# Patient Record
Sex: Male | Born: 1950
Health system: Southern US, Community
[De-identification: ages and names within clinical notes are randomized; demographics above are authoritative.]

## PROBLEM LIST (undated history)

## (undated) DIAGNOSIS — I10 Essential (primary) hypertension: Secondary | ICD-10-CM

## (undated) DIAGNOSIS — F1011 Alcohol abuse, in remission: Secondary | ICD-10-CM

## (undated) DIAGNOSIS — E538 Deficiency of other specified B group vitamins: Secondary | ICD-10-CM

## (undated) DIAGNOSIS — I471 Supraventricular tachycardia, unspecified: Secondary | ICD-10-CM

## (undated) DIAGNOSIS — F1910 Other psychoactive substance abuse, uncomplicated: Secondary | ICD-10-CM

## (undated) DIAGNOSIS — D649 Anemia, unspecified: Secondary | ICD-10-CM

## (undated) DIAGNOSIS — K279 Peptic ulcer, site unspecified, unspecified as acute or chronic, without hemorrhage or perforation: Secondary | ICD-10-CM

## (undated) DIAGNOSIS — R160 Hepatomegaly, not elsewhere classified: Secondary | ICD-10-CM

## (undated) DIAGNOSIS — M25569 Pain in unspecified knee: Secondary | ICD-10-CM

## (undated) DIAGNOSIS — K219 Gastro-esophageal reflux disease without esophagitis: Secondary | ICD-10-CM

## (undated) DIAGNOSIS — D3A092 Benign carcinoid tumor of the stomach: Secondary | ICD-10-CM

## (undated) DIAGNOSIS — Z8739 Personal history of other diseases of the musculoskeletal system and connective tissue: Secondary | ICD-10-CM

## (undated) DIAGNOSIS — E039 Hypothyroidism, unspecified: Secondary | ICD-10-CM

## (undated) DIAGNOSIS — G8929 Other chronic pain: Secondary | ICD-10-CM

## (undated) DIAGNOSIS — K5730 Diverticulosis of large intestine without perforation or abscess without bleeding: Secondary | ICD-10-CM

## (undated) HISTORY — DX: Diverticulosis of large intestine without perforation or abscess without bleeding: K57.30

## (undated) HISTORY — DX: Hepatomegaly, not elsewhere classified: R16.0

## (undated) HISTORY — DX: Deficiency of other specified B group vitamins: E53.8

## (undated) HISTORY — DX: Gastro-esophageal reflux disease without esophagitis: K21.9

## (undated) HISTORY — PX: JOINT REPLACEMENT: SHX530

## (undated) HISTORY — DX: Supraventricular tachycardia, unspecified: I47.10

## (undated) HISTORY — PX: HAND SURGERY: SHX662

## (undated) HISTORY — DX: Other psychoactive substance abuse, uncomplicated: F19.10

## (undated) HISTORY — PX: COLONOSCOPY: SHX174

## (undated) HISTORY — DX: Hypothyroidism, unspecified: E03.9

## (undated) HISTORY — DX: Alcohol abuse, in remission: F10.11

## (undated) HISTORY — PX: CHOLECYSTECTOMY: SHX55

## (undated) HISTORY — DX: Essential (primary) hypertension: I10

## (undated) HISTORY — DX: Anemia, unspecified: D64.9

## (undated) HISTORY — DX: Peptic ulcer, site unspecified, unspecified as acute or chronic, without hemorrhage or perforation: K27.9

## (undated) HISTORY — DX: Personal history of other diseases of the musculoskeletal system and connective tissue: Z87.39

## (undated) HISTORY — DX: Benign carcinoid tumor of the stomach: D3A.092

## (undated) HISTORY — PX: REPLACEMENT TOTAL KNEE BILATERAL: SUR1225

---

## 2001-01-19 ENCOUNTER — Inpatient Hospital Stay (HOSPITAL_COMMUNITY): Admission: EM | Admit: 2001-01-19 | Discharge: 2001-01-21 | Payer: Self-pay | Admitting: *Deleted

## 2001-01-20 ENCOUNTER — Encounter: Payer: Self-pay | Admitting: Family Medicine

## 2001-01-24 ENCOUNTER — Encounter: Payer: Self-pay | Admitting: Family Medicine

## 2001-01-24 ENCOUNTER — Observation Stay (HOSPITAL_COMMUNITY): Admission: AD | Admit: 2001-01-24 | Discharge: 2001-01-25 | Payer: Self-pay | Admitting: Family Medicine

## 2001-04-22 ENCOUNTER — Emergency Department (HOSPITAL_COMMUNITY): Admission: EM | Admit: 2001-04-22 | Discharge: 2001-04-22 | Payer: Self-pay | Admitting: Emergency Medicine

## 2001-05-31 ENCOUNTER — Emergency Department (HOSPITAL_COMMUNITY): Admission: EM | Admit: 2001-05-31 | Discharge: 2001-05-31 | Payer: Self-pay | Admitting: *Deleted

## 2001-05-31 ENCOUNTER — Encounter: Payer: Self-pay | Admitting: *Deleted

## 2001-10-13 ENCOUNTER — Emergency Department (HOSPITAL_COMMUNITY): Admission: EM | Admit: 2001-10-13 | Discharge: 2001-10-13 | Payer: Self-pay | Admitting: Internal Medicine

## 2001-10-13 ENCOUNTER — Encounter: Payer: Self-pay | Admitting: Emergency Medicine

## 2001-11-29 ENCOUNTER — Emergency Department (HOSPITAL_COMMUNITY): Admission: EM | Admit: 2001-11-29 | Discharge: 2001-11-30 | Payer: Self-pay | Admitting: *Deleted

## 2001-12-31 ENCOUNTER — Emergency Department (HOSPITAL_COMMUNITY): Admission: EM | Admit: 2001-12-31 | Discharge: 2001-12-31 | Payer: Self-pay | Admitting: Emergency Medicine

## 2002-01-01 ENCOUNTER — Emergency Department (HOSPITAL_COMMUNITY): Admission: EM | Admit: 2002-01-01 | Discharge: 2002-01-01 | Payer: Self-pay | Admitting: *Deleted

## 2002-01-07 ENCOUNTER — Emergency Department (HOSPITAL_COMMUNITY): Admission: EM | Admit: 2002-01-07 | Discharge: 2002-01-07 | Payer: Self-pay | Admitting: *Deleted

## 2002-01-14 ENCOUNTER — Encounter: Payer: Self-pay | Admitting: *Deleted

## 2002-01-14 ENCOUNTER — Emergency Department (HOSPITAL_COMMUNITY): Admission: EM | Admit: 2002-01-14 | Discharge: 2002-01-14 | Payer: Self-pay | Admitting: *Deleted

## 2002-03-05 ENCOUNTER — Inpatient Hospital Stay (HOSPITAL_COMMUNITY): Admission: EM | Admit: 2002-03-05 | Discharge: 2002-03-08 | Payer: Self-pay | Admitting: Emergency Medicine

## 2002-05-01 ENCOUNTER — Encounter: Payer: Self-pay | Admitting: Emergency Medicine

## 2002-05-01 ENCOUNTER — Emergency Department (HOSPITAL_COMMUNITY): Admission: EM | Admit: 2002-05-01 | Discharge: 2002-05-01 | Payer: Self-pay | Admitting: *Deleted

## 2002-05-18 ENCOUNTER — Encounter: Payer: Self-pay | Admitting: Orthopaedic Surgery

## 2002-05-18 ENCOUNTER — Inpatient Hospital Stay (HOSPITAL_COMMUNITY): Admission: RE | Admit: 2002-05-18 | Discharge: 2002-05-20 | Payer: Self-pay | Admitting: Orthopaedic Surgery

## 2002-06-21 ENCOUNTER — Encounter: Payer: Self-pay | Admitting: Emergency Medicine

## 2002-06-21 ENCOUNTER — Inpatient Hospital Stay (HOSPITAL_COMMUNITY): Admission: EM | Admit: 2002-06-21 | Discharge: 2002-06-23 | Payer: Self-pay | Admitting: Emergency Medicine

## 2002-06-22 ENCOUNTER — Encounter: Payer: Self-pay | Admitting: Family Medicine

## 2002-06-27 ENCOUNTER — Observation Stay (HOSPITAL_COMMUNITY): Admission: RE | Admit: 2002-06-27 | Discharge: 2002-06-28 | Payer: Self-pay | Admitting: General Surgery

## 2002-12-08 ENCOUNTER — Emergency Department (HOSPITAL_COMMUNITY): Admission: EM | Admit: 2002-12-08 | Discharge: 2002-12-08 | Payer: Self-pay | Admitting: Emergency Medicine

## 2003-01-04 ENCOUNTER — Emergency Department (HOSPITAL_COMMUNITY): Admission: EM | Admit: 2003-01-04 | Discharge: 2003-01-04 | Payer: Self-pay | Admitting: Internal Medicine

## 2003-01-04 ENCOUNTER — Encounter: Payer: Self-pay | Admitting: Internal Medicine

## 2003-01-04 ENCOUNTER — Ambulatory Visit (HOSPITAL_COMMUNITY): Admission: RE | Admit: 2003-01-04 | Discharge: 2003-01-04 | Payer: Self-pay | Admitting: Internal Medicine

## 2003-01-07 ENCOUNTER — Emergency Department (HOSPITAL_COMMUNITY): Admission: EM | Admit: 2003-01-07 | Discharge: 2003-01-07 | Payer: Self-pay | Admitting: *Deleted

## 2003-03-10 ENCOUNTER — Encounter: Payer: Self-pay | Admitting: Emergency Medicine

## 2003-03-10 ENCOUNTER — Emergency Department (HOSPITAL_COMMUNITY): Admission: EM | Admit: 2003-03-10 | Discharge: 2003-03-10 | Payer: Self-pay | Admitting: Emergency Medicine

## 2003-03-11 ENCOUNTER — Emergency Department (HOSPITAL_COMMUNITY): Admission: EM | Admit: 2003-03-11 | Discharge: 2003-03-11 | Payer: Self-pay | Admitting: Internal Medicine

## 2003-03-12 ENCOUNTER — Ambulatory Visit (HOSPITAL_COMMUNITY): Admission: RE | Admit: 2003-03-12 | Discharge: 2003-03-12 | Payer: Self-pay | Admitting: Emergency Medicine

## 2003-03-12 ENCOUNTER — Encounter: Payer: Self-pay | Admitting: Emergency Medicine

## 2003-06-09 ENCOUNTER — Emergency Department (HOSPITAL_COMMUNITY): Admission: EM | Admit: 2003-06-09 | Discharge: 2003-06-09 | Payer: Self-pay | Admitting: Emergency Medicine

## 2003-06-22 ENCOUNTER — Emergency Department (HOSPITAL_COMMUNITY): Admission: EM | Admit: 2003-06-22 | Discharge: 2003-06-22 | Payer: Self-pay | Admitting: Emergency Medicine

## 2003-07-05 ENCOUNTER — Emergency Department (HOSPITAL_COMMUNITY): Admission: EM | Admit: 2003-07-05 | Discharge: 2003-07-05 | Payer: Self-pay | Admitting: Emergency Medicine

## 2003-09-02 ENCOUNTER — Emergency Department (HOSPITAL_COMMUNITY): Admission: EM | Admit: 2003-09-02 | Discharge: 2003-09-02 | Payer: Self-pay | Admitting: Emergency Medicine

## 2003-09-23 ENCOUNTER — Emergency Department (HOSPITAL_COMMUNITY): Admission: EM | Admit: 2003-09-23 | Discharge: 2003-09-23 | Payer: Self-pay | Admitting: Emergency Medicine

## 2003-11-04 ENCOUNTER — Emergency Department (HOSPITAL_COMMUNITY): Admission: EM | Admit: 2003-11-04 | Discharge: 2003-11-04 | Payer: Self-pay | Admitting: Emergency Medicine

## 2003-11-11 ENCOUNTER — Inpatient Hospital Stay (HOSPITAL_COMMUNITY): Admission: EM | Admit: 2003-11-11 | Discharge: 2003-11-14 | Payer: Self-pay | Admitting: Emergency Medicine

## 2003-11-19 ENCOUNTER — Ambulatory Visit (HOSPITAL_COMMUNITY): Admission: RE | Admit: 2003-11-19 | Discharge: 2003-11-19 | Payer: Self-pay | Admitting: Orthopaedic Surgery

## 2004-04-16 ENCOUNTER — Emergency Department (HOSPITAL_COMMUNITY): Admission: EM | Admit: 2004-04-16 | Discharge: 2004-04-16 | Payer: Self-pay | Admitting: Emergency Medicine

## 2004-04-17 ENCOUNTER — Emergency Department (HOSPITAL_COMMUNITY): Admission: EM | Admit: 2004-04-17 | Discharge: 2004-04-17 | Payer: Self-pay | Admitting: Emergency Medicine

## 2004-04-21 ENCOUNTER — Ambulatory Visit (HOSPITAL_COMMUNITY): Admission: RE | Admit: 2004-04-21 | Discharge: 2004-04-21 | Payer: Self-pay | Admitting: Family Medicine

## 2004-04-26 ENCOUNTER — Inpatient Hospital Stay (HOSPITAL_COMMUNITY): Admission: EM | Admit: 2004-04-26 | Discharge: 2004-04-28 | Payer: Self-pay | Admitting: *Deleted

## 2004-06-16 ENCOUNTER — Inpatient Hospital Stay (HOSPITAL_COMMUNITY): Admission: EM | Admit: 2004-06-16 | Discharge: 2004-06-24 | Payer: Self-pay | Admitting: Emergency Medicine

## 2004-06-24 ENCOUNTER — Inpatient Hospital Stay (HOSPITAL_COMMUNITY)
Admission: RE | Admit: 2004-06-24 | Discharge: 2004-07-09 | Payer: Self-pay | Admitting: Physical Medicine & Rehabilitation

## 2004-06-24 ENCOUNTER — Ambulatory Visit: Payer: Self-pay | Admitting: Physical Medicine & Rehabilitation

## 2004-07-14 ENCOUNTER — Ambulatory Visit: Payer: Self-pay | Admitting: Orthopedic Surgery

## 2004-08-05 ENCOUNTER — Ambulatory Visit: Payer: Self-pay | Admitting: Orthopedic Surgery

## 2004-09-17 ENCOUNTER — Ambulatory Visit: Payer: Self-pay | Admitting: Orthopedic Surgery

## 2004-10-20 ENCOUNTER — Ambulatory Visit: Payer: Self-pay | Admitting: Orthopedic Surgery

## 2005-01-19 ENCOUNTER — Ambulatory Visit: Payer: Self-pay | Admitting: Orthopedic Surgery

## 2005-03-09 ENCOUNTER — Ambulatory Visit: Payer: Self-pay | Admitting: Orthopedic Surgery

## 2005-06-17 ENCOUNTER — Ambulatory Visit: Payer: Self-pay | Admitting: Orthopedic Surgery

## 2005-06-20 ENCOUNTER — Encounter: Payer: Self-pay | Admitting: Orthopedic Surgery

## 2005-09-26 ENCOUNTER — Emergency Department (HOSPITAL_COMMUNITY): Admission: EM | Admit: 2005-09-26 | Discharge: 2005-09-26 | Payer: Self-pay | Admitting: Emergency Medicine

## 2005-12-13 ENCOUNTER — Emergency Department (HOSPITAL_COMMUNITY): Admission: EM | Admit: 2005-12-13 | Discharge: 2005-12-13 | Payer: Self-pay | Admitting: Emergency Medicine

## 2006-01-13 ENCOUNTER — Ambulatory Visit: Payer: Self-pay | Admitting: Orthopedic Surgery

## 2006-02-10 ENCOUNTER — Ambulatory Visit: Payer: Self-pay | Admitting: Orthopedic Surgery

## 2006-02-16 ENCOUNTER — Ambulatory Visit: Payer: Self-pay | Admitting: Orthopedic Surgery

## 2006-02-16 ENCOUNTER — Ambulatory Visit (HOSPITAL_COMMUNITY): Admission: RE | Admit: 2006-02-16 | Discharge: 2006-02-16 | Payer: Self-pay | Admitting: Orthopedic Surgery

## 2006-02-22 ENCOUNTER — Ambulatory Visit: Payer: Self-pay | Admitting: Orthopedic Surgery

## 2006-03-04 ENCOUNTER — Ambulatory Visit: Payer: Self-pay | Admitting: Orthopedic Surgery

## 2006-03-09 ENCOUNTER — Inpatient Hospital Stay (HOSPITAL_COMMUNITY): Admission: EM | Admit: 2006-03-09 | Discharge: 2006-03-11 | Payer: Self-pay | Admitting: Emergency Medicine

## 2006-03-17 ENCOUNTER — Ambulatory Visit: Payer: Self-pay | Admitting: Family Medicine

## 2006-03-18 ENCOUNTER — Encounter (INDEPENDENT_AMBULATORY_CARE_PROVIDER_SITE_OTHER): Payer: Self-pay | Admitting: Family Medicine

## 2006-03-18 LAB — CONVERTED CEMR LAB
Albumin: 3.6 g/dL
Alkaline Phosphatase: 73 U/L
BUN: 13 mg/dL
Calcium: 8.9 mg/dL
Chloride: 104 meq/L
Cholesterol: 123 mg/dL
Creatinine, Ser: 1.07 mg/dL
HDL: 44 mg/dL
LDL Cholesterol: 62 mg/dL
Potassium: 3.8 meq/L
Sodium: 141 meq/L
TSH: 5.058 u[IU]/mL
Total Bilirubin: 0.6 mg/dL
Total Protein: 7.3 g/dL
Triglycerides: 86 mg/dL

## 2006-04-01 ENCOUNTER — Ambulatory Visit: Payer: Self-pay | Admitting: Family Medicine

## 2006-04-02 ENCOUNTER — Ambulatory Visit (HOSPITAL_COMMUNITY): Admission: RE | Admit: 2006-04-02 | Discharge: 2006-04-02 | Payer: Self-pay | Admitting: Family Medicine

## 2006-04-05 ENCOUNTER — Ambulatory Visit: Payer: Self-pay | Admitting: Orthopedic Surgery

## 2006-04-15 ENCOUNTER — Ambulatory Visit (HOSPITAL_COMMUNITY): Admission: RE | Admit: 2006-04-15 | Discharge: 2006-04-15 | Payer: Self-pay | Admitting: Orthopedic Surgery

## 2006-04-16 ENCOUNTER — Ambulatory Visit: Payer: Self-pay | Admitting: Family Medicine

## 2006-04-19 ENCOUNTER — Ambulatory Visit (HOSPITAL_COMMUNITY): Admission: RE | Admit: 2006-04-19 | Discharge: 2006-04-19 | Payer: Self-pay | Admitting: *Deleted

## 2006-05-11 ENCOUNTER — Ambulatory Visit (HOSPITAL_COMMUNITY): Admission: RE | Admit: 2006-05-11 | Discharge: 2006-05-11 | Payer: Self-pay | Admitting: *Deleted

## 2006-05-28 ENCOUNTER — Ambulatory Visit: Payer: Self-pay | Admitting: Family Medicine

## 2006-06-13 ENCOUNTER — Emergency Department (HOSPITAL_COMMUNITY): Admission: EM | Admit: 2006-06-13 | Discharge: 2006-06-13 | Payer: Self-pay | Admitting: Emergency Medicine

## 2006-06-17 ENCOUNTER — Ambulatory Visit: Payer: Self-pay | Admitting: Family Medicine

## 2006-06-18 ENCOUNTER — Encounter (INDEPENDENT_AMBULATORY_CARE_PROVIDER_SITE_OTHER): Payer: Self-pay | Admitting: Family Medicine

## 2006-06-18 LAB — CONVERTED CEMR LAB
HCT: 45.6 %
Hemoglobin: 15.1 g/dL
RBC count: 5.08 10*6/uL
RBC: 5.08 M/uL
WBC: 6.5 10*3/uL

## 2006-06-23 ENCOUNTER — Ambulatory Visit: Payer: Self-pay | Admitting: Orthopedic Surgery

## 2006-06-25 ENCOUNTER — Ambulatory Visit: Payer: Self-pay | Admitting: Family Medicine

## 2006-06-29 ENCOUNTER — Inpatient Hospital Stay (HOSPITAL_COMMUNITY): Admission: RE | Admit: 2006-06-29 | Discharge: 2006-07-06 | Payer: Self-pay | Admitting: Orthopedic Surgery

## 2006-06-29 ENCOUNTER — Ambulatory Visit: Payer: Self-pay | Admitting: Orthopedic Surgery

## 2006-06-29 ENCOUNTER — Encounter (INDEPENDENT_AMBULATORY_CARE_PROVIDER_SITE_OTHER): Payer: Self-pay | Admitting: Specialist

## 2006-07-19 ENCOUNTER — Ambulatory Visit: Payer: Self-pay | Admitting: Orthopedic Surgery

## 2006-08-02 ENCOUNTER — Ambulatory Visit: Payer: Self-pay | Admitting: Orthopedic Surgery

## 2006-08-04 ENCOUNTER — Inpatient Hospital Stay (HOSPITAL_COMMUNITY): Admission: EM | Admit: 2006-08-04 | Discharge: 2006-08-11 | Payer: Self-pay | Admitting: Emergency Medicine

## 2006-08-06 ENCOUNTER — Ambulatory Visit: Payer: Self-pay | Admitting: Orthopedic Surgery

## 2006-08-12 ENCOUNTER — Ambulatory Visit: Payer: Self-pay | Admitting: Family Medicine

## 2006-08-15 ENCOUNTER — Encounter: Payer: Self-pay | Admitting: Family Medicine

## 2006-08-15 DIAGNOSIS — K589 Irritable bowel syndrome without diarrhea: Secondary | ICD-10-CM

## 2006-08-15 DIAGNOSIS — E538 Deficiency of other specified B group vitamins: Secondary | ICD-10-CM | POA: Insufficient documentation

## 2006-08-15 DIAGNOSIS — K5730 Diverticulosis of large intestine without perforation or abscess without bleeding: Secondary | ICD-10-CM | POA: Insufficient documentation

## 2006-08-15 DIAGNOSIS — N400 Enlarged prostate without lower urinary tract symptoms: Secondary | ICD-10-CM

## 2006-08-15 DIAGNOSIS — K5900 Constipation, unspecified: Secondary | ICD-10-CM | POA: Insufficient documentation

## 2006-08-15 DIAGNOSIS — IMO0002 Reserved for concepts with insufficient information to code with codable children: Secondary | ICD-10-CM | POA: Insufficient documentation

## 2006-08-15 DIAGNOSIS — K219 Gastro-esophageal reflux disease without esophagitis: Secondary | ICD-10-CM | POA: Insufficient documentation

## 2006-08-15 DIAGNOSIS — M1990 Unspecified osteoarthritis, unspecified site: Secondary | ICD-10-CM | POA: Insufficient documentation

## 2006-08-15 DIAGNOSIS — D179 Benign lipomatous neoplasm, unspecified: Secondary | ICD-10-CM | POA: Insufficient documentation

## 2006-08-16 ENCOUNTER — Ambulatory Visit: Payer: Self-pay | Admitting: Orthopedic Surgery

## 2006-09-02 ENCOUNTER — Ambulatory Visit: Payer: Self-pay | Admitting: Orthopedic Surgery

## 2006-09-09 ENCOUNTER — Ambulatory Visit: Payer: Self-pay | Admitting: Orthopedic Surgery

## 2006-09-22 ENCOUNTER — Ambulatory Visit: Payer: Self-pay | Admitting: Family Medicine

## 2006-10-06 ENCOUNTER — Ambulatory Visit: Payer: Self-pay | Admitting: Orthopedic Surgery

## 2006-10-13 ENCOUNTER — Emergency Department (HOSPITAL_COMMUNITY): Admission: EM | Admit: 2006-10-13 | Discharge: 2006-10-13 | Payer: Self-pay | Admitting: Emergency Medicine

## 2006-10-20 ENCOUNTER — Ambulatory Visit: Payer: Self-pay | Admitting: Family Medicine

## 2006-10-20 DIAGNOSIS — I10 Essential (primary) hypertension: Secondary | ICD-10-CM

## 2006-12-01 ENCOUNTER — Encounter (INDEPENDENT_AMBULATORY_CARE_PROVIDER_SITE_OTHER): Payer: Self-pay | Admitting: Family Medicine

## 2006-12-07 ENCOUNTER — Emergency Department (HOSPITAL_COMMUNITY): Admission: EM | Admit: 2006-12-07 | Discharge: 2006-12-07 | Payer: Self-pay | Admitting: Emergency Medicine

## 2006-12-10 ENCOUNTER — Emergency Department (HOSPITAL_COMMUNITY): Admission: EM | Admit: 2006-12-10 | Discharge: 2006-12-10 | Payer: Self-pay | Admitting: Emergency Medicine

## 2006-12-21 ENCOUNTER — Ambulatory Visit: Payer: Self-pay | Admitting: Family Medicine

## 2006-12-30 ENCOUNTER — Inpatient Hospital Stay (HOSPITAL_COMMUNITY): Admission: EM | Admit: 2006-12-30 | Discharge: 2007-01-03 | Payer: Self-pay | Admitting: Emergency Medicine

## 2006-12-30 ENCOUNTER — Ambulatory Visit: Payer: Self-pay | Admitting: Orthopedic Surgery

## 2006-12-30 ENCOUNTER — Ambulatory Visit: Payer: Self-pay | Admitting: Cardiology

## 2007-01-01 ENCOUNTER — Encounter: Payer: Self-pay | Admitting: Orthopedic Surgery

## 2007-01-06 ENCOUNTER — Ambulatory Visit: Payer: Self-pay | Admitting: Family Medicine

## 2007-01-09 ENCOUNTER — Emergency Department (HOSPITAL_COMMUNITY): Admission: EM | Admit: 2007-01-09 | Discharge: 2007-01-09 | Payer: Self-pay | Admitting: Emergency Medicine

## 2007-01-11 ENCOUNTER — Ambulatory Visit: Payer: Self-pay | Admitting: Orthopedic Surgery

## 2007-01-20 ENCOUNTER — Ambulatory Visit: Payer: Self-pay | Admitting: Family Medicine

## 2007-02-02 ENCOUNTER — Ambulatory Visit: Payer: Self-pay | Admitting: Orthopedic Surgery

## 2007-02-16 ENCOUNTER — Encounter (INDEPENDENT_AMBULATORY_CARE_PROVIDER_SITE_OTHER): Payer: Self-pay | Admitting: Family Medicine

## 2007-02-17 ENCOUNTER — Ambulatory Visit: Payer: Self-pay | Admitting: Family Medicine

## 2007-03-03 ENCOUNTER — Ambulatory Visit: Payer: Self-pay | Admitting: Orthopedic Surgery

## 2007-03-09 ENCOUNTER — Encounter (INDEPENDENT_AMBULATORY_CARE_PROVIDER_SITE_OTHER): Payer: Self-pay | Admitting: Family Medicine

## 2007-03-13 ENCOUNTER — Inpatient Hospital Stay (HOSPITAL_COMMUNITY): Admission: EM | Admit: 2007-03-13 | Discharge: 2007-03-17 | Payer: Self-pay | Admitting: Hematology and Oncology

## 2007-03-14 ENCOUNTER — Encounter: Payer: Self-pay | Admitting: Internal Medicine

## 2007-03-14 ENCOUNTER — Ambulatory Visit: Payer: Self-pay | Admitting: Pulmonary Disease

## 2007-03-16 ENCOUNTER — Encounter (INDEPENDENT_AMBULATORY_CARE_PROVIDER_SITE_OTHER): Payer: Self-pay | Admitting: Family Medicine

## 2007-03-16 ENCOUNTER — Encounter: Payer: Self-pay | Admitting: Gastroenterology

## 2007-03-18 ENCOUNTER — Ambulatory Visit: Payer: Self-pay | Admitting: Gastroenterology

## 2007-03-18 ENCOUNTER — Encounter (INDEPENDENT_AMBULATORY_CARE_PROVIDER_SITE_OTHER): Payer: Self-pay | Admitting: Family Medicine

## 2007-03-22 ENCOUNTER — Ambulatory Visit: Payer: Self-pay | Admitting: Family Medicine

## 2007-03-22 ENCOUNTER — Telehealth (INDEPENDENT_AMBULATORY_CARE_PROVIDER_SITE_OTHER): Payer: Self-pay | Admitting: *Deleted

## 2007-03-22 DIAGNOSIS — K922 Gastrointestinal hemorrhage, unspecified: Secondary | ICD-10-CM | POA: Insufficient documentation

## 2007-03-23 ENCOUNTER — Ambulatory Visit: Payer: Self-pay | Admitting: Orthopedic Surgery

## 2007-03-23 ENCOUNTER — Telehealth (INDEPENDENT_AMBULATORY_CARE_PROVIDER_SITE_OTHER): Payer: Self-pay | Admitting: *Deleted

## 2007-03-23 ENCOUNTER — Encounter (INDEPENDENT_AMBULATORY_CARE_PROVIDER_SITE_OTHER): Payer: Self-pay | Admitting: Family Medicine

## 2007-03-23 LAB — CONVERTED CEMR LAB
Basophils Absolute: 0.1 10*3/uL (ref 0.0–0.1)
CO2: 22 meq/L (ref 19–32)
Chloride: 107 meq/L (ref 96–112)
Creatinine, Ser: 1.41 mg/dL (ref 0.40–1.50)
Hemoglobin: 9.4 g/dL — ABNORMAL LOW (ref 13.0–17.0)
Lymphocytes Relative: 21 % (ref 12–46)
Lymphs Abs: 1.4 10*3/uL (ref 0.7–3.3)
Monocytes Absolute: 0.8 10*3/uL — ABNORMAL HIGH (ref 0.2–0.7)
Neutro Abs: 4.4 10*3/uL (ref 1.7–7.7)
RBC: 3.22 M/uL — ABNORMAL LOW (ref 4.22–5.81)
RDW: 16.9 % — ABNORMAL HIGH (ref 11.5–14.0)
Sodium: 139 meq/L (ref 135–145)
WBC: 6.9 10*3/uL (ref 4.0–10.5)

## 2007-03-25 ENCOUNTER — Ambulatory Visit: Payer: Self-pay | Admitting: Family Medicine

## 2007-03-28 ENCOUNTER — Encounter (INDEPENDENT_AMBULATORY_CARE_PROVIDER_SITE_OTHER): Payer: Self-pay | Admitting: Family Medicine

## 2007-03-28 ENCOUNTER — Ambulatory Visit: Payer: Self-pay | Admitting: Orthopedic Surgery

## 2007-03-30 ENCOUNTER — Ambulatory Visit: Payer: Self-pay | Admitting: Family Medicine

## 2007-03-30 ENCOUNTER — Ambulatory Visit: Payer: Self-pay | Admitting: Orthopedic Surgery

## 2007-04-04 ENCOUNTER — Ambulatory Visit: Payer: Self-pay | Admitting: Orthopedic Surgery

## 2007-04-06 ENCOUNTER — Ambulatory Visit: Payer: Self-pay | Admitting: Orthopedic Surgery

## 2007-04-15 ENCOUNTER — Ambulatory Visit: Payer: Self-pay | Admitting: Gastroenterology

## 2007-04-15 ENCOUNTER — Encounter (INDEPENDENT_AMBULATORY_CARE_PROVIDER_SITE_OTHER): Payer: Self-pay | Admitting: Family Medicine

## 2007-04-20 ENCOUNTER — Ambulatory Visit: Payer: Self-pay | Admitting: Family Medicine

## 2007-04-20 ENCOUNTER — Ambulatory Visit: Payer: Self-pay | Admitting: Orthopedic Surgery

## 2007-04-21 ENCOUNTER — Encounter (INDEPENDENT_AMBULATORY_CARE_PROVIDER_SITE_OTHER): Payer: Self-pay | Admitting: Family Medicine

## 2007-04-21 ENCOUNTER — Telehealth (INDEPENDENT_AMBULATORY_CARE_PROVIDER_SITE_OTHER): Payer: Self-pay | Admitting: *Deleted

## 2007-04-21 ENCOUNTER — Ambulatory Visit: Payer: Self-pay | Admitting: Orthopedic Surgery

## 2007-04-21 LAB — CONVERTED CEMR LAB
BUN: 21 mg/dL
Basophils Absolute: 0 10*3/uL
Basophils Relative: 0 %
CO2: 21 meq/L
Calcium: 8.9 mg/dL
Chloride: 106 meq/L
Creatinine, Ser: 1.22 mg/dL
Eosinophils Absolute: 0.3 10*3/uL
Eosinophils Relative: 5 %
Glucose, Bld: 88 mg/dL
HCT: 35.4 % — ABNORMAL LOW
Hemoglobin: 11.1 g/dL — ABNORMAL LOW
Lymphocytes Relative: 21 %
Lymphs Abs: 1.4 10*3/uL
MCHC: 31.4 g/dL
MCV: 91.9 fL
Monocytes Absolute: 0.4 10*3/uL
Monocytes Relative: 6 %
Neutro Abs: 4.5 10*3/uL
Neutrophils Relative %: 69 %
Platelets: 227 10*3/uL
Potassium: 4.4 meq/L
RBC: 3.85 M/uL — ABNORMAL LOW
RDW: 14.6 % — ABNORMAL HIGH
Sodium: 142 meq/L
WBC: 6.6 10*3/uL

## 2007-04-28 ENCOUNTER — Ambulatory Visit: Payer: Self-pay | Admitting: Orthopedic Surgery

## 2007-05-19 ENCOUNTER — Ambulatory Visit: Payer: Self-pay | Admitting: Orthopedic Surgery

## 2007-05-26 ENCOUNTER — Encounter (INDEPENDENT_AMBULATORY_CARE_PROVIDER_SITE_OTHER): Payer: Self-pay | Admitting: Family Medicine

## 2007-06-01 ENCOUNTER — Ambulatory Visit: Payer: Self-pay | Admitting: Family Medicine

## 2007-06-01 LAB — CONVERTED CEMR LAB
Cholesterol, target level: 200 mg/dL
HDL goal, serum: 40 mg/dL
LDL Goal: 130 mg/dL

## 2007-06-02 ENCOUNTER — Encounter (INDEPENDENT_AMBULATORY_CARE_PROVIDER_SITE_OTHER): Payer: Self-pay | Admitting: Family Medicine

## 2007-06-21 ENCOUNTER — Ambulatory Visit: Payer: Self-pay | Admitting: Orthopedic Surgery

## 2007-06-24 ENCOUNTER — Ambulatory Visit (HOSPITAL_COMMUNITY): Admission: RE | Admit: 2007-06-24 | Discharge: 2007-06-24 | Payer: Self-pay | Admitting: Orthopedic Surgery

## 2007-06-24 ENCOUNTER — Ambulatory Visit: Payer: Self-pay | Admitting: Orthopedic Surgery

## 2007-06-28 ENCOUNTER — Ambulatory Visit: Payer: Self-pay | Admitting: Orthopedic Surgery

## 2007-06-30 ENCOUNTER — Ambulatory Visit: Payer: Self-pay | Admitting: Orthopedic Surgery

## 2007-07-04 ENCOUNTER — Ambulatory Visit: Payer: Self-pay | Admitting: Family Medicine

## 2007-07-21 ENCOUNTER — Ambulatory Visit: Payer: Self-pay | Admitting: Orthopedic Surgery

## 2007-08-09 ENCOUNTER — Ambulatory Visit: Payer: Self-pay | Admitting: Family Medicine

## 2007-08-10 ENCOUNTER — Encounter: Payer: Self-pay | Admitting: Orthopedic Surgery

## 2007-08-22 ENCOUNTER — Ambulatory Visit: Payer: Self-pay | Admitting: Orthopedic Surgery

## 2007-08-24 ENCOUNTER — Ambulatory Visit: Payer: Self-pay | Admitting: Family Medicine

## 2007-08-24 LAB — CONVERTED CEMR LAB
AST: 19 units/L (ref 0–37)
Albumin: 3.6 g/dL (ref 3.5–5.2)
Alkaline Phosphatase: 69 units/L (ref 39–117)
BUN: 38 mg/dL — ABNORMAL HIGH (ref 6–23)
Basophils Relative: 0 % (ref 0–1)
Eosinophils Absolute: 0 10*3/uL — ABNORMAL LOW (ref 0.2–0.7)
MCHC: 33.3 g/dL (ref 30.0–36.0)
MCV: 83.6 fL (ref 78.0–100.0)
Monocytes Relative: 16 % — ABNORMAL HIGH (ref 3–12)
Neutrophils Relative %: 52 % (ref 43–77)
Platelets: 154 10*3/uL (ref 150–400)
Potassium: 4.4 meq/L (ref 3.5–5.3)
Sodium: 135 meq/L (ref 135–145)
Total Bilirubin: 0.5 mg/dL (ref 0.3–1.2)

## 2007-08-25 ENCOUNTER — Ambulatory Visit: Payer: Self-pay | Admitting: Orthopedic Surgery

## 2007-08-25 ENCOUNTER — Ambulatory Visit: Payer: Self-pay | Admitting: Cardiology

## 2007-08-25 ENCOUNTER — Inpatient Hospital Stay (HOSPITAL_COMMUNITY): Admission: EM | Admit: 2007-08-25 | Discharge: 2007-09-08 | Payer: Self-pay | Admitting: Emergency Medicine

## 2007-09-01 ENCOUNTER — Encounter: Payer: Self-pay | Admitting: Family Medicine

## 2007-09-12 ENCOUNTER — Telehealth: Payer: Self-pay | Admitting: Orthopedic Surgery

## 2007-09-14 ENCOUNTER — Ambulatory Visit: Payer: Self-pay | Admitting: Orthopedic Surgery

## 2007-09-19 ENCOUNTER — Ambulatory Visit (HOSPITAL_COMMUNITY): Admission: RE | Admit: 2007-09-19 | Discharge: 2007-09-19 | Payer: Self-pay | Admitting: *Deleted

## 2007-09-23 ENCOUNTER — Ambulatory Visit: Payer: Self-pay | Admitting: Family Medicine

## 2007-09-24 ENCOUNTER — Encounter (INDEPENDENT_AMBULATORY_CARE_PROVIDER_SITE_OTHER): Payer: Self-pay | Admitting: Family Medicine

## 2007-09-24 LAB — CONVERTED CEMR LAB
ALT: 19 units/L (ref 0–53)
AST: 21 units/L (ref 0–37)
Alkaline Phosphatase: 70 units/L (ref 39–117)
BUN: 26 mg/dL — ABNORMAL HIGH (ref 6–23)
Basophils Absolute: 0 10*3/uL (ref 0.0–0.1)
Basophils Relative: 1 % (ref 0–1)
Chloride: 109 meq/L (ref 96–112)
Creatinine, Ser: 1.85 mg/dL — ABNORMAL HIGH (ref 0.40–1.50)
Eosinophils Absolute: 0.1 10*3/uL (ref 0.0–0.7)
Hemoglobin: 11.1 g/dL — ABNORMAL LOW (ref 13.0–17.0)
MCHC: 32 g/dL (ref 30.0–36.0)
MCV: 88.5 fL (ref 78.0–100.0)
Monocytes Absolute: 0.6 10*3/uL (ref 0.1–1.0)
Monocytes Relative: 7 % (ref 3–12)
Neutro Abs: 5.9 10*3/uL (ref 1.7–7.7)
Neutrophils Relative %: 73 % (ref 43–77)
RBC: 3.92 M/uL — ABNORMAL LOW (ref 4.22–5.81)
RDW: 17.7 % — ABNORMAL HIGH (ref 11.5–15.5)

## 2007-09-26 ENCOUNTER — Telehealth (INDEPENDENT_AMBULATORY_CARE_PROVIDER_SITE_OTHER): Payer: Self-pay | Admitting: *Deleted

## 2007-09-27 ENCOUNTER — Encounter (INDEPENDENT_AMBULATORY_CARE_PROVIDER_SITE_OTHER): Payer: Self-pay | Admitting: Family Medicine

## 2007-09-27 ENCOUNTER — Ambulatory Visit: Payer: Self-pay | Admitting: Gastroenterology

## 2007-09-29 ENCOUNTER — Ambulatory Visit: Payer: Self-pay | Admitting: Orthopedic Surgery

## 2007-09-29 DIAGNOSIS — M109 Gout, unspecified: Secondary | ICD-10-CM | POA: Insufficient documentation

## 2007-10-07 ENCOUNTER — Ambulatory Visit: Payer: Self-pay | Admitting: Family Medicine

## 2007-10-11 ENCOUNTER — Ambulatory Visit: Payer: Self-pay | Admitting: Family Medicine

## 2007-10-12 ENCOUNTER — Ambulatory Visit: Payer: Self-pay | Admitting: Orthopedic Surgery

## 2007-10-21 ENCOUNTER — Ambulatory Visit: Payer: Self-pay | Admitting: Family Medicine

## 2007-11-03 ENCOUNTER — Ambulatory Visit: Payer: Self-pay | Admitting: Orthopedic Surgery

## 2007-11-08 ENCOUNTER — Ambulatory Visit: Payer: Self-pay | Admitting: Family Medicine

## 2007-11-30 ENCOUNTER — Encounter (INDEPENDENT_AMBULATORY_CARE_PROVIDER_SITE_OTHER): Payer: Self-pay | Admitting: Family Medicine

## 2007-11-30 ENCOUNTER — Ambulatory Visit: Payer: Self-pay | Admitting: Gastroenterology

## 2007-11-30 HISTORY — PX: UPPER GASTROINTESTINAL ENDOSCOPY: SHX188

## 2007-12-08 ENCOUNTER — Ambulatory Visit: Payer: Self-pay | Admitting: Gastroenterology

## 2007-12-08 ENCOUNTER — Ambulatory Visit (HOSPITAL_COMMUNITY): Admission: RE | Admit: 2007-12-08 | Discharge: 2007-12-08 | Payer: Self-pay | Admitting: Gastroenterology

## 2007-12-08 ENCOUNTER — Encounter: Payer: Self-pay | Admitting: Gastroenterology

## 2007-12-08 HISTORY — PX: ESOPHAGOGASTRODUODENOSCOPY: SHX1529

## 2008-01-04 ENCOUNTER — Ambulatory Visit: Payer: Self-pay | Admitting: Family Medicine

## 2008-01-04 DIAGNOSIS — D131 Benign neoplasm of stomach: Secondary | ICD-10-CM | POA: Insufficient documentation

## 2008-01-18 ENCOUNTER — Ambulatory Visit: Payer: Self-pay | Admitting: Orthopedic Surgery

## 2008-02-01 ENCOUNTER — Ambulatory Visit: Payer: Self-pay | Admitting: Family Medicine

## 2008-02-01 LAB — CONVERTED CEMR LAB: Hemoglobin: 11.4 g/dL

## 2008-03-06 ENCOUNTER — Ambulatory Visit: Payer: Self-pay | Admitting: Family Medicine

## 2008-03-07 ENCOUNTER — Ambulatory Visit: Payer: Self-pay | Admitting: Family Medicine

## 2008-03-21 ENCOUNTER — Ambulatory Visit: Payer: Self-pay | Admitting: Orthopedic Surgery

## 2008-04-03 ENCOUNTER — Ambulatory Visit: Payer: Self-pay | Admitting: Family Medicine

## 2008-04-04 ENCOUNTER — Ambulatory Visit: Payer: Self-pay | Admitting: Gastroenterology

## 2008-04-04 ENCOUNTER — Encounter (INDEPENDENT_AMBULATORY_CARE_PROVIDER_SITE_OTHER): Payer: Self-pay | Admitting: Family Medicine

## 2008-04-12 ENCOUNTER — Encounter (INDEPENDENT_AMBULATORY_CARE_PROVIDER_SITE_OTHER): Payer: Self-pay | Admitting: Family Medicine

## 2008-04-16 LAB — CONVERTED CEMR LAB
ALT: 31 units/L (ref 0–53)
Albumin: 4.1 g/dL (ref 3.5–5.2)
Alkaline Phosphatase: 75 units/L (ref 39–117)
BUN: 20 mg/dL (ref 6–23)
CO2: 21 meq/L (ref 19–32)
Calcium: 9.2 mg/dL (ref 8.4–10.5)
Eosinophils Absolute: 0.2 10*3/uL (ref 0.0–0.7)
Eosinophils Relative: 4 % (ref 0–5)
Glucose, Bld: 104 mg/dL — ABNORMAL HIGH (ref 70–99)
HCT: 40.3 % (ref 39.0–52.0)
Hemoglobin: 12.9 g/dL — ABNORMAL LOW (ref 13.0–17.0)
LDL Cholesterol: 94 mg/dL (ref 0–99)
Lymphocytes Relative: 38 % (ref 12–46)
Lymphs Abs: 2.1 10*3/uL (ref 0.7–4.0)
MCV: 87.8 fL (ref 78.0–100.0)
Monocytes Absolute: 0.5 10*3/uL (ref 0.1–1.0)
Monocytes Relative: 9 % (ref 3–12)
Platelets: 134 10*3/uL — ABNORMAL LOW (ref 150–400)
RBC: 4.59 M/uL (ref 4.22–5.81)
Sodium: 139 meq/L (ref 135–145)
Total Bilirubin: 1 mg/dL (ref 0.3–1.2)
Total CHOL/HDL Ratio: 3.4
WBC: 5.5 10*3/uL (ref 4.0–10.5)

## 2008-04-24 ENCOUNTER — Ambulatory Visit: Payer: Self-pay | Admitting: Orthopedic Surgery

## 2008-05-15 ENCOUNTER — Ambulatory Visit: Payer: Self-pay | Admitting: Family Medicine

## 2008-05-15 LAB — CONVERTED CEMR LAB
Bilirubin Urine: NEGATIVE
Glucose, Urine, Semiquant: NEGATIVE
Protein, U semiquant: NEGATIVE
pH: 6

## 2008-05-17 ENCOUNTER — Encounter (INDEPENDENT_AMBULATORY_CARE_PROVIDER_SITE_OTHER): Payer: Self-pay | Admitting: Family Medicine

## 2008-05-17 LAB — CONVERTED CEMR LAB
ALT: 40 units/L (ref 0–53)
AST: 41 units/L — ABNORMAL HIGH (ref 0–37)
Alkaline Phosphatase: 72 units/L (ref 39–117)
CO2: 19 meq/L (ref 19–32)
Sodium: 137 meq/L (ref 135–145)
Total Bilirubin: 0.6 mg/dL (ref 0.3–1.2)
Total Protein: 7.6 g/dL (ref 6.0–8.3)

## 2008-05-21 ENCOUNTER — Encounter (INDEPENDENT_AMBULATORY_CARE_PROVIDER_SITE_OTHER): Payer: Self-pay | Admitting: Family Medicine

## 2008-05-23 ENCOUNTER — Ambulatory Visit: Payer: Self-pay | Admitting: Orthopedic Surgery

## 2008-06-19 ENCOUNTER — Ambulatory Visit: Payer: Self-pay | Admitting: Family Medicine

## 2008-06-20 ENCOUNTER — Ambulatory Visit: Payer: Self-pay | Admitting: Orthopedic Surgery

## 2008-07-05 ENCOUNTER — Ambulatory Visit: Payer: Self-pay | Admitting: Gastroenterology

## 2008-07-17 ENCOUNTER — Ambulatory Visit: Payer: Self-pay | Admitting: Family Medicine

## 2008-08-14 ENCOUNTER — Ambulatory Visit: Payer: Self-pay | Admitting: Family Medicine

## 2008-08-15 ENCOUNTER — Encounter (INDEPENDENT_AMBULATORY_CARE_PROVIDER_SITE_OTHER): Payer: Self-pay | Admitting: Family Medicine

## 2008-08-15 LAB — CONVERTED CEMR LAB
Basophils Absolute: 0 10*3/uL (ref 0.0–0.1)
Basophils Relative: 1 % (ref 0–1)
Eosinophils Absolute: 0.3 10*3/uL (ref 0.0–0.7)
Eosinophils Relative: 6 % — ABNORMAL HIGH (ref 0–5)
Folate: >20 ng/mL
HCT: 41.1 % (ref 39.0–52.0)
MCHC: 31.9 g/dL (ref 30.0–36.0)
MCV: 91.1 fL (ref 78.0–100.0)
Neutrophils Relative %: 37 % — ABNORMAL LOW (ref 43–77)
Platelets: 156 10*3/uL (ref 150–400)
RDW: 14.5 % (ref 11.5–15.5)
Vitamin B-12: >2000 pg/mL — ABNORMAL HIGH (ref 211–911)
WBC: 5 10*3/uL (ref 4.0–10.5)

## 2008-08-20 ENCOUNTER — Ambulatory Visit: Payer: Self-pay | Admitting: Orthopedic Surgery

## 2008-08-20 DIAGNOSIS — M1710 Unilateral primary osteoarthritis, unspecified knee: Secondary | ICD-10-CM

## 2008-08-20 DIAGNOSIS — IMO0002 Reserved for concepts with insufficient information to code with codable children: Secondary | ICD-10-CM | POA: Insufficient documentation

## 2008-08-29 ENCOUNTER — Ambulatory Visit (HOSPITAL_COMMUNITY): Admission: RE | Admit: 2008-08-29 | Discharge: 2008-08-29 | Payer: Self-pay | Admitting: Gastroenterology

## 2008-08-30 ENCOUNTER — Telehealth: Payer: Self-pay | Admitting: Orthopedic Surgery

## 2008-09-18 ENCOUNTER — Ambulatory Visit: Payer: Self-pay | Admitting: Orthopedic Surgery

## 2008-09-18 ENCOUNTER — Inpatient Hospital Stay (HOSPITAL_COMMUNITY): Admission: RE | Admit: 2008-09-18 | Discharge: 2008-09-21 | Payer: Self-pay | Admitting: Orthopedic Surgery

## 2008-09-24 ENCOUNTER — Telehealth: Payer: Self-pay | Admitting: Orthopedic Surgery

## 2008-09-24 ENCOUNTER — Encounter: Payer: Self-pay | Admitting: Orthopedic Surgery

## 2008-09-26 ENCOUNTER — Encounter: Payer: Self-pay | Admitting: Orthopedic Surgery

## 2008-09-27 ENCOUNTER — Telehealth: Payer: Self-pay | Admitting: Orthopedic Surgery

## 2008-09-27 ENCOUNTER — Encounter: Payer: Self-pay | Admitting: Orthopedic Surgery

## 2008-10-01 ENCOUNTER — Ambulatory Visit: Payer: Self-pay | Admitting: Orthopedic Surgery

## 2008-10-01 ENCOUNTER — Telehealth: Payer: Self-pay | Admitting: Orthopedic Surgery

## 2008-10-02 ENCOUNTER — Ambulatory Visit: Payer: Self-pay | Admitting: Family Medicine

## 2008-10-02 ENCOUNTER — Telehealth: Payer: Self-pay | Admitting: Orthopedic Surgery

## 2008-10-02 ENCOUNTER — Encounter: Payer: Self-pay | Admitting: Orthopedic Surgery

## 2008-10-02 ENCOUNTER — Emergency Department (HOSPITAL_COMMUNITY): Admission: EM | Admit: 2008-10-02 | Discharge: 2008-10-02 | Payer: Self-pay | Admitting: Emergency Medicine

## 2008-10-02 ENCOUNTER — Inpatient Hospital Stay (HOSPITAL_COMMUNITY): Admission: AD | Admit: 2008-10-02 | Discharge: 2008-10-07 | Payer: Self-pay | Admitting: Orthopedic Surgery

## 2008-10-08 ENCOUNTER — Telehealth: Payer: Self-pay | Admitting: Orthopedic Surgery

## 2008-10-09 ENCOUNTER — Ambulatory Visit: Payer: Self-pay | Admitting: Family Medicine

## 2008-10-10 ENCOUNTER — Encounter: Payer: Self-pay | Admitting: Orthopedic Surgery

## 2008-10-11 ENCOUNTER — Ambulatory Visit: Payer: Self-pay | Admitting: Orthopedic Surgery

## 2008-10-12 ENCOUNTER — Encounter: Payer: Self-pay | Admitting: Orthopedic Surgery

## 2008-10-17 ENCOUNTER — Inpatient Hospital Stay (HOSPITAL_COMMUNITY): Admission: EM | Admit: 2008-10-17 | Discharge: 2008-10-24 | Payer: Self-pay | Admitting: Emergency Medicine

## 2008-10-17 ENCOUNTER — Encounter: Payer: Self-pay | Admitting: Orthopedic Surgery

## 2008-10-17 ENCOUNTER — Telehealth: Payer: Self-pay | Admitting: Orthopedic Surgery

## 2008-10-29 ENCOUNTER — Encounter: Payer: Self-pay | Admitting: Orthopedic Surgery

## 2008-11-02 ENCOUNTER — Ambulatory Visit: Payer: Self-pay | Admitting: Family Medicine

## 2008-11-02 ENCOUNTER — Encounter: Payer: Self-pay | Admitting: Orthopedic Surgery

## 2008-11-07 ENCOUNTER — Encounter (INDEPENDENT_AMBULATORY_CARE_PROVIDER_SITE_OTHER): Payer: Self-pay | Admitting: Family Medicine

## 2008-11-08 ENCOUNTER — Ambulatory Visit: Payer: Self-pay | Admitting: Orthopedic Surgery

## 2008-11-12 ENCOUNTER — Encounter: Payer: Self-pay | Admitting: Orthopedic Surgery

## 2008-11-15 ENCOUNTER — Encounter: Payer: Self-pay | Admitting: Urgent Care

## 2008-11-30 ENCOUNTER — Ambulatory Visit: Payer: Self-pay | Admitting: Family Medicine

## 2008-11-30 DIAGNOSIS — K279 Peptic ulcer, site unspecified, unspecified as acute or chronic, without hemorrhage or perforation: Secondary | ICD-10-CM | POA: Insufficient documentation

## 2008-11-30 DIAGNOSIS — F1010 Alcohol abuse, uncomplicated: Secondary | ICD-10-CM

## 2008-11-30 HISTORY — DX: Peptic ulcer, site unspecified, unspecified as acute or chronic, without hemorrhage or perforation: K27.9

## 2008-12-03 LAB — CONVERTED CEMR LAB
BUN: 20 mg/dL (ref 6–23)
Basophils Absolute: 0 10*3/uL (ref 0.0–0.1)
Basophils Relative: 0 % (ref 0–1)
Calcium: 8.9 mg/dL (ref 8.4–10.5)
Eosinophils Relative: 3 % (ref 0–5)
Glucose, Bld: 95 mg/dL (ref 70–99)
Lymphocytes Relative: 24 % (ref 12–46)
MCHC: 31.9 g/dL (ref 30.0–36.0)
Monocytes Absolute: 0.6 10*3/uL (ref 0.1–1.0)
Neutro Abs: 5.9 10*3/uL (ref 1.7–7.7)
PSA: 2.23 ng/mL (ref 0.10–4.00)
Platelets: 199 10*3/uL (ref 150–400)
Potassium: 4.3 meq/L (ref 3.5–5.3)
RDW: 16 % — ABNORMAL HIGH (ref 11.5–15.5)
Sodium: 140 meq/L (ref 135–145)

## 2009-01-01 ENCOUNTER — Ambulatory Visit: Payer: Self-pay | Admitting: Family Medicine

## 2009-01-01 DIAGNOSIS — L0231 Cutaneous abscess of buttock: Secondary | ICD-10-CM | POA: Insufficient documentation

## 2009-01-01 DIAGNOSIS — L03317 Cellulitis of buttock: Secondary | ICD-10-CM | POA: Insufficient documentation

## 2009-01-07 ENCOUNTER — Telehealth: Payer: Self-pay | Admitting: Orthopedic Surgery

## 2009-01-21 ENCOUNTER — Encounter: Payer: Self-pay | Admitting: Gastroenterology

## 2009-02-19 ENCOUNTER — Ambulatory Visit: Payer: Self-pay | Admitting: Family Medicine

## 2009-02-24 IMAGING — CR DG CHEST 1V PORT
1 series · 1 of 1 positions shown · non-contrast
Comparison: Portable chest x-ray 03/14/2007.

CLINICAL DATA: Hypotension. Renal failure.

PORTABLE CHEST - 1 VIEW  [DATE]/5225 5007 hours:

[view not recorded]
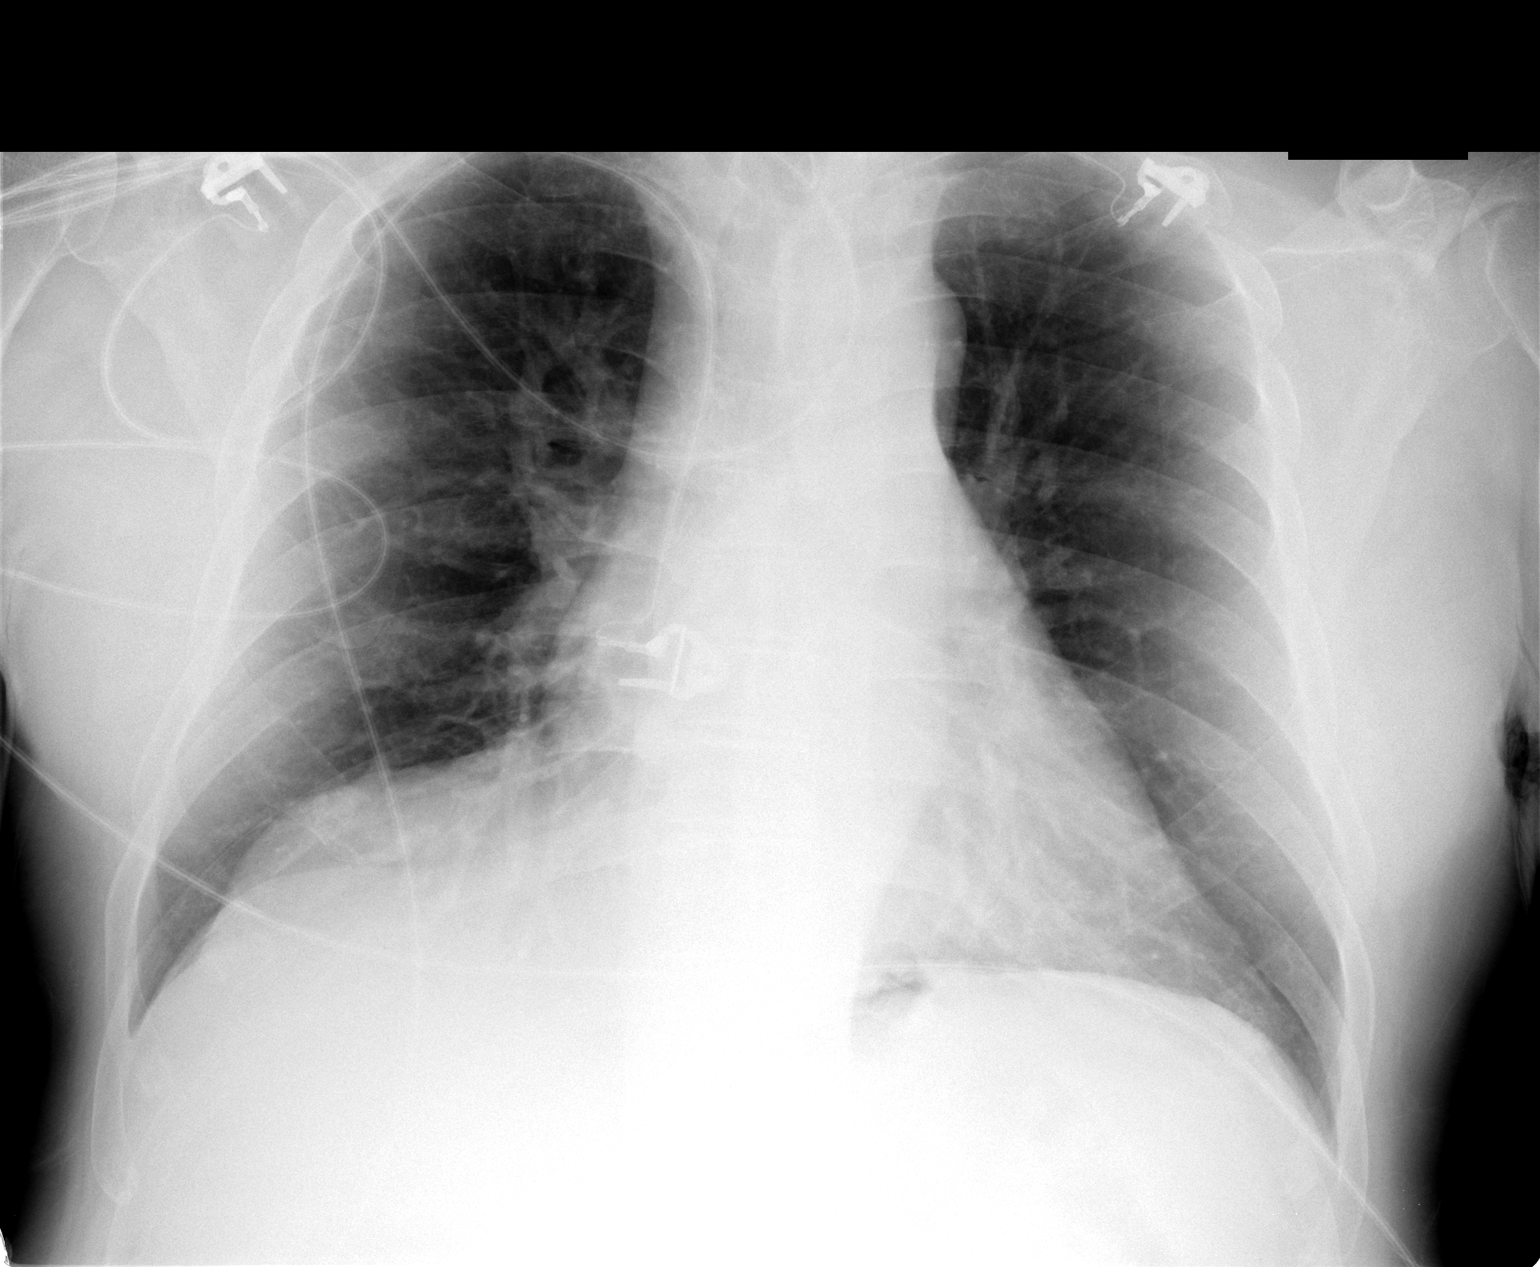

[1 of 1 positions shown; findings below may reference images not displayed]

FINDINGS: Heart mildly enlarged but stable. Pulmonary vascularity normal
without evidence of pulmonary edema. No localized air space consolidation. No
pleural effusions.
IMPRESSION: Stable mild cardiomegaly. No acute cardiopulmonary disease.

## 2009-02-26 ENCOUNTER — Ambulatory Visit: Payer: Self-pay | Admitting: Family Medicine

## 2009-03-08 ENCOUNTER — Ambulatory Visit: Payer: Self-pay | Admitting: Family Medicine

## 2009-03-11 ENCOUNTER — Telehealth (INDEPENDENT_AMBULATORY_CARE_PROVIDER_SITE_OTHER): Payer: Self-pay | Admitting: *Deleted

## 2009-03-22 ENCOUNTER — Ambulatory Visit: Payer: Self-pay | Admitting: Family Medicine

## 2009-03-23 ENCOUNTER — Encounter (INDEPENDENT_AMBULATORY_CARE_PROVIDER_SITE_OTHER): Payer: Self-pay | Admitting: Family Medicine

## 2009-03-25 ENCOUNTER — Encounter (INDEPENDENT_AMBULATORY_CARE_PROVIDER_SITE_OTHER): Payer: Self-pay | Admitting: Family Medicine

## 2009-03-25 ENCOUNTER — Ambulatory Visit: Payer: Self-pay | Admitting: Orthopedic Surgery

## 2009-03-26 LAB — CONVERTED CEMR LAB
Basophils Absolute: 0 10*3/uL (ref 0.0–0.1)
Chloride: 107 meq/L (ref 96–112)
Free T4: 1.01 ng/dL (ref 0.80–1.80)
HCT: 38 % — ABNORMAL LOW (ref 39.0–52.0)
Lymphocytes Relative: 36 % (ref 12–46)
Lymphs Abs: 2.3 10*3/uL (ref 0.7–4.0)
Neutro Abs: 3 10*3/uL (ref 1.7–7.7)
Platelets: 182 10*3/uL (ref 150–400)
Potassium: 3.5 meq/L (ref 3.5–5.3)
RDW: 16.5 % — ABNORMAL HIGH (ref 11.5–15.5)
Sodium: 142 meq/L (ref 135–145)
T3, Free: 2.7 pg/mL (ref 2.3–4.2)
TSH: 6.638 microintl units/mL — ABNORMAL HIGH (ref 0.350–4.500)
Uric Acid, Serum: 6.9 mg/dL (ref 4.0–7.8)
WBC: 6.3 10*3/uL (ref 4.0–10.5)

## 2009-04-19 ENCOUNTER — Ambulatory Visit: Payer: Self-pay | Admitting: Family Medicine

## 2009-04-29 ENCOUNTER — Encounter (INDEPENDENT_AMBULATORY_CARE_PROVIDER_SITE_OTHER): Payer: Self-pay | Admitting: General Surgery

## 2009-04-29 ENCOUNTER — Ambulatory Visit (HOSPITAL_COMMUNITY): Admission: RE | Admit: 2009-04-29 | Discharge: 2009-04-29 | Payer: Self-pay | Admitting: General Surgery

## 2009-05-17 ENCOUNTER — Ambulatory Visit: Payer: Self-pay | Admitting: Family Medicine

## 2009-05-27 ENCOUNTER — Encounter (INDEPENDENT_AMBULATORY_CARE_PROVIDER_SITE_OTHER): Payer: Self-pay | Admitting: Family Medicine

## 2009-06-20 ENCOUNTER — Ambulatory Visit: Payer: Self-pay | Admitting: Orthopedic Surgery

## 2009-06-20 DIAGNOSIS — Z96659 Presence of unspecified artificial knee joint: Secondary | ICD-10-CM

## 2009-12-09 ENCOUNTER — Encounter (INDEPENDENT_AMBULATORY_CARE_PROVIDER_SITE_OTHER): Payer: Self-pay | Admitting: *Deleted

## 2009-12-09 ENCOUNTER — Encounter: Payer: Self-pay | Admitting: Urgent Care

## 2010-01-06 ENCOUNTER — Ambulatory Visit: Payer: Self-pay | Admitting: Gastroenterology

## 2010-01-06 DIAGNOSIS — Z862 Personal history of diseases of the blood and blood-forming organs and certain disorders involving the immune mechanism: Secondary | ICD-10-CM

## 2010-01-06 DIAGNOSIS — R634 Abnormal weight loss: Secondary | ICD-10-CM | POA: Insufficient documentation

## 2010-01-16 ENCOUNTER — Encounter: Payer: Self-pay | Admitting: Gastroenterology

## 2010-01-31 LAB — CONVERTED CEMR LAB
Albumin: 3.8 g/dL (ref 3.5–5.2)
Eosinophils Absolute: 0.2 10*3/uL (ref 0.0–0.7)
Indirect Bilirubin: 0.6 mg/dL (ref 0.0–0.9)
Lymphocytes Relative: 50 % — ABNORMAL HIGH (ref 12–46)
Lymphs Abs: 3.2 10*3/uL (ref 0.7–4.0)
MCV: 93.8 fL (ref 78.0–100.0)
Neutro Abs: 2.3 10*3/uL (ref 1.7–7.7)
Neutrophils Relative %: 36 % — ABNORMAL LOW (ref 43–77)
Platelets: 154 10*3/uL (ref 150–400)
RBC: 4.36 M/uL (ref 4.22–5.81)
Total Bilirubin: 0.8 mg/dL (ref 0.3–1.2)
Total Protein: 7.8 g/dL (ref 6.0–8.3)
WBC: 6.4 10*3/uL (ref 4.0–10.5)

## 2010-02-11 ENCOUNTER — Emergency Department (HOSPITAL_COMMUNITY): Admission: EM | Admit: 2010-02-11 | Discharge: 2010-02-11 | Payer: Self-pay | Admitting: Emergency Medicine

## 2010-02-12 ENCOUNTER — Encounter: Payer: Self-pay | Admitting: Gastroenterology

## 2010-03-08 ENCOUNTER — Emergency Department (HOSPITAL_COMMUNITY): Admission: EM | Admit: 2010-03-08 | Discharge: 2010-03-08 | Payer: Self-pay | Admitting: Emergency Medicine

## 2010-06-19 ENCOUNTER — Encounter (INDEPENDENT_AMBULATORY_CARE_PROVIDER_SITE_OTHER): Payer: Self-pay | Admitting: *Deleted

## 2010-07-23 ENCOUNTER — Ambulatory Visit: Payer: Self-pay | Admitting: Orthopedic Surgery

## 2010-07-28 ENCOUNTER — Ambulatory Visit: Payer: Self-pay | Admitting: Gastroenterology

## 2010-07-29 DIAGNOSIS — K760 Fatty (change of) liver, not elsewhere classified: Secondary | ICD-10-CM

## 2010-07-29 DIAGNOSIS — D509 Iron deficiency anemia, unspecified: Secondary | ICD-10-CM

## 2010-08-19 ENCOUNTER — Encounter (INDEPENDENT_AMBULATORY_CARE_PROVIDER_SITE_OTHER): Payer: Self-pay

## 2010-09-09 LAB — CONVERTED CEMR LAB
Basophils Absolute: 0 10*3/uL (ref 0.0–0.1)
Basophils Relative: 0 % (ref 0–1)
Bilirubin, Direct: 0.1 mg/dL (ref 0.0–0.3)
Indirect Bilirubin: 1 mg/dL — ABNORMAL HIGH (ref 0.0–0.9)
MCHC: 32.8 g/dL (ref 30.0–36.0)
Neutro Abs: 2.5 10*3/uL (ref 1.7–7.7)
Neutrophils Relative %: 42 % — ABNORMAL LOW (ref 43–77)
RDW: 13.7 % (ref 11.5–15.5)
Total Bilirubin: 1.1 mg/dL (ref 0.3–1.2)

## 2010-09-21 ENCOUNTER — Encounter: Payer: Self-pay | Admitting: Internal Medicine

## 2010-09-30 NOTE — Assessment & Plan Note (Signed)
Summary: needs ov for rx refills/gu   Visit Type:  f/u Primary Care Provider:  [facility]In[facility]  Chief Complaint:  follow up and needed before more refills.  History of Present Illness: [name] is a pleasant 60 y/o male, who presents for f/u OV. He was last seen 11/09. He has h/o multiple gastric polyps (not all resected), rectal nodule - not adenomatous (not resected), IDA, B12 deficiency, fatty liver, diverticular bleed in 2008. He has been doing well. He denies melena, brbpr, constipation, diarrhea, abd pain, n/v, dysphagia, heartburn. He appetite is not what it used to be. Weight in 4/09 was 235lbs, 8/09 was 257lbs, 11/09 was 260lbs, 8/10 was 229lbs, today 224lbs.   He is seeing Dr. Hinda Lenis for his renal disease. Last labs with Dr. Everette Rank couple weeks ago. Receiving B12 injections monthly with Dr. Everette Rank.  Current Medications (verified): 1)  Daily Multiple Vitamins  Tabs (Multiple Vitamin) .... Once Daily 2)  Omeprazole 20 Mg  Cpdr (Omeprazole) .... One Daily 3)  Metoprolol Succinate 25 Mg Tb24 (Metoprolol Succinate) .... Two Times A Day 4)  Vicodin 5-500 Mg  Tabs (Hydrocodone-Acetaminophen) .... One Every 4 Hours Per Dr. Aline Brochure 5)  Folic Acid 1 Mg Tabs (Folic Acid) .... Once Daily 6)  Poly-Iron 150 150 Mg Caps (Polysaccharide Iron Complex) .... Take One Capsule By Mouth Twice Daily. 7)  Lotrel 10-20 Mg Caps (Amlodipine Besy-Benazepril Hcl) .... One Daily 8)  Allopurinol 100 Mg Tabs (Allopurinol) .... Two Times A Day 9)  Vitamin D 50000units .... Q Week 10)  Furosemide 20 Mg Tabs (Furosemide) .... Once Daily 11)  Levothyroxine Sodium 25 Mcg Tabs (Levothyroxine Sodium) .... Once Daily  Allergies (verified): 1)  ! Aspirin  Past History:  Past Surgical History: Last updated: 11/30/2008 1. Left tibial nail removal 2. right knee ligament repair 3. right hand fracture with surgery 4. heart cath - 2007 - normal with normal EF 5. Cholecystectomy 6. I & D lft knee 01-01-2007  [facility] 7. 06-29-2006 left total knee arthroplasty 8. EGD - April 2009 - gastric polyps 9. Right knee replacement - Dr. Darcel Bayley 2010.  Past Medical History: Current Problems:  JOINT EFFUSION, KNEE (ICD-719.06) KNEE PAIN (ICD-719.46) GASTRIC POLYP (ICD-211.1), multiple, not all resected, last EGD 4/09. Pt declined repeat EGD at that time.  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 Ardis Hughs, pancolonic diverticulosis, rectal nodule 2-3cm from anus s/p bx only-->no adenomatous changes Fatty liver, U/S 2009, normal LFTs  Family History: Father: Dead 67 - CKD Mother: Dead 39 Kidney disease Siblings: 5 Brothers: HTn and kidney problems - pt poor historian on this No FH or CRC, colon polyps  Social History: Single, never married Smoker, occasional Alcohol use-yes, one drink of gin per week, long h/o chronic alcohol abuse Drug use-no Occupation - [employer] fast [employer] work - disabled form [employer] Finished 4th grade He lives alone  Review of Systems      See HPI  Vital Signs:  Patient profile:   60 year old male Height:      71 inches Weight:      224 pounds BMI:     31.35 Temp:  98.0 degrees F oral Pulse rate:   72 / minute BP sitting:   110 / 70  (left arm) Cuff size:   regular  Vitals Entered By: Burnadette Peter LPN (May  9, 624THL 579FGE AM)  Physical Exam  General:   Well developed, well nourished, no acute distress. Head:  Normocephalic and atraumatic. Eyes:  Conjunctivae pink, no scleral icterus.  Mouth:  Oropharyngeal mucosa moist, pink.  No lesions, erythema or exudate.   poor dentition.   Lungs:  Clear throughout to auscultation. Heart:  Regular rate and rhythm; no murmurs, rubs,  or bruits. Abdomen:  Bowel sounds normal.  Abdomen is soft, nontender, nondistended.  No rebound or guarding.  No hepatosplenomegaly, masses or hernias.  No abdominal bruits.  Extremities:  No clubbing, cyanosis, edema or deformities noted. Neurologic:  Alert and  oriented x4;  grossly normal neurologically. Skin:  Intact without significant lesions or rashes. Psych:  Alert and cooperative. Normal mood and affect.  Impression & Recommendations:  Problem # 1:  IRON DEFICIENCY ANEMIA, HX OF (ICD-V12.3)  Request labs from Dr. Everette Rank. If persistent IDA, consider ifobt. He may need EGD to complete gastric polypectomies.   Orders: Est. Patient Level III [id number])  Problem # 2:  GERD (ICD-530.81)  Stable.  Orders: Est. Patient Level III [id number])  Problem # 3:  Rectal nodule H/O rectal nodule, bx unremarkable. Will discuss with Dr. Oneida Alar regarding timing of next TCS.   Problem # 4:  WEIGHT LOSS, ABNORMAL (ICD-783.21)  Review labs. Concerned given h/o chronic alcohol abuse. May need CT A/P to r/o underlying malignancy. Further recommendations to follow.   Orders: Est. Patient Level III [id number])  Appended Document: Orders Update-labs Received labs from Dr. Everette Rank. Creatinine 2.22, TSH 5.571.    Let's get CBC and LFTs.   Clinical Lists Changes  Orders: Added new Test order of T-CBC w/Diff [phone]) - Signed Added new Test order of T-Hepatic Function [phone]) - Signed      Appended Document: needs ov for rx refills/gu Pt informed. Lab order faxed to [facility].  Appended Document: needs ov for rx refills/gu Benign rectal nodule. Next TCS 2018.

## 2010-09-30 NOTE — Letter (Signed)
Summary: rmsa chart  rmsa chart   Imported By: Eliezer Mccoy 05/30/2010 14:24:30  _____________________________________________________________________  External Attachment:    Type:   Image     Comment:   External Document

## 2010-09-30 NOTE — Medication Information (Signed)
Summary: Visual merchandiser   Imported By: Zeb Comfort 12/09/2009 08:51:04  _____________________________________________________________________  External Attachment:    Type:   Image     Comment:   External Document  Appended Document: RX Folder Pt needs OV prior to RFs.

## 2010-09-30 NOTE — Letter (Signed)
Summary: Scheduled Appointment  [facility]  [address]   [place], [place] [address]   Phone: [phone]  Fax: [phone]    December 09, 2009   Dear: [name]            DOB: 11-May-1951    I have been instructed to schedule you an appointment in our office.  Your appointment is as follows:   Date: 01/06/10   Time: 830am    Please be here 15 minutes early.   Provider: Neil Crouch, P.A.-C    Please contact the office if you need to reschedule this appointment for a more convenient time.   Thank you,    [facility] Ph: [phone]   Fax: [phone]

## 2010-09-30 NOTE — Medication Information (Signed)
Summary: Visual merchandiser   Imported By: Zeb Comfort 02/12/2010 11:25:01  _____________________________________________________________________  External Attachment:    Type:   Image     Comment:   External Document  Appended Document: RX Folder-omeprazole    Prescriptions: OMEPRAZOLE 20 MG  CPDR (OMEPRAZOLE) One daily  #31 x 11   Entered and Authorized by:   Laureen Ochs. Bernarda Caffey   Signed by:   Laureen Ochs Bernarda Caffey on 02/12/2010   Method used:   Electronically to        [facility] (retail)       [place] [address]       [place], [place]  [address]       Ph: [phone]       Fax: [phone]   RxID:   [id number]

## 2010-09-30 NOTE — Letter (Signed)
Summary: Recall Office Visit  [facility]  [address]   [place], [place] [address]   Phone: [phone]  Fax: [phone]      June 19, 2010   ADARRYLL LARRIMORE [address] [place], [place]  [address] 09-07-1950   Dear Mr. BEATO,   According to our records, it is time for you to schedule a follow-up office visit with [place].   At your convenience, please call [phone] to schedule an office visit. If you have any questions, concerns, or feel that this letter is in error, we would appreciate your call.   Sincerely,    [facility] Ph: [phone]   Fax: [phone]

## 2010-09-30 NOTE — Assessment & Plan Note (Signed)
Summary: RE-CK/XRAYS/RT TKA FOL/UP/EVERCARE/MEDICAID/CAF   Visit Type:  Follow-up Primary Provider:  Everette Rank  CC:  bilateral knee replacements.  History of Present Illness: status post bilateral total knee replacements. Mr. Burkley presents for repeat films and will survey  10/302007 left knee TKA.  08/2008  RT TKA   Medications: Vicodin  Xrays today of both knees.Mr. Pruyn has no real complaints.  He says "he did a good job on both of my knees."  Bilateral knee x-rays 3 views each of the LEFT knee. There are 2 staples from her previous surgery with a Pellegrini-Steida  lesion. Overall alignment is good.  RIGHT knee. No difficulty seen. Alignment is normal.  Impression normal alignment. Postop bilateral total knee replacements   Allergies: 1)  ! Aspirin   Impression & Recommendations:  Problem # 1:  TOTAL KNEE FOLLOW-UP (ICD-V43.65) Assessment Comment Only  Doing well   Orders: Est. Patient Level II [id number]) Knees  x-ray bilateral [id number])  Patient Instructions: 1)  1 year bilateral knee films post TKA   Orders Added: 1)  Est. Patient Level II [id number] 2)  Knees  x-ray bilateral [[id number]]  Appended Document: RE-CK/XRAYS/RT TKA FOL/UP/EVERCARE/MEDICAID/CAF x-ray addendum there is varus tilt to the tibial tray, which is noted since surgery. There is been no loosening or further depression of the tibial plateau.  Appended Document: FU OV IN 6 MONTHS,FATTY LIVER,WT LOSS/SS 6 MONTH F/U OPV IS IN THE COMPUTER

## 2010-10-02 NOTE — Letter (Signed)
Summary: [facility]  [address]   [place], [place] [address]   Phone: [phone]  Fax: [phone]     August 19, 2010   CRASH LONTZ [address] [place], [place]  [address] 07/04/1951  Dear Mr. WENDLER,  During your last appointment, your doctor requested you have some blood work.  Our records indicate you have not had this done.  Remember it is very important to follow your doctor's instructions.   Please have this blood work done as soon as possible.  It is important that patients and their doctor work together in the management/treatment of their health care.  If you have lost or misplaced your lab orders, please give [place] a call and we will gladly give you a new copy.  If you have already had your blood work drawn, please disregard this letter.  Thank you,    Burnadette Peter LPN  [facility] Ph: [phone]   Fax: [phone]

## 2010-10-02 NOTE — Assessment & Plan Note (Signed)
Summary: FU OV IN 6 MONTHS,FATTY LIVER,WT LOSS/SS   Visit Type:  Follow-up Visit Primary Care Provider:  [facility]Innis  CC:  F/U fatty liver/IDA/ wt loss.  History of Present Illness: Mr. Mountford is a 60 year old male with a past hx of multiple inflamed hyperplastic gastric polyps found on EGD April 2009, also hx of IDA and B12 deficiency, fatty liver, last LFTs drawn in May. Presents today for routine follow. He denies abdominal pain, N/V. Reflux is controlled  on nexium. denies constipation, no melena or hematochezia. He really has no complaints currently. Last EGD was in April 2009. Next TCS due in 2018.  Current Medications (verified): 1)  Daily Multiple Vitamins  Tabs (Multiple Vitamin) .... Once Daily 2)  Omeprazole 20 Mg  Cpdr (Omeprazole) .... One Daily 3)  Metoprolol Succinate 25 Mg Tb24 (Metoprolol Succinate) .... Two Times A Day 4)  Vicodin 5-500 Mg  Tabs (Hydrocodone-Acetaminophen) .... One Every 4 Hours Per Dr. Aline Brochure As Needed 5)  Folic Acid 1 Mg Tabs (Folic Acid) .... Once Daily 6)  Allopurinol 100 Mg Tabs (Allopurinol) .... Two Times A Day 7)  Furosemide 20 Mg Tabs (Furosemide) .... Once Daily 8)  Levothyroxine Sodium 75 Mcg Tabs (Levothyroxine Sodium) .... Once Daily 9)  Uloric 40 Mg Tabs (Febuxostat) .... Take 1 Tablet By Mouth Once A Day  Allergies (verified): 1)  ! Aspirin  Past History:  Past Medical History: Last updated: 01/06/2010 Current Problems:  JOINT EFFUSION, KNEE (ICD-719.06) KNEE PAIN (ICD-719.46) GASTRIC POLYP (ICD-211.1), multiple, not all resected, last EGD 4/09. Pt declined repeat EGD at that time.  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 Ardis Hughs, pancolonic diverticulosis, rectal nodule 2-3cm from anus s/p bx only-->no adenomatous changes Fatty liver, U/S 2009, normal LFTs  Past Surgical History: Last updated: 11/30/2008 1. Left tibial nail removal 2. right knee ligament repair 3. right hand fracture with surgery 4. heart cath - 2007 - normal with normal EF 5. Cholecystectomy 6. I & D lft knee 01-01-2007 [facility] 7. 06-29-2006 left total knee arthroplasty 8. EGD - April 2009 - gastric polyps 9. Right knee replacement - Dr. Darcel Bayley 2010.  Review of Systems General:  Denies fever, chills, and anorexia. Eyes:  Denies blurring, irritation, and discharge. ENT:  Denies sore throat, hoarseness, and difficulty swallowing. CV:  Denies chest pains and syncope. Resp:  Denies dyspnea at rest, cough, and wheezing. GI:  Denies difficulty swallowing, pain on swallowing, nausea, indigestion/heartburn, change in bowel habits, bloody BM's, and black BMs. GU:  Denies urinary burning and urinary frequency. MS:  Denies joint pain / LOM, joint swelling, and joint stiffness. Derm:  Denies rash, itching, and dry skin. Neuro:  Denies weakness and paralysis. Psych:  Denies depression and anxiety. Endo:  Denies cold intolerance and heat intolerance. Heme:  Denies bruising and bleeding.  Vital Signs:  Patient profile:   60 year old male Height:      71 inches Weight:      240 pounds BMI:     33.59 Temp:     99.0 degrees F oral Pulse rate:   64 / minute BP sitting:   140 /  96  (left arm) Cuff size:   large  Vitals Entered By: Waldon Merl LPN (November 28, 624THL 2:40 PM)  Physical Exam  General:  Well developed, well  nourished, no acute distress. Head:  Normocephalic and atraumatic. Eyes:  sclera without icterus Lungs:  Clear throughout to auscultation. Heart:  Regular rate and rhythm; no murmurs, rubs,  or bruits. Abdomen:  normal bowel sounds, without guarding, without rebound, no distesion, no tenderness, no masses, and no hepatomegally or splenomegaly.   Msk:  Symmetrical with no gross deformities. Normal posture. Pulses:  Normal pulses noted. Extremities:  No clubbing, cyanosis, edema or deformities noted. Neurologic:  Alert and  oriented x4;  grossly normal neurologically. Psych:  Alert and cooperative. Normal mood and affect.  Impression & Recommendations:  Problem # 1:  FATTY LIVER DISEASE (ICD-54.55) 60 year old male with remote hx of elevated LFTs, most recent in May, will recheck today. known fatty liver. counseled on appropriate diet, food choices. as of note, weight 240 today, up from 224 in May.  Recheck LFTs today Lowfat diet handout Return in 6 mos  Orders: Est. Patient Level III [id number])  Problem # 2:  ANEMIA, IRON DEFICIENCY (ICD-280.9) Hx of IDA, multiple gastric polyps found on EGD. No evidence of brbpr or melena. Will recheck CBC today for any changes.  Orders: Est. Patient Level III [id number])  Problem # 3:  GERD (ICD-530.81)  Well controlled on daily nexium. Continue current plan of care.  Orders: Est. Patient Level III [id number])  Problem # 4:  GASTRIC POLYP (ICD-211.1)  see #2.   Orders: Est. Patient Level III [id number])  Other Orders: T-CBC w/Diff [id number]) T-Hepatic Function [id number])  Appended Document: FU OV IN 6 MONTHS,FATTY LIVER,WT LOSS/SS 6 MONTH F/U OPV IS IN THE COMPUTER  Appended Document: FU OV IN 6 MONTHS,FATTY LIVER,WT LOSS/SS has pt gotten LFTs, CBC drawn as requested at appt?  Appended Document: FU OV IN 6 MONTHS,FATTY LIVER,WT LOSS/SS Tried to call pt- NA mailed letter

## 2010-11-16 LAB — DIFFERENTIAL
Lymphocytes Relative: 38 % (ref 12–46)
Lymphs Abs: 2.4 10*3/uL (ref 0.7–4.0)
Monocytes Relative: 9 % (ref 3–12)
Neutro Abs: 3.2 10*3/uL (ref 1.7–7.7)
Neutrophils Relative %: 51 % (ref 43–77)

## 2010-11-16 LAB — BASIC METABOLIC PANEL
CO2: 23 mEq/L (ref 19–32)
Chloride: 99 mEq/L (ref 96–112)
GFR calc non Af Amer: 51 mL/min — ABNORMAL LOW (ref >60)
Glucose, Bld: 77 mg/dL (ref 70–99)
Potassium: 3.4 mEq/L — ABNORMAL LOW (ref 3.5–5.1)
Sodium: 140 mEq/L (ref 135–145)

## 2010-11-16 LAB — LIPASE, BLOOD: Lipase: 32 U/L (ref 11–59)

## 2010-11-16 LAB — CBC
HCT: 41.8 % (ref 39.0–52.0)
Hemoglobin: 14 g/dL (ref 13.0–17.0)
RBC: 4.44 MIL/uL (ref 4.22–5.81)

## 2010-12-06 LAB — POCT I-STAT 4, (NA,K, GLUC, HGB,HCT)
HCT: 40 % (ref 39.0–52.0)
Hemoglobin: 13.6 g/dL (ref 13.0–17.0)
Sodium: 135 mEq/L (ref 135–145)

## 2010-12-06 LAB — CBC
HCT: 37.8 % — ABNORMAL LOW (ref 39.0–52.0)
Hemoglobin: 12.7 g/dL — ABNORMAL LOW (ref 13.0–17.0)
MCV: 93.6 fL (ref 78.0–100.0)
RBC: 4.03 MIL/uL — ABNORMAL LOW (ref 4.22–5.81)
WBC: 6.2 10*3/uL (ref 4.0–10.5)

## 2010-12-06 LAB — BASIC METABOLIC PANEL
Chloride: 104 mEq/L (ref 96–112)
Creatinine, Ser: 1.3 mg/dL (ref 0.4–1.5)
GFR calc Af Amer: >60 mL/min (ref >60)
Potassium: 3.3 mEq/L — ABNORMAL LOW (ref 3.5–5.1)
Sodium: 138 mEq/L (ref 135–145)

## 2010-12-09 ENCOUNTER — Other Ambulatory Visit: Payer: Self-pay | Admitting: *Deleted

## 2010-12-09 DIAGNOSIS — M25569 Pain in unspecified knee: Secondary | ICD-10-CM

## 2010-12-09 MED ORDER — HYDROCODONE-ACETAMINOPHEN 5-500 MG PO TABS: 1.0000 | ORAL_TABLET | ORAL | Status: DC | PRN

## 2010-12-15 LAB — PROTIME-INR
INR: 1.1 (ref 0.00–1.49)
INR: 1.6 — ABNORMAL HIGH (ref 0.00–1.49)
INR: 2 — ABNORMAL HIGH (ref 0.00–1.49)
Prothrombin Time: 13.5 seconds (ref 11.6–15.2)
Prothrombin Time: 19.8 seconds — ABNORMAL HIGH (ref 11.6–15.2)
Prothrombin Time: 23.9 seconds — ABNORMAL HIGH (ref 11.6–15.2)

## 2010-12-15 LAB — CBC
HCT: 40.4 % (ref 39.0–52.0)
HCT: 27.3 % — ABNORMAL LOW (ref 39.0–52.0)
MCHC: 32.4 g/dL (ref 30.0–36.0)
Platelets: 128 10*3/uL — ABNORMAL LOW (ref 150–400)
Platelets: 110 10*3/uL — ABNORMAL LOW (ref 150–400)
Platelets: 114 10*3/uL — ABNORMAL LOW (ref 150–400)
Platelets: 130 10*3/uL — ABNORMAL LOW (ref 150–400)
RBC: 3.16 MIL/uL — ABNORMAL LOW (ref 4.22–5.81)
RDW: 15.9 % — ABNORMAL HIGH (ref 11.5–15.5)
RDW: 16.1 % — ABNORMAL HIGH (ref 11.5–15.5)
WBC: 5 10*3/uL (ref 4.0–10.5)
WBC: 10.7 10*3/uL — ABNORMAL HIGH (ref 4.0–10.5)
WBC: 11.1 10*3/uL — ABNORMAL HIGH (ref 4.0–10.5)

## 2010-12-15 LAB — APTT: aPTT: 26 seconds (ref 24–37)

## 2010-12-15 LAB — BASIC METABOLIC PANEL
BUN: 16 mg/dL (ref 6–23)
BUN: 18 mg/dL (ref 6–23)
BUN: 19 mg/dL (ref 6–23)
BUN: 22 mg/dL (ref 6–23)
CO2: 27 mEq/L (ref 19–32)
Calcium: 8 mg/dL — ABNORMAL LOW (ref 8.4–10.5)
Calcium: 8.2 mg/dL — ABNORMAL LOW (ref 8.4–10.5)
Calcium: 8.2 mg/dL — ABNORMAL LOW (ref 8.4–10.5)
Creatinine, Ser: 1.99 mg/dL — ABNORMAL HIGH (ref 0.4–1.5)
GFR calc Af Amer: 42 mL/min — ABNORMAL LOW (ref >60)
GFR calc non Af Amer: 45 mL/min — ABNORMAL LOW (ref >60)
GFR calc non Af Amer: 39 mL/min — ABNORMAL LOW (ref >60)
GFR calc non Af Amer: 40 mL/min — ABNORMAL LOW (ref >60)
Glucose, Bld: 104 mg/dL — ABNORMAL HIGH (ref 70–99)
Potassium: 3.8 mEq/L (ref 3.5–5.1)
Potassium: 4.4 mEq/L (ref 3.5–5.1)

## 2010-12-15 LAB — CROSSMATCH: ABO/RH(D): A POS

## 2010-12-16 LAB — DIFFERENTIAL
Basophils Absolute: 0.1 10*3/uL (ref 0.0–0.1)
Basophils Absolute: 0 10*3/uL (ref 0.0–0.1)
Basophils Absolute: 0 10*3/uL (ref 0.0–0.1)
Basophils Absolute: 0 10*3/uL (ref 0.0–0.1)
Basophils Relative: 0 % (ref 0–1)
Basophils Relative: 1 % (ref 0–1)
Basophils Relative: 0 % (ref 0–1)
Basophils Relative: 0 % (ref 0–1)
Eosinophils Absolute: 0.2 10*3/uL (ref 0.0–0.7)
Eosinophils Absolute: 0.3 10*3/uL (ref 0.0–0.7)
Eosinophils Absolute: 0.3 10*3/uL (ref 0.0–0.7)
Eosinophils Absolute: 0.4 10*3/uL (ref 0.0–0.7)
Eosinophils Absolute: 0.5 10*3/uL (ref 0.0–0.7)
Eosinophils Absolute: 0.5 10*3/uL (ref 0.0–0.7)
Eosinophils Relative: 3 % (ref 0–5)
Eosinophils Relative: 5 % (ref 0–5)
Eosinophils Relative: 4 % (ref 0–5)
Eosinophils Relative: 5 % (ref 0–5)
Eosinophils Relative: 7 % — ABNORMAL HIGH (ref 0–5)
Lymphocytes Relative: 15 % (ref 12–46)
Lymphocytes Relative: 24 % (ref 12–46)
Lymphocytes Relative: 17 % (ref 12–46)
Lymphocytes Relative: 19 % (ref 12–46)
Lymphocytes Relative: 24 % (ref 12–46)
Lymphocytes Relative: 27 % (ref 12–46)
Lymphs Abs: 1.5 10*3/uL (ref 0.7–4.0)
Lymphs Abs: 1.1 10*3/uL (ref 0.7–4.0)
Lymphs Abs: 1.4 10*3/uL (ref 0.7–4.0)
Lymphs Abs: 1.4 10*3/uL (ref 0.7–4.0)
Lymphs Abs: 1.6 10*3/uL (ref 0.7–4.0)
Lymphs Abs: 1.7 10*3/uL (ref 0.7–4.0)
Monocytes Absolute: 0.3 10*3/uL (ref 0.1–1.0)
Monocytes Absolute: 0.7 10*3/uL (ref 0.1–1.0)
Monocytes Absolute: 0.9 10*3/uL (ref 0.1–1.0)
Monocytes Absolute: 0.5 10*3/uL (ref 0.1–1.0)
Monocytes Absolute: 0.7 10*3/uL (ref 0.1–1.0)
Monocytes Absolute: 0.7 10*3/uL (ref 0.1–1.0)
Monocytes Absolute: 0.8 10*3/uL (ref 0.1–1.0)
Monocytes Absolute: 0.8 10*3/uL (ref 0.1–1.0)
Monocytes Absolute: 0.8 10*3/uL (ref 0.1–1.0)
Monocytes Relative: 7 % (ref 3–12)
Monocytes Relative: 11 % (ref 3–12)
Monocytes Relative: 12 % (ref 3–12)
Monocytes Relative: 13 % — ABNORMAL HIGH (ref 3–12)
Monocytes Relative: 13 % — ABNORMAL HIGH (ref 3–12)
Monocytes Relative: 13 % — ABNORMAL HIGH (ref 3–12)
Monocytes Relative: 9 % (ref 3–12)
Neutro Abs: 9.9 10*3/uL — ABNORMAL HIGH (ref 1.7–7.7)
Neutro Abs: 2.9 10*3/uL (ref 1.7–7.7)
Neutro Abs: 3.1 10*3/uL (ref 1.7–7.7)
Neutro Abs: 4.1 10*3/uL (ref 1.7–7.7)
Neutro Abs: 4.1 10*3/uL (ref 1.7–7.7)
Neutrophils Relative %: 76 % (ref 43–77)
Neutrophils Relative %: 49 % (ref 43–77)
Neutrophils Relative %: 50 % (ref 43–77)
Neutrophils Relative %: 53 % (ref 43–77)
Neutrophils Relative %: 63 % (ref 43–77)

## 2010-12-16 LAB — CBC
HCT: 25.3 % — ABNORMAL LOW (ref 39.0–52.0)
HCT: 27.1 % — ABNORMAL LOW (ref 39.0–52.0)
HCT: 25.8 % — ABNORMAL LOW (ref 39.0–52.0)
HCT: 26.1 % — ABNORMAL LOW (ref 39.0–52.0)
HCT: 26.5 % — ABNORMAL LOW (ref 39.0–52.0)
HCT: 26.8 % — ABNORMAL LOW (ref 39.0–52.0)
HCT: 26.8 % — ABNORMAL LOW (ref 39.0–52.0)
Hemoglobin: 8.4 g/dL — ABNORMAL LOW (ref 13.0–17.0)
Hemoglobin: 9.6 g/dL — ABNORMAL LOW (ref 13.0–17.0)
Hemoglobin: 8.7 g/dL — ABNORMAL LOW (ref 13.0–17.0)
Hemoglobin: 8.8 g/dL — ABNORMAL LOW (ref 13.0–17.0)
Hemoglobin: 8.9 g/dL — ABNORMAL LOW (ref 13.0–17.0)
Hemoglobin: 8.9 g/dL — ABNORMAL LOW (ref 13.0–17.0)
Hemoglobin: 9 g/dL — ABNORMAL LOW (ref 13.0–17.0)
Hemoglobin: 9.2 g/dL — ABNORMAL LOW (ref 13.0–17.0)
Hemoglobin: 9.8 g/dL — ABNORMAL LOW (ref 13.0–17.0)
MCHC: 33 g/dL (ref 30.0–36.0)
MCHC: 33.4 g/dL (ref 30.0–36.0)
MCHC: 34 g/dL (ref 30.0–36.0)
MCHC: 34.3 g/dL (ref 30.0–36.0)
MCV: 92.8 fL (ref 78.0–100.0)
MCV: 93.1 fL (ref 78.0–100.0)
MCV: 92.5 fL (ref 78.0–100.0)
MCV: 92.6 fL (ref 78.0–100.0)
MCV: 92.8 fL (ref 78.0–100.0)
MCV: 93.2 fL (ref 78.0–100.0)
Platelets: 270 10*3/uL (ref 150–400)
Platelets: 151 10*3/uL (ref 150–400)
Platelets: 153 10*3/uL (ref 150–400)
Platelets: 166 10*3/uL (ref 150–400)
RBC: 3.15 MIL/uL — ABNORMAL LOW (ref 4.22–5.81)
RBC: 2.79 MIL/uL — ABNORMAL LOW (ref 4.22–5.81)
RBC: 2.81 MIL/uL — ABNORMAL LOW (ref 4.22–5.81)
RBC: 2.85 MIL/uL — ABNORMAL LOW (ref 4.22–5.81)
RBC: 2.86 MIL/uL — ABNORMAL LOW (ref 4.22–5.81)
RBC: 3.15 MIL/uL — ABNORMAL LOW (ref 4.22–5.81)
RDW: 16.4 % — ABNORMAL HIGH (ref 11.5–15.5)
RDW: 16.8 % — ABNORMAL HIGH (ref 11.5–15.5)
RDW: 15.7 % — ABNORMAL HIGH (ref 11.5–15.5)
RDW: 15.8 % — ABNORMAL HIGH (ref 11.5–15.5)
RDW: 15.9 % — ABNORMAL HIGH (ref 11.5–15.5)
RDW: 15.9 % — ABNORMAL HIGH (ref 11.5–15.5)
RDW: 16.3 % — ABNORMAL HIGH (ref 11.5–15.5)
WBC: 13 10*3/uL — ABNORMAL HIGH (ref 4.0–10.5)
WBC: 6.4 10*3/uL (ref 4.0–10.5)
WBC: 5.6 10*3/uL (ref 4.0–10.5)
WBC: 5.8 10*3/uL (ref 4.0–10.5)
WBC: 5.8 10*3/uL (ref 4.0–10.5)
WBC: 5.9 10*3/uL (ref 4.0–10.5)
WBC: 6 10*3/uL (ref 4.0–10.5)

## 2010-12-16 LAB — BASIC METABOLIC PANEL
BUN: 7 mg/dL (ref 6–23)
BUN: 27 mg/dL — ABNORMAL HIGH (ref 6–23)
BUN: 31 mg/dL — ABNORMAL HIGH (ref 6–23)
BUN: 32 mg/dL — ABNORMAL HIGH (ref 6–23)
CO2: 26 mEq/L (ref 19–32)
CO2: 28 mEq/L (ref 19–32)
CO2: 24 mEq/L (ref 19–32)
CO2: 25 mEq/L (ref 19–32)
Calcium: 8.6 mg/dL (ref 8.4–10.5)
Calcium: 9 mg/dL (ref 8.4–10.5)
Calcium: 8 mg/dL — ABNORMAL LOW (ref 8.4–10.5)
Calcium: 8.3 mg/dL — ABNORMAL LOW (ref 8.4–10.5)
Calcium: 9.1 mg/dL (ref 8.4–10.5)
Chloride: 100 mEq/L (ref 96–112)
Chloride: 102 mEq/L (ref 96–112)
Chloride: 106 mEq/L (ref 96–112)
Chloride: 108 mEq/L (ref 96–112)
Chloride: 110 mEq/L (ref 96–112)
Creatinine, Ser: 1.23 mg/dL (ref 0.4–1.5)
Creatinine, Ser: 2.05 mg/dL — ABNORMAL HIGH (ref 0.4–1.5)
Creatinine, Ser: 3.72 mg/dL — ABNORMAL HIGH (ref 0.4–1.5)
Creatinine, Ser: 5.49 mg/dL — ABNORMAL HIGH (ref 0.4–1.5)
GFR calc Af Amer: 40 mL/min — ABNORMAL LOW (ref >60)
GFR calc Af Amer: 11 mL/min — ABNORMAL LOW (ref >60)
GFR calc Af Amer: 13 mL/min — ABNORMAL LOW (ref >60)
GFR calc Af Amer: 20 mL/min — ABNORMAL LOW (ref >60)
GFR calc Af Amer: 9 mL/min — ABNORMAL LOW (ref >60)
GFR calc non Af Amer: 33 mL/min — ABNORMAL LOW (ref >60)
GFR calc non Af Amer: 56 mL/min — ABNORMAL LOW (ref >60)
GFR calc non Af Amer: 14 mL/min — ABNORMAL LOW (ref >60)
GFR calc non Af Amer: 8 mL/min — ABNORMAL LOW (ref >60)
GFR calc non Af Amer: 8 mL/min — ABNORMAL LOW (ref >60)
GFR calc non Af Amer: 9 mL/min — ABNORMAL LOW (ref >60)
Glucose, Bld: 93 mg/dL (ref 70–99)
Glucose, Bld: 96 mg/dL (ref 70–99)
Glucose, Bld: 105 mg/dL — ABNORMAL HIGH (ref 70–99)
Glucose, Bld: 86 mg/dL (ref 70–99)
Glucose, Bld: 93 mg/dL (ref 70–99)
Glucose, Bld: 95 mg/dL (ref 70–99)
Potassium: 3.8 mEq/L (ref 3.5–5.1)
Potassium: 4.1 mEq/L (ref 3.5–5.1)
Potassium: 4.1 mEq/L (ref 3.5–5.1)
Potassium: 4.3 mEq/L (ref 3.5–5.1)
Potassium: 4.3 mEq/L (ref 3.5–5.1)
Sodium: 134 mEq/L — ABNORMAL LOW (ref 135–145)
Sodium: 137 mEq/L (ref 135–145)
Sodium: 138 mEq/L (ref 135–145)
Sodium: 135 mEq/L (ref 135–145)
Sodium: 138 mEq/L (ref 135–145)
Sodium: 139 mEq/L (ref 135–145)
Sodium: 139 mEq/L (ref 135–145)
Sodium: 140 mEq/L (ref 135–145)

## 2010-12-16 LAB — SEDIMENTATION RATE: Sed Rate: 100 mm/hr — ABNORMAL HIGH (ref 0–16)

## 2010-12-16 LAB — URINALYSIS, ROUTINE W REFLEX MICROSCOPIC
Bilirubin Urine: NEGATIVE
Bilirubin Urine: NEGATIVE
Bilirubin Urine: NEGATIVE
Glucose, UA: NEGATIVE mg/dL
Glucose, UA: NEGATIVE mg/dL
Hgb urine dipstick: NEGATIVE
Ketones, ur: NEGATIVE mg/dL
Ketones, ur: NEGATIVE mg/dL
Protein, ur: NEGATIVE mg/dL
Specific Gravity, Urine: 1.01 (ref 1.005–1.030)
Specific Gravity, Urine: 1.025 (ref 1.005–1.030)
Urobilinogen, UA: 0.2 mg/dL (ref 0.0–1.0)
pH: 5.5 (ref 5.0–8.0)
pH: 6 (ref 5.0–8.0)

## 2010-12-16 LAB — COMPREHENSIVE METABOLIC PANEL
Alkaline Phosphatase: 52 U/L (ref 39–117)
BUN: 33 mg/dL — ABNORMAL HIGH (ref 6–23)
CO2: 23 mEq/L (ref 19–32)
GFR calc non Af Amer: 8 mL/min — ABNORMAL LOW (ref >60)
Glucose, Bld: 117 mg/dL — ABNORMAL HIGH (ref 70–99)
Potassium: 4.2 mEq/L (ref 3.5–5.1)
Total Protein: 6.6 g/dL (ref 6.0–8.3)

## 2010-12-16 LAB — MAGNESIUM: Magnesium: 2 mg/dL (ref 1.5–2.5)

## 2010-12-16 LAB — URINE CULTURE

## 2010-12-16 LAB — FERRITIN: Ferritin: 147 ng/mL (ref 22–322)

## 2010-12-16 LAB — URINE MICROSCOPIC-ADD ON

## 2010-12-16 LAB — HIGH SENSITIVITY CRP: CRP, High Sensitivity: 30.2 mg/L — ABNORMAL HIGH

## 2010-12-16 LAB — IRON AND TIBC: UIBC: 137 ug/dL

## 2010-12-16 LAB — PHOSPHORUS: Phosphorus: 4.4 mg/dL (ref 2.3–4.6)

## 2010-12-16 LAB — VANCOMYCIN, RANDOM: Vancomycin Rm: >60 ug/mL

## 2010-12-16 LAB — PROTIME-INR: Prothrombin Time: 15.5 seconds — ABNORMAL HIGH (ref 11.6–15.2)

## 2010-12-16 LAB — CK TOTAL AND CKMB (NOT AT ARMC)
Relative Index: INVALID (ref 0.0–2.5)
Total CK: 40 U/L (ref 7–232)

## 2010-12-30 ENCOUNTER — Encounter: Payer: Self-pay | Admitting: Gastroenterology

## 2011-01-07 ENCOUNTER — Encounter: Payer: Self-pay | Admitting: Gastroenterology

## 2011-01-07 ENCOUNTER — Ambulatory Visit (INDEPENDENT_AMBULATORY_CARE_PROVIDER_SITE_OTHER): Payer: Self-pay | Admitting: Gastroenterology

## 2011-01-07 VITALS — BP 164/106 | HR 111 | Temp 98.1°F | Ht 72.0 in | Wt 243.6 lb

## 2011-01-07 DIAGNOSIS — D509 Iron deficiency anemia, unspecified: Secondary | ICD-10-CM

## 2011-01-07 DIAGNOSIS — D649 Anemia, unspecified: Secondary | ICD-10-CM

## 2011-01-07 DIAGNOSIS — I10 Essential (primary) hypertension: Secondary | ICD-10-CM

## 2011-01-07 DIAGNOSIS — K7689 Other specified diseases of liver: Secondary | ICD-10-CM

## 2011-01-07 DIAGNOSIS — Z1211 Encounter for screening for malignant neoplasm of colon: Secondary | ICD-10-CM

## 2011-01-07 MED ORDER — AMLODIPINE BESYLATE 10 MG PO TABS: 10.0000 mg | ORAL_TABLET | Freq: Every day | ORAL | Status: DC

## 2011-01-07 NOTE — Assessment & Plan Note (Signed)
Pt lost 17 lbs.   Lose 10 more pounds within the next year. Follow up in 1 year.

## 2011-01-07 NOTE — Progress Notes (Signed)
Reminder in epic to follow up in one year

## 2011-01-07 NOTE — Progress Notes (Signed)
Cc to PCP 

## 2011-01-07 NOTE — Progress Notes (Signed)
  Subjective:    Patient ID: [name], male    DOB: Jan 23, 1951, 60 y.o.   MRN: [id number]  PCP: McInnis  HPI Pt seen in F/U for anemia. No melena, BRBPR, or hematemesis. LAst Hb NOV 2011 13.9. On iron once daily. BP uncontrolled said dr. Everette Rank stopped his BP meds last year.   Past Medical History  Diagnosis Date  . Diverticulosis 2008 LGIB  . Gout   . HTN (hypertension)   . History of septic arthritis   . History of alcohol abuse   . Anemia FeDA: GASTRIC POLYPS, B12    TCS 2008 EGD 2009, 2008-HB 11.1 MCV 83.6 CR 1.22, 2009 FERRITIN 102-22  . Hepatomegaly 2o to FATTY LIVER DZ   Past Surgical History  Procedure Date  . Upper gastrointestinal endoscopy APR 2009    INFLAMED HYPERPLASTIC POLYPS, CHRONIC GASTRITIS  . Colonoscopy 2008 [facility] DJ    LGIB 2o to TICS, prep good   Allergies  Allergen Reactions  . Aspirin     REACTION: Diverticular Bleed   Current Outpatient Prescriptions  Medication Sig Dispense Refill  . febuxostat (ULORIC) 40 MG tablet Take 80 mg by mouth daily.        . folic acid (FOLVITE) 1 MG tablet Take 1 mg by mouth daily.        Marland Kitchen HYDROcodone-acetaminophen (VICODIN) 5-500 MG per tablet Take 1 tablet by mouth every 4 (four) hours as needed for pain.  42 tablet  3  . iron polysaccharides (NIFEREX) 150 MG capsule Take 150 mg by mouth 2 (two) times daily.        Marland Kitchen levothyroxine (SYNTHROID, LEVOTHROID) 75 MCG tablet Take 75 mcg by mouth daily.        . metoprolol succinate (TOPROL-XL) 25 MG 24 hr tablet Take 25 mg by mouth daily.        Marland Kitchen omeprazole (PRILOSEC) 20 MG capsule Take 20 mg by mouth daily.        Marland Kitchen       History   Social History Narrative   HE DOES NOT HAVE ANY CHILDREN.   Review of Systems  Eyes: Negative for visual disturbance.  Respiratory: Negative for chest tightness and shortness of breath.   Cardiovascular: Negative for chest pain, palpitations and leg swelling.  Gastrointestinal: Negative for abdominal pain.  Genitourinary: Negative for  decreased urine volume and difficulty urinating.  Neurological: Negative for dizziness.       No HA       Objective:   Physical Exam  Constitutional: He is oriented to person, place, and time. He appears well-developed and well-nourished. No distress.  HENT:  Head: Normocephalic and atraumatic.  Mouth/Throat: Oropharynx is clear and moist.  Eyes: Pupils are equal, round, and reactive to light.  Neck: Normal range of motion. Neck supple.  Cardiovascular: Normal rate.   Pulmonary/Chest: Effort normal and breath sounds normal.  Abdominal: Soft. Bowel sounds are normal. There is no tenderness. There is no rebound and no guarding.  Musculoskeletal: He exhibits no edema.  Neurological: He is alert and oriented to person, place, and time.  Skin: Skin is warm and dry.  Psychiatric: He has a normal mood and affect.          Assessment & Plan:

## 2011-01-07 NOTE — Assessment & Plan Note (Signed)
No OVERT GIB. Hb stable. Will get labs from Dr. Everette Rank.  Follow up in 1 year.

## 2011-01-07 NOTE — Assessment & Plan Note (Signed)
Uncontrolled, DBP > 105. Pt Asx. No taking Norvasc.  Lose 10 more pounds within the next year. See Dr. Everette Rank for BP management, Jan 08, 2011 AT 10 AM. RX GIVEN FOR Norvasc 10 mg daily. Pt asked to pick up meds today and to start taking it.

## 2011-01-07 NOTE — Patient Instructions (Signed)
Lose 10 more pounds within the next year. See Dr. Everette Rank for BP management ASAP. Take Norvasc 10 mg daily. Will get labs from Dr. Everette Rank.  Follow up in 1 year.

## 2011-01-07 NOTE — Assessment & Plan Note (Signed)
TCS 2018

## 2011-01-13 NOTE — H&P (Signed)
NAME:  [name], [name] NO.:  [id number]   MEDICAL RECORD NO.:  [id number]          PATIENT TYPE:  INP   LOCATION:  A302                          FACILITY:  [facility]   PHYSICIAN:  Carole Civil, M.D.DATE OF BIRTH:  1951-02-19   DATE OF ADMISSION:  12/30/2006  DATE OF DISCHARGE:  LH                              HISTORY & PHYSICAL   CHIEF COMPLAINT:  Drainage from the left knee.   HISTORY:  This is a 60 year old male with chief complaint of draining of  his left knee.  He is status post total knee replacement complicated by  what we thought was wound infection.  He had the knee opened and  drained; it was a hematoma.  Cultures were negative.  He then  subsequently in the several weeks post-op developed another hematoma.  It was recommended that he undergo another procedure to evacuate it; he  declined.  He was warned that he was at risk of infection, but he did  not want to have surgery.   He presented to the emergency room after a week or two of mass forming  at the end of his previous surgical site incision and drainage.  He had  a temperature of over 101 and was tachycardic and was admitted for  further workup.  His white count was 5.5, neutrophil count 62%.  Sodium  was 138, potassium was 3.3, chloride was 103, carbon dioxide 27, glucose  92, BUN/creatinine 80 and 1.01.   REVIEW OF SYSTEMS:  Positive for fever/chills.  Negative for everything  else except the skin.  Did complain of a headache.   MEDICATIONS:  1. Vicodin.  2. Metoprolol 25 b.i.d.  3. Protonix 40 daily.  4. Folic acid 2 mg daily.   He was febrile, as stated.  He was tachycardic.  He is a somewhat  unkempt gentleman.  Normocephalic, atraumatic.  Neck supple.   On evaluation today his tachycardia had resolved.  Breath sounds were  clear.  Chest was nontender.  Abdomen was soft, nontender.  Neuro:  He  was alert and oriented x3.  His mood was pleasant.  Skin showed warmth  and drainage at  the inferior portion of the knee incision; there are  actually two incisions, one from a surgery 20 years prior, one  from recent knee surgery.  Near the area where they come together is  where the abscess is.  There is drainage from the knee as well.  Range  of motion is 0-45.  He also declined manipulation under anesthesia.   IMPRESSION:  Abscess beneath the skin.  Recommend incision and drainage.      Carole Civil, M.D.  Electronically Signed     SEH/MEDQ  D:  12/31/2006  T:  12/31/2006  Job:  [id number]

## 2011-01-13 NOTE — Group Therapy Note (Signed)
NAME:  KHALIB, KERNS NO.:  [id number]   MEDICAL RECORD NO.:  [id number]          PATIENT TYPE:  INP   LOCATION:  A312                          FACILITY:  [facility]   PHYSICIAN:  Salem Caster, DO    DATE OF BIRTH:  June 17, 1951   DATE OF PROCEDURE:  DATE OF DISCHARGE:                                 PROGRESS NOTE   [name] is a 60 year old African American male who presented to [facility] with complaint of weakness for two days.  The patient also  complained of gravity and dehydration.  The patient was also complaining  about abdominal pain.  The patient was seen by Dr. Aline Brochure for  debridement of surgical site for left knee replacement.  The patient was  put on antibiotics.  Had no symptoms at that time.  The patient  presented to the emergency room extremely hypotensive and lethargic.  The patient started on dopamine drip in the emergency room and his blood  pressure improved.  The patient was sent to the ICU for closer  monitoring.  Subsequently the patient's dopamine drip was stopped.  The  patient was continuing IV hydration.  The patient was started on IV  antibiotics presumably being septic from his left knee surgery.  Orthopedic surgery was consulted and has been following the patient and  is scheduled to do a right knee drainage presumably today.  Today the  patient is doing well. No major complaints.  Still complaining of knee  pain and the patient wanting to be discharged.   OBJECTIVE:  VITALS:  Today temperature is 99.7, pulse 108, respirations  20, blood pressure 147/75.  CARDIOVASCULAR:  Regular rate and rhythm, no murmurs, rubs or gallops.  LUNGS:  Clear to auscultation bilaterally.  No rales, rhonchi or  wheezing.  ABDOMEN: Soft, nontender, nondistended. Positive bowel sounds.  EXTREMITIES:  The patient has bilateral knee swelling, right is a little  greater than the left.  The patient does have a covering over his left  knee from  incision site.   LABORATORY DATA:  Sodium 138, potassium 3.7, chloride is 110, CO2 22,  glucose 101, BUN 7, creatinine 1.18.  White count 6.2, hemoglobin 8.9,  hematocrit 26.4, platelets is 134.  So far his blood cultures are  negative.   ASSESSMENT AND PLAN:  1. Sepsis.  The patient will continue to be on IV antibiotics at this      time which includes vancomycin and Rocephin.  The patient's blood      pressure seems to be stable at this time.  The patient does not      have a leukocytosis at this time.  The patient then had a low-grade      fever yesterday.  Seems to be trending down at this time.  2. Acute renal failure.  This seems to have resolved.  3. Bilateral knee swelling.  Right greater than left.  The patient is      scheduled to have his knee __________ today      by orthopedic surgery.  This is pending at  this time.  Anticipate      pending orthopedic surgery seeing the patient. The patient probably      could be discharged in the next few days if he is afebrile and he      needs to be given p.o. antibiotics.      Salem Caster, DO  Electronically Signed     SM/MEDQ  D:  08/30/2007  T:  08/30/2007  Job:  [id number]

## 2011-01-13 NOTE — Group Therapy Note (Signed)
NAME:  [name], [name] NO.:  [id number]   MEDICAL RECORD NO.:  [id number]          PATIENT TYPE:  INP   LOCATION:  A220                          FACILITY:  [facility]   PHYSICIAN:  Carole Civil, M.D.DATE OF BIRTH:  Jul 24, 1951   DATE OF PROCEDURE:  01/02/2007  DATE OF DISCHARGE:                                 PROGRESS NOTE   Randie was afebrile yesterday.  He was in no acute distress.  Had no  chest pain, shortness of breath or blood pressure problems.  Culture  results still pending.  He is scheduled for discharge to May 5.      Carole Civil, M.D.  Electronically Signed     SEH/MEDQ  D:  01/03/2007  T:  01/03/2007  Job:  [id number]

## 2011-01-13 NOTE — Assessment & Plan Note (Signed)
NAMEMarland Kitchen  Philip, King                  CHART#:  [id number]   DATE:  07/07/2008                       DOB:  1951/05/18   CHIEF COMPLAINT:  Followup anemia/gastric polyps.   PROBLEM LIST:  1. Large inflamed hyperplastic gastric polyps with mucosal erosion and      inflammatory exudate seen on EGD in April 2009.  2. Normocytic anemia, iron deficiency, and B12 deficiency.  3. Hypertension.  4. Gout.  5. Diverticular bleeding in 2008.   SUBJECTIVE:  The patient returns for followup office visit.  He is doing  well.  He denies any anorexia.  Denies any rectal bleeding or melena.  His stools are dark since he has been on iron.  He denies any shortness  of breath, chest pain, or fatigue.  His weight is steadily increasing.  He only consumes beer once a week or so per his report.  His bowel  movements have been normal, soft and brown.  He denies any diarrhea or  constipation.  He is on Poly Iron 150 mg b.i.d. and omeprazole 20 mg  daily and receives monthly B12 injections.   CURRENT MEDICATIONS:  Folic acid 2 mg daily, amlodipine/benazepril 5/2  mg daily, metoprolol 25 mg b.i.d., allopurinol 200 mg daily, furosemide  20 mg daily, vitamin D 100 mg daily, omeprazole 20 mg daily, Poly Iron  150 mg b.i.d., colchicine 0.6 mg b.i.d., monthly B12 injections, and  hydrocodone p.r.n.   ALLERGIES:  No known drug allergies.   OBJECTIVE:  VITAL SIGNS:  Weight 260 pounds, height 78 inches,  temperature 98.2, blood pressure 120/82, and pulse 80.  GENERAL:  He is an obese [place] American male who is alert, oriented,  pleasant, and cooperative, in no acute distress.  HEENT:  Sclerae clear and nonicteric.  Conjunctivae pink.  Oropharynx  pink and moist without any lesions.  CHEST:  Heart regular rate and rhythm.  Normal S1 and S2.  ABDOMEN:  Protuberant.  Positive bowel sounds x4.  No bruits  auscultated.  The liver is palpable 4 fingerbreadths below the right  costal margin.  He does have splenomegaly.   No rebound, tenderness, or  guarding.  EXTREMITIES:  Without edema.   ASSESSMENT:  The patient is a 60 year old male with history of multiple  inflamed hyperplastic gastric polyps with chronic gastritis with focal  intestinal metaplasia.  He does have hepatosplenomegaly on exam.  He has  history of chronic mixed anemia as above.   PLAN:  1. Check CBC and LFTs.  2. If he remains with persistent anemia, we would suggest followup EGD      for further evaluation.  3. If LFTs are elevated, we would suggest abdominal ultrasound for      further evaluation of his liver.   [id number]:  Pt needs to be contacted to shedule for abdominal U/S.  Last CT 12/08 no HSM.       Vickey Huger, N.P.  Electronically Signed     Caro Hight, M.D.  Electronically Signed    KJ/MEDQ  D:  07/07/2008  T:  07/07/2008  Job:  [id number]   cc:   Weston Settle, M.D.  Vickey Huger, N.P.

## 2011-01-13 NOTE — H&P (Signed)
NAME:  [name], [name] NO.:  [id number]   MEDICAL RECORD NO.:  [id number]          PATIENT TYPE:  AMB   LOCATION:  DAY                           FACILITY:  [facility]   PHYSICIAN:  Carole Civil, M.D.DATE OF BIRTH:  10-Apr-1951   DATE OF ADMISSION:  09/17/2008  DATE OF DISCHARGE:  LH                              HISTORY & PHYSICAL   CHIEF COMPLAINT:  Pain in the right knee.   MEDICAL HISTORY:  This is a 60 year old male with multiple medical  problems which include history of hypokalemia, GI hemorrhage, carpal  tunnel, depression, anxiety, hypertension, alcohol abuse, benign  prostatic hypertrophy, B12 deficiency, degenerative disk disease in the  lumbar spine, constipation, irritable bowel syndrome, peptic ulcer  disease, gastroesophageal reflux, and diverticulosis of colon who is  status post left knee replacement after arthritis developed and a  previously nailed tibia fracture.  Tibial nail was removed in a separate  procedure and over 20 years ago, he had medial collateral ligament  repair for knee injury.   He also has longstanding pain in his right knee.  He was treated with  multiple aspirations and steroid injections, but developed progressive  deformity, decreased joint space on x-ray with arthritis in the lateral  compartment, and poor function in the right knee, which required use of  a cane and continued pain management with narcotic pain medication.   The patient had a successful left total knee replacement and right total  knee.   We completed the informed consent process in the office.  The things  that were discussed has possible complications, although all  complications could not be listed or anticipated included bleeding,  infection, deep vein thrombosis, pulmonary embolus, infection with  requirement of revision, possible amputation.   Medications include  1. Omeprazole 20 mg daily.  2. Metoprolol 25 mg 2 times a day.  3. Zyloprim 100  mg one daily.  4. Thiamine 100 mg one daily.  5. Colchicine 0.6 mg one daily.  6. Folic acid 1 mg two daily.  7. Lotrel 10-20 mg daily.  8. Lasix 20 mg daily.   He is allergic to ASPIRIN.   SURGICAL PROCEDURES:  1. Left tibial nail removal.  2. Left knee ligament repair.  3. Right hand fracture.  4. Heart catheterization in 2007 with normal ejection fraction.  5. Cholecystectomy.  6. Incision and drainage of left knee.  7. Left total knee arthroplasty on June 29, 2006.  8. EGD April 2009, which showed gastric polyps.   FAMILY HISTORY:  His mother and father are deceased.  He has 5 siblings  and they have history of hypertension and kidney disease.   He has never smoked.  Denies drug use.  Does drink a half a pint on  weekends.  He says he has decreased that.   REVIEW OF SYSTEMS:  Negative for general, cardiac, respiratory, GI, GU,  neuro, musculoskeletal, endocrine, psych, derm, ENT, immunologic, and  lymphatic other than listed, but remember the patient is a poor  historian.   PHYSICAL EXAMINATION:  GENERAL:  This is a well-developed  male.  Normal  body habitus.  No major deformities.  His grooming is acceptable.  HEAD:  Normocephalic, atraumatic.  LUNGS:  Clear to auscultation.  HEART:  Rate and rhythm normal without murmurs, rubs, or gallops.  No  clicks.  ABDOMEN:  Normal bowel sounds.  His abdomen is soft with no masses.  No  organomegaly.  EXTREMITIES:  Pulse is normal in all 4 extremities and there is no  clubbing or cyanosis.  There is mild edema in his legs.  His joint exam  is listed below.  LYMPHATIC:  Lymph nodes were negative.  SKIN:  Normal incision healed on the left with a left total knee.  NEUROLOGIC:  Reflexes are intact.  Sensation remains normal.  He is  awake and alert.  He is oriented x3.  His mood is normal.  His affect is  flat.  MUSCULOSKELETAL:  An abnormal gait.  His left knee is stable.  His right  knee has 120 degrees of motion.  There is  lateral knee tenderness.  Extensor mechanism is intact and there is valgus malalignment.   IMPRESSION:  Right knee osteoarthritis, 715.96.   PLAN:  Right total knee arthroplasty on September 18, 2008, with a preop  date of September 14, 2008 and a postop date on October 02, 2008.      Carole Civil, M.D.  Electronically Signed     SEH/MEDQ  D:  09/17/2008  T:  09/18/2008  Job:  [id number]

## 2011-01-13 NOTE — Consult Note (Signed)
NAME:  [name], [name]                 ACCOUNT NO.:  [id number]   MEDICAL RECORD NO.:  [id number]          PATIENT TYPE:  INP   LOCATION:  A219                          FACILITY:  [facility]   PHYSICIAN:  Alison Murray, M.D.DATE OF BIRTH:  1951-05-14   DATE OF CONSULTATION:  DATE OF DISCHARGE:                                 CONSULTATION   REASON FOR CONSULTATION:  Elevated BUN and creatinine.   [name] is a 60 year old gentleman with past medical history of acute  renal failure, ATN in 2008, history of gout, BPH, degenerative joint  disease, presently was admitted to the hospital because of elevated BUN  and creatinine and difficulty making urine.  [name] was here about 10  days ago for expected right knee after total knee arthroplasty.  The  patient was admitted, managed, and discharged home.  Initially when he  was admitted, his creatinine was about 1.23, increased LFTs.  However,  improved and was sent home.  Presently, as stated above, he came back  with the complaints of decreased urine output, and 1 blood work was  found to have very high BUN and creatinine, hence admitted to the  hospital.  Presently, he denies any nausea or vomiting.  He states that  after he was discharged, he was not drinking much.  Since he has an  infection of the knee, the patient was also put on vancomycin to be  followed as an outpatient.  The patient except for an episode of acute  renal failure, he denies any history of kidney stones or any other  problems.   PAST MEDICAL HISTORY:  He has history of left knee repair, infected,  status post hardware removal.  Presently, he is on antibiotics.  History  of hypertension, history of GERD, history of gout, history of GI  bleeding, history of depression, history of BPH, history of alcohol  abuse, history of degenerative joint disease, history of irritable bowel  syndrome, history of peptic ulcer disease, and history of acute renal  failure thought to be  secondary to ATN.  He has also history of  pancreatitis and a history of also questionable necrotic liver versus  cirrhosis.   PAST SURGICAL HISTORY:  He has history of cholecystectomy, history of  right total knee replacement, history of infected knee and hardware  removal.  Presently on antibiotics.   MEDICATIONS:  At this moment consist of:  1. Allopurinol 100 mg p.o. daily.  2. Colchicine 0.6 mg p.o. daily.  3. Furosemide 20 mg p.o. daily.  4. Nu-Iron 150 mg p.o. b.i.d.  5. Lopressor 25 mg p.o. daily.  6. Protonix 40 mg p.o. daily.  7. IV fluids normal saline at 125 mL/hour.  8. Vitamin __________ 200 mg p.o. daily.  9. __________ on p.r.n. basis.   ALLERGIES:  He is allergic to ASPIRIN.   SOCIAL HISTORY:  He has history of alcohol use.  Presently, he is states  the last time drunk, was about 3 months ago, since then he did not drink  any alcohol.   FAMILY HISTORY:  There is a strong family  history of hypertension and  also kidney disease.   REVIEW OF SYSTEMS:  He denies any shortness of breath, dizziness, or  lightheadedness.  He does not have any nausea, vomiting.  Appetite is  okay.  He has no abdominal pain.  No urgency or frequency.  No swelling  of the legs.  His only complaint was when he was at home, he did not  make that much amount of urine.   PHYSICAL EXAMINATION:  GENERAL:  The patient is alert, in no apparent  distress.  VITAL SIGNS:  Temperature 98.2, pulse of 80, respiratory rate is 18.  Blood pressure 104/64.  His output is __________ 750.  CHEST:  Clear to auscultation.  No rales, no rhonchi, no egophony.  HEART:  Regular rate and rhythm.  No murmur.  No S3.  ABDOMEN:  Soft, positive bowel sounds.  Nontender.  No fullness of the  suprapubic area and no rebound tenderness.  EXTREMITIES:  No edema.   His white blood cell count is 6.6, hemoglobin 9.2, hematocrit 26.  His  sodium is 139, potassium 4.2, BUN is 33, creatinine is 7.06.  Yesterday,   creatinine was 7.2.  However, on February 5, his creatinine was 1.3.  However, his creatinine has been as high as 1.8 in January of this year.  His albumin is 2.8, calcium 8.6, phosphorus 4.4.  He had ultrasound on  August 29, 2008, which showed right kidney to be 11.8, left kidney to  be 12.4.   ASSESSMENT:  1. Renal insufficiency.  At this moment, seems to be acute.  His      creatinine seems to be very high as compared to his BUN at this      moment, and seems to be acute.  He is not on Bactrim and at this      moment, his potassium, CO2, calcium seems to be normal.  Not sure      whether he has rhabdo.  Hence, the etiology was with significant      increase in creatinine is not clear, but since he has history of      BPH and also possible dehydration, prerenal syndrome and      obstructive uropathy to be entertained as a possibility.  When he      came, his urine specific gravity was 1.02 which seems to be high.      Presently, he is nonoliguric.  The patient does not have any uremic      signs and symptoms.  2. Hypertension.  Blood pressure seems to be controlled very well.  3. History of gout.  He is on allopurinol and colchicine.  No recent      exacerbation.  4. History of infected knee, on antibiotics.  5. History of benign prostatic hypertrophy.  6. History of alcohol abuse.  Presently, he is not drinking.  7. History of degenerative joint disease.  8. History of irritable bowel syndrome.   RECOMMENDATIONS:  I agree with hydration.  We will increase his IV  fluids to 175 mL/hour and we will hold probably the allopurinol.  We  will hold also the p.o. Lasix.  We will do ultrasound of the kidney to  make sure we are not dealing with obstructive uropathy as the patient  has significantly increased creatinine in a shorter period.  We will  continue his other treatment and we will follow the patient.      Alison Murray, M.D.  Electronically Signed  BB/MEDQ  D:   10/18/2008  T:  10/18/2008  Job:  [id number]

## 2011-01-13 NOTE — Op Note (Signed)
NAME:  [name], SCHWARZ NO.:  [id number]   MEDICAL RECORD NO.:  [id number]          PATIENT TYPE:  INP   LOCATION:  [id number]                          FACILITY:  [facility]   PHYSICIAN:  Carole Civil, M.D.DATE OF BIRTH:  Jul 16, 1951   DATE OF PROCEDURE:  09/18/2008  DATE OF DISCHARGE:                               OPERATIVE REPORT   HISTORY:  This is a 60 year old male with multiple medical problems, has  longstanding pain in his right knee which was treated with multiple  aspirations and steroid injections.  However, he developed progressive  deformity deep in his joint with __________ compression on the lateral  compartment, poor function in terms of activities of daily living.  He  tried a cane and pain management with narcotic medication.  He failed  this treatment and opted for a total knee arthroplasty.  The informed  consent was completed in the office.   PREOPERATIVE DIAGNOSIS:  Osteoarthritis of the right knee.   POSTOPERATIVE DIAGNOSIS:  Osteoarthritis of the right knee.   PROCEDURE:  Right total knee arthroplasty.   IMPLANTS USED:  DePuy Sigma __________ at 40 g.   We used an On-Q PainBuster with Sensorcaine 0.25%.   SURGEON:  Carole Civil, MD   ASSISTANTS:  Daine Gip and Simonne Maffucci.   OPERATIVE FINDINGS:  Severe OA of the lateral compartment with complete  loss of cartilage throughout the condyle.  There was patellofemoral  arthritis and mild medial compartment arthrosis.   There was preop flexion and contraction.  Preop range of motion under  anesthesia was 120 degrees.   DETAILS OF PROCEDURE:  The patient was identified in the preop area and  vancomycin was started.  We used vancomycin due to his history of  methicillin resistant staph aureus infection in the opposite knee.  His  history and physical were updated and chart was reviewed.   The patient was taken to the operating room where he had a spinal  anesthetic and no  complications.  He was placed in supine and then a  Foley catheter was inserted sterilely.   The right leg was then wrapped from groin to ankle with DuraPrep and  draped sterilely.   Time-out procedure was completed.   The knee was then exsanguinated with a 6-inch Esmarch, tourniquet was  inflated to 300 mmHg where it stayed for 93 minutes.  The knee was  flexed and the tourniquet was inflated.   With the knee in flexion, a straight midline incision was made.  The  subcutaneous tissue was divided and medial arthrotomy was performed.  The patella was everted.  Medial and lateral menisci were removed.  The  ACL and PCL were resected.  A synovectomy was performed along with  removal of osteophytes.   The knee was flexed and subluxated forward and then tibial guide was set  for neutral proximal tibial cut with an 8-mm reference from the lateral  side.   However, I cut off the top of the block because the 8-mm cut seemed to  be too large.  I removed the proximal tibia  and then entered the femoral  canal with a drill bit.  We irrigated and suctioned, dry, passed an  intramedullary guide rod with the cutting block set for 10-mm resection  and 5-degree valgus.  We then made the initial cut and then had an  additional 2-mm cut to get a deep enough cut to remove it from the  lateral femoral condyle.   We sized the femur to a 4.5 pin for 4, set the rotation based on the  epicondyles to pin the cutting block in place at the 4 position.  We  made the 4 cuts, released the lateral and medial capsules, removed the  posterior bone with an osteotome and then checked the flexion and  extension gaps.  The lateral compartment was tight in extension and  therefore the iliotibial band was released with a pie-crusting technique  as well as from its insertion at the Gerdy tubercle until the lateral  side opened up and then extension block could be placed well in  extension.   With the knee in flexion,  the extension block fit well.  We then made  the box cut.  I did a trial reduction, set out tibial rotation, measured  the patella at 24, cut it down to 12 with a 35 mm peg-cutting guide and  drilled 3 peg holes.  We then reduced the knee.  The patella tracked  normally with no subluxation.  The knee got excellent flexion and  extension and there was no laxity.   The tibia was then reamed and punched and then we irrigated the knee,  dried the bone, made some extra holes in the lateral tibia and the  lateral femur to increase cement penetration.   Allow the bone dry, the cement was placed on the bone as well as the  prosthesis and the tibial tray was cemented in place along with the  femur and patella with the trial insert in place until the cement cured.  Excess cement was removed.  The knee was irrigated and the 10-mm insert  was placed.  The knee was taken through a range of motion.  Patella  tracked well.  No subluxation or dislocation.  No clicking or popping.  The joint was stable.   The soft tissues were injected with 30 mL of Marcaine 0.25% followed by  closure of the capsule with #1 Reglan in interrupted fashion.  We then  injected the joint with another 30 mL of Marcaine, placed a pain pump  catheter in the safe subcu space and closed with 0 Monocryl and staples.  We took an x-ray, which showed the prosthetic components to be in  excellent alignment.  We then took the knee through a final range of  motion and we had satisfactory motion and stability and patellofemoral  tracking.   Sterile dressing, Ace wrap, and cryo cuff was applied.  The patient was  taken to recovery room in stable condition.   Postop plan is for the routine total knee protocol with early  weightbearing and Coumadin for DVT prophylaxis along with foot pumps and  TED hose.      Carole Civil, M.D.  Electronically Signed     SEH/MEDQ  D:  09/18/2008  T:  09/18/2008  Job:  [id number]

## 2011-01-13 NOTE — Consult Note (Signed)
NAME:  [name], [name]                 ACCOUNT NO.:  [id number]   MEDICAL RECORD NO.:  [id number]          PATIENT TYPE:  INP   LOCATION:  IC03                          FACILITY:  [facility]   PHYSICIAN:  Alison Murray, M.D.DATE OF BIRTH:  1951-01-23   DATE OF CONSULTATION:  DATE OF DISCHARGE:                                 CONSULTATION   REASON FOR CONSULTATION:  Renal insufficiency.   HISTORY OF PRESENT ILLNESS:  Mr. Landman is 60 year old African American  with past medical history of gout, hypertension, history of left knee  replacement and infected drain a couple of times before.  According to  the patient, he had done treatment of his knee and he went home.  He  started having some nausea and also vomiting, diarrhea and he started  having severe weakness and came to the emergency room.  In the emergency  room, the patient was found to be hypotensive.  Hence he was admitted to  the hospital for further workup and management.  When blood work was  done and since his BUN and creatinine was increased consult was called.  At this moment, the patient feels okay.  He does not have any nausea or  vomiting.  The patient denies any previous history of renal failure.  According to his chart, he seems to have previous acute renal failure  but at this moment no history of chronic renal insufficiency and also no  history of a kidney stone.   PAST SURGICAL HISTORY:  1. History of hypertension.  2. History of left knee replacement.  3. History of infection and draining.  4. History of GERD.  5. History of gout.  6. History of diverticulosis.  7. History of pancreatitis.   SURGICAL HISTORY:  1. History of cholecystectomy.  2. History of left knee replacement.   FAMILY HISTORY:  His father died at the age of 78.  Mother died at the  age of 75.  The patient states his brother has history of high blood  pressure and kidney problem.  He does not know what the etiology is.   SOCIAL HISTORY:   History of smoking, but no history of illicit drug use.  Also, he might have history of occasional alcohol use.   ALLERGIES:  No known allergies of that.   MEDICATIONS:  At this moment consist of:  1. Folate 100 mg p.o. daily.  2. Folic acid 2 mg p.o. daily.  3. Protonix 40 mg IV q.24 h.  4. Heparin 5000 units subcu b.i.d.  5. IV fluid normal saline at 150 mL per hour.  6. Vancomycin IV.  7. Dopamine.  8. Diuretics and norepinephrine also to be titrated for his      hypotension.  9. Benazepril 5/20 mg p.o. daily.  10.Amlodipine.  11.Allopurinol.  12.Metoprolol.   REVIEW OF SYSTEMS:  Presently, he feels better.  He does not have any  nausea, no vomiting.  No shortness of breath, dizziness or  lightheadedness.  He denies also any diarrhea.   PHYSICAL EXAMINATION:  VITAL SIGNS:  Blood pressure 110/78 heart rate is  96,  febrile.  HEENT:  No conjunctival pallor none icterus.  Oral mucosa seems to be  dry.  NECK:  Supple without JVD.  CHEST:  Decreased breath sounds, otherwise seems to be no rales or  rhonchi.  No egophony.  HEART:  Reveals regular rate and rhythm.  No murmur.  ABDOMEN:  Soft, positive bowel sounds.  EXTREMITIES:  No edema.  He has a small dressing on his medial part of  his left knee with significant surgical scar.   LABORATORY DATA:  Blood work:  White blood cell count is 6.6, hemoglobin  is 8.8, hematocrit 28.4 and platelet of 120.  Sodium is 177, potassium  5.1, CO2 of 15, BUN is 40, creatinine is 5.1, calcium is 7.9.  His uric  acid is 8.1.  Alcohol level of 55.  UA specific gravity is 1.020, pH of  5.  He has a moderate blood and 30 mg protein.  He has also nitrite  positive and he has some leukocyte.   ASSESSMENT:  1. Renal insufficiency at this moment seems to be acute as the patient      has a normal creatinine before this admission on October.  The      etiology at this moment seems to be possibly prerenal syndrome as      the patient has high urine  specific gravity, hypotension, history      of nausea or vomiting and diarrhea.  However, ATN also need to be      entertained as a possibility.  His BUN and creatinine seems to be      coming down.  2. Hypotension secondary to a possible sepsis.  May be related to his      urine UTI.  However, since he has development of his knee and      previous infection, at this moment the source could be also because      of his knee.  His creatinine in October was 0.93.  3. Low CO2, possibly metabolic problem.  4. History of gout.  He was on allopurinol.  5. History of pancreatitis.  6. History of probably alcohol abuse.  The patient with elevated      alcohol level with history of pancreatitis.  7. History of thrombocytopenia.  8. History of infected left knee replacement.   RECOMMENDATIONS:  Will check his ABG.  Will check his IV fluids to half-  normal saline with sodium bicarb at 175 mL per hour.  Will continue his  other treatments.  Will do ultrasound of his kidneys, and I will follow  patient.  If his urine output does not improve, probably will use some  diuretics.      Alison Murray, M.D.  Electronically Signed     BB/MEDQ  D:  08/26/2007  T:  08/26/2007  Job:  [id number]

## 2011-01-13 NOTE — Group Therapy Note (Signed)
NAME:  [name], [name]                 ACCOUNT NO.:  [id number]   MEDICAL RECORD NO.:  [id number]         PATIENT TYPE:  PINP   LOCATION:  A323                          FACILITY:  [facility]   PHYSICIAN:  Salem Caster, DO    DATE OF BIRTH:  May 15, 1951   DATE OF PROCEDURE:  10/20/2008  DATE OF DISCHARGE:                                 PROGRESS NOTE   SUBJECTIVE:  [name] continues to be stable.  The patient has no  complaints.  He does not complain of any leg pain, shortness of breath,  abdominal pain, or chest pain.  The patient is stable.   OBJECTIVE:  VITAL SIGNS:  Temperature is 98.5, pulse is 80, respirations  20, blood pressure 119/72, and he is sating 97% on room air.  CARDIOVASCULAR:  Regular rate and rhythm.  No murmurs, rubs, or gallops.  LUNGS:  Clear to auscultation bilaterally.  No rales, rhonchi, or  wheezes.  ABDOMEN:  Soft, nontender, and nondistended.  Positive bowel sounds.  No  rigidity or guarding.  EXTREMITIES:  No clubbing, cyanosis, or edema are noted.   LABORATORY DATA:  Sodium 139, potassium 4.3, chloride 110, CO2 is 23,  glucose 95, BUN 32, and creatinine 7.04.  White count 5.6, hemoglobin  8.9, hematocrit 26.8, and platelet count 151,000.   ASSESSMENT AND PLAN:  1. Acute on chronic renal failure.  The patient continues to be      followed by Nephrology.  Per Nephrology, the patient has urinary      output, his fluids have been decreased, and we will continue to      monitor.  2. For urinary tract infection, the patient's renal function is still      dramatically elevated.  We will get pharmacy's advice regarding      what antibiotic the patient could be placed on at this time.  3. Hypertension.  The patient is stable, his blood pressure is in good      range.  4. History of gastroesophageal reflux disease.  The patient is on      Protonix, we will continue at this time.  5. The patient will be maintained on deep venous thrombosis as well as  gastrointestinal prophylaxis.      Salem Caster, DO  Electronically Signed     SM/MEDQ  D:  10/20/2008  T:  10/20/2008  Job:  [id number]

## 2011-01-13 NOTE — Discharge Summary (Signed)
NAME:  [name], CAPPEL NO.:  [id number]   MEDICAL RECORD NO.:  [id number]          PATIENT TYPE:  INP   LOCATION:  IC09                          FACILITY:  [facility]   PHYSICIAN:  Bonnielee Haff, MD     DATE OF BIRTH:  [birth year]   DATE OF ADMISSION:  DATE OF DISCHARGE:  07/14/2008LH                               DISCHARGE SUMMARY   DISPOSITION:  The patient is being transferred to [facility] ICU.   Please review H&P dictated by Dr. Rolena Infante for details regarding the  patient's presenting illness.   Dictation ended at this point.   PLEASE REVIEW THE NEXT NOTE      Bonnielee Haff, MD  Electronically Signed     GK/MEDQ  D:  03/14/2007  T:  03/14/2007  Job:  [id number]

## 2011-01-13 NOTE — Assessment & Plan Note (Signed)
NAMEMarland King  KAEDAN, DIGGLES                    CHART#:  [id number]   DATE:  04/15/2007                       DOB:  04/10/1951   REFERRING PHYSICIAN:  Kizzie Fantasia, MD.   PROBLEM LIST:  1. Lower GI bleed secondary to diverticulosis.  2. Hypertension.  3. Alcohol abuse.   SUBJECTIVE:  [name] is a new patient visit to our clinic.  He was  last seen and evaluated in 1997.  He was admitted for a lower GI bleed  in July 2008.  He has bled approximately three times from his  diverticulosis.  When he presented to the emergency department, he was  having problems with hypotension and continued lower intestinal  bleeding.  He was transferred to [facility] for nuclear medicine  scan/angiogram.  The bleeding scan showed no definite evidence for  active GI bleeding.  He had been maintained as an outpatient on aspirin.  He denies any bleeding since his discharge and feels pretty good.  He  did have a colonoscopy performed by Dr. Oretha Caprice, which showed no  active bleeding.  It was a complete colonoscopy to the cecum.  Colonoscopy was performed after he had no bleeding for 24 to 48 hours.  He was noted to have a nodule in his rectum 2 to 3 cm from the anus that  was biopsied.  Pathology showed mild reactive changes, no adenomatous  changes.  He was scheduled followup with me.  His colonoscopy also  showed multiple large-mouth diverticulum through the entire colon.  He  has no particular questions, concerns, or complaints today.   PAST MEDICAL HISTORY:  1. Hypertension.  2. Gout.  3. Chronic PPI use.   FAMILY HISTORY:  He has no family history of colon cancer or colon  polyps.   ALLERGIES:  No known drug allergies.   MEDICATIONS:  1. Cipro, Nexium, or folic acid.  2. Amlodipine.  3. Toprol.  4. Allopurinol now.  5. Protonix 40 mg daily.  6. Lasix 20 mg daily.  7. B-1.  8. Over-the-counter medicine daily.   SOCIAL HISTORY:  He had a friend that brought him here today.  He lives  close to the office.  He does not have any children.   PHYSICAL EXAMINATION:  VITAL SIGNS:  Weight 230 pounds, 6 foot 6 inches,  temperature 98.6, blood pressure 120/70, pulse 90.  GENERAL:  He is in no apparent distress, alert and oriented x4.HEENT:  Atraumatic and normocephalic, pupils equal and reactive to light, mouth  no oral lesions, posterior pharynx without erythema or exudate.NECK:  Full range of motion, no lymphadenopathy.LUNGS:  Clear to auscultation  bilaterally.CARDIOVASCULAR:  Regular rhythm, no murmur, normal S1 and  S2.  ABDOMEN:  Bowel sounds are present, soft and nontender and  nondistended, no rebound or guarding, no abdominal bruits or pulsatile  masses.  EXTREMITIES:  Without cyanosis, clubbing, or edema.NEURO:  He  seems to have some mild cognitive impairment, but no focal neurologic  deficits.   ASSESSMENT:  [name] is a 60 year old male, who reports having a  diverticular bleed three times.  His last nuclear medicine scan did not  identify if his bleeding was coming from the left or right colon.  The  diverticulosis involves his entire colon.  Thank you for allowing me to  see [name]  in consultation.  My recommendations follow.   RECOMMENDATIONS:  1. [name] should avoid aspirin indefinitely.  2. Surgery should be considered if he rebleeds.  If the source of      bleeding is not able to be identified then a total colectomy would      be in order.  I explained this to [name].  3. He may follow up with [facility] as needed.       Caro Hight, M.D.  Electronically Signed     SM/MEDQ  D:  04/15/2007  T:  04/15/2007  Job:  [id number]   cc:   Weston Settle, M.D.

## 2011-01-13 NOTE — Assessment & Plan Note (Signed)
NAME:  MAEL, MILCH                  CHART#:  [id number]   DATE:  04/04/2008                       DOB:  [birth year]   REFERRING PHYSICIAN:  Weston Settle, MD   PROBLEM LIST:  1. Large inflamed-hyperplastic gastric polyps with mucosal erosion and      inflammatory exudate seen on EGD in April 2009.  This is the source      of his normocytic anemia and decreased iron stores.  2. Hypertension.  3. Gout.  4. History of diverticular bleed in 2008.   SUBJECTIVE:  The patient is a 60 year old male who presents as a return  patient visit.  He was last seen in April 2009.  He was noted to have a  drop in his ferritin from 102 to 22 and an EGD was performed.  The EGD  revealed multiple gastric polyps with erosion and active lesion, which  is likely source of his normocytic anemia along with his renal  insufficiency.  He denies any blood in the stool or black tarry stools.  He is having no abdominal pain.  His appetite is good.  He is having no  problems of swallowing.  He stopped his iron in April 2009.   MEDICATIONS:  1. Folic acid.  2. Amlodipine.  3. Metoprolol.  4. Allopurinol.  5. Furosemide.  6. B1.  7. Omeprazole 20 mg daily.  8. Colchicine.  9. B12 injections monthly.  10.Hydrocodone as needed.   OBJECTIVE:  VITAL SIGNS:  Weight 257 pounds (up 27 pounds since August  2008), height 6 feet 6 inches, BMI 29.7 (overweight), temperature 99.3,  blood pressure 122/90, and pulse 80.GENERAL:  He is in no apparent  distress.  Alert and interactive.LUNGS:  Clear to auscultation  bilaterally.CARDIOVASCULAR:  Regular rhythm.  No murmurs.  ABDOMEN:  Bowel sounds are present.  Soft, nontender, and  nondistended.NEUROLOGIC:  He has no focal neurologic deficits.   ASSESSMENT:  The patient is a 60 year old male who has gastric polyps  that are likely contributing to his anemia.  He is currently off iron  and not interested in endoscopy.  Thank you for allowing me to see the patient in  consultation.  My  recommendations follow.   RECOMMENDATIONS:  1. I discussed the management options with the patient.  He agreed to      have hemoglobin and hematocrit and if his hemoglobin is still low,      then he should restart his iron for the next 3 months.  Will      recheck his hemoglobin and his hematocrit in 3 months, and if it      continues to be low, then he needs an EGD to have removal of all of      the polyps in his stomach if possible.  The polyps are mucosally      based.  2. He should continue his omeprazole.  3. He has an outpatient visit with me in 3 months.  I did encourage      him to lose 10-20 pounds over the next 3-6 months.       Caro Hight, M.D.  Electronically Signed     SM/MEDQ  D:  04/04/2008  T:  04/04/2008  Job:  [id number]   cc:   Weston Settle, M.D.

## 2011-01-13 NOTE — Assessment & Plan Note (Signed)
NAMEMarland Kitchen  [name], [name]                    CHART#:  [id number]   DATE:  09/27/2007                       DOB:  July 30, 1951   REFERRING PHYSICIAN:  Weston Settle, M.D.   PROBLEM LIST:  1. Lower gastrointestinal bleeding secondary to diverticulosis.  2. Hypertension.  3. Alcohol abuse.  4. Gout.   SUBJECTIVE:  [name] is a 60 year old male who presents as a return  patient visit.  He was recently hospitalized for septic arthritis.  He  denies any rectal bleeding or black, tarry stools.  He has had no  problems swallowing.  He denies heartburn, indigestion, abdominal pain,  or vomiting.  He has had no alcohol since Christmas.  His left knee  still has some mild soreness.  He has two bowel movements a day.  He  denies any diarrhea or constipation.   MEDICATIONS:  1. Folic acid.  2. Amlodipine.  3. Metoprolol.  4. Allopurinol.  5. Lasix.  6. B1.  7. Multivitamin.  8. Omeprazole 20 mg daily.  9. PolyIron 150 mg b.i.d.  10.Levaquin 750 mg daily.  11.Potassium chloride.  12.Colchicine.  13.B12 injections monthly.   OBJECTIVE:  Weight 230 pounds (unchanged since August, 2008).  Height 6  foot 6.  BMI 26.6 (slightly overweight).  Temperature 98, blood pressure  110/70.  Pulse 88.  In general, he is in no apparent distress, alert and  oriented x4.  Lungs are clear to auscultation bilaterally.  Cardiovascular:  Regular rhythm.  No murmur.  Abdomen:  Bowel sounds are  present, soft, nontender, nondistended.  No rebound or guarding.   ASSESSMENT:  [name] is a 60 year old male who has a normocytic  anemia.  His hemoglobin was stable while he was hospitalized.  He has no  evidence of active bleeding.  The most likely etiology for his anemia is  bone marrow suppression from alcohol.  He had a complete colonoscopy in  August, 2008.  He has never had an upper endoscopy.  Thank you for  allowing me to see [name] in consultation.  My recommendations  follow.   RECOMMENDATIONS:  1.  Will check ferritin, hemoglobin and hematocrit.  If he has evidence      of iron-deficiency anemia, then we will perform an EGD.  2. He should continue on some omeprazole.  3. He has a follow-up appointment to see me in three months.       Caro Hight, M.D.  Electronically Signed     SM/MEDQ  D:  09/27/2007  T:  09/28/2007  Job:  [id number]   cc:   Weston Settle, M.D.

## 2011-01-13 NOTE — Discharge Summary (Signed)
NAME:  MACRAE, DEBAUN NO.:  [id number]   MEDICAL RECORD NO.:  [id number]          PATIENT TYPE:  INP   LOCATION:  [id number]                          FACILITY:  [facility]   PHYSICIAN:  Carole Civil, M.D.DATE OF BIRTH:  06/29/1951   DATE OF ADMISSION:  10/02/2008  DATE OF DISCHARGE:  02/07/2010LH                               DISCHARGE SUMMARY   HISTORY:  He was admitted with wound drainage from the right knee,  status post right total knee arthroplasty on September 18, 2008.  At that  point, he stayed in the hospital 3 days after his initial surgery and  was into the office on October 01, 2008, with wound drainage from the  proximal portion of the wound, which appeared to be coming from imbedded  staples in the skin, there was serous drainage.  The patient had a fever  of 99 recorded by the home care nurse.  No fever once admitted to the  hospital.  He was admitted to start IV antibiotics, get a PICC line, and  to follow his creatinine as it had bumped up to 2.0 according to his  medical doctor.   ADMITTING DIAGNOSIS:  Wound drainage, right knee, cellulitis right knee.   DISCHARGE DIAGNOSIS:  Wound drainage, right knee, cellulitis right knee.   ADMITTING PHYSICIAN:  Carole Civil, MD   PROCEDURE:  Insertion of PICC line.   HOSPITAL COURSE:  He was admitted as stated.  He was started on IV  vancomycin therapy via protocol.  His temperature on admission was 98.9.  Initial laboratory studies, his white count was 13.  His neutrophil  count was 76%.  His absolute neutrophils were 9.9.  His creatinine was  2.05.  His sed rate was 129 and his C-reactive protein was 8.2.   We adjusted dry dressings as a wound was still closed.  We followed his  creatinine and C-reactive protein and sed rate.  On October 05, 2008,  his creatinine was down to 1.23.  His sed rate had decreased to 70 on  October 04, 2008 and his C-reactive protein had bumped up to 30.2, but  clinically  his wound improved with less drainage over time.   So, he will be discharged home with home health vancomycin for total of  42 days from his initial dose.  He will be discharged on the following  medications  1. To be discharged on vancomycin started with 2 g per day per      protocol.  He had an excellent trough of 18 with that dosing      regimen.  2. To be discharged on one multivitamin a day.  3. Metoprolol 25 mg twice a day.  4. Allopurinol 100 mg daily.  5. Thiamine 100 mg daily.  6. Colchicine 0.6 mg daily.  7. Folic acid 1 mg twice a day.  8. Lasix 20 mg daily.  9. Poly-Iron 150 mg twice a day.  10.Percocet 5/325 one tablet q.4. p.r.n. for pain.  11.Robaxin 500 mg q.6. p.r.n.   He will be followed on Thursday in the office  at 10:30 for wound check  and if everything looks good at that point, we can resume his physical  therapy.   His range of motion at this 0-85 degrees.  He is ambulatory with a  walker, full weight bearing, and not complaining of any significant  pain.      Carole Civil, M.D.  Electronically Signed     SEH/MEDQ  D:  10/07/2008  T:  10/07/2008  Job:  [id number]

## 2011-01-13 NOTE — H&P (Signed)
NAME:  [name], [name] NO.:  [id number]   MEDICAL RECORD NO.:  [id number]          PATIENT TYPE:  INP   LOCATION:  A219                          FACILITY:  [facility]   PHYSICIAN:  Salem Caster, DO    DATE OF BIRTH:  [birth year]   DATE OF ADMISSION:  10/17/2008  DATE OF DISCHARGE:  LH                              HISTORY & PHYSICAL   CHIEF COMPLAINT:  Abnormal renal function.   HISTORY OF PRESENT ILLNESS:  This is a 60 year old African American male  who presents from home.  Apparently, the patient had a right total knee  replacement back in January.  The patient subsequently had a wound  infection and was placed on IV vancomycin.  Subsequent blood work showed  the patient had an elevated BUN and creatinine, so the patient was sent  to the hospital to be evaluated.  The patient denies any nausea,  vomiting, chest pain, shortness of breath or flank pain.  The patient  does admit to not drinking much water recently.   PAST MEDICAL HISTORY:  1. Hypertension.  2. GERD.  3. Gout.  4. History of GI bleeding.  5. Depression.  6. BPH.  7. Alcohol abuse.  8. Degenerative joint disease.  9. Irritable bowel syndrome.  10.Peptic ulcer disease.  11.Pancreatitis.  12.Questionable necrotic liver versus cirrhosis.   PAST SURGICAL HISTORY:  1. Cholecystectomy.  2. Right total knee replacement.  3. Right knee infection.   HOME MEDICATIONS:  1. Allopurinol 100 mg daily.  2. Colchicine 0.6 mg daily.  3. Furosemide 20 mg daily.  4. Nu-Iron 50 mg twice a day.  5. Lopressor 25 mg daily.  6. Folic acid 1 mg twice a day.  7. Percocet 5/325 mg 1 tab every 4 hours as needed.  8. Robaxin 500 mg q.6 h., as needed.  9. Vancomycin 2 grams daily.  10.Multivitamin 1 tablet daily.   ALLERGIES:  ASPIRIN.   SOCIAL HISTORY:  History of alcohol abuse.  Last mention of alcohol was  3 months prior.  No history of illicit drug use or tobacco abuse.   FAMILY HISTORY:  Positive for  hypertension and kidney disease.   REVIEW OF SYSTEMS:  GI:  Unremarkable.  RESPIRATORY:  Unremarkable.  CARDIOVASCULAR:  Unremarkable.  MUSCULOSKELETAL:  Unremarkable.  All  other systems are unremarkable.   PHYSICAL EXAMINATION:  VITAL SIGNS:  Temperature is 98.2, pulse 80,  respirations 18, blood pressure 104/64.  Saturating 94% on room air.  GENERAL:  Well-oriented and well-developed in no acute distress.  HEENT:  Head is atraumatic, normocephalic.  Eyes are PERRLA.  EOMs.  NECK:  Soft, supple, nontender, nondistended.  CARDIOVASCULAR:  Regular rate and rhythm.  No murmurs, rubs or gallops.  LUNGS:  Clear to auscultation.  No rales, rhonchi or wheezing.  ABDOMEN:  Soft, nontender, nondistended.  No organo-splenomegaly.  Positive bowel sounds.  EXTREMITIES:  His right knee is just slightly warm to touch.  No pain on  palpation.  No swelling.  Left lower extremity with no clubbing,  cyanosis or edema.  NEUROLOGIC:  Cranial nerves II  through XII are grossly intact.  The  patient is alert and oriented x3.   LABORATORY DATA:  Phosphorus is 4.4, magnesium 2.0.  Sodium 139,  potassium 4.2, chloride 108, CO2 is 23, glucose 117, BUN 33, creatinine  0.76, total bilirubin 0.9, alkaline phosphatase 52, AST 18, ALT 11,  total protein 6.6, albumin 2.8, PTT is 34, PT 15.5.  INR is 1.2.  White  count is 6.6, hemoglobin 9.2, hematocrit 26.8.  Platelet count is  158,000.  Urinalysis microscopically shows many bacteria, 7-10 RBCs, 3-6  WBCs.  Urinalysis with normal leukocytes, nitrites are negative, trace  of blood, trace of protein.  Renal ultrasound showed findings compatible  with massive renal medical disease, no hydronephrosis.   ASSESSMENT/PLAN:  1. Acute renal failure, possibly secondary to vancomycin toxicity.  We      will have pharmacy dose his vancomycin at this time.  Nephrology      has also been consulted.  His colchicine and allopurinol have been      held at this time.  We will  await anymore nephrology      recommendations.  The patient's IV fluids have been also increased.  2. For his history of hypertension, the patient will be kept on his      beta-blocker at this time.  We will continue to monitor his blood      pressure.  3. History of gastroesophageal reflux disease.  The patient will      continue on Protonix on a daily basis.  4. The patient will be on DVT, as well as GI prophylaxis.  We will      follow the patient carefully.      Salem Caster, DO  Electronically Signed     SM/MEDQ  D:  10/18/2008  T:  10/18/2008  Job:  [id number]

## 2011-01-13 NOTE — Procedures (Signed)
NAME:  [name], [name] NO.:  [id number]   MEDICAL RECORD NO.:  [id number]          PATIENT TYPE:  INP   LOCATION:  A312                          FACILITY:  [facility]   PHYSICIAN:  Cristopher Estimable. Lattie Haw, MD, FACCDATE OF BIRTH:  08-18-1951   DATE OF PROCEDURE:  08/30/2007  DATE OF DISCHARGE:                                ECHOCARDIOGRAM   CLINICAL DATA:  This is a 60 year old gentleman with hypotension.  1. Technically adequate echocardiographic study.  2. Normal left atrium, right atrium and right ventricle.  3. Normal diameter of the proximal ascending aorta; mild calcification      of the wall.  4. Normal in trileaflet aortic valve; normal mitral valve with mild      annular calcification.  5. Normal tricuspid valve with physiologic regurgitation and estimated      RV systolic pressure at the upper limit of normal.  6. Normal pulmonic valve and proximal pulmonary artery.  7. Normal IVC.  8. Normal internal dimension, wall thickness, regional and global      function of the left ventricle.      Cristopher Estimable. Lattie Haw, MD, [facility]  Electronically Signed     RMR/MEDQ  D:  08/31/2007  T:  09/01/2007  Job:  [id number]

## 2011-01-13 NOTE — H&P (Signed)
NAME:  PRESTEN, BEH NO.:  [id number]   MEDICAL RECORD NO.:  [id number]          PATIENT TYPE:  INP   LOCATION:  [id number]                          FACILITY:  [facility]   PHYSICIAN:  Carole Civil, M.D.DATE OF BIRTH:  [birth year]   DATE OF ADMISSION:  10/02/2008  DATE OF DISCHARGE:  LH                              HISTORY & PHYSICAL   CHIEF COMPLAINT:  Wound drainage, right knee.   HISTORY:  A 60 year old male status post right total knee arthroplasty  on September 18, 2008, was discharged September 21, 2008, presented to the  office on February 1 with the wound drainage from the proximal portion  of the wound.  The wound also had imbedded staples in the skin with  serous drainage.  The patient had fever recorded by the home care nurse.  We attempted to treat him with IV antibiotics as an outpatient, but we  saw his medical doctor today.  His creatinine had bumped up to 2.0.  He  does have some renal disease and it was felt that he would be better  managed on a vancomycin protocol in the hospital.   He has several medical problems which include history of hypokalemia, GI  hemorrhage, carpal tunnel syndrome, depression, anxiety, hypertension,  alcohol abuse, benign prostatic hypertrophy, B12 deficiency,  degenerative disk disease of lumbar spine, history of constipation,  irritable bowel syndrome, peptic ulcer disease, gastroesophageal reflux,  and diverticulitis of the colon.  He is status post left knee  arthroplasty after fractured tibia with removal of the nail.  He also  had a previous medial collateral ligament injury treated over 20 years  ago with medial repair, which had to have a hardware removed.   He developed infection in his left knee wound as well that was treated  with IV antibiotics and drainage of abscess.   MEDICINES:  1. Omeprazole 20 mg a day.  2. Metoprolol 25 mg 2 times a day.  3. Zyloprim 100 mg a day.  4. Thiamine 100 mg a day.  5.  Colchicine 0.6 mg a day.  6. Folic acid 1 mg twice a day.  7. Lotrel 10-20 mg daily.  8. Lasix 20 mg a day.   ALLERGIES:  He is allergic to ASPIRIN.   SURGICAL PROCEDURES:  Left tibial nail removal and left knee ligament  repair, right hand fracture, OTIF, heart catheterization in 2007 with  normal ejection fraction, cholecystectomy, incision and drainage of left  knee, left knee total arthroplasty on June 29, 2006, and EGD in April  2009 which showed gastric polyps.   FAMILY HISTORY:  Mother and father are deceased.  He has 5 siblings.  History of hypertension and kidney disease in the family.  The patient  has never smoked.  Denies drug use.  He does drink a half a pint of  alcohol on weekends.   No other symptoms noted on review of systems.   Exam, he has a temperature of 98.7.  He is a well-developed male, normal  body habitus.  No major deformities.  Grooming is acceptable.  Head normocephalic, atraumatic.  Lungs, clear.  Heart, rate and rhythm  normal.  No gallops or rubs.  Abdomen, normal bowel sounds.  Abdomen is  soft.  Lymphatic system shows negative lymph nodes in the left and right  groin.  Neurologic shows reflexes are intact.  Sensation remains normal.  He is awake and alert.  He is oriented x3.  His mood is normal.  His  affect is flat.   His right knee shows that the superior portion of the wound has opened  up.  There is serous drainage.  There is redness and erythema around the  wound.  His range of motion is 8-80 degrees.  He has no joint effusion.  No pain with passive range of motion.   IMPRESSION:  Wound infection right knee, status post total knee  arthroplasty.   PLAN:  IV vancomycin.  Check response over several days.  If improved,  continue for 2 weeks.  If not improved, incision, drainage and  arthrotomy of the knee with polyethylene exchange.      Carole Civil, M.D.  Electronically Signed     SEH/MEDQ  D:  10/02/2008  T:   10/03/2008  Job:  [id number]

## 2011-01-13 NOTE — Discharge Summary (Signed)
NAME:  [name], AGE NO.:  [id number]   MEDICAL RECORD NO.:  [id number]          PATIENT TYPE:  INP   LOCATION:  [id number]                          FACILITY:  [facility]   PHYSICIAN:  Anselmo Pickler, DO    DATE OF BIRTH:  [birth year]   DATE OF ADMISSION:  08/25/2007  DATE OF DISCHARGE:  01/08/2009LH                               DISCHARGE SUMMARY   ADDENDUM DISCHARGE   Original discharge summary done on January 5 by Dr. Maryland Pink.  The  patient was originally scheduled to be discharged to a nursing facility.  However, it was found that patient was having a knee septic arthritis.  At that point in time, he was started on antibiotics.  He was started on  antibiotics vancomycin by pharmacy.  He was seen by social services, and  it was determined that he needed antibiotic therapy while this waited,  and he waiting for placement.  He remained stable.  His medications did  not change.  The patient remained stable, and when placement was found,  he was then sent to the nursing home.      Anselmo Pickler, DO  Electronically Signed     CB/MEDQ  D:  10/10/2007  T:  10/10/2007  Job:  [id number]

## 2011-01-13 NOTE — Consult Note (Signed)
NAME:  [name], [name] NO.:  [id number]   MEDICAL RECORD NO.:  [id number]          PATIENT TYPE:  INP   LOCATION:  A220                          FACILITY:  [facility]   PHYSICIAN:  Cristopher Estimable. Lattie Haw, MD, [place] OF BIRTH:  Dec 18, 1950   DATE OF CONSULTATION:  DATE OF DISCHARGE:                                 CONSULTATION   PRIMARY CARE PHYSICIAN:  Dr. Lidia Collum.   PRIMARY CARDIOLOGIST:  Dr. Mathis Bud.   HISTORY OF PRESENT ILLNESS:  Dr. Aline [name] and [name]'s request for  consultation is greatly appreciated concerning this 60 year old  gentleman with hypertension and electrocardiographic abnormalities.  Mr.  [name] underwent a left total knee arthroplasty in 2007.  He has  subsequently developed infection and was admitted to hospital electively  for incision and drainage.  He was noted to have frequent PVCs and minor  EKG abnormalities, prompting this request for consultation, prior to  planned hospital discharge.   [name] was evaluated by Dr. Mathis Bud also for EKG abnormalities and  hypertension.  An echocardiogram revealed LVH with normal LV systolic  function, mild left atrial enlargement and unspecified mitral valve  abnormalities.  He reportedly subsequently underwent cardiac  catheterization with unremarkable results; those records have been  requested.  He has felt well with no cardiopulmonary symptoms since.   In the hospital, cardiac markers have been negative.  Potassium was  replaced with a decrease in the frequency of PVCs.  No significant  arrhythmias have been recorded.   PAST MEDICAL HISTORY:  Is otherwise notable for hypertension that has  been under good control with medical therapy.  There is a remote history  of excessive alcohol use, pancreatitis and gastritis.   MEDICATIONS:  The patient's medications prior to admission included:  1. Amlodipine 5 mg daily.  2. Benazepril 10 mg daily.  3. Metoprolol 25 mg b.i.d.  4. Protonix 40 mg daily.  5. Folate 1 mg daily.  6. Thiamine 100 mg daily.   ALLERGIES:  NO KNOWN DRUG ALLERGIES.   FAMILY HISTORY:  One of his brothers has diabetes, hypertension and  peripheral vascular disease.    No prior nor current use of tobacco products.   REVIEW OF SYSTEMS:  Prior surgeries also include cholecystectomy, ORIF  of the right wrist.  Vaccinations are up-to-date.  There is a history of  mild anemia, GERD and gout.  He walks with the assistance of a cane and  sometimes a walker.  All other systems reviewed and are negative.   EXAM:  GENERAL:  Pleasant gentleman, lying comfortably in bed.  VITAL SIGNS:  The temperature is 97, blood pressure 110/70, heart rate  90 and regular, respirations 20, O2 saturation 97%, weight 97 kg.  HEENT:  Anicteric sclerae.  NECK:  No jugular venous distension; no carotid bruits.  LUNGS:  Few left basilar rales.  CARDIAC:  Normal first and second heart sounds; fourth heart sound  present and minimal systolic ejection murmur.  ABDOMEN:  Soft and nontender; normal bowel sounds; no organomegaly.  EXTREMITIES:  Distal pulses intact; trace edema.   EKG:  Normal sinus  rhythm; PVCs; left atrial abnormality; probable LVH;  minor nonspecific ST-T wave abnormalities.   Other laboratory notable for negative cardiac markers, a repeat  potassium of 3.9 and normal renal function.   IMPRESSION:  [name] has done well following minor surgery on his left  knee.  Frequent PVCs in the postoperative period may have reflected mild  hypokalemia and physiologic stress.  He had no significant arrhythmias,  and his basic cardiac testing was negative.  No further evaluation is  necessary.  He may be discharged.  He does not need routine cardiology  followup.      Cristopher Estimable. Lattie Haw, MD, [facility]  Electronically Signed     RMR/MEDQ  D:  01/03/2007  T:  01/03/2007  Job:  [id number]

## 2011-01-13 NOTE — Discharge Summary (Signed)
NAME:  [name], [name] NO.:  [id number]   MEDICAL RECORD NO.:  [id number]          PATIENT TYPE:  INP   LOCATION:  [id number]                          FACILITY:  [facility]   PHYSICIAN:  Carole Civil, M.D.DATE OF BIRTH:  [birth year]   DATE OF ADMISSION:  09/18/2008  DATE OF DISCHARGE:  01/22/2010LH                               DISCHARGE SUMMARY   ADMITTING DIAGNOSIS:  Osteoarthritis, right knee.   DISCHARGE DIAGNOSIS:  Osteoarthritis, right knee.   OPERATIVE PROCEDURE:  Right total knee arthroplasty with a DePuy Sigma  posterior stabilized total knee implant.   HISTORY:  A 60 year old male with longstanding osteoarthritis of his  right knee, failed conservative treatment which included multiple  injections, oral analgesics, multiple aspirations, use of a cane.  The  patient opted for a right total knee arthroplasty.   HOSPITAL COURSE:  Came in on September 18, 2008, underwent same-day  surgery with a right total knee arthroplasty under spinal technique,  tolerated that well with minimal blood loss.   Postoperatively, he had an excellent course, he ambulated early, had  excellent flexion early, had no real complications.   We did hold his Norvasc and Lotensin in the postop period due to a  slightly low blood pressure.   His dressing was changed on postop day #2, although it was soaked from  the initial postop dressing, it was clean and dry after that and he did  well.   His laboratory studies are as follows:  BUN and creatinine are 18 and  1.75 on the 22nd, hemoglobin 8.9 on the 22nd, prothrombin time was 2.0  on Coumadin.   He remained afebrile throughout his course.   DISCHARGE INSTRUCTIONS:  Will have physical therapy with home health  three times a week.  He has opted not to use a CPM machine.  He is  ambulatory with a walker.   He will have the staples out in 2 weeks on February 2nd.  He also has a  follow-up appointment scheduled for that same day for  wound check,  possible staple removal if not already done.   He is full weightbearing.   He will be on the following medications:  1. Allopurinol 100 mg daily.  2. We will hold his Norvasc 10 mg daily.  3. Hold Lotensin 20 mg daily.  4. Colchicine 0.6 mg daily.  5. Folic acid 2 mg daily.  6. Lasix 20 mg daily.  7. Nu-Iron 150 mg p.o. two times daily.  8. Lopressor 25 mg p.o. two times daily.  9. Multivitamin once daily.  10.Percocet tablet one q.4 p.r.n. for pain.  11.Robaxin 500 mg p.o. q.6 p.r.n. for muscle spasms.   He will be discharged to home.  His overall condition is stable and  improved.      Carole Civil, M.D.  Electronically Signed     SEH/MEDQ  D:  09/21/2008  T:  09/21/2008  Job:  [id number]

## 2011-01-13 NOTE — Discharge Summary (Signed)
NAME:  Philip, King NO.:  [id number]   MEDICAL RECORD NO.:  [id number]          PATIENT TYPE:  INP   LOCATION:  3302                         FACILITY:  [facility]   PHYSICIAN:  Chesley Mires, MD        DATE OF BIRTH:  1951/06/17   DATE OF ADMISSION:  03/14/2007  DATE OF DISCHARGE:  03/17/2007                         DISCHARGE SUMMARY - REFERRING   DISCHARGE DIAGNOSES:  1. Acidosis which has resolved.  2. Thrombocytopenia secondary to history of alcohol abuse.  3. Lower gastrointestinal bleed with diverticulosis status post      colonoscopy on March 16, 2007.  4. Hypertension.  5. History of gout.   HISTORY OF PRESENT ILLNESS:  [name] is a 60 year old African  American gentleman who presented to [facility] originally with  a lower GI bleed, hypotension with a known history of EtOH abuse with a  new onset of renal insufficiency.  At [facility] he had a right  femoral line placed along with a right radial arterial line placed.  He  was transferred to [facility]. [facility] on March 14, 2007,  with diagnosis of lower GI bleed.  He was noted to have bloody stools  with mixed stools and blood.  As noted, he became hypotensive and  required fluid resuscitation.  He had been started on Levophed and  vasopressin.  Due to the complexity of his care and inability of [facility] to manage him, he was transferred to [facility] for further evaluation and treatment.   LABORATORY DATA:  A pH was 7.42, pCO2 46, pO2 168.  Subsequent arterial  blood gas with pH 7.39, pCO2 40, pO2 102.  A wbc was 7.3, hemoglobin 10,  hematocrit 29.9, platelets 75 with low of 53.  INR 1.3, PTT 24.  Sodium  136, potassium 3.3 with a low of 2.9, chloride 105, CO2 20, glucose 98,  BUN 3, creatinine 0.89, peak creatinine was 3.19.  Total protein 4.9,  albumin 2.1, AST 28, ALT 18, ALP 36, total bilirubin 0.9.  Magnesium  1.9.  CK 214, CK-MB  2.4, troponin I 0.105.  Cortisol level 23.8.  Sodium  urine 136.  He is A positive.   Nuclear medicine gastrointestinal bleeding scan shows no active GI  bleed.   Chest x-ray showed cardiomegaly, bibasilar atelectasis, no evidence of  pneumothorax following central line placement.   A 12-lead EKG showed normal sinus rhythm.   PROCEDURES:  1. Initially he had a right femoral central venous line which was      placed on March 14, 2007, removed on March 14, 2007.  2. Right radial arterial line placed on March 14, 2007, removed on March 15, 2007.  3. Left IJ central line placed on March 14, 2007, subsequently removed      on March 17, 2007.  4. He also had a colonoscopy performed by Dr. Milus Banister which      showed diverticulosis with no active GI bleed.   HOSPITAL COURSE BY DISCHARGE  DIAGNOSIS:  DISCHARGE DIAGNOSIS #1 - LOWER  GASTROINTESTINAL BLEED:  He was initially evaluated with a bleeding scan  which was negative.  He underwent, on March 16, 2007, a colonoscopy which  was performed by Dr. Owens Loffler, that showed diverticulosis with no  further bleeding.  He did require volume resuscitation with 2 units of  packed cells for hemorrhagic shock and subsequently his vasopressors  were weaned off.   DISCHARGE DIAGNOSIS #2 -  METABOLIC ACIDOSIS WITH ACUTE RENAL FAILURE:  His acute renal failure has resolved.  His metabolic acidosis has  resolved.  Also his magnesium has been replaced.  His renal function has  continued to improve and is no longer an issue.   DISCHARGE DIAGNOSIS #3 -  HISTORY OF LEFT TOTAL KNEE REPLACEMENT WITH  METHICILLIN RESISTANT STAPHYLOCOCCUS AUREUS INFESTATION:  This has  resolved, although he is placed on isolation protocol per infectious  disease protocol.   DISCHARGE DIAGNOSIS #4 -  HYPOKALEMIA:  Potassium went to a low of 2.3,  it was 3.3 by the day of discharge and was continuing to be replaced on  an outpatient basis.  She will follow up with  Dr. Jonna Munro at the  [place], internal medicine clinic, phone number is  [phone], on March 22, 2007, at 3:30 p.m. for further evaluation and  treatment.   DISCHARGE DIAGNOSIS #5 -  HYPERTENSION:  He was formerly on lisinopril.  This has been stopped and can be restarted by his primary care  physician, Dr. Jonna Munro.   DISCHARGE DIAGNOSIS #6 -  HISTORY OF GOUT:  He has been on Allopurinol  in the past.  This has been stopped while in the hospital.  Again, this  can be evaluated by his primary care physician to be restarted if  needed.   DIET:  Low sodium heart healthy diet.   MEDICATIONS:  1. Metoprolol 12.5 mg one half tablet of 25 mg twice a day.   DISPOSITION/CONDITION ON DISCHARGE:  Improved.      Philip Lambert, MSN, ACNP      Chesley Mires, MD  Electronically Signed    SM/MEDQ  D:  03/17/2007  T:  03/17/2007  Job:  [id number]   cc:   Milus Banister, MD  Fenton Malling. Lucia Gaskins, M.D.  Weston Settle, M.D.

## 2011-01-13 NOTE — Discharge Summary (Signed)
NAME:  [name], [name]                 ACCOUNT NO.:  [id number]   MEDICAL RECORD NO.:  [id number]          PATIENT TYPE:  INP   LOCATION:  A305                          FACILITY:  [facility]   PHYSICIAN:  Bonnielee Haff, MD     DATE OF BIRTH:  1951-01-09   DATE OF ADMISSION:  10/17/2008  DATE OF DISCHARGE:  02/24/2010LH                               DISCHARGE SUMMARY   PRIMARY MEDICAL DOCTOR:  Dr. Jonna Munro.  The patient is also followed by  Dr. Ples Specter for his right knee infection.   During this hospitalization, the patient was seen in consultation with  Dr. Lowanda Foster.   Please see history and physical dictated by McDonald for details  regarding the patient's presenting illness.   DISCHARGE DIAGNOSES:  1. Acute renal failure, likely related to vancomycin, improving.  2. Right knee infection, stable.  3. History of hypertension, stable.   BRIEF HOSPITAL COURSE:  Briefly, this is a 60 year old African American  male who had blood work prior to hospitalization which revealed an  elevated BUN and creatinine.  His BUN was elevated at 33, creatinine was  7.28.  The patient was seen by nephrology.  It was felt that the  elevated BUN and creatinine was secondary to vancomycin.  No other  etiology was determined.  The patient's Lasix was discontinued and he  was started on IV fluids.  He slowly has improved.  His BUN has come  down to 22, creatinine is 3.7.  Dr. Lowanda Foster discussed the case with me  this morning and he felt that the patient could go home.  The patient  clinically is looking great.  He denies any complaints at this time.  He  is making adequate amounts of urine.  He is tolerating p.o. diet.   Regarding his vancomycin, this was discontinued by Dr. Aline Brochure a few  days ago.  I spoke to him today, and he feels that the patient does not  require vancomycin at this time.  He will follow the patient in his  office and determine at that time.   He was also found to have a UTI, and  he grew E-coli sensitive to  cefazolin.  The patient was initially on ceftriaxone and we will  discharge him on Keflex.   The rest of his medical issues are stable.   This morning, the patient is feeling well, no complaints are offered.   PHYSICAL EXAMINATION:  VITAL SIGNS:  Show a temperature of 99.1, blood  pressure 124/84, heart rate is 79, respiratory rate is 20.  Saturation  98% on room air.  LUNGS:  Clear to auscultation bilaterally.  CARDIOVASCULAR:  S1-S2 is normal, regular.  ABDOMEN:  Soft, nontender, nondistended.   Laboratory data have been discussed earlier.  So basically, the patient  is stable for discharge home.  Since he does need vancomycin, his med  line will be discontinued.   DISCHARGE MEDICATIONS:  1. Keflex 500 mg b.i.d. for 5 days.  2. He may continue the rest of his home medications, but he needs to      discontinue the  vancomycin.   MEDICATIONS AT HOME:  1. Multivitamin 1 tablet daily.  2. Metoprolol 25 mg b.i.d.  3. Allopurinol 100 mg daily.  4. Thiamine 100 mg daily.  5. Colchicine 0.6 mg daily.  6. Folic acid 1 mg b.i.d.  7. Lasix 20 mg daily.  8. Nu-Iron 150 mg b.i.d.  9. Percocet 5/325 one tablet every 4-6 h., as needed.  10.Robaxin 500 mg every 6 h., as needed.   FOLLOWUP:  1. Dr. Aline Brochure.  He will set it up.  2. Dr. Lowanda Foster in 2-3 weeks.  3. Dr. Jonna Munro in 1-week.  4. BMET on October 29, 2008, with results to Dr. Lowanda Foster.   DIET:  Renal diet.   PHYSICAL ACTIVITY:  As before, he uses a cane.   IMAGING STUDIES:  Included an ultrasound of the kidneys which showed  nonspecific medical renal disease without any evidence for  hydronephrosis.   Total time on this discharge encounter 35 minutes.      Bonnielee Haff, MD  Electronically Signed     GK/MEDQ  D:  10/24/2008  T:  10/24/2008  Job:  [id number]   cc:   Alison Murray, M.D.  Fax: [phone]   Weston Settle, M.D.

## 2011-01-13 NOTE — Consult Note (Signed)
NAME:  [name], [name] NO.:  [id number]   MEDICAL RECORD NO.:  [id number]          PATIENT TYPE:  INP   LOCATION:  2316                         FACILITY:  [place]   PHYSICIAN:  Fenton Malling. Lucia Gaskins, M.D.  DATE OF BIRTH:  01-12-1951   DATE OF CONSULTATION:  03/14/2007  DATE OF DISCHARGE:                                 CONSULTATION   REASON FOR CONSULTATION:  Lower GI bleed.   HISTORY OF PRESENT ILLNESS:  This is a 60 year old black male who  presented with a history of lower GI bleed that he says started on  Friday, the 11th of July 2008.  It looks like, however, he was not  admitted to [facility] until the 13th of July 2008.   Because of instability, he was transferred down to [place] and I was asked to see him in consultation in managing his GI  bleed.  From a GI standpoint, he apparently had an uneventful  laparoscopic cholecystectomy by Dr. Tamala Julian in October of 2003.  He gives  a history of having known diverticular disease, had apparently an  endoscopy and colonoscopy in 2003.  He has had no prior GI bleed though  he does have also a history of some pancreatitis and alcohol abuse in  the past.   ALLERGIES:  He has no allergies.   MEDICATIONS:  His admission medications to [facility] included:  1. Lopressor 25 mg daily.  2. Protonix 40 mg daily.  3. Hydrocodone.  4. Allopurinol 200 mg daily.  5. Amlodipine 5 mg daily.  6. Benazepril 10 mg daily.   REVIEW OF SYSTEMS:  He has never had a seizure or loss of consciousness.  PULMONARY:  No pneumonia or tuberculosis.  CARDIAC:  He has had  hypertension which has been treated.  He  apparently had an echocardiogram in 2007 which shows some mild left  ventricular hypertrophy.  GASTROINTESTINAL:  See history of present illness.  He does have a  history of gastroesophageal reflux disease for which he is on the  Protonix.  RENAL:  He comes in with an elevated creatinine to 3.2 and the chart has  a  mention of rhabdomyolysis though I cannot find specifically when this  happened.  MUSCULOSKELETAL:  He had a left knee replacement.  Multiple problems  dating from October 2007 to more recently May of 2008.   SOCIAL HISTORY:  He has been disabled and not working for a number of  years.  He lives by himself.  He is not married.  He does have a  brother, Clarence Haisley.  Carrolyn Meiers cell phone is [phone].   PHYSICAL EXAMINATION:  VITAL SIGNS:  His temperature is 97.8, blood  pressure 117/76, pulse is 100.  He is on Norepinephrine and vasopressin.  GENERAL:  He is a bearded black gentleman.  He is a little bit hard to  understand because of his accent.  HEENT:  Unremarkable.  NECK:  Supple with no mass or thyromegaly.  LUNGS:  Clear to auscultation.  HEART:  Regular rate and rhythm without murmur or  rub.  ABDOMEN:  Soft.  He has a scar from his prior lap cholecystectomy.  He  has no tenderness, guarding. No organomegaly or mass.  While in the room  he did have a mucusy bloody stool.  EXTREMITIES:  He has scars on his left knee from his prior surgery but  good strength in the upper and lower extremities.   LABORATORY DATA:  Sodium 135, potassium 3.3, chloride 105, BUN 16,  creatinine 3.19.  His albumin is 2.1.  Total bilirubin 2.5, alkaline  phosphatase of 37.  His troponin and CPK MB were normal.  Hemoglobin is  10.8, hematocrit 31, white blood cell count of 18,000.  Platelet count  of 53,000.   IMPRESSION:  1. Lower GI bleed.  He has an nasogastric tube which shows clear bile      but he is still passing blood so I am assuming this is from the      colon and most likely diverticular disease with his history of      diverticulosis.  Agree with current management of pressors, IV      fluids, resuscitation.  He is scheduled for a tagged red blood cell      scan later today which may give some idea of where this bleeding is      coming from.  2. Thrombocytopenia.  Again, his platelet  count was 53,000.  He is      transfused or given platelets.  History of ETOH abuse.  It is      unknown whether he has much in the way of liver disease though his      liver functions actually do not look too bad.  3. Hypertension.  4. Chronic renal insufficiency with a creatinine of 3.2.  Part of this      may be dehydration.  5. History of pancreatitis.  6. History of left knee replacement complications.  7. History of gout.  8. Gastroesophageal reflux disease.      Fenton Malling. Lucia Gaskins, M.D.  Electronically Signed     DHN/MEDQ  D:  03/14/2007  T:  03/14/2007  Job:  [id number]   cc:   Chesley Mires, MD  Merry Lofty, MD

## 2011-01-13 NOTE — H&P (Signed)
NAME:  [name], [name]                 ACCOUNT NO.:  [id number]   MEDICAL RECORD NO.:  [id number]          PATIENT TYPE:  INP   LOCATION:  A220                          FACILITY:  [facility]   PHYSICIAN:  Merry Lofty, MD   DATE OF BIRTH:  Nov 08, 1950   DATE OF ADMISSION:  12/30/2006  DATE OF DISCHARGE:  LH                              HISTORY & PHYSICAL   PRIMARY MEDICAL DOCTOR:  The patient stated follow he follows only with  Dr. Aline Brochure.   REASON FOR CONSULTATION:  Abnormal EKG.   HISTORY OF PRESENT ILLNESS:  [name] is a 60 year old man with a  history of hypertension, status post left knee replacement, status post  incision and drainage for abscess.  Today, the patient has incision and  drainage for left knee abscess by Dr. Aline Brochure.  And incidentally the  EKG was found to have an abnormality and the hospitalist group was  consulted.  I interviewed the patient.  The patient stated that he has  no any symptom, no chest pain, no palpitation, no history of dizziness.  He has no history of heart problems, except hypertension.  He stated  that he had procedure cardiac catheterization in 2007, by cardiologist,  and he was he had a normal heart.  The patient's blood pressure looks  fairly controlled since yesterday.  That is the record we have.  And, he  is asymptomatic.  He has no urinary or GI complaint currently, except  pain on the left knee.   REVIEW OF SYSTEMS:  A 10-point review of system is noncontributory,  except as detailed in the HPI.   ALLERGIES:  NO KNOWN DRUG ALLERGIES.   PAST MEDICAL HISTORY:  1. Hypertension.  2. Left knee arthritis, status post knee replacement, status post      incision and drainage for abscess.  3. Alcohol abuse with a history of pancreatitis and gastritis.   MEDICATION:  He is on:  1. Norvasc 5 mg p.o. daily.  2. Benazepril 10 mg p.o. daily.  3. Metoprolol 25 mg p.o. b.i.d.  4. Protonix 40 mg p.o. daily.  5. Folic acid 1 mg p.o. daily.  6. Thiamine 100 mg p.o. daily  7. He is on morphine 4 mg IV every 2 hours p.r.n. in the hospital.  8. And, Zofran 4 mg IV every 4 hours p.r.n. for nausea and vomiting in      the hospital.  9. Cefazolin 1 gram IV every 8 hours starting today.   PHYSICAL EXAMINATION:  GENERAL:  The patient is lying in the bed without  any respiratory distress, only complaining of mild pain on his left  knee.  VITAL SIGNS:  Blood pressure is 121/70, and temperature is 98, pulse  rate is 84, respiratory rate is 20, saturation is 110%.  HEENT:  He has pink conjunctivae.  Nonicteric sclera.  NECK:  Supple.  CHEST:  He has good air entry bilaterally, clear.  CVS:  S1-S2 well-heard regular.  No murmur.  ABDOMEN:  Soft.  No organomegaly.  EXTREMITIES:  He has a incision and drainage on the left knee  and  dressing.  He has no pedal edema on both extremities.  CNS:  He is alert and oriented.   LABORATORY:  CBC is 6.2, hemoglobin is 12.6, hematocrit is 37.7,  platelet count is 182.  On the chemistry:  Sodium is 136, potassium 3.3,  chloride is 101, and bicarb is 28, glucose is 106, BUN 6, calcium is  8.8.  Blood culture is negative.  EKG showed a rate of 88 per minute,  with premature ventricular complexes, with T-wave inversion __________  to PVCs.   ASSESSMENT:  1. Abnormal EKG on asymptomatic patient with frequent PVCs.  And, the      plan is we will put the patient on a monitor and repeat EKGs to      evaluate the significance of these abnormalities, and we will      consult tomorrow cardiologist as well, and the echocardiogram will      be done as well.  2. Hypokalemia. The patient is already on calcium replacement.  We      will repeat it and will monitor the potassium level.  3. Hypertension, fairly controlled.  The patient is status post      incision and drainage for a left knee abscess.  Dr. Aline Brochure is      managing him by himself.  The patient will be re-evaluated also by      the hospitalist  group tomorrow.      Merry Lofty, MD  Electronically Signed     MT/MEDQ  D:  01/01/2007  T:  01/01/2007  Job:  [id number]

## 2011-01-13 NOTE — Group Therapy Note (Signed)
NAME:  [name], [name] NO.:  [id number]   MEDICAL RECORD NO.:  [id number]          PATIENT TYPE:  INP   LOCATION:  [id number]                          FACILITY:  [facility]   PHYSICIAN:  Carole Civil, M.D.DATE OF BIRTH:  April 16, 1951   DATE OF PROCEDURE:  DATE OF DISCHARGE:                                 PROGRESS NOTE   He is on vancomycin IV for what we think is a superficial wound  infection status post right total knee arthroplasty.  He is improving in  terms of overall appearance of his wound.  His last white count shows a  white count of 5.4, his white count had gotten to a high of 13.0.  His  sed rate on October 04, 2008, is 39 which is down from 129.  C-reactive  protein is 8.2, on October 02, 2008.   His incision looks much improved.  There is no drainage or tenderness of  the knee.  His passive range of motion is now 0-85.   He is having difficulties in terms of his transportation and we are  going keep him here another day.  He does have home health setup to  start when he goes home for total of 42 days of antibiotics IV with  vancomycin on vancomycin protocol.   Addendum:  His last creatinine was 1.23, which is down from initial  creatinine of 2.05.      Carole Civil, M.D.  Electronically Signed     SEH/MEDQ  D:  10/06/2008  T:  10/07/2008  Job:  [id number]

## 2011-01-13 NOTE — Op Note (Signed)
NAME:  [name], [name] NO.:  [id number]   MEDICAL RECORD NO.:  [id number]          PATIENT TYPE:  AMB   LOCATION:  DAY                           FACILITY:  [facility]   PHYSICIAN:  Carole Civil, M.D.DATE OF BIRTH:  July 27, 1951   DATE OF PROCEDURE:  06/24/2007  DATE OF DISCHARGE:                               OPERATIVE REPORT   HISTORY:  A 60 year old male, status post left tibial nailing  approximately 6-8 years ago followed by removal of nail in June 2007.  He then went on to have a total knee replacement in October 2007 for  degenerative arthritis.  He developed a hematoma, had incision, drainage  and evacuation of hemarthrosis on August 06, 2006, culture negative.  He then had a second incision and drainage of the wound in May 2008 and  presented this week with purulent drainage from the end of the incision.  Of note, the patient had an attempted medial collateral ligament repair  some 30 years ago and at the apex of the 2 incisions, he has had  recurrent infections, no infection in the knee joint, no pain in the  knee joint.  He presents for incision and drainage.   PREOPERATIVE DIAGNOSIS:  Abscess, left leg.   POSTOPERATIVE DIAGNOSIS:  Abscess, left leg.   PROCEDURE:  Incision and drainage, left leg.   SURGEON:  Carole Civil, M.D.   ASSISTANT:  No assistants.   ANESTHETIC:  Spinal.   OPERATIVE FINDINGS:  Purulent material and one 0 Monocryl suture which  had not been absorbed.  There was only a small amount of purulence; it  seemed to be superficial.  The wound was explored manually and digitally  and found to have no collections of purulence in the proximal and distal  portions of the incision.   DESCRIPTION OF PROCEDURE:  The patient was identified preoperatively.  The left leg was marked for surgery, countersigned by the surgeon and  the history and physical was updated.  The patient had a spinal  anesthetic.  After sterile prep and drape  with Betadine, a time-out  procedure was completed.  No tourniquet was used.   A straight incision was made in line with the previous skin incision at  the area of purulence; this was taken down to bone.  The wound was  cultured and explored with digital manipulation and dissection.  There  was no other purulent  material.  The wound was packed open and dressed sterilely.  We used a 1  inch Betadine-soaked gauze to pack the wound.  The patient was taken to  recovery room.  He will continue on Levaquin and I will see him in the  office next week.  He was given a few Tylox for pain.      Carole Civil, M.D.  Electronically Signed     SEH/MEDQ  D:  06/24/2007  T:  06/25/2007  Job:  [id number]

## 2011-01-13 NOTE — Consult Note (Signed)
NAME:  Philip, King NO.:  [id number]   MEDICAL RECORD NO.:  [id number]          PATIENT TYPE:  INP   LOCATION:  IC03                          FACILITY:  [facility]   PHYSICIAN:  [name]. Philip Haw, MD, FACCDATE OF BIRTH:  1951-07-08   DATE OF CONSULTATION:  08/26/2007  DATE OF DISCHARGE:                                 CONSULTATION   REFERRING PHYSICIAN:  Dr. Leory King.   PRIMARY CARE PHYSICIAN:  Dr. Carrie King.  Primary cardiologist, previously  Dr. Mathis King, more recently Dr. Lattie King.   HISTORY OF PRESENT ILLNESS:  A 60 year old gentleman with a history of  hypertension and hypertensive heart disease referred for evaluation of  PVCs and ventricular bigeminy.  [name] has no additional cardiac  history.  He underwent an echocardiogram and cardiac catheterization in  mid 2007, both of which were normal except for the presence of LVH.  He  has had no recent chest discomfort nor dyspnea.  I evaluated him in May  of this year for PVCs after an incision and drainage of an infected left  knee prosthesis.  Only hypokalemia was noted at that time.  He is now  referred for frequent PVCs and bigeminy after presenting with acute  renal failure and shock.  He dates the onset of this illness to only 4  days ago when he had some GI symptoms.  He eventually developed diarrhea  and nausea with emesis for which he was seen by his primary care  physician.  On Christmas day, he felt weak, but was able to visit his  sister and to eat something.  He was subsequently admitted with a  systolic blood pressure approximately 70 and a creatinine of 6.85 after  a normal creatinine in May.  He has responded well to fluid  resuscitation.  He was given dopamine transiently with an increase in  ventricular ectopy.  He was subsequently treated with norepinephrine and  currently is receiving no pressors.   PAST MEDICAL HISTORY:  1. Alcoholism with recurrent GI bleeds attributed to diverticular   disease.  2. GERD.  3. Gout.  4. Peptic ulcer disease.  5. Arthritis.  6. He has required recurrent surgeries for infection of his knee      prosthesis.  7. He continues to use alcohol, although he claims to have decreased      consumption.  8. Left TKA.  9. Cholecystectomy.   Family history, social history and review of systems were updated.  No  notable changes from May were identified.  He denies any history of  cigarette use, although the chart indicates  prior cigarette smoking.   PHYSICAL EXAMINATION:  GENERAL:  A pleasant gentleman with a mild speech  impediment in no acute distress.  VITAL SIGNS:  Heart rate is 100 and regular, respirations 18, blood  pressure 105/60.  Afebrile.  HEENT:  Anicteric sclerae.  NECK:  No jugular venous distention.  LUNGS:  Clear.  CARDIAC:  Normal S1, S2.  S4 present.  ABDOMEN:  Soft and nontender.  Normal bowel sounds.  No organomegaly.  EXTREMITIES:  Distal pulses  intact.  Trace edema.   LABORATORY DATA AND X-RAY FINDINGS:  EKG with sinus tachycardia,  nonspecific T-wave abnormality.  Chest x-ray with mild cardiomegaly,  normal lung fields.  CT of the abdomen and pelvis with minor atelectasis  at the lung bases, prior cholecystectomy, diverticulosis without any  acute changes and question of a minimal hiatal hernia.  Nonspecific  stranding of the mesentery raises the question of mesenteritis.   Laboratory otherwise notable for a hemoglobin of 8.8, decreased from  10.3 on admission, platelets were 146,000, where they have been as low  as 60,000 on previous admissions.  His urinalysis is negative.  Urine  culture and blood cultures are pending.   IMPRESSION:  [name] presents with a noncardiac illness.  His  premature ventricular contractions are likely due to the stress of that  illness as well as the use of beta agonists.  He is doing well with  current therapy.   RECOMMENDATIONS:  I do not believe that he needs additional  cardiac  testing and will be available to assist with his care as necessary.  In  light of moderate anemia with a falling hematocrit, acute chronic GI  blood loss should be excluded.      [name]. Philip Haw, MD, [facility]  Electronically Signed     RMR/MEDQ  D:  08/26/2007  T:  08/27/2007  Job:  [id number]

## 2011-01-13 NOTE — Group Therapy Note (Signed)
NAME:  PINCHES, ZARRELLA NO.:  [id number]   MEDICAL RECORD NO.:  [id number]          PATIENT TYPE:  INP   LOCATION:  A305                          FACILITY:  [facility]   PHYSICIAN:  Salem Caster, DO    DATE OF BIRTH:  1950/12/06   DATE OF PROCEDURE:  10/23/2008  DATE OF DISCHARGE:                                 PROGRESS NOTE   Mr. Volkov appears to be stable.  He has no complaints and is improving.   VITAL SIGNS:  Temperature is 98.5, pulse is 96, respirations 18, blood  pressure 109/60, saturating 99% on room air.  CARDIOVASCULAR:  S1, S2 regular.  No rubs or murmurs.  LUNGS:  Clear to auscultation bilaterally.  No rales, rhonchi or  wheezing.  ABDOMEN:  Soft, nontender, nondistended.  EXTREMITIES:  No clubbing, cyanosis or edema.   LABS:  White count 6000, hemoglobin 8.7, hematocrit 26.0, platelet count  166,000.  Sodium 139, potassium 4.0, chloride 106, C02 26, glucose 86,  BUN 27, creatinine 4.49.   ASSESSMENT AND PLAN:  1. Acute on chronic renal failure.  The patient is being followed by      urology.  His renal function is slowly improving and he is being      continued with current treatment.  2. He has a urinary tract infection.  The patient is on Rocephin.      Will continue that at this time.  The patient will need to be      switched to p.o. prior to being discharged.  3. The patient does have a history of gastroesophageal reflux disease.      He is on a proton pump inhibitor.  4. The patient has a history of gout.  5. Depression.   The patient is stable.  Will continue to monitor.      Salem Caster, DO  Electronically Signed     SM/MEDQ  D:  10/23/2008  T:  10/23/2008  Job:  [id number]

## 2011-01-13 NOTE — Group Therapy Note (Signed)
NAME:  ANGELICA, STABLEIN NO.:  [id number]   MEDICAL RECORD NO.:  [id number]          PATIENT TYPE:  INP   LOCATION:  A322                          FACILITY:  [facility]   PHYSICIAN:  Carole Civil, M.D.DATE OF BIRTH:  1950-10-18   DATE OF PROCEDURE:  DATE OF DISCHARGE:                                 PROGRESS NOTE   He is hospital day #2, admitted yesterday, with a superficial wound  infection of the right knee status post right total knee arthroplasty,  approximately 2 weeks ago.  He was doing well, has no significant pain  in his right knee.  His incision today shows at the superior portion,  there is redness around the areas where the staples were removed.  There  is no joint effusion.  His passive range of motion is 0-85 without pain.   He is on IV vancomycin.  His initial C-reactive proteins 8.2, CBC is  6.4, white count with 62%, neutrophils with a sed rate of 129.  We will  follow that as our course continues here.  It does not appear he has a  deep infection.  We will resume his preadmission medications today.      Carole Civil, M.D.  Electronically Signed     SEH/MEDQ  D:  10/03/2008  T:  10/03/2008  Job:  [id number]

## 2011-01-13 NOTE — Discharge Summary (Signed)
NAME:  JEANETTE, DIFRANCO                 ACCOUNT NO.:  [id number]   MEDICAL RECORD NO.:  [id number]          PATIENT TYPE:  INP   LOCATION:  IC09                          FACILITY:  [facility]   PHYSICIAN:  Bonnielee Haff, MD     DATE OF BIRTH:  Jul 27, 1951   DATE OF ADMISSION:  03/13/2007  DATE OF DISCHARGE:  07/14/2008LH                               DISCHARGE SUMMARY   TRANSFER DIAGNOSES:  1. Lower gastrointestinal bleed, likely diverticular, with significant      hypotension, requiring multiple pressors.  2. Patient requiring bleeding scan and/or an arteriogram to elucidate      the site of bleeding.  3. History of hypertension.  4. History of alcohol abuse.  5. History of gastrointestinal bleeds in the past.   HISTORY:  Please see H&P dictated by Dr. Rolena Infante for details regarding  the patient's present illness.   BRIEF HOSPITAL COURSE:  Briefly, this is a 60 year old African American  male who has a history of hypertension and alcohol abuse, who has had  problems with his left knee including arthritis.  He is status post knee  replacement and developed some abscesses after surgery.  In any case,  the patient presented to the ED yesterday with complaints of bright red  blood per rectum.  The patient's hemoglobin actually was pretty good at  15.6, but the patient was admitted to the hospital for observation and  to be seen by GI.  While he was in the ED, his blood pressure started  falling.  He started having more bleeding per rectum.  He had a few  episodes of emesis as well, but this was only bilious; no blood was seen  in the vomitus.  The patient was subsequently moved into the intensive  care unit.  Throughout the night, we have been dealing with his low  blood pressures, which have been running systolic [id number] and diastolics  [id number].  His MAP has been running about 40-50.  His heart rate is ranging  from 100-160; mostly, it is sinus tachycardia with a few PVCs.  He also  was mildly  hypokalemia and he was also in acute renal failure with an  elevated creatinine and normal BUN.  The patient required initiation of  Levophed and then vasopressin also has been started on this individual.  We have transfused him about 3 units of blood.  A repeat hemoglobin was  12.2; this was before he was transfused any blood, but the patient has  lost about 1500 mL of blood while he was in the ICU.  He is also being  bolused aggressively with fluids.   I have discussed this case with Dr. Gala Romney, a gastroenterologist, who  feels that because the patient is unstable, he will not be able to do  endoscopy and anyway, he suspects more of a lower GI bleed rather than  upper.  Because of his history of alcohol abuse, he recommended that the  patient be started on octreotide, as there is a remote possibility that  he may have variceal bleeding.  The patient also has thrombocytopenia,  most likely from his alcohol abuse.  Dr. Gala Romney also recommended that I  speak with Dr. Arnoldo Morale, General Surgeon, to see if he has anything to  offer, as the bleeding was persisting.  Dr. Arnoldo Morale told me that he  would prefer to have a bleeding scan or an arteriogram before he  proceeded to take this patient to surgery.  The patient is obviously  unstable at this time and in any case, an arteriogram cannot be done in  this hospital.  A bleeding scan can be done; however, I am not sure if  it can be done on an emergent basis, especially since today is Sunday.  In view of all of the above, I discussed this case with Dr. Titus Mould  from the [facility].  He agreed that the patient probably needed to be  transferred to [facility]; hence, the plan is for the patient to be  transferred today to [facility].   In preparation for the transfer and to stabilize the patient a little  bit more, I did place a right femoral vein central venous catheter.  An  arterial line was also placed.  RT was having some trouble placing the   arterial line; hence, I placed the line in the right radial artery; 2-3  attempts were made on the left radial artery without success by the  respiratory therapist.  A small hematoma did develop on the left wrist.   The patient is currently on PPI b.i.d. IV.  He is also on octreotide  infusion per GI recommendations.  He has been having a few episodes of  emesis for which he has been getting some Phenergan.  I also ordered an  abdominal x-ray; however, that could not be done so far because of  multiple interventions that were being done on this patient till now.  I  also ordered thiamine for this individual.  He continues to be on  Levophed and vasopressin.  With dopamine, his heart rate tended to go  up.   He is being transfused 3 units of blood.  He also received 1 superpack  of platelets.  His LFTs were checked, which showed that his bilirubin  was 1.6.  AST was 53, ALT was normal, albumin was 3.4.   All of his antihypertensive agents obviously have been held.   We would like to thank Dr. Titus Mould for helping [place] manage this patient  over the [facility] and helping in coordinating transfer of care to [place].  The patient will most likely need emergent  bleeding scan or arteriogram.  It should be noted, however, that he does  have an elevated creatinine and any contrast may further complicate this  issue.  In any case, if he continues to active bleed, we may have to  bite the bullet and go ahead and do the test in any case.  The patient  will likely need to be seen by Gastroenterology as well as more likely  by a surgery team at [facility]; however, I will defer all of the  above issues to the intensivists at [place]:  About 1 hour.   COMMENT:  None      Bonnielee Haff, MD  Electronically Signed     GK/MEDQ  D:  03/14/2007  T:  03/14/2007  Job:  [id number]   cc:   R. Garfield Cornea, M.D.  [address]  [place]  [place]  [address]   Dellis Filbert  Flo Shanks, M.D.  Fax: [phone]   Carole Civil, M.D.  Fax: [place], [place]  [place] [address]

## 2011-01-13 NOTE — H&P (Signed)
NAME:  [name], MATKIN NO.:  [id number]   MEDICAL RECORD NO.:  [id number]          PATIENT TYPE:  INP   LOCATION:  IC03                          FACILITY:  [facility]   PHYSICIAN:  Anselmo Pickler, DO    DATE OF BIRTH:  [birth year]   DATE OF ADMISSION:  08/25/2007  DATE OF DISCHARGE:  LH                              HISTORY & PHYSICAL   CHIEF COMPLAINT:  Weakness.   HISTORY OF PRESENT ILLNESS:  The patient is a 60 year old African  American male who presented to [facility] with a chief  complaint of weakness x2 days.  The patient stated that he had symptoms  of vomiting and he claimed that he was dehydrated for several days and  stated that the weakness started around that time.  He stated also he  was having abdominal pain as well.  The patient was recently seen by Dr.  Aline Brochure last week for debridement of surgical site for knee  replacement.  He has been put on antibiotics and has not had any  symptoms since then.  While the patient was in the emergency room, he  was extremely hypotensive with readings as low as 73/46.  He was given 2  liters while he was down there.  Also, he was started on a dopamine drip  while he was in the ED to bring his systolic pressure up to [id number].  While  he was on the drip at 7.5 mcg the patient became diaphoretic and had one  episode of emesis and his rhythm went into bigemini at that point in  time.  The dopamine drip was stopped.  The patient was then given a 500  mL bolus of fluid, normal saline, and then placed in the ICU for  monitoring.   PAST MEDICAL HISTORY:  Is from the patient who is a poor historian.  His  medical history is significant for:  1. Hypertension.  2. GERD.  3. Gout.  4. Ulcer.  5. Arthritis.   PAST SURGICAL HISTORY:  1. He has had knee replacement.  2. Right hand surgery due to fracture.   The patient denies any tobacco use or drug use but admits to being a  heavy former drinker.  Currently, the  patient is noted to have the smell  of ETOH as well.   ALLERGIES:  There are no known drug allergies.   The patient is on several medications that include:  1. Hydrocodone/acetaminophen as needed.  2. Metoprolol tartrate 25 mg twice a day.  3. Protonix 40 mg once a day.  4. Folic acid 2 mg once a day.  5. Amlodipine.  6. Benazepril 5/20 once a day.  7. Allopurinol 200 once a day.  8. Vitamin B one p.o. once a day.   REVIEW OF SYSTEMS:  CONSTITUTIONAL:  Positive for weakness, dizziness,  and fatigue.  EYES:  Negative for eye pain, diplopia, but positive for  redness.  ENT:  Negative for ear pain, hearing loss, hoarseness,  otorrhea, rhinorrhea.  CARDIOVASCULAR:  Negative for chest pain,  palpitations, or bradycardia.  Positive for  arrhythmia in the ED.  RESPIRATORY:  Negative for cough, dyspnea, or wheezing.  GASTROINTESTINAL:  Positive for nausea and vomiting.  Positive for  abdominal pain.  GU:  Negative for dysuria, urgency, or flank.  SKIN:  Negative for pruritus or rash.  NEUROLOGIC:  Negative for headache,  confusion, or altered mental status.   PHYSICAL EXAMINATION:  VITAL SIGNS:  Currently respirations 15, heart  rate 104, blood pressure 83/47.  He is sating at 98% on room air.  GENERAL:  This is a well developed, well nourished, African American  male who looks his stated age.  HEENT:  Head is normocephalic atraumatic.  Eyes are EOMI and PERRLA.  Positive scleral erythema.  No conjunctival discharge.  Ears:  TMs  visualized bilaterally.  Nose:  Turbinates are moist.  Mouth:  No  erythema noted.  NECK:  Supple.  No thyromegaly or carotid bruits.  No lymphadenopathy.  CARDIOVASCULAR:  Regular rate and rhythm.  No rub, murmur, or gallop.  RESPIRATIONS:  Clear to auscultation bilaterally.  No wheezes, rales, or  rhonchi.  ABDOMEN:  Positive tenderness at the right upper quadrant.  Positive  firmness past the rib edge of the right upper quadrant.  EXTREMITIES:  Left knee  positive surgical scars for left knee  replacement.  No pitting edema noted.  NEUROLOGIC:  Cranial nerves II-XII grossly intact.  Alert and oriented  x3.  SKIN:  Good turgor.  Good texture.  No petechiae noted.   TESTING:  Comprehensive metabolic panel, his sodium was low at 133,  potassium 4.7, chloride 108, carbon dioxide 16, glucose 92, BUN 42, and  creatinine 6.85.  Liver enzymes are within normal limits.  His white  count is 6.7, hemoglobin is 10.3, hematocrit is 31, platelet count is  146.   ASSESSMENT:  1. Hypotension.  2. Renal failure.  3. High blood pressure.  4. Dehydration.   PLAN:  1. We will admit the patient to the ICU due to his episode in the ED      and trying to increase his blood pressure using dopamine.  We will      monitor him for the evening and then if in the morning if he is      stable we will consider transfer out of the ICU.  2. Also, the patient is hyponatremic.  We will go ahead and replace      his potassium.  3. We will have Dr. Lowanda Foster consult and participate in the morning      regarding renal failure.  4. We will also obtain blood cultures due to his past medical history      of having an infection in the surgical site of his knee replacement      quite recently.  5. Also, we will obtain an ETOH level as well as a urine drug screen.  6. Based on the patient's physical exam, we will get this information      first, but may consider doing a CT of the abdomen.  This could      either be done inpatient or outpatient depending on his course of      management.  7. We will go ahead and do DVT and GI prophylaxis.  8. We will hold all of his medications for hypertension.  9. We will also have cardiology come and consult regarding his      abnormal rhythm as well.  10.We will continue to monitor the patient and change therapy when  necessary.  11.Will have E-link manage and spoke with physician on current      recommendations      Anselmo Pickler, DO  Electronically Signed     CB/MEDQ  D:  08/25/2007  T:  08/25/2007  Job:  [id number]

## 2011-01-13 NOTE — Discharge Summary (Signed)
NAME:  [name], [name] NO.:  [id number]   MEDICAL RECORD NO.:  [id number]          PATIENT TYPE:  INP   LOCATION:  A220                          FACILITY:  [facility]   PHYSICIAN:  Carole Civil, M.D.DATE OF BIRTH:  [birth year]   DATE OF ADMISSION:  12/30/2006  DATE OF DISCHARGE:  05/05/2008LH                               DISCHARGE SUMMARY   ADMISSION DIAGNOSIS:  Abscess left knee.   DISCHARGE DIAGNOSIS:  Abscess left knee.   PROCEDURE:  Incision and drainage of abscess left knee.   SURGEON:  Carole Civil, M.D.   DATE OF OPERATION:  Jan 01, 2007.   OPERATIVE FINDINGS:  Superficial abscess not involving his total knee  prosthesis.   HOSPITAL COURSE:  The patient was admitted on May 1, and went to surgery  on May 3.  Preoperatively on the rhythm strip in the preoperative area  he had PVC's.  12-lead EKG was done.  He was stable throughout this  course and never had any chest pain, shortness of breath, or blood  pressure drop.  We did correct his potassium prior to surgery from March  3, to March 6.  He tolerated the surgery with no problems under spinal  anesthetic and had no complications.   He was seen by cardiology on Jan 03, 2007.  Please see their consultation  report.  He is placed on Levaquin.  Cultures are pending.  He will have  wet-to-dry dressing changes at home by a nurse.   DISCHARGE MEDICATIONS:  1. K-Dur 20 b.i.d.  2. Levaquin 500 mg daily.  3. Vicodin 5 mg q.4 hours.  4. Vitamin B1 100 mg daily.  5. Folic acid 1 mg p.o. two tablets b.i.d.  6. Protonix 40 mg daily.  7. Lopressor 25 mg b.i.d.  8. Amlodipine.  9. Benazepril 5/20 daily.  10.Poly iron 150 mg p.o. b.i.d.   FOLLOWUP:  He is scheduled for a follow-up in my office on Jan 11, 2007,  for wound check. Any recommendations by cardiology will be added in a  subsequent dictation.      Carole Civil, M.D.  Electronically Signed     SEH/MEDQ  D:  01/03/2007  T:   01/03/2007  Job:  [id number]

## 2011-01-13 NOTE — H&P (Signed)
NAME:  [name], [name]                 ACCOUNT NO.:  [id number]   MEDICAL RECORD NO.:  [id number]          PATIENT TYPE:  INP   LOCATION:  IC09                          FACILITY:  [facility]   PHYSICIAN:  Merry Lofty, MD   DATE OF BIRTH:  [birth year]   DATE OF ADMISSION:  DATE OF DISCHARGE:  07/14/2008LH                              HISTORY & PHYSICAL   PMD:  Dr. Jonna Munro.   CHIEF COMPLAINT:  Blood per rectum of hour's duration.   HISTORY OF PRESENT ILLNESS:  [name] is 60--year-old male patient with  history of hypertension, alcohol abuse, and history of gouty arthritis  with left knee replacement.  He came with bleeding per rectum of hour's  duration.  He stated that he had this bleeding spontaneously without any  bowel movement since 3 p.m., and he has multiple times bleeding  __________  in the hour.  The patient stated that he had the same  episode a couple years ago as well, and he stated that he thinks that he  had colonoscopy even though he did not remember the details of it.  Otherwise, he denied any nausea or vomiting.  He did not have any  abdominal pain as well.  The bleeding is bright red blood without any  stool or darkening.   REVIEW OF SYSTEMS:  A 10 point Review of Systems is as mentioned in the  HPI; otherwise, he denied any chest complaints.  No urinary complaints.  No fever or cough.  No dizziness until now.   ALLERGIES:  No known drug allergies.   PAST MEDICAL HISTORY:  1. Hypertension on medication.  2. Alcohol abuse.  3. Chronic arthritis.   PAST SURGICAL HISTORY:  1. History of knee replacement.  2. History of cholecystectomy.   HOME MEDICATIONS:  1. Metoprolol 25 mg b.i.d.  2. Protonix 40 mg once a day.  3. Folic acid 2 mg once a day.  4. Lasix 20 mg once a day.  5. Amlodipine/benazepril 5/2 mg once a day.  6. Allopurinol 200 mg once a day.  7. Vitamin B.   PHYSICAL EXAMINATION:  GENERAL:  The patient is on the ER bed awake and  conscious, no  respiratory distress currently.  VITAL SIGNS:  Blood pressure 97/47, temperature 99, pulse rate 112 per  minute, saturation 100%.  HEENT:  He has pink conjunctivae, nonicteric sclera.  NECK:  Supple.  CHEST:  Good air entry bilaterally.  CVS:  S1, S2, heard, some irregularity and tachycardic.  ABDOMEN:  Soft.  No area of tenderness.  Normoactive bowel sounds.  EXTREMITIES:  No pedal edema.  CNS:  He is alert and well oriented.   LABORATORY DATA:  On the labs chemistry:  Sodium 134, potassium 3.1,  chloride 94, bicarb 27, glucose 102, BUN 15, creatinine 4.3.  On  hematology white blood cells 6.5, hemoglobin 13, hematocrit 40.8,  platelet count 83.  PT 12, INR 1.   ASSESSMENT:  1. Painless lower gastrointestinal bleed.  The patient started to have      bleeding hours before his presentation to ER and currently  the      hemoglobin/hematocrit is okay, but still he has continued to bleed      and pulse rate is becoming tachycardiac and blood pressure is on      the lower side.  Will admit him to ICU, will monitor him, and will      give him a bolus of normal saline.  Will prepare blood for      transfusion and will monitor CBC, hemoglobin/hematocrit very      frequently.  If he continued to bleed, will consult GI today.  If      not will consult tomorrow morning.  2. Tachycardia and hypotension secondary to bleeding.  Will manage it      with IV fluid and with blood transfusion and will manage according      to the response.  3. He has elevated creatinine.  Probably this is related to his      gastrointestinal bleed and will see after IV fluid boluses.  4.      Hypokalemia.  Will give him potassium chloride IV and will monitor      his chemistry.  Our management will depend on his response and his      bleeding whether it continues or stops.  For now we do not need to      put him on any hypertension medications as his blood pressure is on      the lower side and bleeding.  All his  management and his clinical      response will be dependent on the gastrointestinal bleed and his      stabilization.      Merry Lofty, MD  Electronically Signed     MT/MEDQ  D:  03/14/2007  T:  03/14/2007  Job:  [id number]

## 2011-01-13 NOTE — Op Note (Signed)
NAME:  [name], [name] NO.:  [id number]   MEDICAL RECORD NO.:  [id number]          PATIENT TYPE:  INP   LOCATION:  A220                          FACILITY:  [facility]   PHYSICIAN:  Carole Civil, M.D.DATE OF BIRTH:  01/24/1951   DATE OF PROCEDURE:  01/01/2007  DATE OF DISCHARGE:  01/03/2007                               OPERATIVE REPORT   PREOPERATIVE DIAGNOSIS:  Abscess, left knee.   POSTOPERATIVE DIAGNOSIS:  Abscess, left knee.   PROCEDURE:  Incision and drainage of abscess, left knee.   SURGEON:  Carole Civil, M.D.   OPERATIVE FINDINGS:  Superficial abscess not involving the total knee or  the total knee incision.   HISTORY:  The patient presented on May 1 with a swollen knee and a fever  which defervesced with Tylenol. He was taken to surgery to evaluate his  knee.  He had had a left total knee several months before.   DESCRIPTION OF PROCEDURE:  After our proper identification of the limb  and the patient and proper marking, the patient was taken to surgery and  had a spinal anesthetic.  The tourniquet was elevated. The time-out  procedure was completed.  The point of maximal tenderness was just to  the right of the midline knee incision.  A previous incision had been  done there 20 years ago, so the abscess was at the junction of the two.  The total knee incision was opened distally in the event that this did  track into the knee joint.  However, on entering the knee, we found that  the abscess was just at a superficial abscess and it was evacuated,  irrigated, cultured, and packed closed.  A sterile dressing was applied.  The knee tourniquet was released, the foot and ankle showed good  viability. The patient was taken to the recovery room after sterile  dressings were applied in good condition.      Carole Civil, M.D.  Electronically Signed     SEH/MEDQ  D:  01/07/2007  T:  01/07/2007  Job:  [id number]

## 2011-01-13 NOTE — H&P (Signed)
NAME:  SUMNER, MAILLE NO.:  [id number]   MEDICAL RECORD NO.:  [id number]          PATIENT TYPE:  AMB   LOCATION:  DAY                           [facility]ACILITY:  [facility]   PHYSICIAN:  Carole Civil, M.D.DATE OF BIRTH:  [birth year]   DATE OF ADMISSION:  DATE OF DISCHARGE:  LH                              HISTORY & PHYSICAL   CHIEF COMPLAINTS:  Drainage from left knee.   HISTORY:  This is a 60 year old male who is status post incision and  drainage of left knee wound May 2008, status post incision and drainage  of hematoma and evacuation of hemarthrosis 08/06/06,  status post total  knee replacement in October 30th of 2007, status post hardware removal  left tibial nail June 2007, status post left tibial nailing  approximately 6-8 years ago all on the left leg and knee.   He has been treated with local wound care, has re-developed a collection  of purulence at the end of his incision.  This area is at the joining of  two incisions one from his total knee and one from a medial collateral  ligament repair over 30 years ago.  There is purulent drainage, redness,  warmth and tenderness consistent with abscess.   His medicines are multivitamin, Protonix, Lasix, metoprolol, aspirin,  Lortab, Zyloprim, thiamine, Zoloft, Cipro and Vicodin.   ALLERGIES:  No allergies.   PAST MEDICAL HISTORY:  He does have a history of diverticulosis,  gastroesophageal reflux disease.  He had a heart cath 2007 with normal  ejection fraction, is also status post cholecystectomy.   FAMILY HISTORY:  His father died at 73.  His mother died at 65.  He has  five brothers with hypertension and kidney problems.   SOCIAL HISTORY:  He does not smoke.  He reports using alcohol.  No drug  use.  No smoking history.   EXAM:  Shows stable vital signs.  He is somewhat unkempt but normal  development, nutrition. Grooming and hygiene are acceptable.  Normal  head.  NECK: Supple.  No trauma or  lymphadenopathy.  CHEST: Clear.  CARDIOVASCULAR:  Normal perfusion and pulses with no edema.  GI: Shows a normal soft abdomen, nondistended, nontender.  EXTREMITIES:  The left knee has two incisions, one midline and one  medial.  At the apex of these incisions at the inner or medial side of  the tibia, there is fluid collection and purulent drainage.  This area  is warm and tender.  Range of motion in the knee is zero to 70.  He  refuses manipulation.  The remaining portion of the incisions are healed  normal, nontender.  There is no joint effusion or joint pain or  tenderness with range of motion.  NEURO shows poor coordination.  I expect it is from chronic alcoholism.  Reflexes 1+ in the knee, 2+ upper extremities, normal sensation lower  Extremities: Oriented to person, place and time.   IMPRESSION:  Is fluid collection and abscess left knee/leg.   PLAN:  Is for incision and drainage and open packing of the wound with  cultures to be obtained.      Carole Civil, M.D.  Electronically Signed     SEH/MEDQ  D:  06/23/2007  T:  06/23/2007  Job:  [id number]   cc:   [facility]

## 2011-01-13 NOTE — Discharge Summary (Signed)
NAME:  [name], [name]                 ACCOUNT NO.:  [id number]   MEDICAL RECORD NO.:  [id number]          PATIENT TYPE:  INP   LOCATION:  A312                          FACILITY:  [facility]   PHYSICIAN:  Bonnielee Haff, MD     DATE OF BIRTH:  [birth year]   DATE OF ADMISSION:  08/25/2007  DATE OF DISCHARGE:  01/06/2009LH                               DISCHARGE SUMMARY   PRIMARY MEDICAL DOCTOR:  Dr. Jonna Munro.  He is followed by Dr. Aline Brochure  for his joint issues.   DISCHARGE DIAGNOSES:  1. Possible Septic arthritis of the right knee.  2. Resolving septic arthritis of the left knee with methicillin-      resistant Staphylococcus aureus.  3. Right foot gout flare, improved.  4. Anemia of chronic disease and secondary to chronic blood loss.  5. History of hypertension, stable.  6. Lower extremity edema, improving.   Please see H&P dictated at the time of admission regarding the patient's  presenting illness.   BRIEF HOSPITAL COURSE:  Briefly, this is a 60 year old African-American  male, who presented to the hospital complaining of weakness.  The  patient was thought to be significantly hypotensive in the emergency  department and required admission into the intensive care unit.  The  patient required fluids and I think initially required pressor support  as well.  He was found to be in sepsis from infection in his right knee  joint.  The patient has a history of infection in his left knee joint  where he underwent knee surgery a few months ago.  He is followed by Dr.  Aline Brochure for the same issues.  So anyway, the patient came in.  He was  admitted to the ICU; he improved.  He was also in acute renal failure.  The patient was aggressively treated, and most of his issues have  stabilized.  So, he was found to have swelling in his right knee as well  which Dr. Aline Brochure tapped the fluid which was cloudy and showed 20,000  WBCs with 90% neutrophils.  Crystal exam was also positive for  monosodium  urate but because of this significantly high white count, it  was felt that this was more likely septic arthritis rather than just  purely gout.  The patient has a history of MRSA infection to the left  knee and has been on IV vancomycin in the past.  So, the patient was put  on IV vancomycin, but his fevers did not resolve.  He continued to have  low-grade temperatures.  Cultures from the knee did not really grow  anything from the right knee, so he was started on Fortaz and with that,  the fevers have subsided.  So, the patient will require treatment with  Fortaz and vancomycin for the next 3-4 weeks whichever is detailed  below.  Then at 3 weeks, he will need to follow with Dr. Aline Brochure to see  if he needs more treatment or not.  So, his acute renal failure has  resolved.  His knee is looking better.  He has also had a lot  of lower  extremity edema for which we are giving him Lasix, and he has improved.  On discharge, he will be transitioned to p.o. Lasix.  The rest of  medical issues are stable.  The patient is alert but slow mentally.  He  does not fully comprehend the severity of his condition at times.  Unfortunately, because of 2 antibiotics that have to be given through  the intravenous route, we will not be able to send him home.  So,  attempts are underway to have him screened to go to [facility] which is a  long term care center which has a facility to give long term  antibiotics.   He has anemia which is secondary to chronic blood loss and chronic  disease.  Hemoglobin has been stable to more than 8.  Needed transfusion  1 unit of blood.  There is no overt bleeding.  He has a history of GI  bleeding with diverticular bleeding for which he is followed by  gastroenterology.   So, bottom line is the patient needs IV antibiotics for a few weeks, and  I think the best modality would be to send him to [facility].  I do not  know if that will be a possibility or not, but we have  progressively  pursued that option.   DISCHARGE MEDICATIONS:  Anticipated at this time include:  1. Allopurinol 200 mg daily.  2. Nu-Iron 150 mg b.i.d.  3. Folic acid 2 mg daily.  4. Vicodin 5/500, 1 tab q.4-6h. p.r.n. pain.  5. Protonix 40 mg once daily.  6. Norvasc 5 mg daily.  7. Benazepril 20 mg daily.  8. Colchicine 0.6 mg p.o. b.i.d. hold for diarrhea.  9. Lasix 40 mg p.o. daily x2 weeks and then reevaluate depending on      edema.  10.Metoprolol 25 mg p.o. b.i.d.  11.Antibiotics vancomycin 1500 mg IV q.24h. x4 weeks and Fortaz 2 g IV      q.8h. x3 weeks.   Pharmacy protocols to be followed for these 2 antibiotics including  frequent as needed blood draws and vanc levels and BMETs as needed.  I  would recommend a BMET in 1 week's time to check his potassium.  He will  also need to be on potassium 20 mEq p.o. once daily.   FOLLOW UP:  1. With Dr. Aline Brochure in 3 weeks' time to decide if he needs further      duration of antibiotics.  2. With his PMD in 5-6 weeks.   Please be aware that the patient is followed by [facility], Dr. Caro Hight, and she had recommended during his  visit in August, that he may need surgical consideration if he has  further GI bleeding.   ADDENDUM:  Correction were made to this summary. The WBC in the synovial was  initially thought to be 200000 but it was actually 20000 as pointed to  me by Dr. Aline Brochure. Still only after initiation of Tressie Ellis did the  patients fevers resolve. So I still think patient needs double  antibiotic coverage for now.   I was informed later that patient could not be sent to [facility] or to  [facility]. Alternatives were being considered.      Bonnielee Haff, MD  Electronically Signed     GK/MEDQ  D:  09/05/2007  T:  09/05/2007  Job:  [id number]

## 2011-01-13 NOTE — Op Note (Signed)
NAME:  [name], [name] NO.:  [id number]   MEDICAL RECORD NO.:  [id number]          PATIENT TYPE:  AMB   LOCATION:  DAY                           FACILITY:  [facility]   PHYSICIAN:  Caro Hight, M.D.      DATE OF BIRTH:  [birth year]   DATE OF PROCEDURE:  12/08/2007  DATE OF DISCHARGE:                               OPERATIVE REPORT   REFERRING PHYSICIAN:  Weston Settle, M.D.   PROCEDURE:  Esophagogastroduodenoscopy with snare cautery and cold snare  polypectomy   INDICATION FOR EXAM:  [name] is a 60 year old male who was initially  seen for a lower GI bleed secondary to diverticulosis.  At that time, he  had normocytic anemia associated with renal insufficiency and alcohol  abuse.  He uses alcohol consumption and had been maintained on  omeprazole 20 mg daily.  He had a recheck a hemoglobin in April 2009,  which was 12.3 with an MCV of 90.8, but his ferritin a decreased from  102 to 22 since January 2009.  The EGD is being performed to evaluate  for early iron deficiency anemia.   FINDINGS:  1. Five gastric polyps seen in the cardia and body of the stomach.      Two polyps were approximately 1.5 cmand very friable. These polyps      were removed via snare cautery.  He had a 6 mm gastric polyp which      was removed via snare cautery.  He had two 4-5 mm polyps removed      via cold snare.  One polyp was not retrieved.  He had patchy      erythema with occasional erosions seen in the body and proximal      stomach.  Three of the sites were tattooed, one in the cardia and      two in the body of the stomach using SPOT.  Approximately 7 cm of      SPOT was used.  He had a small amount of active oozing after the      larger polyps were removed.  2. Normal esophagus without evidence of Barrett's, mass, erosion,      ulceration or stricture.  3. Normal duodenal bulb and second portion of the duodenum.  Normal      ampulla.  Moderate bile staining.   DIAGNOSES:  The  larger polyps are likely contributing to his normocytic  anemia and his decrease in iron stores.   RECOMMENDATIONS:  1. Will call [name] with the results of his biopsies.  All the      polyps looked mucosally based and had adenomatous changes.  2. Will repeat EGD in 3 weeks to assess for any additional polyps.  3. [name] should avoid alcohol indefinitely.  He should avoid      aspirin, NSAIDs for 30 days.  No anticoagulation for 7 days.  4. He should continue the omeprazole 20 mg daily.  5. He should have clear liquids for 12 hours, then resume his previous      diet, but avoid gastric irritants.  6. He already has a follow-up appointment to see me in August 2009.   MEDICATIONS:  1. Demerol 100 mg IV.  2. Versed 9 mg IV.   PROCEDURE TECHNIQUE:  Physical exam was performed.  Informed consent was  obtained from the patient after explaining the benefits, risks and  alternatives to the procedure.  The patient was connected to the monitor  and placed in the left lateral position.  Continuous oxygen was provided  by nasal cannula and IV medicine administered through an indwelling  cannula.  After administration of sedation, the patient's esophagus was  intubated and the scope was advanced under direct visualization to the  second portion of the duodenum.  The scope was withdrawn slowly by  carefully examining the color, texture, anatomy and integrity of the  mucosa on the way out.  Prior to withdrawing the scope on the first  time, a 1.5 cm gastric polyp was snared and removed via Jabier Mutton net.  He  required reintubation of his esophagus at least three times to  successfully remove all five polyps.  The patient was recovered in  endoscopy and discharged home in satisfactory condition.      Caro Hight, M.D.  Electronically Signed     SM/MEDQ  D:  12/08/2007  T:  12/08/2007  Job:  [id number]   cc:   Weston Settle, M.D.

## 2011-01-13 NOTE — Group Therapy Note (Signed)
NAME:  [name], [name]                 ACCOUNT NO.:  [id number]   MEDICAL RECORD NO.:  [id number]          PATIENT TYPE:  INP   LOCATION:  [id number]                          FACILITY:  [facility]   PHYSICIAN:  Bonnielee Haff, MD     DATE OF BIRTH:  [birth year]   DATE OF PROCEDURE:  09/06/2007  DATE OF DISCHARGE:                                 PROGRESS NOTE   SUBJECTIVE:  The patient is feeling better.  His pain has improved.  He  is able to ambulate.  His swelling is better.  He wants to go home  though.  I have tried to explain to him that this might be a little  difficult at this time, considering he requires two IV antibiotics via  the IV route.  Anyway, our social worker is working on it.   OBJECTIVE:  VITAL SIGNS:  Stable.  LUNGS:  Clear to auscultation.  EXTREMITIES:  His pedal edema is getting better.   LABORATORY DATA:  Did not really show anything acute at this time.  His  renal function is improved and stable.   ASSESSMENT/PLAN:  Right knee possible septic arthritis with superimposed  gout.  Even though the cultures were negative from the synovial fluid,  he remained febrile for a prolonged period and his fever only subsided  after Tressie Ellis was added.  So his cultures were reported negative because  he was already on antibiotics.  I still think that he needs three to  four weeks of IV antibiotics of both vancomycin and Fortaz.  This has  been dictated in my discharge summary dated September 05, 2007.   Dr. Carole Civil will follow him as an outpatient.  We are still  trying to determine how he is going to get this.  The patient is very  keen on going home, although this may not be a practical option.   NOTATION:  Something was just point out to me a few minutes ago.  In my  discharge summary, I dictated that his WBC count was 200,000, but  actually it was 20,000.  I was mistaken about that.  Dr. Aline Brochure  clarified that for me just a few minutes ago, but still I think  considering  his presenting picture of septic shock, his fever and his  response to [facility], I still think that the patient needs treatment for  presumed septic arthritis.   Gout is stable.  Continue his medications.   Anemia:  His hemoglobin is running about 8 to 9.  He has a chronic  history of GI bleeding, which he does not have any active issues at this  time.   Hypertension is also stable.   So we are attempting to resolve his disposition.  He continues to be  medically stable.  He will need the IV antibiotics as stated in my  discharge summary.  But apart from that, he is doing pretty well, so  depending upon what Social Work is able to arrange, he may be able to go  later today, or maybe tomorrow.      Gokul  Maryland Pink, MD  Electronically Signed     GK/MEDQ  D:  09/06/2007  T:  09/06/2007  Job:  [id number]

## 2011-01-16 NOTE — H&P (Signed)
NAME:  [name], [name] NO.:  [id number]   MEDICAL RECORD NO.:  [id number]          PATIENT TYPE:  INP   LOCATION:  [id number]                          FACILITY:  [facility]   PHYSICIAN:  Carole Civil, M.D.DATE OF BIRTH:  12/13/1950   DATE OF ADMISSION:  06/16/2004  DATE OF DISCHARGE:  LH                                HISTORY & PHYSICAL   CHIEF COMPLAINT:  Pain, left leg.   He is a 60 year old male with a history of pancreatitis, ethanol abuse, B12  deficiency anemia, hypertension, gastroesophageal reflux, Trichomonas  infection recently admitted April 28, 2004 for pancreatitis.  He has other  history of degenerative joint disease left knee status post open  meniscectomy, history of alcoholic gastritis, chronic ethanol abuse, history  of rhabdomyolysis.  In 1998 he had a cholecystectomy and he also had injury  to his MCL and a meniscectomy by history.   MEDICATIONS:  1.  Metoprolol 25 mg b.i.d.  2.  Multivitamin.  3.  Protonix 40 mg daily.  4.  Potassium 20 mEq daily.  5.  Thiamin 100 mg daily.   ALLERGIES:  He has no known drug allergies.   PHYSICAL EXAMINATION:  VITAL SIGNS:  Temperature 98.5, pulse 90, respiratory  rate 20, blood pressure 147/94.  Pain level was 10 on admission.  GENERAL:  The patient is somewhat unkempt, but normal development, fair  grooming and hygiene.  Overall, nutritional status appears relatively  healthy.  CARDIOVASCULAR:  Benign including normal pulse and perfusion of the left  lower extremity.  No peripheral edema was noted.  LYMPH NODE:  Cervical region was normal.  MUSCULOSKELETAL:  Upper extremity alignment, range of motion, strength,  stability normal.  Right lower extremity the same.  Left lower extremity was  in a posterior splint with mild angulation apex anterior.  SKIN:  Warm, dry, and intact.  Large incision over the medial aspect of the  left knee.  Must be taken into account when planning surgical incision.  NEUROLOGIC:  There was normal mental status.  He was awake and alert.  He  was oriented to person and place.  He had normal sensation.  Obtainable  reflexes were normal.   Radiographs show a tib-fib fracture approximately mid shaft with angulation  on the lateral view, normal alignment on the [facility] view.   RECOMMENDATIONS:  Intramedullary nailing left tibia.   The patient has been informed of the proposed procedure.  Agrees to go ahead  with the procedure.  Appears to understand the procedure and postoperative  complications that are possible as well as the postoperative rehabilitation  course.   The instruments will have to be ordered in and we would also like the  patient's alcohol level to go down from the 200 level that it is at now  which should only take 24 hours.   Surgery will be planned as soon as instruments are available.     Cherlynn Kaiser   SEH/MEDQ  D:  06/17/2004  T:  06/17/2004  Job:  [id number]

## 2011-01-16 NOTE — Group Therapy Note (Signed)
NAME:  [name], [name] NO.:  [id number]   MEDICAL RECORD NO.:  [id number]          PATIENT TYPE:  INP   LOCATION:  [id number]                          FACILITY:  [facility]   PHYSICIAN:  Carole Civil, M.D.DATE OF BIRTH:  Sep 28, 1950   DATE OF PROCEDURE:  06/30/2006  DATE OF DISCHARGE:                                   PROGRESS NOTE   This is a follow-up note relating to the patient's radiographs of his tib-  fib and follow up to his post-op film which showed that his tibial tray was  in excessive varus position.   Today's radiograph dated June 30, 2006, indicates that the overall  prosthetic alignment is actually in neutral position to slight varus and it  is not as severe as previously stated with the tibial plafond level.  His  overall alignment appears to be acceptable.  Again noted is the valgus  alignment of the overall tibial fracture.  We also have noted the improved  radiographic appearance of the joint line when the entire limb is taken into  consideration.  At this point, no further treatment is warranted other than  serial x-ray and regular postoperative course.      Carole Civil, M.D.  Electronically Signed     SEH/MEDQ  D:  06/30/2006  T:  06/30/2006  Job:  [id number]

## 2011-01-16 NOTE — Discharge Summary (Signed)
NAME:  [name], [name]                 ACCOUNT NO.:  [id number]   MEDICAL RECORD NO.:  [id number]          PATIENT TYPE:  INP   LOCATION:  [id number]                          FACILITY:  [facility]   PHYSICIAN:  Bonnielee Haff, MD     DATE OF BIRTH:  1951/01/02   DATE OF ADMISSION:  03/09/2006  DATE OF DISCHARGE:  07/12/2007LH                                 DISCHARGE SUMMARY   DISCHARGE DIAGNOSES:  1.  Acute gastroenteritis, improved.  2.  History of hypertension, stable.   HISTORY OF PRESENT ILLNESS:  Please review H&P dictated at the time of  admission for details regarding the patient's presenting illness.   HOSPITAL COURSE:  This is a 60 year old, African-American male who presented  with nausea, vomiting and diarrhea.  He was found to be dehydrated.  Most  likely was thought to be acute gastroenteritis based on his presentation,  however, he was put on Zosyn because of bands in his peripheral smear.  The  patient was aggressively hydrated and given antiemetics.  The patient did  well over the course of the next 2 days.  He was able to tolerate p.o.  intake quite well.  He did not have any kind of abdominal discomfort  whatsoever.  He was having some diarrhea which was also decreasing frequency  and improving in consistency.  His white count remained normal the following  day.  His hemoglobin also remained stable.  His electrolytes corrected on  the day of discharge.  He was feeling quite well.  Blood cultures were all  negative and all other cultures were negative.  Considering the above, he  was considered stable for discharge.  He was asked to hold his Lasix x1 week  at which time he may resume it.  He was asked to maintain an adequate p.o.  hydration as well.   DISCHARGE MEDICATIONS:  1.  Imodium p.r.n.  2.  Protonix 40 mg p.o. daily.  3.  Resume other medications, except for the Lasix, as discussed above.   FOLLOW UP:  Follow up with primary doctor as needed.   DIET:  Bland diet x1  week.   ACTIVITY:  No restrictions.   LABORATORY DATA AND X-RAY FINDINGS:  The patient had an abdominal x-ray  which showed no acute cardiopulmonary findings, no evidence of obstruction  or perforation noted.      Bonnielee Haff, MD  Electronically Signed     GK/MEDQ  D:  03/11/2006  T:  03/11/2006  Job:  [id number]

## 2011-01-16 NOTE — Discharge Summary (Signed)
[facility]  Patient:    [name], [name] Visit Number: [id number] MRN: [id number]          Service Type: MED Location: 2A A211 01 Attending Physician:  Rexene Alberts Dictated by:   Rexene Alberts, M.D. Admit Date:  03/05/2002 Discharge Date: 03/08/2002                             Discharge Summary  DISCHARGE DIAGNOSES: 1. Hematochezia. 2. Diffuse diverticulosis, superficial patchy distal sigmoid colitis, and mild    proctitis per colonoscopy March 07, 2002.  No active source of bleeding at    the time of colonoscopy. 3. Mild diffuse superficial gastritis, status post, multiple biopsies taken.    No active bleeding during the EGD on March 07, 2002. 4. Macrocytic anemia thought to be secondary to the hematochezia and chronic    alcohol abuse. 5. Chronic thrombocytopenia thought to be secondary to chronic alcohol use. 6. Hypokalemia. 7. Hypertension. 8. Chronic alcohol use/abuse.  DISCHARGE MEDICATIONS: 1. Aciphex 20 mg q. day. 2. Potassium chloride 20 mEq take 2 twice daily for 1 week. 3. Hemocyte Plus 1 daily. 4. Clonidine 0.1 mg q. day.  CONSULTATION:  Irving Shows, M.D.  PROCEDURES PERFORMED: 1. Colonoscopy. 2. Esophagogastroduodenoscopy.  HISTORY OF PRESENT ILLNESS:  The patient is a 60 year old African-American male who presented to the emergency department on the morning of March 05, 2002, with a 3 day history of bloody stools.  The patient had associated lower abdominal crampiness.  He had 1 episode of nausea and vomiting, however, he denied throwing up blood or coffee ground emesis.  The patient admitted drinking approximately 1/5th of gin 2 to 3 days prior to the onset of the rectal bleeding.  The patient denied any recent or frequent use of Motrin, Ibuprofen, B.C. Powders, Goody Powders or any other NSAIDs.  The patient was therefore admitted and evaluated for rectal bleeding given his history of rectal bleeding and alcohol abuse.  For  additional details please see the admitting history and physical.  HOSPITAL COURSE: #1 - RECTAL BLEEDING/HEMATOCHEZIA:  The initial management started in the emergency department when the patient was given a bolus of normal saline.  The patients initial hemoglobin and hematocrit were 14.2, and 41.8 respectively with an MCV of 100.2.  Management was continued when the patient was admitted to a telemetry bed.  IV fluids were continued at 150 cc/hr. with 10 mEq of potassium chloride added.  Two units of packed red blood cells were typed and screened.  The patients hemoglobin and hematocrit were monitored daily.  Dr. Tamala Julian was consulted for a diagnostic colonoscopy and EGD.  The patient was initially placed on a clear liquid diet and started on protonix IV 40 mg q. day and multivitamin with iron and folate each day.  Phenergan was also administered as needed, for nausea, however, the patient had no nausea or vomiting during the hospitalization.  Ativan was ordered 1 mg q.4-6h. as needed for possible withdrawal symptoms.  The following day the patients hemoglobin fell to 11.4, and the hematocrit had fallen to 33.1.  During the entire hospital course the patients hemoglobin fell to a nadir of 10.9, and the hematocrit fell to a nadir of 31.4.  The EGD and colonoscopy as performed by Dr. Tamala Julian, revealed mild diffuse superficial gastritis, status post biopsies taken and diffuse diverticulosis, superficial patchy distal sigmoid colitis, and mild proctitis. The source of the bleeding was most likely  the diverticulosis, however, there was no active bleeding during the examination.  The patients diet was progressed from a clear liquid diet to a 4 gram sodium diet.  The patient had several bowel movements after the colonoscopy which according to the patient were not bloody.  The patient experienced no abdominal pain, nausea and vomiting after solid foods were restarted.  His hemoglobin and hematocrit  improved a little to a 11.4, and 33.1 prior to hospital discharge.  The patient was admonished to avoid alcohol.  The patient will be sent home on aciphex 20 mg q. day and hemocyte Plus 1 q. day.  #2 - THROMBOCYTOPENIA:  The patients platelet count ranged from 85 to 168 during the hospital course.  The source of the thrombocytopenia is unclear, however, it is probably secondary to the patients chronic alcohol abuse.  As stated above the patient was admonished to avoid alcohol.  His platelet count will need to be reassessed when the patient is not drinking alcohol heavily.  #3 - HYPOKALEMIA:  The patients initial potassium was 3.5, however, it fell to a nadir of 2.9, during the hospital course.  The patient was therefore repleted with potassium chloride in his IV fluids and potassium chloride by mouth.  Prior to hospital discharge the patients potassium was 3.3.  He will be sent home on potassium chloride 40 mEq b.i.d. x7 days.  #4 - HYPERTENSION:  The patients initial blood pressure was quite elevated in the emergency department.  However, once he was transferred to the floor his blood pressures were somewhat within normal limits.  Because of his history of alcohol abuse, he was treated with clonidine 0.1 mg t.i.d. then titrated down to 0.1 q.d.  His blood pressures systolically ranged from [id number] to 110, during the last 24-hours of hospitalization.  #5 - ALCOHOL ABUSE:  The patient was admonished to avoid alcohol use.  I referred him to the Health Department/Mental Health Department for assistance in stopping drinking if he desired.  The patient had no desire to seek help for his drinking and he felt that he could stop on his own.  The patient demonstrated no signs of alcohol withdrawal during the hospitalization. Dictated by:   Rexene Alberts, M.D. Attending Physician:  Rexene Alberts DD:  03/08/02 TD:  03/12/02 Job: [id number]

## 2011-01-16 NOTE — Discharge Summary (Signed)
NAME:  [name], [name] NO.:  [id number]   MEDICAL RECORD NO.:  [id number]          PATIENT TYPE:  INP   LOCATION:  [id number]                          FACILITY:  [facility]   PHYSICIAN:  Carole Civil, M.D.DATE OF BIRTH:  [birth year]   DATE OF ADMISSION:  08/04/2006  DATE OF DISCHARGE:  12/12/2007LH                               DISCHARGE SUMMARY   ADMITTING DIAGNOSIS:  Septic left knee.   DISCHARGE DIAGNOSIS:  Hematoma left knee.   HISTORY:  This is a 60 year old male.  He is status post intramedullary  nail left tibia, eventually that was removed.  He eventually went on to  have a left total knee.  He was seen in the office with swelling of the  knee, had no fevers or warmth to the knee, thought to have maybe a  hematoma.  He went to the emergency room a day or two later with fever,  tachycardia, knee pain.  He was given Tylenol for the fever but his  tachycardia persisted.  When I saw him in the office I tried to aspirate  the knee and did not get any fluid.  Unfortunately, his staples were not  removed by the home health nurse and when he came in the wound and skin  had overgrown the staples and the staple line was definitely inflamed.  Those were removed at the time and he was thought to have a superficial  infection at that time.   He was therefore admitted and on admission had a temperature of 97,  pulse was 79, respiratory rate was 18, blood pressure was 120/45.  He  had the following laboratory studies:  White count 6, hemoglobin 11,  neutrophil count 69%, sed rate 94, INR was 1.2, PT was 15.8.  Cardiac  markers were negative.  At that time we decided to have Cardiology  evaluate him and clear him, replace his potassium, add a C. reactive  protein to his laboratory work and start vancomycin which was done.   He then underwent exploration of the left knee with incision and  drainage, evacuation of what was a hematoma.  There was no purulence.   His culture  results were negative x6 days.  However, because he had a  prosthesis in place we went ahead and put him on IV vancomycin.  The  patient progressed well except for bending the knee and was eventually  discharged on the 12th with the following medications:  1. Folic acid 2 mg daily.  2. Nu-Iron 150 mg twice a day.  3. Metoprolol 25 mg twice a day.  4. Protonix 40 mg a day.  5. Thiamine 100 mg a day.  6. Tylox one q.4.  7. He was to have vancomycin IV 1 g q.24 hours for 24 days, with a      stop date of January 4th.   He was to have BUN, creatinine, vancomycin trough, sed rate and C.  Reactive protein levels drawn.  He was also to come in the office to  have his staples removed by me at the office and PT  at home with passive  range of motion to the left knee, he is weight bearing as tolerated.      Carole Civil, M.D.  Electronically Signed     SEH/MEDQ  D:  09/05/2006  T:  09/05/2006  Job:  [id number]

## 2011-01-16 NOTE — H&P (Signed)
NAME:  [name], [name]                           ACCOUNT NO.:  [id number]   MEDICAL RECORD NO.:  [id number]                   PATIENT TYPE:  INP   LOCATION:  A312                                 FACILITY:  [facility]   PHYSICIAN:  Unk Lightning, MD        DATE OF BIRTH:  02/13/1951   DATE OF ADMISSION:  04/26/2004  DATE OF DISCHARGE:                                HISTORY & PHYSICAL   HISTORY OF PRESENT ILLNESS:  The patient is a 60 year old black male with  chronic ethanol usage. A patient of Dr. Margie Billet, who comes in with a  12 hour history of epigastric and right upper quadrant epigastric  discomfort. He specifically denies any associated nausea, vomiting,  hematemesis, hematochezia or melena. The patient denies any ethanol  ingestion over the previous 5 days but did drink 1/2 a pint of gin 5 days  prior to admission. He appears somewhat anxious and tremulous and has an  elevated amylase of 190 with hemoglobin of 9.6. He is admitted for acute  pancreatitis, possible ethanol ingestion or withdrawal symptoms as well as  anemia.   PAST MEDICAL HISTORY:  Significant for hypertension, degenerative joint  disease of left knee, alcohol gastritis, chronic ethanol usage treated as a  teen, Rhabdomyolysis in 1998, cholecystitis and an episode of hematochezia.   PAST SURGICAL HISTORY:  Remarkable for arthroscopic surgery of the left knee  for MCL and medial meniscus issues. A laparoscopic cholecystectomy in 2003.   CURRENT MEDICATIONS:  A beta blocker. Old record reveals Inderal 10 mg  t.i.d., Clonidine 0.1 q.d. and Aciphex 20 q.d.   ALLERGIES:  No known drug allergies.   PHYSICAL EXAMINATION:  VITAL SIGNS:  Blood pressure 138/88, temperature  99.3, pulse 91 and regular, respiratory rate 20. O2 saturation is 98%.  HEENT:  Normocephalic, atraumatic. Eyes:  Pupils are equal, round, and  reactive to light and accommodation. Extraocular muscles intact. Sclerae  pink. Conjunctivae  pink.  NECK:  No jugular venous distention. No carotid bruits. No thyromegaly or  thyroid bruits.  LUNGS:  No wheezes, rales, or rhonchi are appreciable.  HEART:  Regular rhythm. S1 and S2 of normal intensity. No S3, S4, or  gallops. No murmurs. No heaves, thrills, or rubs.  ABDOMEN:  Soft. Bowel sounds are normal active. There is positive right  upper quadrant tenderness. There is no guarding, no rebound, no masses, no  organomegaly.  RECTAL:  Heme negative in the emergency room.  NEUROLOGIC:  The patient follows all commands. Appears anxious. There is no  asterixis noted. Sensory motor 5 out of 5 all extremities. Plantar's are  downgoing and his left knee appears swollen without rubor.   IMPRESSION:  1. Acute pancreatitis.  2. Presumed ETOH abuse or withdrawal.  3. Anemia with hemoglobin of 9.6.  4. Left knee degenerative joint disease.   PLAN:  Admit for intravenous fluids, thiamine injections, Ativan  prophylactically. Serial BMP, amylase, lipase and CBC's  to guard against  anemia. Will get liver profile and calcium now, as there is none on the  chart. I will make further recommendations after data base expands.     ___________________________________________                                         Unk Lightning, MD   RMD/MEDQ  D:  04/26/2004  T:  04/26/2004  Job:  [id number]

## 2011-01-16 NOTE — H&P (Signed)
NAME:  [name], [name] NO.:  [id number]   MEDICAL RECORD NO.:  [id number]          PATIENT TYPE:  AMB   LOCATION:  DAY                           FACILITY:  [facility]   PHYSICIAN:  Carole Civil, M.D.DATE OF BIRTH:  1951/08/13   DATE OF ADMISSION:  DATE OF DISCHARGE:  LH                                HISTORY & PHYSICAL   Pain left knee.   HISTORY:  This is a 59 year old male status post removal of intramedullary  nail left tibia, which was placed for left closed tibia fracture  October2006.  His recent surgery was done June 2007.  He recovered from  that, all wounds healed.  He has severe osteoarthritis of the left knee with  progressive pain, he has failed conservative therapy.  He presents for total  knee replacement.  He was cleared by cardiology for surgery.   I have explained the risks and benefits of the surgery to the patient  including nonoperative treatment with further progression of arthritis  deformity as well as increased pain.  He has opted for the surgical  treatment.  We have completed informed consent process.   The patient had a past history of pancreatitis, ethanol abuse and B12  deficiency with anemia.  He also has had hypertension, gastroesophageal  reflux.  He had a previous medial collateral ligament repair with staple  fixation in the 1970s of the left knee.  Staples are still in place.  He has  a history of previous rhabdomyolysis, cholecystectomy and meniscectomy.   MEDICATIONS:  Metoprolol, multivitamin, Protonix, thiamine and potassium.  He is noted to have no known allergies.   PHYSICAL EXAMINATION:  GENERAL APPEARANCE:  He is somewhat unkempt but  normal development, nutrition, grooming and hygiene.  No gross deformity and  he is fully developed.  HEAD AND NECK:  Is supple.  No trauma or lymphadenopathy.  CHEST:  Clear.  CARDIOVASCULAR:  Normal peripheral pulses and no swelling.  No peripheral  edema was noted.  No  tenderness.  VITAL SIGNS:  Stable.  GI:  Exam shows soft and nontender abdomen.  Heart rate and rhythm are  normal musculoskeletal and skin examination show gait and station, requiring  a cane and he has a limp.  He has a valgus knee.  Inspection the left knee  shows tenderness over the medial compartment with malalignment, crepitus on  range of motion, but he has maintained 125 degrees flexion.  He has a small  flexion contracture.  His knee is stable, including the medial collateral  ligament.  He has normal strength and muscle tone.   His skin incisions are healed.  There are two incisions, one medially for  the old collateral ligament repair and one over the patellar tendon for the  insertion of the nail and there are two small stab wounds for distal screw  insertion.  All these are healed as stated.  We will use the patella  incision for surgery.   NEURO:  Coordination test poor for him.  I suspect it is from chronic  alcohol abuse.  His reflexes  are 1+ the knee, 2+ in the upper extremities.  He has normal sensation the left lower extremity.  He is oriented to time,  person, place, mood is pleasant.   Radiographs show osteoarthritis left knee with valgus.   DX: OA LEFT KNEE   PLAN:  Is for left total knee replacement.      Carole Civil, M.D.  Electronically Signed     SEH/MEDQ  D:  06/28/2006  T:  06/28/2006  Job:  [id number]

## 2011-01-16 NOTE — H&P (Signed)
NAME:  [name], [name]                           ACCOUNT NO.:  [id number]   MEDICAL RECORD NO.:  [id number]                   PATIENT TYPE:  INP   LOCATION:  IC06                                 FACILITY:  [facility]   PHYSICIAN:  Barrie Folk. Berdine Addison, M.D.                DATE OF BIRTH:  11-21-1950   DATE OF ADMISSION:  11/10/2003  DATE OF DISCHARGE:                                HISTORY & PHYSICAL   The patient is a 60 year old, single, unemployed, black male from  [name], [place].   CHIEF COMPLAINT:  Painful, swollen left knee.   The patient has been experiencing chronic pain and swelling of the left  knee.  The patient indicated that the pain of the left knee was severe  secondary to attempted weightbearing.  He described the pain as a burning,  stabbing sensation.  History is also positive for warmth of the left knee.  The patient has been treated pertaining to chronic left knee swelling and  pain by orthopedics, Dr. Luna Glasgow, over the past two months.  The patient  denies recent fall, redness, or discoloration of the left knee.  History is  also negative for fever or chills.  Surgical history is positive for  arthroscopic surgery of the left knee secondary to torn medial collateral  ligaments, median meniscus, and anterior cruciate in September of 1986.  ER  physicians performed aspiration of the left knee during this admission.  Fluid was described as many wbc's, no organism, yellow color, appearance  hazy, white count 246 (normal 0-200), neutrophils 94 (normal 0-25), crystals  normal, glucose 30 mg/dl, and total protein 4.8 g/dl.  The patient also had  culture done of fluid, as well as blood cultures.  The patient was also  given Rocephin 1 g IV in the emergency room.   MEDICAL HISTORY:  Positive for hypertension, osteoarthritis, and chronic  ethanol use.  Negative for diabetes, tuberculosis, cancer, sickle cell,  asthma, and seizure disorder.   PRESCRIBED MEDICATIONS:  1.  Inderal 10 mg p.o. t.i.d.  2. Clonidine 0.1 mg p.o. every day.  3. Aciphex 20 mg p.o. daily.  4. Vicoprofen one tablet p.o. b.i.d.   ALLERGIES:  The patient is not allergic to any known medication.   HABITS:  Negative for tobacco and street drugs.   SEXUALLY TRANSMITTED DISEASE HISTORY:  Positive for treated gonorrhea  infection as a teenaged.  Negative for syphilis, herpes, and HIV infection.   PAST MEDICAL HISTORY:  Positive for:  1. Hospitalization for rectal bleeding March of 1998.  2. Alcohol intoxication with alcoholic hepatitis in [id number].  3. Alcoholic gastritis and alcoholic hepatitis in May of 1994.  4. Acute rhabdomyolysis in July of 1998.  5. Dizziness secondary to orthostatic change due to multifactorial origin     (chronic ethanol use, chronic renal insufficiency, urinary tract     infection).  6. In August of 1999, hospitalization  for hematochezia.  7. In July of 2003, hospitalization for chronic cholecystitis treated with     laparoscopic cholecystectomy in October of 2003.   FAMILY HISTORY:  Mother deceased in her 29s secondary to complications of  stroke.  Father deceased, cause unknown.  Three brothers, one deceased  secondary to complications of sepsis and end-stage renal disease.  Two  brothers living, one with history of diabetes and the other with health is  unknown.  Two sisters living, one with a history of diabetes mellitus and  the other with health unknown.  One sister deceased, cause unknown.  The  patient has no children.   REVIEW OF SYSTEMS:  Positive for chronic left knee pain, nausea and vomiting  within the past 24 hours (the vomiting is described as clear), epigastric  pain, dry cough, and runny nose within the past four days.  Negative for  epistaxis, bleeding gums, hematemesis, dysuria, gross hematuria, melena,  diarrhea, edema of the legs, syncope, dizziness, ecchymotic lesions, sore  throat, unexplained fever, shortness of breath, wheezing,  etc.  The patient  also denies weight loss.   PHYSICAL EXAMINATION:  GENERAL APPEARANCE:  A middle-aged, tall, medium-  frame, alert, oriented, black male in no apparent respiratory distress.  HEENT:  Head:  Normocephalic.  Ears:  Normal auricles.  External canals  positive for some cerumen.  Eyes:  Negative ptosis.  Sclerae white.  Pupils  round, equal, and reactive to light.  Extraocular movements intact.  Nose:  Negative discharge.  Mouth:  Dentition fair.  Positive missing teeth.  No  bleeding gums.  Posterior pharynx benign.  NECK:  Negative for lymphadenopathy or thyromegaly.  LUNGS:  Clear.  BACK:  No CVA tenderness.  HEART:  Audible S1 and S2 without murmur.  Regular rate and rhythm.  ABDOMEN:  Slightly obese.  Hypoactive bowel sounds.  Positive midepigastric  tenderness and mild mid infraumbilical hypogastric tenderness.  Soft.  No  palpable mass.  No organomegaly.  GENITALIA:  Penis uncircumcised.  No penial lesion or discharge.  Scrotum  positive for atrophic testicles.  Nontender.  No nodules on palpation.  RECTAL:  No external lesions.  Digital exam shows prostate mildly enlarged  and smooth.  No rectal vault masses.  Stools slightly positive.  EXTREMITIES:  Left knee positive for significant swelling.  Positive for old  healed medial surgical scar.  Positive for tenderness with slight flexion of  the left knee.  Positive for warmth of the left knee.  No discoloration of  left knee.  Right knee with no swelling.  Positive crepitus with flexion and  extension.  Positive for stiffness of right knee.  No tibial edema.  Palpable dorsalis pedis bilaterally.  NEUROLOGIC:  Alert and oriented to person, place, and time.  Cranial nerves  II-XII appeared intact.   LABORATORY DATA:  Sodium 135, potassium 3.8, chloride 103, CO2 23, glucose  107, BUN 15, creatinine 1.3, calcium 9.1.  White count 12.9, hemoglobin 12.5, hematocrit 36.6, platelets 232,000.  Urinalysis with specific  gravity  1.025, pH 5.5, no glucose, no bilirubin, no ketones, nitrite positive,  moderate leukocytes, white count 11-20, rbc's 3-6, and bacteria few.   IMPRESSION:  1. Symptomatic significant left knee effusion.  2. Acute urinary tract infection.   SECONDARY DIAGNOSES:  1. Hypertension.  2. Osteoarthritis.  3. Chronic ethanol use.   PLAN:  1. IV fluids and IV antibiotics using Ancef 1 g IV q.8h.  2. __________ 500 mg p.o. daily.  3. Blood culture.  4. Culture of fluid of left knee.  5. Urine culture.  6. One amp of multivitamins and 1 L of fluids every 24 hours.  7. Thiamine 100 mg IM daily x 3.  8. Levaquin 25 mg p.o. q.6h.  9. IV fluids.  10.      Inderal 10 mg p.o. t.i.d.  11.      Case worker consult.  12.      PSA.  13.      PAP.  14.      Pepcid 20 mg p.o. b.i.d.  15.      Walker to ambulate.  16.      Coumadin 100 mg p.o. b.i.d.  17.      Robitussin DM two teaspoons p.o. q.i.d.  18.      Bisacodyl.  19.      Darvocet-N 100 two tablets p.o. q.6h. p.r.n. for pain.     ___________________________________________                                         Barrie Folk. Berdine Addison, M.D.   Armanda Heritage  D:  11/11/2003  T:  11/11/2003  Job:  [id number]

## 2011-01-16 NOTE — Procedures (Signed)
NAME:  [name], [name] NO.:  [id number]   MEDICAL RECORD NO.:  [id number]           PATIENT TYPE:   LOCATION:                                 FACILITY:   PHYSICIAN:  Jacqulyn Ducking, M.D.  DATE OF BIRTH:  [birth year]   DATE OF PROCEDURE:  DATE OF DISCHARGE:                                EKG INTERPRETATION   1.  Normal sinus rhythm.  2.  Abnormal R wave progression.  3.  Borderline voltage criteria for left ventricular hypertrophy.      RR/MEDQ  D:  06/17/2004  T:  06/17/2004  Job:  [id number]

## 2011-01-16 NOTE — Discharge Summary (Signed)
NAME:  [name], [name] NO.:  [id number]   MEDICAL RECORD NO.:  [id number]          PATIENT TYPE:  INP   LOCATION:  [id number]                          FACILITY:  [facility]   PHYSICIAN:  Carole Civil, M.D.DATE OF BIRTH:  [birth year]   DATE OF ADMISSION:  06/16/2004  DATE OF DISCHARGE:  LH                                 DISCHARGE SUMMARY   ADMITTING DIAGNOSES:  Closed left tibial fibular fracture.   DISCHARGE DIAGNOSES:  Closed left tibial fibular fracture.   OPERATIVE PROCEDURE:  Closed intramedullary nailing left tibia.   SURGEON:  Dr. Aline Brochure   DATE OF SURGERY:  June 20, 2004   IMPLANTS USED:  Stryker T2 tibial nail with end cap and proximal and distal  locking screws.  We used a 390 x 12 mm nail.   HISTORY:  A 60 year old male was intoxicated.  [place] getting out of bed.  Fracture his left tibia and fibula.  Presented to the emergency room  complaining of pain and deformity.  Due to his intoxication, his surgery was  delayed until his alcohol level returned to normal and he was medically  cleared for surgery.   On October 21 he underwent uncomplicated surgery on his left tibia.  Postoperatively did well except for fever.  Fever work-up showed normal  chest x-ray and bacteria with 11 white cells per high powered field and few  bacteria.  He was started on Bactrim for that one DS b.i.d. for seven days.  On October 22 his potassium was 4.2, chloride 94, CO2 29, BUN/creatinine 14  and 1.3, sodium 130.  CBC was white count 13.5 and hemoglobin 9.8.  He had a  chest x-ray on the 23rd which showed no evidence of infiltrate or effusion.   He was undergoing physical therapy nonweightbearing which he will be for  four weeks.  He was able to ambulate 15 feet with a walker.  He transferred  sit to stand with maximum assist with one person.   At time of discharge his condition is stable.  He does have a UTI which will  be treated with Bactrim for seven days, stop date  November 2.  Jodell Cipro can  come out on October 31.  Weightbearing status:  Nonweightbearing left lower  extremity until November 21.  Follow-up visit with Dr. Aline Brochure in one month  from discharge.   DISPOSITION:  To [facility].    Cherlynn Kaiser  SEH/MEDQ  D:  06/24/2004  T:  06/24/2004  Job:  [id number]

## 2011-01-16 NOTE — H&P (Signed)
[facility]  Patient:    [name], GENS Visit Number: [id number] MRN: [id number]          Service Type: MED Location: 2A A211 01 Attending Physician:  Prentiss Bells Dictated by:   Rexene Alberts, M.D. Admit Date:  03/05/2002                           History and Physical  CHIEF COMPLAINT:  Bloody stools.  HISTORY OF PRESENT ILLNESS:  The patient is a 60 year old, African-American male with a past medical history significant for lower GI bleed, secondary to coloproctitis, rectal polyp and diverticular disease in 1998, alcohol abuse and hypertension who presented to the emergency department yesterday with a three-day history of red- and maroon-colored bloody stools.  The patient had approximately one to two bloody stools on day #1, followed by one to two stools on day #2, followed by three to four bloody stools on the day of admission.  The patient actually had two witnessed maroon-colored stools in the ED.  The patient has had associated intermittent lower abdominal crampy pain.  He had nausea and vomiting x1 yesterday, however, he denies blood in his emesis and he denies coffee-grounds emesis.  He admits to drinking approximately a fifth of gin three days ago, but denies any alcohol use over the past 24 hours.  The patient denies any recent or frequent use of Motrin, ibuprofen, BC powders, Goody powders or any other NSAIDs.  REVIEW OF SYSTEMS:  Positive for chronic left knee pain for which he takes Tylenol.  Review of systems is negative for fever, chills, chest pain, shortness of breath, painful urination or swelling in his legs with exception of occasional swollen left knee.  PAST MEDICAL HISTORY: 1. Hospitalization in March 1998, secondary to lower GI bleed.  Colonoscopy    revealed nonspecific coloproctitis, rectal polyp and diverticular disease.    Pathology for the polyp was read as an inflamed adenomatous polyp. 2. History of acute  gastroduodenitis per EGD in March 1998. 3. Hospitalization in November 1996, secondary to acute alcohol intoxication    and alcoholic hepatitis. 4. Hospitalization in 1994, secondary to acute alcoholic gastritis and acute    hepatitis. 5. History of acute rhabdomyolysis in July 1988. 6. Status post left knee arthroscopic surgery secondary to torn medial    collateral ligament, medial meniscus and anterior cruciate of the left knee    in September 1986. 7. History of acute renal failure secondary to dehydration per hospitalization    in July 2000. 8. History of hypertension. 9. Hospitalization in August 1999, secondary to acute alcohol intoxication.  MEDICATION:  "Blood pressure pill."  ALLERGIES:  No known drug allergies.  SOCIAL HISTORY:  The patient is single and lives with his brother in [place].  He is unemployed and disabled.  He says that he occasionally drinks alcohol once to twice a week.  He denies tobacco and street drug use.  He does not have any children.  He completed the fourth grade in school.  He cannot read and write, however, he can sign his name.  FAMILY HISTORY:  His mother is deceased and in her 90s secondary to complication of a stroke.  His father is deceased, cause unknown.  He has two brothers with a history of diabetes mellitus and two brothers with a history of hypertension.  He had one sister to die of a pulmonary embolism.  He has two other sisters who  have a history of diabetes and one with a history of hypertension.  PHYSICAL EXAMINATION:  VITAL SIGNS:  Temperature 98, pulse 96, respiratory rate 20, blood pressure 168/103.  Vitals on arrival to the floor showed temperature 98, pulse 80, respiratory rate 16, blood pressure 112/75.  GENERAL:  The patient is and alert, middle-aged, African-American male in no acute distress.  HEENT:  Head is normocephalic, atraumatic.  Pupils are equal, round and reactive to light.  Conjunctivae are  mildly injected bilaterally.  Sclerae are mildly icteric.  No proptosis or ptosis noted.  Oropharynx reveals good dentition.  Moist mucous membranes.  No exudates or erythema.  Nasal mucosa is moist.  No drainage.  No sinus tenderness.  NECK:  Supple with no adenopathy or thyromegaly.  No JVD.  LUNGS:  Clear to auscultation bilaterally.  HEART:  S1, S2 with no murmurs, rubs or gallops.  ABDOMEN:  Mildly obese, soft, nondistended.  There is some mild tenderness of the epigastrium without any rebound or guarding.  No appreciable hepatosplenomegaly.  RECTAL:  No external rectal lesions palpated or seen.  The patient has excellent rectal tone.  Prostate was palpable, smooth and round without any nodularity.  Stool is dark brown and guaiac positive.  There is at this time, no gross hematochezia.  EXTREMITIES:  The patient has arthritic changes with crepitus of the left knee.  He has a well-healed scar on his left knee.  He has a scant amount of effusion, however, no erythema or warmth to his left knee.  His pedal pulses are abounding bilaterally.  No pretibial edema.  NEUROLOGIC:  The patient is alert and oriented x3.  Cranial nerves II-XII intact.  Strength 5/5 throughout.  Sensation intact throughout.  No asterixis on exam.  LABORATORY DATA AND X-RAY FINDINGS:  Amylase 97, lipase 31.  CK 389, CK-MB 6.2, relative index 1.6, troponin I 0.01.  Alcohol less than 5.  Urine drug screen negative.  PT 12.4, INR 1.0, PTT 25.  WBC 5.2, hemoglobin 14.2, hematocrit 41.8, MCV 100.2, platelets 168.  Sodium 133, potassium 3.5, chloride 96, CO2 29, glucose 153, BUN 17, creatinine 1.1, total bilirubin 2.5, Alk phos 72, SGOT 50, SGPT 27, total protein 8.4, albumin 4.0, calcium 9.2.  ASSESSMENT: 1. Hematochezia.  The patient has a history of hematochezia in 1998, thought    to be secondary to diverticular disease, colon polyp and coloproctitis.    The patients hematochezia was probably prompted by  alcohol abuse.    Currently, his hemoglobin and hematocrit are within normal limits, but will     expect a drop in his hemoglobin and hematocrit after volume repletion. 2. Hypertension.  The patients initial blood pressures were very elevated.    However, once he was transferred to the floor, his blood pressures were    within normal limits.  He is currently being treated for hypertension,    however, the patient is unsure about the name of the blood pressure    medication. 3. Macrocytosis.  The patients macrocytosis is probably secondary to chronic    alcohol abuse.  PLAN:  1. Treatment was started in the emergency department when the patient was     given a bolus of normal saline.  2. Treatment and management will continue with the patient being admitted to     a telemetry bed.  Intravenous fluids at 150 cc an hour of normal saline     with 10 mEq of potassium chloride added.  3. Will type and  screen two units of packed red blood cells. Will continue to     monitor his hematocrit and hemoglobin daily.  Transfuse packed red blood     cells if his hemoglobin and hematocrit falls below the 9-10 range.  4. Consult Dr. Tamala Julian for colonoscopy evaluation.  5. Start IV Protonix 40 mg q.d.  6. Phenergan p.r.n.  7. Strict Is and Os.  8. Add multivitamin with iron and folate q.d.  9. Clear liquid diet until the patient is evaluated by Dr. Tamala Julian. 10. Start clonidine 0.1 mg p.o. t.i.d. and then titrate up or down as needed. 11. Ativan p.r.n. as needed for possible alcohol withdrawal.  The patient has     no history of alcohol withdrawal symptoms per review of past medical     chart.Dictated by:   Rexene Alberts, M.D. Attending Physician:  Prentiss Bells DD:  03/06/02 TD:  03/06/02 Job: [id number]

## 2011-01-16 NOTE — Op Note (Signed)
NAME:  [name], GOEDERT NO.:  [id number]   MEDICAL RECORD NO.:  [id number]          PATIENT TYPE:  INP   LOCATION:  [id number]                          FACILITY:  [facility]   PHYSICIAN:  Carole Civil, M.D.DATE OF BIRTH:  06-23-1951   DATE OF PROCEDURE:  06/29/2006  DATE OF DISCHARGE:                                 OPERATIVE REPORT   PREOPERATIVE DIAGNOSIS:  Osteoarthritis of the left knee.   POSTOPERATIVE DIAGNOSIS:  Osteoarthritis of the left knee.   PROCEDURE:  Left total knee arthroplasty.   IMPLANTS:  DePuy fixed bearing posterior stabilized #5 tibia, #4 femur, 38  patella, 17.5 polyethylene posterior stabilized insert.   SURGEON:  Carole Civil, M.D.   ANESTHESIA:  Spinal.   ASSISTANT:  Patsi Sears.   HISTORY:  This is a 60 year old male who had a previous medial collateral  ligament staple repair over 20 years ago.  Subsequently, he had a fracture  of his left tibial shaft treated with intramedullary nailing. At the time of  surgery and injury, he was noted to have osteoarthritis of the knee.  This  progressed. His nail was removed in preparation for total knee replacement.  He continued to have pain and presented for such.   OPERATIVE FINDINGS:  There was a valgus knee with actually good extension on  preoperative evaluation. Flexion was approximately 125 degrees under  anesthesia. There was no detectable collateral ligament instability medial  or lateral with varus valgus stress in flexion or extension.  We did remove  one staple which was easily visible and easily removed after the posterior  cut.   DESCRIPTION OF PROCEDURE:  The patient was identified in the preop holding  area as [name]. He marked his left knee as the surgical site, I  counter-signed it, updated the history and physical, and then started his  antibiotics.  He was taken to the operating for a spinal anesthetic.  He had  a spinal anesthetic.  A Foley catheter was  placed with some difficulty but  clear urine was eventually noted in the catheter. His left leg was then  prepped and draped using a sterile technique.  At that time, we did an exam  under anesthesia and I listed the findings as noted above.  The time-out  procedure was completed. The limb was, as stated, prepped and draped.  The  tourniquet was elevated after exsanguination of the limb and it stayed up  for two hours.   With the knee flexed, a straight incision was made over the left knee.  The  subcutaneous tissue was divided.  The extensor mechanism was exposed.  A  medial arthrotomy was performed and medial release was done to the mid  portion of the tibia.  There was a significant amount of scarring in the  patellofemoral mechanism lateral compartment and this was released including  the lateral patellofemoral ligament.  The patella was subluxated laterally,  everted, and the knee was flexed. The guide for the tibial cut was set for  neutral cut and set for 10 mm resection referencing from  the medial unworn  side.  The sagittal and coronal plane alignment was checked and the tibial  cutting block was pinned in place.  We resected 10 mm of bone from the tibia  and then sized the tibia to a size 5, checked alignment with the external  alignment rod and we were satisfied with that and proceeded to introduce a  drill bit into the femoral canal.  We suctioned and irrigated the canal and  then set the distal cutting block for the femur at a 9.  We resected the 9  mm of bone but noted that there would be no removal of bone from the femoral  condyle so we resected an additional 4 mm of bone and checked the extension  gap.  This allowed a 10 mm block to be placed but the lateral compartment  was significantly tight compared to medial so we released the iliotibial  band. Although this opened up the lateral compartment, it did not completely  balance the ligaments.   We then proceeded to  measure the femur.  It measured between a 4 and a 5.  We downsized to a size 4, made the 4 cuts, and then checked  flexion/extension gaps again. The lateral side remained tight, so we did a  subperiosteal release of the lateral collateral ligament complex in  continuity with the periosteum of the lateral shaft of the femur until both  ligaments were equally tensioned in extension and flexion.  We used a  laminar spreader to assist in this and did partial releases until we could  get equal tension on both ligaments. We checked the extension and flexion  gaps with 10, 12.5, 15, and 17.5 spacer blocks and the 17.5 block was the  best fit. We then made the box cut, did a trial reduction with 17.5  polyethylene trial insert, ranged the knee, saw no need for a patellofemoral  release as the patella tracked well.  We then set the tibial tray and  completed the reaming and punching and marked the rotation. We then cut the  patella from a 24 to a 14, used a 38 patella with a 9 mm thickness.   We drilled the three peg holes and then prepped the knee for cementing by  irrigating and drying the bone surfaces, removed any excessive bone  fragments by irrigation. We mixed our cement, cemented our components in  place and then let the cement cure.   We then trialed 15 and 17.5 polyethylene inserts.  I was more satisfied with  the 17.5 as stability was very good.  We then removed any excess cement,  irrigated the knee again, put in the normal 17.5 polyethylene insert, and  checked range of motion.  We were able to obtain range of motion of 0 to 120  degrees with no touch technique, no patella subluxation was noted, and with  passive flexion, we were at 120 degrees. Medial and lateral collateral  ligament instability was very good with this insert.   We then irrigated the knee and closed over a suction drain and pain pump catheter with #1 Bralon, 0 and 2-0 Monocryl.  Sterile dressings were applied  to  the skin along with a Cryo/cuff. The patient was taken to the recovery  room in stable condition.   ADDENDUM:  The count was not correct.  The count indicated that there was an  extra blade that was not counted initially. There was no missing  instruments, blades, or needles,  but the count at the end of the case  indicated eight blades where, in the beginning, there was only seven. So, we  took an x-ray interoperatively.  On [facility] x-ray interoperatively, the tibial  tray is in 5 degrees of varus.  This is more than what we would normally  like to see, however, the ligaments are balanced and we will address this  once the patient gets up and walks and with serial radiographs throughout  the postoperative course. If there is a problem, we will need to revise the  tibial tray. The  most likely cause of this is the tibia fracture which although it looks to  be in good alignment, it may have altered the overall alignment of the limb.  I will discuss this with the patient once he is recovered from postoperative  anesthesia and medication.      Carole Civil, M.D.  Electronically Signed     SEH/MEDQ  D:  06/29/2006  T:  06/29/2006  Job:  [id number]

## 2011-01-16 NOTE — Discharge Summary (Signed)
NAME:  [name], [name] NO.:  [id number]   MEDICAL RECORD NO.:  [id number]          PATIENT TYPE:  INP   LOCATION:  [id number]                          FACILITY:  [facility]   PHYSICIAN:  Carole Civil, M.D.DATE OF BIRTH:  [birth year]   DATE OF ADMISSION:  06/29/2006  DATE OF DISCHARGE:  11/05/2007LH                                 DISCHARGE SUMMARY   ADMISSION DIAGNOSIS:  Osteoarthritis left knee.   DISCHARGE DIAGNOSIS:  Same.   SECONDARY DIAGNOSIS:  Acute blood loss anemia.   OPERATIVE PROCEDURE:  Left total knee replacement.  Implants.  Depew fixed  bearing posterior stabilized system, 5 tibia, 4 femur, 38 patella, 17.5  polyethelene posterior stabilized insert.   SURGEON:  Dr.  Aline Brochure.   ANESTHETIC:  Spinal.   COMPLICATIONS OF SURGERY:  None.   HISTORY OF PRESENT ILLNESS:  This is a 60 year old male status post removal  of intramedullary nail for fractured left tibia.  Also status post staple  fixation of a medial collateral ligament tear many years ago.  Presented  with progressive osteoarthritis and pain in his left knee which failed  conservative treatment.  He presented with a osteoarthritic valgus knee.   HOSPITAL COURSE:  The patient was admitted on 10/30.  Underwent surgery on  the same day.  He tolerated the surgery well.  Was taken back to the floor,  started on total knee protocol using Coumadin as DVT prevention.  He did  well except for bleeding from the knee.  This was treated with compression  dressings and this subsequently resolved.  He did have 4 units of blood.  His starting hemoglobin on admission was 13.6.  On 11/02 it dropped to 8.9  and after 4 units of blood he was 9.8.  He actually had no real symptoms  from this other than some low-grade tachycardia.  He was able to ambulate  with physical therapy approximately 100 feet, passive range of motion  revealed 85 degrees of flexion with about -5 degrees of extension.   His INR was 2.7  on the day of 11/05.  He is currently on 5 mg of Coumadin  per day to be adjusted to obtain a 2-3 INR.  Bleeding from his knee has  stopped.  He did have some low-grade temperatures.  He was worked up for  fever.  Fever was found to have no specific cause most likely due to  atelectasis.   He is discharged to the nursing facility of [facility] on the following  medications:   1. Metoprolol.  2. Lopressor 25 mg daily.  3. Norvasc 5 mg daily is being held.  4. Lisinopril 20 mg daily is being held.  5. Lasix 20 mg daily held.  6. Avapro 300 mg daily.  7. Iron 325 mg t.i.d.  8. Colace 100 mg b.i.d.  9. Coumadin 5 mg a day to be adjusted to maintain INR of 2-3.  Coumadin      can be stopped on 11/10. Allopurinol 100 mg a day.  10.Telex 1 q. 4 p.r.n. for pain.  11.Tylenol 325 to 650  q. 4 p.r.n. for fever greater than 101.5.   Staples can be removed from the knee on 11/11.  The patient should follow-up  with Dr. Aline Brochure on 11/20.   Wound care dressings as required.   Physical therapy, weight bearing as tolerated with assistive device.  Passive range of motion daily to obtain a range of motion of at least 0-110.  No CPM is necessary.  Please do not put any pillows underneath the knee.   DISPOSITION:  To [facility] overall condition is improved.      Carole Civil, M.D.  Electronically Signed     SEH/MEDQ  D:  07/05/2006  T:  07/05/2006  Job:  [id number]

## 2011-01-16 NOTE — Procedures (Signed)
NAME:  [name], SHALL NO.:  [id number]   MEDICAL RECORD NO.:  [id number]          PATIENT TYPE:  OUT   LOCATION:  RAD                           FACILITY:  [facility]   PHYSICIAN:  Leslye Peer, MD       DATE OF BIRTH:  10/07/1950   DATE OF PROCEDURE:  DATE OF DISCHARGE:                                  ECHOCARDIOGRAM   REFERRING PHYSICIANS:  Dr. Weston Settle and Leslye Peer, M.D.   INDICATIONS:  This 60 year old gentleman with a past medical history of an  abnormal EKG and hypertension who was referred for evaluation of LV  function.   The technical quality of the study is reasonable.   1.  The aorta measures normally at 3.8-cm.  2.  The left atrium is mildly dilated, measured at 4.3-cm.  3.  The patient appeared to be in sinus rhythm during this procedure.  4.  __________ and posterior wall are mildly thickened.  5.  The aortic valve appears to be reasonably structurally normal.  No      significant aortic insufficiency is noted.   DOPPLER INTERROGATION:  1.  The aortic valve is within normal limits.  2.  The mitral valve exhibits normal leaflet excursion.  No mitral valve      prolapse is noted.  Mild mitral regurgitation is noted.  There is a      small mobile echodensity just below the mitral valve which is most      consistent with either a Lambl excrescence, torn minor subvalvular      apparatus, or lax chordae; however, the possibility of vegetation cannot      be entirely excluded on this study but the location and character would      be quite unusual for this.  3.  The pulmonic valve is incompletely visualized but appeared to be grossly      structurally normal.  4.  The tricuspid valve also appears grossly structurally normal with mild      tricuspid regurgitation noted.  5.  The left ventricle is normal in size with the LV IDD measured at 4.4-cm.      The LV IC measured at 3.5-cm.  Overall left ventricular ejection      fraction appears  preserved.  In some views, there is a subtle suggestion      of posterior wall hypokinesis, however, this is not well visualized.      The apex is also poorly visualized.  Again, overall ejection fraction      appears to be preserved.  The presence of diastolic dysfunction is      inferred from pulse wave Doppler across the mitral valve.  6.  The right atrium and right ventricle appear normal in size and right      ventricular systolic function appears normal.   IMPRESSION:  1.  Mild left atrial enlargement.  2.  Mild concentric left ventricular hypertrophy.  3.  Trivial mitral and mild tricuspid regurgitation.  4.  A small mobile echodensity just below the mitral valve which is most  consistent with either a Lambl excrescence, torn minor subvalvular      apparatus, or lax chordae, however, the possibility of vegetation cannot      be entirely excluded on this study, although the location and character      would be quite unusual for this.  5.  Normal left ventricular size with preserved overall ejection fraction.      In some views, there is a subtle suggestion of posterior wall      hypokinesis but this is not well visualized.  Additionally, the apex is      not well seen.  6.  Presence of diastolic dysfunction is inferred from pulse wave Doppler      across the mitral valve.           ______________________________  Leslye Peer, MD     AB/MEDQ  D:  04/02/2006  T:  04/02/2006  Job:  [id number]   cc:   Weston Settle, Dr.

## 2011-01-16 NOTE — Discharge Summary (Signed)
   NAME:  [name], [name]                           ACCOUNT NO.:  [id number]   MEDICAL RECORD NO.:  [id number]                   PATIENT TYPE:  INP   LOCATION:  A330                                 FACILITY:  [facility]   PHYSICIAN:  J. Sanjuana Kava, M.D.              DATE OF BIRTH:  10/05/1950   DATE OF ADMISSION:  05/18/2002  DATE OF DISCHARGE:  05/20/2002                                 DISCHARGE SUMMARY   DISCHARGE DIAGNOSIS:  Comminuted fracture of the right distal radius.   PROCEDURE PERFORMED:  Open reduction and internal fixation right distal  radius.   OTHER DIAGNOSIS:  History of hypertension.   HISTORY:  The patient sustained a significant comminuted fracture of his  distal radius on the first of September.  He was seen in the office on the  second.  We put him in a cast. X-rays on the 9th showed the fracture had  slightly slipped and was recommended surgery and wanted to think about it  and came to the office on the 15th and agreed to the procedure.   HOSPITAL COURSE:  The patient was taken to surgery on the day of admission  on the 18th of September and underwent ORIF.  He tolerated it well.  The  next day postop he was afebrile.  He had some edema. Complained of pain.  He  was on IV morphine patient-controlled analgesic.  The following day the pain  was significantly less, was neurovascularly intact and the swelling down.  He was discharged home.   DISCHARGE INSTRUCTIONS:  He is to see in my office after discharge in ten  days; arrangements have been made.  He is to take Tylox every four hours as needed for pain.   LAB WORK:  Within normal limits.  EKG negative.                                               [name], M.D.    JWK/MEDQ  D:  06/15/2002  T:  06/15/2002  Job:  [id number]

## 2011-01-16 NOTE — H&P (Signed)
NAME:  [name], BELMAN                 ACCOUNT NO.:  [id number]   MEDICAL RECORD NO.:  [id number]          PATIENT TYPE:  INP   LOCATION:  A215                          FACILITY:  [facility]   PHYSICIAN:  Toby L. Fugate, D.O.   DATE OF BIRTH:  Nov 16, 1950   DATE OF ADMISSION:  03/09/2006  DATE OF DISCHARGE:  LH                                HISTORY & PHYSICAL   PRIMARY CARE PHYSICIAN:  Dr. Berdine Addison.   REASON FOR VISIT:  Nausea, vomiting, abdominal pain.   HISTORY OF PRESENT ILLNESS:  [name] is a 60 year old African-American  male who presents to [facility] emergency room tonight complaining of  abdominal pain, nausea, and vomiting that began earlier today.  Yesterday,  he was in his normal state of health.  He did have a little bit of decrease  in appetite, however.  The only thing he had to eat yesterday was a  hamburger that he prepared himself.  He has had no sick contacts.  He lives  alone.  In addition, the patient has been complaining of fevers and chills.  Of note, the patient recently had an orthopedic rod removed from his tibia  in June 2007.  He tolerated the procedure well.  He also has a history of  pancreatitis.  This is thought to be related to alcohol.  He said he last  drank alcohol on Sunday.  In general, he has cut back on his alcoholic  consumption.  His gallbladder was removed in the past.  He denies chest  pain.   PAST MEDICAL HISTORY/PAST SURGICAL HISTORY:  1.  Pancreatitis  2.  Alcohol abuse.  3.  B12 deficiency.  4.  Anemia.  5.  Hypertension.  6.  Gastroesophageal reflux disease.  7.  Intramedullary nail repair of tib-fib fracture (October 2006) status      post removal of nail (February 16, 2006).  8.  Status post cholecystectomy.  9.  Alcoholic gastritis.  10. DJD at the left knee status post removal of meniscus.  11. History of rhabdo (question related to alcohol abuse).  12. Trichomonas infection.   MEDICATIONS:  1.  Amlodipine/benazepril 5/20 mg p.o.  daily.  2.  Furosemide 20 mg p.o. daily.  3.  Metoprolol 25 mg p.o. b.i.d.  4.  Protonix 40 mg p.o. daily.  5.  Thiamine 100 mg p.o. daily.   ALLERGIES:  NO KNOWN DRUG ALLERGIES.   SOCIAL HISTORY:  He denies tobacco abuse.  He does drink alcohol heavily.  He has cut back some.  No IV drug abuse.  The patient lives alone.   FAMILY HISTORY:  Noncontributory.   REVIEW OF SYSTEMS:  A complete 12-point review of systems was obtained.  The  review was negative except for that stated in HPI.   PHYSICAL EXAMINATION:  VITAL SIGNS:  Temperature 101.8, blood pressure  133/97, pulse 116, respiratory rate 18.  HEENT:  Pupils equally round and reactive to light.  Extraocular muscles  intact.  No scleral icterus.  Oropharynx clear and moist.  No erythema or  thrush.  NECK:  No JVD,  no carotid bruit, no adenopathy.  HEART:  Regular rate and rhythm.  No murmurs, rubs or gallops.  LUNGS:  Clear to auscultation bilaterally.  No wheezes, rales or rhonchi.  ABDOMEN:  Positive bowel sounds.  Epigastric tenderness.  No rebound or  guarding.  No hepatosplenomegaly.  EXTREMITIES:  No edema, clubbing or cyanosis.  Steri-Strips in place.  NEUROLOGICAL:  Cranial nerves II-XII intact.  No focal deficits.  DTRs 2/4  in all extremities.  Strength 5/5 in all extremities.   LABORATORIES:  Sodium 140, potassium 3.2, chloride 107, carbon dioxide 19,  glucose 147, BUN 7, creatinine 1.4, calcium 8.9, total protein 8.7, albumin  3.8, AST 59, ALT 31, bilirubin 1.6.  White blood cell count 8.1, hemoglobin  14.3, hematocrit 42, platelets 172, increased bands (greater than 20%).  UA:  Negative urine nitrate, negative leukocyte esterase, 3-6 white blood cells,  rare bacteria.  Lipase 29.  EKG showed sinus tachycardia.  Acute abdominal  series was unremarkable.   ASSESSMENT/PLAN:  1.  Abdominal pain, nausea, vomiting, fever.  Differential includes      gastroenteritis versus mild pancreatitis versus other.  I will admit  the      patient to a telemetry bed.  I will provide morphine 2 mg IV every 3      hours as needed for pain.  For nausea and vomiting, I will provide      Phenergan as needed.  The patient will receive IV fluids.  I will repeat      lipase in the a.m.  I will request hepatitis A IgM.  I will hold the      patient's Lasix and other oral medications.  Given that the patient has      20% bandemia based on a CBC, I will start empiric Zosyn.  2.  Fever.  One blood culture has been drawn in the ED.  We need to keep in      mind that the patient did recently have an orthopedic procedure.  He      could have seeded his blood.  3.  Tachycardia.  This is likely secondary to pain.  The patient will be      admitted to a telemetry bed.  4.  Hypokalemia.  I will add potassium chloride to the patient's IV fluids.  5.  Hypertension.  The patient's blood pressure is under good control now.      Given his nausea and vomiting, I will hold his oral medications.  6.  Alcohol abuse.  I will monitor the patient closely for signs of DTs.  7.  This patient is a full code.      Toby L. Fugate, D.O.  Electronically Signed     TLF/MEDQ  D:  03/09/2006  T:  03/09/2006  Job:  [id number]

## 2011-01-16 NOTE — Consult Note (Signed)
NAME:  [name], [name] NO.:  [id number]   MEDICAL RECORD NO.:  [id number]          PATIENT TYPE:  INP   LOCATION:  A338                          FACILITY:  [facility]   PHYSICIAN:  Barrie Folk. Berdine Addison, M.D.   DATE OF BIRTH:  28-Jun-1951   DATE OF CONSULTATION:  06/16/2004  DATE OF DISCHARGE:                                   CONSULTATION   FAMILY PRACTICE CONSULTATION   HISTORY OF PRESENT ILLNESS:  The patient is a 60 year old, single,  unemployed black male from [place], [place].  The patient was  admitted by Dr. Arther Abbott, orthopedics, for treatment of acute left  tibia/fibular fracture.  The patient stated that he experienced a fall  getting out of bed around 1400 at home on the day of admission.  He  experienced significant pain to his left leg secondary to fall.   PAST MEDICAL HISTORY:  1.  Positive for hypertension.  2.  Ethanol use.  3.  Chronic anemia of iron deficiency.  4.  Severe degenerative joint disease of the left knee.   History is negative for diabetes, tuberculosis, cancer, sickle cell, asthma,  seizure disorder.   CURRENT MEDICATIONS:  1.  Aciphex 20 mg p.o. daily.  2.  Inderal 2 mg p.o.  3.  Hemocyte Plus one tablet p.o. every day.  4.  _________ 1 mg p.o. every day.   ALLERGIES:  The patient is not allergic to any known medication.   HABITS:  Negative for tobacco or street drugs.   SOCIAL HISTORY:  Sexually transmitted disease is positive for treated  Gonorrhea infection x1 as a teenager.   PAST MEDICAL HISTORY:  1.  Hospitalization for chronic left knee effusion March 2005.  2.  Hospitalization for rectal bleeding March 1998.  3.  Acute alcohol intoxication and alcoholic hepatitis in [id number].  4.  Alcohol gastritis and alcohol hepatitis in May 1994.  5.  Acute rhabdomyolysis in July 1998.  6.  Dizziness secondary to orthostatic changes due to multifactorial origin.  7.  Hematochezia in August 1996.  8.  Chronic  cholecystitis treated with laparoscopic cholecystectomy in      October 2003.   FAMILY HISTORY:  Mother deceased in her 99's secondary to complications of a  stroke.  Father deceased, cause unknown.  One brother deceased secondary to  complications of sepsis and end-stage renal disease.  Two brothers living,  one with history of diabetes mellitus and another brother health unknown.  Two sisters living, one with history of diabetes mellitus in her 59's and  the other sister, cause unknown.  One sister is deceased, cause unknown.  The patient has no children.   REVIEW OF SYSTEMS:  Positive for chronic left knee pain and stiffness.  Review of systems is negative for epistaxis, bleeding gums, syncope,  hemoptysis, shortness of breath, nausea, vomiting, hematemesis, gross  hematuria, dysuria, rectal bleeding, melena, diarrhea, weight loss, loss of  appetite, night sweats, etc.   PHYSICAL EXAMINATION:  VITAL SIGNS:  Temperature 97.1, blood pressure  125/78, pulse 90, respirations 20.  GENERAL:  General appearance reveals  middle-aged, tall, medium frame, alert,  black male in no apparent respiratory distress.  SKIN: Warm and dry.  HEENT:  Normocephalic.  Ears - normal auricles,  external canals positive  cerumen bilaterally.  Eyes clear, negative ptosis, sclerae muddy.  Pupils  round, equal and reactive to light.  Extraocular movement intact.  Nose  negative for discharge.  Mouth with fair dentition.  No bleeding gums, no  oral lesions.  Posterior pharynx benign.  NECK:  Negative for lymphadenopathy or thyromegaly.  LUNGS:  Clear.  HEART:  Regular rate and rhythm.  ABDOMEN:  Slightly obese.  Hyperactive bowel sounds.  Soft.  Nontender in  all four quadrants.  No palpable masses or organomegaly.  GENITALIA:  Penis circumcised.  No penile lesions or discharge.  Scrotum  shows palpable testicles without nodule or tenderness.  RECTAL:  Deferred.  EXTREMITIES:  Left knee positive for swelling,  stiffness, tenderness and  crepitus with flexion.  Left tibia positive for Ace bandage with a soft cast-  like structure.  Right lower extremity shows no swelling of the knee, no  tenderness of the knee on palpation.  No tibial edema, palpable right  dorsalis pedis.  NEUROLOGIC:  Alert and oriented to person, place and time.  Cranial nerves  II-XII appeared intact.   LABORATORY DATA:  White count 12.6, hemoglobin 12.1, hematocrit 36.9,  platelets 174,000.  Prothrombin time 13.2, INR 1, positive thromboplastin  time 27.  Sodium 140, potassium 3.9, chloride 107, CO2 27, glucose 106, BUN  11, creatinine 1, calcium 8.7.  Labs pending are magnesium and urinalysis.  Ethanol level 232 mg/dl.   PLAN:  1.  Liver function tests.  2.  Librium 10 mg p.o. t.i.d..  3.  IV fluids.  4.  One multivitamin tablet p.o. every day.  5.  Thiamine 100 mg IM daily x3.  6.  Antihypertensive medications.  7.  Low sodium diet.  8.  Analgesia for pain.  9.  Case worker consult as needed.      GKH/MEDQ  D:  06/16/2004  T:  06/16/2004  Job:  [id number]

## 2011-01-16 NOTE — Procedures (Signed)
NAME:  [name], [name]                           ACCOUNT NO.:  [id number]   MEDICAL RECORD NO.:  [id number]                   PATIENT TYPE:  OUT   LOCATION:  RAD                                  FACILITY:  [facility]   PHYSICIAN:  Bryson Dames, M.D.             DATE OF BIRTH:  1951/06/09   DATE OF PROCEDURE:  04/21/2004  DATE OF DISCHARGE:                                  ECHOCARDIOGRAM   REFERRING PHYSICIAN:  Iona Beard, M.D.   INDICATIONS FOR PROCEDURE:  Chest pain, shortness of breath, and  hypertension. This study is adequate for interpretation.   RESULTS:  1. Aortic valve:  The aortic valve is normal in appearance with no evidence     of stenosis or regurgitation, and the valve shows 3 normal leaflets.  2. Mitral valve:  Grossly normal. No stenosis or regurgitation. Pulmonic     valve not well seen. Tricuspid valve not well seen.  3. Aorta:  Normal aortic root dimension 3.6.  4. Left atrium:  Normal left atrial size of 3.1.  5. Right atrium:  Normal.  6. Right ventricle:  The right ventricular chamber is mildly dilated.  7. Left ventricle:  The left ventricle shows normal contractility,     estimation of ejection fraction 123456, end-systolic dimension 2.9, and     diastolic dimension of 4.5. There is asymmetric septal hypertrophy with     septum measuring 1.6, posterior wall measuring 1.1 to 1.2. No evidence of     aortic outflow track obstruction or subvalvular gradient. The mitral     inflow signal was compatible with left ventricular relaxation     abnormalities.  8. Pericardium:  No effusions seen.   FINAL DIAGNOSES:  1. Unremarkable valvular function.  2. Right ventricular enlargement.  3. Asymmetric septal hypertrophy, mild.  4. Diastolic relaxation abnormality noted.  5. Left ventricular ejection fraction 60%.      ___________________________________________                                            Bryson Dames, M.D.   WHG/MEDQ  D:  04/21/2004  T:   04/22/2004  Job:  [id number]

## 2011-01-16 NOTE — Op Note (Signed)
NAME:  [name], [name] NO.:  [id number]   MEDICAL RECORD NO.:  [id number]          PATIENT TYPE:  AMB   LOCATION:  DAY                           FACILITY:  [facility]   PHYSICIAN:  Carole Civil, M.D.DATE OF BIRTH:  1951/01/30   DATE OF PROCEDURE:  02/16/2006  DATE OF DISCHARGE:                                 OPERATIVE REPORT   PREOPERATIVE DIAGNOSIS:  Fractured left tibia with hardware in place and  pain in the knee.   POSTOPERATIVE DIAGNOSIS:  Fractured left tibia with hardware in place and  pain in the knee.   PROCEDURE:  Intramedullary nail removal left tibia.   SURGEON:  Carole Civil, M.D.   ANESTHETIC:  Spinal.   ASSISTANT:  Cunningham, RNFA   OPERATIVE FINDINGS:  Intact nail, healed fracture, three screws removed.   DESCRIPTION OF PROCEDURE:  The patient was identified as [name] in the  preop holding area, left leg marked for surgery, counter-signed by the  surgeon.  History and physical updated. Antibiotic started, taken to the  operating room for spinal anesthetic, placed supine, tourniquet left thigh.  After sterile prep and tape, time-out procedure completed.   We removed the distal screw first then the proximal screw, we put the  tourniquet up, flexed the knee, made an incision in the previous incision,  split the patellar tendon, took off the end cap, attached the removal device  and removed the nail intact.  We irrigated and close all the wounds using 2-  0 Monocryl on the stab wounds for the two screws that were removed and then  #1 Vicryl in the patellar tendon and 2-0 Monocryl in the knee incision along  with staples.  We injected 30 mL of Marcaine with epinephrine.  We applied  sterile dressings and the patient was taken to the recovery room in stable  condition.   POSTOP PLAN:  Discharged on Vicodin 5 mg q.4h. p.r.n. for pain, #30.  Follow-  up in two days.  He is weight-bearing with crutches.      Carole Civil, M.D.  Electronically Signed    SEH/MEDQ  D:  02/16/2006  T:  02/16/2006  Job:  [id number]

## 2011-01-16 NOTE — H&P (Signed)
NAME:  [name], [name]                           ACCOUNT NO.:  [id number]   MEDICAL RECORD NO.:  [id number]                   PATIENT TYPE:  AMB   LOCATION:  DAY                                  F[facility]ILITY:  [facility]   PHYSICIAN:  J. Sanjuana Kava, M.D.              DATE OF BIRTH:  August 29, 1951   DATE OF ADMISSION:  DATE OF DISCHARGE:                                HISTORY & PHYSICAL   INTRODUCTION:  The patient is scheduled for surgery on the 18th.   CHIEF COMPLAINT:  I broke my wrist.   HISTORY OF PRESENT ILLNESS:  The patient is a 60 year old male who sustained  a comminuted fracture of his distal radius on the right on the first day of  September, seen in the office on the 2nd, put in a cast, came back on the  9th, fracture had slipped slightly, surgery was recommended.  He wanted to  delay surgery, he thought about it, came back to the office on the 15th and  has agreed to surgery.  He understands that he has a comminuted fracture  distal radius, underwent surgery, concerns of arthritis.  He has some  changes in the wrist joint itself at the current time.   PAST MEDICAL HISTORY:  The patient has a history of hypertension.  No other  injuries.  Denies any heart disease, lung disease, kidney disease, stroke,  paralysis, weakness, TB, diabetes, rheumatic fever, cancer, palsy disease,  circulatory problems.  He had left knee surgery years ago.   ALLERGIES:  Denies any allergies.   MEDICATIONS:  He takes medicine for blood pressure which is Inderal 10 mg  p.o. b.i.d., and he also takes clonidine 0.1 mg a day.  He was given Vicodin  ES for pain, and Bextra 20 mg for anti-inflammatory effect.   SOCIAL HISTORY:  He does not smoke.  Uses alcoholic beverages socially.  He  only went through the fourth grade in school. The patient is not married,  lives here in [place].  Dr. Berdine Addison is his family doctor.   FAMILY HISTORY:  Hypertension and diabetes runs in the family.  Both parents  are  dead secondary to cancer.   PHYSICAL EXAMINATION:  VITAL SIGNS:  BP is 142/82, pulse 76 with some  irregularity, respirations 16, afebrile.  Height 6 feet, weight 223.  GENERAL:  The patient is well cooperative and oriented.  HEENT:  Negative.  NECK:  Supple.  LUNGS:  Clear to P&A.  HEART:  Regular rate and rhythm.  No murmur heard.  ABDOMEN:  Soft, nontender without masses.  EXTREMITIES:  Right arm in a sugar tong splint.  Neurovascularly intact  without some swelling.  Other extremities within normal limits.  NEUROLOGICAL:  Intact.  SKIN:  Intact.    IMPRESSION:  1. Comminuted fracture distal radius on the right.  For ___________.  Risks     and imponderables have been explained  to the patient.  2. History of hypertension and irregular heart beat.   Labs are pending.                                               [name], M.D.    JWK/MEDQ  D:  05/16/2002  T:  05/16/2002  Job:  [id number]

## 2011-01-16 NOTE — Discharge Summary (Signed)
NAME:  [name], [name]                           ACCOUNT NO.:  [id number]   MEDICAL RECORD NO.:  [id number]                   PATIENT TYPE:  INP   LOCATION:  A330                                 FACILITY:  [facility]   PHYSICIAN:  Barrie Folk. Berdine Addison, M.D.                DATE OF BIRTH:  29-Aug-1951   DATE OF ADMISSION:  11/10/2003  DATE OF DISCHARGE:  11/14/2003                                 DISCHARGE SUMMARY   CHIEF COMPLAINT:  Painful, swollen left knee.   HISTORY OF PRESENT ILLNESS:  The patient was a 60 year old single,  unemployed black male from [place], [place].  The patient had been  experiencing chronic pain and swelling of the left knee.  The patient  indicated the pain of the left knee was severe secondary to attempt to  weightbear.  He described the pain as a burning, stabbing sensation.  History is also positive for warmth of left knee.  He had been treated with  pain to chronic left knee, swelling and pain by Dr. [name], local  orthopedist, over the past two months. The patient denied recent fall,  redness, or discoloration of left knee.  History is also negative for fever  or chills.   Past surgical history is positive for arthroscopic surgery of left knee  secondary to torn medial collateral ligaments, medial meniscus and anterior  cruciate in September 1986.  ER physician performed aspiration of left knee  during this admission.  Fluid was described as many wbc's, no organisms,  yellow color, appearance hazy, white count of 246 (normal 0 to 200),  neutrophils 94 (normal 0 to 25), crystal normal, glucose 30 mg/dL, total  protein 4.8 gm/dL.  The patient also had culture performed on fluid as well  as blood cultures.  He was given Rocephin 1 gm IV in the emergency room.  ER  physician felt that the patient possibly had a joint infection of the left  knee.  Medical history is also positive for hypertension and chronic ethanol  use.   ALLERGIES:  The patient  denies being allergic to any medications.   HABITS:  Negative for tobacco and street drugs.   SEXUALLY TRANSMITTED DISEASE:  Positive for treated gonorrhea infection x 1  as a teenager.   PAST MEDICAL HISTORY:  1. Positive for hospitalization for rectal bleeding in March 1998.  2. Alcohol intoxication with alcohol hepatitis in 1996.  3. Alcohol gastritis and alcohol hepatitis in May of 1994.  4. Acute rhabdomyolysis in July of 1998.  5. Dizziness secondary to orthostatic changes due to multifactorial origin.  6. Chronic ethanol use.  7. Chronic renal insufficiency.  8. Urinary tract infection.  9. Hematochezia in August of 1996.  10.      Chronic cholecystitis treated with laparoscopic cholecystectomy in     October of 2003.   FAMILY HISTORY:  Revealed mother deceased in her 54s  secondary to  complication of stroke.  Father deceased cause unknown.  One brother  deceased secondary to complication of sepsis and end-stage renal disease.  Two brothers living-one with history of diabetes mellitus and other health  unknown.  Two sisters living-one with history of diabetes mellitus and other  health unknown.  One sister deceased cause unknown.  The patient has no  children.   HOSPITAL COURSE:  Problem 1:  CHRONIC LEFT KNEE EFFUSION:  Lung fields on  admission were clear.  Heart exam revealed audible S1 and S2 rales, murmur.  Rhythm was regular.  Rate within normal limits at the time of exam.  Examination of left knee was positive for significant swelling and old  healed medial surgical scar.  Exam revealed tenderness with slight infection  of the left knee and warmth on palpation.  Examination of the knee  demonstrated no discoloration.  Right knee demonstrated no swelling,  redness, heat, or tenderness with flexion and extension.  Significant labs  on admission were as follows:  White count 12,900, hemoglobin 12.5,  hematocrit 36.6, platelets 232,000.  Sodium 135, potassium 3.8,  chloride  103, CO2 23, glucose 107, BUN 15, creatinine 1.3, calcium 9.1.   X-ray of left knee was read as new large knee joint effusion and mild soft  tissue swelling along the anterior medial aspect of the knee.  No evidence  of acute bony abnormality.  Tricompartmental osteoarthritis most severe  joint space narrowing of the lateral compartment.  The patient was treated  with IV Ancef and p.o. Levaquin.  Blood cultures were also done.  During hospitalization, demonstrated no growth after two days pertaining to  fluid extracted from knee.  Blood cultures demonstrated no growth after  three days.   The patient has remained afebrile throughout this hospitalization.  An  orthopedic consult, Dr. [name], was ordered during this  hospitalization.  The patient continued to have significant swelling of left  knee at the time of discharge.  He required the use of a walker to ambulate.  The patient will be referred to Dr. [name] for further evaluation  as an outpatient.  The patient is alert and oriented to person, place, and  time.   Last white count was 12.1 on November 12, 2003.   Problem 2:  CHRONIC ANEMIA:  Hemoglobin on November 12, 2003 was 10.2 and  hematocrit was 30.0.  Anemia panel reticulocyte count were done and revealed  the following:  Iron 15 mg/dL, total iron binding capacity 218 mg/dL, iron  percent saturation was 7% (normal 20 to 55), B12 541 pg/m, folate 245 ng/m,  and ferritin 334 ng/m.  The patient was started on hemocyte class 1 tablet  p.o. q.d.   Rectal exam during this hospitalization demonstrated no external lesion.  Digital exam showed prostate mild enlargement, smooth.  Stool was slightly  positive.  It should be noted that colonoscopy performed on March 07, 2002  revealed the following for hematochezia:  Numerous meniscal diverticula in  the cecum, the ascending colon, transverse colon, descending colon, and sigmoid colon.  The mucosa of the sigmoid  and rectum were minimal and  friable.  The mucosa in the sigmoid was edematous and erythematous.  Multiple random cold biopsies were obtained.  There were patchy involvement  in the rectum.  Multiple random cold biopsies were obtained.  Colonoscopy  otherwise were normal.  The biopsy will be checked again for any abnormality  pertaining to this procedure.  Problem 3:  CHRONIC ETHANOL USE:  The patient was advised to stop use of  ethanol.  He has remained alert and oriented to person and place throughout  this hospitalization.  Mood has always been cooperative.  He has not  demonstrated any sign of visual, tactile, or auditory hallucination.  Magnesium level on admission was within normal range.  He has demonstrated  no nausea, vomiting, or complaints of abdominal pain.  Urine drug screen was  negative for amphetamine, barbiturate, benzodiazepines, cocaine, and  sedatives. Urine drug screen was positive for opiates only.   Problem 4:  HISTORY OF HYPERTENSION:  Blood pressure on admission was  130/87, pulse was 137.  The patient was resumed on Inderal 10 mg p.o. q.8h.  Also, he was put on sodium prescription.  Chest x-ray was read as class size  of mediastinum 2 over normal.  Both lung fields were clear.  Chest x-ray  demonstrated no active disease.  Blood pressure has been controlled at the  end of this hospitalization.  Blood pressure on November 13, 2003 128/83 with a  pulse of 75.  The patient does not complain of chest pain, shortness of  breath, or palpitation.  It should also be noted that one of the concerns on  admission was a temperature of 103.3 in the emergency room.  This was  concern with his knee swelling up with possible septic joint.  However,  culture of fluid had turned out to be negative.  The patient could of had  early viral infection.   Other significant labs done during this hospitalization were as followed:  Sodium 135, potassium 3.8, chloride 103, CO2 23, glucose  107, BUN 15,  creatinine 1.3, calcium 9.1, magnesium 1.6.  Fasting lipid profile revealed  the following November 12, 2003, total cholesterol of 99, triglycerides 86  mg/dL, HDL 32 mg/dL, LDL 50 mg/dL.  Urinalysis revealed the following during  this hospitalization:  A pH of 5.5, specific gravity of 1.025, glucose  negative, trace hemoglobin, bilirubin negative, ketone negative, protein  trace, nitrite positive, leukocyte esterase moderate, WBC 11 to 20, bacteria  few.  Urine culture demonstrated contamination.  The patient was on p.o.  Levaquin at the time of admission as well as IV Ancef.  The patient has not  complained of dysuria, gross hematuria, back pain, fever, chills, or change  in urinary frequency.   Prostate specific antigen of 1.43 ng/m (normal 0.10 to 4.0).  Free prostatic  specific antigen reflex was 0.4 ng/m.  Acid phosphatase  (prostatic) was 1.5  ng/m.  The patient has not complained of urinary hesitancy, urinary strain, or nocturia.  The patient will be discharged to his home on November 14, 2002.   DIET:  No added salt.   ACTIVITY:  Ambulate with walker.   MEDICATIONS:  1. Hemocyte Plus 1 tablet p.o. q.d.  2. Pepcid 20 mg p.o. q.d.  3. Inderal 10 mg 1 tablet q.8h.  4. Tylenol 500 mg p.o. q.8h. p.r.n. pain.  5. Lortab 5 mg 1 tablet p.o. every 6 to 8 hours p.r.n. pain.   FOLLOW UP:  Follow up with Dr. Barrie Folk. Hill in one week.  Appointment with  Dr. [name], orthopedist, for evaluation of chronic left knee  effusion.   PRIMARY DIAGNOSES:  1. Chronic symptomatic left knee effusion.  2. Severe degenerative joint disease of left knee.   SECONDARY DIAGNOSES:  1. Chronic anemia with iron deficiency.  2. Hypertension.  3. Chronic ethanol use.  ___________________________________________                                         Barrie Folk. Berdine Addison, M.D.   Armanda Heritage  D:  11/13/2003  T:  11/14/2003  Job:  [id number]

## 2011-01-16 NOTE — Discharge Summary (Signed)
NAME:  [name], [name]                           ACCOUNT NO.:  [id number]   MEDICAL RECORD NO.:  [id number]                   PATIENT TYPE:  INP   LOCATION:  A312                                 FACILITY:  [facility]   PHYSICIAN:  Karlyn Agee, M.D.              DATE OF BIRTH:  06-08-1951   DATE OF ADMISSION:  04/26/2004  DATE OF DISCHARGE:  04/28/2004                                 DISCHARGE SUMMARY   PRIMARY CARE PHYSICIAN:  Dr. Berdine Addison.   DISCHARGE DIAGNOSES:  1. Acute pancreatitis, resolved.  2. History of ETOH abuse, patient denies.  3. B12 deficiency anemia.  4. Hypertension, controlled.  5. History of gastroesophageal reflux disease.  6. Trichomonas infection.   DISPOSITION:  Discharged to home.   DISCHARGE CONDITION:  Stable.   DISCHARGE MEDICATIONS:  1. Vitamin B12 1000 mcg IM weekly.  2. Multivitamins 1 tab daily.  3. Thiamin 100 mg daily.  4. Folic acid 1 mg daily.  5. K-Dur 40 mEq daily.  6. Aciphex 20 mg daily.  7. Lopressor 25 mg b.i.d.  8. Flagyl 250 mg t.i.d.   HOSPITAL COURSE:  Please refer to history and physical of August 27.  This  is a 60 year old African-American man with a history of chronic alcohol use,  who was admitted with a 12 hour history of epigastric pain and who had an  elevated amylase on laboratory investigations.  The patient was admitted on  a graduated diet, and his pain soon resolved.  This morning, the patient is  eating a full diet without difficulty; however, subsequent investigations  revealed that the patient's B12 level was remarkably low, and he has  microcytic anemia with an MCV of 108.  Due to technical difficulties,  Schillings tests are no longer being done, and blood was drawn for intrinsic  factor antibodies, and patient was given an injection of B12.  The patient  has been discharged with multivitamin support, Aciphex, and he is to follow  up with his primary care physician for weekly B12 and for follow-up visit of  his blood  work.   The patient was incidentally found to have Trichomonas in his urine and was  started on a 1 week course of Flagyl.  Today's labs, his white count is 5.5,  hemoglobin 9.2, hematocrit 26.7, MCV 108.1, platelets 251.  He has a normal  differential on his white count.  His sodium is 137, potassium 3.4, chloride  102, CO2 25, glucose 102, BUN 9, creatinine 1.1.  His liver function tests  were essentially normal with an albumin of 3.8.  His calcium was 8.1,  magnesium 1.7, his amylase 139.  It was 190 on admission.  His B12 level was  116.  His folate was 311.  His urinalysis was unremarkable.   FOLLOW UP:  Follow up with primary care physician, Dr. Berdine Addison, in 1 week.   SPECIAL INSTRUCTIONS:  He is to  avoid alcohol.     ___________________________________________                                         Karlyn Agee, M.D.   LC/MEDQ  D:  04/28/2004  T:  04/28/2004  Job:  [id number]

## 2011-01-16 NOTE — H&P (Signed)
NAME:  TYLO, KRASZEWSKI NO.:  [id number]   MEDICAL RECORD NO.:  [id number]          PATIENT TYPE:  AMB   LOCATION:  DAY                           FACILITY:  [facility]   PHYSICIAN:  Carole Civil, M.D.DATE OF BIRTH:  1950-12-10   DATE OF ADMISSION:  DATE OF DISCHARGE:  LH                                HISTORY & PHYSICAL   CHIEF COMPLAINT:  Pain, left knee.   HISTORY:  [name] is 60 years old.  He is status post removal of  intramedullary nail left tibia, which was placed for closed left tib-fib  fracture in October 2006.  His recent surgery was done June of 2007.  He is  recovered from that.  All wounds are healed.  During the treatment course of  his left tibia fracture, it was noted that he had severe osteoarthritis of  the left knee and would need knee replacement surgery.  He has been treated  for his osteoarthritis with oral anti-inflammatories and intra-articular  injections and continues to complain of pain.   I have explained the risks and benefits of the surgery to the patient  including no treatment with progressive deformity as a risk, as well as  progressive pain.  He has opted for surgical treatment.   The patient has a past history of pancreatitis, ethanol abuse, B-12  deficiency with anemia, hypertension, gastroesophageal reflux.  He had a  previous medial collateral ligament repair with staple fixation in the 78s.  The staples are still in place.  He has a history of previous  rhabdomyolysis.  He is status post cholecystectomy.  He has also had a  meniscectomy.   MEDICATIONS:  Include Metoprolol, a multivitamin, Protonix, thiamine and  potassium.   ALLERGIES:  HE HAS NO KNOWN ALLERGIES.   PHYSICAL EXAMINATION:  GENERAL APPEARANCE:  Patient is unkempt, but  development, nutrition, hygiene are acceptable.  Nutrition appears adequate.  HEAD AND NECK:  Supple, no trauma, no lymphadenopathy.  CHEST:  Clear.  CARDIOVASCULAR:  Pulses  peripherally are normal.  VITAL SIGNS:  Temperature is normal.  Mild edema, mild swelling.  GI:  Soft, nontender.  HEART:  Rate and rhythm normal.  MUSCULOSKELETAL:  Gait and station.  He walks with a cane, limps.  He has  valgus knee.  Inspection of the left knee shows tenderness on the medial  compartment with malalignment, crepitance on range of motion, which for him  has been maintained up to 125 degrees of flexion.  He has a small flexion  contracture.  The knee is stable including the medial collateral ligament.  He has normal strength and muscle tones.  SKIN:  Intact.  All incisions are healed. There are 2 incisions, one  medially for the medial collateral ligament repair and one small one over  the patellar tendon for the insertion of the nail, and the distal screw  incisions are healed.  We will use the patella incision for the surgery.  Coordination tests were poor for him.  I suspect it is from his chronic  alcohol abuse.  Reflexes are 1+ at  the knee, 2+ in the upper extremities.  Sensation was normal.  He was oriented to time, person and place, mood and  affect pleasant.  Radiographs were taken of his knee on the day of his pre-  op visit, showed he had a valgus knee with osteoarthritis.   DIAGNOSIS:  Osteoarthritis of the knee.   PLAN:  Left total knee.      Carole Civil, M.D.  Electronically Signed     SEH/MEDQ  D:  04/19/2006  T:  04/19/2006  Job:  [id number]

## 2011-01-16 NOTE — Op Note (Signed)
NAME:  [name], [name]                           ACCOUNT NO.:  [id number]   MEDICAL RECORD NO.:  [id number]                   PATIENT TYPE:  INP   LOCATION:  A330                                 FACILITY:  [facility]   PHYSICIAN:  J. Sanjuana Kava, M.D.              DATE OF BIRTH:  September 14, 1950   DATE OF PROCEDURE:  DATE OF DISCHARGE:                                 OPERATIVE REPORT   PREOPERATIVE DIAGNOSIS:  Comminuted fracture of the right distal radius  displaced.   POSTOPERATIVE DIAGNOSIS:  Comminuted fracture of the right distal radius  displaced.   PROCEDURE:  Open treatment internal fixation right distal radius with a  modified P shaped Synthes plate and screws.   ANESTHESIA:  General.   TOURNIQUET TIME:  45 minutes, no drain. A sugar tong splint applied at the  end of the procedure.   SURGEON:  J. Sanjuana Kava, M.D.   ASSISTANT:  Adolphus Birchwood, P.A.-C.   INDICATIONS FOR PROCEDURE:  The patient is a 60 year old male who fell and  sustained the above mentioned injuries to his right distal radius and a  comminuted fracture with shortening. Surgery was recommended, however, he  wanted to wait before he considered surgery. He thought about it. We talked  about shortening, possible problems in the future and after due  consideration, he has agreed to the procedure. The risks and imponderables  have been discussed in detail.   INDICATIONS FOR PHYSICIAN ASSISTANT:  Physician assistant is necessary in  this case,this is a rather difficult case. I am the only orthopedic surgeon  on the staff, we are a small community hospital. A physician assistant was  required to help me in the reduction, maintain the reduction, and placement  of screws and actually screwing in the screws. The fracture required to have  the reduction maintained and she obtained the reduction and maintained it  while I was putting in the initial screws. An assistant was mandatory for  that portion of the procedure.  Her assistance was medically required and  necessary.   DESCRIPTION OF PROCEDURE:  The patient placed supine on the operating room  table and after general anesthesia was obtained, the tourniquet and the hand  table were attached. The patient was prepped and draped in the usual manner.  An outline for an incision was made and arm was elevated and wrapped  circumferentially with Esmarch bandage, tourniquet inflated to 250 mmHg,  Esmarch bandage removed. First a modified carpal tunnel type incision was  made, volar carpal ligament was identified, the median nerve was identified.  Clips were placed around the median nerve proximally and with careful  dissection the volar carpal ligament was then incised. A specimen of volar  carpal ligament sent to pathology. Proximally the median nerve was intact  and then we deeper past the flexors down to the pronator. The pronator was  identified, freed from the bone  up the radial side and reflected ulnarly.  The fracture site was identified and the distal radius was identified and  further exposed using a periosteal elevator. The fracture was reduced and  was markedly shortened but would not stay in place, was very comminuted and  by keeping a freer and putting some pressure, we were able to maintain our  reduction. An assistant was required to do this. We picked several plates  out and we found one with a modified lazy T was selected. The plate was  placed in position, a C-arm fluoroscopy unit was used and good position was  set, a drill hole was made and tapped and then a second drill hole was made  and tapped. The screws then distally were placed and measured 30 mm. The  screws only come in 28 and 32 mm. I placed two 28s and then one 32 screw.  Three holes were done. This gave good fixation to the fracture distally and  the remaining screws were placed proximally except for the most proximal one  I left that one without a screw. Permanent pictures  were taken which showed  reduction of the fracture and good position and placement of the screws. The  pronator was reapproximated using 2-0 chromic. The median nerve was  reidentified and reexamined with no apparent injury, thus clip removed. The  plain suture was moved to close the subcutaneous tissue, no repair was made  of the fascial plane. Skin staples were used to close the skin line.  Tourniquet deflated after 45 minutes. There were no drains. Hemostasis  obtained, a dressing was applied and then a sugar tong splint applied. The  patient tolerated the procedure well and went to recovery in good condition.  He was admitted.                                               [name], M.D.    JWK/MEDQ  D:  05/18/2002  T:  05/19/2002  Job:  [id number]

## 2011-01-16 NOTE — Group Therapy Note (Signed)
NAME:  KILEY, OVERDORF NO.:  [id number]   MEDICAL RECORD NO.:  [id number]          PATIENT TYPE:  INP   LOCATION:  [id number]                          FACILITY:  [facility]   PHYSICIAN:  Carole Civil, M.D.DATE OF BIRTH:  08-29-1951   DATE OF PROCEDURE:  DATE OF DISCHARGE:                                   PROGRESS NOTE   He is status post a left total knee on June 29, 2006.  He is doing well.  Has a low-grade temperature.  His fever workup has been negative.  His wound  looks fine.  There is no further drainage.  His calf is soft, nontender.  Leg is not swollen except for the knee itself and that is mild.  His pulse  rate has been in the high 90s, low 100.  Respiratory rate 18-20, blood  pressure now in the 120/71.  He is having his blood pressure medicines held  right now.  His metoprolol, however, is being continued.  He had his fourth  units of blood yesterday and his hemoglobin today is now 9.8.  He is on  Coumadin instead of Lovenox.  His INR is 2.4.  We will start a 7.5/5 mg  alternating dose Coumadin schedule and continue to take daily pro times.  He  could not get a bed today at [facility] and he will have to wait until Monday;  so we will continue to watch him here.      Carole Civil, M.D.  Electronically Signed     SEH/MEDQ  D:  07/03/2006  T:  07/03/2006  Job:  [id number]

## 2011-01-16 NOTE — Discharge Summary (Signed)
NAME:  [name], [name] NO.:  [id number]   MEDICAL RECORD NO.:  [id number]          PATIENT TYPE:  IPS   LOCATION:  4006                         FACILITY:  [facility]   PHYSICIAN:  Jarvis Morgan, M.D.   DATE OF BIRTH:  04-Jun-1951   DATE OF ADMISSION:  06/24/2004  DATE OF DISCHARGE:  07/09/2004                                 DISCHARGE SUMMARY   DISCHARGE DIAGNOSES:  1.  Left tibial fracture with IM nailing.  2.  History of cellulitis, left lower extremity, resolved.  3.  History of ETOH abuse.  4.  Hypertension.  5.  Hypernatremia, stable.   HISTORY OF PRESENT ILLNESS:  [name] is a 60 year old man with a history  of anemia, alcohol abuse, fall June 16, 2004 sustaining a distal left tib-  fib fracture.  He was cleared for surgery by internal medicine and underwent  IM nail fixation, left tibia, by Dr. Aline Brochure, on June 19, 2004.  Post-op  is non-weightbearing left lower extremity and on subcu Lovenox for DVT  prophylaxis.  He was noted to have post-op fevers as well as moderate edema  left lower extremity and question of UTI causing fevers and the patient was  started on Bactrim today.   PAST MEDICAL HISTORY:  1.  Anemia, iron deficiency.  2.  DJD left knee with effusion, requiring a tap in 2005.  3.  History of open meniscectomy.  4.  History of ETOH abuse.  5.  Gastritis.  6.  Hepatitis in 1996.  7.  Recent pancreatitis, September 2005.  8.  History of lap chole.  9.  Hematochezia, 1996, 1998.  10. B-12 deficiency.  11. History of gonorrhea.  12. History of rhabdomyolysis.   ALLERGIES:  No known drug allergies.   SOCIAL HISTORY:  The patient lives alone in a one-level home.  Was  independent prior to admission.  He lives in a one-level home with one step  at entry.  He does not use any tobacco.  Drinks alcohol.  Denies alcohol  use, however, reports having had two beers on day of admission to [facility]:  [name] was admitted to rehab, on June 24, 2004, for inpatient therapies to consist of PT, OT daily.  At the time of  admission, the patient was noted to continue with post-op anemia with a  hemoglobin at 8.6, hematocrit 25.1.  This was followed and on labs of  June 27, 2004, it is stable with a hemoglobin at 8.8, hematocrit 25.9.  The patient was on Librium at admission.  This was slowly tapered off during  his stay.  He was noted to have edema with early cellulitis left lower  extremity and was started on Keflex for wound prophylaxis.  This was  maintained to June 27, 2004.  The patient was noted to have fevers of  unknown origin.  A chest x-ray done showed mild right lower lobe infiltrate  with atelectasis.  Blood cultures x 2 have been negative.  The patient was  treated with Cipro 250 mg  b.i.d. x 5 days.  The patient has had problems  with pain control.  Initially OxyContin was started, however, the patient  was noted to have problems with sedation and confusion on this.  This was  discontinued.  The patient continues to have problems with muscle spasms  with Skelaxin being used on a p.r.n. basis.  The patient's 2 to 3+ edema of  the left lower extremity  is much improved with the use of Ace wrap.  TEDs  are being used currently with elevation to help with edema control.  The  patient was maintained on subcu Lovenox throughout his stay due to his  immobility.  Staples discontinued and Steri-Strips placed on post-op day 15  without difficulty.  The patient's progress in therapies continues to be  limited secondary to weightbearing status as well as poor endurance.  Currently, the patient needs a setup assist for upper body care, close  supervision for lower body care, close supervision for toileting.  He  continues to show poor initiation, and decreased ability to follow commands,  and decreased responses.  The patient is currently at min assist for  transfers, min to mod assist  for ambulating 100 feet with a rolling walker.  Further followup therapies to continue at nursing home level past discharge.  His family is unable to provide assistance as needed.  The patient to be  discharged to [facility] on July 09, 2004.   MEDICATIONS:  1.  Multivitamin with iron one p.o. per day.  2.  Protonix 40 mg a day.  3.  Folic acid 1 mg a day.  4.  Lasix 20 mg a day.  5.  Vitamin B-1 100 mg a day.  6.  Lopressor 25 mg b.i.d.  7.  Oxycodone IR 5-10 mg q.4-6h. p.r.n. pain.  8.  Skelaxin 400 mg q.i.d. p.r.n. spasm.   ACTIVITY:  No weightbearing left lower extremity with the use of walker, 24  hour supervision.   DIET:  Regular.   WOUND CARE:  Keep area clean and dry.  Wash with soap and water.  Keep left  lower extremity elevated when in bed or in chair.  Thigh high TEDs for edema  control.  Progressive PT OT to continue past discharge.   FOLLOW UP:  1.  The patient is to follow up with Dr. Aline Brochure in the next two weeks for      postoperative check and for      advancement of weightbearing status.  2.  Follow up with Dr. Berdine Addison for routine care.  3.  Follow up with Dr. Jonette Eva as needed.       PP/MEDQ  D:  07/08/2004  T:  07/08/2004  Job:  [id number]   cc:   Barrie Folk. Berdine Addison, M.D.  [address]  [place]  [place] [address]  Fax: [phone]   Carole Civil, M.D.  Fax: [phone]

## 2011-01-16 NOTE — Op Note (Signed)
   NAME:  [name], [name]                           ACCOUNT NO.:  [id number]   MEDICAL RECORD NO.:  [id number]                   PATIENT TYPE:  OBV   LOCATION:  A325                                 FACILITY:  [facility]   PHYSICIAN:  Vernon Prey. Tamala Julian, M.D.                DATE OF BIRTH:  [birth year]   DATE OF PROCEDURE:  DATE OF DISCHARGE:                                 OPERATIVE REPORT   PREOPERATIVE DIAGNOSES:  Chronic cholecystitis.   POSTOPERATIVE DIAGNOSES:  Chronic cholecystitis.   PROCEDURE:  Laparoscopic cholecystectomy.   SURGEON:  Vernon Prey. Tamala Julian, M.D.   DESCRIPTION OF PROCEDURE:  Under general anesthesia, the patient's abdomen  was prepped and draped in a sterile fashion.  A supraumbilical incision was  made, and a Veress needle was placed uneventfully.  Position was confirmed  by negative __________, as well as saline drop test.  The abdomen was  insufflated to 16 cm of pressure.  A 10 mm port was placed using a  _________.  The laparoscope was placed, and the gallbladder was visualized.  Standard right upper quadrant incisions were placed under videoscopic  guidance with a 10 mm port and two 5 mm ports.  The gallbladder was grasped.  The cystic artery was dissected, clipped with four clips, and divided. The  cystic duct was dissected, clipped with clips and divided.  There was a  posterior ________in a similar manner.  Using electrocautery, the  gallbladder was separated from the infrahepatic bed without difficulty.  It  was retrieved intact.  Irrigation was carried out.  There was no evidence of  bile leak or bleeding.  Inspection of the liver laparoscopically revealed  that it appeared remarkably healthy, despite his long history of alcohol  abuse.  No other abnormality was found.  CO2 was allowed to escape from the  abdomen, and the ports were removed.  The fascia over the lower three ports  was closed with 0 Dexon.  The skin was closed with staples.  Dressings were  placed.   The patient was awakened from anesthesia uneventfully and was  transferred to the bed and taken to the postanesthetic care unit.                                               Vernon Prey. Tamala Julian, M.D.    LCS/MEDQ  D:  06/27/2002  T:  06/27/2002  Job:  [id number]

## 2011-01-16 NOTE — Procedures (Signed)
[facility]  Patient:    [name], [name] Visit Number: [id number] MRN: [id number]          Service Type: MED Location: 2A A211 01 Attending Physician:  Rexene Alberts Dictated by:   Sinda Du, M.D. Proc. Date: 03/05/02 Admit Date:  03/05/2002 Discharge Date: 03/08/2002                            EKG Interpretations  The rhythm is a sinus rhythm with a rate in the 80s.  Normal EKG. Dictated by:   Sinda Du, M.D. Attending Physician:  Rexene Alberts DD:  03/06/02 TD:  03/08/02 Job: [id number]

## 2011-01-16 NOTE — H&P (Signed)
NAME:  [name], [name] NO.:  [id number]   MEDICAL RECORD NO.:  [id number]          PATIENT TYPE:  AMB   LOCATION:  DAY                           FACILITY:  [facility]   PHYSICIAN:  Carole Civil, M.D.DATE OF BIRTH:  [birth year]   DATE OF ADMISSION:  DATE OF DISCHARGE:  LH                                HISTORY & PHYSICAL   CHIEF COMPLAINT:  Pain left knee.   HISTORY OF PRESENT ILLNESS:  [name] is status post an intramedullary  nailing of his left tibia for closed left tibia-fibula fracture.  That  surgery was done June 20, 2005.  He tolerated that well and over the last  two years has had increasing left knee pain which turns out to be  osteoarthritis and he will require knee replacement surgery.  He has had  multiple injections in the knee and this has not relieved his symptoms.   PAST MEDICAL HISTORY:  The patient has a history of pancreatitis, ethanol  abuse, B12 deficiency with anemia and has hypertension, gastroesophageal  reflux.  His left knee was also operated on for medial collateral ligament  repair with staple fixation.  He has a history of chronic ethanal abuse,  rhabdomyolysis. He had a cholecystectomy.  He has had a meniscectomy   MEDICATIONS:  Most recent update that I have says that he takes metoprolol,  a multivitamin, Protonix, thiamine and potassium.   ALLERGIES:  None known.   INFORMED CONSENT:  He has given informed consent to have the nail taken out,  understanding that the nail could break, he could re-fracture the tibia  after the nail is removed if he does not follow strict postoperative  precautions.   PHYSICAL EXAMINATION:  GENERAL APPEARANCE:  The patient is somewhat unkempt  but development is normal.  Grooming and hygiene are fair. Nutritional  status appears adequate.  CARDIOVASCULAR:  Benign including normal pulse and perfusion.  There is mild  peripheral edema, no tenderness.  LYMPH NODES:  Cervical region normal.  NEUROLOGICAL:  There was normal mental status. He was awake and alert.  He  was oriented to person and place.  He had normal sensation.  Reflexes were  2+.  Coordination was normal.  MUSCULOSKELETAL:  Upper extremity alignment shows normal range of motion,  strength and stability.  Right lower extremity the same.  Left lower  extremity has an incision on the medial side of his knee.  There is also one  for the tibial nail which is more lateral to the patellar tendon.  There is  tenderness over the medial and lateral aspects of the knee.  He has valgus  malalignment.  This is notable at the knee joint, not in the tibia itself.  He has range of motion of 5 to 120.  There is no collateral ligament  instability or AP instability.  Gross muscle strength of the limb is normal.   IMPRESSION:  1.  Retained tibial hardware from previous fractured tibia.  2.  Osteoarthritis of the knee.   PLAN:  Remove tibial nail, left knee.  Carole Civil, M.D.  Electronically Signed     SEH/MEDQ  D:  02/15/2006  T:  02/15/2006  Job:  [id number]   cc:   [facility]  Fax: [phone]

## 2011-01-16 NOTE — Op Note (Signed)
NAME:  [name], [name] NO.:  [id number]   MEDICAL RECORD NO.:  [id number]          PATIENT TYPE:  INP   LOCATION:  [id number]                          FACILITY:  [facility]   PHYSICIAN:  Carole Civil, M.D.DATE OF BIRTH:  05/07/1951   DATE OF PROCEDURE:  08/06/2006  DATE OF DISCHARGE:                               OPERATIVE REPORT   PREOPERATIVE DIAGNOSIS:  Septic arthritis.   POSTOPERATIVE DIAGNOSIS:  Hemarthrosis in the left knee.   PROCEDURE:  Irrigation, debridement and culture, manipulation under  anesthesia, left knee.   SURGEON:  Carole Civil, M.D.   ANESTHESIA:  Spinal.   OPERATIVE FINDINGS:  There was a large hematoma in the knee.  There were  no signs of sepsis.  Manipulation was performed, range of motion  preoperatively 0-45, postop 0-105.   DETAILS OF PROCEDURE:  [name] was identified in the preop holding  area as [name], left knee marked as the surgical site,  countersigned by the surgeon, history and physical updated.  No  antibiotics were started because we were taking cultures.   The patient was taken to the operating room for spinal anesthetic,  placed supine.  The left knee was draped using sterile technique.  After  confirming the procedure, doing the time-out, we elevated the  tourniquet, exsanguinating the limb, increasing the pressure to 300  mmHg.  We used the previous total knee incision, divided the skin  through the subcu tissue to the extensor mechanism, performed medial  arthrotomy, everted the patella, irrigated the knee, took cultures, took  care to debride the knee, release the suprapatellar pouch, and then  perform manipulation up to 105 degrees of flexion.  There was no  loosening seen, no purulent material was seen.  We then closed the knee  over a suction drain with #1 Bralon and 0 Monocryl.  We applied staples,  sterile dressing.  Cryo/Cuff was not applied on this patient.  The  tourniquet was released.   The patient was taken to the recovery room in  stable condition.   POSTOP PLAN:  Review cultures, start vancomycin, start therapy.  The  patient was placed in CPM 0-90 immediately.  We will follow closely.      Carole Civil, M.D.  Electronically Signed    SEH/MEDQ  D:  08/09/2006  T:  08/09/2006  Job:  [id number]

## 2011-01-16 NOTE — Discharge Summary (Signed)
NAME:  [name], [name]                           ACCOUNT NO.:  [id number]   MEDICAL RECORD NO.:  [id number]                   PATIENT TYPE:  INP   LOCATION:  A207                                 FACILITY:  [facility]   PHYSICIAN:  Norwood Levo. Moshe Cipro, M.D.           DATE OF BIRTH:  03/09/1951   DATE OF ADMISSION:  06/21/2002  DATE OF DISCHARGE:  06/23/2002                                 DISCHARGE SUMMARY   DISCHARGE DIAGNOSIS:  1. Recurrent emesis secondary to gallbladder disease.  2. History of hypertension; presented with severe hypertension which has     corrected at discharge.  3. Hypokalemia.  4. History of alcohol abuse.  5. Mild mental retardation.   SUMMARY:  The patient is a 60 year old African-American male who presented  with a two-day history of emesis.  He reported that everything he took in by  mouth he vomited - both solids and liquids - and reported about 10 episodes  of emesis.  He denied any diarrhea.  He denied sinus pressure, drainage, or  a sore throat or cough.  He did complain of mild frontal headache and he  reported chills.  On arrival to the emergency room his temperature was 101.4  with a blood pressure of 131/119.  In the emergency room he was given  labetalol 20 mg IV x1 dose and since then during this hospitalization he has  required no antihypertensive medication and has remained normotensive.   PAST MEDICAL HISTORY:  1. Hypertension.  2. Alcohol abuse.  3. Mild mental retardation.  4. Hospitalized for two days on Dec 30, 2000 for a urinary tract infection     and alcohol intoxication.   SURGICAL HISTORY:  1. Status post left knee surgery secondary to trauma in 2003.  2. He is status post right arm surgery, also secondary to trauma.   MEDICATIONS:  Medications recorded that he takes:  1. Inderal 10 mg twice daily.  2. Clonidine 0.1 mg daily.  3. Tylox 5/500 every four hours as needed for pain.   PHYSICAL EXAMINATION:  GENERAL:  At the time I saw  him he was alert and  oriented x4.  He had mild frontal headache and no cardiopulmonary distress.  VITAL SIGNS:  At that time his blood pressure was 152/88, respirations 20,  heart rate 102, and temperature 98.3.  HEENT:  Extraocular muscles intact.  Oropharynx moist.  NECK:  Supple.  No adenopathy, no JVD, no bruit.  CHEST:  Clear to auscultation bilaterally.  No crackles or wheezes heard.  CARDIOVASCULAR:  Heart sounds 1 and 2.  No murmurs, no S3.  ABDOMEN:  Showed some guarding with tenderness in the right upper quadrant.  He also had left lower quadrant tenderness; however, no guarding present  there.  His bowel sounds were normal.  RECTAL:  Not done.  EXTREMITIES:  Negative for edema and was positive for a surgical scar on the  left knee and a cast on his right arm.   LABORATORY DATA ON ADMISSION:  Sodium 131, potassium 3.5, chloride 98, CO2  23, BUN 11, creatinine 1.1, glucose 124.  Hemoglobin 15.1, white blood cell  count 5.3, platelets 217.  Lipase 25 and amylase mildly elevated at 33.  Hepatic panel showed an increased total bilirubin of 2.0 with an indirect  bilirubin elevated at 1.7.  His SGOT was elevated at 42 and total protein  elevated at 9.2.   HOSPITAL COURSE:  The patient was admitted to concentrated care and  telemetry because of his elevated blood pressure as well as heart rate on  admission.  As stated before, he required no further antihypertensive  medication while in hospital and remained essentially normotensive.  1. Gastrointestinal system.  The patient had an ultrasound of his     gallbladder done followed by a HIDA scan which showed abnormal     gallbladder function with an ejection fraction of 20%.  He was confirmed     as having gallbladder disease.  Dr. Tamala Julian was consulted for surgery and     he will be readmitted on June 26, 2002 for an elective     cholecystectomy.  At the time of his discharge he is tolerating a regular     diet with no further  emesis.  2. Central nervous system.  The patient complained of headache but there     were no focal neurologic deficits.  A CT scan of his head showed no     intracranial hemorrhage or edema but inflammation of the ethmoid sinus.  3. Cardiopulmonary system remained stable throughout his hospitalization, as     did his GU system.   DISPOSITION:  The patient's condition at the time of his discharge is stable  and improved.  He is mildly hypokalemic and is being discharged home on  potassium at 20 mEq twice daily.  Catapres-TTS-1 patch will be placed on him  prior to his discharge to be changed weekly.  He is to return to day surgery  on Tuesday morning at 12:30 for elective cholecystectomy.  He is to be nil  oral from midnight Monday.                                               Norwood Levo. Moshe Cipro, M.D.    MES/MEDQ  D:  06/23/2002  T:  06/23/2002  Job:  [id number]

## 2011-01-16 NOTE — Op Note (Signed)
NAME:  [name], [name] NO.:  [id number]   MEDICAL RECORD NO.:  [id number]          PATIENT TYPE:  INP   LOCATION:  [id number]                          FACILITY:  [facility]   PHYSICIAN:  Carole Civil, M.D.DATE OF BIRTH:  05/17/1951   DATE OF PROCEDURE:  DATE OF DISCHARGE:                                 OPERATIVE REPORT   PREOPERATIVE DIAGNOSIS:  Closed left tibia-fibula fracture.   POSTOPERATIVE DIAGNOSIS:  Closed left tibia-fibula fracture.   PROCEDURE:  Closed intramedullary nailing, left tibia-fibula fracture.   SURGEON:  Carole Civil, M.D.   ANESTHESIA:  Spinal.   ESTIMATED BLOOD LOSS:  250 mL.   TOURNIQUET TIME:  43 minutes at 300 mmHg.   INDICATION FOR PROCEDURE:  Displaced left tibia fracture.   COMPLICATIONS:  None.   Sponge and needle counts were correct.   BRIEF HISTORY:  [name] is 60 years old.  He fractured his left tib-fib  getting out of bed.  He was intoxicated.  He was brought into the hospital,  placed in a posterior splint.  His alcohol levels were allowed to normalize.  Preop he was medically stabilized and then came to surgery.   The procedure was performed as follows:  [name] was identified in the  preop holding area.  His left leg was marked as the operative site.  My  initials were placed over the operative site.  He was given Ancef and taken  to the operating room for spinal anesthesia.  After sterile prep and drape,  the mandatory time-out was taken to confirm the procedure, extremity, and  patient's identity.  Everyone on the surgical team agreed, and antibiotics  were confirmed to be given one hour or less prior to skin incision.  All  implants were confirmed to be in place and ready for use.   The tourniquet was elevated and incision was made over the patellar tendon.  The patellar tendon was split.  The proximal joint line was identified. The  medial aspect of the lateral tibial spine was identified and a  curved awl  was placed into the tibial canal.  A guidewire was passed across the  fracture site with the fracture reduced and confirmed with x-ray.  Serial  reamings were progressed up to 13.5.  The nail length was measured to be  390.  A 390 mm x 12 mm nail was passed over the guidewire, the guidewire was  removed.  Radiographs confirmed position of the nail and reduction of the  fracture.  An end cap was placed. A proximal dynamic screw was placed using  a 3.5 drill bit, small skin incision, and depth gauge.  The screw was  passed, confirmed by x-ray on AP and lateral views, and that wound was  closed in layered fashion after irrigation with 0 and 2-0 Vicryl and  staples.   The leg was then placed on a bump.  The proximal-distal locking screw was  identified, and distal locking screw was placed with the same technique as  the proximal screw.   AP and lateral x-rays confirmed the  position of the screw.  Final x-rays of  the fracture site showed the fracture site to be reduced within acceptable  limits and the distal wound was closed with staples.   Sterile dressings were applied.  The patient was taken to the recovery room  in stable condition.   Of note, after the proximal wound was closed, the tourniquet was released.     Cherlynn Kaiser   SEH/MEDQ  D:  06/20/2004  T:  06/20/2004  Job:  [id number]

## 2011-01-16 NOTE — Consult Note (Signed)
NAME:  [name], [name]                           ACCOUNT NO.:  [id number]   MEDICAL RECORD NO.:  [id number]                   PATIENT TYPE:  INP   LOCATION:  A330                                 FACILITY:  [facility]   PHYSICIAN:  J. Sanjuana Kava, M.D.              DATE OF BIRTH:  [birth year]   DATE OF CONSULTATION:  DATE OF DISCHARGE:                                   CONSULTATION   The patient has had significant pain and swelling of his left knee for  several days. He is well known to me. He is status post arthroscopy and  other multiple joint surgeries. He also has a history of gout. I have seen  him in the office recently for gout. He was admitted by Dr. Berdine Addison over the  weekend. I was asked to see him in relation to his left knee.   The knee is markedly enlarged and has a large effusion and decreased range  of motion. He is able to put some weight on it. He said he was in the  bathroom, and he walked from the bathroom to the bed. He is stable.   IMPRESSION:  1. Large effusion, left knee.  2. History of gout.   I have aspirated the left knee of 120 cc of joint fluid without any apparent  blood. The patient felt immediately better.   The patient has been allopurinol. Continue his allopurinol 300 mg a day. See  him in the office next week. _____________ For difficulties, contact me  through the office.      ___________________________________________                                            [name], M.D.   JWK/MEDQ  D:  11/13/2003  T:  11/14/2003  Job:  [id number]

## 2011-01-16 NOTE — Group Therapy Note (Signed)
NAME:  HOBY, FOLLEN NO.:  [id number]   MEDICAL RECORD NO.:  [id number]          PATIENT TYPE:  INP   LOCATION:  [id number]                          FACILITY:  [facility]   PHYSICIAN:  Carole Civil, M.D.DATE OF BIRTH:  [birth year]   DATE OF PROCEDURE:  DATE OF DISCHARGE:                                   PROGRESS NOTE   Status post left total knee replacement on the left.  He had a significant  amount of oozing from his wound last night.  It was reinforced.  He was  given 1 unit of blood.  His a.m. hemoglobin is 10.6.  His second unit is  added today.  We changed his dressing.  There is a one area in the middle of  the wound where the blood seems to be leaking from.  It appears to be a  subcutaneous hematoma.  We will observe this with hematocrits, hemoglobin,  reinforce the dressing as needed.   He also had low blood pressure.  This was treated with Trendelenburg  positioning.  At that point, we did at the unit of blood last night, and  this morning we are adding some saline, as we had some saline as a bolus.   We will hold his morning blood pressure, he can go ahead and start his  physical therapy.   We will also obtain an AP lateral of his tibia to reassess the overall  alignment of his tibial tray, which appeared to be in varus position  yesterday.   This will give [place] a better idea of the overall alignment, in relation to the  prosthetic tray, as relates to his standing position.   Otherwise, he is stable, and we will continue a post-op total knee protocol.      Carole Civil, M.D.  Electronically Signed     SEH/MEDQ  D:  06/30/2006  T:  06/30/2006  Job:  [id number]

## 2011-03-16 ENCOUNTER — Other Ambulatory Visit: Payer: Self-pay | Admitting: *Deleted

## 2011-03-16 DIAGNOSIS — M25569 Pain in unspecified knee: Secondary | ICD-10-CM

## 2011-03-16 MED ORDER — HYDROCODONE-ACETAMINOPHEN 5-500 MG PO TABS: 1.0000 | ORAL_TABLET | ORAL | Status: DC | PRN

## 2011-04-06 ENCOUNTER — Other Ambulatory Visit: Payer: Self-pay | Admitting: Gastroenterology

## 2011-04-06 NOTE — Telephone Encounter (Signed)
Need recent CBC from PCP on pt.

## 2011-04-09 NOTE — Telephone Encounter (Signed)
Called Dr. Everette Rank office an they dont have a recent CBC on this patient

## 2011-04-09 NOTE — Telephone Encounter (Signed)
Please get cbc from pcp. Thank you

## 2011-05-05 ENCOUNTER — Other Ambulatory Visit: Payer: Self-pay

## 2011-05-05 DIAGNOSIS — D509 Iron deficiency anemia, unspecified: Secondary | ICD-10-CM

## 2011-05-05 NOTE — Telephone Encounter (Signed)
LMOM for pt to call. Lab order faxed to [facility].

## 2011-05-05 NOTE — Telephone Encounter (Signed)
Pt needs CBC re:IDA

## 2011-05-06 NOTE — Telephone Encounter (Signed)
Pt informed he needs to get CBC and lab order has been faxed to [facility].

## 2011-05-16 ENCOUNTER — Encounter (HOSPITAL_COMMUNITY): Payer: Self-pay | Admitting: *Deleted

## 2011-05-16 ENCOUNTER — Emergency Department (HOSPITAL_COMMUNITY)
Admission: EM | Admit: 2011-05-16 | Discharge: 2011-05-16 | Disposition: A | Discharge status: Home / Self Care | Payer: Medicare Other | Attending: Emergency Medicine | Admitting: Emergency Medicine

## 2011-05-16 ENCOUNTER — Emergency Department (HOSPITAL_COMMUNITY): Payer: Medicare Other

## 2011-05-16 DIAGNOSIS — Z96659 Presence of unspecified artificial knee joint: Secondary | ICD-10-CM | POA: Insufficient documentation

## 2011-05-16 DIAGNOSIS — Z888 Allergy status to other drugs, medicaments and biological substances status: Secondary | ICD-10-CM | POA: Insufficient documentation

## 2011-05-16 DIAGNOSIS — M659 Synovitis and tenosynovitis, unspecified: Secondary | ICD-10-CM | POA: Insufficient documentation

## 2011-05-16 DIAGNOSIS — D649 Anemia, unspecified: Secondary | ICD-10-CM | POA: Insufficient documentation

## 2011-05-16 DIAGNOSIS — M65979 Unspecified synovitis and tenosynovitis, unspecified ankle and foot: Secondary | ICD-10-CM | POA: Insufficient documentation

## 2011-05-16 DIAGNOSIS — M779 Enthesopathy, unspecified: Secondary | ICD-10-CM

## 2011-05-16 DIAGNOSIS — I10 Essential (primary) hypertension: Secondary | ICD-10-CM | POA: Insufficient documentation

## 2011-05-16 LAB — POCT I-STAT TROPONIN I

## 2011-05-16 MED ORDER — OXYCODONE-ACETAMINOPHEN 5-325 MG PO TABS: 2.0000 | ORAL_TABLET | ORAL | Status: AC | PRN

## 2011-05-16 MED ORDER — OXYCODONE-ACETAMINOPHEN 5-325 MG PO TABS: 1.0000 | ORAL_TABLET | Freq: Four times a day (QID) | ORAL | Status: AC | PRN

## 2011-05-16 NOTE — ED Provider Notes (Signed)
Scribed for Maudry Diego, MD, the patient was seen in room APA15/APA15. This chart was scribed by [employer]. The patient's care started at 07:50  CSN: [id number] Arrival date & time: 05/16/2011  7:42 AM   Chief Complaint  Patient presents with  . Foot Pain    pt states feels like gout pain    HPI [name] is a 60 y.o. male with a history of gout, who presents to the Emergency Department complaining of right foot pain. Denies any trauma to his foot, or calf pain. Patient ambulates with a walking stick. Pain with plantar flexion. Per nurse, patient states pain feels like a history of gout pain. No swelling noted  Past Medical History  Diagnosis Date  . Diverticulosis 2008 LGIB  . Gout   . HTN (hypertension)   . History of septic arthritis   . History of alcohol abuse   . Anemia FeDA: GASTRIC POLYPS, B12    TCS 2008 EGD 2009, 2008-HB 11.1 MCV 83.6 CR 1.22, 2009 FERRITIN 102-22  . Hepatomegaly 2o to FATTY LIVER DZ   MEDICATIONS:  Previous Medications   AMLODIPINE (NORVASC) 10 MG TABLET    Take 1 tablet (10 mg total) by mouth daily.   FEBUXOSTAT (ULORIC) 40 MG TABLET    Take 80 mg by mouth daily.     FOLIC ACID (FOLVITE) 1 MG TABLET    Take 1 mg by mouth daily.     HYDROCODONE-ACETAMINOPHEN (VICODIN) 5-500 MG PER TABLET    Take 1 tablet by mouth every 4 (four) hours as needed for pain.   LEVOTHYROXINE (SYNTHROID, LEVOTHROID) 75 MCG TABLET    Take 75 mcg by mouth daily.     METOPROLOL SUCCINATE (TOPROL-XL) 25 MG 24 HR TABLET    Take 25 mg by mouth daily.     OMEPRAZOLE (PRILOSEC) 20 MG CAPSULE    Take 20 mg by mouth daily.     POLY-IRON 150 150 MG CAPSULE    TAKE (1) CAPSULE BY MOUTHTWICE DAILY.     ALLERGIES:  Allergies as of 05/16/2011 - Review Complete 05/16/2011  Allergen Reaction Noted  . Aspirin  06/01/2007      Past Surgical History  Procedure Date  . Upper gastrointestinal endoscopy [facility] 2009    INFLAMED HYPERPLASTIC POLYPS, CHRONIC GASTRITIS  . Colonoscopy  2008 [facility] DJ    LGIB 2o to TICS, prep good  . Replacement total knee bilateral   . Hand surgery     Family History  Problem Relation Age of Onset  . Colon polyps Neg Hx   . Colon cancer Neg Hx     History  Substance Use Topics  . Smoking status: Never Smoker   . Smokeless tobacco: Not on file  . Alcohol Use: Yes     one drink a week      Review of Systems  Constitutional: Negative for fatigue.  HENT: Negative for congestion, sinus pressure and ear discharge.   Eyes: Negative for discharge.  Respiratory: Negative for cough.   Cardiovascular: Negative for chest pain.  Gastrointestinal: Negative for abdominal pain and diarrhea.  Genitourinary: Negative for frequency and hematuria.  Musculoskeletal: Negative for back pain.  Skin: Negative for rash.  Neurological: Negative for seizures and headaches.  Hematological: Negative.   Psychiatric/Behavioral: Negative for hallucinations.  All other systems reviewed and are negative.    Allergies  Aspirin  Home Medications   Current Outpatient Rx  Name Route Sig Dispense Refill  . AMLODIPINE BESYLATE 10 MG PO  TABS Oral Take 1 tablet (10 mg total) by mouth daily. 30 tablet 11  . FEBUXOSTAT 40 MG PO TABS Oral Take 80 mg by mouth daily.      Marland Kitchen FOLIC ACID 1 MG PO TABS Oral Take 1 mg by mouth daily.      Marland Kitchen HYDROCODONE-ACETAMINOPHEN 5-500 MG PO TABS Oral Take 1 tablet by mouth every 4 (four) hours as needed for pain. 42 tablet 3  . LEVOTHYROXINE SODIUM 75 MCG PO TABS Oral Take 75 mcg by mouth daily.      Marland Kitchen METOPROLOL SUCCINATE 25 MG PO TB24 Oral Take 25 mg by mouth daily.      Marland Kitchen OMEPRAZOLE 20 MG PO CPDR Oral Take 20 mg by mouth daily.      Marland Kitchen POLY-IRON 150 150 MG PO CAPS  TAKE (1) CAPSULE BY MOUTHTWICE DAILY. 62 each 1    Dispense as written.    Physical Exam    BP 120/88  Pulse 108  Temp(Src) 98.7 F (37.1 C) (Oral)  Resp 16  Ht 6\' 6"  (1.981 m)  Wt 240 lb (108.863 kg)  BMI 27.73 kg/m2  SpO2 100%  Physical Exam    Nursing note and vitals reviewed. Constitutional: He is oriented to person, place, and time. He appears well-developed and well-nourished. No distress.  HENT:  Head: Normocephalic and atraumatic.  Mouth/Throat: Oropharynx is clear and moist.  Eyes: Conjunctivae are normal. Pupils are equal, round, and reactive to light.  Neck: Normal range of motion. No tracheal deviation present.  Cardiovascular: Normal rate and normal heart sounds.   Pulmonary/Chest: Effort normal and breath sounds normal. No respiratory distress.  Abdominal: Soft. Bowel sounds are normal. He exhibits no distension. There is no tenderness. There is no rebound.  Musculoskeletal: Normal range of motion.       Feet:  Neurological: He is alert and oriented to person, place, and time. No cranial nerve deficit.  Skin: Skin is warm and dry. No rash noted. No erythema.  Psychiatric: He has a normal mood and affect.    ED Course  Procedures  OTHER DATA REVIEWED: Nursing notes, vital signs, and past medical records reviewed.    DIAGNOSTIC STUDIES: Oxygen Saturation is 100% on room air, normal by my interpretation.    LABS / RADIOLOGY:  Results for orders placed during the hospital encounter of 05/16/11  POCT I-STAT TROPONIN I      Component Value Range   Troponin i, poc 0.00  0.00 - 0.08 (ng/mL)   Comment 3              Dg Ankle Complete Right  05/16/2011  *RADIOLOGY REPORT*  Clinical Data: Right ankle pain and swelling.  No known injury. History of gout.  RIGHT ANKLE - COMPLETE 3+ VIEW  Comparison: Right foot radiographs 09/02/2007.  Findings: There is diffuse soft tissue prominence with probable soft tissue edema.  No soft tissue calcifications are demonstrated other than diffuse vascular calcifications.  There is no evidence of acute fracture, dislocation or erosive change.  Calcaneal spurring and midfoot degenerative changes are stable.  IMPRESSION: No specific radiographic evidence of gout.  There is nonspecific  probable soft tissue edema with diffuse vascular calcifications.  Original Report Authenticated By: Vivia Ewing, M.D.     ED COURSE / COORDINATION OF CARE: 08:00 - EDMD examined patients right leg and ordered a right Ankle DG. 09:13 - Troponin I ordered. Resulted negative. 09:45 - EDMD rechecked patient. Pain prescription given. Patient to f/u with PCP  on Tuesday.  tendonitis  IMPRESSION: Diagnoses that have been ruled out:  Diagnoses that are still under consideration:  Final diagnoses:    PLAN:  Home The patient is to return the emergency department if there is any worsening of symptoms. I have reviewed the discharge instructions with the patient  CONDITION ON DISCHARGE: Stable  MEDICATIONS GIVEN IN THE E.D.  Medications  cyanocobalamin (,VITAMIN B-12,) 1000 MCG/ML injection (not administered)    DISCHARGE MEDICATIONS: New Prescriptions   No medications on file    The chart was scribed for me under my direct supervision.  I personally performed the history, physical, and medical decision making and all procedures in the evaluation of this patient.Maudry Diego, MD 05/16/11 [phone]

## 2011-05-16 NOTE — ED Notes (Signed)
Pt c/o pain to his right foot. Pt states he has hx of gout in his hands and legs. Pt states pain similar to gout pain.

## 2011-05-16 NOTE — ED Notes (Signed)
Pt c/o pain in the back of his right heel x 2 days. Denies injury. Pt c/o pain with plantar flexion. Pt has strong dorsalis pedal pulse. No swelling noted. Alert and oriented x 3. Skin warm and dry. Color pink. Breath sounds clear and equal bilaterally.

## 2011-05-20 LAB — BASIC METABOLIC PANEL
BUN: 10
BUN: 12
BUN: 13
BUN: 13
BUN: 14
BUN: 15
BUN: 16
CO2: 24
CO2: 24
CO2: 24
CO2: 25
CO2: 27
Calcium: 8.3 — ABNORMAL LOW
Calcium: 8.6
Calcium: 8.6
Calcium: 9.1
Calcium: 9.1
Chloride: 102
Chloride: 103
Chloride: 105
Creatinine, Ser: 1.36
Creatinine, Ser: 1.37
Creatinine, Ser: 1.42
Creatinine, Ser: 1.44
Creatinine, Ser: 1.52 — ABNORMAL HIGH
Creatinine, Ser: 1.53 — ABNORMAL HIGH
Creatinine, Ser: 1.55 — ABNORMAL HIGH
GFR calc Af Amer: 56 — ABNORMAL LOW
GFR calc Af Amer: 57 — ABNORMAL LOW
GFR calc Af Amer: >60
GFR calc Af Amer: >60
GFR calc non Af Amer: 44 — ABNORMAL LOW
GFR calc non Af Amer: 46 — ABNORMAL LOW
GFR calc non Af Amer: 47 — ABNORMAL LOW
GFR calc non Af Amer: 47 — ABNORMAL LOW
GFR calc non Af Amer: 51 — ABNORMAL LOW
GFR calc non Af Amer: 52 — ABNORMAL LOW
GFR calc non Af Amer: 53 — ABNORMAL LOW
GFR calc non Af Amer: 54 — ABNORMAL LOW
Glucose, Bld: 101 — ABNORMAL HIGH
Glucose, Bld: 101 — ABNORMAL HIGH
Glucose, Bld: 101 — ABNORMAL HIGH
Glucose, Bld: 105 — ABNORMAL HIGH
Glucose, Bld: 114 — ABNORMAL HIGH
Glucose, Bld: 124 — ABNORMAL HIGH
Glucose, Bld: 127 — ABNORMAL HIGH
Glucose, Bld: 94
Glucose, Bld: 96
Potassium: 3.4 — ABNORMAL LOW
Potassium: 3.7
Potassium: 3.7
Potassium: 4
Potassium: 4
Potassium: 4.1
Sodium: 132 — ABNORMAL LOW
Sodium: 132 — ABNORMAL LOW
Sodium: 134 — ABNORMAL LOW
Sodium: 135
Sodium: 136
Sodium: 140

## 2011-05-20 LAB — DIFFERENTIAL
Basophils Absolute: 0
Basophils Relative: 0
Eosinophils Absolute: 0.2
Eosinophils Absolute: 0.2
Eosinophils Relative: 2
Eosinophils Relative: 3
Lymphocytes Relative: 12
Lymphocytes Relative: 17
Lymphocytes Relative: 21
Lymphs Abs: 1.1
Lymphs Abs: 1.2
Lymphs Abs: 1.5
Monocytes Absolute: 0.7
Monocytes Absolute: 1.1 — ABNORMAL HIGH
Monocytes Absolute: 1.4 — ABNORMAL HIGH
Monocytes Relative: 14 — ABNORMAL HIGH
Neutro Abs: 4.4
Neutrophils Relative %: 65
Neutrophils Relative %: 72

## 2011-05-20 LAB — CULTURE, BLOOD (ROUTINE X 2)
Culture: NO GROWTH
Report Status: 1062009
Report Status: 1062009

## 2011-05-20 LAB — CBC
HCT: 25 — ABNORMAL LOW
HCT: 25.3 — ABNORMAL LOW
Hemoglobin: 8.4 — ABNORMAL LOW
Hemoglobin: 8.9 — ABNORMAL LOW
MCHC: 32.9
MCV: 86
MCV: 86.3
Platelets: 193
RBC: 3.09 — ABNORMAL LOW
RDW: 17.1 — ABNORMAL HIGH
WBC: 10.3
WBC: 7.2
WBC: 8.9

## 2011-05-20 LAB — CROSSMATCH

## 2011-05-20 LAB — VANCOMYCIN, TROUGH: Vancomycin Tr: 20.1 — ABNORMAL HIGH

## 2011-06-05 LAB — DIFFERENTIAL
Basophils Absolute: 0
Basophils Absolute: 0
Basophils Absolute: 0
Basophils Relative: 0
Basophils Relative: 0
Basophils Relative: 0
Eosinophils Absolute: 0.1
Lymphocytes Relative: 21
Lymphocytes Relative: 27
Lymphs Abs: 1.2
Lymphs Abs: 1.3
Lymphs Abs: 1.4
Monocytes Absolute: 0.7
Monocytes Absolute: 0.9
Monocytes Absolute: 1.5 — ABNORMAL HIGH
Monocytes Relative: 14 — ABNORMAL HIGH
Monocytes Relative: 17 — ABNORMAL HIGH
Monocytes Relative: 17 — ABNORMAL HIGH
Neutro Abs: 2.4
Neutro Abs: 3.4
Neutro Abs: 3.9
Neutro Abs: 4.2
Neutro Abs: 5.9
Neutrophils Relative %: 51
Neutrophils Relative %: 62
Neutrophils Relative %: 65
Neutrophils Relative %: 66

## 2011-06-05 LAB — CBC
HCT: 31 — ABNORMAL LOW
Hemoglobin: 10.3 — ABNORMAL LOW
Hemoglobin: 8.8 — ABNORMAL LOW
Hemoglobin: 8.8 — ABNORMAL LOW
Hemoglobin: 9 — ABNORMAL LOW
MCHC: 33.5
MCHC: 33.8
MCV: 84.5
MCV: 84.7
Platelets: 134 — ABNORMAL LOW
RBC: 3.12 — ABNORMAL LOW
RBC: 3.16 — ABNORMAL LOW
RBC: 3.66 — ABNORMAL LOW
RDW: 18 — ABNORMAL HIGH
RDW: 18.5 — ABNORMAL HIGH
WBC: 4.4
WBC: 6.5
WBC: 6.7
WBC: 8.9

## 2011-06-05 LAB — BASIC METABOLIC PANEL
BUN: 11
BUN: 25 — ABNORMAL HIGH
BUN: 41 — ABNORMAL HIGH
BUN: 7
BUN: 8
CO2: 15 — ABNORMAL LOW
CO2: 15 — ABNORMAL LOW
CO2: 17 — ABNORMAL LOW
CO2: 18 — ABNORMAL LOW
CO2: 20
CO2: 22
Calcium: 7.9 — ABNORMAL LOW
Calcium: 7.9 — ABNORMAL LOW
Calcium: 7.9 — ABNORMAL LOW
Calcium: 8 — ABNORMAL LOW
Calcium: 8.1 — ABNORMAL LOW
Calcium: 8.2 — ABNORMAL LOW
Calcium: 8.2 — ABNORMAL LOW
Chloride: 104
Chloride: 107
Chloride: 109
Chloride: 116 — ABNORMAL HIGH
Chloride: 117 — ABNORMAL HIGH
Chloride: 117 — ABNORMAL HIGH
Chloride: 119 — ABNORMAL HIGH
Creatinine, Ser: 1.11
Creatinine, Ser: 1.17
Creatinine, Ser: 1.18
Creatinine, Ser: 6.14 — ABNORMAL HIGH
GFR calc Af Amer: 14 — ABNORMAL LOW
GFR calc Af Amer: 17 — ABNORMAL LOW
GFR calc Af Amer: 27 — ABNORMAL LOW
GFR calc Af Amer: 41 — ABNORMAL LOW
GFR calc Af Amer: 57 — ABNORMAL LOW
GFR calc Af Amer: >60
GFR calc Af Amer: >60
GFR calc Af Amer: >60
GFR calc Af Amer: >60
GFR calc Af Amer: >60
GFR calc non Af Amer: 11 — ABNORMAL LOW
GFR calc non Af Amer: 23 — ABNORMAL LOW
GFR calc non Af Amer: 55 — ABNORMAL LOW
GFR calc non Af Amer: 9 — ABNORMAL LOW
GFR calc non Af Amer: >60
GFR calc non Af Amer: >60
GFR calc non Af Amer: >60
Glucose, Bld: 101 — ABNORMAL HIGH
Glucose, Bld: 104 — ABNORMAL HIGH
Glucose, Bld: 79
Glucose, Bld: 82
Glucose, Bld: 94
Glucose, Bld: 97
Potassium: 3.6
Potassium: 4
Potassium: 4.3
Potassium: 4.3
Potassium: 4.6
Potassium: 4.8
Potassium: 5.1
Sodium: 134 — ABNORMAL LOW
Sodium: 137
Sodium: 137
Sodium: 138
Sodium: 138
Sodium: 138
Sodium: 139
Sodium: 140

## 2011-06-05 LAB — BODY FLUID CELL COUNT WITH DIFFERENTIAL
Lymphs, Fluid: 1
Neutrophil Count, Fluid: 91 — ABNORMAL HIGH
Total Nucleated Cell Count, Fluid: 20000 — ABNORMAL HIGH

## 2011-06-05 LAB — URINALYSIS, ROUTINE W REFLEX MICROSCOPIC
Bilirubin Urine: NEGATIVE
Glucose, UA: NEGATIVE
Ketones, ur: NEGATIVE
Leukocytes, UA: NEGATIVE
Protein, ur: NEGATIVE
Specific Gravity, Urine: 1.025
Urobilinogen, UA: 0.2
pH: 5.5
pH: 6

## 2011-06-05 LAB — BLOOD GAS, ARTERIAL
Bicarbonate: 13 — ABNORMAL LOW
FIO2: 21
pCO2 arterial: 29.1 — ABNORMAL LOW
pH, Arterial: 7.274 — ABNORMAL LOW
pO2, Arterial: 88.1

## 2011-06-05 LAB — COMPREHENSIVE METABOLIC PANEL
ALT: 10
Alkaline Phosphatase: 63
BUN: 42 — ABNORMAL HIGH
CO2: 16 — ABNORMAL LOW
Chloride: 108
GFR calc non Af Amer: 8 — ABNORMAL LOW
Glucose, Bld: 92
Potassium: 4.7
Sodium: 133 — ABNORMAL LOW
Total Bilirubin: 0.5
Total Protein: 7.3

## 2011-06-05 LAB — URINE CULTURE: Special Requests: NEGATIVE

## 2011-06-05 LAB — CULTURE, BLOOD (ROUTINE X 2): Culture: NO GROWTH

## 2011-06-05 LAB — ETHANOL
Alcohol, Ethyl (B): 55 — ABNORMAL HIGH
Alcohol, Ethyl (B): <5

## 2011-06-05 LAB — URINE MICROSCOPIC-ADD ON

## 2011-06-05 LAB — RAPID URINE DRUG SCREEN, HOSP PERFORMED
Amphetamines: NOT DETECTED
Cocaine: NOT DETECTED
Opiates: NOT DETECTED
Tetrahydrocannabinol: NOT DETECTED

## 2011-06-05 LAB — APTT: aPTT: 30

## 2011-06-05 LAB — BODY FLUID CULTURE: Culture: NO GROWTH

## 2011-06-05 LAB — LIPASE, BLOOD: Lipase: 44

## 2011-06-05 LAB — PROTIME-INR: INR: 1

## 2011-06-05 LAB — AMYLASE: Amylase: 107

## 2011-06-10 LAB — DIFFERENTIAL
Eosinophils Relative: 3
Lymphocytes Relative: 22
Lymphs Abs: 1.5
Monocytes Relative: 10

## 2011-06-10 LAB — C-REACTIVE PROTEIN: CRP: 0.6 — ABNORMAL HIGH (ref <0.6)

## 2011-06-10 LAB — CBC
Hemoglobin: 11.6 — ABNORMAL LOW
MCHC: 33
MCV: 85
RBC: 4.15 — ABNORMAL LOW
WBC: 6.7

## 2011-06-10 LAB — ANAEROBIC CULTURE

## 2011-06-10 LAB — CULTURE, ROUTINE-ABSCESS

## 2011-06-10 LAB — BASIC METABOLIC PANEL
CO2: 27
Chloride: 102
Creatinine, Ser: 1.28
GFR calc Af Amer: >60
Potassium: 3.4 — ABNORMAL LOW
Sodium: 135

## 2011-06-10 LAB — SEDIMENTATION RATE: Sed Rate: 53 — ABNORMAL HIGH

## 2011-06-15 LAB — BASIC METABOLIC PANEL
BUN: 3 — ABNORMAL LOW
BUN: 7
CO2: 29
Calcium: 7.2 — ABNORMAL LOW
Calcium: 8.1 — ABNORMAL LOW
Chloride: 99
Creatinine, Ser: 0.89
Creatinine, Ser: 0.93
Creatinine, Ser: 1.04
GFR calc non Af Amer: >60
Glucose, Bld: 102 — ABNORMAL HIGH
Potassium: 2.9 — ABNORMAL LOW

## 2011-06-15 LAB — CBC
HCT: 24.4 — ABNORMAL LOW
HCT: 27.9 — ABNORMAL LOW
HCT: 28.5 — ABNORMAL LOW
HCT: 29.9 — ABNORMAL LOW
HCT: 30.1 — ABNORMAL LOW
Hemoglobin: 10.1 — ABNORMAL LOW
Hemoglobin: 8.3 — ABNORMAL LOW
Hemoglobin: 9.2 — ABNORMAL LOW
Hemoglobin: 9.8 — ABNORMAL LOW
MCHC: 33.7
MCHC: 33.9
MCV: 88.7
MCV: 88.9
Platelets: 69 — ABNORMAL LOW
Platelets: 75 — ABNORMAL LOW
RDW: 17.4 — ABNORMAL HIGH
RDW: 17.8 — ABNORMAL HIGH
RDW: 18 — ABNORMAL HIGH
WBC: 10.4
WBC: 5.8
WBC: 7.3

## 2011-06-15 LAB — FERRITIN: Ferritin: 211 (ref 22–322)

## 2011-06-15 LAB — COMPREHENSIVE METABOLIC PANEL
Alkaline Phosphatase: 36 — ABNORMAL LOW
BUN: 8
Chloride: 97
GFR calc non Af Amer: >60
Glucose, Bld: 91
Potassium: 2.3 — CL
Total Bilirubin: 0.9

## 2011-06-15 LAB — POCT I-STAT 3, ART BLOOD GAS (G3+)
Operator id: 284731
Patient temperature: 99.3
TCO2: 28
pH, Arterial: 7.439

## 2011-06-15 LAB — RETICULOCYTES
Retic Count, Absolute: 64
Retic Ct Pct: 2

## 2011-06-15 LAB — FOLATE: Folate: 11.2

## 2011-06-15 LAB — MAGNESIUM: Magnesium: 1.9

## 2011-06-15 LAB — IRON AND TIBC
Saturation Ratios: 7 — ABNORMAL LOW
UIBC: 172

## 2011-06-15 LAB — PREPARE RBC (CROSSMATCH)

## 2011-06-16 LAB — HEPATIC FUNCTION PANEL
AST: 53 — ABNORMAL HIGH
Albumin: 3.4 — ABNORMAL LOW
Alkaline Phosphatase: 65
Bilirubin, Direct: 0.3
Total Bilirubin: 1.6 — ABNORMAL HIGH

## 2011-06-16 LAB — POCT I-STAT 3, ART BLOOD GAS (G3+)
O2 Saturation: 99
Operator id: 252761
pCO2 arterial: 46.3 — ABNORMAL HIGH
pH, Arterial: 7.25 — ABNORMAL LOW

## 2011-06-16 LAB — ABO/RH: ABO/RH(D): A POS

## 2011-06-16 LAB — TYPE AND SCREEN

## 2011-06-16 LAB — CBC
HCT: 27.4 — ABNORMAL LOW
HCT: 28.2 — ABNORMAL LOW
HCT: 31.6 — ABNORMAL LOW
Hemoglobin: 13.6
Hemoglobin: 9.4 — ABNORMAL LOW
MCHC: 33.3
MCHC: 33.9
MCHC: 33.9
MCHC: 34.3
MCV: 88.8
MCV: 89.3
MCV: 89.6
MCV: 90.3
Platelets: 53 — ABNORMAL LOW
Platelets: 66 — ABNORMAL LOW
Platelets: 68 — ABNORMAL LOW
Platelets: 69 — ABNORMAL LOW
RBC: 3.54 — ABNORMAL LOW
RBC: 4.41
RDW: 17.7 — ABNORMAL HIGH
RDW: 17.8 — ABNORMAL HIGH
RDW: 17.9 — ABNORMAL HIGH
WBC: 16.3 — ABNORMAL HIGH
WBC: 18 — ABNORMAL HIGH
WBC: 6.5

## 2011-06-16 LAB — DIFFERENTIAL
Basophils Absolute: 0
Basophils Relative: 0
Basophils Relative: 0
Eosinophils Absolute: 0
Monocytes Absolute: 1 — ABNORMAL HIGH
Neutro Abs: 4.5
Neutrophils Relative %: 83 — ABNORMAL HIGH

## 2011-06-16 LAB — BASIC METABOLIC PANEL
BUN: 13
BUN: 18
CO2: 21
CO2: 27
Calcium: 8.9
Chloride: 103
Creatinine, Ser: 4.37 — ABNORMAL HIGH
GFR calc Af Amer: 17 — ABNORMAL LOW
GFR calc non Af Amer: 14 — ABNORMAL LOW
Glucose, Bld: 178 — ABNORMAL HIGH
Potassium: 3.1 — ABNORMAL LOW
Sodium: 134 — ABNORMAL LOW

## 2011-06-16 LAB — COMPREHENSIVE METABOLIC PANEL
BUN: 16
CO2: 20
Calcium: 6.5 — ABNORMAL LOW
Chloride: 105
Creatinine, Ser: 3.19 — ABNORMAL HIGH
GFR calc Af Amer: 24 — ABNORMAL LOW
GFR calc non Af Amer: 20 — ABNORMAL LOW
Total Bilirubin: 2.5 — ABNORMAL HIGH

## 2011-06-16 LAB — CARDIAC PANEL(CRET KIN+CKTOT+MB+TROPI)
Total CK: 214
Troponin I: 0.05

## 2011-06-16 LAB — PHOSPHORUS: Phosphorus: 2.6

## 2011-06-16 LAB — BLOOD GAS, ARTERIAL
O2 Content: 4
O2 Saturation: 98.7
Patient temperature: 37

## 2011-06-16 LAB — URINALYSIS, ROUTINE W REFLEX MICROSCOPIC
Bilirubin Urine: NEGATIVE
Ketones, ur: NEGATIVE
Nitrite: NEGATIVE
Protein, ur: NEGATIVE
Urobilinogen, UA: 0.2

## 2011-06-16 LAB — CORTISOL: Cortisol, Plasma: 23.8

## 2011-06-16 LAB — PROTIME-INR
INR: 1.3
Prothrombin Time: 12.8

## 2011-06-16 LAB — OSMOLALITY, URINE: Osmolality, Ur: 402

## 2011-06-16 LAB — SODIUM, URINE, RANDOM: Sodium, Ur: 136

## 2011-06-16 LAB — PREPARE RBC (CROSSMATCH)

## 2011-06-16 LAB — APTT
aPTT: 24
aPTT: 24

## 2011-06-16 LAB — PREPARE PLATELET PHERESIS

## 2011-06-16 LAB — MAGNESIUM: Magnesium: 1.3 — ABNORMAL LOW

## 2011-07-01 ENCOUNTER — Other Ambulatory Visit: Payer: Self-pay | Admitting: Orthopedic Surgery

## 2011-07-01 DIAGNOSIS — M25569 Pain in unspecified knee: Secondary | ICD-10-CM

## 2011-07-01 DIAGNOSIS — G8929 Other chronic pain: Secondary | ICD-10-CM

## 2011-10-25 ENCOUNTER — Emergency Department (HOSPITAL_COMMUNITY)
Admission: EM | Admit: 2011-10-25 | Discharge: 2011-10-25 | Disposition: A | Discharge status: Home / Self Care | Payer: Medicaid Other | Attending: Emergency Medicine | Admitting: Emergency Medicine

## 2011-10-25 ENCOUNTER — Encounter (HOSPITAL_COMMUNITY): Payer: Self-pay

## 2011-10-25 ENCOUNTER — Emergency Department (HOSPITAL_COMMUNITY): Payer: Medicaid Other

## 2011-10-25 DIAGNOSIS — R059 Cough, unspecified: Secondary | ICD-10-CM | POA: Insufficient documentation

## 2011-10-25 DIAGNOSIS — R05 Cough: Secondary | ICD-10-CM | POA: Insufficient documentation

## 2011-10-25 DIAGNOSIS — R509 Fever, unspecified: Secondary | ICD-10-CM | POA: Insufficient documentation

## 2011-10-25 DIAGNOSIS — J40 Bronchitis, not specified as acute or chronic: Secondary | ICD-10-CM | POA: Insufficient documentation

## 2011-10-25 DIAGNOSIS — I10 Essential (primary) hypertension: Secondary | ICD-10-CM | POA: Insufficient documentation

## 2011-10-25 MED ORDER — AZITHROMYCIN 250 MG PO TABS: 250.0000 mg | ORAL_TABLET | Freq: Every day | ORAL | Status: AC

## 2011-10-25 NOTE — ED Notes (Signed)
Patient transported to X-ray 

## 2011-10-25 NOTE — ED Notes (Signed)
Pt reports has had cold symptoms, chills, productive cough with yellow sputum x 2 1/2 weeks.  Says Dr Everette Rank put him on an antibiotic but says it made pt vomit so he quit taking them.  ( PT was put on omnicef)

## 2011-10-25 NOTE — Discharge Instructions (Signed)

## 2011-10-25 NOTE — ED Notes (Signed)
Patient with no complaints at this time. Respirations even and unlabored. Skin warm/dry. Discharge instructions reviewed with patient at this time. Patient given opportunity to voice concerns/ask questions. Patient discharged at this time and left Emergency Department with steady gait.   

## 2011-10-26 NOTE — ED Provider Notes (Deleted)
History     CSN: [id number]  Arrival date & time 10/25/11  [id number]   First MD Initiated Contact with Patient 10/25/11 1032      Chief Complaint  Patient presents with  . URI    (Consider location/radiation/quality/duration/timing/severity/associated sxs/prior treatment) HPI  Past Medical History  Diagnosis Date  . Diverticulosis 2008 LGIB  . Gout   . HTN (hypertension)   . History of septic arthritis   . History of alcohol abuse   . Anemia FeDA: GASTRIC POLYPS, B12    TCS 2008 EGD 2009, 2008-HB 11.1 MCV 83.6 CR 1.22, 2009 FERRITIN 102-22  . Hepatomegaly 2o to FATTY LIVER DZ    Past Surgical History  Procedure Date  . Upper gastrointestinal endoscopy [facility] 2009    INFLAMED HYPERPLASTIC POLYPS, CHRONIC GASTRITIS  . Colonoscopy 2008 [facility] DJ    LGIB 2o to TICS, prep good  . Replacement total knee bilateral   . Hand surgery     Family History  Problem Relation Age of Onset  . Colon polyps Neg Hx   . Colon cancer Neg Hx     History  Substance Use Topics  . Smoking status: Never Smoker   . Smokeless tobacco: Not on file  . Alcohol Use: Yes      Review of Systems  Allergies  Aspirin  Home Medications   Current Outpatient Rx  Name Route Sig Dispense Refill  . AMLODIPINE BESYLATE 10 MG PO TABS Oral Take 1 tablet (10 mg total) by mouth daily. 30 tablet 11  . AZITHROMYCIN 250 MG PO TABS Oral Take 1 tablet (250 mg total) by mouth daily. Take first 2 tablets together, then 1 every day until finished. 6 tablet 0  . CYANOCOBALAMIN 1000 MCG/ML IJ SOLN Intramuscular Inject 1,000 mcg into the muscle every 30 (thirty) days.      . FEBUXOSTAT 40 MG PO TABS Oral Take 40 mg by mouth daily.     Marland Kitchen FOLIC ACID 1 MG PO TABS Oral Take 1 mg by mouth daily.      Marland Kitchen LEVOTHYROXINE SODIUM 75 MCG PO TABS Oral Take 75 mcg by mouth daily.      Marland Kitchen METOPROLOL SUCCINATE ER 25 MG PO TB24 Oral Take 25 mg by mouth every morning.     Marland Kitchen OMEPRAZOLE 20 MG PO CPDR Oral Take 20 mg by mouth daily.        Marland Kitchen POLY-IRON 150 150 MG PO CAPS  TAKE (1) CAPSULE BY MOUTHTWICE DAILY. 62 each 1    Dispense as written.  Marland Kitchen VICODIN 5-500 MG PO TABS  TAKE 1 TABLET BY MOUTH EVERY 4 HOURS AS NEEDED FOR PAIN. 60 each 5    BP 136/80  Pulse 134  Temp(Src) 99.7 F (37.6 C) (Oral)  Resp 22  Ht 6' (1.829 m)  Wt 250 lb (113.399 kg)  BMI 33.91 kg/m2  SpO2 94%  Physical Exam  ED Course  Procedures (including critical care time)  Labs Reviewed - No data to display Dg Chest 2 View  10/25/2011  *RADIOLOGY REPORT*  Clinical Data: Cough and fever  CHEST - 2 VIEW  Comparison: 03/08/2010  Findings: Normal heart size.  Increased markings at the lung bases are stable.  Lungs otherwise clear.  No pneumothorax.  No pleural fluid.  IMPRESSION: Basilar scarring.  No active cardiopulmonary disease.  Original Report Authenticated By: Jamas Lav, M.D.     1. Bronchitis       MDM  Xray okay, will treat with zmax  due to length of illness, no improvement with otc meds and time.   I personally performed the services described in this documentation, which was scribed in my presence. The recorded information has been reviewed and considered.       Veryl Speak, MD 10/26/11 [phone]

## 2011-11-10 ENCOUNTER — Other Ambulatory Visit: Payer: Self-pay

## 2011-11-10 NOTE — ED Provider Notes (Deleted)
Please see note scribed by Lionel December with same date.    Veryl Speak, MD 11/10/11 [phone]

## 2011-11-11 MED ORDER — POLYSACCHARIDE IRON COMPLEX 150 MG PO CAPS: 150.0000 mg | ORAL_CAPSULE | Freq: Two times a day (BID) | ORAL | Status: DC

## 2011-11-25 NOTE — ED Provider Notes (Signed)
History     CSN: [id number]  Arrival date & time 10/25/11  [id number]   First MD Initiated Contact with Patient 10/25/11 1032      Chief Complaint  Patient presents with  . URI    (Consider location/radiation/quality/duration/timing/severity/associated sxs/prior treatment) HPI Comments: Was given antibiotic for the same that he quit taking due to nausea.  Patient is a 61 y.o. male presenting with URI. The history is provided by the patient.  URI The primary symptoms include fever, fatigue and cough. Primary symptoms do not include nausea or vomiting. This is a new problem. The problem has been gradually worsening.  Symptoms associated with the illness include congestion.    Past Medical History  Diagnosis Date  . Diverticulosis 2008 LGIB  . Gout   . HTN (hypertension)   . History of septic arthritis   . History of alcohol abuse   . Anemia FeDA: GASTRIC POLYPS, B12    TCS 2008 EGD 2009, 2008-HB 11.1 MCV 83.6 CR 1.22, 2009 FERRITIN 102-22  . Hepatomegaly 2o to FATTY LIVER DZ    Past Surgical History  Procedure Date  . Upper gastrointestinal endoscopy [facility] 2009    INFLAMED HYPERPLASTIC POLYPS, CHRONIC GASTRITIS  . Colonoscopy 2008 [facility] DJ    LGIB 2o to TICS, prep good  . Replacement total knee bilateral   . Hand surgery     Family History  Problem Relation Age of Onset  . Colon polyps Neg Hx   . Colon cancer Neg Hx     History  Substance Use Topics  . Smoking status: Never Smoker   . Smokeless tobacco: Not on file  . Alcohol Use: Yes      Review of Systems  Constitutional: Positive for fever and fatigue.  HENT: Positive for congestion.   Respiratory: Positive for cough.   Gastrointestinal: Negative for nausea and vomiting.  All other systems reviewed and are negative.    Allergies  Aspirin  Home Medications   Current Outpatient Rx  Name Route Sig Dispense Refill  . AMLODIPINE BESYLATE 10 MG PO TABS Oral Take 1 tablet (10 mg total) by mouth daily. 30  tablet 11  . CYANOCOBALAMIN 1000 MCG/ML IJ SOLN Intramuscular Inject 1,000 mcg into the muscle every 30 (thirty) days.      . FEBUXOSTAT 40 MG PO TABS Oral Take 40 mg by mouth daily.     Marland Kitchen FOLIC ACID 1 MG PO TABS Oral Take 1 mg by mouth daily.      Marland Kitchen POLYSACCHARIDE IRON COMPLEX 150 MG PO CAPS Oral Take 1 capsule (150 mg total) by mouth 2 (two) times daily. 60 each 11  . LEVOTHYROXINE SODIUM 75 MCG PO TABS Oral Take 75 mcg by mouth daily.      Marland Kitchen METOPROLOL SUCCINATE ER 25 MG PO TB24 Oral Take 25 mg by mouth every morning.     Marland Kitchen OMEPRAZOLE 20 MG PO CPDR Oral Take 20 mg by mouth daily.      Marland Kitchen VICODIN 5-500 MG PO TABS  TAKE 1 TABLET BY MOUTH EVERY 4 HOURS AS NEEDED FOR PAIN. 60 each 5    BP 136/80  Pulse 134  Temp(Src) 99.7 F (37.6 C) (Oral)  Resp 22  Ht 6' (1.829 m)  Wt 250 lb (113.399 kg)  BMI 33.91 kg/m2  SpO2 94%  Physical Exam  Nursing note and vitals reviewed. Constitutional: He is oriented to person, place, and time. He appears well-developed and well-nourished. No distress.  HENT:  Head: Normocephalic and  atraumatic.  Right Ear: External ear normal.  Left Ear: External ear normal.  Neck: Normal range of motion. Neck supple.  Cardiovascular: Normal rate and regular rhythm.   No murmur heard. Pulmonary/Chest: Effort normal and breath sounds normal. No respiratory distress.  Abdominal: Soft. Bowel sounds are normal. He exhibits no distension. There is no tenderness.  Musculoskeletal: Normal range of motion. He exhibits no edema.  Lymphadenopathy:    He has no cervical adenopathy.  Neurological: He is alert and oriented to person, place, and time.  Skin: Skin is warm and dry. He is not diaphoretic.    ED Course  Procedures (including critical care time)  Labs Reviewed - No data to display No results found.   1. Bronchitis       MDM  Will prescribe antibiotic, return prn.        Veryl Speak, MD 11/25/11 [phone]

## 2012-01-01 ENCOUNTER — Encounter: Payer: Self-pay | Admitting: Gastroenterology

## 2012-01-19 ENCOUNTER — Other Ambulatory Visit: Payer: Self-pay | Admitting: *Deleted

## 2012-01-19 DIAGNOSIS — M25569 Pain in unspecified knee: Secondary | ICD-10-CM

## 2012-01-19 MED ORDER — HYDROCODONE-ACETAMINOPHEN 5-500 MG PO TABS: 1.0000 | ORAL_TABLET | ORAL | Status: DC | PRN

## 2012-02-01 ENCOUNTER — Other Ambulatory Visit: Payer: Self-pay | Admitting: Gastroenterology

## 2012-02-01 NOTE — Telephone Encounter (Signed)
Patient should get from PCP

## 2012-02-02 NOTE — Telephone Encounter (Signed)
Addended by: Andria Meuse on: 02/02/2012 04:17 PM   Modules accepted: Orders

## 2012-02-11 ENCOUNTER — Ambulatory Visit: Payer: Medicare Other | Admitting: Gastroenterology

## 2012-02-24 ENCOUNTER — Ambulatory Visit (INDEPENDENT_AMBULATORY_CARE_PROVIDER_SITE_OTHER): Payer: Medicare Other | Admitting: Gastroenterology

## 2012-02-24 ENCOUNTER — Encounter: Payer: Self-pay | Admitting: Gastroenterology

## 2012-02-24 ENCOUNTER — Ambulatory Visit: Payer: Medicare Other | Admitting: Gastroenterology

## 2012-02-24 VITALS — BP 125/76 | HR 85 | Temp 97.6°F | Ht 72.5 in | Wt 253.0 lb

## 2012-02-24 DIAGNOSIS — K7689 Other specified diseases of liver: Secondary | ICD-10-CM

## 2012-02-24 DIAGNOSIS — D509 Iron deficiency anemia, unspecified: Secondary | ICD-10-CM

## 2012-02-24 DIAGNOSIS — Z1211 Encounter for screening for malignant neoplasm of colon: Secondary | ICD-10-CM

## 2012-02-24 NOTE — Assessment & Plan Note (Signed)
TCS 2018-DISCUSSED WITH PT

## 2012-02-24 NOTE — Progress Notes (Signed)
  Subjective:   PCP: [facility]   Patient ID: [name], male    DOB: 10/24/1950, 61 y.o.   MRN: [id number] HPI PT DENIES FEVER, CHILLS, BRBPR, nausea, vomiting, melena, diarrhea, constipation, abd pain, problems swallowing, heartburn or indigestion.  Past Medical History  Diagnosis Date  . Diverticulosis 2008 LGIB  . Gout   . HTN (hypertension)   . History of septic arthritis   . History of alcohol abuse   . Anemia FeDA: GASTRIC POLYPS, B12    TCS 2008 EGD 2009, 2008-HB 11.1 MCV 83.6 CR 1.22, 2009 FERRITIN 102-22  . Hepatomegaly 2o to FATTY LIVER DZ    Past Surgical History  Procedure Date  . Upper gastrointestinal endoscopy APR 2009    INFLAMED HYPERPLASTIC POLYPS, CHRONIC GASTRITIS  . Colonoscopy 2008 [facility] DJ    LGIB 2o to TICS, prep good  . Replacement total knee bilateral   . Hand surgery     Allergies  Allergen Reactions  . Aspirin     REACTION: Diverticular Bleed    Current Outpatient Prescriptions  Medication Sig Dispense Refill  . amLODipine (NORVASC) 10 MG tablet Take 10 mg by mouth daily.      . cyanocobalamin (,VITAMIN B-12,) 1000 MCG/ML injection Inject 1,000 mcg into the muscle every 30 (thirty) days.        . febuxostat (ULORIC) 40 MG tablet Take 40 mg by mouth daily.       . folic acid (FOLVITE) 1 MG tablet Take 1 mg by mouth daily.        . iron polysaccharides (POLY-IRON 150) 150 MG capsule Take 1 capsule (150 mg total) by mouth 2 (two) times daily.    Marland Kitchen levothyroxine (SYNTHROID, LEVOTHROID) 75 MCG tablet Take 100 mcg by mouth daily.     . metoprolol succinate (TOPROL-XL) 25 MG 24 hr tablet Take 25 mg by mouth every morning.     Marland Kitchen omeprazole (PRILOSEC) 20 MG capsule Take 20 mg by mouth daily.      Marland Kitchen VICODIN 5-500 MG per tablet TAKE 1 TABLET BY MOUTH EVERY 4 HOURS AS NEEDED FOR PAIN.    Marland Kitchen amLODipine (NORVASC) 10 MG tablet Take 1 tablet (10 mg total) by mouth daily.    Marland Kitchen HYDROcodone-acetaminophen (VICODIN) 5-500 MG per tablet Take 1 tablet by mouth every 4  (four) hours as needed for pain.         Review of Systems     Objective:   Physical Exam  Vitals reviewed. Constitutional: He is oriented to person, place, and time. He appears well-nourished. No distress.  HENT:  Head: Normocephalic and atraumatic.  Mouth/Throat: Oropharynx is clear and moist. No oropharyngeal exudate.  Eyes: Pupils are equal, round, and reactive to light. No scleral icterus.  Neck: Normal range of motion. Neck supple.  Cardiovascular: Normal rate, regular rhythm and normal heart sounds.   Pulmonary/Chest: Effort normal and breath sounds normal. No respiratory distress.  Abdominal: Soft. Bowel sounds are normal. He exhibits no distension. There is no tenderness.  Musculoskeletal: Normal range of motion. He exhibits no edema.  Lymphadenopathy:    He has no cervical adenopathy.  Neurological: He is alert and oriented to person, place, and time.       NO FOCAL DEFICITS   Psychiatric: He has a normal mood and affect.          Assessment & Plan:

## 2012-02-24 NOTE — Assessment & Plan Note (Signed)
GAINED 10 LBS SINCE LAST YEAR.  LOSE 10 LBS. CHECK HFP. OPV IN 1 YEAR.

## 2012-02-24 NOTE — Assessment & Plan Note (Signed)
NO EVIDENCE OF ACTIVE GIB  CR, CBC, FERRITIN TODAY. OPV IN 1 YEAR.

## 2012-02-24 NOTE — Progress Notes (Signed)
Faxed to PCP

## 2012-02-24 NOTE — Patient Instructions (Signed)
YOU NEED TO HAVE YOUR LABS DRAWN.  FOLLOW UP IN ONE YEAR.

## 2012-02-25 NOTE — Progress Notes (Signed)
Reminder in epic to follow up in one year and tcs in 2018

## 2012-02-26 LAB — CBC WITH DIFFERENTIAL/PLATELET
Eosinophils Absolute: 0.2 10*3/uL (ref 0.0–0.7)
Hemoglobin: 13.8 g/dL (ref 13.0–17.0)
Lymphocytes Relative: 47 % — ABNORMAL HIGH (ref 12–46)
Lymphs Abs: 1.9 10*3/uL (ref 0.7–4.0)
MCH: 31.8 pg (ref 26.0–34.0)
Monocytes Relative: 13 % — ABNORMAL HIGH (ref 3–12)
Neutro Abs: 1.4 10*3/uL — ABNORMAL LOW (ref 1.7–7.7)
Neutrophils Relative %: 34 % — ABNORMAL LOW (ref 43–77)
Platelets: 125 10*3/uL — ABNORMAL LOW (ref 150–400)
RBC: 4.34 MIL/uL (ref 4.22–5.81)
WBC: 4 10*3/uL (ref 4.0–10.5)

## 2012-02-26 LAB — COMPREHENSIVE METABOLIC PANEL
Albumin: 3.8 g/dL (ref 3.5–5.2)
BUN: 19 mg/dL (ref 6–23)
CO2: 27 mEq/L (ref 19–32)
Calcium: 9.3 mg/dL (ref 8.4–10.5)
Chloride: 102 mEq/L (ref 96–112)
Glucose, Bld: 94 mg/dL (ref 70–99)
Potassium: 3.7 mEq/L (ref 3.5–5.3)
Sodium: 138 mEq/L (ref 135–145)
Total Protein: 7.5 g/dL (ref 6.0–8.3)

## 2012-02-26 LAB — FERRITIN: Ferritin: 124 ng/mL (ref 22–322)

## 2012-03-07 ENCOUNTER — Other Ambulatory Visit: Payer: Self-pay

## 2012-03-07 ENCOUNTER — Telehealth: Payer: Self-pay | Admitting: Gastroenterology

## 2012-03-07 DIAGNOSIS — D649 Anemia, unspecified: Secondary | ICD-10-CM

## 2012-03-07 NOTE — Telephone Encounter (Signed)
PLEASE CALL PT. HIS BLOOD COUNT AND IRON STORES ARE NORMAL. STOP TAKING IRON. REPEAT CBC AND FERRITIN IN 4 MOS. ONE LIVER TEST WAS SLIGHTLY ABNORMAL. HE SHOULD FOLLOW A LOW FAT DIET AND LOSE 10 LBS. OPV IN 1 YEAR.

## 2012-03-07 NOTE — Telephone Encounter (Signed)
Results Cc to PCP  

## 2012-03-07 NOTE — Telephone Encounter (Signed)
Pt aware of results. I have also order the labs for 4 months and put them in the box. [name] can NIC for one year follow up

## 2012-03-08 NOTE — Telephone Encounter (Signed)
Reminder in epic to follow up in one year

## 2012-06-15 ENCOUNTER — Other Ambulatory Visit: Payer: Self-pay

## 2012-06-15 DIAGNOSIS — D649 Anemia, unspecified: Secondary | ICD-10-CM

## 2012-07-11 MED ORDER — HYDROCODONE-ACETAMINOPHEN 5-325 MG PO TABS: 1.0000 | ORAL_TABLET | ORAL | Status: DC | PRN

## 2012-09-07 ENCOUNTER — Encounter (HOSPITAL_COMMUNITY): Payer: Self-pay | Admitting: Emergency Medicine

## 2012-09-07 ENCOUNTER — Emergency Department (HOSPITAL_COMMUNITY)
Admission: EM | Admit: 2012-09-07 | Discharge: 2012-09-07 | Disposition: A | Discharge status: Home / Self Care | Payer: Medicare Other | Attending: Emergency Medicine | Admitting: Emergency Medicine

## 2012-09-07 DIAGNOSIS — R51 Headache: Secondary | ICD-10-CM | POA: Insufficient documentation

## 2012-09-07 DIAGNOSIS — Z8719 Personal history of other diseases of the digestive system: Secondary | ICD-10-CM | POA: Insufficient documentation

## 2012-09-07 DIAGNOSIS — I10 Essential (primary) hypertension: Secondary | ICD-10-CM | POA: Insufficient documentation

## 2012-09-07 DIAGNOSIS — D649 Anemia, unspecified: Secondary | ICD-10-CM | POA: Insufficient documentation

## 2012-09-07 DIAGNOSIS — K529 Noninfective gastroenteritis and colitis, unspecified: Secondary | ICD-10-CM

## 2012-09-07 DIAGNOSIS — R109 Unspecified abdominal pain: Secondary | ICD-10-CM | POA: Insufficient documentation

## 2012-09-07 DIAGNOSIS — M109 Gout, unspecified: Secondary | ICD-10-CM | POA: Insufficient documentation

## 2012-09-07 DIAGNOSIS — K5289 Other specified noninfective gastroenteritis and colitis: Secondary | ICD-10-CM | POA: Insufficient documentation

## 2012-09-07 DIAGNOSIS — G8929 Other chronic pain: Secondary | ICD-10-CM | POA: Insufficient documentation

## 2012-09-07 DIAGNOSIS — R6883 Chills (without fever): Secondary | ICD-10-CM | POA: Insufficient documentation

## 2012-09-07 DIAGNOSIS — Z9089 Acquired absence of other organs: Secondary | ICD-10-CM | POA: Insufficient documentation

## 2012-09-07 DIAGNOSIS — M25569 Pain in unspecified knee: Secondary | ICD-10-CM | POA: Insufficient documentation

## 2012-09-07 DIAGNOSIS — Z79899 Other long term (current) drug therapy: Secondary | ICD-10-CM | POA: Insufficient documentation

## 2012-09-07 DIAGNOSIS — Z8739 Personal history of other diseases of the musculoskeletal system and connective tissue: Secondary | ICD-10-CM | POA: Insufficient documentation

## 2012-09-07 DIAGNOSIS — Z87891 Personal history of nicotine dependence: Secondary | ICD-10-CM | POA: Insufficient documentation

## 2012-09-07 LAB — LIPASE, BLOOD: Lipase: 48 U/L (ref 11–59)

## 2012-09-07 LAB — CBC WITH DIFFERENTIAL/PLATELET
Basophils Relative: 1 % (ref 0–1)
Eosinophils Absolute: 0 10*3/uL (ref 0.0–0.7)
Eosinophils Relative: 1 % (ref 0–5)
HCT: 44.5 % (ref 39.0–52.0)
Hemoglobin: 14.7 g/dL (ref 13.0–17.0)
MCH: 30.7 pg (ref 26.0–34.0)
MCHC: 33 g/dL (ref 30.0–36.0)
MCV: 92.9 fL (ref 78.0–100.0)
Monocytes Absolute: 0.6 10*3/uL (ref 0.1–1.0)
Monocytes Relative: 13 % — ABNORMAL HIGH (ref 3–12)
Neutro Abs: 1.9 10*3/uL (ref 1.7–7.7)

## 2012-09-07 LAB — COMPREHENSIVE METABOLIC PANEL
Albumin: 4 g/dL (ref 3.5–5.2)
BUN: 9 mg/dL (ref 6–23)
Chloride: 94 mEq/L — ABNORMAL LOW (ref 96–112)
Creatinine, Ser: 1.14 mg/dL (ref 0.50–1.35)
GFR calc non Af Amer: 67 mL/min — ABNORMAL LOW (ref >90)
Total Bilirubin: 1 mg/dL (ref 0.3–1.2)

## 2012-09-07 MED ORDER — KETOROLAC TROMETHAMINE 30 MG/ML IJ SOLN
INTRAMUSCULAR | Status: AC
  Administered 2012-09-07: 30 mg
  Filled 2012-09-07: qty 1

## 2012-09-07 MED ORDER — SODIUM CHLORIDE 0.9 % IV SOLN
Freq: Once | INTRAVENOUS | Status: AC
  Administered 2012-09-07: 01:00:00 via INTRAVENOUS

## 2012-09-07 MED ORDER — PROMETHAZINE HCL 25 MG PO TABS: 25.0000 mg | ORAL_TABLET | Freq: Four times a day (QID) | ORAL | Status: DC | PRN

## 2012-09-07 MED ORDER — ONDANSETRON HCL 4 MG/2ML IJ SOLN
INTRAMUSCULAR | Status: AC
  Administered 2012-09-07: 4 mg
  Filled 2012-09-07: qty 2

## 2012-09-07 NOTE — ED Notes (Signed)
Patient complaining of headache, vomiting, abdominal pain, and weakness started today.

## 2012-09-07 NOTE — ED Provider Notes (Signed)
History     CSN: [id number]  Arrival date & time 09/07/12  0013   First MD Initiated Contact with Patient 09/07/12 0129      Chief Complaint  Patient presents with  . Headache  . Emesis    (Consider location/radiation/quality/duration/timing/severity/associated sxs/prior treatment) HPI Comments: Patient presents with n/v, headache, and abd cramping since this morning.  All has been non-bloody.  He denies fevers or chills.  No ill contacts.  Denies eating suspect foods that may have made him sick.  Patient is a 62 y.o. male presenting with vomiting. The history is provided by the patient.  Emesis  This is a new problem. Episode onset: this morning. The problem occurs continuously. The problem has not changed since onset.The emesis has an appearance of stomach contents. There has been no fever. Associated symptoms include chills. Pertinent negatives include no fever. Associated symptoms comments: abd cramping .    Past Medical History  Diagnosis Date  . Diverticulosis 2008 LGIB  . Gout   . HTN (hypertension)   . History of septic arthritis   . History of alcohol abuse   . Anemia FeDA: GASTRIC POLYPS, B12    TCS 2008 EGD 2009, 2008-HB 11.1 MCV 83.6 CR 1.22, 2009 FERRITIN 102-22  . Hepatomegaly 2o to FATTY LIVER DZ    Past Surgical History  Procedure Date  . Upper gastrointestinal endoscopy APR 2009    INFLAMED HYPERPLASTIC POLYPS, CHRONIC GASTRITIS  . Colonoscopy 2008 [facility] DJ    LGIB 2o to TICS, prep good  . Replacement total knee bilateral   . Hand surgery     Family History  Problem Relation Age of Onset  . Colon polyps Neg Hx   . Colon cancer Neg Hx     History  Substance Use Topics  . Smoking status: Former Smoker -- 0 years  . Smokeless tobacco: Not on file     Comment: Quit x 10 years/ never smoked on regular basis  . Alcohol Use: Yes      Review of Systems  Constitutional: Positive for chills. Negative for fever.  Gastrointestinal: Positive for  vomiting.  All other systems reviewed and are negative.    Allergies  Aspirin  Home Medications   Current Outpatient Rx  Name  Route  Sig  Dispense  Refill  . AMLODIPINE BESYLATE 10 MG PO TABS   Oral   Take 1 tablet (10 mg total) by mouth daily.   30 tablet   11   . AMLODIPINE BESYLATE 10 MG PO TABS   Oral   Take 10 mg by mouth daily.         . CYANOCOBALAMIN 1000 MCG/ML IJ SOLN   Intramuscular   Inject 1,000 mcg into the muscle every 30 (thirty) days.           . FEBUXOSTAT 40 MG PO TABS   Oral   Take 40 mg by mouth daily.          Marland Kitchen FOLIC ACID 1 MG PO TABS   Oral   Take 1 mg by mouth daily.           Marland Kitchen HYDROCODONE-ACETAMINOPHEN 5-325 MG PO TABS   Oral   Take 1 tablet by mouth every 4 (four) hours as needed for pain.   90 tablet   5   . POLYSACCHARIDE IRON COMPLEX 150 MG PO CAPS   Oral   Take 1 capsule (150 mg total) by mouth 2 (two) times daily.   60 each  11   . LEVOTHYROXINE SODIUM 75 MCG PO TABS   Oral   Take 100 mcg by mouth daily.          Marland Kitchen METOPROLOL SUCCINATE ER 25 MG PO TB24   Oral   Take 25 mg by mouth every morning.          Marland Kitchen OMEPRAZOLE 20 MG PO CPDR   Oral   Take 20 mg by mouth daily.           Marland Kitchen PROMETHAZINE HCL 25 MG PO TABS   Oral   Take 1 tablet (25 mg total) by mouth every 6 (six) hours as needed for nausea.   12 tablet   1     BP 168/104  Pulse 118  Temp 98.4 F (36.9 C) (Oral)  Resp 18  Ht 6' (1.829 m)  Wt 240 lb (108.863 kg)  BMI 32.55 kg/m2  SpO2 98%  Physical Exam  Nursing note and vitals reviewed. Constitutional: He is oriented to person, place, and time. He appears well-developed and well-nourished. No distress.  HENT:  Head: Normocephalic and atraumatic.  Mouth/Throat: Oropharynx is clear and moist.  Neck: Normal range of motion. Neck supple.  Cardiovascular: Normal rate and regular rhythm.   No murmur heard. Pulmonary/Chest: Effort normal and breath sounds normal. No respiratory distress.    Abdominal: Soft. Bowel sounds are normal. He exhibits no distension. There is no tenderness.  Musculoskeletal: Normal range of motion. He exhibits no edema.  Neurological: He is alert and oriented to person, place, and time.  Skin: Skin is warm and dry. He is not diaphoretic.    ED Course  Procedures (including critical care time)  Labs Reviewed - No data to display No results found.   1. Acute gastroenteritis       MDM  Was hydrated with ns, medicated with zofran and toradol and is feeling better.  The labs are reassuring.  This appears viral in nature.  Will discharge to home with phenergan, return prn.        Veryl Speak, MD 09/07/12 [phone]

## 2012-09-08 ENCOUNTER — Encounter (HOSPITAL_COMMUNITY): Payer: Self-pay | Admitting: Emergency Medicine

## 2012-09-08 ENCOUNTER — Emergency Department (HOSPITAL_COMMUNITY): Payer: Medicare Other

## 2012-09-08 ENCOUNTER — Emergency Department (HOSPITAL_COMMUNITY)
Admission: EM | Admit: 2012-09-08 | Discharge: 2012-09-08 | Disposition: A | Discharge status: Home / Self Care | Payer: Medicare Other | Attending: Emergency Medicine | Admitting: Emergency Medicine

## 2012-09-08 DIAGNOSIS — M25569 Pain in unspecified knee: Secondary | ICD-10-CM | POA: Insufficient documentation

## 2012-09-08 DIAGNOSIS — R197 Diarrhea, unspecified: Secondary | ICD-10-CM | POA: Insufficient documentation

## 2012-09-08 DIAGNOSIS — Z87891 Personal history of nicotine dependence: Secondary | ICD-10-CM | POA: Insufficient documentation

## 2012-09-08 DIAGNOSIS — R1084 Generalized abdominal pain: Secondary | ICD-10-CM | POA: Insufficient documentation

## 2012-09-08 DIAGNOSIS — Z8719 Personal history of other diseases of the digestive system: Secondary | ICD-10-CM | POA: Insufficient documentation

## 2012-09-08 DIAGNOSIS — Z79899 Other long term (current) drug therapy: Secondary | ICD-10-CM | POA: Insufficient documentation

## 2012-09-08 DIAGNOSIS — R109 Unspecified abdominal pain: Secondary | ICD-10-CM

## 2012-09-08 DIAGNOSIS — G8929 Other chronic pain: Secondary | ICD-10-CM | POA: Insufficient documentation

## 2012-09-08 DIAGNOSIS — M109 Gout, unspecified: Secondary | ICD-10-CM | POA: Insufficient documentation

## 2012-09-08 DIAGNOSIS — N390 Urinary tract infection, site not specified: Secondary | ICD-10-CM | POA: Insufficient documentation

## 2012-09-08 DIAGNOSIS — R112 Nausea with vomiting, unspecified: Secondary | ICD-10-CM | POA: Insufficient documentation

## 2012-09-08 DIAGNOSIS — A5909 Other urogenital trichomoniasis: Secondary | ICD-10-CM | POA: Insufficient documentation

## 2012-09-08 DIAGNOSIS — Z8739 Personal history of other diseases of the musculoskeletal system and connective tissue: Secondary | ICD-10-CM | POA: Insufficient documentation

## 2012-09-08 DIAGNOSIS — I10 Essential (primary) hypertension: Secondary | ICD-10-CM | POA: Insufficient documentation

## 2012-09-08 DIAGNOSIS — Z9889 Other specified postprocedural states: Secondary | ICD-10-CM | POA: Insufficient documentation

## 2012-09-08 DIAGNOSIS — A5903 Trichomonal cystitis and urethritis: Secondary | ICD-10-CM

## 2012-09-08 DIAGNOSIS — Z9089 Acquired absence of other organs: Secondary | ICD-10-CM | POA: Insufficient documentation

## 2012-09-08 HISTORY — DX: Other chronic pain: G89.29

## 2012-09-08 HISTORY — DX: Pain in unspecified knee: M25.569

## 2012-09-08 LAB — URINALYSIS, ROUTINE W REFLEX MICROSCOPIC
Nitrite: NEGATIVE
Specific Gravity, Urine: 1.025 (ref 1.005–1.030)
Urobilinogen, UA: 0.2 mg/dL (ref 0.0–1.0)
pH: 6 (ref 5.0–8.0)

## 2012-09-08 LAB — CBC WITH DIFFERENTIAL/PLATELET
Basophils Absolute: 0 10*3/uL (ref 0.0–0.1)
Eosinophils Relative: 1 % (ref 0–5)
Lymphocytes Relative: 32 % (ref 12–46)
Lymphs Abs: 1.5 10*3/uL (ref 0.7–4.0)
MCV: 92.9 fL (ref 78.0–100.0)
Neutro Abs: 2.5 10*3/uL (ref 1.7–7.7)
Neutrophils Relative %: 52 % (ref 43–77)
Platelets: 85 10*3/uL — ABNORMAL LOW (ref 150–400)
RBC: 4.82 MIL/uL (ref 4.22–5.81)
WBC: 4.8 10*3/uL (ref 4.0–10.5)

## 2012-09-08 LAB — URINE MICROSCOPIC-ADD ON

## 2012-09-08 LAB — BASIC METABOLIC PANEL
CO2: 25 mEq/L (ref 19–32)
Chloride: 93 mEq/L — ABNORMAL LOW (ref 96–112)
Glucose, Bld: 108 mg/dL — ABNORMAL HIGH (ref 70–99)
Potassium: 3.1 mEq/L — ABNORMAL LOW (ref 3.5–5.1)
Sodium: 134 mEq/L — ABNORMAL LOW (ref 135–145)

## 2012-09-08 LAB — HEPATIC FUNCTION PANEL
ALT: 42 U/L (ref 0–53)
AST: 93 U/L — ABNORMAL HIGH (ref 0–37)
Albumin: 3.9 g/dL (ref 3.5–5.2)
Bilirubin, Direct: 0.4 mg/dL — ABNORMAL HIGH (ref 0.0–0.3)
Indirect Bilirubin: 0.9 mg/dL (ref 0.3–0.9)
Total Bilirubin: 1.3 mg/dL — ABNORMAL HIGH (ref 0.3–1.2)

## 2012-09-08 LAB — LIPASE, BLOOD: Lipase: 76 U/L — ABNORMAL HIGH (ref 11–59)

## 2012-09-08 LAB — TROPONIN I: Troponin I: <0.3 ng/mL (ref <0.30)

## 2012-09-08 MED ORDER — METRONIDAZOLE 500 MG PO TABS
2000.0000 mg | ORAL_TABLET | Freq: Once | ORAL | Status: AC
  Administered 2012-09-08: 2000 mg via ORAL
  Filled 2012-09-08: qty 4

## 2012-09-08 MED ORDER — FENTANYL CITRATE 0.05 MG/ML IJ SOLN
50.0000 ug | INTRAMUSCULAR | Status: DC | PRN
  Administered 2012-09-08: 50 ug via INTRAVENOUS
  Filled 2012-09-08: qty 2

## 2012-09-08 MED ORDER — FAMOTIDINE IN NACL 20-0.9 MG/50ML-% IV SOLN
20.0000 mg | Freq: Once | INTRAVENOUS | Status: AC
  Administered 2012-09-08: 20 mg via INTRAVENOUS
  Filled 2012-09-08: qty 50

## 2012-09-08 MED ORDER — IOHEXOL 300 MG/ML  SOLN
100.0000 mL | Freq: Once | INTRAMUSCULAR | Status: AC | PRN
  Administered 2012-09-08: 100 mL via INTRAVENOUS

## 2012-09-08 MED ORDER — SODIUM CHLORIDE 0.9 % IV SOLN
INTRAVENOUS | Status: DC
  Administered 2012-09-08: 12:00:00 via INTRAVENOUS

## 2012-09-08 MED ORDER — CEPHALEXIN 500 MG PO CAPS: 500.0000 mg | ORAL_CAPSULE | Freq: Four times a day (QID) | ORAL | Status: DC

## 2012-09-08 MED ORDER — ONDANSETRON HCL 4 MG/2ML IJ SOLN
4.0000 mg | INTRAMUSCULAR | Status: DC | PRN
  Administered 2012-09-08: 4 mg via INTRAVENOUS
  Filled 2012-09-08: qty 2

## 2012-09-08 MED ORDER — POTASSIUM CHLORIDE 20 MEQ/15ML (10%) PO LIQD
40.0000 meq | Freq: Once | ORAL | Status: AC
  Administered 2012-09-08: 40 meq via ORAL
  Filled 2012-09-08: qty 30

## 2012-09-08 NOTE — ED Notes (Signed)
Pt seen yesterday for same. Pt states still feeling bad.

## 2012-09-08 NOTE — ED Provider Notes (Signed)
History     CSN: [id number]  Arrival date & time 09/08/12  [id number]   First MD Initiated Contact with Patient 09/08/12 1059      Chief Complaint  Patient presents with  . Emesis  . Abdominal Pain  . Diarrhea    HPI Pt was seen at 1110.   Per pt, c/o gradual onset and persistence of constant generalized abd "pain" for the past 2 days.  Has been associated with multiple intermittent episodes of N/V/D.  Describes the abd pain as "cramping."  Describes the diarrhea as "black stools" and "loose stools."  Pt was eval in the ED 2 days ago for same, states "the rx aren't working."  Denies fevers, no back pain, no rash, no CP/SOB, no black or blood in emesis, no blood in stools.       Past Medical History  Diagnosis Date  . Diverticulosis 2008 LGIB  . Gout   . HTN (hypertension)   . History of septic arthritis   . History of alcohol abuse   . Anemia FeDA: GASTRIC POLYPS, B12    TCS 2008 EGD 2009, 2008-HB 11.1 MCV 83.6 CR 1.22, 2009 FERRITIN 102-22  . Hepatomegaly 2o to FATTY LIVER DZ  . Chronic knee pain     Past Surgical History  Procedure Date  . Upper gastrointestinal endoscopy APR 2009    INFLAMED HYPERPLASTIC POLYPS, CHRONIC GASTRITIS  . Colonoscopy 2008 [facility] DJ    LGIB 2o to TICS, prep good  . Replacement total knee bilateral   . Hand surgery   . Cholecystectomy     Family History  Problem Relation Age of Onset  . Colon polyps Neg Hx   . Colon cancer Neg Hx     History  Substance Use Topics  . Smoking status: Former Smoker -- 0 years  . Smokeless tobacco: Not on file     Comment: Quit x 10 years/ never smoked on regular basis  . Alcohol Use: Yes      Review of Systems ROS: Statement: All systems negative except as marked or noted in the HPI; Constitutional: Negative for fever and chills. ; ; Eyes: Negative for eye pain, redness and discharge. ; ; ENMT: Negative for ear pain, hoarseness, nasal congestion, sinus pressure and sore throat. ; ; Cardiovascular: Negative  for chest pain, palpitations, diaphoresis, dyspnea and peripheral edema. ; ; Respiratory: Negative for cough, wheezing and stridor. ; ; Gastrointestinal: +N/V/D, abd pain,"black" stools. Negative for blood in stool, hematemesis, jaundice and rectal bleeding. . ; ; Genitourinary: Negative for dysuria, flank pain and hematuria. ; ; Musculoskeletal: Negative for back pain and neck pain. Negative for swelling and trauma.; ; Skin: Negative for pruritus, rash, abrasions, blisters, bruising and skin lesion.; ; Neuro: Negative for headache, lightheadedness and neck stiffness. Negative for weakness, altered level of consciousness , altered mental status, extremity weakness, paresthesias, involuntary movement, seizure and syncope.       Allergies  Aspirin  Home Medications   Current Outpatient Rx  Name  Route  Sig  Dispense  Refill  . AMLODIPINE BESYLATE 10 MG PO TABS   Oral   Take 10 mg by mouth daily.         . FEBUXOSTAT 40 MG PO TABS   Oral   Take 40 mg by mouth daily.          Marland Kitchen FOLIC ACID 1 MG PO TABS   Oral   Take 1 mg by mouth daily.           Marland Kitchen  HYDROCODONE-ACETAMINOPHEN 5-325 MG PO TABS   Oral   Take 1 tablet by mouth every 4 (four) hours as needed for pain.   90 tablet   5   . LEVOTHYROXINE SODIUM 75 MCG PO TABS   Oral   Take 75 mcg by mouth daily.          Marland Kitchen METOPROLOL SUCCINATE ER 25 MG PO TB24   Oral   Take 25 mg by mouth every morning.          Marland Kitchen OMEPRAZOLE 20 MG PO CPDR   Oral   Take 20 mg by mouth daily.           Marland Kitchen PROMETHAZINE HCL 25 MG PO TABS   Oral   Take 1 tablet (25 mg total) by mouth every 6 (six) hours as needed for nausea.   12 tablet   1   . AMLODIPINE BESYLATE 10 MG PO TABS   Oral   Take 1 tablet (10 mg total) by mouth daily.   30 tablet   11   . CYANOCOBALAMIN 1000 MCG/ML IJ SOLN   Intramuscular   Inject 1,000 mcg into the muscle every 30 (thirty) days.             BP 124/86  Pulse 123  Temp 98.5 F (36.9 C) (Oral)  Resp  22  Ht 6' 0.5" (1.842 m)  Wt 240 lb (108.863 kg)  BMI 32.10 kg/m2  SpO2 96%  Physical Exam 1115: Physical examination:  Nursing notes reviewed; Vital signs and O2 SAT reviewed;  Constitutional: Well developed, Well nourished, In no acute distress; Head:  Normocephalic, atraumatic; Eyes: EOMI, PERRL, No scleral icterus; ENMT: Mouth and pharynx normal, Mucous membranes dry; Neck: Supple, Full range of motion, No lymphadenopathy; Cardiovascular: Tachycardic rate and rhythm, No gallop; Respiratory: Breath sounds clear & equal bilaterally, No rales, rhonchi, wheezes.  Speaking full sentences with ease, Normal respiratory effort/excursion; Chest: Nontender, Movement normal; Abdomen: Soft, +mild diffuse tenderness to palp. No rebound or guarding. Nondistended, Normal bowel sounds. Rectal exam performed w/permission of pt and ED RN chaparone present.  Anal tone normal.  Non-tender, soft yellow stool in rectal vault, heme neg.  No fissures, no external hemorrhoids, no palp masses.;; Genitourinary: No CVA tenderness; Extremities: Pulses normal, No tenderness, No edema, No calf edema or asymmetry.; Neuro: AA&Ox3, vague historian. Major CN grossly intact.  Speech clear. No gross focal motor or sensory deficits in extremities.; Skin: Color normal, Warm, Dry.   ED Course  Procedures    MDM  MDM Reviewed: previous chart, nursing note and vitals Reviewed previous: ECG and labs Interpretation: ECG, labs, x-ray and CT scan    Date: 09/08/2012  Rate: 123  Rhythm: sinus tachycardia  QRS Axis: left  Intervals: normal  ST/T Wave abnormalities: nonspecific T wave changes  Conduction Disutrbances:none  Narrative Interpretation:   Old EKG Reviewed: unchanged; rate faster, otherwise no significant changes from previous EKG dated 10/17/2008.  Results for orders placed during the hospital encounter of 09/08/12  CBC WITH DIFFERENTIAL      Component Value Range   WBC 4.8  4.0 - 10.5 K/uL   RBC 4.82  4.22 - 5.81  MIL/uL   Hemoglobin 15.1  13.0 - 17.0 g/dL   HCT 44.8  39.0 - 52.0 %   MCV 92.9  78.0 - 100.0 fL   MCH 31.3  26.0 - 34.0 pg   MCHC 33.7  30.0 - 36.0 g/dL   RDW 14.0  11.5 - 15.5 %  Platelets 85 (*) 150 - 400 K/uL   Neutrophils Relative 52  43 - 77 %   Neutro Abs 2.5  1.7 - 7.7 K/uL   Lymphocytes Relative 32  12 - 46 %   Lymphs Abs 1.5  0.7 - 4.0 K/uL   Monocytes Relative 15 (*) 3 - 12 %   Monocytes Absolute 0.7  0.1 - 1.0 K/uL   Eosinophils Relative 1  0 - 5 %   Eosinophils Absolute 0.0  0.0 - 0.7 K/uL   Basophils Relative 0  0 - 1 %   Basophils Absolute 0.0  0.0 - 0.1 K/uL  BASIC METABOLIC PANEL      Component Value Range   Sodium 134 (*) 135 - 145 mEq/L   Potassium 3.1 (*) 3.5 - 5.1 mEq/L   Chloride 93 (*) 96 - 112 mEq/L   CO2 25  19 - 32 mEq/L   Glucose, Bld 108 (*) 70 - 99 mg/dL   BUN 6  6 - 23 mg/dL   Creatinine, Ser 1.29  0.50 - 1.35 mg/dL   Calcium 9.2  8.4 - 10.5 mg/dL   GFR calc non Af Amer 57 (*) >90 mL/min   GFR calc Af Amer 67 (*) >90 mL/min  TROPONIN I      Component Value Range   Troponin I <0.30  <0.30 ng/mL  LIPASE, BLOOD      Component Value Range   Lipase 76 (*) 11 - 59 U/L  URINALYSIS, ROUTINE W REFLEX MICROSCOPIC      Component Value Range   Color, Urine YELLOW  YELLOW   APPearance CLEAR  CLEAR   Specific Gravity, Urine 1.025  1.005 - 1.030   pH 6.0  5.0 - 8.0   Glucose, UA NEGATIVE  NEGATIVE mg/dL   Hgb urine dipstick SMALL (*) NEGATIVE   Bilirubin Urine MODERATE (*) NEGATIVE   Ketones, ur 15 (*) NEGATIVE mg/dL   Protein, ur 100 (*) NEGATIVE mg/dL   Urobilinogen, UA 0.2  0.0 - 1.0 mg/dL   Nitrite NEGATIVE  NEGATIVE   Leukocytes, UA SMALL (*) NEGATIVE  ETHANOL      Component Value Range   Alcohol, Ethyl (B) <11  0 - 11 mg/dL  HEPATIC FUNCTION PANEL      Component Value Range   Total Protein 8.8 (*) 6.0 - 8.3 g/dL   Albumin 3.9  3.5 - 5.2 g/dL   AST 93 (*) 0 - 37 U/L   ALT 42  0 - 53 U/L   Alkaline Phosphatase 60  39 - 117 U/L   Total  Bilirubin 1.3 (*) 0.3 - 1.2 mg/dL   Bilirubin, Direct 0.4 (*) 0.0 - 0.3 mg/dL   Indirect Bilirubin 0.9  0.3 - 0.9 mg/dL  URINE MICROSCOPIC-ADD ON      Component Value Range   Squamous Epithelial / LPF FEW (*) RARE   WBC, UA TOO NUMEROUS TO COUNT  <3 WBC/hpf   RBC / HPF 3-6  <3 RBC/hpf   Bacteria, UA MANY (*) RARE   Urine-Other TRICHOMONAS PRESENT     Dg Chest 2 View 09/08/2012  *RADIOLOGY REPORT*  Clinical Data: Cough, fever, nausea, vomiting, abdominal pain, diarrhea  CHEST - 2 VIEW  Comparison: 10/25/2011  Findings: Upper-normal size of cardiac silhouette. Tortuous aorta. Pulmonary vascularity normal. Minimal chronic right basilar atelectasis. Question mild underlying emphysematous changes. Probable left nipple shadow; this corresponds in position to a nipple marker present on earlier exam of 03/08/2010. No pulmonary infiltrate, pleural effusion or  pneumothorax. Minor scattered endplate spur formation thoracic spine.  IMPRESSION: Minimal chronic right basilar atelectasis and question underlying emphysematous changes.   Original Report Authenticated By: Lavonia Dana, M.D.    Ct Abdomen Pelvis W Contrast 09/08/2012  *RADIOLOGY REPORT*  Clinical Data: Abdominal pain, nausea/vomiting, prior cholecystectomy  CT ABDOMEN AND PELVIS WITH CONTRAST  Technique:  Multidetector CT imaging of the abdomen and pelvis was performed following the standard protocol during bolus administration of intravenous contrast.  Contrast: 149mL OMNIPAQUE IOHEXOL 300 MG/ML  SOLN  Comparison: 08/26/2007  Findings: 5 x 3 mm nodule in the left lower lobe (series 3/image 1), unchanged 2008, benign.  Moderate hepatic steatosis.  Spleen, pancreas, and adrenal glands are unremarkable.  No peripancreatic inflammatory changes by CT.  Status post cholecystectomy.  No intrahepatic or extrahepatic ductal dilatation.  Kidneys are within normal limits.  No hydronephrosis.  No evidence of bowel obstruction.  Normal appendix.  Colonic diverticulosis,  without associated inflammatory changes.  Atherosclerotic calcifications of the abdominal aorta and branch vessels.  No abdominopelvic ascites.  No suspicious pelvic lymphadenopathy.  Mild prostatomegaly, measuring 5.0 cm in transverse dimension.  Bladder is thick-walled although underdistended.  Sebaceous cyst in the left anterior abdominal wall (series 2/image 59).  Degenerative changes of the visualized thoracolumbar spine.  IMPRESSION: Normal appendix.  No evidence of bowel obstruction.  No peripancreatic inflammatory changes by CT.  Thick-walled bladder, correlate for cystitis.   Original Report Authenticated By: Julian Hy, M.D.       1530:  Treated for trichomonas while in the ED.  Potassium repleted PO.  +UTI, will tx with keflex while UC pending.  Labs similar to previous.  Lipase non-specifically elevated, no pancreatitis on CT scan.  Pt has tol PO well while in the ED without N/V.  No stooling while in the ED.  Abd benign on re-exam, VSS.  Wants to go home now. Already has rx for anti-emetic. Dx and testing d/w pt.  Questions answered.  Verb understanding, agreeable to d/c home with outpt f/u.               Alfonzo Feller, DO 09/10/12 2151

## 2012-09-08 NOTE — ED Notes (Signed)
Reports was seen and tx at this ED for same 2 days ago; states he is not feeling any better, even though he is taking the Rx meds.  C/o n/v - reports last vomited just prior to arrival.

## 2012-09-08 NOTE — ED Notes (Signed)
Patient unable to void at this time. Patient is aware to collect a sample when he can.

## 2012-09-10 LAB — URINE CULTURE

## 2012-12-19 ENCOUNTER — Other Ambulatory Visit: Payer: Self-pay | Admitting: *Deleted

## 2012-12-19 DIAGNOSIS — G8929 Other chronic pain: Secondary | ICD-10-CM

## 2012-12-19 MED ORDER — HYDROCODONE-ACETAMINOPHEN 5-325 MG PO TABS: 1.0000 | ORAL_TABLET | ORAL | Status: DC | PRN

## 2013-02-15 ENCOUNTER — Encounter: Payer: Self-pay | Admitting: Gastroenterology

## 2013-03-02 ENCOUNTER — Encounter: Payer: Self-pay | Admitting: Gastroenterology

## 2013-03-06 ENCOUNTER — Telehealth: Payer: Self-pay | Admitting: Gastroenterology

## 2013-03-06 ENCOUNTER — Ambulatory Visit: Payer: Medicare Other | Admitting: Gastroenterology

## 2013-03-06 NOTE — Telephone Encounter (Signed)
Pt called to [facility] due to transportation problems

## 2013-03-06 NOTE — Telephone Encounter (Signed)
Pt was a no show

## 2013-03-06 NOTE — Telephone Encounter (Signed)
Please send note for follow-up

## 2013-03-22 ENCOUNTER — Encounter: Payer: Self-pay | Admitting: Gastroenterology

## 2013-03-22 ENCOUNTER — Ambulatory Visit (INDEPENDENT_AMBULATORY_CARE_PROVIDER_SITE_OTHER): Payer: Medicare Other | Admitting: Gastroenterology

## 2013-03-22 VITALS — BP 136/83 | HR 85 | Temp 98.6°F | Ht 72.5 in | Wt 253.0 lb

## 2013-03-22 DIAGNOSIS — Z862 Personal history of diseases of the blood and blood-forming organs and certain disorders involving the immune mechanism: Secondary | ICD-10-CM

## 2013-03-22 DIAGNOSIS — D696 Thrombocytopenia, unspecified: Secondary | ICD-10-CM

## 2013-03-22 DIAGNOSIS — K7689 Other specified diseases of liver: Secondary | ICD-10-CM

## 2013-03-22 DIAGNOSIS — F1010 Alcohol abuse, uncomplicated: Secondary | ICD-10-CM

## 2013-03-22 NOTE — Progress Notes (Signed)
Primary Care Physician: [name], MD  Primary Gastroenterologist:  Barney Drain, MD   Chief Complaint  Patient presents with  . Follow-up    HPI: [name] is a 62 y.o. male here for f/u. H/O IDA, fatty liver, etoh abuse. Patient was last seen in June 2013. Since his last office visit he was in the emergency department back in January with abdominal pain. Lipase is 76 slightly elevated. His AST was up at 93, ALT was 42, total bilirubin 1.3, direct bilirubin 0.4. Alcohol level less than 11. His hemoglobin was 15.1, platelet count 85,000. He had a CT of the abdomen pelvis with contrast showed no peripancreatic inflammatory changes. Positive for hepatic steatosis.   Patient states he's had followup blood work through Dr. Pearline Cables office. We have requested records. He denies abdominal pain, vomiting, constipation, diarrhea, melena, rectal bleeding, heartburn, dysphagia, weight loss. Appetite is good. States he consumes about one fifth of liquor on the weekends. In the remote past drank on a daily basis.   Current Outpatient Prescriptions  Medication Sig Dispense Refill  . amLODipine (NORVASC) 10 MG tablet Take 10 mg by mouth daily.      . cyanocobalamin (,VITAMIN B-12,) 1000 MCG/ML injection Inject 1,000 mcg into the muscle every 30 (thirty) days.        . febuxostat (ULORIC) 40 MG tablet Take 40 mg by mouth daily.       . folic acid (FOLVITE) 1 MG tablet Take 1 mg by mouth daily.        Marland Kitchen HYDROcodone-acetaminophen (NORCO) 5-325 MG per tablet Take 1 tablet by mouth every 4 (four) hours as needed for pain.  90 tablet  5  . levothyroxine (SYNTHROID, LEVOTHROID) 88 MCG tablet Take 88 mcg by mouth daily before breakfast.       . metoprolol succinate (TOPROL-XL) 25 MG 24 hr tablet Take 25 mg by mouth every morning.       Marland Kitchen omeprazole (PRILOSEC) 20 MG capsule Take 20 mg by mouth daily.         No current facility-administered medications for this visit.    Allergies as of 03/22/2013 -  Review Complete 03/22/2013  Allergen Reaction Noted  . Aspirin  06/01/2007    ROS:  General: Negative for anorexia, weight loss, fever, chills, fatigue, weakness. ENT: Negative for hoarseness, difficulty swallowing , nasal congestion. CV: Negative for chest pain, angina, palpitations, dyspnea on exertion, peripheral edema.  Respiratory: Negative for dyspnea at rest, dyspnea on exertion, cough, sputum, wheezing.  GI: See history of present illness. GU:  Negative for dysuria, hematuria, urinary incontinence, urinary frequency, nocturnal urination.  Endo: Negative for unusual weight change.    Physical Examination:   BP 136/83  Pulse 85  Temp(Src) 98.6 F (37 C) (Oral)  Ht 6' 0.5" (1.842 m)  Wt 253 lb (114.76 kg)  BMI 33.82 kg/m2  General: Well-nourished, well-developed in no acute distress.  Eyes: No icterus. Mouth: Oropharyngeal mucosa moist and pink , no lesions erythema or exudate. Lungs: Clear to auscultation bilaterally.  Heart: Regular rate and rhythm, no murmurs rubs or gallops.  Abdomen: Bowel sounds are normal, nontender, nondistended, no hepatosplenomegaly or masses, no abdominal bruits or hernia , no rebound or guarding.   Extremities: No lower extremity edema. No clubbing or deformities. Neuro: Alert and oriented x 4   Skin: Warm and dry, no jaundice.   Psych: Alert and cooperative, normal mood and affect.  Labs:  Lab Results  Component Value Date   WBC 4.8  09/08/2012   HGB 15.1 09/08/2012   HCT 44.8 09/08/2012   MCV 92.9 09/08/2012   PLT 85* 09/08/2012   Lab Results  Component Value Date   CREATININE 1.29 09/08/2012   BUN 6 09/08/2012   NA 134* 09/08/2012   K 3.1* 09/08/2012   CL 93* 09/08/2012   CO2 25 09/08/2012   Lab Results  Component Value Date   ALT 42 09/08/2012   AST 93* 09/08/2012   ALKPHOS 60 09/08/2012   BILITOT 1.3* 09/08/2012   Lab Results  Component Value Date   LIPASE 76* 09/08/2012    Imaging Studies: CT of the abdomen and pelvis with contrast back in  January 2014 showed moderate hepatic steatosis. No peripancreatic inflammatory changes on CT.

## 2013-03-22 NOTE — Assessment & Plan Note (Signed)
Last H/H normal.

## 2013-03-22 NOTE — Assessment & Plan Note (Signed)
Thrombocytopenia with out evidence of splenomegaly or cirrhosis on recent CT. Followup on recent labs from PCP. Further recommendations to follow.

## 2013-03-22 NOTE — Progress Notes (Signed)
Cc PCP 

## 2013-03-22 NOTE — Patient Instructions (Addendum)
1. Continue to decrease your alcohol consumption. 2. I will review most recent labs from Dr. Pearline Cables office. If you need further lab work we will let you know. 3. Office visit in one year.

## 2013-03-22 NOTE — Assessment & Plan Note (Signed)
Weight is stable. No further intentional weight loss. AST up in January likely related to alcohol use. Hepatic steatosis on CT. Discussed need to continue to decrease alcohol consumption. Request latest LFTs from PCP. Office visit in one year.  Instructions for fatty liver: Recommend 1-2# weight loss per week until ideal body weight through exercise & diet. Low fat/cholesterol diet.   Avoid sweets, sodas, fruit juices, sweetened beverages like tea, etc. Gradually increase exercise from 15 min daily up to 1 hr per day 5 days/week. Limit alcohol use.

## 2013-04-05 ENCOUNTER — Encounter: Payer: Self-pay | Admitting: Gastroenterology

## 2013-04-05 NOTE — Progress Notes (Signed)
LMOM to call. Lab order faxed to [facility].  

## 2013-04-05 NOTE — Addendum Note (Signed)
Addended by: Mahala Menghini on: 04/05/2013 02:44 PM   Modules accepted: Orders

## 2013-04-05 NOTE — Progress Notes (Signed)
Please let pt know I reviewed labs from PCP. 12/2012.  H/H 14.4/44.1, Platelets 148,000. No LFTs done.  Recommend LFTs, lipase at this time.

## 2013-04-06 LAB — CBC
HCT: 44 %
TSH: 9.804

## 2013-04-06 NOTE — Progress Notes (Signed)
LMOM for a call. Mailing a letter to call also.

## 2013-05-30 ENCOUNTER — Other Ambulatory Visit: Payer: Self-pay | Admitting: Orthopedic Surgery

## 2013-05-30 DIAGNOSIS — G8929 Other chronic pain: Secondary | ICD-10-CM

## 2013-05-30 MED ORDER — HYDROCODONE-ACETAMINOPHEN 5-325 MG PO TABS: 1.0000 | ORAL_TABLET | ORAL | Status: DC | PRN

## 2013-06-09 ENCOUNTER — Telehealth: Payer: Self-pay | Admitting: Gastroenterology

## 2013-06-09 NOTE — Telephone Encounter (Signed)
Please remind patient to have his labs done

## 2013-06-14 NOTE — Telephone Encounter (Signed)
LMOM at home and letter mailed to remind of labs.

## 2013-06-14 NOTE — Telephone Encounter (Signed)
Routing to [name], this is a [facility] pt. 

## 2013-08-14 ENCOUNTER — Telehealth: Payer: Self-pay | Admitting: Orthopedic Surgery

## 2013-08-14 NOTE — Telephone Encounter (Signed)
[name] wants a Hydrocodone prescription  [phone]

## 2013-08-15 ENCOUNTER — Other Ambulatory Visit: Payer: Self-pay | Admitting: Orthopedic Surgery

## 2013-08-15 DIAGNOSIS — G8929 Other chronic pain: Secondary | ICD-10-CM

## 2013-08-15 MED ORDER — HYDROCODONE-ACETAMINOPHEN 5-325 MG PO TABS: 1.0000 | ORAL_TABLET | ORAL | Status: DC | PRN

## 2013-08-16 NOTE — Telephone Encounter (Signed)
Prescription picked up by the patient

## 2013-08-28 ENCOUNTER — Encounter (HOSPITAL_COMMUNITY): Payer: Self-pay | Admitting: Emergency Medicine

## 2013-08-28 ENCOUNTER — Emergency Department (HOSPITAL_COMMUNITY)
Admission: EM | Admit: 2013-08-28 | Discharge: 2013-08-28 | Disposition: A | Discharge status: Home / Self Care | Payer: Medicare Other | Attending: Emergency Medicine | Admitting: Emergency Medicine

## 2013-08-28 DIAGNOSIS — Z862 Personal history of diseases of the blood and blood-forming organs and certain disorders involving the immune mechanism: Secondary | ICD-10-CM | POA: Insufficient documentation

## 2013-08-28 DIAGNOSIS — K047 Periapical abscess without sinus: Secondary | ICD-10-CM | POA: Insufficient documentation

## 2013-08-28 DIAGNOSIS — M109 Gout, unspecified: Secondary | ICD-10-CM | POA: Insufficient documentation

## 2013-08-28 DIAGNOSIS — Z87891 Personal history of nicotine dependence: Secondary | ICD-10-CM | POA: Insufficient documentation

## 2013-08-28 DIAGNOSIS — G8929 Other chronic pain: Secondary | ICD-10-CM | POA: Insufficient documentation

## 2013-08-28 DIAGNOSIS — Z79899 Other long term (current) drug therapy: Secondary | ICD-10-CM | POA: Insufficient documentation

## 2013-08-28 DIAGNOSIS — I10 Essential (primary) hypertension: Secondary | ICD-10-CM | POA: Insufficient documentation

## 2013-08-28 DIAGNOSIS — M009 Pyogenic arthritis, unspecified: Secondary | ICD-10-CM | POA: Insufficient documentation

## 2013-08-28 DIAGNOSIS — K029 Dental caries, unspecified: Secondary | ICD-10-CM | POA: Insufficient documentation

## 2013-08-28 MED ORDER — HYDROCODONE-ACETAMINOPHEN 5-325 MG PO TABS: 1.0000 | ORAL_TABLET | ORAL | Status: DC | PRN

## 2013-08-28 MED ORDER — AMOXICILLIN 500 MG PO CAPS: 500.0000 mg | ORAL_CAPSULE | Freq: Three times a day (TID) | ORAL | Status: AC

## 2013-08-28 MED ORDER — HYDROCODONE-ACETAMINOPHEN 5-325 MG PO TABS
1.0000 | ORAL_TABLET | Freq: Once | ORAL | Status: AC
  Administered 2013-08-28: 1 via ORAL
  Filled 2013-08-28: qty 1

## 2013-08-28 MED ORDER — AMOXICILLIN 250 MG PO CAPS
500.0000 mg | ORAL_CAPSULE | Freq: Once | ORAL | Status: AC
  Administered 2013-08-28: 500 mg via ORAL
  Filled 2013-08-28: qty 2

## 2013-08-28 NOTE — ED Notes (Signed)
Pt verbalized understanding of d/c instructions and the importance of taking all antibiotics and no driving after taking pain meds

## 2013-08-28 NOTE — ED Notes (Signed)
Dental pain to left lower side x 2 days.

## 2013-08-28 NOTE — ED Notes (Signed)
J. Idol, PA at bedside. 

## 2013-08-28 NOTE — ED Notes (Signed)
Swelling noted to left jaw, per pt since this weekend

## 2013-08-30 NOTE — ED Provider Notes (Signed)
CSN: [id number]     Arrival date & time 08/28/13  1038 History   First MD Initiated Contact with Patient 08/28/13 1222     Chief Complaint  Patient presents with  . Dental Pain   (Consider location/radiation/quality/duration/timing/severity/associated sxs/prior Treatment) HPI Comments: [name] is a 62 y.o. Male presenting with a 2 day history of dental pain and gingival swelling.   The patient has a history of decay in multiple teeth, including the one involved which has recently started to cause pain.  There has been no fevers,  Chills, nausea or vomiting, also no complaint of difficulty swallowing,  Although chewing makes pain worse.  The patient has tried no medicines prior to arrival.      The history is provided by the patient.    Past Medical History  Diagnosis Date  . Diverticulosis 2008 LGIB  . Gout   . HTN (hypertension)   . History of septic arthritis   . History of alcohol abuse   . Anemia FeDA: GASTRIC POLYPS, B12    TCS 2008 EGD 2009, 2008-HB 11.1 MCV 83.6 CR 1.22, 2009 FERRITIN 102-22  . Hepatomegaly 2o to FATTY LIVER DZ  . Chronic knee pain   . B12 deficiency    Past Surgical History  Procedure Laterality Date  . Upper gastrointestinal endoscopy  APR 2009    INFLAMED HYPERPLASTIC POLYPS, CHRONIC GASTRITIS  . Colonoscopy  2008 [facility] DJ    LGIB 2o to TICS, prep good  . Replacement total knee bilateral    . Hand surgery    . Cholecystectomy    . Esophagogastroduodenoscopy  12/08/2007    [id number] gastric polyps seen in the cardia and body of the stomach/Normal esophagus without evidence of Barrett's, mass, erosion/Normal duodenal bulb and second portion of the duodenum. Benign bx.   Family History  Problem Relation Age of Onset  . Colon polyps Neg Hx   . Colon cancer Neg Hx   . Pancreatic disease Neg Hx    History  Substance Use Topics  . Smoking status: Former Smoker -- 0 years  . Smokeless tobacco: Not on file     Comment: Quit x 10 years/ never  smoked on regular basis  . Alcohol Use: Yes     Comment: drinks on weekends, gin/vodka 1/5th. heavier in remote past.    Review of Systems  Constitutional: Negative for fever.  HENT: Positive for dental problem and facial swelling. Negative for sore throat.   Respiratory: Negative for shortness of breath.   Musculoskeletal: Negative for neck pain and neck stiffness.    Allergies  Aspirin  Home Medications   Current Outpatient Rx  Name  Route  Sig  Dispense  Refill  . amLODipine (NORVASC) 10 MG tablet   Oral   Take 10 mg by mouth daily.         . cyanocobalamin (,VITAMIN B-12,) 1000 MCG/ML injection   Intramuscular   Inject 1,000 mcg into the muscle every 30 (thirty) days.           . febuxostat (ULORIC) 40 MG tablet   Oral   Take 40 mg by mouth daily.          Marland Kitchen HYDROcodone-acetaminophen (NORCO) 5-325 MG per tablet   Oral   Take 1 tablet by mouth every 4 (four) hours as needed.   180 tablet   0   . levothyroxine (SYNTHROID, LEVOTHROID) 100 MCG tablet   Oral   Take 100 mcg by mouth daily.         Marland Kitchen  metoprolol succinate (TOPROL-XL) 25 MG 24 hr tablet   Oral   Take 25 mg by mouth every morning.          Marland Kitchen omeprazole (PRILOSEC) 20 MG capsule   Oral   Take 20 mg by mouth daily.           . tamsulosin (FLOMAX) 0.4 MG CAPS capsule   Oral   Take 0.4 mg by mouth daily.         Marland Kitchen amoxicillin (AMOXIL) 500 MG capsule   Oral   Take 1 capsule (500 mg total) by mouth 3 (three) times daily.   30 capsule   0   . HYDROcodone-acetaminophen (NORCO/VICODIN) 5-325 MG per tablet   Oral   Take 1 tablet by mouth every 4 (four) hours as needed for moderate pain.   15 tablet   0    BP 135/80  Pulse 107  Temp(Src) 99.8 F (37.7 C) (Oral)  Resp 16  Ht 6\' 5"  (1.956 m)  Wt 244 lb (110.678 kg)  BMI 28.93 kg/m2  SpO2 94% Physical Exam  Constitutional: He is oriented to person, place, and time. He appears well-developed and well-nourished. No distress.  HENT:    Head: Normocephalic and atraumatic.  Right Ear: Tympanic membrane and external ear normal.  Left Ear: Tympanic membrane and external ear normal.  Mouth/Throat: Oropharynx is clear and moist and mucous membranes are normal. No trismus in the jaw. Dental abscesses and dental caries present.    Ttp, edema with fluctuance along left lower gingiva at premolars.  Moderate diffuse dental decay.  Eyes: Conjunctivae are normal.  Neck: Normal range of motion. Neck supple.  Cardiovascular: Normal rate and normal heart sounds.   Pulmonary/Chest: Effort normal.  Abdominal: He exhibits no distension.  Musculoskeletal: Normal range of motion.  Lymphadenopathy:    He has no cervical adenopathy.  Neurological: He is alert and oriented to person, place, and time.  Skin: Skin is warm and dry. No erythema.  Psychiatric: He has a normal mood and affect.    ED Course  Procedures (including critical care time)  INCISION AND DRAINAGE Performed by: Evalee Jefferson Consent: Verbal consent obtained. Risks and benefits: risks, benefits and alternatives were discussed Type: abscess  Body area: left lower gingiva  Anesthesia: left dental block Incision was made with a scalpel.  Local anesthetic: marcaine 0.5%,  1 mL  Complexity: complex Blunt dissection to break up loculations  Drainage: purulent  Drainage amount: mostly bloody drainage,  Scant purulence  Packing material: na  Patient tolerance: Patient tolerated the procedure well with no immediate complications.    Labs Review Labs Reviewed - No data to display Imaging Review No results found.  EKG Interpretation   None       MDM   1. Dental abscess    Dental referrals given, Pt prescribed amoxil,  Hydrocodone prescribed.    The patient appears reasonably screened and/or stabilized for discharge and I doubt any other medical condition or other [facility] requiring further screening, evaluation, or treatment in the ED at this time prior  to discharge.     Evalee Jefferson, PA-C 08/30/13 1006

## 2013-08-30 NOTE — ED Provider Notes (Signed)
History/physical exam/procedure(s) were performed by non-physician practitioner and as supervising physician I was immediately available for consultation/collaboration. I have reviewed all notes and am in agreement with care and plan.   Shaune Pollack, MD 08/30/13 [phone]

## 2013-10-19 ENCOUNTER — Telehealth: Payer: Self-pay | Admitting: Orthopedic Surgery

## 2013-10-19 NOTE — Telephone Encounter (Signed)
[name] wants a prescription for Hydrocodone 5/325

## 2013-10-19 NOTE — Telephone Encounter (Signed)
Routing to Dr Harrison 

## 2013-10-20 ENCOUNTER — Other Ambulatory Visit: Payer: Self-pay | Admitting: *Deleted

## 2013-10-20 DIAGNOSIS — G8929 Other chronic pain: Secondary | ICD-10-CM

## 2013-10-20 MED ORDER — HYDROCODONE-ACETAMINOPHEN 5-325 MG PO TABS: 1.0000 | ORAL_TABLET | ORAL | Status: DC | PRN

## 2013-10-20 NOTE — Telephone Encounter (Signed)
yes

## 2013-10-20 NOTE — Telephone Encounter (Signed)
Refilled per Dr. Aline Brochure, and advised patient he could pick up Monday after Dr. Aline Brochure signs it.

## 2013-10-23 NOTE — Telephone Encounter (Signed)
Prescription picked up by the patient

## 2013-11-14 NOTE — Progress Notes (Signed)
REVIEWED.  

## 2013-12-18 ENCOUNTER — Telehealth: Payer: Self-pay | Admitting: Orthopedic Surgery

## 2013-12-18 NOTE — Telephone Encounter (Signed)
[name] asked for a prescription for Hydrocodone 5/325 His # [phone]

## 2013-12-18 NOTE — Telephone Encounter (Signed)
Routing to Dr Harrison 

## 2013-12-19 ENCOUNTER — Other Ambulatory Visit: Payer: Self-pay | Admitting: Orthopedic Surgery

## 2013-12-19 DIAGNOSIS — G8929 Other chronic pain: Secondary | ICD-10-CM

## 2013-12-19 MED ORDER — HYDROCODONE-ACETAMINOPHEN 5-325 MG PO TABS: 1.0000 | ORAL_TABLET | ORAL | Status: DC | PRN

## 2013-12-20 NOTE — Telephone Encounter (Signed)
Patient picked up prescription.

## 2014-02-05 ENCOUNTER — Other Ambulatory Visit: Payer: Self-pay | Admitting: *Deleted

## 2014-02-05 ENCOUNTER — Telehealth: Payer: Self-pay | Admitting: Orthopedic Surgery

## 2014-02-05 DIAGNOSIS — G8929 Other chronic pain: Secondary | ICD-10-CM

## 2014-02-05 MED ORDER — HYDROCODONE-ACETAMINOPHEN 5-325 MG PO TABS: 1.0000 | ORAL_TABLET | ORAL | Status: DC | PRN

## 2014-02-05 NOTE — Telephone Encounter (Signed)
yes

## 2014-02-05 NOTE — Telephone Encounter (Signed)
Patient informed prescription is ready for pick up.

## 2014-02-05 NOTE — Telephone Encounter (Signed)
Routing to Dr Harrison 

## 2014-02-05 NOTE — Telephone Encounter (Signed)
Patient called and requested a refill on hydrocodone. Please Advise.

## 2014-03-26 ENCOUNTER — Other Ambulatory Visit: Payer: Self-pay | Admitting: Orthopedic Surgery

## 2014-03-26 ENCOUNTER — Telehealth: Payer: Self-pay | Admitting: Orthopedic Surgery

## 2014-03-26 DIAGNOSIS — G8929 Other chronic pain: Secondary | ICD-10-CM

## 2014-03-26 MED ORDER — HYDROCODONE-ACETAMINOPHEN 5-325 MG PO TABS: 1.0000 | ORAL_TABLET | ORAL | Status: DC | PRN

## 2014-03-26 NOTE — Telephone Encounter (Signed)
Routing to Dr Harrison 

## 2014-03-26 NOTE — Telephone Encounter (Signed)
[name] wants a prescription for Hydrocodone 5/325  His # [phone]

## 2014-03-27 NOTE — Telephone Encounter (Signed)
Prescription available for pickup, patient aware

## 2014-03-28 NOTE — Telephone Encounter (Signed)
Prescription picked up by the patient

## 2014-03-29 ENCOUNTER — Encounter: Payer: Self-pay | Admitting: Gastroenterology

## 2014-05-14 ENCOUNTER — Ambulatory Visit (INDEPENDENT_AMBULATORY_CARE_PROVIDER_SITE_OTHER): Payer: Medicare Other | Admitting: Gastroenterology

## 2014-05-14 ENCOUNTER — Other Ambulatory Visit: Payer: Self-pay | Admitting: *Deleted

## 2014-05-14 ENCOUNTER — Telehealth: Payer: Self-pay | Admitting: Orthopedic Surgery

## 2014-05-14 ENCOUNTER — Encounter: Payer: Self-pay | Admitting: Gastroenterology

## 2014-05-14 VITALS — BP 134/81 | HR 103 | Temp 98.1°F | Ht 73.0 in | Wt 237.4 lb

## 2014-05-14 DIAGNOSIS — R7989 Other specified abnormal findings of blood chemistry: Secondary | ICD-10-CM

## 2014-05-14 DIAGNOSIS — G8929 Other chronic pain: Secondary | ICD-10-CM

## 2014-05-14 DIAGNOSIS — R945 Abnormal results of liver function studies: Secondary | ICD-10-CM | POA: Insufficient documentation

## 2014-05-14 DIAGNOSIS — K219 Gastro-esophageal reflux disease without esophagitis: Secondary | ICD-10-CM

## 2014-05-14 DIAGNOSIS — K7689 Other specified diseases of liver: Secondary | ICD-10-CM

## 2014-05-14 DIAGNOSIS — D472 Monoclonal gammopathy: Secondary | ICD-10-CM

## 2014-05-14 DIAGNOSIS — D509 Iron deficiency anemia, unspecified: Secondary | ICD-10-CM

## 2014-05-14 DIAGNOSIS — D696 Thrombocytopenia, unspecified: Secondary | ICD-10-CM

## 2014-05-14 MED ORDER — HYDROCODONE-ACETAMINOPHEN 5-325 MG PO TABS: 1.0000 | ORAL_TABLET | ORAL | Status: DC | PRN

## 2014-05-14 NOTE — Telephone Encounter (Signed)
ROUTING TO DR HARRISON 

## 2014-05-14 NOTE — Telephone Encounter (Signed)
Call received from patient, requests refill on pain medication Norco 5-325.  His last appointment was for total knee followup, 07/23/10 (surgeries were 2007,and 2010.  Please advise regarding refill and appointment.  [id number]

## 2014-05-14 NOTE — Telephone Encounter (Signed)
Refill his medicine

## 2014-05-14 NOTE — Progress Notes (Signed)
cc'ed to pcp °

## 2014-05-14 NOTE — Progress Notes (Signed)
Primary Care Physician: [name], MD  Primary Gastroenterologist:  Barney Drain, MD   Chief Complaint  Patient presents with  . Follow-up    HPI: [name] is a 63 y.o. male here for one-year followup. He has a history of IDA, fatty liver, alcohol abuse. Last seen in July 2014.  Weight down from 253 one year ago, 237 today. Walking more. Feels well. Denies any heartburn, dysphagia, vomiting, abdominal pain, constipation, diarrhea, melena, rectal bleeding. States Dr. Everette Rank thought he was due for colonoscopy. See past surgical history for details of prior procedures. Patient denies ASA/NSAIDs. Recent hemoglobin normal. Continues to have a slightly elevated AST. Patient states he drinks alcohol on weekends only. This blood test was obtained on a Friday morning. Continues to have mild thrombocytopenia. Last imaging of the liver was in January 2014 at time of contrasted CT. He had moderate steatosis but no splenomegaly noted.  Current Outpatient Prescriptions  Medication Sig Dispense Refill  . amLODipine (NORVASC) 10 MG tablet Take 10 mg by mouth daily.      . cyanocobalamin (,VITAMIN B-12,) 1000 MCG/ML injection Inject 1,000 mcg into the muscle every 30 (thirty) days.        . febuxostat (ULORIC) 40 MG tablet Take 40 mg by mouth daily.       Marland Kitchen levothyroxine (SYNTHROID, LEVOTHROID) 175 MCG tablet Take 175 mcg by mouth daily before breakfast.       . metoprolol succinate (TOPROL-XL) 25 MG 24 hr tablet Take 25 mg by mouth every morning.       Marland Kitchen omeprazole (PRILOSEC) 20 MG capsule Take 20 mg by mouth daily.        . tamsulosin (FLOMAX) 0.4 MG CAPS capsule Take 0.4 mg by mouth daily.       No current facility-administered medications for this visit.    Allergies as of 05/14/2014 - Review Complete 05/14/2014  Allergen Reaction Noted  . Aspirin  06/01/2007   Past Medical History  Diagnosis Date  . Diverticulosis 2008 LGIB  . Gout   . HTN (hypertension)   . History of  septic arthritis   . History of alcohol abuse   . Anemia FeDA: GASTRIC POLYPS, B12    TCS 2008 EGD 2009, 2008-HB 11.1 MCV 83.6 CR 1.22, 2009 FERRITIN 102-22  . Hepatomegaly 2o to FATTY LIVER DZ  . Chronic knee pain   . B12 deficiency    Past Surgical History  Procedure Laterality Date  . Upper gastrointestinal endoscopy  APR 2009    INFLAMED HYPERPLASTIC POLYPS, CHRONIC GASTRITIS  . Colonoscopy  2008 [facility] DJ    LGIB 2o to TICS, prep good  . Replacement total knee bilateral    . Hand surgery    . Cholecystectomy    . Esophagogastroduodenoscopy  12/08/2007    [id number] gastric polyps seen in the cardia and body of the stomach/Normal esophagus without evidence of Barrett's, mass, erosion/Normal duodenal bulb and second portion of the duodenum. Benign bx.    ROS:  General: Negative for anorexia, weight loss, fever, chills, fatigue, weakness. ENT: Negative for hoarseness, difficulty swallowing , nasal congestion. CV: Negative for chest pain, angina, palpitations, dyspnea on exertion, peripheral edema.  Respiratory: Negative for dyspnea at rest, dyspnea on exertion, cough, sputum, wheezing.  GI: See history of present illness. GU:  Negative for dysuria, hematuria, urinary incontinence, urinary frequency, nocturnal urination.  Endo: Negative for unusual weight change.    Physical Examination:   BP 134/81  Pulse 103  Temp(Src) 98.1 F (36.7 C) (Oral)  Ht 6\' 1"  (1.854 m)  Wt 237 lb 6.4 oz (107.684 kg)  BMI 31.33 kg/m2  General: Well-nourished, well-developed in no acute distress.  Eyes: No icterus. Mouth: Oropharyngeal mucosa moist and pink , no lesions erythema or exudate. Lungs: Clear to auscultation bilaterally.  Heart: Regular rate and rhythm, no murmurs rubs or gallops.  Abdomen: Bowel sounds are normal, nontender, nondistended, no hepatosplenomegaly or masses, no abdominal bruits or hernia , no rebound or guarding.  Sebaceous cyst noted left of umbilicus measuring size of  grape. Extremities: No lower extremity edema. No clubbing or deformities. Neuro: Alert and oriented x 4   Skin: Warm and dry, no jaundice.   Psych: Alert and cooperative, normal mood and affect.  Labs:  Labs from 04/13/2014 White blood cell count 5000, hemoglobin 14.5, MCV 92.1, platelets 135,000, sodium 138, Potassium 3.4, BUN 15, creatinine 1.19, total bilirubin 0.9, alkaline phosphatase 51, AST 48 high, ALT 24, albumin 3.9, vitamin B12 1140, TSH 5.532, PSA 2.33  Imaging Studies: No results found.

## 2014-05-14 NOTE — Assessment & Plan Note (Addendum)
Persistently abnormal AST without previous workup to my knowledge. Suspected to be due to alcohol related fatty liver disease in the past. Will obtain autoimmune serologies, hepatitis B and C. serologies, check immune status for hepatitis A and B. as a suspect he has an element of chronic liver disease given thrombocytopenia. Will discuss with Dr. Oneida Alar, consider ultrasound with elastography. Encouraged complete etoh cessation. OV in one year with Dr. Oneida Alar.

## 2014-05-14 NOTE — Patient Instructions (Signed)
1. Please have your lab work done. 2. Please collect stool specimen and return to our office.  3. Office visit in one year. 4. You need to try and cut out all of your alcohol use to protect your liver from further damage.

## 2014-05-14 NOTE — Assessment & Plan Note (Signed)
Continue current PPI regimen.

## 2014-05-14 NOTE — Assessment & Plan Note (Signed)
Hemoglobin normal. Check ifobt given patient's concern about needing another colonoscopy.

## 2014-05-15 ENCOUNTER — Ambulatory Visit (INDEPENDENT_AMBULATORY_CARE_PROVIDER_SITE_OTHER): Payer: Medicare Other

## 2014-05-15 DIAGNOSIS — D509 Iron deficiency anemia, unspecified: Secondary | ICD-10-CM

## 2014-05-15 LAB — IFOBT (OCCULT BLOOD): IFOBT: POSITIVE

## 2014-05-15 NOTE — Telephone Encounter (Signed)
Patient aware prescription available for pick up 

## 2014-05-15 NOTE — Progress Notes (Signed)
Pt returned IFOBT test and it was POSITIVE

## 2014-05-17 LAB — IGG, IGA, IGM
IGM, SERUM: 32 mg/dL — AB (ref 41–251)
IgA: 1590 mg/dL — ABNORMAL HIGH (ref 68–379)
IgG (Immunoglobin G), Serum: 1120 mg/dL (ref 650–1600)

## 2014-05-17 LAB — IRON AND TIBC
%SAT: 18 % — ABNORMAL LOW (ref 20–55)
Iron: 60 ug/dL (ref 42–165)
TIBC: 340 ug/dL (ref 215–435)
UIBC: 280 ug/dL (ref 125–400)

## 2014-05-17 LAB — FERRITIN: Ferritin: 42 ng/mL (ref 22–322)

## 2014-05-17 LAB — HEPATITIS C ANTIBODY: HCV Ab: NEGATIVE

## 2014-05-17 LAB — HEPATITIS B SURFACE ANTIGEN: HEP B S AG: NEGATIVE

## 2014-05-17 LAB — ANA: ANA: NEGATIVE

## 2014-05-17 LAB — HEPATITIS A ANTIBODY, TOTAL: Hep A Total Ab: NONREACTIVE

## 2014-05-17 LAB — HEPATITIS B SURFACE ANTIBODY,QUALITATIVE: Hep B S Ab: NEGATIVE

## 2014-05-23 NOTE — Progress Notes (Signed)
Quick Note:  Called and informed pt. Lab orders faxed to [facility]. ______

## 2014-05-23 NOTE — Progress Notes (Signed)
Quick Note:  No chronic HCV/HBV, hemochromatosis.   1. Not immune to hepatitis A or B. Would suggest vaccination given underlying liver disease. 2. IgA level up, possibly related to liver. Need a serum electrophoresis done. He will have to go back to lab. Will also do AMA, ASMA. See orders.  3. His ifobt is positive. To discuss all findings with Dr. Oneida Alar, once last labs are back and then we will come up with next step. ______

## 2014-05-23 NOTE — Addendum Note (Signed)
Addended by: Mahala Menghini on: 05/23/2014 02:40 PM   Modules accepted: Orders

## 2014-06-26 ENCOUNTER — Telehealth: Payer: Self-pay | Admitting: Orthopedic Surgery

## 2014-06-26 ENCOUNTER — Other Ambulatory Visit: Payer: Self-pay | Admitting: *Deleted

## 2014-06-26 DIAGNOSIS — G8929 Other chronic pain: Secondary | ICD-10-CM

## 2014-06-26 MED ORDER — HYDROCODONE-ACETAMINOPHEN 5-325 MG PO TABS: 1.0000 | ORAL_TABLET | ORAL | Status: DC | PRN

## 2014-06-26 NOTE — Telephone Encounter (Signed)
[name] is calling asking for a refill on his pain medication HYDROcodone-acetaminophen (NORCO) 5-325 MG per tablet please advise?

## 2014-06-26 NOTE — Telephone Encounter (Signed)
Prescription available, patient aware  

## 2014-07-03 ENCOUNTER — Telehealth: Payer: Self-pay | Admitting: Gastroenterology

## 2014-07-03 NOTE — Telephone Encounter (Signed)
Please remind patient to go back and have labs done, very important. See pending labs from 05/2014. Please add a CBC also given ifobt positive.  Please arrange for Hep A and B vaccines.

## 2014-07-04 ENCOUNTER — Other Ambulatory Visit: Payer: Self-pay

## 2014-07-04 DIAGNOSIS — R195 Other fecal abnormalities: Secondary | ICD-10-CM

## 2014-07-04 NOTE — Telephone Encounter (Signed)
Called and informed pt. Faxed CBC order to [facility]. Pt will go tomorrow for labs.  Order for [employer] ( Dx : chronic liver disease), mailed to pt and see is aware he might need to go to the health Dept to have these. ( He wants to discuss with PCP Dr. Everette Rank).

## 2014-07-06 LAB — CBC WITH DIFFERENTIAL/PLATELET
Basophils Absolute: 0 10*3/uL (ref 0.0–0.1)
Basophils Relative: 0 % (ref 0–1)
Eosinophils Absolute: 0.3 10*3/uL (ref 0.0–0.7)
Eosinophils Relative: 4 % (ref 0–5)
HCT: 42.6 % (ref 39.0–52.0)
HEMOGLOBIN: 14.4 g/dL (ref 13.0–17.0)
LYMPHS ABS: 3.7 10*3/uL (ref 0.7–4.0)
Lymphocytes Relative: 53 % — ABNORMAL HIGH (ref 12–46)
MCH: 30.4 pg (ref 26.0–34.0)
MCHC: 33.8 g/dL (ref 30.0–36.0)
MCV: 90.1 fL (ref 78.0–100.0)
MONO ABS: 0.6 10*3/uL (ref 0.1–1.0)
MONOS PCT: 8 % (ref 3–12)
NEUTROS ABS: 2.4 10*3/uL (ref 1.7–7.7)
NEUTROS PCT: 35 % — AB (ref 43–77)
Platelets: 151 10*3/uL (ref 150–400)
RBC: 4.73 MIL/uL (ref 4.22–5.81)
RDW: 14.9 % (ref 11.5–15.5)
WBC: 6.9 10*3/uL (ref 4.0–10.5)

## 2014-07-06 LAB — MITOCHONDRIAL/SMOOTH MUSCLE AB PNL
Mitochondrial M2 Ab, IgG: 0.32 (ref <0.91)
Smooth Muscle Ab: 19 U (ref <20)

## 2014-07-06 NOTE — Progress Notes (Signed)
Quick Note:  Looks like the labs did not do the orders that were put in in 05/2014. I need those ASAP. ______

## 2014-07-06 NOTE — Telephone Encounter (Signed)
Appears the labs only did the CBC. I need the labs from 05/2014 ASAP.

## 2014-07-06 NOTE — Telephone Encounter (Signed)
Tried to call pt to ask him to have labs done. NA- LM

## 2014-07-09 ENCOUNTER — Telehealth: Payer: Self-pay | Admitting: Gastroenterology

## 2014-07-09 LAB — PROTEIN ELECTROPHORESIS, SERUM, WITH REFLEX
ALBUMIN ELP: 47.7 % — AB (ref 55.8–66.1)
ALPHA-1-GLOBULIN: 3.9 % (ref 2.9–4.9)
Alpha-2-Globulin: 8.3 % (ref 7.1–11.8)
BETA 2: 22.2 % — AB (ref 3.2–6.5)
BETA GLOBULIN: 8.4 % — AB (ref 4.7–7.2)
Gamma Globulin: 9.5 % — ABNORMAL LOW (ref 11.1–18.8)
Total Protein, Serum Electrophoresis: 7.5 g/dL (ref 6.0–8.3)

## 2014-07-09 LAB — IGG, IGA, IGM
IGA: 1790 mg/dL — AB (ref 68–379)
IGG (IMMUNOGLOBIN G), SERUM: 1410 mg/dL (ref 650–1600)
IgM, Serum: 30 mg/dL — ABNORMAL LOW (ref 41–251)

## 2014-07-09 NOTE — Telephone Encounter (Signed)
Per Magda Paganini these labs have been done.

## 2014-07-09 NOTE — Telephone Encounter (Signed)
PATIENT NIECE CALLED STATING THAT THEY WERE TOLD HIS PRESCRIPTION FOR THE SHOT THAT WAS SENT TO [facility] REQUIRES 2 MORE SHOTS AFTER THE ONE HE JUST HAD  PLEASE ADVISE [phone]

## 2014-07-09 NOTE — Progress Notes (Signed)
Quick Note:  Per Magda Paganini these came in later. ______

## 2014-07-09 NOTE — Telephone Encounter (Signed)
Pt thought that we had to send another order for his next injection and I explained to him that one covered all 3. He said he was told to come back in 30 days and he will do so.

## 2014-07-10 ENCOUNTER — Telehealth: Payer: Self-pay | Admitting: Gastroenterology

## 2014-07-10 ENCOUNTER — Other Ambulatory Visit: Payer: Self-pay

## 2014-07-10 LAB — IFE INTERPRETATION

## 2014-07-10 NOTE — Telephone Encounter (Signed)
I spoke to pt. He can come about 2:00 pm this afternoon for me to triage for his procedures.

## 2014-07-10 NOTE — Progress Notes (Signed)
Quick Note:  LMOM for pt to call. ( I will have him bring his meds by to be triaged for these procedures. I have already put down for 08/14/2014 at 8:15 in OR with Dr. Oneida Alar and his Pre-op appt will be 08/09/2014 at 10:00 Am per Hoyle Sauer. ______

## 2014-07-10 NOTE — Progress Notes (Signed)
Quick Note:  His Hgb okay. Please schedule TCS +/- EGD in OR with [facility] (pain med, h/o etoh abuse).  Reason for procedure, Heme+ stool, ?screen for varices (chronic liver disease). ______

## 2014-07-10 NOTE — Telephone Encounter (Signed)
Patient was returning a call. DS not available. Please call him back at [phone]

## 2014-07-10 NOTE — Progress Notes (Signed)
Quick Note:  Suspect elevated AST related to etoh use. Other labs unremarkable with regards to liver work up. To discuss electrophoresis results with hematology.  ______

## 2014-07-11 NOTE — Telephone Encounter (Signed)
Can we please bring him in to update his H+P since we have not seen him in 2 months. I didn't realize it had been that long. OK to save a spot on the schedule and you can use urgent OV if needed.

## 2014-07-11 NOTE — Telephone Encounter (Signed)
Gastroenterology Pre-Procedure Review  Request Date: 07/10/2014 Requesting Physician:Leslie Bobby Rumpf, PA and Dr. Oneida Alar  PATIENT REVIEW QUESTIONS: The patient responded to the following health history questions as indicated:   Triaged per Neil Crouch, PA for TCS +/- EGD in OR   1. Diabetes Melitis: no 2. Joint replacements in the past 12 months: no 3. Major health problems in the past 3 months: no 4. Has an artificial valve or MVP: no 5. Has a defibrillator: no 6. Has been advised in past to take antibiotics in advance of a procedure like teeth cleaning: no    MEDICATIONS & ALLERGIES:    Patient reports the following regarding taking any blood thinners:   Plavix? no Aspirin? no Coumadin? no  Patient confirms/reports the following medications:  Current Outpatient Prescriptions  Medication Sig Dispense Refill  . amLODipine (NORVASC) 10 MG tablet Take 10 mg by mouth daily.    . cyanocobalamin (,VITAMIN B-12,) 1000 MCG/ML injection Inject 1,000 mcg into the muscle every 30 (thirty) days.      . febuxostat (ULORIC) 40 MG tablet Take 40 mg by mouth daily.     Marland Kitchen HYDROcodone-acetaminophen (NORCO) 5-325 MG per tablet Take 1 tablet by mouth every 4 (four) hours as needed. 180 tablet 0  . levothyroxine (SYNTHROID, LEVOTHROID) 175 MCG tablet Take 175 mcg by mouth daily before breakfast.     . metoprolol succinate (TOPROL-XL) 25 MG 24 hr tablet Take 25 mg by mouth every morning.     Marland Kitchen omeprazole (PRILOSEC) 20 MG capsule Take 20 mg by mouth daily.      . tamsulosin (FLOMAX) 0.4 MG CAPS capsule Take 0.4 mg by mouth daily.     No current facility-administered medications for this visit.    Patient confirms/reports the following allergies:  Allergies  Allergen Reactions  . Aspirin     REACTION: Diverticular Bleed    No orders of the defined types were placed in this encounter.    AUTHORIZATION INFORMATION Primary Insurance:   ID #:   Group #:  Pre-Cert / Auth required:  Pre-Cert / Auth  #:   Secondary Insurance:   ID #:   Group #:  Pre-Cert / Auth required:  Pre-Cert / Auth #:   SCHEDULE INFORMATION: Procedure has been scheduled as follows:  Date: 08/14/2014               Time:  8:15 AM Location: [facility] Short Stay   This Gastroenterology Pre-Precedure Review Form is being routed to the following provider(s): Barney Drain, MD

## 2014-07-11 NOTE — Telephone Encounter (Signed)
Pt is scheduled for an OV on 07/16/2014 at 3:00 PM with Laban Emperor, NP.

## 2014-07-16 ENCOUNTER — Ambulatory Visit: Payer: Medicare Other | Admitting: Gastroenterology

## 2014-07-17 ENCOUNTER — Other Ambulatory Visit: Payer: Self-pay

## 2014-07-17 ENCOUNTER — Ambulatory Visit (INDEPENDENT_AMBULATORY_CARE_PROVIDER_SITE_OTHER): Payer: Medicare Other | Admitting: Gastroenterology

## 2014-07-17 ENCOUNTER — Encounter: Payer: Self-pay | Admitting: Gastroenterology

## 2014-07-17 VITALS — BP 129/80 | HR 82 | Temp 96.5°F | Ht 78.0 in | Wt 239.0 lb

## 2014-07-17 DIAGNOSIS — R195 Other fecal abnormalities: Secondary | ICD-10-CM | POA: Insufficient documentation

## 2014-07-17 DIAGNOSIS — R945 Abnormal results of liver function studies: Secondary | ICD-10-CM

## 2014-07-17 DIAGNOSIS — R7989 Other specified abnormal findings of blood chemistry: Secondary | ICD-10-CM | POA: Insufficient documentation

## 2014-07-17 MED ORDER — PEG 3350-KCL-NA BICARB-NACL 420 G PO SOLR: 4000.0000 mL | Freq: Once | ORAL | Status: DC

## 2014-07-17 NOTE — Patient Instructions (Signed)
We have scheduled you for a colonoscopy and upper endoscopy with Dr. Oneida Alar in the near future.  I would also like you to have an ultrasound of your liver.

## 2014-07-17 NOTE — Progress Notes (Signed)
Ultrasound is scheduled at [facility] for 07/20/2014 @ 8:00 am. Pt is aware.

## 2014-07-20 ENCOUNTER — Ambulatory Visit (HOSPITAL_COMMUNITY)
Admission: RE | Admit: 2014-07-20 | Discharge: 2014-07-20 | Disposition: A | Discharge status: Home / Self Care | Payer: Medicare Other | Source: Ambulatory Visit | Attending: Gastroenterology | Admitting: Gastroenterology

## 2014-07-20 DIAGNOSIS — K760 Fatty (change of) liver, not elsewhere classified: Secondary | ICD-10-CM | POA: Diagnosis not present

## 2014-07-20 DIAGNOSIS — R945 Abnormal results of liver function studies: Secondary | ICD-10-CM

## 2014-07-20 DIAGNOSIS — F1010 Alcohol abuse, uncomplicated: Secondary | ICD-10-CM | POA: Insufficient documentation

## 2014-07-20 DIAGNOSIS — R7989 Other specified abnormal findings of blood chemistry: Secondary | ICD-10-CM | POA: Diagnosis present

## 2014-07-20 NOTE — Assessment & Plan Note (Signed)
Chronically elevated AST likely secondary to ETOH abuse. No recent imaging no file. Negative viral markers, autoimmune/PBC. Proceed with [place] with elastography. Possible EGD at time of colonoscopy if evidence of early cirrhosis. Risks and benefits discussed at time of appt.   Possible upper endoscopy in the near future with Dr. Oneida Alar. The risks, benefits, and alternatives have been discussed in detail with patient. They have stated understanding and desire to proceed.  Propofol due to polypharmacy

## 2014-07-20 NOTE — Assessment & Plan Note (Signed)
63 year old male with last colonoscopy in 2008, now with heme positive stool. No overt signs of GI bleeding. Likely benign anorectal source; however, due to elapsed time since last lower GI evaluation, will proceed with early interval colonoscopy.   Proceed with colonoscopy with Dr. Oneida Alar in the near future. The risks, benefits, and alternatives have been discussed in detail with the patient. They state understanding and desire to proceed.  PROPOFOL due to history of ETOH abuse

## 2014-07-20 NOTE — Progress Notes (Signed)
Referring Provider: [name], MD Primary Care Physician:  [name], MD  Primary GI: Dr. Oneida Alar   Chief Complaint  Patient presents with  . Office Visit    HPI:   [name] presents today with a history of IDA, fatty liver, ETOH abuse. Last seen Sept 2015. Last EGD in April 2009 with 5 gastric polyps, normal esophagus, normal D1 and D2. Colonoscopy in 2008 with diverticula. Referred by PCP for question of colonoscopy. Heme positive. Also has persistently elevated AST with normal viral markers, negative for hemochromatosis. No concern for autoimmune/PBC. IgA level significantly elevated at 1590, with electrophoresis results to be discussed with hematology. Likely AST elevated due to ETOH use. Presents today to discuss TCS+/-EGD.   Denies abdominal pain, rectal bleeding, constipation, diarrhea, changes in bowel habits. No overt GI bleeding. No GI complaints today.   Past Medical History  Diagnosis Date  . Diverticulosis 2008 LGIB  . Gout   . HTN (hypertension)   . History of septic arthritis   . History of alcohol abuse   . Anemia FeDA: GASTRIC POLYPS, B12    TCS 2008 EGD 2009, 2008-HB 11.1 MCV 83.6 CR 1.22, 2009 FERRITIN 102-22  . Hepatomegaly 2o to FATTY LIVER DZ  . Chronic knee pain   . B12 deficiency   . Hypothyroidism     Past Surgical History  Procedure Laterality Date  . Upper gastrointestinal endoscopy  APR 2009    INFLAMED HYPERPLASTIC POLYPS, CHRONIC GASTRITIS  . Colonoscopy  2008 [facility] DJ    LGIB 2o to TICS, prep good  . Replacement total knee bilateral    . Hand surgery    . Cholecystectomy    . Esophagogastroduodenoscopy  12/08/2007    [id number] gastric polyps seen in the cardia and body of the stomach/Normal esophagus without evidence of Barrett's, mass, erosion/Normal duodenal bulb and second portion of the duodenum. Benign bx.    Current Outpatient Prescriptions  Medication Sig Dispense Refill  . amLODipine (NORVASC) 10 MG tablet Take 10 mg  by mouth daily.    . cyanocobalamin (,VITAMIN B-12,) 1000 MCG/ML injection Inject 1,000 mcg into the muscle every 30 (thirty) days.      . febuxostat (ULORIC) 40 MG tablet Take 40 mg by mouth daily.     Marland Kitchen HYDROcodone-acetaminophen (NORCO) 5-325 MG per tablet Take 1 tablet by mouth every 4 (four) hours as needed. 180 tablet 0  . levothyroxine (SYNTHROID, LEVOTHROID) 175 MCG tablet Take 175 mcg by mouth daily before breakfast.     . metoprolol succinate (TOPROL-XL) 25 MG 24 hr tablet Take 25 mg by mouth every morning.     Marland Kitchen omeprazole (PRILOSEC) 20 MG capsule Take 20 mg by mouth daily.      . tamsulosin (FLOMAX) 0.4 MG CAPS capsule Take 0.4 mg by mouth daily.    . polyethylene glycol-electrolytes (TRILYTE) 420 G solution Take 4,000 mLs by mouth once. 4000 mL 0   No current facility-administered medications for this visit.    Allergies as of 07/17/2014 - Review Complete 07/17/2014  Allergen Reaction Noted  . Aspirin  06/01/2007    Family History  Problem Relation Age of Onset  . Colon polyps Neg Hx   . Colon cancer Neg Hx   . Pancreatic disease Neg Hx     History   Social History  . Marital Status: Single    Spouse Name: N/A    Number of Children: 0  . Years of Education: N/A   Occupational History  .  retired    Social History Main Topics  . Smoking status: Former Smoker -- 0 years  . Smokeless tobacco: None     Comment: Quit x 10 years/ never smoked on regular basis  . Alcohol Use: Yes     Comment: drinks on weekends, gin/vodka 1/5th. heavier in remote past.  . Drug Use: No  . Sexual Activity: None   Other Topics Concern  . None   Social History Narrative   HE DOES NOT HAVE ANY CHILDREN.    Review of Systems: As mentioned in HPI  Physical Exam: BP 129/80 mmHg  Pulse 82  Temp(Src) 96.5 F (35.8 C) (Oral)  Ht 6\' 6"  (1.981 m)  Wt 239 lb (108.41 kg)  BMI 27.62 kg/m2 General:   Alert and oriented. No distress noted. Pleasant and cooperative.  Head:   Normocephalic and atraumatic. Eyes:  Conjuctiva clear without scleral icterus. Mouth:  Oral mucosa pink and moist.  Heart:  S1, S2 present without murmurs, rubs, or gallops. Regular rate and rhythm. Abdomen:  +BS, soft, non-tender and non-distended. No rebound or guarding. Small superficial cyst-like structure to left of umbilicus.  Msk:  Symmetrical without gross deformities. Normal posture. Extremities:  Without edema. Neurologic:  Alert and  oriented x4;  grossly normal neurologically. Skin:  Intact without significant lesions or rashes. Psych:  Alert and cooperative. Normal mood and affect.

## 2014-07-23 NOTE — Progress Notes (Signed)
Quick Note:  Please let patient know that I discussed results with hematology and they are advising he see them for abnormal electrophoresis. Important that he be seen by hematology. Make referral. ______

## 2014-07-24 ENCOUNTER — Other Ambulatory Visit: Payer: Self-pay

## 2014-07-24 DIAGNOSIS — R7401 Elevation of levels of liver transaminase levels: Secondary | ICD-10-CM

## 2014-07-24 DIAGNOSIS — R740 Nonspecific elevation of levels of transaminase and lactic acid dehydrogenase [LDH]: Principal | ICD-10-CM

## 2014-07-24 NOTE — Progress Notes (Signed)
Quick Note:  Pt is aware. Ok to make the referral to hematology. ______

## 2014-07-25 NOTE — Progress Notes (Signed)
cc'ed to pcp °

## 2014-07-25 NOTE — Progress Notes (Signed)
Quick Note:  Elastography with Metavir score of F3/F4 indicating fibrosis/early cirrhosis likely. Proceed with EGD at time of colonoscopy as planned. ______

## 2014-07-30 NOTE — Progress Notes (Signed)
Quick Note:  Pt is aware. ______ 

## 2014-08-06 ENCOUNTER — Other Ambulatory Visit: Payer: Self-pay | Admitting: *Deleted

## 2014-08-06 ENCOUNTER — Telehealth: Payer: Self-pay | Admitting: Orthopedic Surgery

## 2014-08-06 DIAGNOSIS — G8929 Other chronic pain: Secondary | ICD-10-CM

## 2014-08-06 MED ORDER — HYDROCODONE-ACETAMINOPHEN 5-325 MG PO TABS: 1.0000 | ORAL_TABLET | ORAL | Status: DC | PRN

## 2014-08-06 NOTE — Telephone Encounter (Signed)
Prescription available, patient aware  

## 2014-08-06 NOTE — Telephone Encounter (Signed)
Patient is calling asking for a refill on pain medication HYDROcodone-acetaminophen (NORCO) 5-325 MG per tablet please advise?

## 2014-08-07 NOTE — Telephone Encounter (Signed)
Patient picked up Rx

## 2014-08-09 ENCOUNTER — Ambulatory Visit (HOSPITAL_COMMUNITY): Payer: Medicare Other

## 2014-08-09 ENCOUNTER — Encounter (HOSPITAL_COMMUNITY)
Admission: RE | Admit: 2014-08-09 | Discharge: 2014-08-09 | Disposition: A | Discharge status: Home / Self Care | Payer: Medicare Other | Source: Ambulatory Visit | Attending: Gastroenterology | Admitting: Gastroenterology

## 2014-08-09 ENCOUNTER — Other Ambulatory Visit: Payer: Self-pay

## 2014-08-09 ENCOUNTER — Encounter (HOSPITAL_COMMUNITY): Payer: Self-pay

## 2014-08-09 DIAGNOSIS — Z01818 Encounter for other preprocedural examination: Secondary | ICD-10-CM | POA: Insufficient documentation

## 2014-08-09 LAB — HEMOGLOBIN AND HEMATOCRIT, BLOOD
HCT: 40.6 % (ref 39.0–52.0)
HEMOGLOBIN: 13.4 g/dL (ref 13.0–17.0)

## 2014-08-09 LAB — BASIC METABOLIC PANEL
Anion gap: 13 (ref 5–15)
BUN: 17 mg/dL (ref 6–23)
CALCIUM: 9.1 mg/dL (ref 8.4–10.5)
CHLORIDE: 102 meq/L (ref 96–112)
CO2: 27 mEq/L (ref 19–32)
Creatinine, Ser: 1.32 mg/dL (ref 0.50–1.35)
GFR calc Af Amer: 64 mL/min — ABNORMAL LOW (ref >90)
GFR calc non Af Amer: 55 mL/min — ABNORMAL LOW (ref >90)
Glucose, Bld: 103 mg/dL — ABNORMAL HIGH (ref 70–99)
Potassium: 3.7 mEq/L (ref 3.7–5.3)
Sodium: 142 mEq/L (ref 137–147)

## 2014-08-09 LAB — SURGICAL PCR SCREEN
MRSA, PCR: NEGATIVE
Staphylococcus aureus: NEGATIVE

## 2014-08-09 NOTE — Patient Instructions (Signed)
Federick Dace  08/09/2014   Your procedure is scheduled on:  08/14/2014  Report to [facility] at  645  AM.  Call this number if you have problems the morning of surgery: [phone]   Remember:   Do not eat food or drink liquids after midnight.   Take these medicines the morning of surgery with A SIP OF WATER: hydrocodone, amlodipine, uloric, levothyroxine, metoprolol, prilosec, flomax   Do not wear jewelry, make-up or nail polish.  Do not wear lotions, powders, or perfumes.   Do not shave 48 hours prior to surgery. Men may shave face and neck.  Do not bring valuables to the hospital.  [facility] is not responsible for any belongings or valuables.               Contacts, dentures or bridgework may not be worn into surgery.  Leave suitcase in the car. After surgery it may be brought to your room.  For patients admitted to the hospital, discharge time is determined by your treatment team.               Patients discharged the day of surgery will not be allowed to drive home.  Name and phone number of your driver: family  Special Instructions: N/A   Please read over the following fact sheets that you were given: Pain Booklet, Coughing and Deep Breathing, Surgical Site Infection Prevention, Anesthesia Post-op Instructions and Care and Recovery After Surgery Colonoscopy A colonoscopy is an exam to look at the entire large intestine (colon). This exam can help find problems such as tumors, polyps, inflammation, and areas of bleeding. The exam takes about 1 hour.  LET [facility] CARE PROVIDER KNOW ABOUT:   Any allergies you have.  All medicines you are taking, including vitamins, herbs, eye drops, creams, and over-the-counter medicines.  Previous problems you or members of your family have had with the use of anesthetics.  Any blood disorders you have.  Previous surgeries you have had.  Medical conditions you have. RISKS AND COMPLICATIONS  Generally, this is a safe  procedure. However, as with any procedure, complications can occur. Possible complications include:  Bleeding.  Tearing or rupture of the colon wall.  Reaction to medicines given during the exam.  Infection (rare). BEFORE THE PROCEDURE   Ask your health care provider about changing or stopping your regular medicines.  You may be prescribed an oral bowel prep. This involves drinking a large amount of medicated liquid, starting the day before your procedure. The liquid will cause you to have multiple loose stools until your stool is almost clear or light green. This cleans out your colon in preparation for the procedure.  Do not eat or drink anything else once you have started the bowel prep, unless your health care provider tells you it is safe to do so.  Arrange for someone to drive you home after the procedure. PROCEDURE   You will be given medicine to help you relax (sedative).  You will lie on your side with your knees bent.  A long, flexible tube with a light and camera on the end (colonoscope) will be inserted through the rectum and into the colon. The camera sends video back to a computer screen as it moves through the colon. The colonoscope also releases carbon dioxide gas to inflate the colon. This helps your health care provider see the area better.  During the exam, your health care provider may take a  small tissue sample (biopsy) to be examined under a microscope if any abnormalities are found.  The exam is finished when the entire colon has been viewed. AFTER THE PROCEDURE   Do not drive for 24 hours after the exam.  You may have a small amount of blood in your stool.  You may pass moderate amounts of gas and have mild abdominal cramping or bloating. This is caused by the gas used to inflate your colon during the exam.  Ask when your test results will be ready and how you will get your results. Make sure you get your test results. Document Released: 08/14/2000  Document Revised: 06/07/2013 Document Reviewed: 04/24/2013 [facility] Patient Information 2015 [place], [place]. This information is not intended to replace advice given to you by your health care provider. Make sure you discuss any questions you have with your health care provider. Esophagogastroduodenoscopy Esophagogastroduodenoscopy (EGD) is a procedure to examine the lining of the esophagus, stomach, and first part of the small intestine (duodenum). A long, flexible, lighted tube with a camera attached (endoscope) is inserted down the throat to view these organs. This procedure is done to detect problems or abnormalities, such as inflammation, bleeding, ulcers, or growths, in order to treat them. The procedure lasts about 5-20 minutes. It is usually an outpatient procedure, but it may need to be performed in emergency cases in the hospital. LET YOUR CAREGIVER KNOW ABOUT:   Allergies to food or medicine.  All medicines you are taking, including vitamins, herbs, eyedrops, and over-the-counter medicines and creams.  Use of steroids (by mouth or creams).  Previous problems you or members of your family have had with the use of anesthetics.  Any blood disorders you have.  Previous surgeries you have had.  Other health problems you have.  Possibility of pregnancy, if this applies. RISKS AND COMPLICATIONS  Generally, EGD is a safe procedure. However, as with any procedure, complications can occur. Possible complications include:  Infection.  Bleeding.  Tearing (perforation) of the esophagus, stomach, or duodenum.  Difficulty breathing or not being able to breath.  Excessive sweating.  Spasms of the larynx.  Slowed heartbeat.  Low blood pressure. BEFORE THE PROCEDURE  Do not eat or drink anything for 6-8 hours before the procedure or as directed by your caregiver.  Ask your caregiver about changing or stopping your regular medicines.  If you wear dentures, be prepared to remove  them before the procedure.  Arrange for someone to drive you home after the procedure. PROCEDURE   A vein will be accessed to give medicines and fluids. A medicine to relax you (sedative) and a pain reliever will be given through that access into the vein.  A numbing medicine (local anesthetic) may be sprayed on your throat for comfort and to stop you from gagging or coughing.  A mouth guard may be placed in your mouth to protect your teeth and to keep you from biting on the endoscope.  You will be asked to lie on your left side.  The endoscope is inserted down your throat and into the esophagus, stomach, and duodenum.  Air is put through the endoscope to allow your caregiver to view the lining of your esophagus clearly.  The esophagus, stomach, and duodenum is then examined. During the exam, your caregiver may:  Remove tissue to be examined under a microscope (biopsy) for inflammation, infection, or other medical problems.  Remove growths.  Remove objects (foreign bodies) that are stuck.  Treat any bleeding with medicines  or other devices that stop tissues from bleeding (hot cautery, clipping devices).  Widen (dilate) or stretch narrowed areas of the esophagus and stomach.  The endoscope will then be withdrawn. AFTER THE PROCEDURE  You will be taken to a recovery area to be monitored. You will be able to go home once you are stable and alert.  Do not eat or drink anything until the local anesthetic and numbing medicines have worn off. You may choke.  It is normal to feel bloated, have pain with swallowing, or have a sore throat for a short time. This will wear off.  Your caregiver should be able to discuss his or her findings with you. It will take longer to discuss the test results if any biopsies were taken. Document Released: 12/18/2004 Document Revised: 01/01/2014 Document Reviewed: 07/20/2012 [facility] Patient Information 2015 [place], [place]. This information is not  intended to replace advice given to you by your health care provider. Make sure you discuss any questions you have with your health care provider. PATIENT INSTRUCTIONS POST-ANESTHESIA  IMMEDIATELY FOLLOWING SURGERY:  Do not drive or operate machinery for the first twenty four hours after surgery.  Do not make any important decisions for twenty four hours after surgery or while taking narcotic pain medications or sedatives.  If you develop intractable nausea and vomiting or a severe headache please notify your doctor immediately.  FOLLOW-UP:  Please make an appointment with your surgeon as instructed. You do not need to follow up with anesthesia unless specifically instructed to do so.  WOUND CARE INSTRUCTIONS (if applicable):  Keep a dry clean dressing on the anesthesia/puncture wound site if there is drainage.  Once the wound has quit draining you may leave it open to air.  Generally you should leave the bandage intact for twenty four hours unless there is drainage.  If the epidural site drains for more than 36-48 hours please call the anesthesia department.  QUESTIONS?:  Please feel free to call your physician or the hospital operator if you have any questions, and they will be happy to assist you.

## 2014-08-10 NOTE — Progress Notes (Signed)
This encounter was created in error - please disregard.

## 2014-08-13 ENCOUNTER — Encounter (HOSPITAL_COMMUNITY): Payer: Self-pay

## 2014-08-14 ENCOUNTER — Ambulatory Visit (HOSPITAL_COMMUNITY)
Admission: RE | Admit: 2014-08-14 | Discharge: 2014-08-14 | Disposition: A | Discharge status: Home / Self Care | Payer: Medicare Other | Source: Ambulatory Visit | Attending: Gastroenterology | Admitting: Gastroenterology

## 2014-08-14 ENCOUNTER — Encounter (HOSPITAL_COMMUNITY)
Admission: RE | Disposition: A | Discharge status: Home / Self Care | Payer: Self-pay | Source: Ambulatory Visit | Attending: Gastroenterology

## 2014-08-14 ENCOUNTER — Ambulatory Visit (HOSPITAL_COMMUNITY): Payer: Medicare Other | Admitting: Anesthesiology

## 2014-08-14 ENCOUNTER — Encounter (HOSPITAL_COMMUNITY): Payer: Self-pay

## 2014-08-14 DIAGNOSIS — K760 Fatty (change of) liver, not elsewhere classified: Secondary | ICD-10-CM | POA: Insufficient documentation

## 2014-08-14 DIAGNOSIS — M109 Gout, unspecified: Secondary | ICD-10-CM | POA: Diagnosis not present

## 2014-08-14 DIAGNOSIS — K317 Polyp of stomach and duodenum: Secondary | ICD-10-CM

## 2014-08-14 DIAGNOSIS — K2950 Unspecified chronic gastritis without bleeding: Secondary | ICD-10-CM | POA: Insufficient documentation

## 2014-08-14 DIAGNOSIS — R195 Other fecal abnormalities: Secondary | ICD-10-CM

## 2014-08-14 DIAGNOSIS — G8929 Other chronic pain: Secondary | ICD-10-CM | POA: Insufficient documentation

## 2014-08-14 DIAGNOSIS — M25569 Pain in unspecified knee: Secondary | ICD-10-CM | POA: Insufficient documentation

## 2014-08-14 DIAGNOSIS — I10 Essential (primary) hypertension: Secondary | ICD-10-CM | POA: Diagnosis not present

## 2014-08-14 DIAGNOSIS — D3A8 Other benign neuroendocrine tumors: Secondary | ICD-10-CM | POA: Insufficient documentation

## 2014-08-14 DIAGNOSIS — E039 Hypothyroidism, unspecified: Secondary | ICD-10-CM | POA: Diagnosis not present

## 2014-08-14 DIAGNOSIS — E538 Deficiency of other specified B group vitamins: Secondary | ICD-10-CM | POA: Diagnosis not present

## 2014-08-14 DIAGNOSIS — D120 Benign neoplasm of cecum: Secondary | ICD-10-CM | POA: Diagnosis not present

## 2014-08-14 DIAGNOSIS — Z87891 Personal history of nicotine dependence: Secondary | ICD-10-CM | POA: Diagnosis not present

## 2014-08-14 DIAGNOSIS — D124 Benign neoplasm of descending colon: Secondary | ICD-10-CM | POA: Diagnosis not present

## 2014-08-14 DIAGNOSIS — K5730 Diverticulosis of large intestine without perforation or abscess without bleeding: Secondary | ICD-10-CM | POA: Insufficient documentation

## 2014-08-14 DIAGNOSIS — K6389 Other specified diseases of intestine: Secondary | ICD-10-CM | POA: Diagnosis not present

## 2014-08-14 DIAGNOSIS — R160 Hepatomegaly, not elsewhere classified: Secondary | ICD-10-CM | POA: Insufficient documentation

## 2014-08-14 HISTORY — PX: BIOPSY: SHX5522

## 2014-08-14 HISTORY — PX: ESOPHAGOGASTRODUODENOSCOPY (EGD) WITH PROPOFOL: SHX5813

## 2014-08-14 HISTORY — PX: COLONOSCOPY WITH PROPOFOL: SHX5780

## 2014-08-14 HISTORY — PX: POLYPECTOMY: SHX5525

## 2014-08-14 SURGERY — COLONOSCOPY WITH PROPOFOL
Anesthesia: Monitor Anesthesia Care

## 2014-08-14 MED ORDER — FENTANYL CITRATE 0.05 MG/ML IJ SOLN
25.0000 ug | INTRAMUSCULAR | Status: AC
  Administered 2014-08-14 (×2): 25 ug via INTRAVENOUS

## 2014-08-14 MED ORDER — ONDANSETRON HCL 4 MG/2ML IJ SOLN
INTRAMUSCULAR | Status: AC
  Filled 2014-08-14: qty 2

## 2014-08-14 MED ORDER — PROPOFOL 10 MG/ML IV BOLUS
INTRAVENOUS | Status: AC
  Filled 2014-08-14: qty 20

## 2014-08-14 MED ORDER — ARTIFICIAL TEARS OP OINT
TOPICAL_OINTMENT | OPHTHALMIC | Status: AC
  Filled 2014-08-14: qty 3.5

## 2014-08-14 MED ORDER — PROPOFOL INFUSION 10 MG/ML OPTIME
INTRAVENOUS | Status: DC | PRN
  Administered 2014-08-14: 09:00:00 via INTRAVENOUS
  Administered 2014-08-14: 75 ug/kg/min via INTRAVENOUS
  Administered 2014-08-14: 09:00:00 via INTRAVENOUS

## 2014-08-14 MED ORDER — LACTATED RINGERS IV SOLN
INTRAVENOUS | Status: DC | PRN
  Administered 2014-08-14 (×2): via INTRAVENOUS

## 2014-08-14 MED ORDER — SODIUM CHLORIDE 0.9 % IJ SOLN
PREFILLED_SYRINGE | INTRAMUSCULAR | Status: DC | PRN
  Administered 2014-08-14: 3 mL

## 2014-08-14 MED ORDER — SPOT INK MARKER SYRINGE KIT
PACK | SUBMUCOSAL | Status: DC | PRN
  Administered 2014-08-14: 1 mL via SUBMUCOSAL

## 2014-08-14 MED ORDER — MIDAZOLAM HCL 2 MG/2ML IJ SOLN
INTRAMUSCULAR | Status: AC
  Filled 2014-08-14: qty 2

## 2014-08-14 MED ORDER — LACTATED RINGERS IV SOLN
INTRAVENOUS | Status: DC
  Administered 2014-08-14: 08:00:00 via INTRAVENOUS

## 2014-08-14 MED ORDER — FENTANYL CITRATE 0.05 MG/ML IJ SOLN: 25.0000 ug | INTRAMUSCULAR | Status: DC | PRN

## 2014-08-14 MED ORDER — MIDAZOLAM HCL 5 MG/5ML IJ SOLN
INTRAMUSCULAR | Status: DC | PRN
  Administered 2014-08-14: 1 mg via INTRAVENOUS
  Administered 2014-08-14: 0.5 mg via INTRAVENOUS

## 2014-08-14 MED ORDER — EPINEPHRINE HCL 0.1 MG/ML IJ SOSY
PREFILLED_SYRINGE | INTRAMUSCULAR | Status: AC
  Filled 2014-08-14: qty 10

## 2014-08-14 MED ORDER — STERILE WATER FOR IRRIGATION IR SOLN
Status: DC | PRN
  Administered 2014-08-14: 1000 mL

## 2014-08-14 MED ORDER — FENTANYL CITRATE 0.05 MG/ML IJ SOLN
INTRAMUSCULAR | Status: AC
  Filled 2014-08-14: qty 2

## 2014-08-14 MED ORDER — ONDANSETRON HCL 4 MG/2ML IJ SOLN
4.0000 mg | Freq: Once | INTRAMUSCULAR | Status: AC
  Administered 2014-08-14: 4 mg via INTRAVENOUS

## 2014-08-14 MED ORDER — FENTANYL CITRATE 0.05 MG/ML IJ SOLN
INTRAMUSCULAR | Status: DC | PRN
  Administered 2014-08-14 (×2): 25 ug via INTRAVENOUS

## 2014-08-14 MED ORDER — PHENYLEPHRINE HCL 10 MG/ML IJ SOLN
INTRAMUSCULAR | Status: DC | PRN
  Administered 2014-08-14 (×2): 50 ug via INTRAVENOUS

## 2014-08-14 MED ORDER — LIDOCAINE VISCOUS 2 % MT SOLN
15.0000 mL | Freq: Once | OROMUCOSAL | Status: AC
  Administered 2014-08-14: 15 mL via OROMUCOSAL
  Filled 2014-08-14: qty 15

## 2014-08-14 MED ORDER — ONDANSETRON HCL 4 MG/2ML IJ SOLN: 4.0000 mg | Freq: Once | INTRAMUSCULAR | Status: DC | PRN

## 2014-08-14 MED ORDER — LIDOCAINE HCL (PF) 1 % IJ SOLN
INTRAMUSCULAR | Status: AC
  Filled 2014-08-14: qty 5

## 2014-08-14 MED ORDER — MIDAZOLAM HCL 2 MG/2ML IJ SOLN
1.0000 mg | INTRAMUSCULAR | Status: DC | PRN
Start: 2014-08-14 — End: 2014-08-14
  Administered 2014-08-14: 2 mg via INTRAVENOUS

## 2014-08-14 MED ORDER — LIDOCAINE VISCOUS 2 % MT SOLN
OROMUCOSAL | Status: AC
  Filled 2014-08-14: qty 15

## 2014-08-14 SURGICAL SUPPLY — 21 items
BLOCK BITE 60FR ADLT L/F BLUE (MISCELLANEOUS) ×2 IMPLANT
DEVICE CLIP HEMOSTAT 235CM (CLIP) ×4 IMPLANT
ELECT REM PT RETURN 9FT ADLT (ELECTROSURGICAL) ×4
ELECTRODE REM PT RTRN 9FT ADLT (ELECTROSURGICAL) IMPLANT
FLOOR PAD 36X40 (MISCELLANEOUS) ×4
FORCEPS BIOP RAD 4 LRG CAP 4 (CUTTING FORCEPS) ×2 IMPLANT
FORMALIN 10 PREFIL 20ML (MISCELLANEOUS) ×8 IMPLANT
KIT CLEAN ENDO COMPLIANCE (KITS) ×4 IMPLANT
LUBRICANT JELLY 4.5OZ STERILE (MISCELLANEOUS) ×3 IMPLANT
MANIFOLD NEPTUNE II (INSTRUMENTS) ×4 IMPLANT
NDL SCLEROTHERAPY 25GX240 (NEEDLE) IMPLANT
NEEDLE SCLEROTHERAPY 25GX240 (NEEDLE) ×4 IMPLANT
OVERTUBE ENDOCUFF GREEN (MISCELLANEOUS) ×2 IMPLANT
PAD FLOOR 36X40 (MISCELLANEOUS) ×2 IMPLANT
ROTH PLATINUM NET UNIVERSAL (MISCELLANEOUS) ×2 IMPLANT
SNARE ROTATE MED OVAL 20MM (MISCELLANEOUS) ×2 IMPLANT
SNARE SHORT THROW 13M SML OVAL (MISCELLANEOUS) ×2 IMPLANT
SYR 50ML LL SCALE MARK (SYRINGE) ×2 IMPLANT
TRAP SPECIMEN MUCOUS 40CC (MISCELLANEOUS) ×2 IMPLANT
TUBING IRRIGATION ENDOGATOR (MISCELLANEOUS) ×2 IMPLANT
WATER STERILE IRR 1000ML POUR (IV SOLUTION) ×3 IMPLANT

## 2014-08-14 NOTE — H&P (Signed)
Primary Care Physician:  [name], MD Primary Gastroenterologist:  Dr. Oneida Alar  Pre-Procedure History & Physical: HPI:  [name] is a 63 y.o. male here for HEME POS STOOLS/screen for varices.  Past Medical History  Diagnosis Date  . Diverticulosis 2008 LGIB  . Gout   . HTN (hypertension)   . History of septic arthritis   . History of alcohol abuse   . Anemia FeDA: GASTRIC POLYPS, B12    TCS 2008 EGD 2009, 2008-HB 11.1 MCV 83.6 CR 1.22, 2009 FERRITIN 102-22  . Hepatomegaly 2o to FATTY LIVER DZ  . Chronic knee pain   . B12 deficiency   . Hypothyroidism     Past Surgical History  Procedure Laterality Date  . Upper gastrointestinal endoscopy  APR 2009    INFLAMED HYPERPLASTIC POLYPS, CHRONIC GASTRITIS  . Colonoscopy  2008 [facility] DJ    LGIB 2o to TICS, prep good  . Replacement total knee bilateral    . Hand surgery    . Cholecystectomy    . Esophagogastroduodenoscopy  12/08/2007    [id number] gastric polyps seen in the cardia and body of the stomach/Normal esophagus without evidence of Barrett's, mass, erosion/Normal duodenal bulb and second portion of the duodenum. Benign bx.    Prior to Admission medications   Medication Sig Start Date End Date Taking? Authorizing Provider  amLODipine (NORVASC) 10 MG tablet Take 10 mg by mouth daily.   Yes Historical Provider, MD  cyanocobalamin (,VITAMIN B-12,) 1000 MCG/ML injection Inject 1,000 mcg into the muscle every 30 (thirty) days.     Yes Historical Provider, MD  febuxostat (ULORIC) 40 MG tablet Take 40 mg by mouth daily.    Yes Historical Provider, MD  HYDROcodone-acetaminophen (NORCO) 5-325 MG per tablet Take 1 tablet by mouth every 4 (four) hours as needed. 08/06/14  Yes Carole Civil, MD  levothyroxine (SYNTHROID, LEVOTHROID) 175 MCG tablet Take 175 mcg by mouth daily before breakfast.  05/02/14  Yes Historical Provider, MD  metoprolol succinate (TOPROL-XL) 25 MG 24 hr tablet Take 25 mg by mouth every morning.    Yes  Historical Provider, MD  omeprazole (PRILOSEC) 20 MG capsule Take 20 mg by mouth daily.     Yes Historical Provider, MD  polyethylene glycol-electrolytes (TRILYTE) 420 G solution Take 4,000 mLs by mouth once. 07/17/14  Yes Danie Binder, MD  tamsulosin (FLOMAX) 0.4 MG CAPS capsule Take 0.4 mg by mouth daily.   Yes Historical Provider, MD    Allergies as of 07/10/2014 - Review Complete 07/10/2014  Allergen Reaction Noted  . Aspirin  06/01/2007    Family History  Problem Relation Age of Onset  . Colon polyps Neg Hx   . Colon cancer Neg Hx   . Pancreatic disease Neg Hx     History   Social History  . Marital Status: Single    Spouse Name: N/A    Number of Children: 0  . Years of Education: N/A   Occupational History  . retired    Social History Main Topics  . Smoking status: Former Smoker -- 0 years  . Smokeless tobacco: Not on file     Comment: Quit x 10 years/ never smoked on regular basis  . Alcohol Use: Yes     Comment: drinks on weekends, gin/vodka 1/5th. heavier in remote past.  . Drug Use: No  . Sexual Activity: Not on file   Other Topics Concern  . Not on file   Social History Narrative   HE DOES  NOT HAVE ANY CHILDREN.    Review of Systems: See HPI, otherwise negative ROS   Physical Exam: BP 140/93 mmHg  Pulse 83  Temp(Src) 98.4 F (36.9 C) (Oral)  Resp 18  Ht 6' 0.5" (1.842 m)  Wt 240 lb (108.863 kg)  BMI 32.08 kg/m2  SpO2 97% General:   Alert,  pleasant and cooperative in NAD Head:  Normocephalic and atraumatic. Neck:  Supple; Lungs:  Clear throughout to auscultation.    Heart:  Regular rate and rhythm. Abdomen:  Soft, nontender and nondistended. Normal bowel sounds, without guarding, and without rebound.   Neurologic:  Alert and  oriented x4;  grossly normal neurologically.  Impression/Plan:   HEME POS STOOLS/screen for varices  PLAN:  1.TCS/EGD

## 2014-08-14 NOTE — Anesthesia Postprocedure Evaluation (Signed)
  Anesthesia Post-op Note  Patient: [name]  Procedure(s) Performed: Procedure(s): COLONOSCOPY WITH PROPOFOL at cecum at [phone]; withdrawal time=36 minutes (N/A) ESOPHAGOGASTRODUODENOSCOPY (EGD) WITH PROPOFOL (N/A) POLYPECTOMY BIOPSY  Patient Location: PACU  Anesthesia Type:MAC  Level of Consciousness: awake, alert  and oriented  Airway and Oxygen Therapy: Patient Spontanous Breathing and Patient connected to face mask oxygen  Post-op Pain: none  Post-op Assessment: Post-op Vital signs reviewed, Patient's Cardiovascular Status Stable, Respiratory Function Stable, Patent Airway and No signs of Nausea or vomiting  Post-op Vital Signs: Reviewed and stable  Last Vitals:  Filed Vitals:   08/14/14 0943  BP: 121/67  Pulse: 92  Temp: 36.5 C  Resp: 16    Complications: No apparent anesthesia complications

## 2014-08-14 NOTE — Discharge Instructions (Signed)
THE BLOOD DETECTED IN YOUR STOOL IS DUE TO GASTRITIS, GASTRIC POLYPS, AND POLYPS. You had 4 large polyps removed FROM YOUR COLON. YOUR PREP WAS NOT GOOD IN THE RIGHT COLON. You have internal hemorrhoids & DIVERTICULOSIS IN YOUR RIGHT AND LEFT COLON. I biopsied your stomach.    HOLD ASPIRIN. RESTART ON DEC 23.  NO MRI FOR 30 DAYS.  CONTINUE YOUR WEIGHT LOSS EFFORTS. LOSE 10 MORE LBS.  DRINK WATER TO KEEP YOUR URINE LIGHT YELLOW.  AVOID ITEMS THAT TRIGGER GASTRITIS. SEE INFO BELOW.  FOLLOW A HIGH FIBER/LOW FAT DIET. AVOID ITEMS THAT CAUSE BLOATING. SEE INFO BELOW.  YOUR BIOPSY WILL BE BACK IN 14 DAYS OR YOU CAN LOOK THEM UP ON MY CHART AFTER 3 DAYS.  Next colonoscopy in 1-2 MOS WITH PROPOFOL.   FOLLOW UP IN 6 MOS.   ENDOSCOPY Care After Read the instructions outlined below and refer to this sheet in the next week. These discharge instructions provide you with general information on caring for yourself after you leave the hospital. While your treatment has been planned according to the most current medical practices available, unavoidable complications occasionally occur. If you have any problems or questions after discharge, call DR. Elener Custodio, [phone].  ACTIVITY  You may resume your regular activity, but move at a slower pace for the next 24 hours.   Take frequent rest periods for the next 24 hours.   Walking will help get rid of the air and reduce the bloated feeling in your belly (abdomen).   No driving for 24 hours (because of the medicine (anesthesia) used during the test).   You may shower.   Do not sign any important legal documents or operate any machinery for 24 hours (because of the anesthesia used during the test).    NUTRITION  Drink plenty of fluids.   You may resume your normal diet as instructed by your doctor.   Begin with a light meal and progress to your normal diet. Heavy or fried foods are harder to digest and may make you feel sick to your stomach  (nauseated).   Avoid alcoholic beverages for 24 hours or as instructed.    MEDICATIONS  You may resume your normal medications.   WHAT YOU CAN EXPECT TODAY  Some feelings of bloating in the abdomen.   Passage of more gas than usual.   Spotting of blood in your stool or on the toilet paper  .  IF YOU HAD POLYPS REMOVED DURING THE ENDOSCOPY:  Eat a soft diet IF YOU HAVE NAUSEA, BLOATING, ABDOMINAL PAIN, OR VOMITING.    FINDING OUT THE RESULTS OF YOUR TEST Not all test results are available during your visit. DR. Oneida Alar WILL CALL YOU WITHIN 14 DAYS OF YOUR PROCEDUE WITH YOUR RESULTS. Do not assume everything is normal if you have not heard from DR. Jayleen Afonso, CALL HER OFFICE AT [phone].  SEEK IMMEDIATE MEDICAL ATTENTION AND CALL THE OFFICE: [phone] IF:  You have more than a spotting of blood in your stool.   Your belly is swollen (abdominal distention).   You are nauseated or vomiting.   You have a temperature over 101F.   You have abdominal pain or discomfort that is severe or gets worse throughout the day.   Gastritis  Gastritis is inflammation (the body's way of reacting to injury and/or infection) of the stomach. It is often caused by viral or bacterial (germ) infections. It can also be caused BY ASPIRIN, BC/GOODY POWDER'S, (IBUPROFEN) MOTRIN, OR ALEVE (NAPROXEN), chemicals (  including alcohol), SPICY FOODS, and medications. This illness may be associated with generalized malaise (feeling tired, not well), UPPER ABDOMINAL STOMACH cramps, and fever. One common bacterial cause of gastritis is an organism known as H. Pylori. This can be treated with antibiotics.    High-Fiber Diet A high-fiber diet changes your normal diet to include more whole grains, legumes, fruits, and vegetables. Changes in the diet involve replacing refined carbohydrates with unrefined foods. The calorie level of the diet is essentially unchanged. The Dietary Reference Intake (recommended  amount) for adult males is 38 grams per day. For adult females, it is 25 grams per day. Pregnant and lactating women should consume 28 grams of fiber per day. Fiber is the intact part of a plant that is not broken down during digestion. Functional fiber is fiber that has been isolated from the plant to provide a beneficial effect in the body. PURPOSE  Increase stool bulk.   Ease and regulate bowel movements.   Lower cholesterol.  INDICATIONS THAT YOU NEED MORE FIBER  Constipation and hemorrhoids.   Uncomplicated diverticulosis (intestine condition) and irritable bowel syndrome.   Weight management.   As a protective measure against hardening of the arteries (atherosclerosis), diabetes, and cancer.   GUIDELINES FOR INCREASING FIBER IN THE DIET  Start adding fiber to the diet slowly. A gradual increase of about 5 more grams (2 slices of whole-wheat bread, 2 servings of most fruits or vegetables, or 1 bowl of high-fiber cereal) per day is best. Too rapid an increase in fiber may result in constipation, flatulence, and bloating.   Drink enough water and fluids to keep your urine clear or pale yellow. Water, juice, or caffeine-free drinks are recommended. Not drinking enough fluid may cause constipation.   Eat a variety of high-fiber foods rather than one type of fiber.   Try to increase your intake of fiber through using high-fiber foods rather than fiber pills or supplements that contain small amounts of fiber.   The goal is to change the types of food eaten. Do not supplement your present diet with high-fiber foods, but replace foods in your present diet.  INCLUDE A VARIETY OF FIBER SOURCES  Replace refined and processed grains with whole grains, canned fruits with fresh fruits, and incorporate other fiber sources. White rice, white breads, and most bakery goods contain little or no fiber.   Brown whole-grain rice, buckwheat oats, and many fruits and vegetables are all good sources of  fiber. These include: broccoli, Brussels sprouts, cabbage, cauliflower, beets, sweet potatoes, white potatoes (skin on), carrots, tomatoes, eggplant, squash, berries, fresh fruits, and dried fruits.   Cereals appear to be the richest source of fiber. Cereal fiber is found in whole grains and bran. Bran is the fiber-rich outer coat of cereal grain, which is largely removed in refining. In whole-grain cereals, the bran remains. In breakfast cereals, the largest amount of fiber is found in those with "bran" in their names. The fiber content is sometimes indicated on the label.   You may need to include additional fruits and vegetables each day.   In baking, for 1 cup white flour, you may use the following substitutions:   1 cup whole-wheat flour minus 2 tablespoons.   1/2 cup white flour plus 1/2 cup whole-wheat flour.   Low-Fat Diet BREADS, CEREALS, PASTA, RICE, DRIED PEAS, AND BEANS These products are high in carbohydrates and most are low in fat. Therefore, they can be increased in the diet as substitutes for fatty foods.  They too, however, contain calories and should not be eaten in excess. Cereals can be eaten for snacks as well as for breakfast.  Include foods that contain fiber (fruits, vegetables, whole grains, and legumes). Research shows that fiber may lower blood cholesterol levels, especially the water-soluble fiber found in fruits, vegetables, oat products, and legumes. FRUITS AND VEGETABLES It is good to eat fruits and vegetables. Besides being sources of fiber, both are rich in vitamins and some minerals. They help you get the daily allowances of these nutrients. Fruits and vegetables can be used for snacks and desserts. MEATS Limit lean meat, chicken, [place], and fish to no more than 6 ounces per day. Beef, Pork, and Lamb Use lean cuts of beef, pork, and lamb. Lean cuts include:  Extra-lean ground beef.  Arm roast.  Sirloin tip.  Center-cut ham.  Round steak.  Loin chops.    Rump roast.  Tenderloin.  Trim all fat off the outside of meats before cooking. It is not necessary to severely decrease the intake of red meat, but lean choices should be made. Lean meat is rich in protein and contains a highly absorbable form of iron. Premenopausal women, in particular, should avoid reducing lean red meat because this could increase the risk for low red blood cells (iron-deficiency anemia). The organ meats, such as liver, sweetbreads, kidneys, and brain are very rich in cholesterol. They should be limited. Chicken and [place] These are good sources of protein. The fat of poultry can be reduced by removing the skin and underlying fat layers before cooking. Chicken and [place] can be substituted for lean red meat in the diet. Poultry should not be fried or covered with high-fat sauces. Fish and Shellfish Fish is a good source of protein. Shellfish contain cholesterol, but they usually are low in saturated fatty acids. The preparation of fish is important. Like chicken and [place], they should not be fried or covered with high-fat sauces. EGGS Egg whites contain no fat or cholesterol. They can be eaten often. Try 1 to 2 egg whites instead of whole eggs in recipes or use egg substitutes that do not contain yolk. MILK AND DAIRY PRODUCTS Use skim or 1% milk instead of 2% or whole milk. Decrease whole milk, natural, and processed cheeses. Use nonfat or low-fat (2%) cottage cheese or low-fat cheeses made from vegetable oils. Choose nonfat or low-fat (1 to 2%) yogurt. Experiment with evaporated skim milk in recipes that call for heavy cream. Substitute low-fat yogurt or low-fat cottage cheese for sour cream in dips and salad dressings. Have at least 2 servings of low-fat dairy products, such as 2 glasses of skim (or 1%) milk each day to help get your daily calcium intake.  FATS AND OILS Reduce the total intake of fats, especially saturated fat. Butterfat, lard, and beef fats are high in  saturated fat and cholesterol. These should be avoided as much as possible. Vegetable fats do not contain cholesterol, but certain vegetable fats, such as coconut oil, palm oil, and palm kernel oil are very high in saturated fats. These should be limited. These fats are often used in bakery goods, processed foods, popcorn, oils, and nondairy creamers. Vegetable shortenings and some peanut butters contain hydrogenated oils, which are also saturated fats. Read the labels on these foods and check for saturated vegetable oils. Unsaturated vegetable oils and fats do not raise blood cholesterol. However, they should be limited because they are fats and are high in calories. Total fat should still be limited  to 30% of your daily caloric intake. Desirable liquid vegetable oils are corn oil, cottonseed oil, olive oil, canola oil, safflower oil, soybean oil, and sunflower oil. Peanut oil is not as good, but small amounts are acceptable. Buy a heart-healthy tub margarine that has no partially hydrogenated oils in the ingredients. Mayonnaise and salad dressings often are made from unsaturated fats, but they should also be limited because of their high calorie and fat content. Seeds, nuts, peanut butter, olives, and avocados are high in fat, but the fat is mainly the unsaturated type. These foods should be limited mainly to avoid excess calories and fat. OTHER EATING TIPS Snacks  Most sweets should be limited as snacks. They tend to be rich in calories and fats, and their caloric content outweighs their nutritional value. Some good choices in snacks are graham crackers, melba toast, soda crackers, bagels (no egg), English muffins, fruits, and vegetables. These snacks are preferable to snack crackers, [place] fries, and chips. Popcorn should be air-popped or cooked in small amounts of liquid vegetable oil. Desserts Eat fruit, low-fat yogurt, and fruit ices. AVOID pastries, cake, and cookies. Sherbet, angel food cake,  gelatin dessert, frozen low-fat yogurt, or other frozen products that do not contain saturated fat (pure fruit juice bars, frozen ice pops) are also acceptable.  COOKING METHODS Choose those methods that use little or no fat. They include: Poaching.  Braising.  Steaming.  Grilling.  Baking.  Stir-frying.  Broiling.  Microwaving.  Foods can be cooked in a nonstick pan without added fat, or use a nonfat cooking spray in regular cookware. Limit fried foods and avoid frying in saturated fat. Add moisture to lean meats by using water, broth, cooking wines, and other nonfat or low-fat sauces along with the cooking methods mentioned above. Soups and stews should be chilled after cooking. The fat that forms on top after a few hours in the refrigerator should be skimmed off. When preparing meals, avoid using excess salt. Salt can contribute to raising blood pressure in some people. EATING AWAY FROM HOME Order entres, potatoes, and vegetables without sauces or butter. When meat exceeds the size of a deck of cards (3 to 4 ounces), the rest can be taken home for another meal. Choose vegetable or fruit salads and ask for low-calorie salad dressings to be served on the side. Use dressings sparingly. Limit high-fat toppings, such as bacon, crumbled eggs, cheese, sunflower seeds, and olives. Ask for heart-healthy tub margarine instead of butter.

## 2014-08-14 NOTE — Progress Notes (Signed)
REVIEWED.  

## 2014-08-14 NOTE — Transfer of Care (Signed)
Immediate Anesthesia Transfer of Care Note  Patient: [name]  Procedure(s) Performed: Procedure(s): COLONOSCOPY WITH PROPOFOL at cecum at [phone]; withdrawal time=36 minutes (N/A) ESOPHAGOGASTRODUODENOSCOPY (EGD) WITH PROPOFOL (N/A) POLYPECTOMY BIOPSY  Patient Location: PACU  Anesthesia Type:MAC  Level of Consciousness: awake  Airway & Oxygen Therapy: Patient Spontanous Breathing  Post-op Assessment: Report given to PACU RN  Post vital signs: Reviewed and stable  Complications: No apparent anesthesia complications

## 2014-08-14 NOTE — Anesthesia Procedure Notes (Signed)
Procedure Name: MAC Date/Time: 08/14/2014 8:16 AM Performed by: Andree Elk, Sigurd Pugh A Pre-anesthesia Checklist: Patient identified, Timeout performed, Emergency Drugs available, Suction available and Patient being monitored Oxygen Delivery Method: Simple face mask

## 2014-08-14 NOTE — Anesthesia Preprocedure Evaluation (Signed)
Anesthesia Evaluation  Patient identified by MRN, date of birth, ID band Patient awake    Reviewed: Allergy & Precautions, H&P , NPO status , Patient's Chart, lab work & pertinent test results  Airway Mallampati: II  TM Distance: >3 FB     Dental  (+) Teeth Intact, Missing   Pulmonary former smoker,  breath sounds clear to auscultation        Cardiovascular hypertension, Pt. on medications Rhythm:Regular Rate:Normal     Neuro/Psych    GI/Hepatic PUD, GERD-  ,(+)     substance abuse  alcohol use,   Endo/Other  Hypothyroidism Hx pancreatitis  Renal/GU      Musculoskeletal  (+) Arthritis -,   Abdominal   Peds  Hematology  (+) anemia ,   Anesthesia Other Findings   Reproductive/Obstetrics                             Anesthesia Physical Anesthesia Plan  ASA: III  Anesthesia Plan: MAC   Post-op Pain Management:    Induction: Intravenous  Airway Management Planned: Simple Face Mask  Additional Equipment:   Intra-op Plan:   Post-operative Plan:   Informed Consent: I have reviewed the patients History and Physical, chart, labs and discussed the procedure including the risks, benefits and alternatives for the proposed anesthesia with the patient or authorized representative who has indicated his/her understanding and acceptance.     Plan Discussed with:   Anesthesia Plan Comments:         Anesthesia Quick Evaluation

## 2014-08-15 ENCOUNTER — Encounter (HOSPITAL_COMMUNITY): Payer: Self-pay | Admitting: Gastroenterology

## 2014-08-15 NOTE — Op Note (Signed)
[facility] [address] [place], [address]   ENDOSCOPY PROCEDURE REPORT  PATIENT: Philip, King  MR#: [id number] BIRTHDATE: 09-06-1950 , 63  yrs. old GENDER: male  ENDOSCOPIST: Barney Drain, MD REFERRED [id number] Everette Rank, M.D.  PROCEDURE DATE: 08/14/2014 PROCEDURE:   EGD w/ biopsy  INDICATIONS:heme positive stool. MEDICATIONS: Monitored anesthesia care TOPICAL ANESTHETIC:   Viscous Xylocaine ASA CLASS:  DESCRIPTION OF PROCEDURE:     Physical exam was performed.  Informed consent was obtained from the patient after explaining the benefits, risks, and alternatives to the procedure.  The patient was connected to the monitor and placed in the left lateral position.  Continuous oxygen was provided by nasal cannula and IV medicine administered through an indwelling cannula.  After administration of sedation, the patients esophagus was intubated and the     endoscope was advanced under direct visualization to the second portion of the duodenum.  The scope was removed slowly by carefully examining the color, texture, anatomy, and integrity of the mucosa on the way out.  The patient was recovered in endoscopy and discharged home in satisfactory condition.   ESOPHAGUS: The mucosa of the esophagus appeared normal.   STOMACH: Multiple small sessile polyps with friable surfaces were found in the gastric body and gastric antrum.  Multiple biopsies was performed using cold forceps.   NORMAL DUODENUM. COMPLICATIONS: There were no immediate complications.  ENDOSCOPIC IMPRESSION: 1.   HEME POSITIVE STOOLS DUE TO GASTRIC AND COLON POLYPS 2.  Multiple GASTRIC  RECOMMENDATIONS: HOLD ASPIRIN.  RESTART ON DEC 23. NO MRI FOR 30 DAYS. CONTINUE YOUR WEIGHT LOSS EFFORTS.  LOSE 10 MORE LBS. DRINK WATER TO KEEP YOUR URINE LIGHT YELLOW. AVOID ITEMS THAT TRIGGER GASTRITIS.  SEE INFO BELOW. FOLLOW A HIGH FIBER/LOW FAT DIET.  AVOID ITEMS THAT CAUSE BLOATING.  AWAIT BIOPSY. Next  colonoscopy in 1-2 MOS WITH PROPOFOL. FOLLOW UP IN 6 MOS.  REPEAT EXAM: _____________eSigned:  Barney Drain, MD 2014/08/18 3:35 PM   CPT CODES: ICD CODES:  The ICD and CPT codes recommended by this software are interpretations from the data that the clinical staff has captured with the software.  The verification of the translation of this report to the ICD and CPT codes and modifiers is the sole responsibility of the health care institution and practicing physician where this report was generated.  [employer]. will not be held responsible for the validity of the ICD and CPT codes included on this report.  AMA assumes no liability for data contained or not contained herein. CPT is a Designer, television/film set of the [employer].

## 2014-08-15 NOTE — Op Note (Addendum)
[facility] [address] [place], [address]   COLONOSCOPY PROCEDURE REPORT  PATIENT: [name], [name]  MR#: [id number] BIRTHDATE: 08-Oct-1950 , 63  yrs. old GENDER: male ENDOSCOPIST: Barney Drain, MD REFERRED [id number] Everette Rank, M.D. PROCEDURE DATE:  08/14/2014 PROCEDURE:   Colonoscopy with snare polypectomy and Submucosal injection, any substance INDICATIONS:heme-positive stool. MEDICATIONS: Monitored anesthesia care  DESCRIPTION OF PROCEDURE:    Physical exam was performed.  Informed consent was obtained from the patient after explaining the benefits, risks, and alternatives to procedure.  The patient was connected to monitor and placed in left lateral position. Continuous oxygen was provided by nasal cannula and IV medicine administered through an indwelling cannula.  After administration of sedation and rectal exam, the patients rectum was intubated and the     colonoscope was advanced under direct visualization to the cecum.  The scope was removed slowly by carefully examining the color, texture, anatomy, and integrity mucosa on the way out.  The patient was recovered in endoscopy and discharged home in satisfactory condition.    COLON FINDINGS: A sessile polyp measuring 1.2 cm in size was found at the cecum.  A polypectomy was performed using snare cautery.  , Three sessile polyps ranging from 8 to 16mm in size were found in the descending colon.  A polypectomy was performed using snare cautery.  Bleeding at the site was controlled using hemoclips.  Two (2) placements were made.  A SPOT tattooCha [facility]) was applied.  A 1:10,000 Epinephrine (2 CC) solution injection was given to control bleeding.  , The colon was redundant.  Manual abdominal counter-pressure was used to reach the cecum, and There was moderate diverticulosis noted throughout the entire examined colon. EXTERNAL HEMIORRHOIDS  PREP QUALITY: fair. CECAL W/D TIME: 36 MINS          COMPLICATIONS: None  ENDOSCOPIC IMPRESSION: 1.   FOUR LARGE COLONPOLYPS REMOVED 2.   The LEFT colon IS redundant 3.   Moderate diverticulosis throughout the entire examined colon  RECOMMENDATIONS: HOLD ASPIRIN.  RESTART ON DEC 23. NO MRI FOR 30 DAYS. CONTINUE YOUR WEIGHT LOSS EFFORTS.  LOSE 10 MORE LBS. DRINK WATER TO KEEP YOUR URINE LIGHT YELLOW. AVOID ITEMS THAT TRIGGER GASTRITIS.  SEE INFO BELOW. FOLLOW A HIGH FIBER/LOW FAT DIET.  AVOID ITEMS THAT CAUSE BLOATING.  AWAIT BIOPSY Next colonoscopy in 1-2 MOS WITH PROPOFOL AND 2 DAY BOWEL PREP. FOLLOW UP IN 6 MOS     _______________________________ eSignedBarney Drain, MD Aug 29, 2014 3:25 PM re CPT CODES: ICD CODES:  The ICD and CPT codes recommended by this software are interpretations from the data that the clinical staff has captured with the software.  The verification of the translation of this report to the ICD and CPT codes and modifiers is the sole responsibility of the health care institution and practicing physician where this report was generated.  [employer]. will not be held responsible for the validity of the ICD and CPT codes included on this report.  AMA assumes no liability for data contained or not contained herein. CPT is a Designer, television/film set of the [employer].

## 2014-08-29 ENCOUNTER — Other Ambulatory Visit: Payer: Self-pay

## 2014-08-29 ENCOUNTER — Telehealth: Payer: Self-pay | Admitting: Gastroenterology

## 2014-08-29 DIAGNOSIS — D131 Benign neoplasm of stomach: Secondary | ICD-10-CM

## 2014-08-29 NOTE — Telephone Encounter (Signed)
PLEASE CALL PT. 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  HE NEEDS CHROMOGRANIN A BLOOD TESTS AND 24 HR URINE FOR 5-HIAA. HE NEEDS TO SEE AN ONCOLOGY DOCTOR TO ADDRESS TREATING THE TUMORS.   HIS TCS SHOWED AN ADVANCED POLYP AND ONE SIMPLE ADENOMA. HE SHOULD   HE MAY RESTART ON DEC 23. NO MRI UNTIL AFTER Sep 14, 2014. CONTINUE WEIGHT LOSS EFFORTS. LOSE 10 MORE LBS. DRINK WATER TO KEEP YOUR URINE LIGHT YELLOW. AVOID ITEMS THAT TRIGGER GASTRITIS.  FOLLOW A HIGH FIBER/LOW FAT DIET. AVOID ITEMS THAT CAUSE BLOATING.  Next colonoscopy in 1-2 MOS WITH PROPOFOL, DX: HEME POSITIVE STOOLS/ADVANCED COLON POLYP/POOR PREP DEC 2015.   FOLLOW UP IN 6 MOS E30 CARCINOID(STOMACH), ADVANCED POLYPS.Marland Kitchen

## 2014-08-30 ENCOUNTER — Other Ambulatory Visit: Payer: Self-pay

## 2014-08-30 DIAGNOSIS — D131 Benign neoplasm of stomach: Secondary | ICD-10-CM

## 2014-08-30 DIAGNOSIS — D3A00 Benign carcinoid tumor of unspecified site: Secondary | ICD-10-CM

## 2014-08-30 NOTE — Telephone Encounter (Signed)
Referral made to Oncology via EPIC to Dr. Whitney Muse at [facility]

## 2014-08-30 NOTE — Telephone Encounter (Signed)
ON RECALL LIST  °

## 2014-08-30 NOTE — Telephone Encounter (Signed)
I called pt and had him come by the office to explain his results and plan of care. He came by and is aware of all, he has the orders for his labs and knows he will need to pick up container for his urine at the lab. He said he will go to the lab Mon.  He is aware of the referral to Oncology and the repeat TCS in 1-2 months. ( on my to call list).  He also wanted me to call his younger brother, [name] [phone] ) and I did so and informed him of the plan. [name] is also listed as pt's emergency contact.   Routing to [place] for the Oncology referral.

## 2014-09-05 ENCOUNTER — Encounter: Payer: Self-pay | Admitting: Gastroenterology

## 2014-09-06 ENCOUNTER — Encounter (HOSPITAL_COMMUNITY): Payer: Self-pay | Admitting: Hematology & Oncology

## 2014-09-06 ENCOUNTER — Encounter (HOSPITAL_COMMUNITY): Payer: Medicaid Other | Attending: Hematology & Oncology | Admitting: Hematology & Oncology

## 2014-09-06 VITALS — BP 118/77 | HR 98 | Temp 97.8°F | Resp 18 | Wt 242.0 lb

## 2014-09-06 DIAGNOSIS — F1010 Alcohol abuse, uncomplicated: Secondary | ICD-10-CM

## 2014-09-06 DIAGNOSIS — K700 Alcoholic fatty liver: Secondary | ICD-10-CM | POA: Diagnosis not present

## 2014-09-06 DIAGNOSIS — D3A8 Other benign neuroendocrine tumors: Secondary | ICD-10-CM

## 2014-09-06 DIAGNOSIS — D3A092 Benign carcinoid tumor of the stomach: Secondary | ICD-10-CM

## 2014-09-06 DIAGNOSIS — D3A00 Benign carcinoid tumor of unspecified site: Secondary | ICD-10-CM

## 2014-09-06 NOTE — Patient Instructions (Signed)
[facility] Discharge Instructions  RECOMMENDATIONS MADE BY THE CONSULTANT AND ANY TEST RESULTS WILL BE SENT TO YOUR REFERRING PHYSICIAN.  I will see you back after your scan to review the results. Please call with any questions or problems prior to follow-up  Thank you for choosing [facility] to provide your oncology and hematology care.  To afford each patient quality time with our providers, please arrive at least 15 minutes before your scheduled appointment time.  With your help, our goal is to use those 15 minutes to complete the necessary work-up to ensure our physicians have the information they need to help with your evaluation and healthcare recommendations.    Effective January 1st, 2014, we ask that you re-schedule your appointment with our physicians should you arrive 10 or more minutes late for your appointment.  We strive to give you quality time with our providers, and arriving late affects you and other patients whose appointments are after yours.    Again, thank you for choosing [facility].  Our hope is that these requests will decrease the amount of time that you wait before being seen by our physicians.       _____________________________________________________________  Should you have questions after your visit to [facility], please contact our office at [phone] between the hours of 8:30 a.m. and 5:00 p.m.  Voicemails left after 4:30 p.m. will not be returned until the following business day.  For prescription refill requests, have your pharmacy contact our office with your prescription refill request.

## 2014-09-06 NOTE — Progress Notes (Signed)
[facility] CONSULT NOTE  Patient Care Team: [name], MD as PCP - General (Family Medicine) Danie Binder, MD (Gastroenterology)  CHIEF COMPLAINTS/PURPOSE OF CONSULTATION:   EGD with biopsy showing low grade neuroendocrine tumor Fatty liver ETOH abuse Elevated IgA with normal SPEP  HISTORY OF PRESENTING ILLNESS:  [name] 64 y.o. male is here because of a carcinoid tumor found on EGD. He denies any major problems. He is active, walks daily.  He has a good appetite.   MEDICAL HISTORY:  Past Medical History  Diagnosis Date  . Diverticulosis 2008 LGIB  . Gout   . HTN (hypertension)   . History of septic arthritis   . History of alcohol abuse   . Anemia FeDA: GASTRIC POLYPS, B12    TCS 2008 EGD 2009, 2008-HB 11.1 MCV 83.6 CR 1.22, 2009 FERRITIN 102-22  . Hepatomegaly 2o to FATTY LIVER DZ  . Chronic knee pain   . B12 deficiency   . Hypothyroidism     SURGICAL HISTORY: Past Surgical History  Procedure Laterality Date  . Upper gastrointestinal endoscopy  APR 2009    INFLAMED HYPERPLASTIC POLYPS, CHRONIC GASTRITIS  . Colonoscopy  2008 [facility] DJ    LGIB 2o to TICS, prep good  . Replacement total knee bilateral    . Hand surgery    . Cholecystectomy    . Esophagogastroduodenoscopy  12/08/2007    [id number] gastric polyps seen in the cardia and body of the stomach/Normal esophagus without evidence of Barrett's, mass, erosion/Normal duodenal bulb and second portion of the duodenum. Benign bx.  . Colonoscopy with propofol N/A 08/14/2014    Procedure: COLONOSCOPY WITH PROPOFOL at cecum at 0838; withdrawal time=36 minutes;  Surgeon: Danie Binder, MD;  Location: [facility];  Service: Endoscopy;  Laterality: N/A;  . Esophagogastroduodenoscopy (egd) with propofol N/A 08/14/2014    Procedure: ESOPHAGOGASTRODUODENOSCOPY (EGD) WITH PROPOFOL;  Surgeon: Danie Binder, MD;  Location: [facility];  Service: Endoscopy;  Laterality: N/A;  . Polypectomy  08/14/2014    Procedure:  POLYPECTOMY;  Surgeon: Danie Binder, MD;  Location: [facility];  Service: Endoscopy;;  . Esophageal biopsy  08/14/2014    Procedure: BIOPSY;  Surgeon: Danie Binder, MD;  Location: [facility];  Service: Endoscopy;;    SOCIAL HISTORY: History   Social History  . Marital Status: Single    Spouse Name: N/A    Number of Children: 0  . Years of Education: N/A   Occupational History  . retired    Social History Main Topics  . Smoking status: Former Smoker -- 0 years  . Smokeless tobacco: Not on file     Comment: Quit x 10 years/ never smoked on regular basis  . Alcohol Use: Yes     Comment: drinks on weekends, gin/vodka 1/5th. heavier in remote past.  . Drug Use: No  . Sexual Activity: No   Other Topics Concern  . Not on file   Social History Narrative   HE DOES NOT HAVE ANY CHILDREN.  Born in [place]. Used to grow tobacco. Also worked in Architect.  FAMILY HISTORY: Family History  Problem Relation Age of Onset  . Colon polyps Neg Hx   . Colon cancer Neg Hx   . Pancreatic disease Neg Hx   . Hypertension Sister   . Hypertension Brother   . Diabetes Brother    has no family status information on file.   Mother died at 84 Father died at 27 4 living siblings. One  brother died from complications of diabetes   ALLERGIES:  is allergic to aspirin.  MEDICATIONS:  Current Outpatient Prescriptions  Medication Sig Dispense Refill  . amLODipine (NORVASC) 10 MG tablet Take 10 mg by mouth daily.    . cyanocobalamin (,VITAMIN B-12,) 1000 MCG/ML injection Inject 1,000 mcg into the muscle every 30 (thirty) days.      . febuxostat (ULORIC) 40 MG tablet Take 40 mg by mouth daily.     Marland Kitchen levothyroxine (SYNTHROID, LEVOTHROID) 175 MCG tablet Take 175 mcg by mouth daily before breakfast.     . metoprolol succinate (TOPROL-XL) 25 MG 24 hr tablet Take 25 mg by mouth every morning.     Marland Kitchen omeprazole (PRILOSEC) 20 MG capsule Take 20 mg by mouth daily.      . tamsulosin (FLOMAX) 0.4 MG CAPS  capsule Take 0.4 mg by mouth daily.    . cephALEXin (KEFLEX) 500 MG capsule Take 1 capsule (500 mg total) by mouth 4 (four) times daily. 40 capsule 0  . HYDROcodone-acetaminophen (NORCO) 5-325 MG per tablet Take 1 tablet by mouth every 4 (four) hours as needed. 180 tablet 0   No current facility-administered medications for this visit.    Review of Systems  Constitutional: Negative.   HENT: Negative.   Eyes: Negative.   Respiratory: Negative.   Cardiovascular: Negative.   Gastrointestinal: Negative.   Genitourinary: Negative.   Musculoskeletal: Negative.   Skin: Negative.   Neurological: Negative.   Endo/Heme/Allergies: Negative.   Psychiatric/Behavioral: Negative.     PHYSICAL EXAMINATION: ECOG PERFORMANCE STATUS: 0 - Asymptomatic  Filed Vitals:   09/06/14 1400  BP: 118/77  Pulse: 98  Temp: 97.8 F (36.6 C)  Resp: 18   Filed Weights   09/06/14 1400  Weight: 242 lb (109.77 kg)     Physical Exam  Constitutional: He is oriented to person, place, and time and well-developed, well-nourished, and in no distress.  HENT:  Head: Normocephalic and atraumatic.  Nose: Nose normal.  Mouth/Throat: Oropharynx is clear and moist. No oropharyngeal exudate.  Eyes: Conjunctivae and EOM are normal. Pupils are equal, round, and reactive to light. Right eye exhibits no discharge. Left eye exhibits no discharge. No scleral icterus.  Neck: Normal range of motion. Neck supple. No tracheal deviation present. No thyromegaly present.  Cardiovascular: Normal rate, regular rhythm and normal heart sounds.  Exam reveals no gallop and no friction rub.   No murmur heard. Pulmonary/Chest: Effort normal and breath sounds normal. He has no wheezes. He has no rales.  Abdominal: Soft. Bowel sounds are normal. He exhibits no distension and no mass. There is no tenderness. There is no rebound and no guarding.  Musculoskeletal: Normal range of motion. He exhibits no edema.  Lymphadenopathy:    He has no  cervical adenopathy.  Neurological: He is alert and oriented to person, place, and time. He has normal reflexes. No cranial nerve deficit. Gait normal. Coordination normal.  Skin: Skin is warm and dry. No rash noted.  Psychiatric: Mood, memory, affect and judgment normal.  Nursing note and vitals reviewed.    LABORATORY DATA:  I have reviewed the data as listed Lab Results  Component Value Date   WBC 6.9 07/04/2014   HGB 14.3 09/11/2014   HCT 42.0 09/11/2014   MCV 90.1 07/04/2014   PLT 151 07/04/2014     Chemistry      Component Value Date/Time   NA 142 09/11/2014 1327   K 3.9 09/11/2014 1327   CL 106 09/11/2014 1327  CO2 27 08/09/2014 1105   BUN 17 09/11/2014 1327   CREATININE 1.20 09/11/2014 1327   CREATININE 1.30 02/24/2012 1023      Component Value Date/Time   CALCIUM 9.1 08/09/2014 1105   ALKPHOS 60 09/08/2012 1048   AST 93* 09/08/2012 1048   ALT 42 09/08/2012 1048   BILITOT 1.3* 09/08/2012 1048      Diagnosis 1. Colon, polyp(s), cecal - TUBULAR ADENOMA. NO HIGH GRADE DYSPLASIA OR MALIGNANCY IDENTIFIED. 2. Colon, polyp(s), descending - TUBULOVILLOUS ADENOMA. NO HIGH GRADE DYSPLASIA OR MALIGNANCY IDENTIFIED. 3. Stomach, polyp(s) - LOW GRADE NEUROENDOCRINE TUMOR. - INTESTINAL METAPLASIA AND INFLAMMATION CONSISTENT WITH CHRONIC ATROPHIC GASTRITIS. - SEE MICROSCOPIC DESCRIPTION. 4. Stomach, biopsy - FOCAL INVOLVEMENT BY LOW GRADE NEUROENDOCRINE TUMOR. - CHRONIC GASTRITIS. - SEE MICROSCOPIC DESCRIPTION. Microscopic Comment 3. Within the stroma there are sheets and nests of epithelial cells with round to oval, relatively uniform nuclei and small nucleoli and the morphologic features are consistent with low grade neuroendocrine tumor (carcinoid tumor). Immunohistochemistry is attempted and the tumor is not sufficiently present in the immunohistochemical slides. There is also chronic inflammation with intestinal metaplasia consistent with chronic atrophic  gastritis. 4. There is chronic inflammation consistent with chronic gastritis. There is also focal involvement by similar appearing low grade neuroendocrine tumor (carcinoid tumor). Case discussed with Dr. Oneida Alar on 08/17/14. (JDP:gt, 08/15/14) Claudette Laws MD Pathologist, Electronic Signature (Case signed 08/17/2014)   ASSESSMENT & PLAN:  Neuroendocrine tumor Pleasant 64 year old male with a history of alcohol abuse, fatty liver, he underwent upper endoscopy with Dr. Oneida Alar. He had a gastric polyp that was removed that showed pathology consistent with a low-grade neuroendocrine tumor. In addition he had a biopsy of the stomach that showed focal involvement by low-grade neuroendocrine tumor. Based upon his pathology, there is concern of residual disease or more extensive disease within his stomach.  I recommended obtaining an octreotide scan in order to see if we can assess the extent of disease within the stomach. He will most likely need to be presented at tumor board for additional recommendations regarding how to assess the extent of his disease and the best way to appropriately ensure adequate treatment. I will plan on seeing him back after his imaging studies with additional recommendations to follow. I have also recommended checking several laboratory studies including a chromogranin A and serum serotonin.    Orders Placed This Encounter  Procedures  . NM OCTREOTIDE LOCALIZATION W/SPECT    Standing Status: Future     Number of Occurrences: 1     Standing Expiration Date: 11/08/2015    Order Specific Question:  Reason for Exam (SYMPTOM  OR DIAGNOSIS REQUIRED)    Answer:  recent small carcinoid removed on EGD    Order Specific Question:  Preferred imaging location?    Answer:  [facility]  . Serotonin serum    DO ON THE SAME DAY HAS HIS OCTREOTIDE SCAN.  HE JUST HAD BLOODWORK DONE ACROSS THE STREET AND DO NOT WANT TO RESTICK TODAY    Standing Status: Future     Number of  Occurrences:      Standing Expiration Date: 09/07/2015    All questions were answered. The patient knows to call the clinic with any problems, questions or concerns.    Molli Hazard, MD MD 09/25/2014 6:47 PM

## 2014-09-07 LAB — CHROMOGRANIN A: Chromogranin A: 24 ng/mL — ABNORMAL HIGH (ref <=15)

## 2014-09-09 LAB — 5 HIAA, QUANTITATIVE, URINE, 24 HOUR: 5-HIAA, 24 Hr Urine: 2.6 mg/24 h (ref <=6.0)

## 2014-09-11 ENCOUNTER — Emergency Department (HOSPITAL_COMMUNITY): Payer: Medicare Other

## 2014-09-11 ENCOUNTER — Emergency Department (HOSPITAL_COMMUNITY)
Admission: EM | Admit: 2014-09-11 | Discharge: 2014-09-11 | Disposition: A | Discharge status: Home / Self Care | Payer: Medicare Other | Attending: Emergency Medicine | Admitting: Emergency Medicine

## 2014-09-11 ENCOUNTER — Encounter (HOSPITAL_COMMUNITY): Payer: Self-pay | Admitting: Cardiology

## 2014-09-11 DIAGNOSIS — Z8739 Personal history of other diseases of the musculoskeletal system and connective tissue: Secondary | ICD-10-CM | POA: Diagnosis not present

## 2014-09-11 DIAGNOSIS — I10 Essential (primary) hypertension: Secondary | ICD-10-CM | POA: Insufficient documentation

## 2014-09-11 DIAGNOSIS — Z79899 Other long term (current) drug therapy: Secondary | ICD-10-CM | POA: Insufficient documentation

## 2014-09-11 DIAGNOSIS — Z87891 Personal history of nicotine dependence: Secondary | ICD-10-CM | POA: Insufficient documentation

## 2014-09-11 DIAGNOSIS — G8929 Other chronic pain: Secondary | ICD-10-CM | POA: Diagnosis not present

## 2014-09-11 DIAGNOSIS — K4090 Unilateral inguinal hernia, without obstruction or gangrene, not specified as recurrent: Secondary | ICD-10-CM | POA: Insufficient documentation

## 2014-09-11 DIAGNOSIS — Z862 Personal history of diseases of the blood and blood-forming organs and certain disorders involving the immune mechanism: Secondary | ICD-10-CM | POA: Insufficient documentation

## 2014-09-11 DIAGNOSIS — E538 Deficiency of other specified B group vitamins: Secondary | ICD-10-CM | POA: Diagnosis not present

## 2014-09-11 DIAGNOSIS — M109 Gout, unspecified: Secondary | ICD-10-CM | POA: Diagnosis not present

## 2014-09-11 DIAGNOSIS — E039 Hypothyroidism, unspecified: Secondary | ICD-10-CM | POA: Insufficient documentation

## 2014-09-11 DIAGNOSIS — N390 Urinary tract infection, site not specified: Secondary | ICD-10-CM | POA: Diagnosis not present

## 2014-09-11 LAB — URINALYSIS, ROUTINE W REFLEX MICROSCOPIC
BILIRUBIN URINE: NEGATIVE
Glucose, UA: NEGATIVE mg/dL
Ketones, ur: NEGATIVE mg/dL
NITRITE: NEGATIVE
SPECIFIC GRAVITY, URINE: 1.02 (ref 1.005–1.030)
Urobilinogen, UA: 0.2 mg/dL (ref 0.0–1.0)
pH: 6 (ref 5.0–8.0)

## 2014-09-11 LAB — URINE MICROSCOPIC-ADD ON

## 2014-09-11 LAB — I-STAT CHEM 8, ED
BUN: 17 mg/dL (ref 6–23)
CALCIUM ION: 1.21 mmol/L (ref 1.13–1.30)
Chloride: 106 mEq/L (ref 96–112)
Creatinine, Ser: 1.2 mg/dL (ref 0.50–1.35)
Glucose, Bld: 107 mg/dL — ABNORMAL HIGH (ref 70–99)
HCT: 42 % (ref 39.0–52.0)
Hemoglobin: 14.3 g/dL (ref 13.0–17.0)
Potassium: 3.9 mmol/L (ref 3.5–5.1)
SODIUM: 142 mmol/L (ref 135–145)
TCO2: 25 mmol/L (ref 0–100)

## 2014-09-11 MED ORDER — IOHEXOL 300 MG/ML  SOLN
50.0000 mL | Freq: Once | INTRAMUSCULAR | Status: AC | PRN
  Administered 2014-09-11: 50 mL via ORAL

## 2014-09-11 MED ORDER — SODIUM CHLORIDE 0.9 % IJ SOLN
INTRAMUSCULAR | Status: AC
  Filled 2014-09-11: qty 500

## 2014-09-11 MED ORDER — IOHEXOL 300 MG/ML  SOLN
100.0000 mL | Freq: Once | INTRAMUSCULAR | Status: AC | PRN
  Administered 2014-09-11: 100 mL via INTRAVENOUS

## 2014-09-11 MED ORDER — CEPHALEXIN 500 MG PO CAPS: 500.0000 mg | ORAL_CAPSULE | Freq: Four times a day (QID) | ORAL | Status: DC

## 2014-09-11 NOTE — Discharge Instructions (Signed)
°Emergency Department Resource Guide °1) Find a Doctor and Pay Out of Pocket °Although you won't have to find out who is covered by your insurance plan, it is a good idea to ask around and get recommendations. You will then need to call the office and see if the doctor you have chosen will accept you as a new patient and what types of options they offer for patients who are self-pay. Some doctors offer discounts or will set up payment plans for their patients who do not have insurance, but you will need to ask so you aren't surprised when you get to your appointment. ° °2) Contact Your Local Health Department °Not all health departments have doctors that can see patients for sick visits, but many do, so it is worth a call to see if yours does. If you don't know where your local health department is, you can check in your phone book. The CDC also has a tool to help you locate your state's health department, and many state websites also have listings of all of their local health departments. ° °3) Find a Walk-in Clinic °If your illness is not likely to be very severe or complicated, you may want to try a walk in clinic. These are popping up all over the country in pharmacies, drugstores, and shopping centers. They're usually staffed by nurse practitioners or physician assistants that have been trained to treat common illnesses and complaints. They're usually fairly quick and inexpensive. However, if you have serious medical issues or chronic medical problems, these are probably not your best option. ° °No Primary Care Doctor: °- Call Health Connect at  832-8000 - they can help you locate a primary care doctor that  accepts your insurance, provides certain services, etc. °- Physician Referral Service- 1-800-533-3463 ° °Chronic Pain Problems: °Organization         Address  Phone   Notes  °[facility]  ([phone]) [phone] Patients need to be referred by their primary care doctor.  ° °Medication  Assistance: °Organization         Address  Phone   Notes  °[place] [facility] [address] °[place], [place] [address] (336) 641-8030 --Must be a resident of [place] °-- Must have NO insurance coverage whatsoever (no Medicaid/ Medicare, etc.) °-- The pt. MUST have a primary care doctor that directs their care regularly and follows them in the community °  °[facility]  (866) 331-1348   °[employer]  (888) 892-1162   ° °Agencies that provide inexpensive medical care: °Organization         Address  Phone   Notes  °[facility]  (336) 832-8035   °[facility]    (336) 832-7272   °[facility] [address] °[place], [place] [address] (336) 832-4777   °[facility] [address], °[place] (336) 271-4999   °[facility]    (336) 373-0678   °[facility]    (336) 272-1050   °[facility] ° [address], [place] Phone:  (336) 832-4444, Fax:  (336) 832-4440 Hours of Operation:  9 am - 6 pm, M-F.  Also accepts Medicaid/Medicare and self-pay.  °[facility] ° [address],  Phone: (336) 832-3150, Fax: (336) [phone]. Hours of Operation:  8:30 am - 5:30 pm, M-F.  Also accepts Medicaid and self-pay.  °[facility] [place] [address], [place] Phone: (336) 878-6027   °[facility] [address], [place], [place] (336)723-1848, Ext. 123 Mondays & Thursdays: 7-9 AM.  First 15 patients are seen on a first come, first serve basis. °  ° °Medicaid-accepting [place] Providers: ° °Organization         Address  Phone   Notes  °[facility] [address], [place] (336) 641-2100 Also accepts self-pay patients.  °[facility] [address], [place] ° (336) 856-9996   °[facility] [address] [place] [address], [place]  (336) 288-8857   °[facility] [address] [place] [address], [place] (336) 299-7000   °Ve[facility] B[facility] [address], [place]  ° (336) 373-1557 Only accepts [place] Access Medicaid patients after they have their name applied to their card.  ° °Self-Pay (no insurance) in [place]: ° °Organization         Address  Phone   Notes  °Sickle Cell Patients, [facility] [address], [place] (336) 832-1970   °[facility] [address], [place] (336) 832-4400   °[facility] [place] ° [address], [place] (336) 992-4800   °[facility]/Dr. Osei-Bonsu ° [address] [place] [address], [place] or [address], [place] (336) [phone] Phone number for both [place] and [place] locations is the same.  °[facility] [address], [place] (336) 299-0000   °[facility] [address] [place] [address], [place] or [address] (336) 852-7530 °(336) 878-2260   °[facility] [address], [place] (336) 350-1642, phone; (336) 294-5005, fax Sees patients 1st and 3rd Saturday of every month.  Must not qualify for public or private insurance (i.e. Medicaid, Medicare, [place] Health Choice, Veterans' Benefits) • Household income should be no more than 200% of the poverty level •The clinic cannot treat you if you are pregnant or think you are pregnant • Sexually transmitted diseases are not treated at the clinic.  ° ° °Dental Care: °Organization         Address  Phone  Notes  °[place] [facility] [address], [place] (336) 641-6152 Accepts children up to age 21 who are enrolled in Medicaid or [place] Health Choice; pregnant women with a Medicaid card; and children who have applied for Medicaid or [place] Health Choice, but were declined, whose parents can pay a reduced fee at time of service.  °[place]  [facility] [place]  [address], [place] (336) 641-7733 Accepts children up to age 21 who are enrolled in Medicaid or [place] Health Choice; pregnant women with a Medicaid card; and children who have applied for Medicaid or [place] Health Choice, but were declined, whose parents can pay a reduced fee at time of service.  °[facility] ° [address], [place] (336) 641-4533 Patients are seen by appointment only. Walk-ins are not accepted. [facility] will see patients 18 years of age and older. °Monday - Tuesday (8am-5pm) °Most Wednesdays (8:30-5pm) °$30 per visit, cash only  °[facility] ° [address], [place] (336) 641-4533 Patients are seen by appointment only. Walk-ins are not accepted. [facility] will see patients 18 years of age and older. °One   Wednesday Evening (Monthly: Volunteer Based).  $30 per visit, cash only  °[facility]  (919) 537-3737 for adults; Children under age 4, call Graduate Pediatric Dentistry at (919) [phone]. Children aged 4-14, please call (919) [phone] to request a pediatric application. ° Dental services are provided in all areas of dental care including fillings, crowns and bridges, complete and partial dentures, implants, gum treatment, root canals, and extractions. Preventive care is also provided. Treatment is provided to both adults and children. °Patients are selected via a lottery and there is often a waiting list. °  °[facility] [address], °[place] ° (336) 763-8833 [email or web address].drcivils.[email or web address] °  °[facility] [address], [place], [place] (336)723-1848, Ext. 123 Second and Fourth Thursday of each month, opens at 6:30 AM; Clinic ends at 9 AM.  Patients are seen on a first-come first-served basis, and a limited number are seen during each clinic.  ° °[facility] ° [address], [place], [place] (336) [phone]    Eligibility Requirements °You must have lived in [place], [place], or [place] counties for at least the last three months. °  You cannot be eligible for state or federal sponsored healthcare insurance, including Veterans Administration, Medicaid, or Medicare. °  You generally cannot be eligible for healthcare insurance through your employer.  °  How to apply: °Eligibility screenings are held every Tuesday and Wednesday afternoon from 1:00 pm until 4:00 pm. You do not need an appointment for the interview!  °[facility] [address], [place], [place] 336-631-2330   °[facility]  336-342-8273   °[facility]  336-703-3100   °[facility]  336-570-6415   ° °Behavioral Health Resources in the Community: °Intensive Outpatient Programs °Organization         Address  Phone  Notes  °[facility] [address], [place], [place] 336-878-6098   °[facility] [address], [place], [place] 336-832-9800   °[facility] [address], [place], [place] ° 336-882-2125   °[facility] [address],  °[place], [place] 1-800-853-5163 or 336-641-4981   °Substance Abuse Resources °Organization         Address  Phone  Notes  °[facility]  336-882-2125   °[facility]  336-784-9470   °[facility]  336-285-9073   °[facility]  336-845-3988   °Residential & Outpatient Substance Abuse Program  1-800-659-3381   °Psychological Services °Organization         Address  Phone  Notes  °[facility]  336- 832-9600   °[facility]  336- 378-7881   °[facility] [address], [place] 1-800-853-5163 or 336-641-4981   ° °Mobile Crisis Teams °Organization         Address  Phone  Notes  °[facility], Mobile Crisis Care Unit  1-877-626-1772   °Assertive °[facility] ° [address]  [place], [place] 336-834-9664   °Sharon DeEsch [address] °[place] 336-554-5454   ° °Self-Help/Support Groups °Organization         Address  Phone             Notes  °[facility] [place] - variety of support groups  336- 373-1402 Call for more information  °Narcotics Anonymous (NA), [facility] [address], °[place]  2 meetings at this location  ° °  Residential Treatment Programs Organization         Address  Phone  Notes  [facility] [address],    [place]  1-(858)336-8833   [facility]  [address], [place] [id number], [place], [place]   [facility] [place], [place] 918-344-9919 Admissions: 8am-3pm M-F  [facility] [place] [address]    [place], [place] [phone]   [facility] [address] [place], [place], [place]   [facility] [address],  [place], [place]   [facility] - Intensive Outpatient [address], [place], [place]   [facility] [place]  [place] [employer], [place] 1-412-276-6501 or 520-464-5943   [facility] [address], [place], [place] Accepts Medicaid  [facility] [address],  [place] 1-(801) 355-0448 Substance Abuse/Addiction Treatment   [facility]         Address  Phone  Notes  [facility]  [phone]   Domenic Schwab, PhD [address] [place], [place]   541-592-1646 or 9544527048   [place], [place] (239)115-6979   [facility] [address] [place], [place] 832-344-1958 Insurance/Medicaid/sponsorship through [facility] [address]                                    [place], [place] (304)[phone] [place] [address], [place] (978)[phone]    Dr. Adele Schilder  [phone]   [facility] Dept. 1) [address], [place] 2) [place] 3)  [address], [place] [phone]  ([phone]   [place] 516-660-2636 or 360-133-3247 (After Hours)      Take the prescription as directed.  Call your regular medical doctor today to schedule a follow up appointment within the next 2 days. Call the General Surgeon today to schedule a follow up appointment within the next week.  Return to the Emergency Department immediately sooner if worsening.

## 2014-09-11 NOTE — ED Provider Notes (Signed)
CSN: [id number]     Arrival date & time 09/11/14  1054 History   First MD Initiated Contact with Patient 09/11/14 1232     Chief Complaint  Patient presents with  . Hernia     HPI Pt was seen at 1325. Per pt, c/o gradual onset and persistence of constant left groin "swelling" for the past 3 to 4 months. Pt states he has been evaluated by his PMD for same, dx "hernia." States he has been experiencing intermittent pain in the area for the past 3 to 4 months, and "think it's getting bigger" over time. Denies any new symptoms today. Denies N/V/D, no testicular pain/swelling, no dysuria/hematuria, no abd pain, no back pain, no fevers, no rash.    Past Medical History  Diagnosis Date  . Diverticulosis 2008 LGIB  . Gout   . HTN (hypertension)   . History of septic arthritis   . History of alcohol abuse   . Anemia FeDA: GASTRIC POLYPS, B12    TCS 2008 EGD 2009, 2008-HB 11.1 MCV 83.6 CR 1.22, 2009 FERRITIN 102-22  . Hepatomegaly 2o to FATTY LIVER DZ  . Chronic knee pain   . B12 deficiency   . Hypothyroidism    Past Surgical History  Procedure Laterality Date  . Upper gastrointestinal endoscopy  APR 2009    INFLAMED HYPERPLASTIC POLYPS, CHRONIC GASTRITIS  . Colonoscopy  2008 [facility] DJ    LGIB 2o to TICS, prep good  . Replacement total knee bilateral    . Hand surgery    . Cholecystectomy    . Esophagogastroduodenoscopy  12/08/2007    [id number] gastric polyps seen in the cardia and body of the stomach/Normal esophagus without evidence of Barrett's, mass, erosion/Normal duodenal bulb and second portion of the duodenum. Benign bx.  . Colonoscopy with propofol N/A 08/14/2014    Procedure: COLONOSCOPY WITH PROPOFOL at cecum at 0838; withdrawal time=36 minutes;  Surgeon: Danie Binder, MD;  Location: [facility];  Service: Endoscopy;  Laterality: N/A;  . Esophagogastroduodenoscopy (egd) with propofol N/A 08/14/2014    Procedure: ESOPHAGOGASTRODUODENOSCOPY (EGD) WITH PROPOFOL;  Surgeon: Danie Binder, MD;  Location: [facility];  Service: Endoscopy;  Laterality: N/A;  . Polypectomy  08/14/2014    Procedure: POLYPECTOMY;  Surgeon: Danie Binder, MD;  Location: [facility];  Service: Endoscopy;;  . Esophageal biopsy  08/14/2014    Procedure: BIOPSY;  Surgeon: Danie Binder, MD;  Location: [facility];  Service: Endoscopy;;   Family History  Problem Relation Age of Onset  . Colon polyps Neg Hx   . Colon cancer Neg Hx   . Pancreatic disease Neg Hx   . Hypertension Sister   . Hypertension Brother   . Diabetes Brother    History  Substance Use Topics  . Smoking status: Former Smoker -- 0 years  . Smokeless tobacco: Not on file     Comment: Quit x 10 years/ never smoked on regular basis  . Alcohol Use: Yes     Comment: drinks on weekends, gin/vodka 1/5th. heavier in remote past.    Review of Systems ROS: Statement: All systems negative except as marked or noted in the HPI; Constitutional: Negative for fever and chills. ; ; Eyes: Negative for eye pain, redness and discharge. ; ; ENMT: Negative for ear pain, hoarseness, nasal congestion, sinus pressure and sore throat. ; ; Cardiovascular: Negative for chest pain, palpitations, diaphoresis, dyspnea and peripheral edema. ; ; Respiratory: Negative for cough, wheezing and stridor. ; ; Gastrointestinal: +left  inguinal "lump." Negative for nausea, vomiting, diarrhea, blood in stool, hematemesis, jaundice and rectal bleeding. . ; ; Genitourinary: Negative for dysuria, flank pain and hematuria. ; ; Genital:  No penile drainage or rash, no testicular pain or swelling, no scrotal rash or swelling. ;; Musculoskeletal: Negative for back pain and neck pain. Negative for swelling and trauma.; ; Skin: Negative for pruritus, rash, abrasions, blisters, bruising and skin lesion.; ; Neuro: Negative for headache, lightheadedness and neck stiffness. Negative for weakness, altered level of consciousness , altered mental status, extremity weakness, paresthesias, involuntary  movement, seizure and syncope.     Allergies  Aspirin  Home Medications   Prior to Admission medications   Medication Sig Start Date End Date Taking? Authorizing Provider  amLODipine (NORVASC) 10 MG tablet Take 10 mg by mouth daily.   Yes Historical Provider, MD  febuxostat (ULORIC) 40 MG tablet Take 40 mg by mouth daily.    Yes Historical Provider, MD  HYDROcodone-acetaminophen (NORCO) 5-325 MG per tablet Take 1 tablet by mouth every 4 (four) hours as needed. Patient taking differently: Take 1 tablet by mouth every 4 (four) hours as needed (pain).  08/06/14  Yes Carole Civil, MD  levothyroxine (SYNTHROID, LEVOTHROID) 175 MCG tablet Take 175 mcg by mouth daily before breakfast.  05/02/14  Yes Historical Provider, MD  metoprolol succinate (TOPROL-XL) 25 MG 24 hr tablet Take 25 mg by mouth every morning.    Yes Historical Provider, MD  omeprazole (PRILOSEC) 20 MG capsule Take 20 mg by mouth daily.     Yes Historical Provider, MD  tamsulosin (FLOMAX) 0.4 MG CAPS capsule Take 0.4 mg by mouth daily.   Yes Historical Provider, MD  cyanocobalamin (,VITAMIN B-12,) 1000 MCG/ML injection Inject 1,000 mcg into the muscle every 30 (thirty) days.      Historical Provider, MD   BP 135/72 mmHg  Pulse 87  Temp(Src) 98.6 F (37 C) (Oral)  Resp 20  Ht 6\' 1"  (1.854 m)  Wt 245 lb (111.131 kg)  BMI 32.33 kg/m2  SpO2 99% Physical Exam  1330: Physical examination:  Nursing notes reviewed; Vital signs and O2 SAT reviewed;  Constitutional: Well developed, Well nourished, Well hydrated, In no acute distress; Head:  Normocephalic, atraumatic; Eyes: EOMI, PERRL, No scleral icterus; ENMT: Mouth and pharynx normal, Mucous membranes moist; Neck: Supple, Full range of motion, No lymphadenopathy; Cardiovascular: Regular rate and rhythm, No gallop; Respiratory: Breath sounds clear & equal bilaterally, No wheezes.  Speaking full sentences with ease, Normal respiratory effort/excursion; Chest: Nontender, Movement  normal; Abdomen: Soft, Nontender, Nondistended, Normal bowel sounds. +left inguinal hernia, NT to palp, no overlying abd wall erythema, edema, or ecchymosis.; Genitourinary: No CVA tenderness. No scrotal rash, no scrotal edema.; Extremities: Pulses normal, No tenderness, No edema, No calf edema or asymmetry.; Neuro: AA&Ox3, Major CN grossly intact.  Speech clear. No gross focal motor or sensory deficits in extremities.; Skin: Color normal, Warm, Dry.   ED Course  Procedures     EKG Interpretation None      MDM  MDM Reviewed: previous chart, nursing note and vitals Reviewed previous: labs Interpretation: labs and CT scan     Results for orders placed or performed during the hospital encounter of 09/11/14  Urinalysis, Routine w reflex microscopic  Result Value Ref Range   Color, Urine YELLOW YELLOW   APPearance CLEAR CLEAR   Specific Gravity, Urine 1.020 1.005 - 1.030   pH 6.0 5.0 - 8.0   Glucose, UA NEGATIVE NEGATIVE mg/dL  Hgb urine dipstick SMALL (A) NEGATIVE   Bilirubin Urine NEGATIVE NEGATIVE   Ketones, ur NEGATIVE NEGATIVE mg/dL   Protein, ur TRACE (A) NEGATIVE mg/dL   Urobilinogen, UA 0.2 0.0 - 1.0 mg/dL   Nitrite NEGATIVE NEGATIVE   Leukocytes, UA SMALL (A) NEGATIVE  Urine microscopic-add on  Result Value Ref Range   Squamous Epithelial / LPF MANY (A) RARE   WBC, UA TOO NUMEROUS TO COUNT <3 WBC/hpf   RBC / HPF 7-10 <3 RBC/hpf   Bacteria, UA MANY (A) RARE   Urine-Other AMORPHOUS URATES/PHOSPHATES   I-stat Chem 8, ED  Result Value Ref Range   Sodium 142 135 - 145 mmol/L   Potassium 3.9 3.5 - 5.1 mmol/L   Chloride 106 96 - 112 mEq/L   BUN 17 6 - 23 mg/dL   Creatinine, Ser 1.20 0.50 - 1.35 mg/dL   Glucose, Bld 107 (H) 70 - 99 mg/dL   Calcium, Ion 1.21 1.13 - 1.30 mmol/L   TCO2 25 0 - 100 mmol/L   Hemoglobin 14.3 13.0 - 17.0 g/dL   HCT 42.0 39.0 - 52.0 %   Ct Abdomen Pelvis W Contrast 09/11/2014   CLINICAL DATA:  Left groin hernia getting bigger, intermittent  pain  EXAM: CT ABDOMEN AND PELVIS WITH CONTRAST  TECHNIQUE: Multidetector CT imaging of the abdomen and pelvis was performed using the standard protocol following bolus administration of intravenous contrast.  CONTRAST:  51mL OMNIPAQUE IOHEXOL 300 MG/ML SOLN, 111mL OMNIPAQUE IOHEXOL 300 MG/ML SOLN  COMPARISON:  09/08/2013  FINDINGS: Lung bases shows mild atelectasis or scarring right base anteriorly. Mild prominent fat pad noted than right anterior cardiophrenic angle.  Sagittal images of the spine shows diffuse osteopenia. There are degenerative changes thoracolumbar spine.  There is intramuscular lipoma right anterolateral abdominal wall just anterior to iliac bone measures 6 cm AP diameter by 2.7 cm thickness. This is stable in size in appearance from prior exam.  Enhanced liver is unremarkable. The patient is status postcholecystectomy. The pancreas, spleen and adrenal glands are unremarkable. No aortic aneurysm. Mild atherosclerotic calcifications distal abdominal aorta and iliac arteries are noted. Stable sebaceous cyst in left anterior abdominal wall superficial just lateral to umbilical region measures 2.6 cm.  At there is no small bowel obstruction. No ascites or free air. No adenopathy. Kidneys are symmetrical in size and enhancement. No hydronephrosis or hydroureter.  Delayed renal images shows bilateral renal symmetrical excretion. Bilateral visualized proximal ureter is unremarkable. No pericecal inflammation. Normal retrocecal appendix. Right colon diverticula are noted. Scattered diverticula are noted transverse colon and descending colon. Multiple sigmoid colon diverticula. No evidence of acute diverticulitis. Prostate gland measures 5.4 x 5.1 cm. Again noted thickening of urinary bladder wall consistent with chronic cystitis or chronic inflammatory changes.  There is a left inguinal scrotal canal hernia containing fat measures 5.8 by 6 cm. This is best seen in axial image 79. There is no evidence of  acute complication. In sagittal image 72 0 the hernia measures 6.5 cm cranial caudally.  No destructive bony lesions are noted within pelvis. No inguinal adenopathy.  IMPRESSION: 1. There is a left inguinal canal hernia measures 6.1 x 5.7 x 6.5 cm. The hernia is containing fat without evidence of acute complication. 2. Stable intramuscular lipoma in right lower anterior lateral abdominal wall just anterior to iliac bone measures 6 x 2.7 cm. 3. Status postcholecystectomy. 4. No hydronephrosis or hydroureter. 5. No pericecal inflammation.  Normal appendix. 6. Right colon diverticula are noted. Scattered diverticula are  noted transverse colon and descending colon. Multiple sigmoid colon diverticula. No evidence of acute diverticulitis. 7. Mild enlarged prostate gland measures 5.4 x 5.1 cm. Again noted thickening of urinary bladder wall. This is consistent with chronic inflammation or cystitis.   Electronically Signed   By: Lahoma Crocker M.D.   On: 09/11/2014 15:47    1555:  CT scan without acute surgical process; pt will need to f/u as outpt with General Surgeon for elective repair. Will tx for UTI with keflex while UC is pending. Pt states he wants to go home now. Dx and testing d/w pt.  Questions answered.  Verb understanding, agreeable to d/c home with outpt f/u.   Francine Graven, DO 09/13/14 2122

## 2014-09-11 NOTE — ED Notes (Signed)
Hernia left groin.  States it is " getting bigger". Denies any pain.

## 2014-09-13 LAB — URINE CULTURE

## 2014-09-14 ENCOUNTER — Telehealth (HOSPITAL_BASED_OUTPATIENT_CLINIC_OR_DEPARTMENT_OTHER): Payer: Self-pay | Admitting: Emergency Medicine

## 2014-09-14 NOTE — Telephone Encounter (Signed)
Post ED Visit - Positive Culture Follow-up: Successful Patient Follow-Up  Culture assessed and recommendations reviewed by: []  Wes Enhaut, Pharm.D., BCPS []  Heide Guile, Pharm.D., BCPS []  Alycia Rossetti, Pharm.D., BCPS [x]  [place], Pharm.D., BCPS, AAHIVP []  Legrand Como, Pharm.D., BCPS, AAHIVP []  Hassie Bruce, Pharm.D. []  Milus Glazier, [place].D.  Positive urine culture  []  Patient discharged without antimicrobial prescription and treatment is now indicated [x]  Organism is resistant to prescribed ED discharge antimicrobial []  Patient with positive blood cultures  Changes discussed with ED provider: Starlyn Skeans PA New antibiotic prescription Macrobid 100 mg PO BID x seven days Called to [place] [phone]  Contacted patient, date 09/14/14, time 1015   Ernesta Amble 09/14/2014, 10:21 AM

## 2014-09-14 NOTE — Progress Notes (Signed)
ED Antimicrobial Stewardship Positive Culture Follow Up   [name] is an 64 y.o. male who presented to [facility] on 09/11/2014 with a chief complaint of  Chief Complaint  Patient presents with  . Hernia    Recent Results (from the past 720 hour(s))  Urine culture     Status: None   Collection Time: 09/11/14  2:54 PM  Result Value Ref Range Status   Specimen Description URINE, CLEAN CATCH  Final   Special Requests NONE  Final   Colony Count   Final    >=100,000 COLONIES/ML Performed at [facility]    Culture   Final    ESCHERICHIA COLI Performed at [facility]    Report Status 09/13/2014 FINAL  Final   Organism ID, Bacteria ESCHERICHIA COLI  Final      Susceptibility   Escherichia coli - MIC*    AMPICILLIN >=32 RESISTANT Resistant     CEFAZOLIN >=64 RESISTANT Resistant     CEFTRIAXONE <=1 SENSITIVE Sensitive     CIPROFLOXACIN >=4 RESISTANT Resistant     GENTAMICIN <=1 SENSITIVE Sensitive     LEVOFLOXACIN >=8 RESISTANT Resistant     NITROFURANTOIN <=16 SENSITIVE Sensitive     TOBRAMYCIN <=1 SENSITIVE Sensitive     TRIMETH/SULFA >=320 RESISTANT Resistant     PIP/TAZO 16 SENSITIVE Sensitive     * ESCHERICHIA COLI    [x]  Treated with keflex, organism resistant to prescribed antimicrobial 64 yo who was seen in the ED for left groin swelling. A UA was done and showed some WBCs. He was sent out on keflex for UTI. Culture is now back with e.coli that is resistant to it.   New antibiotic prescription:  Dc keflex Start Macrobid 100mg  PO BID x7 days  ED Provider: Starlyn Skeans, PA  Onnie Boer, PharmD Pager: [phone] Infectious Diseases Pharmacist Phone# [phone]

## 2014-09-17 ENCOUNTER — Other Ambulatory Visit: Payer: Self-pay | Admitting: *Deleted

## 2014-09-17 ENCOUNTER — Telehealth: Payer: Self-pay | Admitting: Orthopedic Surgery

## 2014-09-17 ENCOUNTER — Ambulatory Visit (HOSPITAL_COMMUNITY)
Admission: RE | Admit: 2014-09-17 | Discharge: 2014-09-17 | Disposition: A | Discharge status: Home / Self Care | Payer: Medicare Other | Source: Ambulatory Visit | Attending: Hematology & Oncology | Admitting: Hematology & Oncology

## 2014-09-17 ENCOUNTER — Encounter (HOSPITAL_COMMUNITY): Payer: Self-pay

## 2014-09-17 DIAGNOSIS — G8929 Other chronic pain: Secondary | ICD-10-CM

## 2014-09-17 DIAGNOSIS — D3A00 Benign carcinoid tumor of unspecified site: Secondary | ICD-10-CM

## 2014-09-17 DIAGNOSIS — D3A092 Benign carcinoid tumor of the stomach: Secondary | ICD-10-CM | POA: Diagnosis present

## 2014-09-17 MED ORDER — INDIUM IN-111 PENTETREOTIDE IV KIT
6.0000 | PACK | Freq: Once | INTRAVENOUS | Status: AC | PRN
  Administered 2014-09-17: 6 via INTRAVENOUS

## 2014-09-17 MED ORDER — HYDROCODONE-ACETAMINOPHEN 5-325 MG PO TABS: 1.0000 | ORAL_TABLET | ORAL | Status: DC | PRN

## 2014-09-17 NOTE — Telephone Encounter (Signed)
Prescription available, patient aware  

## 2014-09-17 NOTE — Telephone Encounter (Signed)
Patient is calling requesting pain medication refill HYDROcodone-acetaminophen (NORCO) 5-325 MG per tablet please advise?

## 2014-09-18 ENCOUNTER — Ambulatory Visit (HOSPITAL_COMMUNITY)
Admission: RE | Admit: 2014-09-18 | Discharge: 2014-09-18 | Disposition: A | Discharge status: Home / Self Care | Payer: Medicare Other | Source: Ambulatory Visit | Attending: Hematology & Oncology | Admitting: Hematology & Oncology

## 2014-09-18 NOTE — Telephone Encounter (Signed)
Patient picked up Rx

## 2014-09-19 ENCOUNTER — Encounter (HOSPITAL_COMMUNITY)
Admission: RE | Admit: 2014-09-19 | Discharge: 2014-09-19 | Disposition: A | Discharge status: Home / Self Care | Payer: Medicare Other | Source: Ambulatory Visit | Attending: Hematology & Oncology | Admitting: Hematology & Oncology

## 2014-09-19 DIAGNOSIS — D3A092 Benign carcinoid tumor of the stomach: Secondary | ICD-10-CM | POA: Diagnosis not present

## 2014-09-25 DIAGNOSIS — D3A092 Benign carcinoid tumor of the stomach: Secondary | ICD-10-CM

## 2014-09-25 DIAGNOSIS — D3A8 Other benign neuroendocrine tumors: Secondary | ICD-10-CM | POA: Insufficient documentation

## 2014-09-25 HISTORY — DX: Benign carcinoid tumor of the stomach: D3A.092

## 2014-09-25 NOTE — Assessment & Plan Note (Signed)
Pleasant 64 year old male with a history of alcohol abuse, fatty liver, he underwent upper endoscopy with Dr. Oneida Alar. He had a gastric polyp that was removed that showed pathology consistent with a low-grade neuroendocrine tumor. In addition he had a biopsy of the stomach that showed focal involvement by low-grade neuroendocrine tumor. Based upon his pathology, there is concern of residual disease or more extensive disease within his stomach.  I recommended obtaining an octreotide scan in order to see if we can assess the extent of disease within the stomach. He will most likely need to be presented at tumor board for additional recommendations regarding how to assess the extent of his disease and the best way to appropriately ensure adequate treatment. I will plan on seeing him back after his imaging studies with additional recommendations to follow. I have also recommended checking several laboratory studies including a chromogranin A and serum serotonin.

## 2014-09-27 NOTE — Progress Notes (Signed)
Philip Hampshire, MD [place] [address]  Neuroendocrine tumor  CURRENT THERAPY: To be presented at GI tumor conference.  INTERVAL HISTORY: [name] 64 y.o. male returns for followup of carcinoid tumor found on EGD.   I personally reviewed and went over laboratory results with the patient.  The results are noted within this dictation.  I personally reviewed and went over radiographic studies with the patient.  The results are noted within this dictation.  Octreotide scan performed on 09/19/2014 was negative for any avid metastasis.  With this information, he will be presented at GI tumor in the future.  He appears to have localized disease per biopsy and staging scans.  We are interested in having surgical input regarding this patient's case.    He is asymptomatic without any nausea/vomiting, abdominal pain, decreased appetite, flushing, heart palpations, constipation, diarrhea.  Oncologically, he is well and has a negative ROS.  Past Medical History  Diagnosis Date  . Diverticulosis 2008 LGIB  . Gout   . HTN (hypertension)   . History of septic arthritis   . History of alcohol abuse   . Anemia FeDA: GASTRIC POLYPS, B12    TCS 2008 EGD 2009, 2008-HB 11.1 MCV 83.6 CR 1.22, 2009 FERRITIN 102-22  . Hepatomegaly 2o to FATTY LIVER DZ  . Chronic knee pain   . B12 deficiency   . Hypothyroidism     has GASTRIC POLYP; LIPOMA NOS; B12 DEFICIENCY; GOUT; ANEMIA, IRON DEFICIENCY; ABUSE, ALCOHOL, UNSPECIFIED; HYPERTENSION; GERD; PUD; DIVERTICULOSIS, COLON; IBS; FATTY LIVER DISEASE; HEMORRHAGE, GASTROINTESTINAL NOS; HYPRTRPHY PROSTATE BNG W/O URINARY OBST/LUTS; ABSCESS, GLUTEAL; OSTEOARTHRITIS; KNEE, ARTHRITIS, DEGEN./OSTEO; DEGENERATION, DISC NOS; IRON DEFICIENCY ANEMIA, HX OF; TOTAL KNEE FOLLOW-UP; Thrombocytopenia, unspecified; Abnormal LFTs; Heme positive stool; Elevated LFTs; and Neuroendocrine tumor on his problem list.     is allergic to aspirin.  [name] does not currently have medications on file.  Past Surgical History  Procedure Laterality Date  . Upper gastrointestinal endoscopy  APR 2009    INFLAMED HYPERPLASTIC POLYPS, CHRONIC GASTRITIS  . Colonoscopy  2008 [facility] DJ    LGIB 2o to TICS, prep good  . Replacement total knee bilateral    . Hand surgery    . Cholecystectomy    . Esophagogastroduodenoscopy  12/08/2007    [id number] gastric polyps seen in the cardia and body of the stomach/Normal esophagus without evidence of Barrett's, mass, erosion/Normal duodenal bulb and second portion of the duodenum. Benign bx.  . Colonoscopy with propofol N/A 08/14/2014    Procedure: COLONOSCOPY WITH PROPOFOL at cecum at 0838; withdrawal time=36 minutes;  Surgeon: Danie Binder, MD;  Location: [facility];  Service: Endoscopy;  Laterality: N/A;  . Esophagogastroduodenoscopy (egd) with propofol N/A 08/14/2014    Procedure: ESOPHAGOGASTRODUODENOSCOPY (EGD) WITH PROPOFOL;  Surgeon: Danie Binder, MD;  Location: [facility];  Service: Endoscopy;  Laterality: N/A;  . Polypectomy  08/14/2014    Procedure: POLYPECTOMY;  Surgeon: Danie Binder, MD;  Location: [facility];  Service: Endoscopy;;  . Esophageal biopsy  08/14/2014    Procedure: BIOPSY;  Surgeon: Danie Binder, MD;  Location: [facility];  Service: Endoscopy;;    Denies any headaches, dizziness, double vision, fevers, chills, night sweats, nausea, vomiting, diarrhea, constipation, chest pain, heart palpitations, shortness of breath, blood in stool, black tarry stool, urinary pain, urinary burning, urinary frequency, hematuria.   PHYSICAL EXAMINATION  ECOG PERFORMANCE STATUS: 0 - Asymptomatic  Filed Vitals:   09/28/14 1022  BP: 121/71  Pulse: 92  Temp: 99 F (37.2 C)  Resp: 18    GENERAL:alert, no distress, well nourished, well developed, comfortable, cooperative and smiling SKIN: skin color, texture, turgor are normal, no rashes or significant lesions HEAD: Normocephalic, No masses, lesions,  tenderness or abnormalities EYES: normal, PERRLA, EOMI, Conjunctiva are pink and non-injected EARS: External ears normal OROPHARYNX:lips, buccal mucosa, and tongue normal and mucous membranes are moist  NECK: supple, no adenopathy, thyroid normal size, non-tender, without nodularity, no stridor, non-tender, trachea midline LYMPH:  no palpable lymphadenopathy BREAST:not examined LUNGS: clear to auscultation  HEART: regular rate & rhythm ABDOMEN:abdomen soft, non-tender and normal bowel sounds BACK: Back symmetric, no curvature. EXTREMITIES:less then 2 second capillary refill, no joint deformities, effusion, or inflammation, no skin discoloration, no cyanosis  NEURO: alert & oriented x 3 with fluent speech, no focal motor/sensory deficits, gait normal   LABORATORY DATA: CBC    Component Value Date/Time   WBC 6.9 07/04/2014 0957   RBC 4.73 07/04/2014 0957   RBC 3.20* 03/17/2007 0405   HGB 14.3 09/11/2014 1327   HCT 42.0 09/11/2014 1327   HCT 44 01/05/2013   PLT 151 07/04/2014 0957   PLT 148 01/05/2013   MCV 90.1 07/04/2014 0957   MCV 90.4 01/05/2013   MCH 30.4 07/04/2014 0957   MCHC 33.8 07/04/2014 0957   RDW 14.9 07/04/2014 0957   LYMPHSABS 3.7 07/04/2014 0957   MONOABS 0.6 07/04/2014 0957   EOSABS 0.3 07/04/2014 0957   BASOSABS 0.0 07/04/2014 0957      Chemistry      Component Value Date/Time   NA 142 09/11/2014 1327   K 3.9 09/11/2014 1327   CL 106 09/11/2014 1327   CO2 27 08/09/2014 1105   BUN 17 09/11/2014 1327   CREATININE 1.20 09/11/2014 1327   CREATININE 1.30 02/24/2012 1023      Component Value Date/Time   CALCIUM 9.1 08/09/2014 1105   ALKPHOS 60 09/08/2012 1048   AST 93* 09/08/2012 1048   ALT 42 09/08/2012 1048   BILITOT 1.3* 09/08/2012 1048      Results for [name] (MRN [id number]) as of 09/28/2014 14:45  Ref. Range 09/03/2014 10:10  Chromogranin A Latest Range: <=15 ng/mL 24 (H)    RADIOGRAPHIC STUDIES:  Ct Abdomen Pelvis W  Contrast  09/11/2014   CLINICAL DATA:  Left groin hernia getting bigger, intermittent pain  EXAM: CT ABDOMEN AND PELVIS WITH CONTRAST  TECHNIQUE: Multidetector CT imaging of the abdomen and pelvis was performed using the standard protocol following bolus administration of intravenous contrast.  CONTRAST:  77mL OMNIPAQUE IOHEXOL 300 MG/ML SOLN, 187mL OMNIPAQUE IOHEXOL 300 MG/ML SOLN  COMPARISON:  09/08/2013  FINDINGS: Lung bases shows mild atelectasis or scarring right base anteriorly. Mild prominent fat pad noted than right anterior cardiophrenic angle.  Sagittal images of the spine shows diffuse osteopenia. There are degenerative changes thoracolumbar spine.  There is intramuscular lipoma right anterolateral abdominal wall just anterior to iliac bone measures 6 cm AP diameter by 2.7 cm thickness. This is stable in size in appearance from prior exam.  Enhanced liver is unremarkable. The patient is status postcholecystectomy. The pancreas, spleen and adrenal glands are unremarkable. No aortic aneurysm. Mild atherosclerotic calcifications distal abdominal aorta and iliac arteries are noted. Stable sebaceous cyst in left anterior abdominal wall superficial just lateral to umbilical region measures 2.6 cm.  At there is no small bowel obstruction. No ascites or free air. No adenopathy. Kidneys are symmetrical in size and enhancement.  No hydronephrosis or hydroureter.  Delayed renal images shows bilateral renal symmetrical excretion. Bilateral visualized proximal ureter is unremarkable. No pericecal inflammation. Normal retrocecal appendix. Right colon diverticula are noted. Scattered diverticula are noted transverse colon and descending colon. Multiple sigmoid colon diverticula. No evidence of acute diverticulitis. Prostate gland measures 5.4 x 5.1 cm. Again noted thickening of urinary bladder wall consistent with chronic cystitis or chronic inflammatory changes.  There is a left inguinal scrotal canal hernia containing  fat measures 5.8 by 6 cm. This is best seen in axial image 79. There is no evidence of acute complication. In sagittal image 72 0 the hernia measures 6.5 cm cranial caudally.  No destructive bony lesions are noted within pelvis. No inguinal adenopathy.  IMPRESSION: 1. There is a left inguinal canal hernia measures 6.1 x 5.7 x 6.5 cm. The hernia is containing fat without evidence of acute complication. 2. Stable intramuscular lipoma in right lower anterior lateral abdominal wall just anterior to iliac bone measures 6 x 2.7 cm. 3. Status postcholecystectomy. 4. No hydronephrosis or hydroureter. 5. No pericecal inflammation.  Normal appendix. 6. Right colon diverticula are noted. Scattered diverticula are noted transverse colon and descending colon. Multiple sigmoid colon diverticula. No evidence of acute diverticulitis. 7. Mild enlarged prostate gland measures 5.4 x 5.1 cm. Again noted thickening of urinary bladder wall. This is consistent with chronic inflammation or cystitis.   Electronically Signed   By: Lahoma Crocker M.D.   On: 09/11/2014 15:47   Nm Octreotide Localization W/spect  09/19/2014   CLINICAL DATA:  Small bowel carcinoid.  EXAM: NUCLEAR MEDICINE OCTREOTIDE (SOMATOSTATIN-RECEPTOR) SCAN  TECHNIQUE: Following intravenous administration of radiopharmaceutical, whole body images of the head, neck, trunk, and extremities were obtained on subsequent days.  RADIOPHARMACEUTICALS:  6.0 MCiIn-111 Octreotide  COMPARISON:  09/11/2014  FINDINGS: There is a physiologic distribution of the radiopharmaceutical within the liver, spleen, kidneys, GI and GU tract. No abnormal focus of increased uptake identified to suggest octreotide avid metastasis.  IMPRESSION: 1. Negative for octreotide avid metastasis.   Electronically Signed   By: Kerby Moors M.D.   On: 09/19/2014 15:29     ASSESSMENT AND PLAN:  Neuroendocrine tumor Pleasant 64 year old male with a history of alcohol abuse, fatty liver, he underwent upper  endoscopy with Dr. Oneida Alar. He had a gastric polyp that was removed that showed pathology consistent with a low-grade neuroendocrine tumor. In addition he had a biopsy of the stomach that showed focal involvement by low-grade neuroendocrine tumor. Based upon his pathology, there is concern of residual disease or more extensive disease within his stomach.  Octreotide scan on 09/19/2014 is negative for any avid metastasis.  He will need presented at GI tumor board to help further solidify recommendations.   Return in 3 weeks for follow-up.   THERAPY PLAN:  We will request this patient be presented at GI tumor board.  All questions were answered. The patient knows to call the clinic with any problems, questions or concerns. We can certainly see the patient much sooner if necessary.  Patient and plan discussed with Dr. Ancil Linsey and she is in agreement with the aforementioned.   KEFALAS,THOMAS 09/28/2014

## 2014-09-27 NOTE — Assessment & Plan Note (Addendum)
Pleasant 64 year old male with a history of alcohol abuse, fatty liver, he underwent upper endoscopy with Dr. Oneida Alar. He had a gastric polyp that was removed that showed pathology consistent with a low-grade neuroendocrine tumor. In addition he had a biopsy of the stomach that showed focal involvement by low-grade neuroendocrine tumor. Based upon his pathology, there is concern of residual disease or more extensive disease within his stomach.  Octreotide scan on 09/19/2014 is negative for any avid metastasis.  He will need presented at GI tumor board to help further solidify recommendations.   Return in 3 weeks for follow-up.

## 2014-09-28 ENCOUNTER — Encounter (HOSPITAL_COMMUNITY): Payer: Self-pay | Admitting: Oncology

## 2014-09-28 ENCOUNTER — Encounter (HOSPITAL_COMMUNITY): Payer: Medicare Other | Attending: Oncology | Admitting: Oncology

## 2014-09-28 ENCOUNTER — Ambulatory Visit (HOSPITAL_COMMUNITY): Payer: Medicaid Other | Admitting: Oncology

## 2014-09-28 VITALS — BP 121/71 | HR 92 | Temp 99.0°F | Resp 18 | Wt 244.6 lb

## 2014-09-28 DIAGNOSIS — D3A092 Benign carcinoid tumor of the stomach: Secondary | ICD-10-CM | POA: Diagnosis not present

## 2014-09-28 DIAGNOSIS — D3A8 Other benign neuroendocrine tumors: Secondary | ICD-10-CM

## 2014-09-28 NOTE — Patient Instructions (Signed)
[place] at [facility]  Discharge Instructions:  Return in 3 weeks for follow-up to see Dr. Whitney Muse. No restrictions from a [place] standpoint. Your case will be presented to the Gastrointestinal Tumor Board and multiple specialities will be reviewing your case and situation to make treatment recommendations.  Please call with any questions or concerns.  _______________________________________________________________  Thank you for choosing [place] at [facility] to provide your oncology and hematology care.  To afford each patient quality time with our providers, please arrive at least 15 minutes before your scheduled appointment.  You need to re-schedule your appointment if you arrive 10 or more minutes late.  We strive to give you quality time with our providers, and arriving late affects you and other patients whose appointments are after yours.  Also, if you no show three or more times for appointments you may be dismissed from the clinic.  Again, thank you for choosing [place] at [place] hope is that these requests will allow you access to exceptional care and in a timely manner. _______________________________________________________________  If you have questions after your visit, please contact our office at [phone] between the hours of 8:30 a.m. and 5:00 p.m. Voicemails left after 4:30 p.m. will not be returned until the following business day. _______________________________________________________________  For prescription refill requests, have your pharmacy contact our office. _______________________________________________________________  Recommendations made by the consultant and any test results will be sent to your referring physician. _______________________________________________________________

## 2014-10-17 ENCOUNTER — Encounter: Payer: Self-pay | Admitting: Gastroenterology

## 2014-10-17 ENCOUNTER — Ambulatory Visit (INDEPENDENT_AMBULATORY_CARE_PROVIDER_SITE_OTHER): Payer: Medicare Other | Admitting: Gastroenterology

## 2014-10-17 ENCOUNTER — Other Ambulatory Visit: Payer: Self-pay

## 2014-10-17 VITALS — BP 139/88 | HR 87 | Temp 98.6°F | Ht 72.0 in | Wt 242.0 lb

## 2014-10-17 DIAGNOSIS — D126 Benign neoplasm of colon, unspecified: Secondary | ICD-10-CM | POA: Insufficient documentation

## 2014-10-17 DIAGNOSIS — D489 Neoplasm of uncertain behavior, unspecified: Secondary | ICD-10-CM

## 2014-10-17 DIAGNOSIS — D3A8 Other benign neuroendocrine tumors: Secondary | ICD-10-CM

## 2014-10-17 NOTE — Assessment & Plan Note (Signed)
OCTREOTIDE SCAN NEG.  PT NEEDS EUS TO EVALUATE GASTRIC MUCOSA. ATTEMPTED TO DISCUSS WITH MR. KEFALAS BUT HE IS OOO UNTIL AFTER NEXT WED. DISCUSSED PROCEDURE, INDICATION, & BENEFITS OF EUS. PT NOT INTERESTED IN REFERRAL AT THIS TIME. FOLLOW UP IN 4 MOS.

## 2014-10-17 NOTE — Progress Notes (Signed)
Subjective:    Patient ID: [name], male    DOB: 11-Jun-1951, 64 y.o.   MRN: [id number] Philip Hampshire, MD  HPI Feeling fine. BmS: regular.  PT DENIES FEVER, CHILLS, HEMATOCHEZIA, nausea, vomiting, melena, diarrhea, CHEST PAIN, SHORTNESS OF BREATH,  constipation, abdominal pain, problems swallowing, problems with sedation, heartburn or indigestion.   Past Medical History  Diagnosis Date  . Diverticulosis 2008 LGIB  . Gout   . HTN (hypertension)   . History of septic arthritis   . History of alcohol abuse   . Anemia FeDA: GASTRIC POLYPS, B12    TCS 2008 EGD 2009, 2008-HB 11.1 MCV 83.6 CR 1.22, 2009 FERRITIN 102-22  . Hepatomegaly 2o to FATTY LIVER DZ  . Chronic knee pain   . B12 deficiency   . Hypothyroidism    Past Surgical History  Procedure Laterality Date  . Upper gastrointestinal endoscopy  APR 2009    INFLAMED HYPERPLASTIC POLYPS, CHRONIC GASTRITIS  . Colonoscopy  2008 [facility] DJ    LGIB 2o to TICS, prep good  . Replacement total knee bilateral    . Hand surgery    . Cholecystectomy    . Esophagogastroduodenoscopy  12/08/2007    [id number] gastric polyps seen in the cardia and body of the stomach/Normal esophagus without evidence of Barrett's, mass, erosion/Normal duodenal bulb and second portion of the duodenum. Benign bx.  . Colonoscopy with propofol N/A 08/14/2014    SLF: 1. Four large colon polyps removed. 2. The left colon is redundant 3. Moderate diverticulosis throughout teh entire examined colon  . Esophagogastroduodenoscopy (egd) with propofol N/A 08/14/2014    SLF: 1. Heme postive stools due to gastric and colon polyps 2. Multiple gastric   . Polypectomy  08/14/2014    Procedure: POLYPECTOMY;  Surgeon: Danie Binder, MD;  Location: [facility];  Service: Endoscopy;;  . Esophageal biopsy  08/14/2014    Procedure: BIOPSY;  Surgeon: Danie Binder, MD;  Location: [facility];  Service: Endoscopy;;   Allergies  Allergen Reactions  . Aspirin     REACTION: Diverticular  Bleed   Current Outpatient Prescriptions  Medication Sig Dispense Refill  . amLODipine (NORVASC) 10 MG tablet Take 10 mg by mouth daily.    . cyanocobalamin (,VITAMIN B-12,) 1000 MCG/ML injection Inject 1,000 mcg into the muscle every 30 (thirty) days.      . febuxostat (ULORIC) 40 MG tablet Take 40 mg by mouth daily.     Marland Kitchen HYDROcodone-acetaminophen (NORCO) 5-325 MG per tablet Take 1 tablet by mouth every 4 (four) hours as needed.    Marland Kitchen levothyroxine (SYNTHROID, LEVOTHROID) 175 MCG tablet Take 175 mcg by mouth daily before breakfast.     . metoprolol succinate (TOPROL-XL) 25 MG 24 hr tablet Take 25 mg by mouth every morning.     Marland Kitchen omeprazole (PRILOSEC) 20 MG capsule Take 20 mg by mouth daily.      . tamsulosin (FLOMAX) 0.4 MG CAPS capsule Take 0.4 mg by mouth daily.    .        Review of Systems     Objective:   Physical Exam  Constitutional: He is oriented to person, place, and time. He appears well-developed and well-nourished. No distress.  HENT:  Head: Normocephalic and atraumatic.  Mouth/Throat: Oropharynx is clear and moist. No oropharyngeal exudate.  Eyes: Pupils are equal, round, and reactive to light. No scleral icterus.  Neck: Normal range of motion. Neck supple.  Cardiovascular: Normal rate, regular rhythm and normal heart sounds.  Pulmonary/Chest: Effort normal and breath sounds normal. No respiratory distress.  Abdominal: Soft. Bowel sounds are normal. He exhibits no distension. There is no tenderness.  Musculoskeletal: He exhibits no edema.  Lymphadenopathy:    He has no cervical adenopathy.  Neurological: He is alert and oriented to person, place, and time.  NO  NEW FOCAL DEFICITS  Psychiatric: He has a normal mood and affect.  Vitals reviewed.         Assessment & Plan:

## 2014-10-17 NOTE — Assessment & Plan Note (Addendum)
IN PT WHO HAD INADEQUATE PREP ON RIGHT COLON.  FULL LIQUID DIET MAR 7. TCS MAR 8-SUPREP W/ MAC. DISCUSSED PROCEDURE, BENEFITS, & RISKS: < 1% chance of medication reaction, bleeding, perforation, or rupture of spleen/liver.  FOLLOW UP IN 4 MOS.

## 2014-10-17 NOTE — Progress Notes (Signed)
ON RECALL LIST  °

## 2014-10-17 NOTE — Patient Instructions (Signed)
I TRIED TO SPEAK WITH [name] BUT HE IS OUT OF THE OFFICE UNTIL NEXT WED.  FULL LIQUID DIET ON MAR 7. SEE INFO BELOW.  COLONOSCOPY MAR 8.  FOLLOW UP IN 4 MOS.   Full Liquid Diet A high-calorie, high-protein supplement should be used to meet your nutritional requirements when the full liquid diet is continued for more than 2 or 3 days. If this diet is to be used for an extended period of time (more than 7 days), a multivitamin should be considered.  Breads and Starches  Allowed: None are allowed except crackers WHOLE OR pureed (made into a thick, smooth soup) in soup.   Avoid: Any others.    Potatoes/Pasta/Rice  Allowed: ANY ITEM AS A SOUP OR SMALL PLATE OF MASHED POTATOES OR RICE.       Vegetables  Allowed: Strained tomato or vegetable juice. Vegetables pureed in soup.   Avoid: Any others.    Fruit  Allowed: Any strained fruit juices and fruit drinks. Include 1 serving of citrus or vitamin C-enriched fruit juice daily.   Avoid: Any others.  Meat and Meat Substitutes  Allowed: Egg  Avoid: Any meat, fish, or fowl. All cheese.  Milk  Allowed: Milk beverages, including milk shakes and instant breakfast mixes. Smooth yogurt.   Avoid: Any others. Avoid dairy products if not tolerated.    Soups and Combination Foods  Allowed: Broth, strained cream soups. Strained, broth-based soups.   Avoid: Any others.    Desserts and Sweets  Allowed: flavored gelatin, tapioca, plain ice cream, sherbet, smooth pudding, junket, fruit ices, frozen ice pops, pudding pops,, frozen fudge pops, chocolate syrup. Sugar, honey, jelly, syrup.   Avoid: Any others.  Fats and Oils  Allowed: Margarine, butter, cream, sour cream, oils.   Avoid: Any others.  Beverages  Allowed: All.   Avoid: None.  Condiments  Allowed: Iodized salt, pepper, spices, flavorings. Cocoa powder.   Avoid: Any others.    SAMPLE MEAL PLAN Breakfast   cup orange juice.   1 cup cooked wheat  cereal.   1 cup  milk.   1 cup beverage (coffee or tea).   Cream or sugar, if desired.    Midmorning Snack  2 SCRAMBLED OR HARD BOILED EGG   Lunch  1 cup cream soup.    cup fruit juice.   1 cup milk.    cup custard.   1 cup beverage (coffee or tea).   Cream or sugar, if desired.    Midafternoon Snack  1 cup milk shake.  Dinner  1 cup cream soup.    cup fruit juice.   1 cup milk.    cup pudding.   1 cup beverage (coffee or tea).   Cream or sugar, if desired.  Evening Snack  1 cup supplement.  To increase calories, add sugar, cream, butter, or margarine if possible. Nutritional supplements will also increase the total calories.

## 2014-10-20 NOTE — Progress Notes (Signed)
CC'ED TO PCP 

## 2014-10-24 ENCOUNTER — Ambulatory Visit (HOSPITAL_COMMUNITY): Payer: Medicare Other | Admitting: Hematology & Oncology

## 2014-10-26 ENCOUNTER — Encounter (HOSPITAL_COMMUNITY): Payer: Medicaid Other | Attending: Hematology & Oncology | Admitting: Hematology & Oncology

## 2014-10-26 ENCOUNTER — Encounter (HOSPITAL_COMMUNITY): Payer: Self-pay | Admitting: Hematology & Oncology

## 2014-10-26 VITALS — BP 126/75 | HR 95 | Temp 99.4°F | Resp 16 | Wt 242.7 lb

## 2014-10-26 DIAGNOSIS — C7A8 Other malignant neuroendocrine tumors: Secondary | ICD-10-CM

## 2014-10-26 DIAGNOSIS — C7A00 Malignant carcinoid tumor of unspecified site: Secondary | ICD-10-CM | POA: Insufficient documentation

## 2014-10-26 NOTE — Patient Instructions (Signed)
..  [place] at [facility] Discharge Instructions  RECOMMENDATIONS MADE BY THE CONSULTANT AND ANY TEST RESULTS WILL BE SENT TO YOUR REFERRING PHYSICIAN.  We will see you back in 39months. Make sure you keep your appointment in Dr. Oneida Alar!!!  Thank you for choosing [place] at [facility] to provide your oncology and hematology care.  To afford each patient quality time with our provider, please arrive at least 15 minutes before your scheduled appointment time.    You need to re-schedule your appointment should you arrive 10 or more minutes late.  We strive to give you quality time with our providers, and arriving late affects you and other patients whose appointments are after yours.  Also, if you no show three or more times for appointments you may be dismissed from the clinic at the providers discretion.     Again, thank you for choosing [facility].  Our hope is that these requests will decrease the amount of time that you wait before being seen by our physicians.       _____________________________________________________________  Should you have questions after your visit to [facility], please contact our office at [phone] between the hours of 8:30 a.m. and 4:30 p.m.  Voicemails left after 4:30 p.m. will not be returned until the following business day.  For prescription refill requests, have your pharmacy contact our office.

## 2014-10-26 NOTE — Progress Notes (Signed)
[facility] CONSULT NOTE  Patient Care Team: [name], MD as PCP - General (Family Medicine) Danie Binder, MD (Gastroenterology)  CHIEF COMPLAINTS/PURPOSE OF CONSULTATION:   EGD with biopsy showing low grade neuroendocrine tumor Fatty liver ETOH abuse Elevated IgA with normal SPEP Octreotide scan on 09/17/2014 is negative for octreotide avid metastases   HISTORY OF PRESENTING ILLNESS:  [name] 64 y.o. male is here because of a carcinoid tumor found on EGD. He denies any major problems. He is active, walks daily.  He has a good appetite.   She was presented at tumor board and an anatomic EGD with biopsy was recommended. He follows with Dr. Oneida Alar. Octreotide was not recommended.  MEDICAL HISTORY:  Past Medical History  Diagnosis Date  . Diverticulosis 2008 LGIB  . Gout   . HTN (hypertension)   . History of septic arthritis   . History of alcohol abuse   . Anemia FeDA: GASTRIC POLYPS, B12    TCS 2008 EGD 2009, 2008-HB 11.1 MCV 83.6 CR 1.22, 2009 FERRITIN 102-22  . Hepatomegaly 2o to FATTY LIVER DZ  . Chronic knee pain   . B12 deficiency   . Hypothyroidism   . DIVERTICULOSIS, COLON 08/15/2006    Qualifier: Diagnosis of  By: Jonna Munro MD, Roderic Scarce    . PUD 11/30/2008    Qualifier: History of  By: Jonna Munro MD, Roderic Scarce      SURGICAL HISTORY: Past Surgical History  Procedure Laterality Date  . Upper gastrointestinal endoscopy  APR 2009    INFLAMED HYPERPLASTIC POLYPS, CHRONIC GASTRITIS  . Colonoscopy  2008 [facility] DJ    LGIB 2o to TICS, prep good  . Replacement total knee bilateral    . Hand surgery    . Cholecystectomy    . Esophagogastroduodenoscopy  12/08/2007    [id number] gastric polyps seen in the cardia and body of the stomach/Normal esophagus without evidence of Barrett's, mass, erosion/Normal duodenal bulb and second portion of the duodenum. Benign bx.  . Colonoscopy with propofol N/A 08/14/2014    SLF: 1. Four large colon polyps removed. 2.  The left colon is redundant 3. Moderate diverticulosis throughout teh entire examined colon  . Esophagogastroduodenoscopy (egd) with propofol N/A 08/14/2014    SLF: 1. Heme postive stools due to gastric and colon polyps 2. Multiple gastric   . Polypectomy  08/14/2014    Procedure: POLYPECTOMY;  Surgeon: Danie Binder, MD;  Location: [facility];  Service: Endoscopy;;  . Esophageal biopsy  08/14/2014    Procedure: BIOPSY;  Surgeon: Danie Binder, MD;  Location: [facility];  Service: Endoscopy;;    SOCIAL HISTORY: History   Social History  . Marital Status: Single    Spouse Name: N/A  . Number of Children: 0  . Years of Education: N/A   Occupational History  . retired    Social History Main Topics  . Smoking status: Former Smoker -- 0 years  . Smokeless tobacco: Never Used     Comment: Quit x 10 years/ never smoked on regular basis  . Alcohol Use: Yes     Comment: drinks on weekends, gin/vodka 1/5th. heavier in remote past.  . Drug Use: No  . Sexual Activity: No   Other Topics Concern  . Not on file   Social History Narrative   HE DOES NOT HAVE ANY CHILDREN.  Born in [place]. Used to grow tobacco. Also worked in Architect.  FAMILY HISTORY: Family History  Problem Relation Age of Onset  . Colon  polyps Neg Hx   . Colon cancer Neg Hx   . Pancreatic disease Neg Hx   . Hypertension Sister   . Hypertension Brother   . Diabetes Brother    has no family status information on file.   Mother died at 66 Father died at 63 4 living siblings. One brother died from complications of diabetes   ALLERGIES:  is allergic to aspirin.  MEDICATIONS:  Current Outpatient Prescriptions  Medication Sig Dispense Refill  . amLODipine (NORVASC) 10 MG tablet Take 10 mg by mouth daily.    . cyanocobalamin (,VITAMIN B-12,) 1000 MCG/ML injection Inject 1,000 mcg into the muscle every 30 (thirty) days.      . febuxostat (ULORIC) 40 MG tablet Take 40 mg by mouth daily.     Marland Kitchen  HYDROcodone-acetaminophen (NORCO) 5-325 MG per tablet Take 1 tablet by mouth every 4 (four) hours as needed. 180 tablet 0  . levothyroxine (SYNTHROID, LEVOTHROID) 175 MCG tablet Take 175 mcg by mouth daily before breakfast.     . metoprolol succinate (TOPROL-XL) 25 MG 24 hr tablet Take 25 mg by mouth every morning.     Marland Kitchen omeprazole (PRILOSEC) 20 MG capsule Take 20 mg by mouth daily.      . tamsulosin (FLOMAX) 0.4 MG CAPS capsule Take 0.4 mg by mouth daily.    . cephALEXin (KEFLEX) 500 MG capsule Take 1 capsule (500 mg total) by mouth 4 (four) times daily. (Patient not taking: Reported on 09/28/2014) 40 capsule 0   No current facility-administered medications for this visit.    Review of Systems  Constitutional: Negative for fever, chills, weight loss and malaise/fatigue.  HENT: Negative for congestion, hearing loss, nosebleeds, sore throat and tinnitus.   Eyes: Negative for blurred vision, double vision, pain and discharge.  Respiratory: Negative for cough, hemoptysis, sputum production, shortness of breath and wheezing.   Cardiovascular: Negative for chest pain, palpitations, claudication, leg swelling and PND.  Gastrointestinal: Negative for heartburn, nausea, vomiting, abdominal pain, diarrhea, constipation, blood in stool and melena.  Genitourinary: Negative for dysuria, urgency, frequency and hematuria.  Musculoskeletal: Negative for myalgias, joint pain and falls.  Skin: Negative for itching and rash.  Neurological: Negative for dizziness, tingling, tremors, sensory change, speech change, focal weakness, seizures, loss of consciousness, weakness and headaches.  Endo/Heme/Allergies: Does not bruise/bleed easily.  Psychiatric/Behavioral: Negative for depression, suicidal ideas, memory loss and substance abuse. The patient is not nervous/anxious and does not have insomnia.     PHYSICAL EXAMINATION: ECOG PERFORMANCE STATUS: 0 - Asymptomatic  Filed Vitals:   10/26/14 1055  BP: 126/75   Pulse: 95  Temp: 99.4 F (37.4 C)  Resp: 16   Filed Weights   10/26/14 1055  Weight: 242 lb 11.2 oz (110.088 kg)     Physical Exam  Constitutional: He is oriented to person, place, and time and well-developed, well-nourished, and in no distress.  HENT:  Head: Normocephalic and atraumatic.  Nose: Nose normal.  Mouth/Throat: Oropharynx is clear and moist. No oropharyngeal exudate.  Eyes: Conjunctivae and EOM are normal. Pupils are equal, round, and reactive to light. Right eye exhibits no discharge. Left eye exhibits no discharge. No scleral icterus.  Neck: Normal range of motion. Neck supple. No tracheal deviation present. No thyromegaly present.  Cardiovascular: Normal rate, regular rhythm and normal heart sounds.  Exam reveals no gallop and no friction rub.   No murmur heard. Pulmonary/Chest: Effort normal and breath sounds normal. He has no wheezes. He has no rales.  Abdominal:  Soft. Bowel sounds are normal. He exhibits no distension and no mass. There is no tenderness. There is no rebound and no guarding.  Musculoskeletal: Normal range of motion. He exhibits no edema.  Lymphadenopathy:    He has no cervical adenopathy.  Neurological: He is alert and oriented to person, place, and time. He has normal reflexes. No cranial nerve deficit. Gait normal. Coordination normal.  Skin: Skin is warm and dry. No rash noted.  Psychiatric: Mood, memory, affect and judgment normal.  Nursing note and vitals reviewed.    LABORATORY DATA:  I have reviewed the data as listed Lab Results  Component Value Date   WBC 6.9 07/04/2014   HGB 14.3 09/11/2014   HCT 42.0 09/11/2014   MCV 90.1 07/04/2014   PLT 151 07/04/2014     Chemistry      Component Value Date/Time   NA 142 09/11/2014 1327   K 3.9 09/11/2014 1327   CL 106 09/11/2014 1327   CO2 27 08/09/2014 1105   BUN 17 09/11/2014 1327   CREATININE 1.20 09/11/2014 1327   CREATININE 1.30 02/24/2012 1023      Component Value Date/Time    CALCIUM 9.1 08/09/2014 1105   ALKPHOS 60 09/08/2012 1048   AST 93* 09/08/2012 1048   ALT 42 09/08/2012 1048   BILITOT 1.3* 09/08/2012 1048      Diagnosis 1. Colon, polyp(s), cecal - TUBULAR ADENOMA. NO HIGH GRADE DYSPLASIA OR MALIGNANCY IDENTIFIED. 2. Colon, polyp(s), descending - TUBULOVILLOUS ADENOMA. NO HIGH GRADE DYSPLASIA OR MALIGNANCY IDENTIFIED. 3. Stomach, polyp(s) - LOW GRADE NEUROENDOCRINE TUMOR. - INTESTINAL METAPLASIA AND INFLAMMATION CONSISTENT WITH CHRONIC ATROPHIC GASTRITIS. - SEE MICROSCOPIC DESCRIPTION. 4. Stomach, biopsy - FOCAL INVOLVEMENT BY LOW GRADE NEUROENDOCRINE TUMOR. - CHRONIC GASTRITIS. - SEE MICROSCOPIC DESCRIPTION. Microscopic Comment 3. Within the stroma there are sheets and nests of epithelial cells with round to oval, relatively uniform nuclei and small nucleoli and the morphologic features are consistent with low grade neuroendocrine tumor (carcinoid tumor). Immunohistochemistry is attempted and the tumor is not sufficiently present in the immunohistochemical slides. There is also chronic inflammation with intestinal metaplasia consistent with chronic atrophic gastritis. 4. There is chronic inflammation consistent with chronic gastritis. There is also focal involvement by similar appearing low grade neuroendocrine tumor (carcinoid tumor). Case discussed with Dr. Oneida Alar on 08/17/14. (JDP:gt, 08/15/14) Claudette Laws MD Pathologist, Electronic Signature (Case signed 08/17/2014)  ASSESSMENT & PLAN:   Carcinoid, gastric  Carcinoid tumor was found in a gastric polyp and on a random biopsy. The patient follows up with Dr. Oneida Alar. I discussed with him  the tumor board recommendations. He will have ongoing surveillance EGDs. I have also recommended he follow with [place] on a regular basis with ongoing laboratory studies. Octreotide scan did not suggest evidence of diffuse involvement of his gastric mucosa.   I will see him back in 3 months with repeat  laboratory studies, sooner if needed.   All questions were answered. The patient knows to call the clinic with any problems, questions or concerns.    Molli Hazard, MD MD 10/26/2014 12:49 PM

## 2014-10-29 ENCOUNTER — Telehealth: Payer: Self-pay | Admitting: Orthopedic Surgery

## 2014-10-29 ENCOUNTER — Other Ambulatory Visit: Payer: Self-pay | Admitting: *Deleted

## 2014-10-29 DIAGNOSIS — G8929 Other chronic pain: Secondary | ICD-10-CM

## 2014-10-29 MED ORDER — HYDROCODONE-ACETAMINOPHEN 5-325 MG PO TABS: 1.0000 | ORAL_TABLET | ORAL | Status: DC | PRN

## 2014-10-29 NOTE — Telephone Encounter (Signed)
Prescription available, patient aware  

## 2014-10-29 NOTE — Telephone Encounter (Signed)
Patient called for refill of medication: HYDROcodone-acetaminophen (NORCO) 5-325 MG per tablet [id number] - phone# [phone]

## 2014-10-30 NOTE — Telephone Encounter (Signed)
Patient picked up prescription.

## 2014-10-31 NOTE — Patient Instructions (Signed)
[name]  10/31/2014   Your procedure is scheduled on:  3/8/216  Report to [facility] at  700  AM.  Call this number if you have problems the morning of surgery: [phone]   Remember:   Do not eat food or drink liquids after midnight.   Take these medicines the morning of surgery with A SIP OF WATER:  Amlodipine, uloric, hydrocodone, levothyroxine, metoprolol, prilosec, flomax   Do not wear jewelry, make-up or nail polish.  Do not wear lotions, powders, or perfumes.   Do not shave 48 hours prior to surgery. Men may shave face and neck.  Do not bring valuables to the hospital.  [facility] is not responsible for any belongings or valuables.               Contacts, dentures or bridgework may not be worn into surgery.  Leave suitcase in the car. After surgery it may be brought to your room.  For patients admitted to the hospital, discharge time is determined by your treatment team.               Patients discharged the day of surgery will not be allowed to drive home.  Name and phone number of your driver: family  Special Instructions: N/A   Please read over the following fact sheets that you were given: Pain Booklet, Coughing and Deep Breathing, Surgical Site Infection Prevention, Anesthesia Post-op Instructions and Care and Recovery After Surgery Colonoscopy A colonoscopy is an exam to look at the entire large intestine (colon). This exam can help find problems such as tumors, polyps, inflammation, and areas of bleeding. The exam takes about 1 hour.  LET [facility] CARE PROVIDER KNOW ABOUT:   Any allergies you have.  All medicines you are taking, including vitamins, herbs, eye drops, creams, and over-the-counter medicines.  Previous problems you or members of your family have had with the use of anesthetics.  Any blood disorders you have.  Previous surgeries you have had.  Medical conditions you have. RISKS AND COMPLICATIONS  Generally, this is a safe procedure.  However, as with any procedure, complications can occur. Possible complications include:  Bleeding.  Tearing or rupture of the colon wall.  Reaction to medicines given during the exam.  Infection (rare). BEFORE THE PROCEDURE   Ask your health care provider about changing or stopping your regular medicines.  You may be prescribed an oral bowel prep. This involves drinking a large amount of medicated liquid, starting the day before your procedure. The liquid will cause you to have multiple loose stools until your stool is almost clear or light green. This cleans out your colon in preparation for the procedure.  Do not eat or drink anything else once you have started the bowel prep, unless your health care provider tells you it is safe to do so.  Arrange for someone to drive you home after the procedure. PROCEDURE   You will be given medicine to help you relax (sedative).  You will lie on your side with your knees bent.  A long, flexible tube with a light and camera on the end (colonoscope) will be inserted through the rectum and into the colon. The camera sends video back to a computer screen as it moves through the colon. The colonoscope also releases carbon dioxide gas to inflate the colon. This helps your health care provider see the area better.  During the exam, your health care provider may take a small  tissue sample (biopsy) to be examined under a microscope if any abnormalities are found.  The exam is finished when the entire colon has been viewed. AFTER THE PROCEDURE   Do not drive for 24 hours after the exam.  You may have a small amount of blood in your stool.  You may pass moderate amounts of gas and have mild abdominal cramping or bloating. This is caused by the gas used to inflate your colon during the exam.  Ask when your test results will be ready and how you will get your results. Make sure you get your test results. Document Released: 08/14/2000 Document Revised:  06/07/2013 Document Reviewed: 04/24/2013 [facility] Patient Information 2015 [place], [place]. This information is not intended to replace advice given to you by your health care provider. Make sure you discuss any questions you have with your health care provider. PATIENT INSTRUCTIONS POST-ANESTHESIA  IMMEDIATELY FOLLOWING SURGERY:  Do not drive or operate machinery for the first twenty four hours after surgery.  Do not make any important decisions for twenty four hours after surgery or while taking narcotic pain medications or sedatives.  If you develop intractable nausea and vomiting or a severe headache please notify your doctor immediately.  FOLLOW-UP:  Please make an appointment with your surgeon as instructed. You do not need to follow up with anesthesia unless specifically instructed to do so.  WOUND CARE INSTRUCTIONS (if applicable):  Keep a dry clean dressing on the anesthesia/puncture wound site if there is drainage.  Once the wound has quit draining you may leave it open to air.  Generally you should leave the bandage intact for twenty four hours unless there is drainage.  If the epidural site drains for more than 36-48 hours please call the anesthesia department.  QUESTIONS?:  Please feel free to call your physician or the hospital operator if you have any questions, and they will be happy to assist you.

## 2014-11-01 ENCOUNTER — Encounter (HOSPITAL_COMMUNITY)
Admission: RE | Admit: 2014-11-01 | Discharge: 2014-11-01 | Disposition: A | Discharge status: Home / Self Care | Payer: Medicare Other | Source: Ambulatory Visit | Attending: Gastroenterology | Admitting: Gastroenterology

## 2014-11-01 ENCOUNTER — Encounter (HOSPITAL_COMMUNITY): Payer: Self-pay

## 2014-11-01 DIAGNOSIS — K635 Polyp of colon: Secondary | ICD-10-CM | POA: Diagnosis not present

## 2014-11-01 DIAGNOSIS — Z01818 Encounter for other preprocedural examination: Secondary | ICD-10-CM | POA: Insufficient documentation

## 2014-11-01 LAB — CBC
HCT: 41.1 % (ref 39.0–52.0)
Hemoglobin: 13.7 g/dL (ref 13.0–17.0)
MCH: 30.1 pg (ref 26.0–34.0)
MCHC: 33.3 g/dL (ref 30.0–36.0)
MCV: 90.3 fL (ref 78.0–100.0)
PLATELETS: 175 10*3/uL (ref 150–400)
RBC: 4.55 MIL/uL (ref 4.22–5.81)
RDW: 13.6 % (ref 11.5–15.5)
WBC: 6 10*3/uL (ref 4.0–10.5)

## 2014-11-01 LAB — BASIC METABOLIC PANEL
ANION GAP: 7 (ref 5–15)
BUN: 16 mg/dL (ref 6–23)
CALCIUM: 9.3 mg/dL (ref 8.4–10.5)
CO2: 27 mmol/L (ref 19–32)
Chloride: 105 mmol/L (ref 96–112)
Creatinine, Ser: 1.23 mg/dL (ref 0.50–1.35)
GFR calc Af Amer: 69 mL/min — ABNORMAL LOW (ref >90)
GFR calc non Af Amer: 60 mL/min — ABNORMAL LOW (ref >90)
Glucose, Bld: 104 mg/dL — ABNORMAL HIGH (ref 70–99)
Potassium: 3.9 mmol/L (ref 3.5–5.1)
Sodium: 139 mmol/L (ref 135–145)

## 2014-11-06 ENCOUNTER — Ambulatory Visit (HOSPITAL_COMMUNITY): Payer: Medicare Other | Admitting: Anesthesiology

## 2014-11-06 ENCOUNTER — Encounter (HOSPITAL_COMMUNITY): Payer: Self-pay | Admitting: Anesthesiology

## 2014-11-06 ENCOUNTER — Encounter (HOSPITAL_COMMUNITY)
Admission: RE | Disposition: A | Discharge status: Home / Self Care | Payer: Self-pay | Source: Ambulatory Visit | Attending: Gastroenterology

## 2014-11-06 ENCOUNTER — Ambulatory Visit (HOSPITAL_COMMUNITY)
Admission: RE | Admit: 2014-11-06 | Discharge: 2014-11-06 | Disposition: A | Discharge status: Home / Self Care | Payer: Medicare Other | Source: Ambulatory Visit | Attending: Gastroenterology | Admitting: Gastroenterology

## 2014-11-06 DIAGNOSIS — M109 Gout, unspecified: Secondary | ICD-10-CM | POA: Diagnosis not present

## 2014-11-06 DIAGNOSIS — Z86010 Personal history of colonic polyps: Secondary | ICD-10-CM

## 2014-11-06 DIAGNOSIS — E039 Hypothyroidism, unspecified: Secondary | ICD-10-CM | POA: Insufficient documentation

## 2014-11-06 DIAGNOSIS — Z87891 Personal history of nicotine dependence: Secondary | ICD-10-CM | POA: Insufficient documentation

## 2014-11-06 DIAGNOSIS — D125 Benign neoplasm of sigmoid colon: Secondary | ICD-10-CM | POA: Diagnosis not present

## 2014-11-06 DIAGNOSIS — D123 Benign neoplasm of transverse colon: Secondary | ICD-10-CM | POA: Insufficient documentation

## 2014-11-06 DIAGNOSIS — M009 Pyogenic arthritis, unspecified: Secondary | ICD-10-CM | POA: Diagnosis not present

## 2014-11-06 DIAGNOSIS — Z9889 Other specified postprocedural states: Secondary | ICD-10-CM | POA: Diagnosis not present

## 2014-11-06 DIAGNOSIS — K5730 Diverticulosis of large intestine without perforation or abscess without bleeding: Secondary | ICD-10-CM | POA: Diagnosis not present

## 2014-11-06 DIAGNOSIS — K648 Other hemorrhoids: Secondary | ICD-10-CM | POA: Insufficient documentation

## 2014-11-06 DIAGNOSIS — I10 Essential (primary) hypertension: Secondary | ICD-10-CM | POA: Diagnosis not present

## 2014-11-06 DIAGNOSIS — D124 Benign neoplasm of descending colon: Secondary | ICD-10-CM | POA: Insufficient documentation

## 2014-11-06 DIAGNOSIS — E538 Deficiency of other specified B group vitamins: Secondary | ICD-10-CM | POA: Insufficient documentation

## 2014-11-06 DIAGNOSIS — K219 Gastro-esophageal reflux disease without esophagitis: Secondary | ICD-10-CM | POA: Diagnosis not present

## 2014-11-06 DIAGNOSIS — G8929 Other chronic pain: Secondary | ICD-10-CM | POA: Diagnosis not present

## 2014-11-06 DIAGNOSIS — Z79899 Other long term (current) drug therapy: Secondary | ICD-10-CM | POA: Diagnosis not present

## 2014-11-06 DIAGNOSIS — Z862 Personal history of diseases of the blood and blood-forming organs and certain disorders involving the immune mechanism: Secondary | ICD-10-CM | POA: Diagnosis not present

## 2014-11-06 DIAGNOSIS — M25569 Pain in unspecified knee: Secondary | ICD-10-CM | POA: Insufficient documentation

## 2014-11-06 DIAGNOSIS — D122 Benign neoplasm of ascending colon: Secondary | ICD-10-CM | POA: Insufficient documentation

## 2014-11-06 HISTORY — PX: COLONOSCOPY WITH PROPOFOL: SHX5780

## 2014-11-06 HISTORY — PX: POLYPECTOMY: SHX5525

## 2014-11-06 SURGERY — COLONOSCOPY WITH PROPOFOL
Anesthesia: Monitor Anesthesia Care

## 2014-11-06 MED ORDER — EPINEPHRINE HCL 1 MG/ML IJ SOLN
INTRAMUSCULAR | Status: DC | PRN
  Administered 2014-11-06: 1 mg via SUBCUTANEOUS

## 2014-11-06 MED ORDER — PROPOFOL INFUSION 10 MG/ML OPTIME
INTRAVENOUS | Status: DC | PRN
  Administered 2014-11-06: 125 ug/kg/min via INTRAVENOUS
  Administered 2014-11-06: 09:00:00 via INTRAVENOUS

## 2014-11-06 MED ORDER — SIMETHICONE 40 MG/0.6ML PO SUSP
ORAL | Status: DC | PRN
  Administered 2014-11-06: 1000 mL

## 2014-11-06 MED ORDER — PHENYLEPHRINE HCL 10 MG/ML IJ SOLN
INTRAMUSCULAR | Status: DC | PRN
  Administered 2014-11-06: 75 ug via INTRAVENOUS
  Administered 2014-11-06 (×2): 50 ug via INTRAVENOUS

## 2014-11-06 MED ORDER — EPINEPHRINE HCL 0.1 MG/ML IJ SOSY
PREFILLED_SYRINGE | INTRAMUSCULAR | Status: AC
  Filled 2014-11-06: qty 10

## 2014-11-06 MED ORDER — FENTANYL CITRATE 0.05 MG/ML IJ SOLN: 25.0000 ug | INTRAMUSCULAR | Status: DC | PRN

## 2014-11-06 MED ORDER — PROPOFOL 10 MG/ML IV BOLUS
INTRAVENOUS | Status: AC
  Filled 2014-11-06: qty 20

## 2014-11-06 MED ORDER — FENTANYL CITRATE 0.05 MG/ML IJ SOLN
25.0000 ug | INTRAMUSCULAR | Status: AC
  Administered 2014-11-06 (×2): 25 ug via INTRAVENOUS

## 2014-11-06 MED ORDER — MIDAZOLAM HCL 2 MG/2ML IJ SOLN
1.0000 mg | INTRAMUSCULAR | Status: DC | PRN
  Administered 2014-11-06: 2 mg via INTRAVENOUS

## 2014-11-06 MED ORDER — LACTATED RINGERS IV SOLN
INTRAVENOUS | Status: DC
  Administered 2014-11-06: 08:00:00 via INTRAVENOUS

## 2014-11-06 MED ORDER — GLYCOPYRROLATE 0.2 MG/ML IJ SOLN
0.2000 mg | Freq: Once | INTRAMUSCULAR | Status: AC
  Administered 2014-11-06: 0.2 mg via INTRAVENOUS

## 2014-11-06 MED ORDER — ONDANSETRON HCL 4 MG/2ML IJ SOLN: 4.0000 mg | Freq: Once | INTRAMUSCULAR | Status: DC | PRN

## 2014-11-06 MED ORDER — GLYCOPYRROLATE 0.2 MG/ML IJ SOLN
INTRAMUSCULAR | Status: AC
  Filled 2014-11-06: qty 1

## 2014-11-06 MED ORDER — FENTANYL CITRATE 0.05 MG/ML IJ SOLN
INTRAMUSCULAR | Status: AC
  Filled 2014-11-06: qty 2

## 2014-11-06 MED ORDER — MIDAZOLAM HCL 2 MG/2ML IJ SOLN
INTRAMUSCULAR | Status: AC
  Filled 2014-11-06: qty 2

## 2014-11-06 SURGICAL SUPPLY — 29 items
DEVICE CLIP HEMOSTAT 235CM (CLIP) ×4 IMPLANT
ELECT REM PT RETURN 9FT ADLT (ELECTROSURGICAL) ×3
ELECTRODE REM PT RTRN 9FT ADLT (ELECTROSURGICAL) IMPLANT
FCP BXJMBJMB 240X2.8X (CUTTING FORCEPS)
FLOOR PAD 36X40 (MISCELLANEOUS) ×3
FORCEPS BIOP RAD 4 LRG CAP 4 (CUTTING FORCEPS) ×2 IMPLANT
FORCEPS BIOP RJ4 240 W/NDL (CUTTING FORCEPS)
FORCEPS BXJMBJMB 240X2.8X (CUTTING FORCEPS) IMPLANT
FORMALIN 10 PREFIL 20ML (MISCELLANEOUS) ×6 IMPLANT
INJECTOR/SNARE I SNARE (MISCELLANEOUS) IMPLANT
KIT CLEAN ENDO COMPLIANCE (KITS) ×3 IMPLANT
LUBRICANT JELLY 4.5OZ STERILE (MISCELLANEOUS) ×2 IMPLANT
MANIFOLD NEPTUNE II (INSTRUMENTS) ×2 IMPLANT
MARKER SPOT ENDO TATTOO 5ML (MISCELLANEOUS) IMPLANT
NDL SCLEROTHERAPY 25GX240 (NEEDLE) IMPLANT
NEEDLE SCLEROTHERAPY 25GX240 (NEEDLE) ×3 IMPLANT
OVERTUBE ENDOCUFF GREEN (MISCELLANEOUS) ×2 IMPLANT
PAD FLOOR 36X40 (MISCELLANEOUS) ×1 IMPLANT
PROBE APC STR FIRE (PROBE) IMPLANT
PROBE INJECTION GOLD (MISCELLANEOUS)
PROBE INJECTION GOLD 7FR (MISCELLANEOUS) IMPLANT
ROTH PLATINUM NET UNIVERSAL (MISCELLANEOUS) ×2 IMPLANT
SNARE ROTATE MED OVAL 20MM (MISCELLANEOUS) ×2 IMPLANT
SNARE SHORT THROW 13M SML OVAL (MISCELLANEOUS) ×1 IMPLANT
SPOT EX ENDOSCOPIC TATTOO (MISCELLANEOUS) ×2
SYR 50ML LL SCALE MARK (SYRINGE) ×2 IMPLANT
TRAP SPECIMEN MUCOUS 40CC (MISCELLANEOUS) ×2 IMPLANT
TUBING IRRIGATION ENDOGATOR (MISCELLANEOUS) ×2 IMPLANT
WATER STERILE IRR 1000ML POUR (IV SOLUTION) ×2 IMPLANT

## 2014-11-06 NOTE — Op Note (Signed)
[facility] [address] [place], [address]   COLONOSCOPY PROCEDURE REPORT  PATIENT: Philip, King  MR#: [id number] BIRTHDATE: 1951/04/29 , 63  yrs. old GENDER: male ENDOSCOPIST: Danie Binder, MD REFERRED BY: PROCEDURE DATE:  December 01, 2014 PROCEDURE:   Colonoscopy with cold biopsyAND with snare polypectomy, and Submucosal injection: SPOT/EPINEPHRINE INDICATIONS:high risk patient with personal history of colonic polyps.  INCOMPLETE PREP R COLON DEC 2015. MEDICATIONS: Monitored anesthesia care  DESCRIPTION OF PROCEDURE:    Physical exam was performed.  Informed consent was obtained from the patient after explaining the benefits, risks, and alternatives to procedure.  The patient was connected to monitor and placed in left lateral position. Continuous oxygen was provided by nasal cannula and IV medicine administered through an indwelling cannula.  After administration of sedation and rectal exam, the patients rectum was intubated and the     colonoscope was advanced under direct visualization to the cecum.  The scope was removed slowly by carefully examining the color, texture, anatomy, and integrity mucosa on the way out.  The patient was recovered in endoscopy and discharged home in satisfactory condition.    COLON FINDINGS: Two pedunculated polyps measuring 2 cm in size were found.  A polypectomy was performed using snare cautery.  , Four polyps ranging from 2 to 43mm in size were found in the sigmoid colon, descending colon, and transverse colon.  A polypectomy was performed with cold forceps.  , Three sessile polyps ranging from 6 to 68mm in size were found in the transverse colon and descending colon.  A polypectomy was performed using snare cautery.  , There was moderate diverticulosis noted in the sigmoid colon and descending colon.  , and Moderate sized internal hemorrhoids were found.  PREP QUALITY: good. CECAL W/D TIME: 30        minutes COMPLICATIONS: None  ENDOSCOPIC IMPRESSION: 1.   EIGHT SMALLL POLYPS REMOVED. ONE LARGE pedunculated polyp REMOVED FROM ASCENDING COLON. 2.   Moderate diverticulosis in the sigmoid colon and descending colon 3.   Moderate sized internal hemorrhoids  RECOMMENDATIONS: NO MRI FOR 30 DAYS DUE TO METAL CLIP PLACEMENT IN THE COLON. FOLLOW A HIGH FIBER DIET.  AVOID ITEMS THAT CAUSE BLOATING. AWAIT BIOPSY Next colonoscopy in 1-3 years.  ALL SISTERS, BROTHERS, CHILDREN, AND PARENTS NEED TO HAVE A COLONOSCOPY STARTING AT THE AGE OF 40.    _______________________________ eSignedDanie Binder, MD 12-01-2014 3:57 PM    CPT CODES: ICD CODES:  The ICD and CPT codes recommended by this software are interpretations from the data that the clinical staff has captured with the software.  The verification of the translation of this report to the ICD and CPT codes and modifiers is the sole responsibility of the health care institution and practicing physician where this report was generated.  [employer] will not be held responsible for the validity of the ICD and CPT codes included on this report.  AMA assumes no liability for data contained or not contained herein. CPT is a Designer, television/film set of the [employer].

## 2014-11-06 NOTE — Discharge Instructions (Signed)
You had 9 polyps removed. ONE WAS A LARGE POLYP.  I PUT 2 CLIPS IN YOUR COLON TO PREVENT THE LARGE POLYP SITE FROM BLEEDING. You have MODERATE internal hemorrhoids and diverticulosis IN YOUR RIGHT AND LEFT COLON.   NO MRI FOR 30 DAYS DUE TO METAL CLIP PLACEMENT IN THE COLON.   FOLLOW A HIGH FIBER DIET. AVOID ITEMS THAT CAUSE BLOATING. SEE INFO BELOW.  YOUR BIOPSY RESULTS WILL BE AVAILABLE IN MY CHART AFTER MAR 10  OR MY OFFICE WILL CONTACT YOU IN 10-14 DAYS WITH YOUR RESULTS.   Next colonoscopy in 1-3 years. YOUR SISTERS, BROTHERS, CHILDREN, AND PARENTS NEED TO HAVE A COLONOSCOPY STARTING AT THE AGE OF 40.     Colonoscopy Care After Read the instructions outlined below and refer to this sheet in the next week. These discharge instructions provide you with general information on caring for yourself after you leave the hospital. While your treatment has been planned according to the most current medical practices available, unavoidable complications occasionally occur. If you have any problems or questions after discharge, call DR. Burr Soffer, [phone].  ACTIVITY  You may resume your regular activity, but move at a slower pace for the next 24 hours.   Take frequent rest periods for the next 24 hours.   Walking will help get rid of the air and reduce the bloated feeling in your belly (abdomen).   No driving for 24 hours (because of the medicine (anesthesia) used during the test).   You may shower.   Do not sign any important legal documents or operate any machinery for 24 hours (because of the anesthesia used during the test).    NUTRITION  Drink plenty of fluids.   You may resume your normal diet as instructed by your doctor.   Begin with a light meal and progress to your normal diet. Heavy or fried foods are harder to digest and may make you feel sick to your stomach (nauseated).   Avoid alcoholic beverages for 24 hours or as instructed.    MEDICATIONS  You may resume  your normal medications.   WHAT YOU CAN EXPECT TODAY  Some feelings of bloating in the abdomen.   Passage of more gas than usual.   Spotting of blood in your stool or on the toilet paper  .  IF YOU HAD POLYPS REMOVED DURING THE COLONOSCOPY:  Eat a soft diet IF YOU HAVE NAUSEA, BLOATING, ABDOMINAL PAIN, OR VOMITING.    FINDING OUT THE RESULTS OF YOUR TEST Not all test results are available during your visit. DR. Oneida Alar WILL CALL YOU WITHIN 14 DAYS OF YOUR PROCEDUE WITH YOUR RESULTS. Do not assume everything is normal if you have not heard from DR. Makaiyah Schweiger, CALL HER OFFICE AT [phone].  SEEK IMMEDIATE MEDICAL ATTENTION AND CALL THE OFFICE: [phone] IF:  You have more than a spotting of blood in your stool.   Your belly is swollen (abdominal distention).   You are nauseated or vomiting.   You have a temperature over 101F.   You have abdominal pain or discomfort that is severe or gets worse throughout the day.  Polyps, Colon  A polyp is extra tissue that grows inside your body. Colon polyps grow in the large intestine. The large intestine, also called the colon, is part of your digestive system. It is a long, hollow tube at the end of your digestive tract where your body makes and stores stool. Most polyps are not dangerous. They are benign. This means they  are not cancerous. But over time, some types of polyps can turn into cancer. Polyps that are smaller than a pea are usually not harmful. But larger polyps could someday become or may already be cancerous. To be safe, doctors remove all polyps and test them.   WHO GETS POLYPS? Anyone can get polyps, but certain people are more likely than others. You may have a greater chance of getting polyps if:  You are over 50.   You have had polyps before.   Someone in your family has had polyps.   Someone in your family has had cancer of the large intestine.   Find out if someone in your family has had polyps. You may also be  more likely to get polyps if you:   Eat a lot of fatty foods   Smoke   Drink alcohol   Do not exercise  Eat too much   TREATMENT  The caregiver will remove the polyp during sigmoidoscopy or colonoscopy.  PREVENTION There is not one sure way to prevent polyps. You might be able to lower your risk of getting them if you:  Eat more fruits and vegetables and less fatty food.   Do not smoke.   Avoid alcohol.   Exercise every day.   Lose weight if you are overweight.   Eating more calcium and folate can also lower your risk of getting polyps. Some foods that are rich in calcium are milk, cheese, and broccoli. Some foods that are rich in folate are chickpeas, kidney beans, and spinach.

## 2014-11-06 NOTE — Anesthesia Preprocedure Evaluation (Signed)
Anesthesia Evaluation  Patient identified by MRN, date of birth, ID band Patient awake    Reviewed: Allergy & Precautions, H&P , NPO status , Patient's Chart, lab work & pertinent test results  Airway Mallampati: II  TM Distance: >3 FB     Dental  (+) Teeth Intact, Missing   Pulmonary former smoker,  breath sounds clear to auscultation        Cardiovascular hypertension, Pt. on medications Rhythm:Regular Rate:Normal     Neuro/Psych    GI/Hepatic PUD, GERD-  ,(+)     substance abuse  alcohol use,   Endo/Other  Hypothyroidism Hx pancreatitis  Renal/GU      Musculoskeletal  (+) Arthritis -,   Abdominal   Peds  Hematology  (+) anemia ,   Anesthesia Other Findings   Reproductive/Obstetrics                             Anesthesia Physical Anesthesia Plan  ASA: III  Anesthesia Plan: MAC   Post-op Pain Management:    Induction: Intravenous  Airway Management Planned: Simple Face Mask  Additional Equipment:   Intra-op Plan:   Post-operative Plan:   Informed Consent: I have reviewed the patients History and Physical, chart, labs and discussed the procedure including the risks, benefits and alternatives for the proposed anesthesia with the patient or authorized representative who has indicated his/her understanding and acceptance.     Plan Discussed with:   Anesthesia Plan Comments:         Anesthesia Quick Evaluation

## 2014-11-06 NOTE — Transfer of Care (Signed)
Immediate Anesthesia Transfer of Care Note  Patient: [name]  Procedure(s) Performed: Procedure(s) with comments: COLONOSCOPY WITH PROPOFOL (N/A) - Did not go to cecum was repeat exam POLYPECTOMY (N/A) - Transverse Colon x3, Ascending Colon x2, Descending Colon x3  Patient Location: PACU  Anesthesia Type:MAC  Level of Consciousness: awake, alert  and oriented  Airway & Oxygen Therapy: Patient Spontanous Breathing and Patient connected to nasal cannula oxygen  Post-op Assessment: Report given to RN  Post vital signs: Reviewed and stable  Last Vitals:  Filed Vitals:   11/06/14 0820  BP: 109/78  Pulse:   Temp:   Resp: 11    Complications: No apparent anesthesia complications

## 2014-11-06 NOTE — Anesthesia Postprocedure Evaluation (Signed)
  Anesthesia Post-op Note  Patient: [name]  Procedure(s) Performed: Procedure(s) with comments: COLONOSCOPY WITH PROPOFOL (N/A) - Did not go to cecum was repeat exam POLYPECTOMY (N/A) - Transverse Colon x3, Ascending Colon x2, Descending Colon x3  Patient Location: PACU  Anesthesia Type:MAC  Level of Consciousness: awake, alert  and oriented  Airway and Oxygen Therapy: Patient Spontanous Breathing  Post-op Pain: none  Post-op Assessment: Post-op Vital signs reviewed, Patient's Cardiovascular Status Stable, Respiratory Function Stable, Patent Airway and No signs of Nausea or vomiting  Post-op Vital Signs: Reviewed and stable  Last Vitals:  Filed Vitals:   11/06/14 0820  BP: 109/78  Pulse:   Temp:   Resp: 11    Complications: No apparent anesthesia complications

## 2014-11-06 NOTE — H&P (View-Only) (Signed)
Subjective:    Patient ID: [name], male    DOB: [birth year], 64 y.o.   MRN: [id number] [name], MD  HPI Feeling fine. BmS: regular.  PT DENIES FEVER, CHILLS, HEMATOCHEZIA, nausea, vomiting, melena, diarrhea, CHEST PAIN, SHORTNESS OF BREATH,  constipation, abdominal pain, problems swallowing, problems with sedation, heartburn or indigestion.   Past Medical History  Diagnosis Date  . Diverticulosis 2008 LGIB  . Gout   . HTN (hypertension)   . History of septic arthritis   . History of alcohol abuse   . Anemia FeDA: GASTRIC POLYPS, B12    TCS 2008 EGD 2009, 2008-HB 11.1 MCV 83.6 CR 1.22, 2009 FERRITIN 102-22  . Hepatomegaly 2o to FATTY LIVER DZ  . Chronic knee pain   . B12 deficiency   . Hypothyroidism    Past Surgical History  Procedure Laterality Date  . Upper gastrointestinal endoscopy  APR 2009    INFLAMED HYPERPLASTIC POLYPS, CHRONIC GASTRITIS  . Colonoscopy  2008 [facility] DJ    LGIB 2o to TICS, prep good  . Replacement total knee bilateral    . Hand surgery    . Cholecystectomy    . Esophagogastroduodenoscopy  12/08/2007    [id number] gastric polyps seen in the cardia and body of the stomach/Normal esophagus without evidence of Barrett's, mass, erosion/Normal duodenal bulb and second portion of the duodenum. Benign bx.  . Colonoscopy with propofol N/A 08/14/2014    SLF: 1. Four large colon polyps removed. 2. The left colon is redundant 3. Moderate diverticulosis throughout teh entire examined colon  . Esophagogastroduodenoscopy (egd) with propofol N/A 08/14/2014    SLF: 1. Heme postive stools due to gastric and colon polyps 2. Multiple gastric   . Polypectomy  08/14/2014    Procedure: POLYPECTOMY;  Surgeon: Danie Binder, MD;  Location: [facility];  Service: Endoscopy;;  . Esophageal biopsy  08/14/2014    Procedure: BIOPSY;  Surgeon: Danie Binder, MD;  Location: [facility];  Service: Endoscopy;;   Allergies  Allergen Reactions  . Aspirin     REACTION: Diverticular  Bleed   Current Outpatient Prescriptions  Medication Sig Dispense Refill  . amLODipine (NORVASC) 10 MG tablet Take 10 mg by mouth daily.    . cyanocobalamin (,VITAMIN B-12,) 1000 MCG/ML injection Inject 1,000 mcg into the muscle every 30 (thirty) days.      . febuxostat (ULORIC) 40 MG tablet Take 40 mg by mouth daily.     Marland Kitchen HYDROcodone-acetaminophen (NORCO) 5-325 MG per tablet Take 1 tablet by mouth every 4 (four) hours as needed.    Marland Kitchen levothyroxine (SYNTHROID, LEVOTHROID) 175 MCG tablet Take 175 mcg by mouth daily before breakfast.     . metoprolol succinate (TOPROL-XL) 25 MG 24 hr tablet Take 25 mg by mouth every morning.     Marland Kitchen omeprazole (PRILOSEC) 20 MG capsule Take 20 mg by mouth daily.      . tamsulosin (FLOMAX) 0.4 MG CAPS capsule Take 0.4 mg by mouth daily.    .        Review of Systems     Objective:   Physical Exam  Constitutional: He is oriented to person, place, and time. He appears well-developed and well-nourished. No distress.  HENT:  Head: Normocephalic and atraumatic.  Mouth/Throat: Oropharynx is clear and moist. No oropharyngeal exudate.  Eyes: Pupils are equal, round, and reactive to light. No scleral icterus.  Neck: Normal range of motion. Neck supple.  Cardiovascular: Normal rate, regular rhythm and normal heart sounds.  Pulmonary/Chest: Effort normal and breath sounds normal. No respiratory distress.  Abdominal: Soft. Bowel sounds are normal. He exhibits no distension. There is no tenderness.  Musculoskeletal: He exhibits no edema.  Lymphadenopathy:    He has no cervical adenopathy.  Neurological: He is alert and oriented to person, place, and time.  NO  NEW FOCAL DEFICITS  Psychiatric: He has a normal mood and affect.  Vitals reviewed.         Assessment & Plan:

## 2014-11-06 NOTE — Interval H&P Note (Signed)
History and Physical Interval Note:  11/06/2014 8:03 AM  [name]  has presented today for surgery, with the diagnosis of colon polyps  The various methods of treatment have been discussed with the patient and family. After consideration of risks, benefits and other options for treatment, the patient has consented to  Procedure(s) with comments: COLONOSCOPY WITH PROPOFOL (N/A) - 830 - Dr. requests time - has a meeting  as a surgical intervention .  The patient's history has been reviewed, patient examined, no change in status, stable for surgery.  I have reviewed the patient's chart and labs.  Questions were answered to the patient's satisfaction.     [employer]

## 2014-11-07 ENCOUNTER — Encounter (HOSPITAL_COMMUNITY): Payer: Self-pay | Admitting: Gastroenterology

## 2014-11-09 ENCOUNTER — Telehealth: Payer: Self-pay | Admitting: Gastroenterology

## 2014-11-09 NOTE — Telephone Encounter (Signed)
Please call pt. HE had ONE LARGE ADVANCED POLYP, AND 5 Simple adenomas removed.THE REST WERE HYPERPLASTIC.      NO MRI FOR 30 DAYS DUE TO METAL CLIP PLACEMENT IN THE COLON.  FOLLOW A HIGH FIBER DIET. AVOID ITEMS THAT CAUSE BLOATING.   FOLLOW UP IN AUG 2016 E30 TV ADENOMA/CARCINOID.  Next colonoscopy in 3 years. ALL SISTERS, BROTHERS, CHILDREN, AND PARENTS NEED TO HAVE A COLONOSCOPY STARTING AT THE AGE OF 40.

## 2014-11-12 NOTE — Telephone Encounter (Signed)
ON RECALL FOR OV AND  TCS  °

## 2014-11-14 NOTE — Telephone Encounter (Signed)
PT is aware of results.  

## 2014-11-18 ENCOUNTER — Encounter (HOSPITAL_COMMUNITY): Payer: Self-pay | Admitting: Hematology & Oncology

## 2014-12-03 ENCOUNTER — Telehealth: Payer: Self-pay | Admitting: Orthopedic Surgery

## 2014-12-03 ENCOUNTER — Other Ambulatory Visit: Payer: Self-pay | Admitting: *Deleted

## 2014-12-03 DIAGNOSIS — G8929 Other chronic pain: Secondary | ICD-10-CM

## 2014-12-03 MED ORDER — HYDROCODONE-ACETAMINOPHEN 5-325 MG PO TABS: 1.0000 | ORAL_TABLET | ORAL | Status: DC | PRN

## 2014-12-03 NOTE — Telephone Encounter (Signed)
Patient is calling requesting a refill on pain medication HYDROcodone-acetaminophen (NORCO) 5-325 MG per tablet please advise?

## 2014-12-03 NOTE — Telephone Encounter (Signed)
Prescription available, patient aware  

## 2014-12-04 ENCOUNTER — Ambulatory Visit: Payer: Medicare Other | Admitting: Urology

## 2014-12-04 NOTE — Telephone Encounter (Signed)
Patient Picked up Rx 

## 2015-01-10 ENCOUNTER — Encounter: Payer: Self-pay | Admitting: Gastroenterology

## 2015-01-14 ENCOUNTER — Telehealth: Payer: Self-pay | Admitting: Orthopedic Surgery

## 2015-01-14 ENCOUNTER — Other Ambulatory Visit: Payer: Self-pay | Admitting: *Deleted

## 2015-01-14 DIAGNOSIS — G8929 Other chronic pain: Secondary | ICD-10-CM

## 2015-01-14 NOTE — Telephone Encounter (Signed)
Patient called for refill on medication:  HYDROcodone-acetaminophen (NORCO) 5-325 MG per tablet [id number] - ph# [phone]

## 2015-01-14 NOTE — Telephone Encounter (Signed)
We have been providing scripts but patient has not had office visit since 2011, do you want to continue to fill this?

## 2015-01-15 ENCOUNTER — Other Ambulatory Visit: Payer: Self-pay | Admitting: *Deleted

## 2015-01-15 DIAGNOSIS — G8929 Other chronic pain: Secondary | ICD-10-CM

## 2015-01-15 MED ORDER — HYDROCODONE-ACETAMINOPHEN 5-325 MG PO TABS: 1.0000 | ORAL_TABLET | ORAL | Status: DC | PRN

## 2015-01-15 NOTE — Telephone Encounter (Signed)
Prescription available, patient aware  

## 2015-01-15 NOTE — Telephone Encounter (Signed)
Patient picked up Rx

## 2015-01-15 NOTE — Telephone Encounter (Signed)
Yes,  Schedule him for visit sometime this month

## 2015-01-24 ENCOUNTER — Other Ambulatory Visit (HOSPITAL_COMMUNITY): Payer: Medicare Other

## 2015-01-24 ENCOUNTER — Ambulatory Visit (HOSPITAL_COMMUNITY): Payer: Medicare Other | Admitting: Hematology & Oncology

## 2015-01-28 NOTE — Progress Notes (Signed)
This encounter was created in error - please disregard.

## 2015-01-29 ENCOUNTER — Ambulatory Visit (INDEPENDENT_AMBULATORY_CARE_PROVIDER_SITE_OTHER): Payer: Medicare Other

## 2015-01-29 ENCOUNTER — Ambulatory Visit (INDEPENDENT_AMBULATORY_CARE_PROVIDER_SITE_OTHER): Payer: Medicare Other | Admitting: Orthopedic Surgery

## 2015-01-29 ENCOUNTER — Ambulatory Visit: Payer: Medicare Other

## 2015-01-29 ENCOUNTER — Encounter: Payer: Self-pay | Admitting: Orthopedic Surgery

## 2015-01-29 VITALS — BP 125/78 | Ht 72.5 in | Wt 240.0 lb

## 2015-01-29 DIAGNOSIS — G8929 Other chronic pain: Secondary | ICD-10-CM

## 2015-01-29 DIAGNOSIS — Z96652 Presence of left artificial knee joint: Secondary | ICD-10-CM

## 2015-01-29 DIAGNOSIS — Z96651 Presence of right artificial knee joint: Secondary | ICD-10-CM

## 2015-01-29 NOTE — Progress Notes (Signed)
Patient ID: [name], male   DOB: 1951-02-21, 63 y.o.   MRN: [id number] Chief Complaint  Patient presents with  . Follow-up    follow up bilateral knees + xrays, RT TKA 09/18/08, LT TKA 06/29/06    Right total knee 6 years ago left total knee 9 years ago 2010 2007 respectively  Doing well with both has some stiffness on the left knee. He had. Prior medial collateral ligament repair prior to that surgery.  Past Medical History  Diagnosis Date  . Diverticulosis 2008 LGIB  . Gout   . HTN (hypertension)   . History of septic arthritis   . History of alcohol abuse   . Anemia FeDA: GASTRIC POLYPS, B12    TCS 2008 EGD 2009, 2008-HB 11.1 MCV 83.6 CR 1.22, 2009 FERRITIN 102-22  . Hepatomegaly 2o to FATTY LIVER DZ  . Chronic knee pain   . B12 deficiency   . Hypothyroidism   . DIVERTICULOSIS, COLON 08/15/2006    Qualifier: Diagnosis of  By: Jonna Munro MD, Roderic Scarce    . PUD 11/30/2008    Qualifier: History of  By: Jonna Munro MD, Roderic Scarce       He is walking with a cane  His review of systems is negative except for the stiffness in the left knee and mild discomfort there  X-rays of both knees are ordered and read as stable prosthesis without signs of loosening and nose are independent reports read by me  Left knee 90 of flexion  Right knee 105 of flexion. Both knees come to full extension. Both knees are stable. Both knees have normal skin incisions he does have bilateral mild peripheral edema  Follow-up in a year  Continue Norco 5 mg for pain. He is on chronic hydrocodone

## 2015-02-05 ENCOUNTER — Encounter (HOSPITAL_COMMUNITY): Payer: Self-pay | Admitting: Emergency Medicine

## 2015-02-05 ENCOUNTER — Emergency Department (HOSPITAL_COMMUNITY)
Admission: EM | Admit: 2015-02-05 | Discharge: 2015-02-05 | Disposition: A | Discharge status: Home / Self Care | Payer: Medicare Other | Attending: Emergency Medicine | Admitting: Emergency Medicine

## 2015-02-05 DIAGNOSIS — Y939 Activity, unspecified: Secondary | ICD-10-CM | POA: Insufficient documentation

## 2015-02-05 DIAGNOSIS — E039 Hypothyroidism, unspecified: Secondary | ICD-10-CM | POA: Diagnosis not present

## 2015-02-05 DIAGNOSIS — X58XXXA Exposure to other specified factors, initial encounter: Secondary | ICD-10-CM | POA: Diagnosis not present

## 2015-02-05 DIAGNOSIS — Z8719 Personal history of other diseases of the digestive system: Secondary | ICD-10-CM | POA: Diagnosis not present

## 2015-02-05 DIAGNOSIS — I10 Essential (primary) hypertension: Secondary | ICD-10-CM | POA: Insufficient documentation

## 2015-02-05 DIAGNOSIS — Y999 Unspecified external cause status: Secondary | ICD-10-CM | POA: Diagnosis not present

## 2015-02-05 DIAGNOSIS — Y929 Unspecified place or not applicable: Secondary | ICD-10-CM | POA: Insufficient documentation

## 2015-02-05 DIAGNOSIS — S199XXA Unspecified injury of neck, initial encounter: Secondary | ICD-10-CM | POA: Diagnosis present

## 2015-02-05 DIAGNOSIS — S161XXA Strain of muscle, fascia and tendon at neck level, initial encounter: Secondary | ICD-10-CM | POA: Insufficient documentation

## 2015-02-05 DIAGNOSIS — Z8739 Personal history of other diseases of the musculoskeletal system and connective tissue: Secondary | ICD-10-CM | POA: Diagnosis not present

## 2015-02-05 DIAGNOSIS — G8929 Other chronic pain: Secondary | ICD-10-CM | POA: Diagnosis not present

## 2015-02-05 DIAGNOSIS — Z79899 Other long term (current) drug therapy: Secondary | ICD-10-CM | POA: Insufficient documentation

## 2015-02-05 DIAGNOSIS — Z862 Personal history of diseases of the blood and blood-forming organs and certain disorders involving the immune mechanism: Secondary | ICD-10-CM | POA: Insufficient documentation

## 2015-02-05 DIAGNOSIS — Z8711 Personal history of peptic ulcer disease: Secondary | ICD-10-CM | POA: Insufficient documentation

## 2015-02-05 MED ORDER — TRAMADOL HCL 50 MG PO TABS: 50.0000 mg | ORAL_TABLET | Freq: Four times a day (QID) | ORAL | Status: DC | PRN

## 2015-02-05 MED ORDER — CYCLOBENZAPRINE HCL 10 MG PO TABS: 10.0000 mg | ORAL_TABLET | Freq: Three times a day (TID) | ORAL | Status: DC | PRN

## 2015-02-05 NOTE — Discharge Instructions (Signed)

## 2015-02-05 NOTE — ED Notes (Addendum)
PT c/o neck pain radiates into right shoulder x2 days. PT denies any injury and states hx of neck pain before. PT c/o difficulty and stiffness to neck while looking over left shoulder.

## 2015-02-05 NOTE — ED Provider Notes (Signed)
CSN: [id number]     Arrival date & time 02/05/15  [id number] History  This chart was scribed for Orpah Greek, MD by Rayna Sexton, ED scribe. This patient was seen in room [facility]09[facility]09 and the patient's care was started at 9:46 AM.    Chief Complaint  Patient presents with  . Neck Pain    The history is provided by the patient. No language interpreter was used.    HPI Comments: [name] is a 64 y.o. male, with a history of neck pain, who presents to the Emergency Department complaining of mild, constant, neck pain with onset 2 days ago. Pt notes the pain moved to his left shoulder earlier today. Pt denies CP and SOB. Pt denies any other associated symptoms.    Past Medical History  Diagnosis Date  . Diverticulosis 2008 LGIB  . Gout   . HTN (hypertension)   . History of septic arthritis   . History of alcohol abuse   . Anemia FeDA: GASTRIC POLYPS, B12    TCS 2008 EGD 2009, 2008-HB 11.1 MCV 83.6 CR 1.22, 2009 FERRITIN 102-22  . Hepatomegaly 2o to FATTY LIVER DZ  . Chronic knee pain   . B12 deficiency   . Hypothyroidism   . DIVERTICULOSIS, COLON 08/15/2006    Qualifier: Diagnosis of  By: Jonna Munro MD, Roderic Scarce    . PUD 11/30/2008    Qualifier: History of  By: Jonna Munro MD, Roderic Scarce     Past Surgical History  Procedure Laterality Date  . Upper gastrointestinal endoscopy  APR 2009    INFLAMED HYPERPLASTIC POLYPS, CHRONIC GASTRITIS  . Colonoscopy  2008 [facility] DJ    LGIB 2o to TICS, prep good  . Replacement total knee bilateral    . Hand surgery    . Cholecystectomy    . Esophagogastroduodenoscopy  12/08/2007    [id number] gastric polyps seen in the cardia and body of the stomach/Normal esophagus without evidence of Barrett's, mass, erosion/Normal duodenal bulb and second portion of the duodenum. Benign bx.  . Colonoscopy with propofol N/A 08/14/2014    SLF: 1. Four large colon polyps removed. 2. The left colon is redundant 3. Moderate diverticulosis throughout teh entire  examined colon  . Esophagogastroduodenoscopy (egd) with propofol N/A 08/14/2014    SLF: 1. Heme postive stools due to gastric and colon polyps 2. Multiple gastric   . Polypectomy  08/14/2014    Procedure: POLYPECTOMY;  Surgeon: Danie Binder, MD;  Location: [facility];  Service: Endoscopy;;  . Esophageal biopsy  08/14/2014    Procedure: BIOPSY;  Surgeon: Danie Binder, MD;  Location: [facility];  Service: Endoscopy;;  . Colonoscopy with propofol N/A 11/06/2014    Procedure: COLONOSCOPY WITH PROPOFOL;  Surgeon: Danie Binder, MD;  Location: [facility];  Service: Endoscopy;  Laterality: N/A;  Did not go to cecum was repeat exam  . Polypectomy N/A 11/06/2014    Procedure: POLYPECTOMY;  Surgeon: Danie Binder, MD;  Location: [facility];  Service: Endoscopy;  Laterality: N/A;  Transverse Colon x3, Ascending Colon x2, Descending Colon x3   Family History  Problem Relation Age of Onset  . Colon polyps Neg Hx   . Colon cancer Neg Hx   . Pancreatic disease Neg Hx   . Hypertension Sister   . Hypertension Brother   . Diabetes Brother    History  Substance Use Topics  . Smoking status: Never Smoker   . Smokeless tobacco: Never Used     Comment: Quit x 10 years/  never smoked on regular basis  . Alcohol Use: 1.2 oz/week    2 Shots of liquor per week     Comment: drinks on weekends, gin/vodka 1/5th.    Review of Systems  Respiratory: Negative for shortness of breath.   Musculoskeletal: Positive for neck stiffness.  All other systems reviewed and are negative.     Allergies  Aspirin  Home Medications   Prior to Admission medications   Medication Sig Start Date End Date Taking? Authorizing Provider  amLODipine (NORVASC) 10 MG tablet Take 10 mg by mouth daily.    Historical Provider, MD  cyanocobalamin (,VITAMIN B-12,) 1000 MCG/ML injection Inject 1,000 mcg into the muscle every 30 (thirty) days.      Historical Provider, MD  febuxostat (ULORIC) 40 MG tablet Take 40 mg by mouth daily.     Historical  Provider, MD  HYDROcodone-acetaminophen (NORCO) 5-325 MG per tablet Take 1 tablet by mouth every 4 (four) hours as needed. 01/15/15   Carole Civil, MD  levothyroxine (SYNTHROID, LEVOTHROID) 175 MCG tablet Take 175 mcg by mouth daily before breakfast.  05/02/14   Historical Provider, MD  metoprolol succinate (TOPROL-XL) 25 MG 24 hr tablet Take 25 mg by mouth every morning.     Historical Provider, MD  omeprazole (PRILOSEC) 20 MG capsule Take 20 mg by mouth daily.      Historical Provider, MD  tamsulosin (FLOMAX) 0.4 MG CAPS capsule Take 0.4 mg by mouth daily.    Historical Provider, MD   BP 138/92 mmHg  Pulse 90  Temp(Src) 98.3 F (36.8 C) (Oral)  Resp 18  Ht 6\' 6"  (1.981 m)  Wt 244 lb (110.678 kg)  BMI 28.20 kg/m2  SpO2 98% Physical Exam  Constitutional: He is oriented to person, place, and time. He appears well-developed and well-nourished. No distress.  HENT:  Head: Normocephalic and atraumatic.  Right Ear: Hearing normal.  Left Ear: Hearing normal.  Nose: Nose normal.  Mouth/Throat: Oropharynx is clear and moist and mucous membranes are normal.  Eyes: Conjunctivae and EOM are normal. Pupils are equal, round, and reactive to light.  Neck: Normal range of motion. Neck supple.  Cardiovascular: Regular rhythm, S1 normal and S2 normal.  Exam reveals no gallop and no friction rub.   No murmur heard. Pulmonary/Chest: Effort normal and breath sounds normal. No respiratory distress. He exhibits no tenderness.  Abdominal: Soft. Normal appearance and bowel sounds are normal. There is no hepatosplenomegaly. There is no tenderness. There is no rebound, no guarding, no tenderness at McBurney's point and negative Murphy's sign. No hernia.  Musculoskeletal: Normal range of motion.  Left paraspinal tenderness Pain with ROM especially when turning to the left No meningismus  Normal grip strength, flection, extension, upper extremities, normal reflexes upper extremities  Neurological: He is  alert and oriented to person, place, and time. He has normal strength. No cranial nerve deficit or sensory deficit. Coordination normal. GCS eye subscore is 4. GCS verbal subscore is 5. GCS motor subscore is 6.  Skin: Skin is warm, dry and intact. No rash noted. No cyanosis.  Psychiatric: He has a normal mood and affect. His speech is normal and behavior is normal. Thought content normal.  Nursing note and vitals reviewed.   ED Course  Procedures DIAGNOSTIC STUDIES: Oxygen Saturation is 98% on RA, normal by my interpretation.    COORDINATION OF CARE: 9:48 AM Discussed treatment plan with pt at bedside and pt agreed to plan.  Labs Review Labs Reviewed - No data  to display  Imaging Review No results found.   EKG Interpretation None      MDM   Final diagnoses:  None  cervical strain   Presents to the emergency department for evaluation of pain in his neck. Patient reports 2 days ago he had pain in the right side of his neck, now the pain is in the left side of his neck. Patient has tenderness to palpation and pain is reproduced with range of motion of the neck, especially to the left. He does not have any evidence of neurologic deficit on examination. Examination supports clear diagnosis of musculoskeletal pain. He is not experiencing any chest pain and there is no concern for cardiac etiology. Patient will be treated with ultram and muscle relaxer.  I personally performed the services described in this documentation, which was scribed in my presence. The recorded information has been reviewed and is accurate.      Orpah Greek, MD 02/05/15 [phone]

## 2015-02-11 ENCOUNTER — Encounter (HOSPITAL_COMMUNITY): Payer: Medicare Other | Attending: Hematology & Oncology | Admitting: Hematology & Oncology

## 2015-02-11 ENCOUNTER — Encounter (HOSPITAL_BASED_OUTPATIENT_CLINIC_OR_DEPARTMENT_OTHER): Payer: Medicare Other

## 2015-02-11 VITALS — BP 129/78 | HR 114 | Temp 98.0°F | Wt 246.6 lb

## 2015-02-11 DIAGNOSIS — D489 Neoplasm of uncertain behavior, unspecified: Secondary | ICD-10-CM | POA: Insufficient documentation

## 2015-02-11 DIAGNOSIS — C7A8 Other malignant neuroendocrine tumors: Secondary | ICD-10-CM | POA: Diagnosis not present

## 2015-02-11 DIAGNOSIS — C7A00 Malignant carcinoid tumor of unspecified site: Secondary | ICD-10-CM

## 2015-02-11 DIAGNOSIS — D6489 Other specified anemias: Secondary | ICD-10-CM | POA: Insufficient documentation

## 2015-02-11 DIAGNOSIS — D3A8 Other benign neuroendocrine tumors: Secondary | ICD-10-CM

## 2015-02-11 LAB — COMPREHENSIVE METABOLIC PANEL
ALBUMIN: 3.5 g/dL (ref 3.5–5.0)
ALK PHOS: 64 U/L (ref 38–126)
ALT: 13 U/L — AB (ref 17–63)
AST: 24 U/L (ref 15–41)
Anion gap: 12 (ref 5–15)
BUN: 15 mg/dL (ref 6–20)
CHLORIDE: 104 mmol/L (ref 101–111)
CO2: 23 mmol/L (ref 22–32)
CREATININE: 1.34 mg/dL — AB (ref 0.61–1.24)
Calcium: 9 mg/dL (ref 8.9–10.3)
GFR calc Af Amer: >60 mL/min (ref >60)
GFR, EST NON AFRICAN AMERICAN: 54 mL/min — AB (ref >60)
Glucose, Bld: 101 mg/dL — ABNORMAL HIGH (ref 65–99)
POTASSIUM: 3.5 mmol/L (ref 3.5–5.1)
Sodium: 139 mmol/L (ref 135–145)
Total Bilirubin: 1.1 mg/dL (ref 0.3–1.2)
Total Protein: 8.3 g/dL — ABNORMAL HIGH (ref 6.5–8.1)

## 2015-02-11 LAB — CBC WITH DIFFERENTIAL/PLATELET
Basophils Absolute: 0 10*3/uL (ref 0.0–0.1)
Basophils Relative: 0 % (ref 0–1)
EOS ABS: 0.1 10*3/uL (ref 0.0–0.7)
EOS PCT: 2 % (ref 0–5)
HCT: 38.4 % — ABNORMAL LOW (ref 39.0–52.0)
HEMOGLOBIN: 12.6 g/dL — AB (ref 13.0–17.0)
LYMPHS ABS: 2.9 10*3/uL (ref 0.7–4.0)
Lymphocytes Relative: 32 % (ref 12–46)
MCH: 28.9 pg (ref 26.0–34.0)
MCHC: 32.8 g/dL (ref 30.0–36.0)
MCV: 88.1 fL (ref 78.0–100.0)
MONOS PCT: 14 % — AB (ref 3–12)
Monocytes Absolute: 1.3 10*3/uL — ABNORMAL HIGH (ref 0.1–1.0)
Neutro Abs: 4.7 10*3/uL (ref 1.7–7.7)
Neutrophils Relative %: 52 % (ref 43–77)
Platelets: 187 10*3/uL (ref 150–400)
RBC: 4.36 MIL/uL (ref 4.22–5.81)
RDW: 13.2 % (ref 11.5–15.5)
WBC: 9.1 10*3/uL (ref 4.0–10.5)

## 2015-02-11 NOTE — Patient Instructions (Signed)
..  [place] at [facility] Discharge Instructions  RECOMMENDATIONS MADE BY THE CONSULTANT AND ANY TEST RESULTS WILL BE SENT TO YOUR REFERRING PHYSICIAN.  Follow up with the MD in 2 months as well as we will draw labs the day of your appointment.    Thank you for choosing [place] at [facility] to provide your oncology and hematology care.  To afford each patient quality time with our provider, please arrive at least 15 minutes before your scheduled appointment time.    You need to re-schedule your appointment should you arrive 10 or more minutes late.  We strive to give you quality time with our providers, and arriving late affects you and other patients whose appointments are after yours.  Also, if you no show three or more times for appointments you may be dismissed from the clinic at the providers discretion.     Again, thank you for choosing [facility].  Our hope is that these requests will decrease the amount of time that you wait before being seen by our physicians.       _____________________________________________________________  Should you have questions after your visit to [facility], please contact our office at [phone] between the hours of 8:30 a.m. and 4:30 p.m.  Voicemails left after 4:30 p.m. will not be returned until the following business day.  For prescription refill requests, have your pharmacy contact our office.

## 2015-02-11 NOTE — Progress Notes (Signed)
LABS DRAWN

## 2015-02-11 NOTE — Progress Notes (Signed)
This encounter was created in error - please disregard. [place] Progress Note  Patient Care Team: Marjean Donna, MD as PCP - General (Family Medicine) Danie Binder, MD (Gastroenterology)  CHIEF COMPLAINTS/PURPOSE OF CONSULTATION:   EGD with biopsy showing low grade neuroendocrine tumor Fatty liver ETOH abuse Elevated IgA with normal SPEP GI Tumor Board recommendations, close f/u EGD's with anatomical biopsies Octreotide scan on 09/17/2014 is negative for octreotide avid metastases  HISTORY OF PRESENTING ILLNESS:  [name] 64 y.o. male is here because of a carcinoid tumor found on EGD. He denies any major problems. He is active, walks daily.  He has a good appetite. He continues to watch the young and the restless. He notes that is an important part of his day. Appt on 6/17, Neil Crouch, GI; will call to have EGD scheduled  He had a colonoscopy done in March. He takes his B12 shots consistently every month, Dr. Tally Due   He has no major complaints. No diarrhea, no palpitations. No wheezing or SOB.  MEDICAL HISTORY:  Past Medical History  Diagnosis Date  . Diverticulosis 2008 LGIB  . Gout   . HTN (hypertension)   . History of septic arthritis   . History of alcohol abuse   . Anemia FeDA: GASTRIC POLYPS, B12    TCS 2008 EGD 2009, 2008-HB 11.1 MCV 83.6 CR 1.22, 2009 FERRITIN 102-22  . Hepatomegaly 2o to FATTY LIVER DZ  . Chronic knee pain   . B12 deficiency   . Hypothyroidism   . DIVERTICULOSIS, COLON 08/15/2006    Qualifier: Diagnosis of  By: Jonna Munro MD, Roderic Scarce    . PUD 11/30/2008    Qualifier: History of  By: Jonna Munro MD, Roderic Scarce      SURGICAL HISTORY: Past Surgical History  Procedure Laterality Date  . Upper gastrointestinal endoscopy  APR 2009    INFLAMED HYPERPLASTIC POLYPS, CHRONIC GASTRITIS  . Colonoscopy  2008 [facility] DJ    LGIB 2o to TICS, prep good  . Replacement total knee bilateral    . Hand surgery    . Cholecystectomy    .  Esophagogastroduodenoscopy  12/08/2007    [id number] gastric polyps seen in the cardia and body of the stomach/Normal esophagus without evidence of Barrett's, mass, erosion/Normal duodenal bulb and second portion of the duodenum. Benign bx.  . Colonoscopy with propofol N/A 08/14/2014    SLF: 1. Four large colon polyps removed. 2. The left colon is redundant 3. Moderate diverticulosis throughout teh entire examined colon  . Esophagogastroduodenoscopy (egd) with propofol N/A 08/14/2014    SLF: 1. Heme postive stools due to gastric and colon polyps 2. Multiple gastric   . Polypectomy  08/14/2014    Procedure: POLYPECTOMY;  Surgeon: Danie Binder, MD;  Location: [facility];  Service: Endoscopy;;  . Esophageal biopsy  08/14/2014    Procedure: BIOPSY;  Surgeon: Danie Binder, MD;  Location: [facility];  Service: Endoscopy;;  . Colonoscopy with propofol N/A 11/06/2014    Procedure: COLONOSCOPY WITH PROPOFOL;  Surgeon: Danie Binder, MD;  Location: [facility];  Service: Endoscopy;  Laterality: N/A;  Did not go to cecum was repeat exam  . Polypectomy N/A 11/06/2014    Procedure: POLYPECTOMY;  Surgeon: Danie Binder, MD;  Location: [facility];  Service: Endoscopy;  Laterality: N/A;  Transverse Colon x3, Ascending Colon x2, Descending Colon x3    SOCIAL HISTORY: History   Social History  . Marital Status: Single    Spouse Name: N/A  .  Number of Children: 0  . Years of Education: N/A   Occupational History  . retired    Social History Main Topics  . Smoking status: Never Smoker   . Smokeless tobacco: Never Used     Comment: Quit x 10 years/ never smoked on regular basis  . Alcohol Use: 1.2 oz/week    2 Shots of liquor per week     Comment: drinks on weekends, gin/vodka 1/5th.  . Drug Use: No  . Sexual Activity: No   Other Topics Concern  . Not on file   Social History Narrative   HE DOES NOT HAVE ANY CHILDREN.  Born in [place]. Used to grow tobacco. Also worked in Architect.  FAMILY  HISTORY: Family History  Problem Relation Age of Onset  . Colon polyps Neg Hx   . Colon cancer Neg Hx   . Pancreatic disease Neg Hx   . Hypertension Sister   . Hypertension Brother   . Diabetes Brother    has no family status information on file.   Mother died at 88 Father died at 82 4 living siblings. One brother died from complications of diabetes   ALLERGIES:  is allergic to aspirin.  MEDICATIONS:  Current Outpatient Prescriptions  Medication Sig Dispense Refill  . amLODipine (NORVASC) 10 MG tablet Take 10 mg by mouth daily.    . cyanocobalamin (,VITAMIN B-12,) 1000 MCG/ML injection Inject 1,000 mcg into the muscle every 30 (thirty) days.      . cyclobenzaprine (FLEXERIL) 10 MG tablet Take 1 tablet (10 mg total) by mouth 3 (three) times daily as needed for muscle spasms. 20 tablet 0  . febuxostat (ULORIC) 40 MG tablet Take 40 mg by mouth daily.     Marland Kitchen HYDROcodone-acetaminophen (NORCO) 5-325 MG per tablet Take 1 tablet by mouth every 4 (four) hours as needed. 180 tablet 0  . levothyroxine (SYNTHROID, LEVOTHROID) 175 MCG tablet Take 175 mcg by mouth daily before breakfast.     . metoprolol succinate (TOPROL-XL) 25 MG 24 hr tablet Take 25 mg by mouth 2 (two) times daily.     Marland Kitchen omeprazole (PRILOSEC) 20 MG capsule Take 20 mg by mouth daily.      . tamsulosin (FLOMAX) 0.4 MG CAPS capsule Take 0.4 mg by mouth daily.    . traMADol (ULTRAM) 50 MG tablet Take 1 tablet (50 mg total) by mouth every 6 (six) hours as needed. 15 tablet 0   No current facility-administered medications for this visit.    Review of Systems  Constitutional: Negative.   HENT: Negative.   Eyes: Negative.   Respiratory: Negative.   Cardiovascular: Negative.   Gastrointestinal: Negative.   Genitourinary: Negative.   Musculoskeletal: Negative.   Skin: Negative.   Neurological: Negative.   Endo/Heme/Allergies: Negative.   Psychiatric/Behavioral: Negative.   14 point review of systems was performed and is  negative except as detailed under history of present illness and above   PHYSICAL EXAMINATION: ECOG PERFORMANCE STATUS: 0 - Asymptomatic  Filed Vitals:   02/11/15 0954  BP: 129/78  Pulse: 114  Temp: 98 F (36.7 C)   Filed Weights   02/11/15 0954  Weight: 246 lb 9.6 oz (111.857 kg)    Physical Exam  Constitutional: He is oriented to person, place, and time and well-developed, well-nourished, and in no distress.  HENT:  Head: Normocephalic and atraumatic.  Nose: Nose normal.  Mouth/Throat: Oropharynx is clear and moist. No oropharyngeal exudate.  Eyes: Conjunctivae and EOM are normal. Pupils are equal,  round, and reactive to light. Right eye exhibits no discharge. Left eye exhibits no discharge. No scleral icterus.  Neck: Normal range of motion. Neck supple. No tracheal deviation present. No thyromegaly present.  Cardiovascular: Normal rate, regular rhythm and normal heart sounds.  Exam reveals no gallop and no friction rub.   No murmur heard. Pulmonary/Chest: Effort normal and breath sounds normal. He has no wheezes. He has no rales.  Abdominal: Soft. Bowel sounds are normal. He exhibits no distension and no mass. There is no tenderness. There is no rebound and no guarding.  Musculoskeletal: Normal range of motion. He exhibits no edema.  Lymphadenopathy:    He has no cervical adenopathy.  Neurological: He is alert and oriented to person, place, and time. He has normal reflexes. No cranial nerve deficit. Gait normal. Coordination normal.  Skin: Skin is warm and dry. No rash noted.  Psychiatric: Mood, memory, affect and judgment normal.  Nursing note and vitals reviewed.    LABORATORY DATA:  I have reviewed the data as listed Lab Results  Component Value Date   WBC 9.1 02/11/2015   HGB 12.6* 02/11/2015   HCT 38.4* 02/11/2015   MCV 88.1 02/11/2015   PLT 187 02/11/2015     Chemistry      Component Value Date/Time   NA 139 11/01/2014 1015   K 3.9 11/01/2014 1015   CL  105 11/01/2014 1015   CO2 27 11/01/2014 1015   BUN 16 11/01/2014 1015   CREATININE 1.23 11/01/2014 1015   CREATININE 1.30 02/24/2012 1023      Component Value Date/Time   CALCIUM 9.3 11/01/2014 1015   ALKPHOS 60 09/08/2012 1048   AST 93* 09/08/2012 1048   ALT 42 09/08/2012 1048   BILITOT 1.3* 09/08/2012 1048      Diagnosis 1. Colon, polyp(s), cecal - TUBULAR ADENOMA. NO HIGH GRADE DYSPLASIA OR MALIGNANCY IDENTIFIED. 2. Colon, polyp(s), descending - TUBULOVILLOUS ADENOMA. NO HIGH GRADE DYSPLASIA OR MALIGNANCY IDENTIFIED. 3. Stomach, polyp(s) - LOW GRADE NEUROENDOCRINE TUMOR. - INTESTINAL METAPLASIA AND INFLAMMATION CONSISTENT WITH CHRONIC ATROPHIC GASTRITIS. - SEE MICROSCOPIC DESCRIPTION. 4. Stomach, biopsy - FOCAL INVOLVEMENT BY LOW GRADE NEUROENDOCRINE TUMOR. - CHRONIC GASTRITIS. - SEE MICROSCOPIC DESCRIPTION. Microscopic Comment 3. Within the stroma there are sheets and nests of epithelial cells with round to oval, relatively uniform nuclei and small nucleoli and the morphologic features are consistent with low grade neuroendocrine tumor (carcinoid tumor). Immunohistochemistry is attempted and the tumor is not sufficiently present in the immunohistochemical slides. There is also chronic inflammation with intestinal metaplasia consistent with chronic atrophic gastritis. 4. There is chronic inflammation consistent with chronic gastritis. There is also focal involvement by similar appearing low grade neuroendocrine tumor (carcinoid tumor). Case discussed with Dr. Oneida Alar on 08/17/14. (JDP:gt, 08/15/14) Claudette Laws MD Pathologist, Electronic Signature (Case signed 08/17/2014)   ASSESSMENT & PLAN:   Carcinoid, gastric  Carcinoid tumor was found in a gastric polyp and on a random biopsy. The patient follows up with Dr. Oneida Alar. I discussed with him  the tumor board recommendations. He has follow-up with GI next week and EGD should be scheduled again. He will have ongoing  surveillance EGDs. I have also recommended he follow with [place] on a regular basis with ongoing laboratory studies. Octreotide scan did not suggest evidence of diffuse involvement of his gastric mucosa.   I will see him back in 3 months with repeat laboratory studies, sooner if needed.  All questions were answered. The patient knows to call the clinic with  any problems, questions or concerns. //  This document serves as a record of services personally performed by Ancil Linsey, MD. It was created on her behalf by Janace Hoard, a trained medical scribe. The creation of this record is based on the scribe's personal observations and the provider's statements to them. This document has been checked and approved by the attending provider.  I have reviewed the above documentation for accuracy and completeness, and I agree with the above. This note was electronically signed. Kelby Fam. Whitney Muse, MD

## 2015-02-12 LAB — CHROMOGRANIN A: CHROMOGRANIN A: 5 nmol/L (ref 0–5)

## 2015-02-13 LAB — SEROTONIN SERUM: SEROTONIN, SERUM: 39 ng/mL (ref 21–321)

## 2015-02-15 ENCOUNTER — Encounter: Payer: Self-pay | Admitting: Gastroenterology

## 2015-02-15 ENCOUNTER — Other Ambulatory Visit: Payer: Self-pay

## 2015-02-15 ENCOUNTER — Ambulatory Visit (INDEPENDENT_AMBULATORY_CARE_PROVIDER_SITE_OTHER): Payer: Medicare Other | Admitting: Gastroenterology

## 2015-02-15 VITALS — BP 129/74 | HR 96 | Temp 98.4°F | Ht 73.0 in | Wt 245.6 lb

## 2015-02-15 DIAGNOSIS — K219 Gastro-esophageal reflux disease without esophagitis: Secondary | ICD-10-CM

## 2015-02-15 DIAGNOSIS — K760 Fatty (change of) liver, not elsewhere classified: Secondary | ICD-10-CM

## 2015-02-15 DIAGNOSIS — D3A8 Other benign neuroendocrine tumors: Secondary | ICD-10-CM

## 2015-02-15 DIAGNOSIS — D489 Neoplasm of uncertain behavior, unspecified: Secondary | ICD-10-CM

## 2015-02-15 DIAGNOSIS — D126 Benign neoplasm of colon, unspecified: Secondary | ICD-10-CM | POA: Diagnosis not present

## 2015-02-15 NOTE — Assessment & Plan Note (Signed)
F3/F4 Metavir on U/S. No CT evidence of cirrhosis or endoscopic evidence of portal hypertension.   Instructions for fatty liver: Recommend 1-2# weight loss per week until ideal body weight through exercise & diet. Low fat/cholesterol diet.   Avoid sweets, sodas, fruit juices, sweetened beverages like tea, etc. Gradually increase exercise from 15 min daily up to 1 hr per day 5 days/week. Limit alcohol use.  Return to office in four months to see SLF.

## 2015-02-15 NOTE — Assessment & Plan Note (Signed)
Due for next TCS in 10/2017.

## 2015-02-15 NOTE — Assessment & Plan Note (Signed)
Well controlled on omeprazole 

## 2015-02-15 NOTE — Assessment & Plan Note (Signed)
Per Dr. Donald Pore note, patient needs surveillance EGD every six months. Discussed at tumor board. Due for EGD at this time.  I have discussed the risks, alternatives, benefits with regards to but not limited to the risk of reaction to medication, bleeding, infection, perforation and the patient is agreeable to proceed. Written consent to be obtained.  EGD in OR due to chronic narcotics and etoh use.

## 2015-02-15 NOTE — Progress Notes (Signed)
Please change NIC from 04/2015 to 05/2015 with SLF.

## 2015-02-15 NOTE — Progress Notes (Signed)
Primary Care Physician: [name], MD  Primary Gastroenterologist:  Barney Drain, MD   Chief Complaint  Patient presents with  . Follow-up    HPI: [name] is a 64 y.o. male here to follow-up gastric carcinoid tumor, history of tubulovillous adenomas, fatty liver, alcohol abuse, h/o IDA and B12 deficiency.  Colonoscopy in March 2016 for personal history of colon polyps, inadequate prep of the right colon in December 2015. He had 8 small polyps removed. One large pedunculated polyp removed from the descending colon. Moderate diverticulosis in the sigmoid and descending colon, moderate sized internal hemorrhoids. Hemostasis clip was placed during procedure. Ascending colon polyp was tubulovillous and tubular adenoma. No high-grade dysplasia. Multiple other tubular adenomas. Next colonoscopy in 3 years.  EGD December 2015 showed a low-grade carcinoid stomach tumor, now following with Dr. Whitney Muse. Octreotide scan in January was negative for avid metastases. He was discussed at tumor board. Recommended to have ongoing surveillance EGDs.  Patient presents today without any complaints. Denies any unintentional weight loss, abdominal pain, anorexia, vomiting, constipation, diarrhea, melena, rectal bleeding. No chest pain or shortness of breath. No diaphoresis.  Metavir fibrosis score is F3/F4 via ultrasound 07/2014. CT Abd with contrast shows unremarkable liver and no indication of overt cirrhosis.   Current Outpatient Prescriptions  Medication Sig Dispense Refill  . amLODipine (NORVASC) 10 MG tablet Take 10 mg by mouth daily.    . cyanocobalamin (,VITAMIN B-12,) 1000 MCG/ML injection Inject 1,000 mcg into the muscle every 30 (thirty) days.      . cyclobenzaprine (FLEXERIL) 10 MG tablet Take 1 tablet (10 mg total) by mouth 3 (three) times daily as needed for muscle spasms. 20 tablet 0  . febuxostat (ULORIC) 40 MG tablet Take 40 mg by mouth daily.     Marland Kitchen HYDROcodone-acetaminophen  (NORCO) 5-325 MG per tablet Take 1 tablet by mouth every 4 (four) hours as needed. 180 tablet 0  . levothyroxine (SYNTHROID, LEVOTHROID) 175 MCG tablet Take 175 mcg by mouth daily before breakfast.     . metoprolol succinate (TOPROL-XL) 25 MG 24 hr tablet Take 25 mg by mouth 2 (two) times daily.     Marland Kitchen omeprazole (PRILOSEC) 20 MG capsule Take 20 mg by mouth daily.      . tamsulosin (FLOMAX) 0.4 MG CAPS capsule Take 0.4 mg by mouth daily.     No current facility-administered medications for this visit.    Allergies as of 02/15/2015 - Review Complete 02/15/2015  Allergen Reaction Noted  . Aspirin  06/01/2007   Past Medical History  Diagnosis Date  . Diverticulosis 2008 LGIB  . Gout   . HTN (hypertension)   . History of septic arthritis   . History of alcohol abuse   . Anemia FeDA: GASTRIC POLYPS, B12    TCS 2008 EGD 2009, 2008-HB 11.1 MCV 83.6 CR 1.22, 2009 FERRITIN 102-22  . Hepatomegaly 2o to FATTY LIVER DZ  . Chronic knee pain   . B12 deficiency   . Hypothyroidism   . DIVERTICULOSIS, COLON 08/15/2006    Qualifier: Diagnosis of  By: Jonna Munro MD, Roderic Scarce    . PUD 11/30/2008    Qualifier: History of  By: Jonna Munro MD, Roderic Scarce    . Carcinoid tumor determined by biopsy of stomach    Past Surgical History  Procedure Laterality Date  . Upper gastrointestinal endoscopy  APR 2009    INFLAMED HYPERPLASTIC POLYPS, CHRONIC GASTRITIS  . Colonoscopy  2008 [facility] DJ    LGIB 2o to  TICS, prep good  . Replacement total knee bilateral    . Hand surgery    . Cholecystectomy    . Esophagogastroduodenoscopy  12/08/2007    [id number] gastric polyps seen in the cardia and body of the stomach/Normal esophagus without evidence of Barrett's, mass, erosion/Normal duodenal bulb and second portion of the duodenum. Benign bx.  . Colonoscopy with propofol N/A 08/14/2014    SLF: 1. Four large colon polyps removed. 2. The left colon is redundant 3. Moderate diverticulosis throughout teh entire examined colon    . Esophagogastroduodenoscopy (egd) with propofol N/A 08/14/2014    SLF: 1. Heme postive stools due to gastric and colon polyps 2. Multiple gastric   . Polypectomy  08/14/2014    Procedure: POLYPECTOMY;  Surgeon: Danie Binder, MD;  Location: [facility];  Service: Endoscopy;;  . Esophageal biopsy  08/14/2014    Procedure: BIOPSY;  Surgeon: Danie Binder, MD;  Location: [facility];  Service: Endoscopy;;  . Colonoscopy with propofol N/A 11/06/2014    SLF: 8 small polyps removed. One large pedunculated polyp removed from the ascending colon, tubulovillous and tubular adenomas. Next colonoscopy March 2019  . Polypectomy N/A 11/06/2014    Procedure: POLYPECTOMY;  Surgeon: Danie Binder, MD;  Location: [facility];  Service: Endoscopy;  Laterality: N/A;  Transverse Colon x3, Ascending Colon x2, Descending Colon x3   Family History  Problem Relation Age of Onset  . Colon polyps Neg Hx   . Colon cancer Neg Hx   . Pancreatic disease Neg Hx   . Hypertension Sister   . Hypertension Brother   . Diabetes Brother    History   Social History  . Marital Status: Single    Spouse Name: N/A  . Number of Children: 0  . Years of Education: N/A   Occupational History  . retired    Social History Main Topics  . Smoking status: Never Smoker   . Smokeless tobacco: Never Used     Comment: Quit x 10 years/ never smoked on regular basis  . Alcohol Use: 1.2 oz/week    2 Shots of liquor per week     Comment: drinks on weekends, gin/vodka 1/5th.  . Drug Use: No  . Sexual Activity: No   Other Topics Concern  . None   Social History Narrative   HE DOES NOT HAVE ANY CHILDREN.     ROS:  General: Negative for anorexia, weight loss, fever, chills, fatigue, weakness. Eyes: Negative for vision changes.  ENT: Negative for hoarseness, difficulty swallowing , nasal congestion. CV: Negative for chest pain, angina, palpitations, dyspnea on exertion, peripheral edema.  Respiratory: Negative for dyspnea at rest, dyspnea  on exertion, cough, sputum, wheezing.  GI: See history of present illness. GU:  Negative for dysuria, hematuria, urinary incontinence, urinary frequency, nocturnal urination.  MS: Negative for joint pain, low back pain.  Derm: Negative for rash or itching.  Neuro: Negative for weakness, abnormal sensation, seizure, frequent headaches, memory loss, confusion.  Psych: Negative for anxiety, depression, suicidal ideation, hallucinations.  Endo: Negative for unusual weight change.  Heme: Negative for bruising or bleeding. Allergy: Negative for rash or hives.     Physical Examination:   BP 129/74 mmHg  Pulse 96  Temp(Src) 98.4 F (36.9 C) (Oral)  Ht 6\' 1"  (1.854 m)  Wt 245 lb 9.6 oz (111.403 kg)  BMI 32.41 kg/m2  Physical Examination:   General: Well-nourished, well-developed in no acute distress.  Head: Normocephalic, atraumatic.   Eyes: Conjunctiva pink,  no icterus. Mouth: Oropharyngeal mucosa moist and pink , no lesions erythema or exudate. Neck: Supple without thyromegaly, masses, or lymphadenopathy.  Lungs: Clear to auscultation bilaterally.  Heart: Regular rate and rhythm, no murmurs rubs or gallops.  Abdomen: Bowel sounds are normal, nontender, nondistended, no hepatosplenomegaly or masses, no abdominal bruits or    hernia , no rebound or guarding.   Extremities: No lower extremity edema.  Neuro: Alert and oriented x 4 , grossly normal neurologically.  Skin: Warm and dry, no rash or jaundice.   Psych: Alert and cooperative, normal mood and affect.    Labs:  Lab Results  Component Value Date   WBC 9.1 02/11/2015   HGB 12.6* 02/11/2015   HCT 38.4* 02/11/2015   MCV 88.1 02/11/2015   PLT 187 02/11/2015   Lab Results  Component Value Date   CREATININE 1.34* 02/11/2015   BUN 15 02/11/2015   NA 139 02/11/2015   K 3.5 02/11/2015   CL 104 02/11/2015   CO2 23 02/11/2015   Lab Results  Component Value Date   ALT 13* 02/11/2015   AST 24 02/11/2015   ALKPHOS 64  02/11/2015   BILITOT 1.1 02/11/2015   Chromogranin A 5 down from 24 5 months ago Imaging Studies: Dg Knee Ap/lat W/sunrise Left  01/29/2015   Radiology report  3 views LEFT knee  AP lateral and patellar sunrise views of the knee  FINDINGS: A total knee prosthesis is noted. It is in anatomic alignment.  There is no evidence of loosening.  Impression : normal TKA  xray   Dg Knee Ap/lat W/sunrise Right  01/29/2015   Radiology report  3 views right knee  AP lateral and patellar sunrise views of the knee  FINDINGS: A total knee prosthesis is noted. It is in anatomic alignment.  There is no evidence of loosening.  Impression : normal TKA  xray

## 2015-02-15 NOTE — Progress Notes (Signed)
CC'ED TO PCP 

## 2015-02-15 NOTE — Patient Instructions (Signed)
Upper endoscopy with Dr. Oneida Alar to follow up on the stomach tumor that she removed last year. See separate instructions.

## 2015-02-18 ENCOUNTER — Telehealth: Payer: Self-pay | Admitting: Orthopedic Surgery

## 2015-02-18 ENCOUNTER — Other Ambulatory Visit: Payer: Self-pay | Admitting: *Deleted

## 2015-02-18 ENCOUNTER — Encounter (HOSPITAL_COMMUNITY): Payer: Self-pay | Admitting: Hematology & Oncology

## 2015-02-18 DIAGNOSIS — G8929 Other chronic pain: Secondary | ICD-10-CM

## 2015-02-18 MED ORDER — HYDROCODONE-ACETAMINOPHEN 5-325 MG PO TABS: 1.0000 | ORAL_TABLET | ORAL | Status: DC | PRN

## 2015-02-18 NOTE — Progress Notes (Signed)
RECALL MADE IN 05/2015

## 2015-02-18 NOTE — Telephone Encounter (Signed)
Patient called for refill of medication: HYDROcodone-acetaminophen (NORCO) 5-325 MG per tablet [id number] - ph# [phone]

## 2015-02-18 NOTE — Telephone Encounter (Signed)
Prescription available, patient aware  

## 2015-02-19 NOTE — Telephone Encounter (Signed)
PATIENT PICKED UP [id number]

## 2015-03-06 NOTE — Patient Instructions (Signed)
[name]  03/06/2015     @PREFPERIOPPHARMACY @   Your procedure is scheduled on  03/12/2015  Report to [facility] at  700  A.M.  Call this number if you have problems the morning of surgery:  [phone]   Remember:  Do not eat food or drink liquids after midnight.  Take these medicines the morning of surgery with A SIP OF WATER  Norvasc, flexaril, uloric, hydrocodone, synthroid, metoprolol, flomax.   Do not wear jewelry, make-up or nail polish.  Do not wear lotions, powders, or perfumes.    Do not shave 48 hours prior to surgery.  Men may shave face and neck.  Do not bring valuables to the hospital.  [facility] is not responsible for any belongings or valuables.  Contacts, dentures or bridgework may not be worn into surgery.  Leave your suitcase in the car.  After surgery it may be brought to your room.  For patients admitted to the hospital, discharge time will be determined by your treatment team.  Patients discharged the day of surgery will not be allowed to drive home.   Name and phone number of your driver:   family Special instructions:  Follow your diet instructions from [facility].  Please read over the following fact sheets that you were given. Pain Booklet, Coughing and Deep Breathing, Surgical Site Infection Prevention, Anesthesia Post-op Instructions and Care and Recovery After Surgery      Esophagogastroduodenoscopy Esophagogastroduodenoscopy (EGD) is a procedure to examine the lining of the esophagus, stomach, and first part of the small intestine (duodenum). A long, flexible, lighted tube with a camera attached (endoscope) is inserted down the throat to view these organs. This procedure is done to detect problems or abnormalities, such as inflammation, bleeding, ulcers, or growths, in order to treat them. The procedure lasts about 5-20 minutes. It is usually an outpatient procedure, but it may need to be performed in emergency cases in the  hospital. LET YOUR CAREGIVER KNOW ABOUT:   Allergies to food or medicine.  All medicines you are taking, including vitamins, herbs, eyedrops, and over-the-counter medicines and creams.  Use of steroids (by mouth or creams).  Previous problems you or members of your family have had with the use of anesthetics.  Any blood disorders you have.  Previous surgeries you have had.  Other health problems you have.  Possibility of pregnancy, if this applies. RISKS AND COMPLICATIONS  Generally, EGD is a safe procedure. However, as with any procedure, complications can occur. Possible complications include:  Infection.  Bleeding.  Tearing (perforation) of the esophagus, stomach, or duodenum.  Difficulty breathing or not being able to breath.  Excessive sweating.  Spasms of the larynx.  Slowed heartbeat.  Low blood pressure. BEFORE THE PROCEDURE  Do not eat or drink anything for 6-8 hours before the procedure or as directed by your caregiver.  Ask your caregiver about changing or stopping your regular medicines.  If you wear dentures, be prepared to remove them before the procedure.  Arrange for someone to drive you home after the procedure. PROCEDURE   A vein will be accessed to give medicines and fluids. A medicine to relax you (sedative) and a pain reliever will be given through that access into the vein.  A numbing medicine (local anesthetic) may be sprayed on your throat for comfort and to stop you from gagging or coughing.  A mouth guard may be placed in your mouth to protect your teeth  and to keep you from biting on the endoscope.  You will be asked to lie on your left side.  The endoscope is inserted down your throat and into the esophagus, stomach, and duodenum.  Air is put through the endoscope to allow your caregiver to view the lining of your esophagus clearly.  The esophagus, stomach, and duodenum is then examined. During the exam, your caregiver  may:  Remove tissue to be examined under a microscope (biopsy) for inflammation, infection, or other medical problems.  Remove growths.  Remove objects (foreign bodies) that are stuck.  Treat any bleeding with medicines or other devices that stop tissues from bleeding (hot cautery, clipping devices).  Widen (dilate) or stretch narrowed areas of the esophagus and stomach.  The endoscope will then be withdrawn. AFTER THE PROCEDURE  You will be taken to a recovery area to be monitored. You will be able to go home once you are stable and alert.  Do not eat or drink anything until the local anesthetic and numbing medicines have worn off. You may choke.  It is normal to feel bloated, have pain with swallowing, or have a sore throat for a short time. This will wear off.  Your caregiver should be able to discuss his or her findings with you. It will take longer to discuss the test results if any biopsies were taken. Document Released: 12/18/2004 Document Revised: 01/01/2014 Document Reviewed: 07/20/2012 [facility] Patient Information 2015 [place], [place]. This information is not intended to replace advice given to you by your health care provider. Make sure you discuss any questions you have with your health care provider. PATIENT INSTRUCTIONS POST-ANESTHESIA  IMMEDIATELY FOLLOWING SURGERY:  Do not drive or operate machinery for the first twenty four hours after surgery.  Do not make any important decisions for twenty four hours after surgery or while taking narcotic pain medications or sedatives.  If you develop intractable nausea and vomiting or a severe headache please notify your doctor immediately.  FOLLOW-UP:  Please make an appointment with your surgeon as instructed. You do not need to follow up with anesthesia unless specifically instructed to do so.  WOUND CARE INSTRUCTIONS (if applicable):  Keep a dry clean dressing on the anesthesia/puncture wound site if there is drainage.  Once the  wound has quit draining you may leave it open to air.  Generally you should leave the bandage intact for twenty four hours unless there is drainage.  If the epidural site drains for more than 36-48 hours please call the anesthesia department.  QUESTIONS?:  Please feel free to call your physician or the hospital operator if you have any questions, and they will be happy to assist you.

## 2015-03-07 ENCOUNTER — Encounter (HOSPITAL_COMMUNITY)
Admission: RE | Admit: 2015-03-07 | Discharge: 2015-03-07 | Disposition: A | Discharge status: Home / Self Care | Payer: Medicare Other | Source: Ambulatory Visit | Attending: Gastroenterology | Admitting: Gastroenterology

## 2015-03-07 ENCOUNTER — Encounter (HOSPITAL_COMMUNITY): Payer: Self-pay

## 2015-03-07 DIAGNOSIS — Z01818 Encounter for other preprocedural examination: Secondary | ICD-10-CM | POA: Diagnosis not present

## 2015-03-12 ENCOUNTER — Encounter (HOSPITAL_COMMUNITY)
Admission: RE | Disposition: A | Discharge status: Home / Self Care | Payer: Self-pay | Source: Ambulatory Visit | Attending: Gastroenterology

## 2015-03-12 ENCOUNTER — Ambulatory Visit (HOSPITAL_COMMUNITY): Payer: Medicare Other | Admitting: Anesthesiology

## 2015-03-12 ENCOUNTER — Encounter (HOSPITAL_COMMUNITY): Payer: Self-pay | Admitting: *Deleted

## 2015-03-12 ENCOUNTER — Ambulatory Visit (HOSPITAL_COMMUNITY)
Admission: RE | Admit: 2015-03-12 | Discharge: 2015-03-12 | Disposition: A | Discharge status: Home / Self Care | Payer: Medicare Other | Source: Ambulatory Visit | Attending: Gastroenterology | Admitting: Gastroenterology

## 2015-03-12 DIAGNOSIS — K5730 Diverticulosis of large intestine without perforation or abscess without bleeding: Secondary | ICD-10-CM | POA: Diagnosis not present

## 2015-03-12 DIAGNOSIS — M109 Gout, unspecified: Secondary | ICD-10-CM | POA: Diagnosis not present

## 2015-03-12 DIAGNOSIS — Z09 Encounter for follow-up examination after completed treatment for conditions other than malignant neoplasm: Secondary | ICD-10-CM | POA: Diagnosis not present

## 2015-03-12 DIAGNOSIS — G8929 Other chronic pain: Secondary | ICD-10-CM | POA: Diagnosis not present

## 2015-03-12 DIAGNOSIS — K2970 Gastritis, unspecified, without bleeding: Secondary | ICD-10-CM

## 2015-03-12 DIAGNOSIS — I10 Essential (primary) hypertension: Secondary | ICD-10-CM | POA: Insufficient documentation

## 2015-03-12 DIAGNOSIS — Z8719 Personal history of other diseases of the digestive system: Secondary | ICD-10-CM | POA: Insufficient documentation

## 2015-03-12 DIAGNOSIS — Z8711 Personal history of peptic ulcer disease: Secondary | ICD-10-CM | POA: Insufficient documentation

## 2015-03-12 DIAGNOSIS — E039 Hypothyroidism, unspecified: Secondary | ICD-10-CM | POA: Diagnosis not present

## 2015-03-12 DIAGNOSIS — E538 Deficiency of other specified B group vitamins: Secondary | ICD-10-CM | POA: Insufficient documentation

## 2015-03-12 DIAGNOSIS — K2940 Chronic atrophic gastritis without bleeding: Secondary | ICD-10-CM | POA: Diagnosis not present

## 2015-03-12 DIAGNOSIS — Z886 Allergy status to analgesic agent status: Secondary | ICD-10-CM | POA: Diagnosis not present

## 2015-03-12 DIAGNOSIS — K219 Gastro-esophageal reflux disease without esophagitis: Secondary | ICD-10-CM | POA: Diagnosis not present

## 2015-03-12 DIAGNOSIS — Z79899 Other long term (current) drug therapy: Secondary | ICD-10-CM | POA: Insufficient documentation

## 2015-03-12 HISTORY — PX: BIOPSY: SHX5522

## 2015-03-12 HISTORY — PX: ESOPHAGOGASTRODUODENOSCOPY (EGD) WITH PROPOFOL: SHX5813

## 2015-03-12 SURGERY — ESOPHAGOGASTRODUODENOSCOPY (EGD) WITH PROPOFOL
Anesthesia: Monitor Anesthesia Care

## 2015-03-12 MED ORDER — PROPOFOL INFUSION 10 MG/ML OPTIME
INTRAVENOUS | Status: DC | PRN
  Administered 2015-03-12: 100 ug/kg/min via INTRAVENOUS

## 2015-03-12 MED ORDER — MIDAZOLAM HCL 2 MG/2ML IJ SOLN
INTRAMUSCULAR | Status: AC
  Filled 2015-03-12: qty 2

## 2015-03-12 MED ORDER — LIDOCAINE VISCOUS 2 % MT SOLN
15.0000 mL | Freq: Once | OROMUCOSAL | Status: AC
  Administered 2015-03-12: 15 mL via OROMUCOSAL
  Filled 2015-03-12: qty 15

## 2015-03-12 MED ORDER — FENTANYL CITRATE (PF) 100 MCG/2ML IJ SOLN: 25.0000 ug | INTRAMUSCULAR | Status: DC | PRN

## 2015-03-12 MED ORDER — ONDANSETRON HCL 4 MG/2ML IJ SOLN
4.0000 mg | Freq: Once | INTRAMUSCULAR | Status: AC
  Administered 2015-03-12: 4 mg via INTRAVENOUS
  Filled 2015-03-12: qty 2

## 2015-03-12 MED ORDER — ONDANSETRON HCL 4 MG/2ML IJ SOLN: 4.0000 mg | Freq: Once | INTRAMUSCULAR | Status: DC | PRN

## 2015-03-12 MED ORDER — STERILE WATER FOR IRRIGATION IR SOLN
Status: DC | PRN
  Administered 2015-03-12: 1000 mL

## 2015-03-12 MED ORDER — LACTATED RINGERS IV SOLN
INTRAVENOUS | Status: DC
  Administered 2015-03-12: 08:00:00 via INTRAVENOUS

## 2015-03-12 MED ORDER — FENTANYL CITRATE (PF) 100 MCG/2ML IJ SOLN
25.0000 ug | INTRAMUSCULAR | Status: AC
  Administered 2015-03-12 (×2): 25 ug via INTRAVENOUS
  Filled 2015-03-12: qty 2

## 2015-03-12 MED ORDER — PROPOFOL 10 MG/ML IV BOLUS
INTRAVENOUS | Status: AC
  Filled 2015-03-12: qty 20

## 2015-03-12 MED ORDER — MIDAZOLAM HCL 2 MG/2ML IJ SOLN
1.0000 mg | INTRAMUSCULAR | Status: DC | PRN
  Administered 2015-03-12: 2 mg via INTRAVENOUS
  Filled 2015-03-12: qty 2

## 2015-03-12 SURGICAL SUPPLY — 9 items
BLOCK BITE 60FR ADLT L/F BLUE (MISCELLANEOUS) ×2 IMPLANT
FLOOR PAD 36X40 (MISCELLANEOUS) ×4
FORCEPS BIOP RAD 4 LRG CAP 4 (CUTTING FORCEPS) ×3 IMPLANT
FORMALIN 10 PREFIL 20ML (MISCELLANEOUS) ×2 IMPLANT
KIT ENDO PROCEDURE PEN (KITS) ×4 IMPLANT
MANIFOLD NEPTUNE II (INSTRUMENTS) ×4 IMPLANT
PAD FLOOR 36X40 (MISCELLANEOUS) IMPLANT
TUBING IRRIGATION ENDOGATOR (MISCELLANEOUS) ×4 IMPLANT
WATER STERILE IRR 1000ML POUR (IV SOLUTION) ×4 IMPLANT

## 2015-03-12 NOTE — Discharge Instructions (Signed)
You have mild gastritis AND GASTRIC NODULES. I biopsied your stomach.   FOLLOW A LOW FAT DIET. SEE INFO BELOW.  YOUR BIOPSY RESULTS WILL BE AVAILABLE IN MY CHART AFTER JUL 14 and MY OFFICE WILL CONTACT YOU IN 10-14 DAYS WITH YOUR RESULTS.   FOLLOW UP IN 6 MOS.  UPPER ENDOSCOPY AFTER CARE Read the instructions outlined below and refer to this sheet in the next week. These discharge instructions provide you with general information on caring for yourself after you leave the hospital. While your treatment has been planned according to the most current medical practices available, unavoidable complications occasionally occur. If you have any problems or questions after discharge, call DR. Heman Que, [phone].  ACTIVITY  You may resume your regular activity, but move at a slower pace for the next 24 hours.   Take frequent rest periods for the next 24 hours.   Walking will help get rid of the air and reduce the bloated feeling in your belly (abdomen).   No driving for 24 hours (because of the medicine (anesthesia) used during the test).   You may shower.   Do not sign any important legal documents or operate any machinery for 24 hours (because of the anesthesia used during the test).    NUTRITION  Drink plenty of fluids.   You may resume your normal diet as instructed by your doctor.   Begin with a light meal and progress to your normal diet. Heavy or fried foods are harder to digest and may make you feel sick to your stomach (nauseated).   Avoid alcoholic beverages for 24 hours or as instructed.    MEDICATIONS  You may resume your normal medications.   WHAT YOU CAN EXPECT TODAY  Some feelings of bloating in the abdomen.   Passage of more gas than usual.    IF YOU HAD A BIOPSY TAKEN DURING THE UPPER ENDOSCOPY:  Eat a soft diet IF YOU HAVE NAUSEA, BLOATING, ABDOMINAL PAIN, OR VOMITING.    FINDING OUT THE RESULTS OF YOUR TEST Not all test results are available during  your visit. DR. Oneida Alar WILL CALL YOU WITHIN 14 DAYS OF YOUR PROCEDUE WITH YOUR RESULTS. Do not assume everything is normal if you have not heard from DR. Etienne Millward, CALL HER OFFICE AT [phone].  SEEK IMMEDIATE MEDICAL ATTENTION AND CALL THE OFFICE: [phone] IF:  You have more than a spotting of blood in your stool.   Your belly is swollen (abdominal distention).   You are nauseated or vomiting.   You have a temperature over 101F.   You have abdominal pain or discomfort that is severe or gets worse throughout the day.     Low-Fat Diet  BREADS, CEREALS, PASTA, RICE, DRIED PEAS, AND BEANS These products are high in carbohydrates and most are low in fat. Therefore, they can be increased in the diet as substitutes for fatty foods. They too, however, contain calories and should not be eaten in excess. Cereals can be eaten for snacks as well as for breakfast.  Include foods that contain fiber (fruits, vegetables, whole grains, and legumes). Research shows that fiber may lower blood cholesterol levels, especially the water-soluble fiber found in fruits, vegetables, oat products, and legumes.  FRUITS AND VEGETABLES It is good to eat fruits and vegetables. Besides being sources of fiber, both are rich in vitamins and some minerals. They help you get the daily allowances of these nutrients. Fruits and vegetables can be used for snacks and desserts.  MEATS Limit  lean meat, chicken, [place], and fish to no more than 6 ounces per day.  Beef, Pork, and Lamb Use lean cuts of beef, pork, and lamb. Lean cuts include:  Extra-lean ground beef.  Arm roast.  Sirloin tip.  Center-cut ham.  Round steak.  Loin chops.  Rump roast.  Tenderloin.  Trim all fat off the outside of meats before cooking. It is not necessary to severely decrease the intake of red meat, but lean choices should be made. Lean meat is rich in protein and contains a highly absorbable form of iron. Premenopausal women, in  particular, should avoid reducing lean red meat because this could increase the risk for low red blood cells (iron-deficiency anemia).  Chicken and [place] These are good sources of protein. The fat of poultry can be reduced by removing the skin and underlying fat layers before cooking. Chicken and [place] can be substituted for lean red meat in the diet. Poultry should not be fried or covered with high-fat sauces.  Fish and Shellfish Fish is a good source of protein. Shellfish contain cholesterol, but they usually are low in saturated fatty acids. The preparation of fish is important. Like chicken and [place], they should not be fried or covered with high-fat sauces.  EGGS Egg whites contain no fat or cholesterol. They can be eaten often. Try 1 to 2 egg whites instead of whole eggs in recipes or use egg substitutes that do not contain yolk.  MILK AND DAIRY PRODUCTS Use skim or 1% milk instead of 2% or whole milk. Decrease whole milk, natural, and processed cheeses. Use nonfat or low-fat (2%) cottage cheese or low-fat cheeses made from vegetable oils. Choose nonfat or low-fat (1 to 2%) yogurt. Experiment with evaporated skim milk in recipes that call for heavy cream. Substitute low-fat yogurt or low-fat cottage cheese for sour cream in dips and salad dressings. Have at least 2 servings of low-fat dairy products, such as 2 glasses of skim (or 1%) milk each day to help get your daily calcium intake.  FATS AND OILS Reduce the total intake of fats, especially saturated fat. Butterfat, lard, and beef fats are high in saturated fat and cholesterol. These should be avoided as much as possible. Vegetable fats do not contain cholesterol, but certain vegetable fats, such as coconut oil, palm oil, and palm kernel oil are very high in saturated fats. These should be limited. These fats are often used in bakery goods, processed foods, popcorn, oils, and nondairy creamers. Vegetable shortenings and some peanut butters  contain hydrogenated oils, which are also saturated fats. Read the labels on these foods and check for saturated vegetable oils.  Unsaturated vegetable oils and fats do not raise blood cholesterol. However, they should be limited because they are fats and are high in calories. Total fat should still be limited to 30% of your daily caloric intake. Desirable liquid vegetable oils are corn oil, cottonseed oil, olive oil, canola oil, safflower oil, soybean oil, and sunflower oil. Peanut oil is not as good, but small amounts are acceptable. Buy a heart-healthy tub margarine that has no partially hydrogenated oils in the ingredients. Mayonnaise and salad dressings often are made from unsaturated fats, but they should also be limited because of their high calorie and fat content. Seeds, nuts, peanut butter, olives, and avocados are high in fat, but the fat is mainly the unsaturated type. These foods should be limited mainly to avoid excess calories and fat.  OTHER EATING TIPS Snacks  Most sweets should  be limited as snacks. They tend to be rich in calories and fats, and their caloric content outweighs their nutritional value. Some good choices in snacks are graham crackers, melba toast, soda crackers, bagels (no egg), English muffins, fruits, and vegetables. These snacks are preferable to snack crackers, [place] fries, and chips. Popcorn should be air-popped or cooked in small amounts of liquid vegetable oil.  Desserts Eat fruit, low-fat yogurt, and fruit ices instead of pastries, cake, and cookies. Sherbet, angel food cake, gelatin dessert, frozen low-fat yogurt, or other frozen products that do not contain saturated fat (pure fruit juice bars, frozen ice pops) are also acceptable.   COOKING METHODS Choose those methods that use little or no fat. They include: Poaching.  Braising.  Steaming.  Grilling.  Baking.  Stir-frying.  Broiling.  Microwaving.  Foods can be cooked in a nonstick pan without added  fat, or use a nonfat cooking spray in regular cookware. Limit fried foods and avoid frying in saturated fat. Add moisture to lean meats by using water, broth, cooking wines, and other nonfat or low-fat sauces along with the cooking methods mentioned above. Soups and stews should be chilled after cooking. The fat that forms on top after a few hours in the refrigerator should be skimmed off. When preparing meals, avoid using excess salt. Salt can contribute to raising blood pressure in some people.  EATING AWAY FROM HOME Order entres, potatoes, and vegetables without sauces or butter. When meat exceeds the size of a deck of cards (3 to 4 ounces), the rest can be taken home for another meal. Choose vegetable or fruit salads and ask for low-calorie salad dressings to be served on the side. Use dressings sparingly. Limit high-fat toppings, such as bacon, crumbled eggs, cheese, sunflower seeds, and olives. Ask for heart-healthy tub margarine instead of butter.

## 2015-03-12 NOTE — Progress Notes (Signed)
REVIEWED-NO ADDITIONAL RECOMMENDATIONS. 

## 2015-03-12 NOTE — Anesthesia Preprocedure Evaluation (Addendum)
Anesthesia Evaluation  Patient identified by MRN, date of birth, ID band Patient awake    Reviewed: Allergy & Precautions, H&P , NPO status , Patient's Chart, lab work & pertinent test results, reviewed documented beta blocker date and time   Airway Mallampati: II  TM Distance: >3 FB     Dental  (+) Teeth Intact, Missing   Pulmonary former smoker,  breath sounds clear to auscultation        Cardiovascular hypertension, Pt. on medications and Pt. on home beta blockers Rhythm:Regular Rate:Normal     Neuro/Psych    GI/Hepatic PUD, GERD-  ,(+)     substance abuse  alcohol use,   Endo/Other  Hypothyroidism Hx pancreatitis  Renal/GU      Musculoskeletal  (+) Arthritis -,   Abdominal   Peds  Hematology  (+) anemia ,   Anesthesia Other Findings   Reproductive/Obstetrics                             Anesthesia Physical Anesthesia Plan  ASA: III  Anesthesia Plan: MAC   Post-op Pain Management:    Induction: Intravenous  Airway Management Planned: Simple Face Mask  Additional Equipment:   Intra-op Plan:   Post-operative Plan:   Informed Consent: I have reviewed the patients History and Physical, chart, labs and discussed the procedure including the risks, benefits and alternatives for the proposed anesthesia with the patient or authorized representative who has indicated his/her understanding and acceptance.     Plan Discussed with:   Anesthesia Plan Comments:         Anesthesia Quick Evaluation

## 2015-03-12 NOTE — Transfer of Care (Signed)
Immediate Anesthesia Transfer of Care Note  Patient: [name]  Procedure(s) Performed: Procedure(s): ESOPHAGOGASTRODUODENOSCOPY (EGD) WITH PROPOFOL (N/A) BIOPSY  Patient Location: PACU  Anesthesia Type:MAC  Level of Consciousness: awake and patient cooperative  Airway & Oxygen Therapy: Patient Spontanous Breathing and Patient connected to face mask oxygen  Post-op Assessment: Report given to RN, Post -op Vital signs reviewed and stable and Patient moving all extremities  Post vital signs: Reviewed and stable  Last Vitals:  Filed Vitals:   03/12/15 0750  BP: 123/83  Pulse:   Temp:   Resp: 13    Complications: No apparent anesthesia complications

## 2015-03-12 NOTE — H&P (Addendum)
Primary Care Physician:  [name], MD Primary Gastroenterologist:  Dr. Oneida Alar  Pre-Procedure History & Physical: HPI:  [name] is a 64 y.o. male here for SCREENING for GASTRIC carcinoid.   Past Medical History  Diagnosis Date  . Diverticulosis 2008 LGIB  . Gout   . HTN (hypertension)   . History of septic arthritis   . History of alcohol abuse   . Anemia FeDA: GASTRIC POLYPS, B12    TCS 2008 EGD 2009, 2008-HB 11.1 MCV 83.6 CR 1.22, 2009 FERRITIN 102-22  . Hepatomegaly 2o to FATTY LIVER DZ  . Chronic knee pain   . B12 deficiency   . Hypothyroidism   . DIVERTICULOSIS, COLON 08/15/2006    Qualifier: Diagnosis of  By: Jonna Munro MD, Roderic Scarce    . PUD 11/30/2008    Qualifier: History of  By: Jonna Munro MD, Roderic Scarce    . Carcinoid tumor determined by biopsy of stomach     Past Surgical History  Procedure Laterality Date  . Upper gastrointestinal endoscopy  APR 2009    INFLAMED HYPERPLASTIC POLYPS, CHRONIC GASTRITIS  . Colonoscopy  2008 [facility] DJ    LGIB 2o to TICS, prep good  . Replacement total knee bilateral    . Hand surgery    . Cholecystectomy    . Esophagogastroduodenoscopy  12/08/2007    [id number] gastric polyps seen in the cardia and body of the stomach/Normal esophagus without evidence of Barrett's, mass, erosion/Normal duodenal bulb and second portion of the duodenum. Benign bx.  . Colonoscopy with propofol N/A 08/14/2014    SLF: 1. Four large colon polyps removed. 2. The left colon is redundant 3. Moderate diverticulosis throughout teh entire examined colon  . Esophagogastroduodenoscopy (egd) with propofol N/A 08/14/2014    SLF: 1. Heme postive stools due to gastric and colon polyps 2. Multiple gastric   . Polypectomy  08/14/2014    Procedure: POLYPECTOMY;  Surgeon: Danie Binder, MD;  Location: [facility];  Service: Endoscopy;;  . Esophageal biopsy  08/14/2014    Procedure: BIOPSY;  Surgeon: Danie Binder, MD;  Location: [facility];  Service: Endoscopy;;  .  Colonoscopy with propofol N/A 11/06/2014    SLF: 8 small polyps removed. One large pedunculated polyp removed from the ascending colon, tubulovillous and tubular adenomas. Next colonoscopy March 2019  . Polypectomy N/A 11/06/2014    Procedure: POLYPECTOMY;  Surgeon: Danie Binder, MD;  Location: [facility];  Service: Endoscopy;  Laterality: N/A;  Transverse Colon x3, Ascending Colon x2, Descending Colon x3    Prior to Admission medications   Medication Sig Start Date End Date Taking? Authorizing Provider  acetaminophen (TYLENOL) 500 MG tablet Take 500 mg by mouth every 6 (six) hours as needed for moderate pain.   Yes Historical Provider, MD  amLODipine (NORVASC) 10 MG tablet Take 10 mg by mouth daily.   Yes Historical Provider, MD  cyanocobalamin (,VITAMIN B-12,) 1000 MCG/ML injection Inject 1,000 mcg into the muscle every 30 (thirty) days.     Yes Historical Provider, MD  cyclobenzaprine (FLEXERIL) 10 MG tablet Take 1 tablet (10 mg total) by mouth 3 (three) times daily as needed for muscle spasms. 02/05/15  Yes Orpah Greek, MD  febuxostat (ULORIC) 40 MG tablet Take 40 mg by mouth daily.    Yes Historical Provider, MD  HYDROcodone-acetaminophen (NORCO) 5-325 MG per tablet Take 1 tablet by mouth every 4 (four) hours as needed. 02/18/15  Yes Carole Civil, MD  levothyroxine (SYNTHROID, LEVOTHROID) 175 MCG tablet Take 175  mcg by mouth daily before breakfast.  05/02/14  Yes Historical Provider, MD  metoprolol succinate (TOPROL-XL) 25 MG 24 hr tablet Take 25 mg by mouth 2 (two) times daily.    Yes Historical Provider, MD  tamsulosin (FLOMAX) 0.4 MG CAPS capsule Take 0.4 mg by mouth daily.   Yes Historical Provider, MD    Allergies as of 02/15/2015 - Review Complete 02/15/2015  Allergen Reaction Noted  . Aspirin  06/01/2007    Family History  Problem Relation Age of Onset  . Colon polyps Neg Hx   . Colon cancer Neg Hx   . Pancreatic disease Neg Hx   . Hypertension Sister   . Hypertension  Brother   . Diabetes Brother     History   Social History  . Marital Status: Single    Spouse Name: N/A  . Number of Children: 0  . Years of Education: N/A   Occupational History  . retired    Social History Main Topics  . Smoking status: Never Smoker   . Smokeless tobacco: Never Used     Comment: Quit x 10 years/ never smoked on regular basis  . Alcohol Use: 1.2 oz/week    2 Shots of liquor per week     Comment: drinks on weekends, gin/vodka 1/5th.  . Drug Use: No  . Sexual Activity: No   Other Topics Concern  . Not on file   Social History Narrative   HE DOES NOT HAVE ANY CHILDREN.    Review of Systems: See HPI, otherwise negative ROS   Physical Exam: There were no vitals taken for this visit. General:   Alert,  pleasant and cooperative in NAD Head:  Normocephalic and atraumatic. Neck:  Supple; Lungs:  Clear throughout to auscultation.    Heart:  Regular rate and rhythm. Abdomen:  Soft, nontender and nondistended. Normal bowel sounds, without guarding, and without rebound.   Neurologic:  Alert and  oriented x4;  grossly normal neurologically.  Impression/Plan:     SCREENING for GASTRIC carcinoid  Plan:  1. EGD TODAY

## 2015-03-12 NOTE — Op Note (Signed)
[facility] [address] [place], [address]   ENDOSCOPY PROCEDURE REPORT  PATIENT: [name], [name]  MR#: [id number] BIRTHDATE: 06-23-1951 , 60  yrs. old GENDER: male  ENDOSCOPIST: Danie Binder, MD REFERRED JL:[id number] Whitney Muse, MD Marjean Donna, M.D.  PROCEDURE DATE: 04/06/2015 PROCEDURE:   EGD w/ biopsy  INDICATIONS:SCREEN FOR GASTRIC CARCINOID. PT DENIES NAUSEA, VOMITING, ABD PAIN OR PROBLEMS SWALLOWING. MEDICATIONS: Monitored anesthesia care TOPICAL ANESTHETIC:   Viscous Xylocaine ASA CLASS:  DESCRIPTION OF PROCEDURE:     Physical exam was performed.  Informed consent was obtained from the patient after explaining the benefits, risks, and alternatives to the procedure.  The patient was connected to the monitor and placed in the left lateral position.  Continuous oxygen was provided by nasal cannula and IV medicine administered through an indwelling cannula.  After administration of sedation, the patients esophagus was intubated and the     endoscope was advanced under direct visualization to the second portion of the duodenum.  The scope was removed slowly by carefully examining the color, texture, anatomy, and integrity of the mucosa on the way out.  The patient was recovered in endoscopy and discharged home in satisfactory condition.  Estimated blood loss is zero unless otherwise noted in this procedure report.     ESOPHAGUS: The mucosa of the esophagus appeared normal.   STOMACH: MULTIPLE GASTRIC NODULES(3-6 MM) SEEN IN GASTRIC BODY/ANTRUM.  COLD FORCEPS BIOPSIES OBTAINED.  PRIOR AREA THAT WAS TATTOOED HAD 3 MM NODULE ON THE PERIPHERY.  COLD FORCEPS BIOPSIES OBTAINED IN TATOOED AREA AND OF GASTRIC NODULE.   Non-erosive gastritis (inflammation) was found in the gastric antrum.  Multiple biopsies were performed using cold forceps.   DUODENUM: The duodenal mucosa showed no abnormalities in the bulb and 2nd part of the duodenum. COMPLICATIONS: There  were no immediate complications.  ENDOSCOPIC IMPRESSION: 1.   MULTIPLE GASTRIC NODULES SEEN IN GASTRIC BODY/ANTRUM. 2.   Non-erosive gastritis (inflammation) was found in the gastric antrum  RECOMMENDATIONS: FOLLOW A LOW FAT DIET. AWAIT BIOPSY RESULTS. FOLLOW UP IN 6 MOS.  REPEAT EXAM:  eSigned:  Danie Binder, MD 2015/04/06 8:55 AM  CPT CODES: ICD CODES:  The ICD and CPT codes recommended by this software are interpretations from the data that the clinical staff has captured with the software.  The verification of the translation of this report to the ICD and CPT codes and modifiers is the sole responsibility of the health care institution and practicing physician where this report was generated.  [employer] will not be held responsible for the validity of the ICD and CPT codes included on this report.  AMA assumes no liability for data contained or not contained herein. CPT is a Designer, television/film set of the [employer].

## 2015-03-12 NOTE — Anesthesia Postprocedure Evaluation (Signed)
  Anesthesia Post-op Note  Patient: [name]  Procedure(s) Performed: Procedure(s): ESOPHAGOGASTRODUODENOSCOPY (EGD) WITH PROPOFOL (N/A) BIOPSY  Patient Location: PACU  Anesthesia Type:MAC  Level of Consciousness: awake, alert , oriented and patient cooperative  Airway and Oxygen Therapy: Patient Spontanous Breathing  Post-op Pain: none  Post-op Assessment: Post-op Vital signs reviewed, Patient's Cardiovascular Status Stable, Respiratory Function Stable, Patent Airway and Pain level controlled              Post-op Vital Signs: Reviewed and stable  Last Vitals:  Filed Vitals:   03/12/15 0750  BP: 123/83  Pulse:   Temp:   Resp: 13    Complications: No apparent anesthesia complications

## 2015-03-13 ENCOUNTER — Encounter (HOSPITAL_COMMUNITY): Payer: Self-pay | Admitting: Gastroenterology

## 2015-03-14 ENCOUNTER — Telehealth: Payer: Self-pay | Admitting: Gastroenterology

## 2015-03-14 NOTE — Telephone Encounter (Signed)
PLEASE CALL PT. HIS BIOPSIES DO NOT SHOW ANY EVIDENCE FOR TUMOR IN HIS STOMACH. WE WILL FORWARD RESULTS TO DR. Whitney Muse. REPEAT EGD IN 6-12 MOS.

## 2015-03-14 NOTE — Telephone Encounter (Signed)
REMINDER IN EPIC °

## 2015-03-15 NOTE — Telephone Encounter (Signed)
PT is aware of results.  

## 2015-03-25 ENCOUNTER — Telehealth: Payer: Self-pay | Admitting: Orthopedic Surgery

## 2015-03-25 NOTE — Telephone Encounter (Signed)
Patient called to request refill on: HYDROcodone-acetaminophen ([facility]) 5-325 MG per tablet [id number]  - patient ph# [phone]

## 2015-04-01 ENCOUNTER — Other Ambulatory Visit: Payer: Self-pay | Admitting: *Deleted

## 2015-04-01 DIAGNOSIS — G8929 Other chronic pain: Secondary | ICD-10-CM

## 2015-04-01 MED ORDER — HYDROCODONE-ACETAMINOPHEN 5-325 MG PO TABS: 1.0000 | ORAL_TABLET | ORAL | Status: DC | PRN

## 2015-04-02 NOTE — Telephone Encounter (Signed)
Patient picked up Rx

## 2015-04-02 NOTE — Telephone Encounter (Signed)
Prescription available, patient aware  

## 2015-04-12 ENCOUNTER — Ambulatory Visit (HOSPITAL_COMMUNITY): Payer: Medicare Other | Admitting: Oncology

## 2015-04-12 ENCOUNTER — Other Ambulatory Visit (HOSPITAL_COMMUNITY): Payer: Medicare Other

## 2015-04-19 ENCOUNTER — Encounter (HOSPITAL_COMMUNITY): Payer: Self-pay

## 2015-05-07 ENCOUNTER — Telehealth: Payer: Self-pay | Admitting: Orthopedic Surgery

## 2015-05-07 ENCOUNTER — Other Ambulatory Visit: Payer: Self-pay | Admitting: *Deleted

## 2015-05-07 DIAGNOSIS — G8929 Other chronic pain: Secondary | ICD-10-CM

## 2015-05-07 MED ORDER — HYDROCODONE-ACETAMINOPHEN 5-325 MG PO TABS: 1.0000 | ORAL_TABLET | ORAL | Status: DC | PRN

## 2015-05-07 NOTE — Telephone Encounter (Signed)
[name] is calling requesting a refill on medication HYDROcodone-acetaminophen (NORCO) 5-325 MG per tablet please advise?

## 2015-05-07 NOTE — Telephone Encounter (Signed)
Called and notified patient prescription is ready for pick-up.

## 2015-05-08 NOTE — Telephone Encounter (Signed)
Patient picked up prescription.

## 2015-05-09 ENCOUNTER — Encounter (HOSPITAL_COMMUNITY): Payer: Medicare Other

## 2015-05-09 ENCOUNTER — Encounter (HOSPITAL_COMMUNITY): Payer: Self-pay | Admitting: Oncology

## 2015-05-09 ENCOUNTER — Encounter (HOSPITAL_COMMUNITY): Payer: Medicare Other | Attending: Hematology & Oncology | Admitting: Oncology

## 2015-05-09 VITALS — BP 131/97 | HR 86 | Temp 98.3°F | Resp 20 | Wt 253.4 lb

## 2015-05-09 DIAGNOSIS — D3A8 Other benign neuroendocrine tumors: Secondary | ICD-10-CM

## 2015-05-09 DIAGNOSIS — D489 Neoplasm of uncertain behavior, unspecified: Secondary | ICD-10-CM | POA: Diagnosis not present

## 2015-05-09 DIAGNOSIS — D6489 Other specified anemias: Secondary | ICD-10-CM | POA: Insufficient documentation

## 2015-05-09 LAB — CBC WITH DIFFERENTIAL/PLATELET
Basophils Absolute: 0 10*3/uL (ref 0.0–0.1)
Basophils Relative: 0 % (ref 0–1)
EOS PCT: 4 % (ref 0–5)
Eosinophils Absolute: 0.3 10*3/uL (ref 0.0–0.7)
HCT: 40.7 % (ref 39.0–52.0)
Hemoglobin: 13.2 g/dL (ref 13.0–17.0)
LYMPHS ABS: 2.5 10*3/uL (ref 0.7–4.0)
Lymphocytes Relative: 35 % (ref 12–46)
MCH: 28 pg (ref 26.0–34.0)
MCHC: 32.4 g/dL (ref 30.0–36.0)
MCV: 86.2 fL (ref 78.0–100.0)
MONOS PCT: 9 % (ref 3–12)
Monocytes Absolute: 0.7 10*3/uL (ref 0.1–1.0)
Neutro Abs: 3.6 10*3/uL (ref 1.7–7.7)
Neutrophils Relative %: 52 % (ref 43–77)
PLATELETS: 159 10*3/uL (ref 150–400)
RBC: 4.72 MIL/uL (ref 4.22–5.81)
RDW: 14.8 % (ref 11.5–15.5)
WBC: 7.1 10*3/uL (ref 4.0–10.5)

## 2015-05-09 LAB — FOLATE: Folate: 11.1 ng/mL (ref >5.9)

## 2015-05-09 LAB — VITAMIN B12: Vitamin B-12: 1044 pg/mL — ABNORMAL HIGH (ref 180–914)

## 2015-05-09 LAB — FERRITIN: Ferritin: 16 ng/mL — ABNORMAL LOW (ref 24–336)

## 2015-05-09 NOTE — Patient Instructions (Signed)
[place] at [facility] Discharge Instructions  RECOMMENDATIONS MADE BY THE CONSULTANT AND ANY TEST RESULTS WILL BE SENT TO YOUR REFERRING PHYSICIAN.  Exam done and seen by the PA Return in 30months for labs and followup  Thank you for choosing [place] at [facility] to provide your oncology and hematology care.  To afford each patient quality time with our provider, please arrive at least 15 minutes before your scheduled appointment time.    You need to re-schedule your appointment should you arrive 10 or more minutes late.  We strive to give you quality time with our providers, and arriving late affects you and other patients whose appointments are after yours.  Also, if you no show three or more times for appointments you may be dismissed from the clinic at the providers discretion.     Again, thank you for choosing [facility].  Our hope is that these requests will decrease the amount of time that you wait before being seen by our physicians.       _____________________________________________________________  Should you have questions after your visit to [facility], please contact our office at [phone] between the hours of 8:30 a.m. and 4:30 p.m.  Voicemails left after 4:30 p.m. will not be returned until the following business day.  For prescription refill requests, have your pharmacy contact our office.

## 2015-05-09 NOTE — Assessment & Plan Note (Addendum)
Pleasant 64 year old male with a history of alcohol abuse, fatty liver, he underwent upper endoscopy with Dr. Oneida Alar. He had a gastric polyp that was removed that showed pathology consistent with a low-grade neuroendocrine tumor. In addition he had a biopsy of the stomach that showed focal involvement by low-grade neuroendocrine tumor. Based upon his pathology, there is concern of residual disease or more extensive disease within his stomach.  Octreotide scan on 09/19/2014 was negative for any avid metastasis.  His case was presented to GI tumor board to help further solidify recommendations.     He is S/P EGD and biopsy on 03/12/2015 by Dr. Oneida Alar.  Pathology is negative for malignancy.  He will need another surveillance EGD in 6 months.  Labs today: CBC diff, ferritin, Folate, B12,  chromogranin A.  Labs in 4 months: CBC diff, CMET, serum serotonin, chromogranin A.  Return in 4 months for follow-up.

## 2015-05-09 NOTE — Progress Notes (Signed)
[name], MD [place] [address]  Neuroendocrine tumor - Plan: CBC with Differential, Chromogranin A, doxycycline (VIBRA-TABS) 100 MG tablet, omeprazole (PRILOSEC) 20 MG capsule, Comprehensive metabolic panel, Serotonin serum, CANCELED: Serotonin serum  CURRENT THERAPY: Observation  INTERVAL HISTORY: Dallas Tetteh 64 y.o. male returns for followup of carcinoid tumor found on EGD.   I personally reviewed and went over pathology results with the patient.  Mr. Riehm underwent an EGD by Dr. Oneida Alar on 03/12/2015 with biopsy.  Results are negative for malignancy.  He is asymptomatic without any nausea/vomiting, abdominal pain, decreased appetite, flushing, heart palpations, constipation, diarrhea.  His appetite is strong and no weight loss identifed.   Past Medical History  Diagnosis Date  . Diverticulosis 2008 LGIB  . Gout   . HTN (hypertension)   . History of septic arthritis   . History of alcohol abuse   . Anemia FeDA: GASTRIC POLYPS, B12    TCS 2008 EGD 2009, 2008-HB 11.1 MCV 83.6 CR 1.22, 2009 FERRITIN 102-22  . Hepatomegaly 2o to FATTY LIVER DZ  . Chronic knee pain   . B12 deficiency   . Hypothyroidism   . DIVERTICULOSIS, COLON 08/15/2006    Qualifier: Diagnosis of  By: Jonna Munro MD, Roderic Scarce    . PUD 11/30/2008    Qualifier: History of  By: Jonna Munro MD, Roderic Scarce    . Carcinoid tumor determined by biopsy of stomach     has LIPOMA NOS; B12 DEFICIENCY; GOUT; ANEMIA, IRON DEFICIENCY; ABUSE, ALCOHOL, UNSPECIFIED; HYPERTENSION; GERD; IBS; Fatty liver; HYPRTRPHY PROSTATE BNG W/O URINARY OBST/LUTS; ABSCESS, GLUTEAL; OSTEOARTHRITIS; KNEE, ARTHRITIS, DEGEN./OSTEO; DEGENERATION, DISC NOS; TOTAL KNEE FOLLOW-UP; Thrombocytopenia, unspecified; Elevated LFTs; Neuroendocrine tumor; and Tubulovillous adenoma of large intestine on his problem list.     is allergic to aspirin.  Mr. Barona does not currently have medications on file.  Past Surgical History    Procedure Laterality Date  . Upper gastrointestinal endoscopy  APR 2009    INFLAMED HYPERPLASTIC POLYPS, CHRONIC GASTRITIS  . Colonoscopy  2008 [facility] DJ    LGIB 2o to TICS, prep good  . Replacement total knee bilateral    . Hand surgery    . Cholecystectomy    . Esophagogastroduodenoscopy  12/08/2007    [id number] gastric polyps seen in the cardia and body of the stomach/Normal esophagus without evidence of Barrett's, mass, erosion/Normal duodenal bulb and second portion of the duodenum. Benign bx.  . Colonoscopy with propofol N/A 08/14/2014    SLF: 1. Four large colon polyps removed. 2. The left colon is redundant 3. Moderate diverticulosis throughout teh entire examined colon  . Esophagogastroduodenoscopy (egd) with propofol N/A 08/14/2014    SLF: 1. Heme postive stools due to gastric and colon polyps 2. Multiple gastric   . Polypectomy  08/14/2014    Procedure: POLYPECTOMY;  Surgeon: Danie Binder, MD;  Location: [facility];  Service: Endoscopy;;  . Esophageal biopsy  08/14/2014    Procedure: BIOPSY;  Surgeon: Danie Binder, MD;  Location: [facility];  Service: Endoscopy;;  . Colonoscopy with propofol N/A 11/06/2014    SLF: 8 small polyps removed. One large pedunculated polyp removed from the ascending colon, tubulovillous and tubular adenomas. Next colonoscopy March 2019  . Polypectomy N/A 11/06/2014    Procedure: POLYPECTOMY;  Surgeon: Danie Binder, MD;  Location: [facility];  Service: Endoscopy;  Laterality: N/A;  Transverse Colon x3, Ascending Colon x2, Descending Colon x3  . Esophagogastroduodenoscopy (egd) with propofol N/A 03/12/2015  Procedure: ESOPHAGOGASTRODUODENOSCOPY (EGD) WITH PROPOFOL;  Surgeon: Danie Binder, MD;  Location: [facility];  Service: Endoscopy;  Laterality: N/A;  . Esophageal biopsy  03/12/2015    Procedure: BIOPSY;  Surgeon: Danie Binder, MD;  Location: [facility];  Service: Endoscopy;;    Denies any headaches, dizziness, double vision, fevers, chills, night sweats, nausea,  vomiting, diarrhea, constipation, chest pain, heart palpitations, shortness of breath, blood in stool, black tarry stool, urinary pain, urinary burning, urinary frequency, hematuria.   PHYSICAL EXAMINATION  ECOG PERFORMANCE STATUS: 0 - Asymptomatic  Filed Vitals:   05/09/15 1036  BP: 131/97  Pulse: 86  Temp: 98.3 F (36.8 C)  Resp: 20    GENERAL:alert, no distress, well nourished, well developed, comfortable, cooperative and smiling, unaccompanied SKIN: skin color, texture, turgor are normal, no rashes or significant lesions HEAD: Normocephalic, No masses, lesions, tenderness or abnormalities EYES: normal, PERRLA, EOMI, Conjunctiva are pink and non-injected EARS: External ears normal OROPHARYNX:lips, buccal mucosa, and tongue normal and mucous membranes are moist  NECK: supple, no adenopathy, thyroid normal size, non-tender, without nodularity, no stridor, non-tender, trachea midline LYMPH:  no palpable lymphadenopathy BREAST:not examined LUNGS: clear to auscultation  HEART: regular rate & rhythm ABDOMEN:abdomen soft, non-tender and normal bowel sounds BACK: Back symmetric, no curvature. EXTREMITIES:less then 2 second capillary refill, no joint deformities, effusion, or inflammation, no skin discoloration, no cyanosis  NEURO: alert & oriented x 3 with fluent speech, no focal motor/sensory deficits, gait normal   LABORATORY DATA: CBC    Component Value Date/Time   WBC 7.1 05/09/2015 0951   RBC 4.72 05/09/2015 0951   RBC 3.20* 03/17/2007 0405   HGB 13.2 05/09/2015 0951   HCT 40.7 05/09/2015 0951   HCT 44 01/05/2013   PLT 159 05/09/2015 0951   PLT 148 01/05/2013   MCV 86.2 05/09/2015 0951   MCV 90.4 01/05/2013   MCH 28.0 05/09/2015 0951   MCHC 32.4 05/09/2015 0951   RDW 14.8 05/09/2015 0951   LYMPHSABS 2.5 05/09/2015 0951   MONOABS 0.7 05/09/2015 0951   EOSABS 0.3 05/09/2015 0951   BASOSABS 0.0 05/09/2015 0951      Chemistry      Component Value Date/Time   NA  139 02/11/2015 0944   K 3.5 02/11/2015 0944   CL 104 02/11/2015 0944   CO2 23 02/11/2015 0944   BUN 15 02/11/2015 0944   CREATININE 1.34* 02/11/2015 0944   CREATININE 1.30 02/24/2012 1023      Component Value Date/Time   CALCIUM 9.0 02/11/2015 0944   ALKPHOS 64 02/11/2015 0944   AST 24 02/11/2015 0944   ALT 13* 02/11/2015 0944   BILITOT 1.1 02/11/2015 0944      PATHOLOGY:   03/12/2015  Stomach, biopsy - EROSIVE CHRONIC ACTIVE ATROPHIC GASTRITIS WITH INTESTINAL METAPLASIA (ANTRAL MUCOSA). - NEGATIVE FOR DYSPLASIA OR MALIGNANCY. - WARTHIN-STARRY STAIN NEGATIVE FOR H. PYLORI.   RADIOGRAPHIC STUDIES:  No results found.   ASSESSMENT AND PLAN:  Neuroendocrine tumor Pleasant 64 year old male with a history of alcohol abuse, fatty liver, he underwent upper endoscopy with Dr. Oneida Alar. He had a gastric polyp that was removed that showed pathology consistent with a low-grade neuroendocrine tumor. In addition he had a biopsy of the stomach that showed focal involvement by low-grade neuroendocrine tumor. Based upon his pathology, there is concern of residual disease or more extensive disease within his stomach.  Octreotide scan on 09/19/2014 was negative for any avid metastasis.  His case was presented to GI tumor board to  help further solidify recommendations.     He is S/P EGD and biopsy on 03/12/2015 by Dr. Oneida Alar.  Pathology is negative for malignancy.  He will need another surveillance EGD in 6 months.  Labs today: CBC diff, ferritin, Folate, B12,  chromogranin A.  Labs in 4 months: CBC diff, CMET, serum serotonin, chromogranin A.  Return in 4 months for follow-up.    THERAPY PLAN:  Continue surveillance with EGDs and labs.  All questions were answered. The patient knows to call the clinic with any problems, questions or concerns. We can certainly see the patient much sooner if necessary.  Patient and plan discussed with Dr. Ancil Linsey and she is in agreement with the  aforementioned.   Amario Longmore 05/09/2015

## 2015-05-10 LAB — CHROMOGRANIN A: Chromogranin A: 5 nmol/L (ref 0–5)

## 2015-06-10 ENCOUNTER — Other Ambulatory Visit: Payer: Self-pay | Admitting: *Deleted

## 2015-06-10 ENCOUNTER — Telehealth: Payer: Self-pay | Admitting: Orthopedic Surgery

## 2015-06-10 DIAGNOSIS — G8929 Other chronic pain: Secondary | ICD-10-CM

## 2015-06-10 MED ORDER — HYDROCODONE-ACETAMINOPHEN 5-325 MG PO TABS: 1.0000 | ORAL_TABLET | ORAL | Status: DC | PRN

## 2015-06-10 NOTE — Telephone Encounter (Signed)
Prescription available, patient aware  

## 2015-06-11 NOTE — Telephone Encounter (Signed)
Patient picked up Rx

## 2015-07-15 ENCOUNTER — Telehealth: Payer: Self-pay | Admitting: *Deleted

## 2015-07-15 ENCOUNTER — Other Ambulatory Visit: Payer: Self-pay | Admitting: *Deleted

## 2015-07-15 DIAGNOSIS — G8929 Other chronic pain: Secondary | ICD-10-CM

## 2015-07-15 MED ORDER — HYDROCODONE-ACETAMINOPHEN 5-325 MG PO TABS: 1.0000 | ORAL_TABLET | ORAL | Status: DC | PRN

## 2015-07-15 NOTE — Telephone Encounter (Signed)
Prescription available, patient aware  

## 2015-07-15 NOTE — Telephone Encounter (Signed)
Patient called requesting hydrocodone to be refilled. Please advise

## 2015-08-11 ENCOUNTER — Other Ambulatory Visit (HOSPITAL_COMMUNITY): Payer: Self-pay | Admitting: Oncology

## 2015-08-11 DIAGNOSIS — D509 Iron deficiency anemia, unspecified: Secondary | ICD-10-CM

## 2015-08-16 ENCOUNTER — Encounter (HOSPITAL_COMMUNITY): Payer: Medicare Other | Attending: Hematology & Oncology

## 2015-08-16 DIAGNOSIS — D509 Iron deficiency anemia, unspecified: Secondary | ICD-10-CM | POA: Insufficient documentation

## 2015-08-16 LAB — CBC WITH DIFFERENTIAL/PLATELET
BASOS ABS: 0 10*3/uL (ref 0.0–0.1)
BASOS PCT: 0 %
EOS ABS: 0.2 10*3/uL (ref 0.0–0.7)
EOS PCT: 4 %
HCT: 42.8 % (ref 39.0–52.0)
HEMOGLOBIN: 13.8 g/dL (ref 13.0–17.0)
LYMPHS ABS: 2.6 10*3/uL (ref 0.7–4.0)
Lymphocytes Relative: 45 %
MCH: 27.9 pg (ref 26.0–34.0)
MCHC: 32.2 g/dL (ref 30.0–36.0)
MCV: 86.5 fL (ref 78.0–100.0)
Monocytes Absolute: 0.6 10*3/uL (ref 0.1–1.0)
Monocytes Relative: 11 %
NEUTROS PCT: 41 %
Neutro Abs: 2.4 10*3/uL (ref 1.7–7.7)
PLATELETS: 176 10*3/uL (ref 150–400)
RBC: 4.95 MIL/uL (ref 4.22–5.81)
RDW: 14.7 % (ref 11.5–15.5)
WBC: 5.9 10*3/uL (ref 4.0–10.5)

## 2015-08-16 LAB — IRON AND TIBC
Iron: 70 ug/dL (ref 45–182)
SATURATION RATIOS: 17 % — AB (ref 17.9–39.5)
TIBC: 423 ug/dL (ref 250–450)
UIBC: 353 ug/dL

## 2015-08-16 LAB — FERRITIN: FERRITIN: 18 ng/mL — AB (ref 24–336)

## 2015-08-19 ENCOUNTER — Other Ambulatory Visit: Payer: Self-pay | Admitting: *Deleted

## 2015-08-19 ENCOUNTER — Other Ambulatory Visit (HOSPITAL_COMMUNITY): Payer: Self-pay | Admitting: Oncology

## 2015-08-19 ENCOUNTER — Telehealth: Payer: Self-pay | Admitting: Orthopedic Surgery

## 2015-08-19 DIAGNOSIS — D509 Iron deficiency anemia, unspecified: Secondary | ICD-10-CM

## 2015-08-19 DIAGNOSIS — G8929 Other chronic pain: Secondary | ICD-10-CM

## 2015-08-19 MED ORDER — HYDROCODONE-ACETAMINOPHEN 5-325 MG PO TABS: 1.0000 | ORAL_TABLET | ORAL | Status: DC | PRN

## 2015-08-19 NOTE — Telephone Encounter (Signed)
Prescription available, patient aware  

## 2015-08-19 NOTE — Telephone Encounter (Signed)
Patient called for refill of medication: HYDROcodone-acetaminophen (NORCO) 5-325 MG tablet [id number]  - ph# [phone]

## 2015-08-22 ENCOUNTER — Other Ambulatory Visit (HOSPITAL_COMMUNITY): Payer: Self-pay

## 2015-08-22 DIAGNOSIS — D509 Iron deficiency anemia, unspecified: Secondary | ICD-10-CM | POA: Diagnosis not present

## 2015-08-22 LAB — OCCULT BLOOD X 1 CARD TO LAB, STOOL
Fecal Occult Bld: NEGATIVE
Fecal Occult Bld: NEGATIVE
Fecal Occult Bld: NEGATIVE

## 2015-08-23 ENCOUNTER — Telehealth (HOSPITAL_COMMUNITY): Payer: Self-pay | Admitting: Emergency Medicine

## 2015-08-23 ENCOUNTER — Other Ambulatory Visit (HOSPITAL_COMMUNITY): Payer: Self-pay | Admitting: Oncology

## 2015-08-23 DIAGNOSIS — D509 Iron deficiency anemia, unspecified: Secondary | ICD-10-CM

## 2015-08-23 NOTE — Telephone Encounter (Signed)
Pt notified stool cards were negative

## 2015-08-23 NOTE — Telephone Encounter (Signed)
-----   Message from Baird Cancer, PA-C sent at 08/23/2015 11:45 AM EST ----- Negative

## 2015-09-06 ENCOUNTER — Encounter (HOSPITAL_COMMUNITY): Payer: Medicare Other | Attending: Hematology & Oncology

## 2015-09-06 ENCOUNTER — Encounter (HOSPITAL_COMMUNITY): Payer: Self-pay

## 2015-09-06 VITALS — BP 130/76 | HR 106 | Temp 98.3°F | Resp 20

## 2015-09-06 DIAGNOSIS — D509 Iron deficiency anemia, unspecified: Secondary | ICD-10-CM | POA: Insufficient documentation

## 2015-09-06 DIAGNOSIS — D3A8 Other benign neuroendocrine tumors: Secondary | ICD-10-CM

## 2015-09-06 LAB — COMPREHENSIVE METABOLIC PANEL
ALBUMIN: 3.9 g/dL (ref 3.5–5.0)
ALK PHOS: 62 U/L (ref 38–126)
ALT: 20 U/L (ref 17–63)
AST: 37 U/L (ref 15–41)
Anion gap: 11 (ref 5–15)
BUN: 18 mg/dL (ref 6–20)
CHLORIDE: 103 mmol/L (ref 101–111)
CO2: 25 mmol/L (ref 22–32)
CREATININE: 1.25 mg/dL — AB (ref 0.61–1.24)
Calcium: 9.1 mg/dL (ref 8.9–10.3)
GFR calc non Af Amer: 58 mL/min — ABNORMAL LOW (ref >60)
GLUCOSE: 108 mg/dL — AB (ref 65–99)
Potassium: 3.8 mmol/L (ref 3.5–5.1)
SODIUM: 139 mmol/L (ref 135–145)
Total Bilirubin: 0.9 mg/dL (ref 0.3–1.2)
Total Protein: 8 g/dL (ref 6.5–8.1)

## 2015-09-06 MED ORDER — SODIUM CHLORIDE 0.9 % IJ SOLN
10.0000 mL | Freq: Once | INTRAMUSCULAR | Status: AC
  Administered 2015-09-06: 10 mL via INTRAVENOUS

## 2015-09-06 MED ORDER — SODIUM CHLORIDE 0.9 % IV SOLN
Freq: Once | INTRAVENOUS | Status: AC
  Administered 2015-09-06: 10:00:00 via INTRAVENOUS

## 2015-09-06 MED ORDER — SODIUM CHLORIDE 0.9 % IV SOLN
125.0000 mg | Freq: Once | INTRAVENOUS | Status: AC
Start: 2015-09-06 — End: 2015-09-06
  Administered 2015-09-06: 125 mg via INTRAVENOUS
  Filled 2015-09-06: qty 10

## 2015-09-06 NOTE — Patient Instructions (Addendum)
..  [place] at [facility] Discharge Instructions  RECOMMENDATIONS MADE BY THE CONSULTANT AND ANY TEST RESULTS WILL BE SENT TO YOUR REFERRING PHYSICIAN.  Iron infusion today Return Wednesday Jan 11th to 2nd floor to see the Dr.  Lujean Rave you for choosing [place] at [facility] to provide your oncology and hematology care.  To afford each patient quality time with our provider, please arrive at least 15 minutes before your scheduled appointment time.    You need to re-schedule your appointment should you arrive 10 or more minutes late.  We strive to give you quality time with our providers, and arriving late affects you and other patients whose appointments are after yours.  Also, if you no show three or more times for appointments you may be dismissed from the clinic at the providers discretion.     Again, thank you for choosing [facility].  Our hope is that these requests will decrease the amount of time that you wait before being seen by our physicians.       _____________________________________________________________  Should you have questions after your visit to [facility], please contact our office at [phone] between the hours of 8:30 a.m. and 4:30 p.m.  Voicemails left after 4:30 p.m. will not be returned until the following business day.  For prescription refill requests, have your pharmacy contact our office.

## 2015-09-06 NOTE — Progress Notes (Signed)
..  [name] arrives today for iron infusion. He tolerated infusion well.

## 2015-09-09 LAB — SEROTONIN SERUM: SEROTONIN, SERUM: 69 ng/mL (ref 21–321)

## 2015-09-09 LAB — CHROMOGRANIN A: Chromogranin A: 5 nmol/L (ref 0–5)

## 2015-09-09 NOTE — Assessment & Plan Note (Signed)
Iron deficiency without anemia.  Ferritin in the teens.  S/P ferric gluconate infusion:  Oncology Flowsheet 09/06/2015  ferric gluconate (NULECIT) IV 125 mg   Will recheck iron studies with next lab appointment.

## 2015-09-09 NOTE — Progress Notes (Signed)
Philip Hampshire, MD [place] [address]  Neuroendocrine tumor  ANEMIA, IRON DEFICIENCY  CURRENT THERAPY: Observation  INTERVAL HISTORY: [name] 65 y.o. male returns for followup of carcinoid tumor found incidentally on EGD.   I personally reviewed and went over pathology results with the patient.  [name] underwent an EGD by Dr. Oneida Alar on 03/12/2015 with biopsy.  Results are negative for malignancy.  He has an appointment with Dr. Oneida Alar tomorrow and I will request repeat EGD with biopsy in Jan/Feb 2017.  He is asymptomatic without any nausea/vomiting, abdominal pain, decreased appetite, flushing, heart palpations, constipation, diarrhea.  His appetite is strong and no weight loss identifed.    He went to his son's house for New Years and Christmas.    He absolutely denies any complaints.  He is S/P 125 mg of ferric gluconate last month.     Past Medical History  Diagnosis Date  . Diverticulosis 2008 LGIB  . Gout   . HTN (hypertension)   . History of septic arthritis   . History of alcohol abuse   . Anemia FeDA: GASTRIC POLYPS, B12    TCS 2008 EGD 2009, 2008-HB 11.1 MCV 83.6 CR 1.22, 2009 FERRITIN 102-22  . Hepatomegaly 2o to FATTY LIVER DZ  . Chronic knee pain   . B12 deficiency   . Hypothyroidism   . DIVERTICULOSIS, COLON 08/15/2006    Qualifier: Diagnosis of  By: Jonna Munro MD, Roderic Scarce    . PUD 11/30/2008    Qualifier: History of  By: Jonna Munro MD, Roderic Scarce    . Carcinoid tumor determined by biopsy of stomach     has B12 DEFICIENCY; ANEMIA, IRON DEFICIENCY; ABUSE, ALCOHOL, UNSPECIFIED; HYPERTENSION; GERD; IBS; Fatty liver; HYPRTRPHY PROSTATE BNG W/O URINARY OBST/LUTS; OSTEOARTHRITIS; KNEE, ARTHRITIS, DEGEN./OSTEO; DEGENERATION, DISC NOS; Thrombocytopenia, unspecified ([place]); Elevated LFTs; and Neuroendocrine tumor on his problem list.     is allergic to aspirin.  [name] had no medications administered during this visit.  Past  Surgical History  Procedure Laterality Date  . Upper gastrointestinal endoscopy  APR 2009    INFLAMED HYPERPLASTIC POLYPS, CHRONIC GASTRITIS  . Colonoscopy  2008 [facility] DJ    LGIB 2o to TICS, prep good  . Replacement total knee bilateral    . Hand surgery    . Cholecystectomy    . Esophagogastroduodenoscopy  12/08/2007    [id number] gastric polyps seen in the cardia and body of the stomach/Normal esophagus without evidence of Barrett's, mass, erosion/Normal duodenal bulb and second portion of the duodenum. Benign bx.  . Colonoscopy with propofol N/A 08/14/2014    SLF: 1. Four large colon polyps removed. 2. The left colon is redundant 3. Moderate diverticulosis throughout teh entire examined colon  . Esophagogastroduodenoscopy (egd) with propofol N/A 08/14/2014    SLF: 1. Heme postive stools due to gastric and colon polyps 2. Multiple gastric   . Polypectomy  08/14/2014    Procedure: POLYPECTOMY;  Surgeon: Danie Binder, MD;  Location: [facility];  Service: Endoscopy;;  . Esophageal biopsy  08/14/2014    Procedure: BIOPSY;  Surgeon: Danie Binder, MD;  Location: [facility];  Service: Endoscopy;;  . Colonoscopy with propofol N/A 11/06/2014    SLF: 8 small polyps removed. One large pedunculated polyp removed from the ascending colon, tubulovillous and tubular adenomas. Next colonoscopy March 2019  . Polypectomy N/A 11/06/2014    Procedure: POLYPECTOMY;  Surgeon: Danie Binder, MD;  Location: [facility];  Service:  Endoscopy;  Laterality: N/A;  Transverse Colon x3, Ascending Colon x2, Descending Colon x3  . Esophagogastroduodenoscopy (egd) with propofol N/A 03/12/2015    SLF: Multiple gastric nodules seen in gasric body/antrum. 2. Non-erosive gastritis ( inflammation) was found in the gastric antrum.   . Esophageal biopsy  03/12/2015    Procedure: BIOPSY;  Surgeon: Danie Binder, MD;  Location: [facility];  Service: Endoscopy;;    Denies any headaches, dizziness, double vision, fevers, chills, night sweats, nausea,  vomiting, diarrhea, constipation, chest pain, heart palpitations, shortness of breath, blood in stool, black tarry stool, urinary pain, urinary burning, urinary frequency, hematuria.   PHYSICAL EXAMINATION  ECOG PERFORMANCE STATUS: 0 - Asymptomatic  Filed Vitals:   09/11/15 1341  BP: 125/77  Pulse: 90  Temp: 98.3 F (36.8 C)  Resp: 20    GENERAL:alert, no distress, well nourished, well developed, comfortable, cooperative and smiling, unaccompanied SKIN: skin color, texture, turgor are normal, no rashes or significant lesions HEAD: Normocephalic, No masses, lesions, tenderness or abnormalities EYES: normal, PERRLA, EOMI, Conjunctiva are pink and non-injected EARS: External ears normal OROPHARYNX:lips, buccal mucosa, and tongue normal and mucous membranes are moist  NECK: supple, no adenopathy, thyroid normal size, non-tender, without nodularity, no stridor, non-tender, trachea midline LYMPH:  no palpable lymphadenopathy BREAST:not examined LUNGS: clear to auscultation and percussion HEART: regular rate & rhythm without murmur, rub, or gallop. ABDOMEN:abdomen soft, non-tender and normal bowel sounds BACK: Back symmetric, no curvature. EXTREMITIES:less then 2 second capillary refill, no joint deformities, effusion, or inflammation, no skin discoloration, no cyanosis, with right wrist (radial side) scar and edema from past injury. NEURO: alert & oriented x 3 with fluent speech, no focal motor/sensory deficits, gait normal, speech stutter at times.   LABORATORY DATA: CBC    Component Value Date/Time   WBC 5.9 08/16/2015 0932   RBC 4.95 08/16/2015 0932   RBC 3.20* 03/17/2007 0405   HGB 13.8 08/16/2015 0932   HCT 42.8 08/16/2015 0932   HCT 44 01/05/2013   PLT 176 08/16/2015 0932   PLT 148 01/05/2013   MCV 86.5 08/16/2015 0932   MCV 90.4 01/05/2013   MCH 27.9 08/16/2015 0932   MCHC 32.2 08/16/2015 0932   RDW 14.7 08/16/2015 0932   LYMPHSABS 2.6 08/16/2015 0932   MONOABS 0.6  08/16/2015 0932   EOSABS 0.2 08/16/2015 0932   BASOSABS 0.0 08/16/2015 0932      Chemistry      Component Value Date/Time   NA 139 09/06/2015 1205   K 3.8 09/06/2015 1205   CL 103 09/06/2015 1205   CO2 25 09/06/2015 1205   BUN 18 09/06/2015 1205   CREATININE 1.25* 09/06/2015 1205   CREATININE 1.30 02/24/2012 1023      Component Value Date/Time   CALCIUM 9.1 09/06/2015 1205   ALKPHOS 62 09/06/2015 1205   AST 37 09/06/2015 1205   ALT 20 09/06/2015 1205   BILITOT 0.9 09/06/2015 1205     Lab Results  Component Value Date   FERRITIN 18* 08/16/2015     PATHOLOGY:   03/12/2015  Stomach, biopsy - EROSIVE CHRONIC ACTIVE ATROPHIC GASTRITIS WITH INTESTINAL METAPLASIA (ANTRAL MUCOSA). - NEGATIVE FOR DYSPLASIA OR MALIGNANCY. - WARTHIN-STARRY STAIN NEGATIVE FOR H. PYLORI.   RADIOGRAPHIC STUDIES:  No results found.   ASSESSMENT AND PLAN:  Neuroendocrine tumor Pleasant 65 year old male with a history of alcohol abuse, fatty liver, he underwent upper endoscopy with Dr. Oneida Alar. He had a gastric polyp that was removed that showed pathology consistent with a  low-grade neuroendocrine tumor. In addition he had a biopsy of the stomach that showed focal involvement by low-grade neuroendocrine tumor. Based upon his pathology, there is concern of residual disease or more extensive disease within his stomach.  Octreotide scan on 09/19/2014 was negative for any avid metastasis.  His case was presented to GI tumor board to help further solidify recommendations.     He is S/P EGD and biopsy on 03/12/2015 by Dr. Oneida Alar.  Pathology is negative for malignancy.  He will need another surveillance EGD in 6 months.  He has an appointment with Dr. Oneida Alar tomorrow, 09/12/2015.  I will request a repeat EGD in Jan/Feb 2017 for repeat biopsies.  Labs in 4 months: CBC diff, CMET, serum serotonin, chromogranin A.  Return in 4 months for follow-up.  Sooner if necessary depending on EGD results upcoming with Dr.  Oneida Alar.  ANEMIA, IRON DEFICIENCY Iron deficiency without anemia.  Ferritin in the teens.  S/P ferric gluconate infusion:  Oncology Flowsheet 09/06/2015  ferric gluconate (NULECIT) IV 125 mg   Will recheck iron studies with next lab appointment.  THERAPY PLAN:  Continue surveillance with EGDs and labs.  He has an upcoming appointment with Dr. Oneida Alar tomorrow, 09/12/2015.  I will request scheduling of repeat EGD with biopsy in Jan/Feb 2017.  All questions were answered. The patient knows to call the clinic with any problems, questions or concerns. We can certainly see the patient much sooner if necessary.  Patient and plan discussed with Dr. Ancil Linsey and she is in agreement with the aforementioned.   [name] 09/11/2015 2:02 PM

## 2015-09-09 NOTE — Assessment & Plan Note (Addendum)
Pleasant 65 year old male with a history of alcohol abuse, fatty liver, he underwent upper endoscopy with Dr. Oneida Alar. He had a gastric polyp that was removed that showed pathology consistent with a low-grade neuroendocrine tumor. In addition he had a biopsy of the stomach that showed focal involvement by low-grade neuroendocrine tumor. Based upon his pathology, there is concern of residual disease or more extensive disease within his stomach.  Octreotide scan on 09/19/2014 was negative for any avid metastasis.  His case was presented to GI tumor board to help further solidify recommendations.     He is S/P EGD and biopsy on 03/12/2015 by Dr. Oneida Alar.  Pathology is negative for malignancy.  He will need another surveillance EGD in 6 months.  He has an appointment with Dr. Oneida Alar tomorrow, 09/12/2015.  I will request a repeat EGD in Jan/Feb 2017 for repeat biopsies.  Labs in 4 months: CBC diff, CMET, serum serotonin, chromogranin A.  Return in 4 months for follow-up.  Sooner if necessary depending on EGD results upcoming with Dr. Oneida Alar.

## 2015-09-11 ENCOUNTER — Other Ambulatory Visit (HOSPITAL_COMMUNITY): Payer: Medicaid Other

## 2015-09-11 ENCOUNTER — Encounter (HOSPITAL_BASED_OUTPATIENT_CLINIC_OR_DEPARTMENT_OTHER): Payer: Medicare Other | Admitting: Oncology

## 2015-09-11 ENCOUNTER — Encounter (HOSPITAL_COMMUNITY): Payer: Self-pay | Admitting: Oncology

## 2015-09-11 VITALS — BP 125/77 | HR 90 | Temp 98.3°F | Resp 20 | Wt 252.6 lb

## 2015-09-11 DIAGNOSIS — D3A8 Other benign neuroendocrine tumors: Secondary | ICD-10-CM | POA: Diagnosis not present

## 2015-09-11 DIAGNOSIS — D509 Iron deficiency anemia, unspecified: Secondary | ICD-10-CM

## 2015-09-11 DIAGNOSIS — E611 Iron deficiency: Secondary | ICD-10-CM

## 2015-09-11 NOTE — Patient Instructions (Addendum)
[place] at [facility] Discharge Instructions  RECOMMENDATIONS MADE BY THE CONSULTANT AND ANY TEST RESULTS WILL BE SENT TO YOUR REFERRING PHYSICIAN.   Exam completed by Kirby Crigler PA today Appointment with Dr Oneida Alar tomorrow-will recommend repeat EGD with biopsies in jan/feb Labs in 4 months  Return to see the doctor in 4 months  Please call the clinic if you have any questions or concerns    Thank you for choosing [place] at [facility] to provide your oncology and hematology care.  To afford each patient quality time with our provider, please arrive at least 15 minutes before your scheduled appointment time.    You need to re-schedule your appointment should you arrive 10 or more minutes late.  We strive to give you quality time with our providers, and arriving late affects you and other patients whose appointments are after yours.  Also, if you no show three or more times for appointments you may be dismissed from the clinic at the providers discretion.     Again, thank you for choosing [facility].  Our hope is that these requests will decrease the amount of time that you wait before being seen by our physicians.       _____________________________________________________________  Should you have questions after your visit to [facility], please contact our office at [phone] between the hours of 8:30 a.m. and 4:30 p.m.  Voicemails left after 4:30 p.m. will not be returned until the following business day.  For prescription refill requests, have your pharmacy contact our office.

## 2015-09-12 ENCOUNTER — Other Ambulatory Visit: Payer: Self-pay

## 2015-09-12 ENCOUNTER — Ambulatory Visit (INDEPENDENT_AMBULATORY_CARE_PROVIDER_SITE_OTHER): Payer: Medicare Other | Admitting: Gastroenterology

## 2015-09-12 ENCOUNTER — Encounter: Payer: Self-pay | Admitting: Gastroenterology

## 2015-09-12 VITALS — BP 130/72 | HR 78 | Temp 97.6°F | Ht 73.0 in | Wt 247.0 lb

## 2015-09-12 DIAGNOSIS — D509 Iron deficiency anemia, unspecified: Secondary | ICD-10-CM | POA: Diagnosis not present

## 2015-09-12 DIAGNOSIS — D489 Neoplasm of uncertain behavior, unspecified: Secondary | ICD-10-CM | POA: Diagnosis not present

## 2015-09-12 DIAGNOSIS — D126 Benign neoplasm of colon, unspecified: Secondary | ICD-10-CM

## 2015-09-12 DIAGNOSIS — D3A8 Other benign neuroendocrine tumors: Secondary | ICD-10-CM

## 2015-09-12 NOTE — Assessment & Plan Note (Signed)
NO WARNING SIGNS/SYMPTOMS-COMPLETELY RESECTED IN DEC 2915.  NEXT TCS IN 2018. DISCUSSED WITH PT.

## 2015-09-12 NOTE — Progress Notes (Signed)
ON RECALL  °

## 2015-09-12 NOTE — Progress Notes (Signed)
cc'ed to pcp °

## 2015-09-12 NOTE — Assessment & Plan Note (Signed)
NO BRBPR OR MELENA. SOURCE FOR IVFE REQUIREMENT.   ATTEMPTED TO CALL HEMATOLOGY. PT MAY NEED GIVENS CAPSULE TO ASSESS FOR CARCINOID IN THE SMALL BOWEL. AWAITING RETURN CALL. EGD FEB 2017.

## 2015-09-12 NOTE — Patient Instructions (Signed)
CONTINUE OMEPRAZOLE.  TAKE 30 MINUTES PRIOR TO YOUR MEALS TWICE DAILY.

## 2015-09-12 NOTE — Progress Notes (Signed)
Subjective:    Patient ID: [name], male    DOB: Jan 16, 1951, 65 y.o.   MRN: [id number]  [name], MD  HPI PT CONCERNED ABOUT HAVING ANOTHER TCS. SAW ONCOLOGY YESTERDAY. NEED REPEAT EGD. No ADDITIONAL questions or concerns. LIKES TO WATCH DAYTIME SOAP OPERAS: VICTOR NEWMAN. PT DENIES FEVER, CHILLS, HEMATOCHEZIA, HEMATEMESIS, nausea, vomiting, melena, diarrhea, CHEST PAIN, SHORTNESS OF BREATH,  CHANGE IN BOWEL IN HABITS, constipation, abdominal pain, problems with sedation, OR heartburn or indigestion.   Past Medical History  Diagnosis Date  . Diverticulosis 2008 LGIB  . Gout   . HTN (hypertension)   . History of septic arthritis   . History of alcohol abuse   . Anemia FeDA: GASTRIC POLYPS, B12    TCS 2008 EGD 2009, 2008-HB 11.1 MCV 83.6 CR 1.22, 2009 FERRITIN 102-22  . Hepatomegaly 2o to FATTY LIVER DZ  . Chronic knee pain   . B12 deficiency   . Hypothyroidism   . DIVERTICULOSIS, COLON 08/15/2006    Qualifier: Diagnosis of  By: Jonna Munro MD, Roderic Scarce    . PUD 11/30/2008    Qualifier: History of  By: Jonna Munro MD, Roderic Scarce    . Carcinoid tumor determined by biopsy of stomach     Past Surgical History  Procedure Laterality Date  . Upper gastrointestinal endoscopy  APR 2009    INFLAMED HYPERPLASTIC POLYPS, CHRONIC GASTRITIS  . Colonoscopy  2008 [facility] DJ    LGIB 2o to TICS, prep good  . Replacement total knee bilateral    . Hand surgery    . Cholecystectomy    . Esophagogastroduodenoscopy  12/08/2007    [id number] gastric polyps seen in the cardia and body of the stomach/Normal esophagus without evidence of Barrett's, mass, erosion/Normal duodenal bulb and second portion of the duodenum. Benign bx.  . Colonoscopy with propofol N/A 08/14/2014    SLF: 1. Four large colon polyps removed. 2. The left colon is redundant 3. Moderate diverticulosis throughout teh entire examined colon  . Esophagogastroduodenoscopy (egd) with propofol N/A 08/14/2014    SLF: 1. Heme postive  stools due to gastric and colon polyps 2. Multiple gastric   . Polypectomy  08/14/2014    Procedure: POLYPECTOMY;  Surgeon: Danie Binder, MD;  Location: [facility];  Service: Endoscopy;;  . Esophageal biopsy  08/14/2014    Procedure: BIOPSY;  Surgeon: Danie Binder, MD;  Location: [facility];  Service: Endoscopy;;  . Colonoscopy with propofol N/A 11/06/2014    SLF: 8 small polyps removed. One large pedunculated polyp removed from the ascending colon, tubulovillous and tubular adenomas. Next colonoscopy March 2019  . Polypectomy N/A 11/06/2014    Procedure: POLYPECTOMY;  Surgeon: Danie Binder, MD;  Location: [facility];  Service: Endoscopy;  Laterality: N/A;  Transverse Colon x3, Ascending Colon x2, Descending Colon x3  . Esophagogastroduodenoscopy (egd) with propofol N/A 03/12/2015    SLF: Multiple gastric nodules seen in gasric body/antrum. 2. Non-erosive gastritis ( inflammation) was found in the gastric antrum.   . Esophageal biopsy  03/12/2015    Procedure: BIOPSY;  Surgeon: Danie Binder, MD;  Location: [facility];  Service: Endoscopy;;    Allergies  Allergen Reactions  . Aspirin     REACTION: Diverticular Bleed   Current Outpatient Prescriptions  Medication Sig Dispense Refill  . acetaminophen (TYLENOL) 500 MG tablet Take 500 mg by mouth every 6 (six) hours as needed for moderate pain.    Marland Kitchen amLODipine (NORVASC) 10 MG tablet Take 10 mg by mouth daily.    Marland Kitchen  cyanocobalamin (,VITAMIN B-12,) 1000 MCG/ML injection Inject 1,000 mcg into the muscle every 30 (thirty) days.      . febuxostat (ULORIC) 40 MG tablet Take 40 mg by mouth daily.     Marland Kitchen levothyroxine (SYNTHROID, LEVOTHROID) 175 MCG tablet Take 175 mcg by mouth daily before breakfast.     . metoprolol succinate (TOPROL-XL) 25 MG 24 hr tablet Take 25 mg by mouth 2 (two) times daily.     Marland Kitchen omeprazole (PRILOSEC) 20 MG capsule     . tamsulosin (FLOMAX) 0.4 MG CAPS capsule Take 0.4 mg by mouth daily.    . cyclobenzaprine (FLEXERIL) 10 MG tablet Take 1  tablet (10 mg total) by mouth 3 (three) times daily as needed for muscle spasms. (Patient not taking: Reported on 09/12/2015)    .       Review of Systems PER HPI OTHERWISE ALL SYSTEMS ARE NEGATIVE.    Objective:   Physical Exam  Constitutional: He is oriented to person, place, and time. He appears well-developed and well-nourished. No distress.  HENT:  Head: Normocephalic and atraumatic.  Mouth/Throat: Oropharynx is clear and moist. No oropharyngeal exudate.  Eyes: Pupils are equal, round, and reactive to light. No scleral icterus.  Neck: Normal range of motion. Neck supple.  Cardiovascular: Normal rate, regular rhythm and normal heart sounds.   Pulmonary/Chest: Effort normal and breath sounds normal. No respiratory distress.  Abdominal: Soft. Bowel sounds are normal. He exhibits no distension. There is no tenderness.  Musculoskeletal: He exhibits no edema.  Lymphadenopathy:    He has no cervical adenopathy.  Neurological: He is alert and oriented to person, place, and time.  NO FOCAL DEFICITS  Psychiatric: He has a normal mood and affect.  Vitals reviewed.     Assessment & Plan:

## 2015-09-12 NOTE — Assessment & Plan Note (Addendum)
NO BRBPR OR MELENA. LAST OCTREOTIDE SCAN JAN 2016: NO DISTANT METS.  EGD FEB 2017. DISCUSSED PROCEDURE, BENEFITS, & RISKS: < 1% chance of medication reaction, OR bleeding. FOLLOW UP IN 6 MOS.

## 2015-09-23 ENCOUNTER — Telehealth: Payer: Self-pay | Admitting: *Deleted

## 2015-09-23 ENCOUNTER — Other Ambulatory Visit: Payer: Self-pay | Admitting: *Deleted

## 2015-09-23 DIAGNOSIS — G8929 Other chronic pain: Secondary | ICD-10-CM

## 2015-09-23 MED ORDER — HYDROCODONE-ACETAMINOPHEN 5-325 MG PO TABS: 1.0000 | ORAL_TABLET | ORAL | Status: DC | PRN

## 2015-09-23 NOTE — Telephone Encounter (Signed)
Prescription available, patient aware  

## 2015-09-23 NOTE — Telephone Encounter (Signed)
Patient called requesting his hydrocodone to be refilled. Please advise [phone]

## 2015-10-02 NOTE — Patient Instructions (Signed)
[name]  10/02/2015     @PREFPERIOPPHARMACY @   Your procedure is scheduled on  10/08/2015   Report to [facility] at  80  A.M.  Call this number if you have problems the morning of surgery:  [phone]   Remember:  Do not eat food or drink liquids after midnight.  Take these medicines the morning of surgery with A SIP OF WATER  Amlodipine, uloric, hydrocodone, levothyroxine, metoprolol,prilosec, flomax.   Do not wear jewelry, make-up or nail polish.  Do not wear lotions, powders, or perfumes.  You may wear deodorant.  Do not shave 48 hours prior to surgery.  Men may shave face and neck.  Do not bring valuables to the hospital.  [facility] is not responsible for any belongings or valuables.  Contacts, dentures or bridgework may not be worn into surgery.  Leave your suitcase in the car.  After surgery it may be brought to your room.  For patients admitted to the hospital, discharge time will be determined by your treatment team.  Patients discharged the day of surgery will not be allowed to drive home.   Name and phone number of your driver:   family Special instructions:  Follow the diet instructions given to you by Dr Nona Dell office.  Please read over the following fact sheets that you were given. Coughing and Deep Breathing, Surgical Site Infection Prevention, Anesthesia Post-op Instructions and Care and Recovery After Surgery      Esophagogastroduodenoscopy Esophagogastroduodenoscopy (EGD) is a procedure that is used to examine the lining of the esophagus, stomach, and first part of the small intestine (duodenum). A long, flexible, lighted tube with a camera attached (endoscope) is inserted down the throat to view these organs. This procedure is done to detect problems or abnormalities, such as inflammation, bleeding, ulcers, or growths, in order to treat them. The procedure lasts 5-20 minutes. It is usually an outpatient procedure, but it may need to be  performed in a hospital in emergency cases. LET [facility] CARE PROVIDER KNOW ABOUT:  Any allergies you have.  All medicines you are taking, including vitamins, herbs, eye drops, creams, and over-the-counter medicines.  Previous problems you or members of your family have had with the use of anesthetics.  Any blood disorders you have.  Previous surgeries you have had.  Medical conditions you have. RISKS AND COMPLICATIONS Generally, this is a safe procedure. However, problems can occur and include:  Infection.  Bleeding.  Tearing (perforation) of the esophagus, stomach, or duodenum.  Difficulty breathing or not being able to breathe.  Excessive sweating.  Spasms of the larynx.  Slowed heartbeat.  Low blood pressure. BEFORE THE PROCEDURE  Do not eat or drink anything after midnight on the night before the procedure or as directed by your health care provider.  Do not take your regular medicines before the procedure if your health care provider asks you not to. Ask your health care provider about changing or stopping those medicines.  If you wear dentures, be prepared to remove them before the procedure.  Arrange for someone to drive you home after the procedure. PROCEDURE  A numbing medicine (local anesthetic) may be sprayed in your throat for comfort and to stop you from gagging or coughing.  You will have an IV tube inserted in a vein in your hand or arm. You will receive medicines and fluids through this tube.  You will be given a medicine to  relax you (sedative).  A pain reliever will be given through the IV tube.  A mouth guard may be placed in your mouth to protect your teeth and to keep you from biting on the endoscope.  You will be asked to lie on your left side.  The endoscope will be inserted down your throat and into your esophagus, stomach, and duodenum.  Air will be put through the endoscope to allow your health care provider to clearly view the  lining of your esophagus.  The lining of your esophagus, stomach, and duodenum will be examined. During the exam, your health care provider may:  Remove tissue to be examined under a microscope (biopsy) for inflammation, infection, or other medical problems.  Remove growths.  Remove objects (foreign bodies) that are stuck.  Treat any bleeding with medicines or other devices that stop tissues from bleeding (hot cautery, clipping devices).  Widen (dilate) or stretch narrowed areas of your esophagus and stomach.  The endoscope will be withdrawn. AFTER THE PROCEDURE  You will be taken to a recovery area for observation. Your blood pressure, heart rate, breathing rate, and blood oxygen level will be monitored often until the medicines you were given have worn off.  Do not eat or drink anything until the numbing medicine has worn off and your gag reflex has returned. You may choke.  Your health care provider should be able to discuss his or her findings with you. It will take longer to discuss the test results if any biopsies were taken.   This information is not intended to replace advice given to you by your health care provider. Make sure you discuss any questions you have with your health care provider.   Document Released: 12/18/2004 Document Revised: 09/07/2014 Document Reviewed: 07/20/2012 Elsevier Interactive Patient Education 2016 [place]. Esophagogastroduodenoscopy, Care After Refer to this sheet in the next few weeks. These instructions provide you with information about caring for yourself after your procedure. Your health care provider may also give you more specific instructions. Your treatment has been planned according to current medical practices, but problems sometimes occur. Call your health care provider if you have any problems or questions after your procedure. WHAT TO EXPECT AFTER THE PROCEDURE After your procedure, it is typical to feel:  Soreness in your  throat.  Pain with swallowing.  Sick to your stomach (nauseous).  Bloated.  Dizzy.  Fatigued. HOME CARE INSTRUCTIONS  Do not eat or drink anything until the numbing medicine (local anesthetic) has worn off and your gag reflex has returned. You will know that the local anesthetic has worn off when you can swallow comfortably.  Do not drive or operate machinery until directed by your health care provider.  Take medicines only as directed by your health care provider. SEEK MEDICAL CARE IF:   You cannot stop coughing.  You are not urinating at all or less than usual. SEEK IMMEDIATE MEDICAL CARE IF:  You have difficulty swallowing.  You cannot eat or drink.  You have worsening throat or chest pain.  You have dizziness or lightheadedness or you faint.  You have nausea or vomiting.  You have chills.  You have a fever.  You have severe abdominal pain.  You have black, tarry, or bloody stools.   This information is not intended to replace advice given to you by your health care provider. Make sure you discuss any questions you have with your health care provider.   Document Released: 08/03/2012 Document Revised: 09/07/2014 Document  Reviewed: 08/03/2012 Elsevier Interactive Patient Education 2016 [employer] PATIENT INSTRUCTIONS POST-ANESTHESIA  IMMEDIATELY FOLLOWING SURGERY:  Do not drive or operate machinery for the first twenty four hours after surgery.  Do not make any important decisions for twenty four hours after surgery or while taking narcotic pain medications or sedatives.  If you develop intractable nausea and vomiting or a severe headache please notify your doctor immediately.  FOLLOW-UP:  Please make an appointment with your surgeon as instructed. You do not need to follow up with anesthesia unless specifically instructed to do so.  WOUND CARE INSTRUCTIONS (if applicable):  Keep a dry clean dressing on the anesthesia/puncture wound site if there is  drainage.  Once the wound has quit draining you may leave it open to air.  Generally you should leave the bandage intact for twenty four hours unless there is drainage.  If the epidural site drains for more than 36-48 hours please call the anesthesia department.  QUESTIONS?:  Please feel free to call your physician or the hospital operator if you have any questions, and they will be happy to assist you.

## 2015-10-03 ENCOUNTER — Encounter (HOSPITAL_COMMUNITY): Payer: Self-pay

## 2015-10-03 ENCOUNTER — Encounter (HOSPITAL_COMMUNITY)
Admission: RE | Admit: 2015-10-03 | Discharge: 2015-10-03 | Disposition: A | Discharge status: Home / Self Care | Payer: Medicare Other | Source: Ambulatory Visit | Attending: Gastroenterology | Admitting: Gastroenterology

## 2015-10-03 DIAGNOSIS — D3A092 Benign carcinoid tumor of the stomach: Secondary | ICD-10-CM | POA: Insufficient documentation

## 2015-10-03 DIAGNOSIS — Z01812 Encounter for preprocedural laboratory examination: Secondary | ICD-10-CM | POA: Diagnosis not present

## 2015-10-03 DIAGNOSIS — Z01810 Encounter for preprocedural cardiovascular examination: Secondary | ICD-10-CM | POA: Diagnosis present

## 2015-10-03 DIAGNOSIS — K317 Polyp of stomach and duodenum: Secondary | ICD-10-CM | POA: Insufficient documentation

## 2015-10-03 LAB — CBC WITH DIFFERENTIAL/PLATELET
BASOS ABS: 0 10*3/uL (ref 0.0–0.1)
BASOS PCT: 1 %
EOS ABS: 0.3 10*3/uL (ref 0.0–0.7)
Eosinophils Relative: 4 %
HEMATOCRIT: 41.7 % (ref 39.0–52.0)
HEMOGLOBIN: 13.6 g/dL (ref 13.0–17.0)
Lymphocytes Relative: 39 %
Lymphs Abs: 2.5 10*3/uL (ref 0.7–4.0)
MCH: 28.9 pg (ref 26.0–34.0)
MCHC: 32.6 g/dL (ref 30.0–36.0)
MCV: 88.7 fL (ref 78.0–100.0)
Monocytes Absolute: 0.7 10*3/uL (ref 0.1–1.0)
Monocytes Relative: 10 %
NEUTROS ABS: 2.9 10*3/uL (ref 1.7–7.7)
NEUTROS PCT: 46 %
Platelets: 156 10*3/uL (ref 150–400)
RBC: 4.7 MIL/uL (ref 4.22–5.81)
RDW: 15.2 % (ref 11.5–15.5)
WBC: 6.4 10*3/uL (ref 4.0–10.5)

## 2015-10-03 LAB — BASIC METABOLIC PANEL
ANION GAP: 5 (ref 5–15)
BUN: 17 mg/dL (ref 6–20)
CHLORIDE: 108 mmol/L (ref 101–111)
CO2: 27 mmol/L (ref 22–32)
CREATININE: 1.27 mg/dL — AB (ref 0.61–1.24)
Calcium: 9.4 mg/dL (ref 8.9–10.3)
GFR calc non Af Amer: 57 mL/min — ABNORMAL LOW (ref >60)
Glucose, Bld: 107 mg/dL — ABNORMAL HIGH (ref 65–99)
Potassium: 4.1 mmol/L (ref 3.5–5.1)
SODIUM: 140 mmol/L (ref 135–145)

## 2015-10-08 ENCOUNTER — Ambulatory Visit (HOSPITAL_COMMUNITY): Payer: Medicare Other | Admitting: Anesthesiology

## 2015-10-08 ENCOUNTER — Encounter (HOSPITAL_COMMUNITY): Payer: Self-pay | Admitting: *Deleted

## 2015-10-08 ENCOUNTER — Ambulatory Visit (HOSPITAL_COMMUNITY)
Admission: RE | Admit: 2015-10-08 | Discharge: 2015-10-08 | Disposition: A | Discharge status: Home / Self Care | Payer: Medicare Other | Source: Ambulatory Visit | Attending: Gastroenterology | Admitting: Gastroenterology

## 2015-10-08 ENCOUNTER — Encounter (HOSPITAL_COMMUNITY)
Admission: RE | Disposition: A | Discharge status: Home / Self Care | Payer: Self-pay | Source: Ambulatory Visit | Attending: Gastroenterology

## 2015-10-08 DIAGNOSIS — E039 Hypothyroidism, unspecified: Secondary | ICD-10-CM | POA: Insufficient documentation

## 2015-10-08 DIAGNOSIS — D3A092 Benign carcinoid tumor of the stomach: Secondary | ICD-10-CM | POA: Diagnosis not present

## 2015-10-08 DIAGNOSIS — Z96653 Presence of artificial knee joint, bilateral: Secondary | ICD-10-CM | POA: Diagnosis not present

## 2015-10-08 DIAGNOSIS — Z79899 Other long term (current) drug therapy: Secondary | ICD-10-CM | POA: Diagnosis not present

## 2015-10-08 DIAGNOSIS — K317 Polyp of stomach and duodenum: Secondary | ICD-10-CM | POA: Diagnosis not present

## 2015-10-08 DIAGNOSIS — I10 Essential (primary) hypertension: Secondary | ICD-10-CM | POA: Insufficient documentation

## 2015-10-08 DIAGNOSIS — E538 Deficiency of other specified B group vitamins: Secondary | ICD-10-CM | POA: Diagnosis not present

## 2015-10-08 DIAGNOSIS — D509 Iron deficiency anemia, unspecified: Secondary | ICD-10-CM | POA: Insufficient documentation

## 2015-10-08 DIAGNOSIS — K319 Disease of stomach and duodenum, unspecified: Secondary | ICD-10-CM | POA: Insufficient documentation

## 2015-10-08 HISTORY — PX: ESOPHAGOGASTRODUODENOSCOPY (EGD) WITH PROPOFOL: SHX5813

## 2015-10-08 SURGERY — ESOPHAGOGASTRODUODENOSCOPY (EGD) WITH PROPOFOL
Anesthesia: Monitor Anesthesia Care

## 2015-10-08 MED ORDER — SODIUM CHLORIDE 0.9 % IJ SOLN
INTRAMUSCULAR | Status: DC | PRN
  Administered 2015-10-08: 7.5 mL

## 2015-10-08 MED ORDER — LIDOCAINE VISCOUS 2 % MT SOLN
OROMUCOSAL | Status: AC
  Filled 2015-10-08: qty 15

## 2015-10-08 MED ORDER — PROPOFOL 10 MG/ML IV BOLUS
INTRAVENOUS | Status: AC
  Filled 2015-10-08: qty 40

## 2015-10-08 MED ORDER — ONDANSETRON HCL 4 MG/2ML IJ SOLN: 4.0000 mg | Freq: Once | INTRAMUSCULAR | Status: DC | PRN

## 2015-10-08 MED ORDER — FENTANYL CITRATE (PF) 100 MCG/2ML IJ SOLN: 25.0000 ug | INTRAMUSCULAR | Status: DC | PRN

## 2015-10-08 MED ORDER — FENTANYL CITRATE (PF) 100 MCG/2ML IJ SOLN
INTRAMUSCULAR | Status: AC
  Filled 2015-10-08: qty 2

## 2015-10-08 MED ORDER — SPOT INK MARKER SYRINGE KIT
PACK | SUBMUCOSAL | Status: DC | PRN
  Administered 2015-10-08: 4 mL via SUBMUCOSAL

## 2015-10-08 MED ORDER — PROPOFOL 500 MG/50ML IV EMUL
INTRAVENOUS | Status: DC | PRN
  Administered 2015-10-08: 75 ug/kg/min via INTRAVENOUS
  Administered 2015-10-08 (×2): via INTRAVENOUS

## 2015-10-08 MED ORDER — MIDAZOLAM HCL 2 MG/2ML IJ SOLN
1.0000 mg | INTRAMUSCULAR | Status: DC | PRN
  Administered 2015-10-08: 2 mg via INTRAVENOUS
  Filled 2015-10-08: qty 2

## 2015-10-08 MED ORDER — LIDOCAINE VISCOUS 2 % MT SOLN
15.0000 mL | Freq: Once | OROMUCOSAL | Status: AC
  Administered 2015-10-08: 15 mL via OROMUCOSAL

## 2015-10-08 MED ORDER — FENTANYL CITRATE (PF) 100 MCG/2ML IJ SOLN
25.0000 ug | INTRAMUSCULAR | Status: AC
Start: 2015-10-08 — End: 2015-10-08
  Administered 2015-10-08 (×2): 25 ug via INTRAVENOUS

## 2015-10-08 MED ORDER — LACTATED RINGERS IV SOLN
INTRAVENOUS | Status: DC
Start: 2015-10-08 — End: 2015-10-08
  Administered 2015-10-08 (×2): via INTRAVENOUS

## 2015-10-08 NOTE — Op Note (Signed)
[facility] [address] [place], [address]   ENDOSCOPY PROCEDURE REPORT  PATIENT: Philip, King  MR#: [id number] BIRTHDATE: July 16, 1951 , 64  yrs. old GENDER: male  ENDOSCOPIST: Danie Binder, MD REFERRED [id number] Kefalas, P.A.  Marjean Donna, M.D. PROCEDURE DATE: October 23, 2015 PROCEDURE:   EGD w/ snare technique and EGD w/ directed submucosal injection: EPINEPHRINE/SPOT  INDICATIONS:iron deficiency anemia. LAST EGD JUL 2016 GASTRIC POLYP BIOPSIE SHOWED EROSIVE CHRONIC ACTIVE ATROPHIC GASTRITIS WITH INTESTINAL METAPLASIA MEDICATIONS: Monitored anesthesia care  TOPICAL ANESTHETIC: Viscous Xylocaine  ASA CLASS:  DESCRIPTION OF PROCEDURE:     Physical exam was performed.  Informed consent was obtained from the patient after explaining the benefits, risks, and alternatives to the procedure.  The patient was connected to the monitor and placed in the left lateral position.  Continuous oxygen was provided by nasal cannula and IV medicine administered through an indwelling cannula.  After administration of sedation, the patients esophagus was intubated and the [id number]  endoscope was advanced under direct visualization to the second portion of the duodenum.  The scope was removed slowly by carefully examining the color, texture, anatomy, and integrity of the mucosa on the way out.  The patient was recovered in endoscopy and discharged home in satisfactory condition.  Estimated blood loss is zero unless otherwise noted in this procedure report.    ESOPHAGUS: The mucosa of the esophagus appeared normal.   STOMACH: Five sessile polyps(6-12 MM) with mucous caps were found in the gastric body and cardia.  A polypectomy was performed with snare cautery. PRIOR TO POLYPECTOMY 4 CC SPOT AND 44ml of epinephrine 1:10,000 was performed around the POLYPECTOMY SITES with good treatment effect. 6 CLIPS PLACED TO PREVENT BLEEDING.    DUODENUM: The duodenal mucosa  showed no abnormalities in the bulb and 2nd part of the duodenum.        COMPLICATIONS: There were no immediate complications.  ENDOSCOPIC IMPRESSION: 1.   Five GASTRIC POLYPS REMOVED  RECOMMENDATIONS: NO MRI FOR 30 DAYS DUE TO METAL CLIP PLACEMENT IN THE STOMACH. DRINK WATER TO KEEP URINE LIGHT YELLOW. FOLLOW A LOW FAT DIET. CONTINUE OMEPRAZOLE. AWAIT BIOPSY RESULTS. FOLLOW UP IN JUL 2017.   REPEAT EXAM:   _______________________________ eSignedDanie Binder, MD 2015/10/23 11:43 AM     CPT CODES: ICD CODES:  The ICD and CPT codes recommended by this software are interpretations from the data that the clinical staff has captured with the software.  The verification of the translation of this report to the ICD and CPT codes and modifiers is the sole responsibility of the health care institution and practicing physician where this report was generated.  [place]. will not be held responsible for the validity of the ICD and CPT codes included on this report.  AMA assumes no liability for data contained or not contained herein. CPT is a Designer, television/film set of the [employer].   PATIENT NAME:  Philip, King MR#: [id number]

## 2015-10-08 NOTE — Interval H&P Note (Signed)
History and Physical Interval Note:  10/08/2015 8:48 AM  [name]  has presented today for surgery, with the diagnosis of gastric carcinoid  The various methods of treatment have been discussed with the patient and family. After consideration of risks, benefits and other options for treatment, the patient has consented to  Procedure(s) with comments: ESOPHAGOGASTRODUODENOSCOPY (EGD) WITH PROPOFOL (N/A) - 0945 as a surgical intervention .  The patient's history has been reviewed, patient examined, no change in status, stable for surgery.  I have reviewed the patient's chart and labs.  Questions were answered to the patient's satisfaction.     [employer]

## 2015-10-08 NOTE — Discharge Instructions (Signed)
You have gastritis, & GASTRIC POLYPS. I removed 5 polyps. I DID NOT PLACE THE CAPSULE.   NO MRI FOR 30 DAYS DUE TO METAL CLIP PLACEMENT IN THE STOMACH.  DRINK WATER TO KEEP YOUR URINE LIGHT YELLOW.  FOLLOW A LOW FAT DIET. SEE INFO BELOW.  CONTINUE OMEPRAZOLE.  YOUR BIOPSY RESULTS WILL BE AVAILABLE IN MY CHART FEB 10 AND MY OFFICE WILL CONTACT YOU IN 10-14 DAYS WITH YOUR RESULTS.   FOLLOW UP IN JUL 2017.  They will call you closer to time.    UPPER ENDOSCOPY AFTER CARE Read the instructions outlined below and refer to this sheet in the next week. These discharge instructions provide you with general information on caring for yourself after you leave the hospital. While your treatment has been planned according to the most current medical practices available, unavoidable complications occasionally occur. If you have any problems or questions after discharge, call DR. FIELDS, [phone].  ACTIVITY  You may resume your regular activity, but move at a slower pace for the next 24 hours.   Take frequent rest periods for the next 24 hours.   Walking will help get rid of the air and reduce the bloated feeling in your belly (abdomen).   No driving for 24 hours (because of the medicine (anesthesia) used during the test).   You may shower.   Do not sign any important legal documents or operate any machinery for 24 hours (because of the anesthesia used during the test).    NUTRITION  Drink plenty of fluids.   You may resume your normal diet as instructed by your doctor.   Begin with a light meal and progress to your normal diet. Heavy or fried foods are harder to digest and may make you feel sick to your stomach (nauseated).   Avoid alcoholic beverages for 24 hours or as instructed.    MEDICATIONS  You may resume your normal medications.   WHAT YOU CAN EXPECT TODAY  Some feelings of bloating in the abdomen.   Passage of more gas than usual.    IF YOU HAD A BIOPSY TAKEN  DURING THE UPPER ENDOSCOPY:  Eat a soft diet IF YOU HAVE NAUSEA, BLOATING, ABDOMINAL PAIN, OR VOMITING.    FINDING OUT THE RESULTS OF YOUR TEST Not all test results are available during your visit. DR. Oneida Alar WILL CALL YOU WITHIN 14 DAYS OF YOUR PROCEDUE WITH YOUR RESULTS. Do not assume everything is normal if you have not heard from DR. FIELDS IN 14 DAYS, CALL HER OFFICE AT [phone].  SEEK IMMEDIATE MEDICAL ATTENTION AND CALL THE OFFICE: [phone] IF:  You have more than a spotting of blood in your stool.   Your belly is swollen (abdominal distention).   You are nauseated or vomiting.   You have a temperature over 101F.   You have abdominal pain or discomfort that is severe or gets worse throughout the day.    Low-Fat Diet BREADS, CEREALS, PASTA, RICE, DRIED PEAS, AND BEANS These products are high in carbohydrates and most are low in fat. Therefore, they can be increased in the diet as substitutes for fatty foods. They too, however, contain calories and should not be eaten in excess. Cereals can be eaten for snacks as well as for breakfast.  Include foods that contain fiber (fruits, vegetables, whole grains, and legumes). Research shows that fiber may lower blood cholesterol levels, especially the water-soluble fiber found in fruits, vegetables, oat products, and legumes. FRUITS AND VEGETABLES It is good to  eat fruits and vegetables. Besides being sources of fiber, both are rich in vitamins and some minerals. They help you get the daily allowances of these nutrients. Fruits and vegetables can be used for snacks and desserts. MEATS Limit lean meat, chicken, [place], and fish to no more than 6 ounces per day. Beef, Pork, and Lamb Use lean cuts of beef, pork, and lamb. Lean cuts include:  Extra-lean ground beef.  Arm roast.  Sirloin tip.  Center-cut ham.  Round steak.  Loin chops.  Rump roast.  Tenderloin.  Trim all fat off the outside of meats before cooking. It is not  necessary to severely decrease the intake of red meat, but lean choices should be made. Lean meat is rich in protein and contains a highly absorbable form of iron. Premenopausal women, in particular, should avoid reducing lean red meat because this could increase the risk for low red blood cells (iron-deficiency anemia).  Chicken and [place] These are good sources of protein. The fat of poultry can be reduced by removing the skin and underlying fat layers before cooking. Chicken and [place] can be substituted for lean red meat in the diet. Poultry should not be fried or covered with high-fat sauces. Fish and Shellfish Fish is a good source of protein. Shellfish contain cholesterol, but they usually are low in saturated fatty acids. The preparation of fish is important. Like chicken and [place], they should not be fried or covered with high-fat sauces. EGGS Egg whites contain no fat or cholesterol. They can be eaten often. Try 1 to 2 egg whites instead of whole eggs in recipes or use egg substitutes that do not contain yolk.  MILK AND DAIRY PRODUCTS Use skim or 1% milk instead of 2% or whole milk. Decrease whole milk, natural, and processed cheeses. Use nonfat or low-fat (2%) cottage cheese or low-fat cheeses made from vegetable oils. Choose nonfat or low-fat (1 to 2%) yogurt. Experiment with evaporated skim milk in recipes that call for heavy cream. Substitute low-fat yogurt or low-fat cottage cheese for sour cream in dips and salad dressings. Have at least 2 servings of low-fat dairy products, such as 2 glasses of skim (or 1%) milk each day to help get your daily calcium intake.  FATS AND OILS Butterfat, lard, and beef fats are high in saturated fat and cholesterol. These should be avoided.Vegetable fats do not contain cholesterol. AVOID coconut oil, palm oil, and palm kernel oil, WHICH are very high in saturated fats. These should be limited. These fats are often used in bakery goods, processed foods,  popcorn, oils, and nondairy creamers. Vegetable shortenings and some peanut butters contain hydrogenated oils, which are also saturated fats. Read the labels on these foods and check for saturated vegetable oils.  Desirable liquid vegetable oils are corn oil, cottonseed oil, olive oil, canola oil, safflower oil, soybean oil, and sunflower oil. Peanut oil is not as good, but small amounts are acceptable. Buy a heart-healthy tub margarine that has no partially hydrogenated oils in the ingredients. AVOID Mayonnaise and salad dressings often are made from unsaturated fats.  OTHER EATING TIPS Snacks  Most sweets should be limited as snacks. They tend to be rich in calories and fats, and their caloric content outweighs their nutritional value. Some good choices in snacks are graham crackers, melba toast, soda crackers, bagels (no egg), English muffins, fruits, and vegetables. These snacks are preferable to snack crackers, [place] fries, and chips. Popcorn should be air-popped or cooked in small amounts of liquid  vegetable oil.  Desserts Eat fruit, low-fat yogurt, and fruit ices instead of pastries, cake, and cookies. Sherbet, angel food cake, gelatin dessert, frozen low-fat yogurt, or other frozen products that do not contain saturated fat (pure fruit juice bars, frozen ice pops) are also acceptable.   COOKING METHODS Choose those methods that use little or no fat. They include: Poaching.  Braising.  Steaming.  Grilling.  Baking.  Stir-frying.  Broiling.  Microwaving.  Foods can be cooked in a nonstick pan without added fat, or use a nonfat cooking spray in regular cookware. Limit fried foods and avoid frying in saturated fat. Add moisture to lean meats by using water, broth, cooking wines, and other nonfat or low-fat sauces along with the cooking methods mentioned above. Soups and stews should be chilled after cooking. The fat that forms on top after a few hours in the refrigerator should be skimmed  off. When preparing meals, avoid using excess salt. Salt can contribute to raising blood pressure in some people.  EATING AWAY FROM HOME Order entres, potatoes, and vegetables without sauces or butter. When meat exceeds the size of a deck of cards (3 to 4 ounces), the rest can be taken home for another meal. Choose vegetable or fruit salads and ask for low-calorie salad dressings to be served on the side. Use dressings sparingly. Limit high-fat toppings, such as bacon, crumbled eggs, cheese, sunflower seeds, and olives. Ask for heart-healthy tub margarine instead of butter.   PATIENT INSTRUCTIONS POST-ANESTHESIA  IMMEDIATELY FOLLOWING SURGERY:  Do not drive or operate machinery for the first twenty four hours after surgery.  Do not make any important decisions for twenty four hours after surgery or while taking narcotic pain medications or sedatives.  If you develop intractable nausea and vomiting or a severe headache please notify your doctor immediately.  FOLLOW-UP:  Please make an appointment with your surgeon as instructed. You do not need to follow up with anesthesia unless specifically instructed to do so.  WOUND CARE INSTRUCTIONS (if applicable):  Keep a dry clean dressing on the anesthesia/puncture wound site if there is drainage.  Once the wound has quit draining you may leave it open to air.  Generally you should leave the bandage intact for twenty four hours unless there is drainage.  If the epidural site drains for more than 36-48 hours please call the anesthesia department.  QUESTIONS?:  Please feel free to call your physician or the hospital operator if you have any questions, and they will be happy to assist you.

## 2015-10-08 NOTE — Anesthesia Postprocedure Evaluation (Signed)
Anesthesia Post Note  Patient: [name]  Procedure(s) Performed: Procedure(s) (LRB): ESOPHAGOGASTRODUODENOSCOPY (EGD) WITH PROPOFOL (N/A)  Patient location during evaluation: PACU Anesthesia Type: MAC Level of consciousness: awake and alert Pain management: pain level controlled Vital Signs Assessment: post-procedure vital signs reviewed and stable Respiratory status: spontaneous breathing Cardiovascular status: blood pressure returned to baseline Postop Assessment: no signs of nausea or vomiting Anesthetic complications: no    Last Vitals:  Filed Vitals:   10/08/15 1145 10/08/15 1155  BP: 118/82 139/88  Pulse:  79  Temp:  36.4 C  Resp: 16     Last Pain: There were no vitals filed for this visit.               Dvaughn Fickle

## 2015-10-08 NOTE — Anesthesia Preprocedure Evaluation (Signed)
Anesthesia Evaluation  Patient identified by MRN, date of birth, ID band Patient awake    Reviewed: Allergy & Precautions, H&P , NPO status , Patient's Chart, lab work & pertinent test results, reviewed documented beta blocker date and time   Airway Mallampati: II  TM Distance: >3 FB     Dental  (+) Teeth Intact, Missing   Pulmonary former smoker,    breath sounds clear to auscultation       Cardiovascular hypertension, Pt. on medications and Pt. on home beta blockers  Rhythm:Regular Rate:Normal     Neuro/Psych    GI/Hepatic PUD, GERD  ,(+)     substance abuse  alcohol use,   Endo/Other  Hypothyroidism Hx pancreatitis  Renal/GU      Musculoskeletal  (+) Arthritis ,   Abdominal   Peds  Hematology  (+) anemia ,   Anesthesia Other Findings   Reproductive/Obstetrics                             Anesthesia Physical Anesthesia Plan  ASA: III  Anesthesia Plan: MAC   Post-op Pain Management:    Induction: Intravenous  Airway Management Planned: Simple Face Mask  Additional Equipment:   Intra-op Plan:   Post-operative Plan:   Informed Consent: I have reviewed the patients History and Physical, chart, labs and discussed the procedure including the risks, benefits and alternatives for the proposed anesthesia with the patient or authorized representative who has indicated his/her understanding and acceptance.     Plan Discussed with:   Anesthesia Plan Comments:         Anesthesia Quick Evaluation

## 2015-10-08 NOTE — Transfer of Care (Signed)
Immediate Anesthesia Transfer of Care Note  Patient: [name]  Procedure(s) Performed: Procedure(s) with comments: ESOPHAGOGASTRODUODENOSCOPY (EGD) WITH PROPOFOL (N/A) - 0945  Patient Location: PACU  Anesthesia Type:MAC  Level of Consciousness: awake and alert   Airway & Oxygen Therapy: Patient Spontanous Breathing and Patient connected to nasal cannula oxygen  Post-op Assessment: Report given to RN and Post -op Vital signs reviewed and stable  Post vital signs: Reviewed and stable  Last Vitals:  Filed Vitals:   10/08/15 1000 10/08/15 1015  BP: 131/88 126/81  Temp:    Resp: 11 20    Complications: No apparent anesthesia complications

## 2015-10-08 NOTE — H&P (View-Only) (Signed)
Subjective:    Patient ID: [name], male    DOB: July 19, 1951, 65 y.o.   MRN: [id number]  [name], MD  HPI PT CONCERNED ABOUT HAVING ANOTHER TCS. SAW ONCOLOGY YESTERDAY. NEED REPEAT EGD. No ADDITIONAL questions or concerns. LIKES TO WATCH DAYTIME SOAP OPERAS: VICTOR NEWMAN. PT DENIES FEVER, CHILLS, HEMATOCHEZIA, HEMATEMESIS, nausea, vomiting, melena, diarrhea, CHEST PAIN, SHORTNESS OF BREATH,  CHANGE IN BOWEL IN HABITS, constipation, abdominal pain, problems with sedation, OR heartburn or indigestion.   Past Medical History  Diagnosis Date  . Diverticulosis 2008 LGIB  . Gout   . HTN (hypertension)   . History of septic arthritis   . History of alcohol abuse   . Anemia FeDA: GASTRIC POLYPS, B12    TCS 2008 EGD 2009, 2008-HB 11.1 MCV 83.6 CR 1.22, 2009 FERRITIN 102-22  . Hepatomegaly 2o to FATTY LIVER DZ  . Chronic knee pain   . B12 deficiency   . Hypothyroidism   . DIVERTICULOSIS, COLON 08/15/2006    Qualifier: Diagnosis of  By: Jonna Munro MD, Roderic Scarce    . PUD 11/30/2008    Qualifier: History of  By: Jonna Munro MD, Roderic Scarce    . Carcinoid tumor determined by biopsy of stomach     Past Surgical History  Procedure Laterality Date  . Upper gastrointestinal endoscopy  APR 2009    INFLAMED HYPERPLASTIC POLYPS, CHRONIC GASTRITIS  . Colonoscopy  2008 [facility] DJ    LGIB 2o to TICS, prep good  . Replacement total knee bilateral    . Hand surgery    . Cholecystectomy    . Esophagogastroduodenoscopy  12/08/2007    [id number] gastric polyps seen in the cardia and body of the stomach/Normal esophagus without evidence of Barrett's, mass, erosion/Normal duodenal bulb and second portion of the duodenum. Benign bx.  . Colonoscopy with propofol N/A 08/14/2014    SLF: 1. Four large colon polyps removed. 2. The left colon is redundant 3. Moderate diverticulosis throughout teh entire examined colon  . Esophagogastroduodenoscopy (egd) with propofol N/A 08/14/2014    SLF: 1. Heme postive  stools due to gastric and colon polyps 2. Multiple gastric   . Polypectomy  08/14/2014    Procedure: POLYPECTOMY;  Surgeon: Danie Binder, MD;  Location: [facility];  Service: Endoscopy;;  . Esophageal biopsy  08/14/2014    Procedure: BIOPSY;  Surgeon: Danie Binder, MD;  Location: [facility];  Service: Endoscopy;;  . Colonoscopy with propofol N/A 11/06/2014    SLF: 8 small polyps removed. One large pedunculated polyp removed from the ascending colon, tubulovillous and tubular adenomas. Next colonoscopy March 2019  . Polypectomy N/A 11/06/2014    Procedure: POLYPECTOMY;  Surgeon: Danie Binder, MD;  Location: [facility];  Service: Endoscopy;  Laterality: N/A;  Transverse Colon x3, Ascending Colon x2, Descending Colon x3  . Esophagogastroduodenoscopy (egd) with propofol N/A 03/12/2015    SLF: Multiple gastric nodules seen in gasric body/antrum. 2. Non-erosive gastritis ( inflammation) was found in the gastric antrum.   . Esophageal biopsy  03/12/2015    Procedure: BIOPSY;  Surgeon: Danie Binder, MD;  Location: [facility];  Service: Endoscopy;;    Allergies  Allergen Reactions  . Aspirin     REACTION: Diverticular Bleed   Current Outpatient Prescriptions  Medication Sig Dispense Refill  . acetaminophen (TYLENOL) 500 MG tablet Take 500 mg by mouth every 6 (six) hours as needed for moderate pain.    Marland Kitchen amLODipine (NORVASC) 10 MG tablet Take 10 mg by mouth daily.    Marland Kitchen  cyanocobalamin (,VITAMIN B-12,) 1000 MCG/ML injection Inject 1,000 mcg into the muscle every 30 (thirty) days.      . febuxostat (ULORIC) 40 MG tablet Take 40 mg by mouth daily.     Marland Kitchen levothyroxine (SYNTHROID, LEVOTHROID) 175 MCG tablet Take 175 mcg by mouth daily before breakfast.     . metoprolol succinate (TOPROL-XL) 25 MG 24 hr tablet Take 25 mg by mouth 2 (two) times daily.     Marland Kitchen omeprazole (PRILOSEC) 20 MG capsule     . tamsulosin (FLOMAX) 0.4 MG CAPS capsule Take 0.4 mg by mouth daily.    . cyclobenzaprine (FLEXERIL) 10 MG tablet Take 1  tablet (10 mg total) by mouth 3 (three) times daily as needed for muscle spasms. (Patient not taking: Reported on 09/12/2015)    .       Review of Systems PER HPI OTHERWISE ALL SYSTEMS ARE NEGATIVE.    Objective:   Physical Exam  Constitutional: He is oriented to person, place, and time. He appears well-developed and well-nourished. No distress.  HENT:  Head: Normocephalic and atraumatic.  Mouth/Throat: Oropharynx is clear and moist. No oropharyngeal exudate.  Eyes: Pupils are equal, round, and reactive to light. No scleral icterus.  Neck: Normal range of motion. Neck supple.  Cardiovascular: Normal rate, regular rhythm and normal heart sounds.   Pulmonary/Chest: Effort normal and breath sounds normal. No respiratory distress.  Abdominal: Soft. Bowel sounds are normal. He exhibits no distension. There is no tenderness.  Musculoskeletal: He exhibits no edema.  Lymphadenopathy:    He has no cervical adenopathy.  Neurological: He is alert and oriented to person, place, and time.  NO FOCAL DEFICITS  Psychiatric: He has a normal mood and affect.  Vitals reviewed.     Assessment & Plan:

## 2015-10-11 ENCOUNTER — Encounter (HOSPITAL_COMMUNITY): Payer: Self-pay | Admitting: Gastroenterology

## 2015-10-21 ENCOUNTER — Telehealth: Payer: Self-pay | Admitting: Gastroenterology

## 2015-10-21 NOTE — Telephone Encounter (Signed)
PLEASE CALL PT. HE HAS CARCINOID IN HIS STOMACH. IT IS THE REASON FOR HIS LOW IRON. I SPOKE TO TOM HE WILL CALL YOU FOR A FOLLOW UP VISIT.

## 2015-10-22 NOTE — Telephone Encounter (Signed)
LMOM to call.

## 2015-10-22 NOTE — Telephone Encounter (Signed)
Pt is aware.  

## 2015-10-28 ENCOUNTER — Other Ambulatory Visit: Payer: Self-pay | Admitting: *Deleted

## 2015-10-28 ENCOUNTER — Telehealth: Payer: Self-pay | Admitting: Orthopedic Surgery

## 2015-10-28 DIAGNOSIS — G8929 Other chronic pain: Secondary | ICD-10-CM

## 2015-10-28 MED ORDER — HYDROCODONE-ACETAMINOPHEN 5-325 MG PO TABS: 1.0000 | ORAL_TABLET | ORAL | Status: DC | PRN

## 2015-10-28 NOTE — Telephone Encounter (Signed)
Prescription available, patient aware  

## 2015-10-28 NOTE — Telephone Encounter (Signed)
Patient requests refill on Hydrocodone 5-325 mgs.

## 2015-12-02 ENCOUNTER — Telehealth: Payer: Self-pay | Admitting: Orthopedic Surgery

## 2015-12-02 NOTE — Telephone Encounter (Signed)
Patient called and requested a refill on Hydrocodone (Norco)  5.-325 mgs.   Qty 180             Sig: Take 1 tablet by mouth every 4 (four) hours as needed.

## 2015-12-02 NOTE — Telephone Encounter (Signed)
Routing to Dr Harrison 

## 2015-12-03 ENCOUNTER — Other Ambulatory Visit: Payer: Self-pay | Admitting: Orthopedic Surgery

## 2015-12-03 DIAGNOSIS — G8929 Other chronic pain: Secondary | ICD-10-CM

## 2015-12-03 MED ORDER — HYDROCODONE-ACETAMINOPHEN 5-325 MG PO TABS: 1.0000 | ORAL_TABLET | ORAL | Status: DC | PRN

## 2015-12-03 NOTE — Telephone Encounter (Signed)
ready

## 2016-01-06 ENCOUNTER — Telehealth: Payer: Self-pay | Admitting: Orthopedic Surgery

## 2016-01-06 ENCOUNTER — Other Ambulatory Visit: Payer: Self-pay | Admitting: *Deleted

## 2016-01-06 DIAGNOSIS — G8929 Other chronic pain: Secondary | ICD-10-CM

## 2016-01-06 MED ORDER — HYDROCODONE-ACETAMINOPHEN 5-325 MG PO TABS: 1.0000 | ORAL_TABLET | ORAL | Status: DC | PRN

## 2016-01-06 NOTE — Telephone Encounter (Signed)
Hydrocodone-Acetaminophen  5/325mg   Qty 180 Tablets  Take 1 tablet by mouth every 4 (four) hours as needed.

## 2016-01-10 ENCOUNTER — Ambulatory Visit (HOSPITAL_COMMUNITY): Payer: Medicaid Other | Admitting: Oncology

## 2016-01-10 ENCOUNTER — Other Ambulatory Visit (HOSPITAL_COMMUNITY): Payer: Medicaid Other

## 2016-01-10 NOTE — Assessment & Plan Note (Deleted)
Pleasant 65 year old male with a history of alcohol abuse, fatty liver, he underwent upper endoscopy with Dr. Oneida Alar. He had a gastric polyp that was removed that showed pathology consistent with a low-grade neuroendocrine tumor. In addition he had a biopsy of the stomach that showed focal involvement by low-grade neuroendocrine tumor. Based upon his pathology, there is concern of residual disease or more extensive disease within his stomach.  Octreotide scan on 09/19/2014 was negative for any avid metastasis.  His case was presented to GI tumor board to help further solidify recommendations.     He is S/P EGD and biopsy on 10/08/2015 by Dr. Oneida Alar.  Pathology demonstrates carcinoid in a stomach poly, well-differentiated.  I personally reviewed and went over pathology results with the patient.  Labs today: CBC diff, iron/TIBC, ferritin, CMET, serum serotonin, chromogranin A.  Labs in 4 months: CBC diff, CMET, serum serotonin, chromogranin A.  Return in 4 months for follow-up.  Sooner if necessary depending on EGD results upcoming with Dr. Oneida Alar.

## 2016-01-10 NOTE — Progress Notes (Signed)
-  NO SHOW-  ROS

## 2016-01-28 ENCOUNTER — Ambulatory Visit (INDEPENDENT_AMBULATORY_CARE_PROVIDER_SITE_OTHER): Payer: Medicare Other

## 2016-01-28 ENCOUNTER — Ambulatory Visit (INDEPENDENT_AMBULATORY_CARE_PROVIDER_SITE_OTHER): Payer: Medicare Other | Admitting: Orthopedic Surgery

## 2016-01-28 VITALS — Ht 73.0 in | Wt 248.0 lb

## 2016-01-28 DIAGNOSIS — Z96651 Presence of right artificial knee joint: Secondary | ICD-10-CM

## 2016-01-28 DIAGNOSIS — Z96652 Presence of left artificial knee joint: Secondary | ICD-10-CM

## 2016-01-28 NOTE — Progress Notes (Signed)
Patient ID: [name], male   DOB: 13-Apr-1951, 65 y.o.   MRN: [id number]  Chief Complaint  Patient presents with  . Follow-up    one year follow up bilateral TKA Right DOS 09/18/2008 left 06/29/2006    HPI RIGHT AND LEFT KNEE NO COMPLAINTS  HE HAD A LEFT TKA AND A RIGHT TKA  Review of Systems  Constitutional: Negative for fever, chills and weight loss.  Musculoskeletal: Negative for back pain.  Neurological: Negative for tremors and sensory change.    Past Medical History  Diagnosis Date  . Diverticulosis 2008 LGIB  . Gout   . HTN (hypertension)   . History of septic arthritis   . History of alcohol abuse   . Anemia FeDA: GASTRIC POLYPS, B12    TCS 2008 EGD 2009, 2008-HB 11.1 MCV 83.6 CR 1.22, 2009 FERRITIN 102-22  . Hepatomegaly 2o to FATTY LIVER DZ  . Chronic knee pain   . B12 deficiency   . Hypothyroidism   . DIVERTICULOSIS, COLON 08/15/2006    Qualifier: Diagnosis of  By: Jonna Munro MD, Roderic Scarce    . PUD 11/30/2008    Qualifier: History of  By: Jonna Munro MD, Roderic Scarce    . Carcinoid tumor determined by biopsy of stomach     Ht 6\' 1"  (1.854 m)  Wt 248 lb (112.492 kg)  BMI 32.73 kg/m2  Physical Exam Physical Exam  Constitutional: The patient appears well-developed and well-nourished. No distress.  The patient is oriented to person, place, and time.  Psychiatric: The patient has a normal mood and affect.  Cardiovascular: Intact distal pulses.   Neurological: sensation is normal  Skin: Skin is warm and dry. No rash noted. The patient is not diaphoretic. No erythema. No pallor.    Ortho Exam RIGHT LEG KNEE NO TENDER ROM =115 MOTOR NORMAL QUAD STRENGTH STABILITY NORMAL DRAWER  SKIN INCX NORMAL   LEFT KNEE  NO SWELLING , NO TENDERNESS 2 SCARS BOTH HEALED NORMALLY ROM =110 5/5 EXTENSION STRENGTH STABLE DRAWER  ASSESSMENT AND PLAN   BOTH KNEE ARE UNCHANGED LOOKING AT THE PRIOR FILMS AS WELL THE LEFT TIBIAL TRAY IS IN STABLE VARUS POSITION  I VE DICTATED A  REPORT ON THE XRAYS   B/L TKA STABLE  RETURN 1 YR FOR XRAYS

## 2016-02-04 ENCOUNTER — Telehealth: Payer: Self-pay | Admitting: Internal Medicine

## 2016-02-04 ENCOUNTER — Encounter (HOSPITAL_COMMUNITY): Payer: Self-pay | Admitting: Oncology

## 2016-02-04 ENCOUNTER — Encounter (HOSPITAL_COMMUNITY): Payer: Medicare Other | Attending: Oncology | Admitting: Oncology

## 2016-02-04 ENCOUNTER — Encounter (HOSPITAL_COMMUNITY): Payer: Medicare Other

## 2016-02-04 VITALS — BP 120/87 | HR 80 | Temp 98.1°F | Resp 16 | Wt 254.2 lb

## 2016-02-04 DIAGNOSIS — Z9889 Other specified postprocedural states: Secondary | ICD-10-CM | POA: Diagnosis not present

## 2016-02-04 DIAGNOSIS — M25569 Pain in unspecified knee: Secondary | ICD-10-CM | POA: Diagnosis not present

## 2016-02-04 DIAGNOSIS — E538 Deficiency of other specified B group vitamins: Secondary | ICD-10-CM | POA: Diagnosis not present

## 2016-02-04 DIAGNOSIS — D509 Iron deficiency anemia, unspecified: Secondary | ICD-10-CM | POA: Diagnosis not present

## 2016-02-04 DIAGNOSIS — I10 Essential (primary) hypertension: Secondary | ICD-10-CM | POA: Diagnosis not present

## 2016-02-04 DIAGNOSIS — Z8711 Personal history of peptic ulcer disease: Secondary | ICD-10-CM | POA: Insufficient documentation

## 2016-02-04 DIAGNOSIS — M109 Gout, unspecified: Secondary | ICD-10-CM | POA: Diagnosis not present

## 2016-02-04 DIAGNOSIS — D3A8 Other benign neuroendocrine tumors: Secondary | ICD-10-CM

## 2016-02-04 DIAGNOSIS — D490 Neoplasm of unspecified behavior of digestive system: Secondary | ICD-10-CM | POA: Insufficient documentation

## 2016-02-04 DIAGNOSIS — C7A092 Malignant carcinoid tumor of the stomach: Secondary | ICD-10-CM | POA: Diagnosis not present

## 2016-02-04 DIAGNOSIS — E039 Hypothyroidism, unspecified: Secondary | ICD-10-CM | POA: Insufficient documentation

## 2016-02-04 DIAGNOSIS — G8929 Other chronic pain: Secondary | ICD-10-CM | POA: Insufficient documentation

## 2016-02-04 LAB — CBC WITH DIFFERENTIAL/PLATELET
BASOS PCT: 1 %
Basophils Absolute: 0 10*3/uL (ref 0.0–0.1)
EOS ABS: 0.2 10*3/uL (ref 0.0–0.7)
Eosinophils Relative: 3 %
HCT: 43.5 % (ref 39.0–52.0)
Hemoglobin: 13.6 g/dL (ref 13.0–17.0)
LYMPHS ABS: 2.1 10*3/uL (ref 0.7–4.0)
Lymphocytes Relative: 38 %
MCH: 28 pg (ref 26.0–34.0)
MCHC: 31.3 g/dL (ref 30.0–36.0)
MCV: 89.5 fL (ref 78.0–100.0)
MONO ABS: 0.5 10*3/uL (ref 0.1–1.0)
MONOS PCT: 10 %
Neutro Abs: 2.7 10*3/uL (ref 1.7–7.7)
Neutrophils Relative %: 48 %
Platelets: 173 10*3/uL (ref 150–400)
RBC: 4.86 MIL/uL (ref 4.22–5.81)
RDW: 14 % (ref 11.5–15.5)
WBC: 5.6 10*3/uL (ref 4.0–10.5)

## 2016-02-04 LAB — IRON AND TIBC
Iron: 62 ug/dL (ref 45–182)
Saturation Ratios: 15 % — ABNORMAL LOW (ref 17.9–39.5)
TIBC: 406 ug/dL (ref 250–450)
UIBC: 344 ug/dL

## 2016-02-04 LAB — COMPREHENSIVE METABOLIC PANEL
ALBUMIN: 3.9 g/dL (ref 3.5–5.0)
ALK PHOS: 68 U/L (ref 38–126)
ALT: 22 U/L (ref 17–63)
AST: 33 U/L (ref 15–41)
Anion gap: 6 (ref 5–15)
BILIRUBIN TOTAL: 0.7 mg/dL (ref 0.3–1.2)
BUN: 19 mg/dL (ref 6–20)
CALCIUM: 9 mg/dL (ref 8.9–10.3)
CO2: 26 mmol/L (ref 22–32)
CREATININE: 1.37 mg/dL — AB (ref 0.61–1.24)
Chloride: 107 mmol/L (ref 101–111)
GFR calc non Af Amer: 52 mL/min — ABNORMAL LOW (ref >60)
GLUCOSE: 122 mg/dL — AB (ref 65–99)
Potassium: 3.5 mmol/L (ref 3.5–5.1)
SODIUM: 139 mmol/L (ref 135–145)
TOTAL PROTEIN: 8.2 g/dL — AB (ref 6.5–8.1)

## 2016-02-04 LAB — FERRITIN: Ferritin: 17 ng/mL — ABNORMAL LOW (ref 24–336)

## 2016-02-04 MED ORDER — POLYSACCHAR IRON-FA-B12 150-1-25 MG-MG-MCG PO CAPS: 1.0000 | ORAL_CAPSULE | Freq: Every day | ORAL | Status: DC

## 2016-02-04 NOTE — Assessment & Plan Note (Addendum)
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  Octreotide scan on 09/19/2014 was negative for any avid metastasis.  His case was discussed at GI tumor board in Feb/March 2016 and recommendation was for anatomical biopsies.  Unfortunately, this patient may need gastric resection and without anatomical biopsies, guidance regarding resection is limited/nonexistent.  The patient is interested in a second opinion from a GI perspective.  He notes that he is concerned regarding his repeat positive biopsy.  He is advised that I will discuss his case with Dr. Carlean Purl as he is familiar with the patient's case as he was in attendance at tumor board.  Dr. Carlean Purl was nice enough to talk to me regarding the patient.  He notes that he would be happy to help out with this patient's care in a second opinion role as the patient requests.  HOWEVER, patient reports that he cannot get to [facility] for consultation.    Labs in 6 weeks: CBC diff, CMET, serum serotonin, chromogranin A.  I will message Dr. Oneida Alar.  This patient needs anatomical gastric biopsies to help guide the role of resection moving forward.  Return in 6 weeks for follow-up.

## 2016-02-04 NOTE — Progress Notes (Addendum)
Philip Gravel, King [address] [place] [address]  Neuroendocrine tumor - Plan: CBC with Differential, Comprehensive metabolic panel  ANEMIA, IRON DEFICIENCY - Plan: CBC with Differential, Iron and TIBC, Ferritin, Polysacchar Iron-FA-B12 (FERREX 150 FORTE) 150-1-25 MG-MG-MCG CAPS, Chromogranin A, Serotonin serum  B12 deficiency - Plan: Vitamin B12  CURRENT THERAPY: Surveillance  INTERVAL HISTORY: [name] 65 y.o. male returns for followup of well-differentiated neuroendocrine carcinoid tumor on polypectomy by Dr. Oneida Alar on 08/14/2014 and 10/08/2015 and negative octreotide scan on 09/19/2014.  His case was presented at GI tumor board in Feb/March 2016 with the recommendation of anatomic biopsies to guide role of surgical resection.  To date, this has not been done.  I personally reviewed and went over laboratory results with the patient.  The results are noted within this dictation.  Labs today demonstrate a normal HGB, platelet, and WBC.  HOWEVER, his ferritin remains low.  He notes that he used to take ferrous sulfate but had poor tolerance to this.  I personally reviewed and went over radiographic studies with the patient.  The results are noted within this dictation.  Old imaging from 2016 was negative for avid metastasis on Octreotide scan.  I personally reviewed and went over pathology results with the patient.  Pathology from Feb 2017 was positive for well-differentiated carcinoid tumor in another polyp.   Review of Systems  Constitutional: Negative.   HENT: Negative.   Eyes: Negative.   Respiratory: Negative.   Cardiovascular: Negative.   Gastrointestinal: Negative.   Genitourinary: Negative.   Musculoskeletal: Negative.   Skin: Negative.   Neurological: Negative.   Endo/Heme/Allergies: Negative.   Psychiatric/Behavioral: Negative.     Past Medical History  Diagnosis Date  . Diverticulosis 2008 LGIB  . Gout   . HTN (hypertension)   .  History of septic arthritis   . History of alcohol abuse   . Anemia FeDA: GASTRIC POLYPS, B12    TCS 2008 EGD 2009, 2008-HB 11.1 MCV 83.6 CR 1.22, 2009 FERRITIN 102-22  . Hepatomegaly 2o to FATTY LIVER DZ  . Chronic knee pain   . B12 deficiency   . Hypothyroidism   . DIVERTICULOSIS, COLON 08/15/2006    Qualifier: Diagnosis of  By: [name], [name]    . PUD 11/30/2008    Qualifier: History of  By: [name], [name]    . Carcinoid tumor determined by biopsy of stomach   . Neuroendocrine tumor 09/25/2014    Past Surgical History  Procedure Laterality Date  . Upper gastrointestinal endoscopy  APR 2009    INFLAMED HYPERPLASTIC POLYPS, CHRONIC GASTRITIS  . Colonoscopy  2008 [facility] DJ    LGIB 2o to TICS, prep good  . Hand surgery    . Cholecystectomy    . Esophagogastroduodenoscopy  12/08/2007    [id number] gastric polyps seen in the cardia and body of the stomach/Normal esophagus without evidence of Barrett's, mass, erosion/Normal duodenal bulb and second portion of the duodenum. Benign bx.  . Colonoscopy with propofol N/A 08/14/2014    SLF: 1. Four large colon polyps removed. 2. The left colon is redundant 3. Moderate diverticulosis throughout teh entire examined colon  . Esophagogastroduodenoscopy (egd) with propofol N/A 08/14/2014    SLF: 1. Heme postive stools due to gastric and colon polyps 2. Multiple gastric   . Polypectomy  08/14/2014    Procedure: POLYPECTOMY;  Surgeon: Philip Binder, King;  Location: [facility];  Service: Endoscopy;;  . Biopsy  08/14/2014  Procedure: BIOPSY;  Surgeon: Philip Binder, King;  Location: [facility];  Service: Endoscopy;;  . Colonoscopy with propofol N/A 11/06/2014    SLF: 8 small polyps removed. One large pedunculated polyp removed from the ascending colon, tubulovillous and tubular adenomas. Next colonoscopy March 2019  . Polypectomy N/A 11/06/2014    Procedure: POLYPECTOMY;  Surgeon: Philip Binder, King;  Location: [facility];  Service: Endoscopy;   Laterality: N/A;  Transverse Colon x3, Ascending Colon x2, Descending Colon x3  . Esophagogastroduodenoscopy (egd) with propofol N/A 03/12/2015    SLF: Multiple gastric nodules seen in gasric body/antrum. 2. Non-erosive gastritis ( inflammation) was found in the gastric antrum.   Marland Kitchen Biopsy  03/12/2015    Procedure: BIOPSY;  Surgeon: Philip Binder, King;  Location: [facility];  Service: Endoscopy;;  . Replacement total knee bilateral    . Esophagogastroduodenoscopy (egd) with propofol N/A 10/08/2015    Procedure: ESOPHAGOGASTRODUODENOSCOPY (EGD) WITH PROPOFOL;  Surgeon: Philip Binder, King;  Location: [facility];  Service: Endoscopy;  Laterality: N/A;  0945    Family History  Problem Relation Age of Onset  . Colon polyps Neg Hx   . Colon cancer Neg Hx   . Pancreatic disease Neg Hx   . Hypertension Sister   . Hypertension Brother   . Diabetes Brother     Social History   Social History  . Marital Status: Single    Spouse Name: N/A  . Number of Children: 0  . Years of Education: N/A   Occupational History  . retired    Social History Main Topics  . Smoking status: Never Smoker   . Smokeless tobacco: Never Used     Comment: Quit x 10 years/ never smoked on regular basis  . Alcohol Use: 1.2 oz/week    2 Shots of liquor per week     Comment: drinks on weekends, gin/vodka 1/5th.  . Drug Use: No  . Sexual Activity: No   Other Topics Concern  . None   Social History Narrative   HE DOES NOT HAVE ANY CHILDREN.     PHYSICAL EXAMINATION  ECOG PERFORMANCE STATUS: 1 - Symptomatic but completely ambulatory  Filed Vitals:   02/04/16 1354  BP: 120/87  Pulse: 80  Temp: 98.1 F (36.7 C)  Resp: 16    GENERAL:alert, well nourished, well developed, comfortable, cooperative, smiling and unaccompanied SKIN: skin color, texture, turgor are normal, no rashes or significant lesions HEAD: Normocephalic, No masses, lesions, tenderness or abnormalities EYES: normal, EOMI, Conjunctiva are  pink and non-injected EARS: External ears normal OROPHARYNX:lips, buccal mucosa, and tongue normal and mucous membranes are moist  NECK: supple, trachea midline LYMPH:  no palpable lymphadenopathy BREAST:not examined LUNGS: clear to auscultation  HEART: regular rate & rhythm ABDOMEN:abdomen soft, non-tender, obese and normal bowel sounds BACK: Back symmetric, no curvature. EXTREMITIES:less then 2 second capillary refill, no joint deformities, effusion, or inflammation, no skin discoloration, no cyanosis  NEURO: alert & oriented x 3 with fluent speech, no focal motor/sensory deficits, gait normal   LABORATORY DATA: CBC    Component Value Date/Time   WBC 5.6 02/04/2016 0937   RBC 4.86 02/04/2016 0937   RBC 3.20* 03/17/2007 0405   HGB 13.6 02/04/2016 0937   HCT 43.5 02/04/2016 0937   HCT 44 01/05/2013   PLT 173 02/04/2016 0937   PLT 148 01/05/2013   MCV 89.5 02/04/2016 0937   MCV 90.4 01/05/2013   MCH 28.0 02/04/2016 0937   MCHC 31.3 02/04/2016 0937  RDW 14.0 02/04/2016 0937   LYMPHSABS 2.1 02/04/2016 0937   MONOABS 0.5 02/04/2016 0937   EOSABS 0.2 02/04/2016 0937   BASOSABS 0.0 02/04/2016 0937      Chemistry      Component Value Date/Time   NA 139 02/04/2016 0937   K 3.5 02/04/2016 0937   CL 107 02/04/2016 0937   CO2 26 02/04/2016 0937   BUN 19 02/04/2016 0937   CREATININE 1.37* 02/04/2016 0937   CREATININE 1.30 02/24/2012 1023      Component Value Date/Time   CALCIUM 9.0 02/04/2016 0937   ALKPHOS 68 02/04/2016 0937   AST 33 02/04/2016 0937   ALT 22 02/04/2016 0937   BILITOT 0.7 02/04/2016 0937     Lab Results  Component Value Date   IRON 62 02/04/2016   TIBC 406 02/04/2016   FERRITIN 17* 02/04/2016     PENDING LABS:   RADIOGRAPHIC STUDIES:  Dg Knee Ap/lat W/sunrise Left  02/03/2016  Radiology report 3 views left knee AP lateral and patellar sunrise views of the knee FINDINGS: A total knee prosthesis is noted. No evidence of loosening is noted.  There is a staple from pre-knee replacement surgery for medial collateral ligament injury. His tibial tray slightly tilted in varus. Patella is slightly tilted Compared to x-ray noted in 2016 there is been no change Impression : Stable left TKA  xray   Dg Knee Ap/lat W/sunrise Right  02/03/2016  DG KNEE AP/LAT W/ SUNRISE RIGHT Radiology report 3 views RIGHT knee AP lateral and patellar sunrise views of the knee FINDINGS: A total knee prosthesis is noted. It is in anatomic alignment. There is no evidence of loosening. 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  Octreotide scan on 09/19/2014 was negative for any avid metastasis.  His case was discussed at GI tumor board in Feb/March 2016 and recommendation was for anatomical biopsies.  Unfortunately, this patient may need gastric resection and without anatomical biopsies, guidance regarding resection is limited/nonexistent.  The patient is interested in a second opinion from a GI perspective.  He notes that he is concerned regarding his repeat positive biopsy.  He is advised that I will discuss his case with Dr. Carlean Purl as he is familiar with the patient's case as he was in attendance at tumor board.  Dr. Carlean Purl was nice enough to talk to me regarding the patient.  He notes that he would be happy to help out with this patient's care in a second opinion role as the patient requests.  HOWEVER, patient reports that he cannot get to [facility] for consultation.    Labs in 6 weeks: CBC diff, CMET, serum serotonin, chromogranin A.  I will message Dr. Oneida Alar.  This patient needs anatomical gastric biopsies to help guide the role of resection moving forward.  Return in 6 weeks for follow-up.  ANEMIA, IRON DEFICIENCY Iron deficiency without anemia.  Ferritin remains in the  teens.  S/P ferric gluconate infusion without any long-term improvement:  Oncology Flowsheet 09/06/2015  ferric gluconate (NULECIT) IV 125 mg   Rx for ferrex forte escribed to [facility].    Labs in 6 weeks: CBC diff, iron/TIBC, ferritin.    ORDERS PLACED FOR THIS ENCOUNTER: Orders Placed This Encounter  Procedures  . CBC with Differential  . Comprehensive metabolic panel  .  Iron and TIBC  . Ferritin  . Vitamin B12  . Chromogranin A  . Serotonin serum    MEDICATIONS PRESCRIBED THIS ENCOUNTER: Meds ordered this encounter  Medications  . Polysacchar Iron-FA-B12 (FERREX 150 FORTE) 150-1-25 MG-MG-MCG CAPS    Sig: Take 1 capsule by mouth daily.    Dispense:  30 capsule    Refill:  3    Please deliver to patient on 02/05/2016    Order Specific Question:  Supervising Provider    Answer:  Patrici Ranks [id number]    THERAPY PLAN:  Ferrex forte escribed for iron deficiency WITHOUT anemia.  Further GI work-up is necessary.    All questions were answered. The patient knows to call the clinic with any problems, questions or concerns. We can certainly see the patient much sooner if necessary.  Patient and plan discussed with Dr. Ancil Linsey and she is in agreement with the aforementioned.   This note is electronically signed by: Doy Mince 02/07/2016 6:25 PM

## 2016-02-04 NOTE — Telephone Encounter (Signed)
Asking for a second opinion and help with anatomical biopsies and mapping of the neuroendocrine tumor which they are not getting help with from existing gastroenterologist.  I agreed to see patient  Needs a July appointment with me

## 2016-02-04 NOTE — Assessment & Plan Note (Signed)
Iron deficiency without anemia.  Ferritin remains in the teens.  S/P ferric gluconate infusion without any long-term improvement:  Oncology Flowsheet 09/06/2015  ferric gluconate (NULECIT) IV 125 mg   Rx for ferrex forte escribed to [facility].    Labs in 6 weeks: CBC diff, iron/TIBC, ferritin.

## 2016-02-04 NOTE — Patient Instructions (Addendum)
[place] [facility]  RECOMMENDATIONS MADE BY THE CONSULTANT AND ANY TEST RESULTS WILL BE SENT TO YOUR REFERRING PHYSICIAN.  Exam done and seen today by [facility] in 6 weeks Return to see the Doctor in 6 weeks Call the clinic for any concerns or questions.    Thank you for choosing [place] at [facility] to provide your oncology and hematology care.  To afford each patient quality time with our provider, please arrive at least 15 minutes before your scheduled appointment time.   Beginning January 23rd 2017 lab work for the [facility] will be done in the  Main lab at [facility] on 1st floor. If you have a lab appointment with the [facility] please come in thru the  Main Entrance and check in at the main information desk  You need to re-schedule your appointment should you arrive 10 or more minutes late.  We strive to give you quality time with our providers, and arriving late affects you and other patients whose appointments are after yours.  Also, if you no show three or more times for appointments you may be dismissed from the clinic at the providers discretion.     Again, thank you for choosing [facility].  Our hope is that these requests will decrease the amount of time that you wait before being seen by our physicians.       _____________________________________________________________  Should you have questions after your visit to [facility], please contact our office at [phone] between the hours of 8:30 a.m. and 4:30 p.m.  Voicemails left after 4:30 p.m. will not be returned until the following business day.  For prescription refill requests, have your pharmacy contact our office.         Resources For Cancer Patients and their Caregivers ? American Cancer Society: Can assist with transportation, wigs, general needs, runs Look Good Feel Better.         815-100-6659 ? Cancer Care: Provides financial assistance, online support groups, medication/co-pay assistance.  1-800-813-HOPE 402-217-0557) ? [place] Assists [place] cancer patients and their families through emotional , educational and financial support.  9080379305 ? [place] DSS Where to apply for food stamps, Medicaid and utility assistance. (216)399-0871 ? RCATS: Transportation to medical appointments. (913) 245-8155 ? Social Security Administration: May apply for disability if have a Stage IV cancer. 815 104 5791 364-630-6642 ? [employer], Disability and Transit Services: Assists with nutrition, care and transit needs. [place] Support Programs: @10RELATIVEDAYS @ > Cancer Support Group  2nd Tuesday of the month 1pm-2pm, Journey Room  > Creative Journey  3rd Tuesday of the month 1130am-1pm, Journey Room  > Look Good Feel Better  1st Wednesday of the month 10am-12 noon, Journey Room (Call [place] to register [phone])

## 2016-02-04 NOTE — Telephone Encounter (Signed)
Left message for patient to call back  

## 2016-02-05 LAB — CHROMOGRANIN A: CHROMOGRANIN A: 5 nmol/L (ref 0–5)

## 2016-02-05 NOTE — Telephone Encounter (Signed)
I contacted the patient.  He states he does not have transportation to come to [place] and declines to schedule an appt at this time.

## 2016-02-06 ENCOUNTER — Encounter: Payer: Self-pay | Admitting: Gastroenterology

## 2016-02-06 LAB — SEROTONIN SERUM: Serotonin, Serum: 131 ng/mL (ref 21–321)

## 2016-02-10 ENCOUNTER — Other Ambulatory Visit: Payer: Self-pay | Admitting: *Deleted

## 2016-02-10 DIAGNOSIS — G8929 Other chronic pain: Secondary | ICD-10-CM

## 2016-02-10 MED ORDER — HYDROCODONE-ACETAMINOPHEN 5-325 MG PO TABS: 1.0000 | ORAL_TABLET | ORAL | Status: DC | PRN

## 2016-02-10 NOTE — Telephone Encounter (Signed)
Noted Will send back to Oncology so that they know

## 2016-03-16 ENCOUNTER — Other Ambulatory Visit: Payer: Self-pay | Admitting: *Deleted

## 2016-03-16 ENCOUNTER — Telehealth: Payer: Self-pay | Admitting: Gastroenterology

## 2016-03-16 ENCOUNTER — Other Ambulatory Visit: Payer: Self-pay | Admitting: Orthopedic Surgery

## 2016-03-16 ENCOUNTER — Other Ambulatory Visit: Payer: Self-pay

## 2016-03-16 ENCOUNTER — Telehealth: Payer: Self-pay | Admitting: Orthopedic Surgery

## 2016-03-16 DIAGNOSIS — G8929 Other chronic pain: Secondary | ICD-10-CM

## 2016-03-16 DIAGNOSIS — D3A00 Benign carcinoid tumor of unspecified site: Secondary | ICD-10-CM

## 2016-03-16 MED ORDER — HYDROCODONE-ACETAMINOPHEN 5-325 MG PO TABS: 1.0000 | ORAL_TABLET | Freq: Four times a day (QID) | ORAL | Status: DC | PRN

## 2016-03-16 MED ORDER — HYDROCODONE-ACETAMINOPHEN 5-325 MG PO TABS: 1.0000 | ORAL_TABLET | ORAL | Status: DC | PRN

## 2016-03-16 NOTE — Telephone Encounter (Signed)
Patient called for refill of: HYDROcodone-acetaminophen (NORCO) 5-325 MG tablet [id number] - quantity 180

## 2016-03-16 NOTE — Telephone Encounter (Signed)
Orders are in and instructions are in the mail

## 2016-03-16 NOTE — Telephone Encounter (Signed)
ONCOLOGY REQUESTING ANATOMIC BIOPSIES. PT NEEDS REPEAT EGD, Dx: GASTRIC CARCINOID. EGD NEXT WEEK(JUL 26 OR 27) AND SEND BIOPSIES FROM CARDIA, FUNDUS, BODY, AND ANTRUM. NO POLYPECTOMY DUE TO RISK OF BLEEDING.

## 2016-03-17 NOTE — Telephone Encounter (Signed)
Prescription available, patient aware  

## 2016-03-18 ENCOUNTER — Encounter (HOSPITAL_COMMUNITY): Payer: Medicare Other | Attending: Oncology

## 2016-03-18 DIAGNOSIS — D509 Iron deficiency anemia, unspecified: Secondary | ICD-10-CM | POA: Diagnosis not present

## 2016-03-18 DIAGNOSIS — M25569 Pain in unspecified knee: Secondary | ICD-10-CM | POA: Insufficient documentation

## 2016-03-18 DIAGNOSIS — D3A8 Other benign neuroendocrine tumors: Secondary | ICD-10-CM

## 2016-03-18 DIAGNOSIS — G8929 Other chronic pain: Secondary | ICD-10-CM | POA: Insufficient documentation

## 2016-03-18 DIAGNOSIS — Z9889 Other specified postprocedural states: Secondary | ICD-10-CM | POA: Diagnosis not present

## 2016-03-18 DIAGNOSIS — Z8711 Personal history of peptic ulcer disease: Secondary | ICD-10-CM | POA: Diagnosis not present

## 2016-03-18 DIAGNOSIS — I10 Essential (primary) hypertension: Secondary | ICD-10-CM | POA: Insufficient documentation

## 2016-03-18 DIAGNOSIS — E538 Deficiency of other specified B group vitamins: Secondary | ICD-10-CM | POA: Diagnosis not present

## 2016-03-18 DIAGNOSIS — E039 Hypothyroidism, unspecified: Secondary | ICD-10-CM | POA: Insufficient documentation

## 2016-03-18 DIAGNOSIS — M109 Gout, unspecified: Secondary | ICD-10-CM | POA: Insufficient documentation

## 2016-03-18 DIAGNOSIS — D490 Neoplasm of unspecified behavior of digestive system: Secondary | ICD-10-CM | POA: Insufficient documentation

## 2016-03-18 LAB — CBC WITH DIFFERENTIAL/PLATELET
BASOS PCT: 1 %
Basophils Absolute: 0 10*3/uL (ref 0.0–0.1)
EOS ABS: 0.3 10*3/uL (ref 0.0–0.7)
EOS PCT: 5 %
HCT: 43.7 % (ref 39.0–52.0)
Hemoglobin: 14.3 g/dL (ref 13.0–17.0)
LYMPHS ABS: 2.3 10*3/uL (ref 0.7–4.0)
Lymphocytes Relative: 40 %
MCH: 29.6 pg (ref 26.0–34.0)
MCHC: 32.7 g/dL (ref 30.0–36.0)
MCV: 90.5 fL (ref 78.0–100.0)
MONOS PCT: 10 %
Monocytes Absolute: 0.6 10*3/uL (ref 0.1–1.0)
Neutro Abs: 2.6 10*3/uL (ref 1.7–7.7)
Neutrophils Relative %: 44 %
PLATELETS: 140 10*3/uL — AB (ref 150–400)
RBC: 4.83 MIL/uL (ref 4.22–5.81)
RDW: 13.8 % (ref 11.5–15.5)
WBC: 5.8 10*3/uL (ref 4.0–10.5)

## 2016-03-18 LAB — COMPREHENSIVE METABOLIC PANEL
ALT: 24 U/L (ref 17–63)
AST: 36 U/L (ref 15–41)
Albumin: 3.8 g/dL (ref 3.5–5.0)
Alkaline Phosphatase: 66 U/L (ref 38–126)
Anion gap: 7 (ref 5–15)
BUN: 15 mg/dL (ref 6–20)
CALCIUM: 8.7 mg/dL — AB (ref 8.9–10.3)
CO2: 26 mmol/L (ref 22–32)
CREATININE: 1.23 mg/dL (ref 0.61–1.24)
Chloride: 103 mmol/L (ref 101–111)
GFR calc Af Amer: >60 mL/min (ref >60)
GFR calc non Af Amer: 59 mL/min — ABNORMAL LOW (ref >60)
GLUCOSE: 109 mg/dL — AB (ref 65–99)
POTASSIUM: 3.8 mmol/L (ref 3.5–5.1)
SODIUM: 136 mmol/L (ref 135–145)
TOTAL PROTEIN: 7.9 g/dL (ref 6.5–8.1)
Total Bilirubin: 0.7 mg/dL (ref 0.3–1.2)

## 2016-03-18 LAB — IRON AND TIBC
Iron: 86 ug/dL (ref 45–182)
SATURATION RATIOS: 22 % (ref 17.9–39.5)
TIBC: 398 ug/dL (ref 250–450)
UIBC: 312 ug/dL

## 2016-03-18 LAB — FERRITIN: Ferritin: 21 ng/mL — ABNORMAL LOW (ref 24–336)

## 2016-03-18 LAB — VITAMIN B12: VITAMIN B 12: 1127 pg/mL — AB (ref 180–914)

## 2016-03-19 ENCOUNTER — Ambulatory Visit: Payer: Medicare Other | Admitting: Gastroenterology

## 2016-03-19 LAB — CHROMOGRANIN A: CHROMOGRANIN A: 4 nmol/L (ref 0–5)

## 2016-03-20 ENCOUNTER — Encounter (HOSPITAL_COMMUNITY): Payer: Self-pay | Admitting: Oncology

## 2016-03-20 ENCOUNTER — Encounter (HOSPITAL_BASED_OUTPATIENT_CLINIC_OR_DEPARTMENT_OTHER): Payer: Medicare Other | Admitting: Oncology

## 2016-03-20 VITALS — BP 124/64 | HR 75 | Temp 98.9°F | Resp 16 | Wt 257.0 lb

## 2016-03-20 DIAGNOSIS — D509 Iron deficiency anemia, unspecified: Secondary | ICD-10-CM | POA: Diagnosis not present

## 2016-03-20 DIAGNOSIS — D3A8 Other benign neuroendocrine tumors: Secondary | ICD-10-CM

## 2016-03-20 DIAGNOSIS — D489 Neoplasm of uncertain behavior, unspecified: Secondary | ICD-10-CM

## 2016-03-20 LAB — SEROTONIN SERUM: SEROTONIN, SERUM: 24 ng/mL (ref 21–321)

## 2016-03-20 NOTE — Patient Instructions (Signed)
[employer] at [facility] Discharge Instructions  RECOMMENDATIONS MADE BY THE CONSULTANT AND ANY TEST RESULTS WILL BE SENT TO YOUR REFERRING PHYSICIAN.  Exam done and seen today by [facility] in 8 weeks Continue PO iron Return to see the Doctor in 8 weeks Call the clinic for any concerns or questions.   Thank you for choosing [place] at [facility] to provide your oncology and hematology care.  To afford each patient quality time with our provider, please arrive at least 15 minutes before your scheduled appointment time.   Beginning January 23rd 2017 lab work for the [facility] will be done in the  Main lab at [facility] on 1st floor. If you have a lab appointment with the [facility] please come in thru the  Main Entrance and check in at the main information desk  You need to re-schedule your appointment should you arrive 10 or more minutes late.  We strive to give you quality time with our providers, and arriving late affects you and other patients whose appointments are after yours.  Also, if you no show three or more times for appointments you may be dismissed from the clinic at the providers discretion.     Again, thank you for choosing [facility].  Our hope is that these requests will decrease the amount of time that you wait before being seen by our physicians.       _____________________________________________________________  Should you have questions after your visit to [facility], please contact our office at [phone] between the hours of 8:30 a.m. and 4:30 p.m.  Voicemails left after 4:30 p.m. will not be returned until the following business day.  For prescription refill requests, have your pharmacy contact our office.         Resources For Cancer Patients and their Caregivers ? American Cancer Society: Can assist with transportation, wigs, general needs, runs Look Good Feel  Better.        [phone] ? Cancer Care: Provides financial assistance, online support groups, medication/co-pay assistance.  1-800-813-HOPE (252)513-9942) ? [place] Assists [place] cancer patients and their families through emotional , educational and financial support.  9476118572 ? [place] DSS Where to apply for food stamps, Medicaid and utility assistance. (901)356-4395 ? RCATS: Transportation to medical appointments. (978) 038-3095 ? Social Security Administration: May apply for disability if have a Stage IV cancer. [phone] ? [employer], Disability and Transit Services: Assists with nutrition, care and transit needs. [place] Support Programs: @10RELATIVEDAYS @ > Cancer Support Group  2nd Tuesday of the month 1pm-2pm, Journey Room  > Creative Journey  3rd Tuesday of the month 1130am-1pm, Journey Room  > Look Good Feel Better  1st Wednesday of the month 10am-12 noon, Journey Room (Call [place] to register [phone])

## 2016-03-22 NOTE — Progress Notes (Signed)
Philip Gravel, MD [address] [place] [address]  Neuroendocrine tumor - Plan: CBC with Differential, Basic metabolic panel, Chromogranin A, Serotonin serum  ANEMIA, IRON DEFICIENCY - Plan: CBC with Differential, Iron and TIBC, Ferritin, Basic metabolic panel  CURRENT THERAPY: Surveillance and continued PO iron replacement with ferrex forte beginning on 02/04/2016  INTERVAL HISTORY: [name] 65 y.o. male returns for followup of well-differentiated neuroendocrine carcinoid tumor on polypectomy by Dr. Oneida Alar on 08/14/2014 and 10/08/2015 and negative octreotide scan on 09/19/2014.  His case was presented at GI tumor board in Feb/March 2016 with the recommendation of anatomic biopsies to guide role of surgical resection.   AND Iron deficiency anemia, on ferrex forte beginning on 02/04/2016.  He denies any complaints today. He denies any blood in his stool, black tarry stool, or other signs/symptoms of blood loss.  He is scheduled for repeat EGD by Dr. Oneida Alar and he notes that he is unsure whether he wants this to be done or not. He is provided education regarding the role of upcoming EGD and he strongly encouraged to reconsider.   Review of Systems  Constitutional: Negative.   HENT: Negative.   Eyes: Negative.   Respiratory: Negative.   Cardiovascular: Negative.   Gastrointestinal: Negative.   Genitourinary: Negative.   Musculoskeletal: Negative.   Skin: Negative.   Neurological: Negative.   Endo/Heme/Allergies: Negative.   Psychiatric/Behavioral: Negative.     Past Medical History:  Diagnosis Date  . Anemia FeDA: GASTRIC POLYPS, B12   TCS 2008 EGD 2009, 2008-HB 11.1 MCV 83.6 CR 1.22, 2009 FERRITIN 102-22  . B12 deficiency   . Carcinoid tumor determined by biopsy of stomach   . Chronic knee pain   . Diverticulosis 2008 LGIB  . DIVERTICULOSIS, COLON 08/15/2006   Qualifier: Diagnosis of  By: Jonna Munro MD, Roderic Scarce    . Gout   . Hepatomegaly 2o to FATTY  LIVER DZ  . History of alcohol abuse   . History of septic arthritis   . HTN (hypertension)   . Hypothyroidism   . Neuroendocrine tumor 09/25/2014  . PUD 11/30/2008   Qualifier: History of  By: Jonna Munro MD, Roderic Scarce      Past Surgical History:  Procedure Laterality Date  . BIOPSY  08/14/2014   Procedure: BIOPSY;  Surgeon: Danie Binder, MD;  Location: [facility];  Service: Endoscopy;;  . BIOPSY  03/12/2015   Procedure: BIOPSY;  Surgeon: Danie Binder, MD;  Location: [facility];  Service: Endoscopy;;  . CHOLECYSTECTOMY    . COLONOSCOPY  2008 [facility] DJ   LGIB 2o to TICS, prep good  . COLONOSCOPY WITH PROPOFOL N/A 08/14/2014   SLF: 1. Four large colon polyps removed. 2. The left colon is redundant 3. Moderate diverticulosis throughout teh entire examined colon  . COLONOSCOPY WITH PROPOFOL N/A 11/06/2014   SLF: 8 small polyps removed. One large pedunculated polyp removed from the ascending colon, tubulovillous and tubular adenomas. Next colonoscopy March 2019  . ESOPHAGOGASTRODUODENOSCOPY  12/08/2007   [id number] gastric polyps seen in the cardia and body of the stomach/Normal esophagus without evidence of Barrett's, mass, erosion/Normal duodenal bulb and second portion of the duodenum. Benign bx.  . ESOPHAGOGASTRODUODENOSCOPY (EGD) WITH PROPOFOL N/A 08/14/2014   SLF: 1. Heme postive stools due to gastric and colon polyps 2. Multiple gastric   . ESOPHAGOGASTRODUODENOSCOPY (EGD) WITH PROPOFOL N/A 03/12/2015   SLF: Multiple gastric nodules seen in gasric body/antrum. 2. Non-erosive gastritis ( inflammation) was found in  the gastric antrum.   . ESOPHAGOGASTRODUODENOSCOPY (EGD) WITH PROPOFOL N/A 10/08/2015   Procedure: ESOPHAGOGASTRODUODENOSCOPY (EGD) WITH PROPOFOL;  Surgeon: Danie Binder, MD;  Location: [facility] END[facility] SU[facility];  Service: Endoscopy;  Laterality: N/A;  0945  . HAND SURGERY    . POLYPECTOMY  08/14/2014   Procedure: POLYPECTOMY;  Surgeon: Danie Binder, MD;  Location: [facility] ORS;  Service: Endoscopy;;  .  POLYPECTOMY N/A 11/06/2014   Procedure: POLYPECTOMY;  Surgeon: Danie Binder, MD;  Location: [facility] OR[facility];  Service: Endoscopy;  Laterality: N/A;  Transverse Colon x3, Ascending Colon x2, Descending Colon x3  . REPLACEMENT TOTAL KNEE BILATERAL    . UPPER GASTROINTESTINAL ENDOSCOPY  APR 2009   INFLAMED HYPERPLASTIC POLYPS, CHRONIC GASTRITIS    Family History  Problem Relation Age of Onset  . Colon polyps Neg Hx   . Colon cancer Neg Hx   . Pancreatic disease Neg Hx   . Hypertension Sister   . Hypertension Brother   . Diabetes Brother     Social History   Social History  . Marital status: Single    Spouse name: N/A  . Number of children: 0  . Years of education: N/A   Occupational History  . retired    Social History Main Topics  . Smoking status: Never Smoker  . Smokeless tobacco: Never Used     Comment: Quit x 10 years/ never smoked on regular basis  . Alcohol use 1.2 oz/week    2 Shots of liquor per week     Comment: drinks on weekends, gin/vodka 1/5th.  . Drug use: No  . Sexual activity: No   Other Topics Concern  . None   Social History Narrative   HE DOES NOT HAVE ANY CHILDREN.     PHYSICAL EXAMINATION  ECOG PERFORMANCE STATUS: 1 - Symptomatic but completely ambulatory  Vitals:   03/20/16 1000  BP: 124/64  Pulse: 75  Resp: 16  Temp: 98.9 F (37.2 C)    GENERAL:alert, well nourished, well developed, comfortable, cooperative, smiling and unaccompanied SKIN: skin color, texture, turgor are normal, no rashes or significant lesions HEAD: Normocephalic, No masses, lesions, tenderness or abnormalities EYES: normal, EOMI, Conjunctiva are pink and non-injected EARS: External ears normal OROPHARYNX:lips, buccal mucosa, and tongue normal and mucous membranes are moist  NECK: supple, trachea midline LYMPH:  no palpable lymphadenopathy BREAST:not examined LUNGS: clear to auscultation  HEART: regular rate & rhythm ABDOMEN:abdomen soft, non-tender, obese and normal  bowel sounds BACK: Back symmetric, no curvature. EXTREMITIES:less then 2 second capillary refill, no joint deformities, effusion, or inflammation, no skin discoloration, no cyanosis  NEURO: alert & oriented x 3 with fluent speech, no focal motor/sensory deficits, gait normal   LABORATORY DATA: CBC    Component Value Date/Time   WBC 5.8 03/18/2016 0942   RBC 4.83 03/18/2016 0942   HGB 14.3 03/18/2016 0942   HCT 43.7 03/18/2016 0942   HCT 44 01/05/2013   PLT 140 (L) 03/18/2016 0942   PLT 148 01/05/2013   MCV 90.5 03/18/2016 0942   MCV 90.4 01/05/2013   MCH 29.6 03/18/2016 0942   MCHC 32.7 03/18/2016 0942   RDW 13.8 03/18/2016 0942   LYMPHSABS 2.3 03/18/2016 0942   MONOABS 0.6 03/18/2016 0942   EOSABS 0.3 03/18/2016 0942   BASOSABS 0.0 03/18/2016 0942      Chemistry      Component Value Date/Time   NA 136 03/18/2016 0942   K 3.8 03/18/2016 0942   CL 103 03/18/2016 0942  CO2 26 03/18/2016 0942   BUN 15 03/18/2016 0942   CREATININE 1.23 03/18/2016 0942   CREATININE 1.30 02/24/2012 1023      Component Value Date/Time   CALCIUM 8.7 (L) 03/18/2016 0942   ALKPHOS 66 03/18/2016 0942   AST 36 03/18/2016 0942   ALT 24 03/18/2016 0942   BILITOT 0.7 03/18/2016 0942     Lab Results  Component Value Date   IRON 86 03/18/2016   TIBC 398 03/18/2016   FERRITIN 21 (L) 03/18/2016     PENDING LABS:   RADIOGRAPHIC STUDIES:  No results found.   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  Octreotide scan on 09/19/2014 was negative for any avid metastasis.  His case was discussed at GI tumor board in Feb/March 2016 and recommendation was for anatomical biopsies.  Dr. Oneida Alar is planning on performing this in the near future.  Labs in 8 weeks: CBC diff, CMET, serum serotonin, chromogranin A.  Return in 8 weeks for  follow-up.  ANEMIA, IRON DEFICIENCY Iron deficiency without anemia, on PO iron replacement (ferrex forte)  beginning on 02/04/2016.  Labs in 8 weeks: CBC diff, iron/TIBC, ferritin.   ORDERS PLACED FOR THIS ENCOUNTER: Orders Placed This Encounter  Procedures  . CBC with Differential  . Iron and TIBC  . Ferritin  . Basic metabolic panel  . Chromogranin A  . Serotonin serum    MEDICATIONS PRESCRIBED THIS ENCOUNTER: No orders of the defined types were placed in this encounter.   THERAPY PLAN:  Ferrex forte escribed for iron deficiency WITHOUT anemia.  Further GI work-up is necessary.    All questions were answered. The patient knows to call the clinic with any problems, questions or concerns. We can certainly see the patient much sooner if necessary.  Patient and plan discussed with Dr. Ancil Linsey and she is in agreement with the aforementioned.   This note is electronically signed by: Doy Mince 03/22/2016 8:26 PM

## 2016-03-22 NOTE — Assessment & Plan Note (Signed)
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  Octreotide scan on 09/19/2014 was negative for any avid metastasis.  His case was discussed at GI tumor board in Feb/March 2016 and recommendation was for anatomical biopsies.  Dr. Oneida Alar is planning on performing this in the near future.  Labs in 8 weeks: CBC diff, CMET, serum serotonin, chromogranin A.  Return in 8 weeks for follow-up.

## 2016-03-22 NOTE — Assessment & Plan Note (Addendum)
Iron deficiency without anemia, on PO iron replacement (ferrex forte)  beginning on 02/04/2016.  Labs in 8 weeks: CBC diff, iron/TIBC, ferritin.

## 2016-03-26 ENCOUNTER — Telehealth: Payer: Self-pay | Admitting: General Practice

## 2016-03-26 ENCOUNTER — Encounter (HOSPITAL_COMMUNITY): Admission: RE | Payer: Self-pay | Source: Ambulatory Visit

## 2016-03-26 ENCOUNTER — Ambulatory Visit (HOSPITAL_COMMUNITY): Admission: RE | Admit: 2016-03-26 | Payer: Medicare Other | Source: Ambulatory Visit | Admitting: Gastroenterology

## 2016-03-26 ENCOUNTER — Encounter: Payer: Self-pay | Admitting: General Practice

## 2016-03-26 SURGERY — EGD (ESOPHAGOGASTRODUODENOSCOPY)
Anesthesia: Moderate Sedation

## 2016-03-26 NOTE — Telephone Encounter (Signed)
I received a call from [facility] with Short Stay stating the patient said he did not "feel like coming in for his procedure today"

## 2016-04-10 ENCOUNTER — Encounter: Payer: Self-pay | Admitting: Gastroenterology

## 2016-04-10 ENCOUNTER — Other Ambulatory Visit: Payer: Self-pay

## 2016-04-10 ENCOUNTER — Ambulatory Visit (INDEPENDENT_AMBULATORY_CARE_PROVIDER_SITE_OTHER): Payer: Medicare Other | Admitting: Gastroenterology

## 2016-04-10 DIAGNOSIS — D3A092 Benign carcinoid tumor of the stomach: Secondary | ICD-10-CM

## 2016-04-10 NOTE — Patient Instructions (Signed)
Upper endoscopy as scheduled. See separate instructions.

## 2016-04-10 NOTE — Progress Notes (Signed)
Primary Care Physician:  [name], MD  Primary Gastroenterologist:  Barney Drain, MD   Chief Complaint  Patient presents with  . Follow-up    doing ok    HPI:  [name] is a 65 y.o. male with history of gastric carcinoid tumor diagnosed originally 07/2014 here for follow up and to consider repeat EGD for anatomic biopsies at the request of oncology. His last EGD was in 10/2015 and he had 5 gastric polyps removed, path came back with carcinoid tumor in one of the specimens. Negative octreotide scan in 08/2014. Patient has been reluctant to having another EGD but has agreed today.   Patient also has h/o IDA followed by hematology. H/o tubulovillous colonic adenomas, next TCS due 10/2017. H/O fatty liver, alcohol abuse.   Patient without complaints. Denies abdominal pain, n/v, constipation, diarrhea, melena, brbpr, heartburn, dysphagia.   Current Outpatient Prescriptions  Medication Sig Dispense Refill  . acetaminophen (TYLENOL) 500 MG tablet Take 500 mg by mouth every 6 (six) hours as needed for moderate pain.    Marland Kitchen amLODipine (NORVASC) 10 MG tablet Take 10 mg by mouth daily.    . cyanocobalamin (,VITAMIN B-12,) 1000 MCG/ML injection Inject 1,000 mcg into the muscle every 30 (thirty) days.      . febuxostat (ULORIC) 40 MG tablet Take 40 mg by mouth daily.     Marland Kitchen HYDROcodone-acetaminophen (NORCO/VICODIN) 5-325 MG tablet Take 1 tablet by mouth every 6 (six) hours as needed for moderate pain. 56 tablet 0  . levothyroxine (SYNTHROID, LEVOTHROID) 175 MCG tablet Take 175 mcg by mouth daily before breakfast.     . metoprolol succinate (TOPROL-XL) 25 MG 24 hr tablet Take 25 mg by mouth daily.     Marland Kitchen omeprazole (PRILOSEC) 20 MG capsule Take 20 mg by mouth daily.     . Polysacchar Iron-FA-B12 (FERREX 150 FORTE) 150-1-25 MG-MG-MCG CAPS Take 1 capsule by mouth daily. 30 capsule 3  . tamsulosin (FLOMAX) 0.4 MG CAPS capsule Take 0.4 mg by mouth daily.     No current facility-administered medications for  this visit.     Allergies as of 04/10/2016 - Review Complete 04/10/2016  Allergen Reaction Noted  . Aspirin  06/01/2007    Past Medical History:  Diagnosis Date  . Anemia FeDA: GASTRIC POLYPS, B12   TCS 2008 EGD 2009, 2008-HB 11.1 MCV 83.6 CR 1.22, 2009 FERRITIN 102-22  . B12 deficiency   . Carcinoid tumor determined by biopsy of stomach   . Chronic knee pain   . Diverticulosis 2008 LGIB  . DIVERTICULOSIS, COLON 08/15/2006   Qualifier: Diagnosis of  By: Jonna Munro MD, Roderic Scarce    . Gout   . Hepatomegaly 2o to FATTY LIVER DZ  . History of alcohol abuse   . History of septic arthritis   . HTN (hypertension)   . Hypothyroidism   . Neuroendocrine tumor 09/25/2014  . PUD 11/30/2008   Qualifier: History of  By: Jonna Munro MD, Roderic Scarce      Past Surgical History:  Procedure Laterality Date  . BIOPSY  08/14/2014   Procedure: BIOPSY;  Surgeon: Danie Binder, MD;  Location: [facility];  Service: Endoscopy;;  . BIOPSY  03/12/2015   Procedure: BIOPSY;  Surgeon: Danie Binder, MD;  Location: [facility];  Service: Endoscopy;;  . CHOLECYSTECTOMY    . COLONOSCOPY  2008 [facility] DJ   LGIB 2o to TICS, prep good  . COLONOSCOPY WITH PROPOFOL N/A 08/14/2014   SLF: 1. Four large colon polyps removed. 2. The left  colon is redundant 3. Moderate diverticulosis throughout teh entire examined colon  . COLONOSCOPY WITH PROPOFOL N/A 11/06/2014   SLF: 8 small polyps removed. One large pedunculated polyp removed from the ascending colon, tubulovillous and tubular adenomas. Next colonoscopy March 2019  . ESOPHAGOGASTRODUODENOSCOPY  12/08/2007   [id number] gastric polyps seen in the cardia and body of the stomach/Normal esophagus without evidence of Barrett's, mass, erosion/Normal duodenal bulb and second portion of the duodenum. Benign bx.  . ESOPHAGOGASTRODUODENOSCOPY (EGD) WITH PROPOFOL N/A 08/14/2014   SLF: 1. Heme postive stools due to gastric and colon polyps 2. Multiple gastric   . ESOPHAGOGASTRODUODENOSCOPY (EGD)  WITH PROPOFOL N/A 03/12/2015   SLF: Multiple gastric nodules seen in gasric body/antrum. 2. Non-erosive gastritis ( inflammation) was found in the gastric antrum.   . ESOPHAGOGASTRODUODENOSCOPY (EGD) WITH PROPOFOL N/A 10/08/2015   Procedure: ESOPHAGOGASTRODUODENOSCOPY (EGD) WITH PROPOFOL;  Surgeon: Danie Binder, MD;  Location: [facility];  Service: Endoscopy;  Laterality: N/A;  0945  . HAND SURGERY    . POLYPECTOMY  08/14/2014   Procedure: POLYPECTOMY;  Surgeon: Danie Binder, MD;  Location: [facility];  Service: Endoscopy;;  . POLYPECTOMY N/A 11/06/2014   Procedure: POLYPECTOMY;  Surgeon: Danie Binder, MD;  Location: [facility];  Service: Endoscopy;  Laterality: N/A;  Transverse Colon x3, Ascending Colon x2, Descending Colon x3  . REPLACEMENT TOTAL KNEE BILATERAL    . UPPER GASTROINTESTINAL ENDOSCOPY  APR 2009   INFLAMED HYPERPLASTIC POLYPS, CHRONIC GASTRITIS    Family History  Problem Relation Age of Onset  . Hypertension Sister   . Hypertension Brother   . Diabetes Brother   . Colon polyps Neg Hx   . Colon cancer Neg Hx   . Pancreatic disease Neg Hx     Social History   Social History  . Marital status: Single    Spouse name: N/A  . Number of children: 0  . Years of education: N/A   Occupational History  . retired    Social History Main Topics  . Smoking status: Never Smoker  . Smokeless tobacco: Never Used     Comment: Quit x 10 years/ never smoked on regular basis  . Alcohol use 1.2 oz/week    2 Shots of liquor per week     Comment: drinks on weekends, gin/vodka 1/5th.  . Drug use: No  . Sexual activity: No   Other Topics Concern  . Not on file   Social History Narrative   HE DOES NOT HAVE ANY CHILDREN.      ROS:  General: Negative for anorexia, weight loss, fever, chills, fatigue, weakness. Eyes: Negative for vision changes.  ENT: Negative for hoarseness, difficulty swallowing , nasal congestion. CV: Negative for chest pain, angina, palpitations, dyspnea on  exertion, peripheral edema.  Respiratory: Negative for dyspnea at rest, dyspnea on exertion, cough, sputum, wheezing.  GI: See history of present illness. GU:  Negative for dysuria, hematuria, urinary incontinence, urinary frequency, nocturnal urination.  MS: Negative for joint pain, low back pain.  Derm: Negative for rash or itching.  Neuro: Negative for weakness, abnormal sensation, seizure, frequent headaches, memory loss, confusion.  Psych: Negative for anxiety, depression, suicidal ideation, hallucinations.  Endo: Negative for unusual weight change.  Heme: Negative for bruising or bleeding. Allergy: Negative for rash or hives.    Physical Examination:  BP (!) 143/85   Pulse 85   Temp 98.4 F (36.9 C) (Oral)   Ht 6\' 6"  (1.981 m)   Wt 255 lb (115.7  kg)   BMI 29.47 kg/m    General: Well-nourished, well-developed in no acute distress.  Head: Normocephalic, atraumatic.   Eyes: Conjunctiva pink, no icterus. Mouth: Oropharyngeal mucosa moist and pink , no lesions erythema or exudate. Neck: Supple without thyromegaly, masses, or lymphadenopathy.  Lungs: Clear to auscultation bilaterally.  Heart: Regular rate and rhythm, no murmurs rubs or gallops.  Abdomen: Bowel sounds are normal, nontender, nondistended, no hepatosplenomegaly or masses, no abdominal bruits or    hernia , no rebound or guarding.   Rectal: not performed Extremities: No lower extremity edema. No clubbing or deformities.  Neuro: Alert and oriented x 4 , grossly normal neurologically.  Skin: Warm and dry, no rash or jaundice.   Psych: Alert and cooperative, normal mood and affect.  Labs: Lab Results  Component Value Date   VITAMINB12 1,127 (H) 03/18/2016   Lab Results  Component Value Date   IRON 86 03/18/2016   TIBC 398 03/18/2016   FERRITIN 21 (L) 03/18/2016   Lab Results  Component Value Date   WBC 5.8 03/18/2016   HGB 14.3 03/18/2016   HCT 43.7 03/18/2016   MCV 90.5 03/18/2016   PLT 140 (L)  03/18/2016   Lab Results  Component Value Date   CREATININE 1.23 03/18/2016   BUN 15 03/18/2016   NA 136 03/18/2016   K 3.8 03/18/2016   CL 103 03/18/2016   CO2 26 03/18/2016   Lab Results  Component Value Date   ALT 24 03/18/2016   AST 36 03/18/2016   ALKPHOS 66 03/18/2016   BILITOT 0.7 03/18/2016     Imaging Studies: No results found.

## 2016-04-13 NOTE — Assessment & Plan Note (Addendum)
65 y/o male with history of gastric carcinoid tumor diagnosed in 2015 who presents back for consideration for EGD to obtain anatomical biopsies from the cardia, fundus, body, and antrum at request of oncology.  Patient is agreeable at this time.  I have discussed the risks, alternatives, benefits with regards to but not limited to the risk of reaction to medication, bleeding, infection, perforation and the patient is agreeable to proceed. Written consent to be obtained.  He will continue to follow with hematology for IDA. H/O fatty liver, normal LFTs, no overt findings of cirrhosis at this time.

## 2016-04-13 NOTE — Progress Notes (Signed)
cc'ed to pcp °

## 2016-04-15 ENCOUNTER — Telehealth: Payer: Self-pay | Admitting: Orthopedic Surgery

## 2016-04-15 NOTE — Telephone Encounter (Signed)
Hydrocodone-Acetaminophen  5/325mg  Qty 56 Tablets ° °Take 1 tablet by mouth every 6 (six) hours as needed for moderate pain. °

## 2016-04-15 NOTE — Patient Instructions (Signed)
[name]  04/15/2016     @PREFPERIOPPHARMACY @   Your procedure is scheduled on  04/21/2016   Report to [facility] at  615  A.M.  Call this number if you have problems the morning of surgery:  [phone]   Remember:  Do not eat food or drink liquids after midnight.  Take these medicines the morning of surgery with A SIP OF WATER  Norvasc, uloric, hydrocodone,levothyroxine, metoprolol, prilosec, flomax.   Do not wear jewelry, make-up or nail polish.  Do not wear lotions, powders, or perfumes.  You may wear deoderant.  Do not shave 48 hours prior to surgery.  Men may shave face and neck.  Do not bring valuables to the hospital.  [facility] is not responsible for any belongings or valuables.  Contacts, dentures or bridgework may not be worn into surgery.  Leave your suitcase in the car.  After surgery it may be brought to your room.  For patients admitted to the hospital, discharge time will be determined by your treatment team.  Patients discharged the day of surgery will not be allowed to drive home.   Name and phone number of your driver:   family Special instructions:  Follow the diet instructions given to you by Dr Oneida Alar office  Please read over the following fact sheets that you were given. Anesthesia Post-op Instructions and Care and Recovery After Surgery      Esophagogastroduodenoscopy Esophagogastroduodenoscopy (EGD) is a procedure that is used to examine the lining of the esophagus, stomach, and first part of the small intestine (duodenum). A long, flexible, lighted tube with a camera attached (endoscope) is inserted down the throat to view these organs. This procedure is done to detect problems or abnormalities, such as inflammation, bleeding, ulcers, or growths, in order to treat them. The procedure lasts 5-20 minutes. It is usually an outpatient procedure, but it may need to be performed in a hospital in emergency cases. LET [facility] CARE  PROVIDER KNOW ABOUT:  Any allergies you have.  All medicines you are taking, including vitamins, herbs, eye drops, creams, and over-the-counter medicines.  Previous problems you or members of your family have had with the use of anesthetics.  Any blood disorders you have.  Previous surgeries you have had.  Medical conditions you have. RISKS AND COMPLICATIONS Generally, this is a safe procedure. However, problems can occur and include:  Infection.  Bleeding.  Tearing (perforation) of the esophagus, stomach, or duodenum.  Difficulty breathing or not being able to breathe.  Excessive sweating.  Spasms of the larynx.  Slowed heartbeat.  Low blood pressure. BEFORE THE PROCEDURE  Do not eat or drink anything after midnight on the night before the procedure or as directed by your health care provider.  Do not take your regular medicines before the procedure if your health care provider asks you not to. Ask your health care provider about changing or stopping those medicines.  If you wear dentures, be prepared to remove them before the procedure.  Arrange for someone to drive you home after the procedure. PROCEDURE  A numbing medicine (local anesthetic) may be sprayed in your throat for comfort and to stop you from gagging or coughing.  You will have an IV tube inserted in a vein in your hand or arm. You will receive medicines and fluids through this tube.  You will be given a medicine to relax you (sedative).  A pain reliever will  be given through the IV tube.  A mouth guard may be placed in your mouth to protect your teeth and to keep you from biting on the endoscope.  You will be asked to lie on your left side.  The endoscope will be inserted down your throat and into your esophagus, stomach, and duodenum.  Air will be put through the endoscope to allow your health care provider to clearly view the lining of your esophagus.  The lining of your esophagus, stomach,  and duodenum will be examined. During the exam, your health care provider may:  Remove tissue to be examined under a microscope (biopsy) for inflammation, infection, or other medical problems.  Remove growths.  Remove objects (foreign bodies) that are stuck.  Treat any bleeding with medicines or other devices that stop tissues from bleeding (hot cautery, clipping devices).  Widen (dilate) or stretch narrowed areas of your esophagus and stomach.  The endoscope will be withdrawn. AFTER THE PROCEDURE  You will be taken to a recovery area for observation. Your blood pressure, heart rate, breathing rate, and blood oxygen level will be monitored often until the medicines you were given have worn off.  Do not eat or drink anything until the numbing medicine has worn off and your gag reflex has returned. You may choke.  Your health care provider should be able to discuss his or her findings with you. It will take longer to discuss the test results if any biopsies were taken.   This information is not intended to replace advice given to you by your health care provider. Make sure you discuss any questions you have with your health care provider.   Document Released: 12/18/2004 Document Revised: 09/07/2014 Document Reviewed: 07/20/2012 Elsevier Interactive Patient Education 2016 [place]. Esophagogastroduodenoscopy, Care After Refer to this sheet in the next few weeks. These instructions provide you with information about caring for yourself after your procedure. Your health care provider may also give you more specific instructions. Your treatment has been planned according to current medical practices, but problems sometimes occur. Call your health care provider if you have any problems or questions after your procedure. WHAT TO EXPECT AFTER THE PROCEDURE After your procedure, it is typical to feel:  Soreness in your throat.  Pain with swallowing.  Sick to your stomach  (nauseous).  Bloated.  Dizzy.  Fatigued. HOME CARE INSTRUCTIONS  Do not eat or drink anything until the numbing medicine (local anesthetic) has worn off and your gag reflex has returned. You will know that the local anesthetic has worn off when you can swallow comfortably.  Do not drive or operate machinery until directed by your health care provider.  Take medicines only as directed by your health care provider. SEEK MEDICAL CARE IF:   You cannot stop coughing.  You are not urinating at all or less than usual. SEEK IMMEDIATE MEDICAL CARE IF:  You have difficulty swallowing.  You cannot eat or drink.  You have worsening throat or chest pain.  You have dizziness or lightheadedness or you faint.  You have nausea or vomiting.  You have chills.  You have a fever.  You have severe abdominal pain.  You have black, tarry, or bloody stools.   This information is not intended to replace advice given to you by your health care provider. Make sure you discuss any questions you have with your health care provider.   Document Released: 08/03/2012 Document Revised: 09/07/2014 Document Reviewed: 08/03/2012 Elsevier Interactive Patient Education 2016 [employer] Monitored Anesthesia Care Monitored anesthesia care is an anesthesia service for a medical procedure. Anesthesia is the loss of the ability to feel pain. It is produced by medicines called anesthetics. It may affect a small area of your body (local anesthesia), a large area of your body (regional anesthesia), or your entire body (general anesthesia). The need for monitored anesthesia care depends your procedure, your condition, and the potential need for regional or general anesthesia. It is often provided during procedures where:   General anesthesia may be needed if there are complications. This is because you need special care when you are under general anesthesia.   You will be under local or regional anesthesia. This  is so that you are able to have higher levels of anesthesia if needed.   You will receive calming medicines (sedatives). This is especially the case if sedatives are given to put you in a semi-conscious state of relaxation (deep sedation). This is because the amount of sedative needed to produce this state can be hard to predict. Too much of a sedative can produce general anesthesia. Monitored anesthesia care is performed by one or more health care providers who have special training in all types of anesthesia. You will need to meet with these health care providers before your procedure. During this meeting, they will ask you about your medical history. They will also give you instructions to follow. (For example, you will need to stop eating and drinking before your procedure. You may also need to stop or change medicines you are taking.) During your procedure, your health care providers will stay with you. They will:   Watch your condition. This includes watching your blood pressure, breathing, and level of pain.   Diagnose and treat problems that occur.   Give medicines if they are needed. These may include calming medicines (sedatives) and anesthetics.   Make sure you are comfortable.  Having monitored anesthesia care does not necessarily mean that you will be under anesthesia. It does mean that your health care providers will be able to manage anesthesia if you need it or if it occurs. It also means that you will be able to have a different type of anesthesia than you are having if you need it. When your procedure is complete, your health care providers will continue to watch your condition. They will make sure any medicines wear off before you are allowed to go home.    This information is not intended to replace advice given to you by your health care provider. Make sure you discuss any questions you have with your health care provider.   Document Released: 05/13/2005 Document Revised:  09/07/2014 Document Reviewed: 09/28/2012 Elsevier Interactive Patient Education 2016 [employer] PATIENT INSTRUCTIONS POST-ANESTHESIA  IMMEDIATELY FOLLOWING SURGERY:  Do not drive or operate machinery for the first twenty four hours after surgery.  Do not make any important decisions for twenty four hours after surgery or while taking narcotic pain medications or sedatives.  If you develop intractable nausea and vomiting or a severe headache please notify your doctor immediately.  FOLLOW-UP:  Please make an appointment with your surgeon as instructed. You do not need to follow up with anesthesia unless specifically instructed to do so.  WOUND CARE INSTRUCTIONS (if applicable):  Keep a dry clean dressing on the anesthesia/puncture wound site if there is drainage.  Once the wound has quit draining you may leave it open to air.  Generally you should leave the bandage intact for twenty four  hours unless there is drainage.  If the epidural site drains for more than 36-48 hours please call the anesthesia department.  QUESTIONS?:  Please feel free to call your physician or the hospital operator if you have any questions, and they will be happy to assist you.

## 2016-04-15 NOTE — Telephone Encounter (Signed)
yes

## 2016-04-15 NOTE — Telephone Encounter (Signed)
Routing to Dr Harrison for approval 

## 2016-04-16 ENCOUNTER — Other Ambulatory Visit: Payer: Self-pay | Admitting: *Deleted

## 2016-04-16 ENCOUNTER — Other Ambulatory Visit: Payer: Self-pay

## 2016-04-16 ENCOUNTER — Encounter (HOSPITAL_COMMUNITY)
Admission: RE | Admit: 2016-04-16 | Discharge: 2016-04-16 | Disposition: A | Discharge status: Home / Self Care | Payer: Medicare Other | Source: Ambulatory Visit | Attending: Gastroenterology | Admitting: Gastroenterology

## 2016-04-16 ENCOUNTER — Encounter (HOSPITAL_COMMUNITY): Payer: Self-pay

## 2016-04-16 DIAGNOSIS — Z01812 Encounter for preprocedural laboratory examination: Secondary | ICD-10-CM | POA: Diagnosis present

## 2016-04-16 DIAGNOSIS — Z01810 Encounter for preprocedural cardiovascular examination: Secondary | ICD-10-CM | POA: Insufficient documentation

## 2016-04-16 LAB — CBC WITH DIFFERENTIAL/PLATELET
BASOS ABS: 0 10*3/uL (ref 0.0–0.1)
BASOS PCT: 1 %
EOS PCT: 4 %
Eosinophils Absolute: 0.2 10*3/uL (ref 0.0–0.7)
HCT: 43 % (ref 39.0–52.0)
Hemoglobin: 14.1 g/dL (ref 13.0–17.0)
LYMPHS PCT: 32 %
Lymphs Abs: 2 10*3/uL (ref 0.7–4.0)
MCH: 29.8 pg (ref 26.0–34.0)
MCHC: 32.8 g/dL (ref 30.0–36.0)
MCV: 90.9 fL (ref 78.0–100.0)
Monocytes Absolute: 0.6 10*3/uL (ref 0.1–1.0)
Monocytes Relative: 9 %
NEUTROS ABS: 3.3 10*3/uL (ref 1.7–7.7)
Neutrophils Relative %: 54 %
PLATELETS: 138 10*3/uL — AB (ref 150–400)
RBC: 4.73 MIL/uL (ref 4.22–5.81)
RDW: 13.7 % (ref 11.5–15.5)
WBC: 6.1 10*3/uL (ref 4.0–10.5)

## 2016-04-16 LAB — BASIC METABOLIC PANEL
ANION GAP: 9 (ref 5–15)
BUN: 16 mg/dL (ref 6–20)
CALCIUM: 9.2 mg/dL (ref 8.9–10.3)
CO2: 22 mmol/L (ref 22–32)
Chloride: 105 mmol/L (ref 101–111)
Creatinine, Ser: 1.52 mg/dL — ABNORMAL HIGH (ref 0.61–1.24)
GFR, EST AFRICAN AMERICAN: 53 mL/min — AB (ref >60)
GFR, EST NON AFRICAN AMERICAN: 46 mL/min — AB (ref >60)
GLUCOSE: 122 mg/dL — AB (ref 65–99)
POTASSIUM: 3.9 mmol/L (ref 3.5–5.1)
SODIUM: 136 mmol/L (ref 135–145)

## 2016-04-16 MED ORDER — HYDROCODONE-ACETAMINOPHEN 5-325 MG PO TABS: 1.0000 | ORAL_TABLET | Freq: Four times a day (QID) | ORAL | 0 refills | Status: DC | PRN

## 2016-04-16 NOTE — Telephone Encounter (Signed)
PRESCRIPTION AVAILABLE 

## 2016-04-18 ENCOUNTER — Encounter (HOSPITAL_COMMUNITY): Payer: Self-pay

## 2016-04-18 ENCOUNTER — Emergency Department (HOSPITAL_COMMUNITY)
Admission: EM | Admit: 2016-04-18 | Discharge: 2016-04-18 | Disposition: A | Discharge status: Home / Self Care | Payer: Medicare Other | Attending: Emergency Medicine | Admitting: Emergency Medicine

## 2016-04-18 DIAGNOSIS — Z79899 Other long term (current) drug therapy: Secondary | ICD-10-CM | POA: Diagnosis not present

## 2016-04-18 DIAGNOSIS — I10 Essential (primary) hypertension: Secondary | ICD-10-CM | POA: Diagnosis not present

## 2016-04-18 DIAGNOSIS — E039 Hypothyroidism, unspecified: Secondary | ICD-10-CM | POA: Insufficient documentation

## 2016-04-18 DIAGNOSIS — R21 Rash and other nonspecific skin eruption: Secondary | ICD-10-CM | POA: Diagnosis present

## 2016-04-18 DIAGNOSIS — B029 Zoster without complications: Secondary | ICD-10-CM | POA: Insufficient documentation

## 2016-04-18 MED ORDER — GABAPENTIN 300 MG PO CAPS: 300.0000 mg | ORAL_CAPSULE | Freq: Two times a day (BID) | ORAL | 0 refills | Status: DC

## 2016-04-18 NOTE — ED Provider Notes (Signed)
[facility] DEPT Provider Note   CSN: [id number] Arrival date & time: 04/18/16  0236     History   Chief Complaint Chief Complaint  Patient presents with  . Rash    shingles    HPI [name] is a 65 y.o. male.  The history is provided by the patient.  Rash    He has history of hypertension, carcinoid tumor, peptic ulcer disease. One month ago, he was diagnosed with shingles on the right side of the abdomen. He was treated with redness own and gabapentin and also given prescription for oxycodone-acetaminophen. He states that he has run out of the prednisone and the oxycodone-acetaminophen. Rash is still present and painful although he will not give it has specific pain number.  Past Medical History:  Diagnosis Date  . Anemia FeDA: GASTRIC POLYPS, B12   TCS 2008 EGD 2009, 2008-HB 11.1 MCV 83.6 CR 1.22, 2009 FERRITIN 102-22  . B12 deficiency   . Carcinoid tumor determined by biopsy of stomach   . Chronic knee pain   . Diverticulosis 2008 LGIB  . DIVERTICULOSIS, COLON 08/15/2006   Qualifier: Diagnosis of  By: Jonna Munro MD, Roderic Scarce    . Gout   . Hepatomegaly 2o to FATTY LIVER DZ  . History of alcohol abuse   . History of septic arthritis   . HTN (hypertension)   . Hypothyroidism   . Neuroendocrine tumor 09/25/2014  . PUD 11/30/2008   Qualifier: History of  By: Jonna Munro MD, Cornelius      Patient Active Problem List   Diagnosis Date Noted  . Carcinoid tumor determined by biopsy of stomach 04/10/2016  . Tubulovillous adenoma of large intestine 09/12/2015  . Neuroendocrine tumor 09/25/2014  . Elevated LFTs 07/17/2014  . Thrombocytopenia, unspecified ([place]) 03/22/2013  . ANEMIA, IRON DEFICIENCY 07/29/2010  . Fatty liver 07/29/2010  . ABUSE, ALCOHOL, UNSPECIFIED 11/30/2008  . KNEE, ARTHRITIS, DEGEN./OSTEO 08/20/2008  . HYPERTENSION 10/20/2006  . B12 deficiency 08/15/2006  . GERD 08/15/2006  . IBS 08/15/2006  . HYPRTRPHY PROSTATE BNG W/O URINARY OBST/LUTS 08/15/2006   . OSTEOARTHRITIS 08/15/2006  . DEGENERATION, DISC NOS 08/15/2006    Past Surgical History:  Procedure Laterality Date  . BIOPSY  08/14/2014   Procedure: BIOPSY;  Surgeon: Danie Binder, MD;  Location: [facility];  Service: Endoscopy;;  . BIOPSY  03/12/2015   Procedure: BIOPSY;  Surgeon: Danie Binder, MD;  Location: [facility];  Service: Endoscopy;;  . CHOLECYSTECTOMY    . COLONOSCOPY  2008 [facility] DJ   LGIB 2o to TICS, prep good  . COLONOSCOPY WITH PROPOFOL N/A 08/14/2014   SLF: 1. Four large colon polyps removed. 2. The left colon is redundant 3. Moderate diverticulosis throughout teh entire examined colon  . COLONOSCOPY WITH PROPOFOL N/A 11/06/2014   SLF: 8 small polyps removed. One large pedunculated polyp removed from the ascending colon, tubulovillous and tubular adenomas. Next colonoscopy March 2019  . ESOPHAGOGASTRODUODENOSCOPY  12/08/2007   [id number] gastric polyps seen in the cardia and body of the stomach/Normal esophagus without evidence of Barrett's, mass, erosion/Normal duodenal bulb and second portion of the duodenum. Benign bx.  . ESOPHAGOGASTRODUODENOSCOPY (EGD) WITH PROPOFOL N/A 08/14/2014   SLF: 1. Heme postive stools due to gastric and colon polyps 2. Multiple gastric   . ESOPHAGOGASTRODUODENOSCOPY (EGD) WITH PROPOFOL N/A 03/12/2015   SLF: Multiple gastric nodules seen in gasric body/antrum. 2. Non-erosive gastritis ( inflammation) was found in the gastric antrum.   . ESOPHAGOGASTRODUODENOSCOPY (EGD) WITH PROPOFOL N/A 10/08/2015   Procedure:  ESOPHAGOGASTRODUODENOSCOPY (EGD) WITH PROPOFOL;  Surgeon: Danie Binder, MD;  [facility];  Service: Endoscopy;  Laterality: N/A;  0945  . HAND SURGERY    . POLYPECTOMY  08/14/2014   Procedure: POLYPECTOMY;  Surgeon: Danie Binder, MD;  Location: [facility];  Service: Endoscopy;;  . POLYPECTOMY N/A 11/06/2014   Procedure: POLYPECTOMY;  Surgeon: Danie Binder, MD;  Location: [facility];  Service: Endoscopy;  Laterality: N/A;  Transverse  Colon x3, Ascending Colon x2, Descending Colon x3  . REPLACEMENT TOTAL KNEE BILATERAL    . UPPER GASTROINTESTINAL ENDOSCOPY  APR 2009   INFLAMED HYPERPLASTIC POLYPS, CHRONIC GASTRITIS       Home Medications    Prior to Admission medications   Medication Sig Start Date End Date Taking? Authorizing Provider  acetaminophen (TYLENOL) 500 MG tablet Take 500 mg by mouth every 6 (six) hours as needed for moderate pain.    Historical Provider, MD  amLODipine (NORVASC) 10 MG tablet Take 10 mg by mouth daily.    Historical Provider, MD  cyanocobalamin (,VITAMIN B-12,) 1000 MCG/ML injection Inject 1,000 mcg into the muscle every 30 (thirty) days.      Historical Provider, MD  febuxostat (ULORIC) 40 MG tablet Take 40 mg by mouth daily.     Historical Provider, MD  gabapentin (NEURONTIN) 300 MG capsule Take 1 capsule (300 mg total) by mouth 2 (two) times daily. [id number]   Delora Fuel, MD  HYDROcodone-acetaminophen (NORCO/VICODIN) 5-325 MG tablet Take 1 tablet by mouth every 6 (six) hours as needed for moderate pain. 04/16/16   Carole Civil, MD  levothyroxine (SYNTHROID, LEVOTHROID) 175 MCG tablet Take 175 mcg by mouth daily before breakfast.  05/02/14   Historical Provider, MD  metoprolol succinate (TOPROL-XL) 25 MG 24 hr tablet Take 25 mg by mouth daily.     Historical Provider, MD  omeprazole (PRILOSEC) 20 MG capsule Take 20 mg by mouth daily.  04/15/15   Historical Provider, MD  Polysacchar Iron-FA-B12 (FERREX 150 FORTE) 150-1-25 MG-MG-MCG CAPS Take 1 capsule by mouth daily. 02/04/16   Baird Cancer, PA-C  predniSONE (DELTASONE) 20 MG tablet Take 1 tablet by mouth daily. 03/27/16   Historical Provider, MD  tamsulosin (FLOMAX) 0.4 MG CAPS capsule Take 0.4 mg by mouth daily.    Historical Provider, MD    Family History Family History  Problem Relation Age of Onset  . Hypertension Sister   . Hypertension Brother   . Diabetes Brother   . Colon polyps Neg Hx   . Colon cancer Neg Hx   .  Pancreatic disease Neg Hx     Social History Social History  Substance Use Topics  . Smoking status: Never Smoker  . Smokeless tobacco: Never Used     Comment: Quit x 10 years/ never smoked on regular basis  . Alcohol use 1.2 oz/week    2 Shots of liquor per week     Comment: drinks on weekends, gin/vodka 1/5th.     Allergies   Aspirin   Review of Systems Review of Systems  Skin: Positive for rash.  All other systems reviewed and are negative.    Physical Exam Updated Vital Signs BP (!) 150/105 (BP Location: Right Arm)   Pulse 105   Temp 98.7 F (37.1 C) (Oral)   Resp 15   Ht 6' 0.5" (1.842 m)   Wt 259 lb (117.5 kg)   SpO2 94%   BMI 34.64 kg/m   Physical Exam  Nursing note and  vitals reviewed.  65 year old male, resting comfortably and in no acute distress. Vital signs are significant for hypertension and tachycardia. Oxygen saturation is 94%, which is normal. Head is normocephalic and atraumatic. PERRLA, EOMI. Oropharynx is clear. Neck is nontender and supple without adenopathy or JVD. Back is nontender and there is no CVA tenderness. Lungs are clear without rales, wheezes, or rhonchi. Chest is nontender. Heart has regular rate and rhythm without murmur. Abdomen is soft, flat, nontender without masses or hepatosplenomegaly and peristalsis is normoactive. Extremities have no cyanosis or edema, full range of motion is present. Skin is warm and dry. Vesicular rash is present on the right upper abdomen along the costal margin in a dermatome pattern consistent with herpes zoster. Neurologic: Mental status is normal, cranial nerves are intact, there are no motor or sensory deficits.  ED Treatments / Results   Procedures Procedures (including critical care time)  Medications Ordered in ED Medications - No data to display   Initial Impression / Assessment and Plan / ED Course  I have reviewed the triage vital signs and the nursing notes.  Clinical Course    Herpes zoster with persistent pain. I reviewed his record on the [place] controlled substance reporting website, and he had a prescription for hydrocodone-acetaminophen filled on August 17 for 56 tablets. I've informed him that he heard he had this medication at home and would not refill the prescription for oxycodone have acetaminophen. Also, advised that prednisone was no longer indicated as it is only helpful in the initial stages of herpes zoster infections. However, since he is continuing to have neuropathic pain that is not responding to current dose of gabapentin (300 mg a day), he is given a prescription for gabapentin 300 mg to take twice a day. Follow-up with his PCP in one week. He may need to have his gabapentin dose titrated up from there.  Final Clinical Impressions(s) / ED Diagnoses   Final diagnoses:  Herpes zoster    New Prescriptions Current Discharge Medication List       Delora Fuel, MD [id number]

## 2016-04-18 NOTE — ED Notes (Signed)
Patient verbalizes understanding of discharge instructions, prescriptions, home care and follow up care. Patient out of department at this time. 

## 2016-04-18 NOTE — ED Triage Notes (Signed)
Pt states he was diagnose with shingles a month ago and still has some rash under his right breast.  Pt states he has run out of the medicine that his regular doctor gave him for the shingles.

## 2016-04-20 ENCOUNTER — Encounter (HOSPITAL_COMMUNITY): Payer: Self-pay | Admitting: Anesthesiology

## 2016-04-20 NOTE — Anesthesia Preprocedure Evaluation (Signed)
Anesthesia Evaluation  Patient identified by MRN, date of birth, ID band Patient awake    Reviewed: Allergy & Precautions, H&P , NPO status , Patient's Chart, lab work & pertinent test results, reviewed documented beta blocker date and time   Airway Mallampati: II  TM Distance: >3 FB     Dental  (+) Teeth Intact, Missing   Pulmonary former smoker,    breath sounds clear to auscultation       Cardiovascular hypertension, Pt. on medications and Pt. on home beta blockers  Rhythm:Regular Rate:Normal     Neuro/Psych negative neurological ROS  negative psych ROS   GI/Hepatic PUD, GERD  ,(+)     substance abuse  alcohol use, Hx/o Fatty liver Elevated LFT's Carcinoid of stomach Hx/o colonic diverticular disease   Endo/Other  Hypothyroidism Hx pancreatitis Hx/o B12 deficiency  Renal/GU Renal InsufficiencyRenal disease     Musculoskeletal  (+) Arthritis , Chronic back pain   Abdominal   Peds  Hematology  (+) anemia , Thrombocytopenia   Anesthesia Other Findings   Reproductive/Obstetrics                             Anesthesia Physical  Anesthesia Plan  ASA: III  Anesthesia Plan: MAC   Post-op Pain Management:    Induction: Intravenous  Airway Management Planned: Natural Airway and Nasal Cannula  Additional Equipment:   Intra-op Plan:   Post-operative Plan:   Informed Consent: I have reviewed the patients History and Physical, chart, labs and discussed the procedure including the risks, benefits and alternatives for the proposed anesthesia with the patient or authorized representative who has indicated his/her understanding and acceptance.     Plan Discussed with: Anesthesiologist, CRNA and Surgeon  Anesthesia Plan Comments:         Anesthesia Quick Evaluation

## 2016-04-21 ENCOUNTER — Encounter (HOSPITAL_COMMUNITY): Payer: Self-pay | Admitting: *Deleted

## 2016-04-21 ENCOUNTER — Ambulatory Visit (HOSPITAL_COMMUNITY)
Admission: RE | Admit: 2016-04-21 | Discharge: 2016-04-21 | Disposition: A | Discharge status: Home / Self Care | Payer: Medicare Other | Source: Ambulatory Visit | Attending: Gastroenterology | Admitting: Gastroenterology

## 2016-04-21 ENCOUNTER — Encounter (HOSPITAL_COMMUNITY)
Admission: RE | Disposition: A | Discharge status: Home / Self Care | Payer: Self-pay | Source: Ambulatory Visit | Attending: Gastroenterology

## 2016-04-21 ENCOUNTER — Ambulatory Visit (HOSPITAL_COMMUNITY): Payer: Medicare Other | Admitting: Anesthesiology

## 2016-04-21 DIAGNOSIS — K317 Polyp of stomach and duodenum: Secondary | ICD-10-CM | POA: Insufficient documentation

## 2016-04-21 DIAGNOSIS — C7A092 Malignant carcinoid tumor of the stomach: Secondary | ICD-10-CM | POA: Insufficient documentation

## 2016-04-21 DIAGNOSIS — Z87891 Personal history of nicotine dependence: Secondary | ICD-10-CM | POA: Insufficient documentation

## 2016-04-21 DIAGNOSIS — E538 Deficiency of other specified B group vitamins: Secondary | ICD-10-CM | POA: Diagnosis not present

## 2016-04-21 DIAGNOSIS — Z79899 Other long term (current) drug therapy: Secondary | ICD-10-CM | POA: Diagnosis not present

## 2016-04-21 DIAGNOSIS — K3189 Other diseases of stomach and duodenum: Secondary | ICD-10-CM | POA: Diagnosis not present

## 2016-04-21 DIAGNOSIS — K2950 Unspecified chronic gastritis without bleeding: Secondary | ICD-10-CM | POA: Diagnosis not present

## 2016-04-21 DIAGNOSIS — I10 Essential (primary) hypertension: Secondary | ICD-10-CM | POA: Diagnosis not present

## 2016-04-21 DIAGNOSIS — M109 Gout, unspecified: Secondary | ICD-10-CM | POA: Diagnosis not present

## 2016-04-21 DIAGNOSIS — M1990 Unspecified osteoarthritis, unspecified site: Secondary | ICD-10-CM | POA: Insufficient documentation

## 2016-04-21 DIAGNOSIS — Z85020 Personal history of malignant carcinoid tumor of stomach: Secondary | ICD-10-CM | POA: Diagnosis not present

## 2016-04-21 DIAGNOSIS — E039 Hypothyroidism, unspecified: Secondary | ICD-10-CM | POA: Diagnosis not present

## 2016-04-21 DIAGNOSIS — K2970 Gastritis, unspecified, without bleeding: Secondary | ICD-10-CM

## 2016-04-21 HISTORY — PX: BIOPSY: SHX5522

## 2016-04-21 HISTORY — PX: ESOPHAGOGASTRODUODENOSCOPY (EGD) WITH PROPOFOL: SHX5813

## 2016-04-21 SURGERY — ESOPHAGOGASTRODUODENOSCOPY (EGD) WITH PROPOFOL
Anesthesia: Monitor Anesthesia Care

## 2016-04-21 MED ORDER — STERILE WATER FOR IRRIGATION IR SOLN
Status: DC | PRN
  Administered 2016-04-21: 100 mL

## 2016-04-21 MED ORDER — LIDOCAINE HCL (CARDIAC) 10 MG/ML IV SOLN
INTRAVENOUS | Status: DC | PRN
  Administered 2016-04-21: 50 mg via INTRAVENOUS

## 2016-04-21 MED ORDER — GLYCOPYRROLATE 0.2 MG/ML IJ SOLN
INTRAMUSCULAR | Status: DC | PRN
  Administered 2016-04-21: 0.2 mg via INTRAVENOUS

## 2016-04-21 MED ORDER — ONDANSETRON HCL 4 MG/2ML IJ SOLN
4.0000 mg | Freq: Once | INTRAMUSCULAR | Status: DC | PRN
Start: 2016-04-21 — End: 2016-04-21

## 2016-04-21 MED ORDER — MIDAZOLAM HCL 5 MG/5ML IJ SOLN
INTRAMUSCULAR | Status: DC | PRN
  Administered 2016-04-21 (×2): 2 mg via INTRAVENOUS

## 2016-04-21 MED ORDER — PROPOFOL 10 MG/ML IV BOLUS
INTRAVENOUS | Status: AC
  Filled 2016-04-21: qty 20

## 2016-04-21 MED ORDER — LIDOCAINE VISCOUS 2 % MT SOLN
15.0000 mL | Freq: Once | OROMUCOSAL | Status: AC
  Administered 2016-04-21: 15 mL via OROMUCOSAL

## 2016-04-21 MED ORDER — LIDOCAINE HCL (PF) 1 % IJ SOLN
INTRAMUSCULAR | Status: AC
  Filled 2016-04-21: qty 5

## 2016-04-21 MED ORDER — PROPOFOL 500 MG/50ML IV EMUL
INTRAVENOUS | Status: DC | PRN
  Administered 2016-04-21: 08:00:00 via INTRAVENOUS
  Administered 2016-04-21: 150 ug/kg/min via INTRAVENOUS

## 2016-04-21 MED ORDER — ONDANSETRON HCL 4 MG/2ML IJ SOLN: 4.0000 mg | Freq: Once | INTRAMUSCULAR | Status: DC | PRN

## 2016-04-21 MED ORDER — LACTATED RINGERS IV SOLN
INTRAVENOUS | Status: DC
  Administered 2016-04-21 (×2): via INTRAVENOUS

## 2016-04-21 MED ORDER — LIDOCAINE VISCOUS 2 % MT SOLN
OROMUCOSAL | Status: AC
  Filled 2016-04-21: qty 15

## 2016-04-21 MED ORDER — PROPOFOL 10 MG/ML IV BOLUS
INTRAVENOUS | Status: AC
  Filled 2016-04-21: qty 40

## 2016-04-21 MED ORDER — MIDAZOLAM HCL 2 MG/2ML IJ SOLN
INTRAMUSCULAR | Status: AC
  Filled 2016-04-21: qty 4

## 2016-04-21 NOTE — Op Note (Signed)
[facility] Patient Name: [name] Procedure Date: 04/21/2016 7:18 AM MRN: [id number] Date of Birth: 1951-08-14 Attending MD: Barney Drain , MD CSN: [id number] Age: 65 Admit Type: Outpatient Procedure:                Upper GI endoscopy WITH COLD FORCEPS BIOPSY Indications:              Malignant carcinoid tumor of the stomach Providers:                Barney Drain, MD Referring MD:             Ancil Linsey MD, Teodora Medici. Kim Medicines:                Propofol per Anesthesia Complications:            No immediate complications. Estimated Blood Loss:     Estimated blood loss was minimal. Procedure:                Pre-Anesthesia Assessment:                           - Prior to the procedure, a History and Physical                            was performed, and patient medications and                            allergies were reviewed. The patient's tolerance of                            previous anesthesia was also reviewed. The risks                            and benefits of the procedure and the sedation                            options and risks were discussed with the patient.                            All questions were answered, and informed consent                            was obtained. Prior Anticoagulants: The patient has                            taken no previous anticoagulant or antiplatelet                            agents. ASA Grade Assessment: II - A patient with                            mild systemic disease. After reviewing the risks                            and benefits, the patient was deemed in  satisfactory condition to undergo the procedure.                            After obtaining informed consent, the endoscope was                            passed under direct vision. Throughout the                            procedure, the patient's blood pressure, pulse, and                            oxygen saturations were  monitored continuously. The                            [id number]) scope was introduced through the                            mouth, and advanced to the second part of duodenum.                            The upper GI endoscopy was accomplished without                            difficulty. The patient tolerated the procedure                            well. Scope In: [id number] AM Scope Out: 7:54:12 AM Total Procedure Duration: 0 hours 17 minutes 15 seconds  Findings:      The examined esophagus was normal.      Nodular mucosa was found in the cardia(2), in the gastric fundus(2) and       in the gastric body(1). Four biopsies were obtained with cold forceps       for histology in the gastric antrum(PATH BOTTLE #1), as well as four       biopsies in the gastric body(PATH BOTTLE#2), four biopsies in the       gastric fundus(PATH BOTTLE #3) and four biopsies in the cardia(PATH       BOTTLE #4). METAL CLIP REMAIN IN THE CARDIA(1) AND FUNDUS(3) FROM EGD       FEB 2017. IMAGE 5 SHOWS TATTOED AREA OF GASTRIC MUCOSA FROM EGD DEC 2015       AND ONE SMALL NODULE.      The examined duodenum was normal. Impression:               - GASTRIC CARCINOID-Nodular mucosa in the cardia,                            in the gastric fundus and in the gastric body.                            NODULES PERSIST BUT UNABLE TO APPRECIATE ADDITIONAL                            LESIONS.                           -  METAL CLIPS REMIAN IN STOMACH FROM FEB 2017 Moderate Sedation:      Per Anesthesia Care Recommendation:           - Resume previous diet.                           - Continue present medications.                           - Await pathology results.                           -NO MRI unless abdominal xray shows no METAL CLIPs                            IN STOMACH.                           - Return to my office in 6 months.                           - Patient has a contact number available for                             emergencies. The signs and symptoms of potential                            delayed complications were discussed with the                            patient. Return to normal activities tomorrow.                            Written discharge instructions were provided to the                            patient. Procedure Code(s):        --- Professional ---                           [phone], Esophagogastroduodenoscopy, flexible,                            transoral; with biopsy, single or multiple Diagnosis Code(s):        --- Professional ---                           K31.89, Other diseases of stomach and duodenum                           C7A.092, Malignant carcinoid tumor of the stomach CPT copyright 2016 American Medical Association. All rights reserved. The codes documented in this report are preliminary and upon coder review may  be revised to meet current compliance requirements. Barney Drain, MD Barney Drain, MD 04/21/2016 8:14:43 AM This report has been signed electronically. Number of Addenda: 0

## 2016-04-21 NOTE — Anesthesia Procedure Notes (Signed)
Procedure Name: MAC Date/Time: 04/21/2016 7:28 AM Performed by: Andree Elk, Jeanenne Licea A Pre-anesthesia Checklist: Patient identified, Emergency Drugs available, Suction available and Patient being monitored Oxygen Delivery Method: Simple face mask

## 2016-04-21 NOTE — Anesthesia Postprocedure Evaluation (Signed)
Anesthesia Post Note  Patient: [name]  Procedure(s) Performed: Procedure(s) (LRB): ESOPHAGOGASTRODUODENOSCOPY (EGD) WITH PROPOFOL (N/A) BIOPSY  Patient location during evaluation: PACU Anesthesia Type: MAC Level of consciousness: awake and alert and oriented Pain management: pain level controlled Vital Signs Assessment: post-procedure vital signs reviewed and stable Respiratory status: spontaneous breathing, nonlabored ventilation and respiratory function stable Cardiovascular status: stable and blood pressure returned to baseline Postop Assessment: no signs of nausea or vomiting Anesthetic complications: no    Last Vitals:  Vitals:   04/21/16 0626 04/21/16 0800  BP: (!) 136/95 120/84  Pulse: 82 93  Resp: 16 (!) 23  Temp: 36.6 C 36.6 C    Last Pain:  Vitals:   04/21/16 0626  TempSrc: Oral                 Naftoli Penny A.

## 2016-04-21 NOTE — Anesthesia Postprocedure Evaluation (Signed)
Anesthesia Post Note  Patient: [name]  Procedure(s) Performed: Procedure(s) (LRB): ESOPHAGOGASTRODUODENOSCOPY (EGD) WITH PROPOFOL (N/A) BIOPSY  Patient location during evaluation: PACU Anesthesia Type: MAC Level of consciousness: awake and alert and oriented Pain management: pain level controlled Vital Signs Assessment: post-procedure vital signs reviewed and stable Respiratory status: spontaneous breathing and patient connected to nasal cannula oxygen Cardiovascular status: stable Anesthetic complications: no    Last Vitals:  Vitals:   04/21/16 0626  BP: (!) 136/95  Pulse: 82  Resp: 16  Temp: 36.6 C    Last Pain:  Vitals:   04/21/16 0626  TempSrc: Oral                 ADAMS, AMY A

## 2016-04-21 NOTE — H&P (Signed)
Primary Care Physician:  [name], MD Primary Gastroenterologist:  Dr. Oneida Alar  Pre-Procedure History & Physical: HPI:  [name] is a 65 y.o. male here for GASTRIC CARCINOID.  Past Medical History:  Diagnosis Date  . Anemia FeDA: GASTRIC POLYPS, B12   TCS 2008 EGD 2009, 2008-HB 11.1 MCV 83.6 CR 1.22, 2009 FERRITIN 102-22  . B12 deficiency   . Carcinoid tumor determined by biopsy of stomach   . Chronic knee pain   . Diverticulosis 2008 LGIB  . DIVERTICULOSIS, COLON 08/15/2006   Qualifier: Diagnosis of  By: Jonna Munro MD, Roderic Scarce    . Gout   . Hepatomegaly 2o to FATTY LIVER DZ  . History of alcohol abuse   . History of septic arthritis   . HTN (hypertension)   . Hypothyroidism   . Neuroendocrine tumor 09/25/2014  . PUD 11/30/2008   Qualifier: History of  By: Jonna Munro MD, Roderic Scarce      Past Surgical History:  Procedure Laterality Date  . BIOPSY  08/14/2014   Procedure: BIOPSY;  Surgeon: Danie Binder, MD;  Location: [facility];  Service: Endoscopy;;  . BIOPSY  03/12/2015   Procedure: BIOPSY;  Surgeon: Danie Binder, MD;  Location: [facility];  Service: Endoscopy;;  . CHOLECYSTECTOMY    . COLONOSCOPY  2008 [facility] DJ   LGIB 2o to TICS, prep good  . COLONOSCOPY WITH PROPOFOL N/A 08/14/2014   SLF: 1. Four large colon polyps removed. 2. The left colon is redundant 3. Moderate diverticulosis throughout teh entire examined colon  . COLONOSCOPY WITH PROPOFOL N/A 11/06/2014   SLF: 8 small polyps removed. One large pedunculated polyp removed from the ascending colon, tubulovillous and tubular adenomas. Next colonoscopy March 2019  . ESOPHAGOGASTRODUODENOSCOPY  12/08/2007   [id number] gastric polyps seen in the cardia and body of the stomach/Normal esophagus without evidence of Barrett's, mass, erosion/Normal duodenal bulb and second portion of the duodenum. Benign bx.  . ESOPHAGOGASTRODUODENOSCOPY (EGD) WITH PROPOFOL N/A 08/14/2014   SLF: 1. Heme postive stools due to gastric and colon polyps 2.  Multiple gastric   . ESOPHAGOGASTRODUODENOSCOPY (EGD) WITH PROPOFOL N/A 03/12/2015   SLF: Multiple gastric nodules seen in gasric body/antrum. 2. Non-erosive gastritis ( inflammation) was found in the gastric antrum.   . ESOPHAGOGASTRODUODENOSCOPY (EGD) WITH PROPOFOL N/A 10/08/2015   Procedure: ESOPHAGOGASTRODUODENOSCOPY (EGD) WITH PROPOFOL;  Surgeon: Danie Binder, MD;  [facility];  Service: Endoscopy;  Laterality: N/A;  0945  . HAND SURGERY    . POLYPECTOMY  08/14/2014   Procedure: POLYPECTOMY;  Surgeon: Danie Binder, MD;  Location: [facility];  Service: Endoscopy;;  . POLYPECTOMY N/A 11/06/2014   Procedure: POLYPECTOMY;  Surgeon: Danie Binder, MD;  Location: [facility];  Service: Endoscopy;  Laterality: N/A;  Transverse Colon x3, Ascending Colon x2, Descending Colon x3  . REPLACEMENT TOTAL KNEE BILATERAL    . UPPER GASTROINTESTINAL ENDOSCOPY  APR 2009   INFLAMED HYPERPLASTIC POLYPS, CHRONIC GASTRITIS    Prior to Admission medications   Medication Sig Start Date End Date Taking? Authorizing Provider  acetaminophen (TYLENOL) 500 MG tablet Take 500 mg by mouth every 6 (six) hours as needed for moderate pain.   Yes Historical Provider, MD  amLODipine (NORVASC) 10 MG tablet Take 10 mg by mouth daily.   Yes Historical Provider, MD  cyanocobalamin (,VITAMIN B-12,) 1000 MCG/ML injection Inject 1,000 mcg into the muscle every 30 (thirty) days.     Yes Historical Provider, MD  febuxostat (ULORIC) 40 MG tablet Take 40  mg by mouth daily.    Yes Historical Provider, MD  gabapentin (NEURONTIN) 300 MG capsule Take 1 capsule (300 mg total) by mouth 2 (two) times daily. [id number]  Yes Delora Fuel, MD  HYDROcodone-acetaminophen (NORCO/VICODIN) 5-325 MG tablet Take 1 tablet by mouth every 6 (six) hours as needed for moderate pain. 04/16/16  Yes Carole Civil, MD  levothyroxine (SYNTHROID, LEVOTHROID) 175 MCG tablet Take 175 mcg by mouth daily before breakfast.  05/02/14  Yes Historical Provider, MD   metoprolol succinate (TOPROL-XL) 25 MG 24 hr tablet Take 25 mg by mouth daily.    Yes Historical Provider, MD  omeprazole (PRILOSEC) 20 MG capsule Take 20 mg by mouth daily.  04/15/15  Yes Historical Provider, MD  Polysacchar Iron-FA-B12 (FERREX 150 FORTE) 150-1-25 MG-MG-MCG CAPS Take 1 capsule by mouth daily. 02/04/16  Yes Manon Hilding Kefalas, PA-C  predniSONE (DELTASONE) 20 MG tablet Take 1 tablet by mouth daily. 03/27/16  Yes Historical Provider, MD  tamsulosin (FLOMAX) 0.4 MG CAPS capsule Take 0.4 mg by mouth daily.   Yes Historical Provider, MD    Allergies as of 04/10/2016 - Review Complete 04/10/2016  Allergen Reaction Noted  . Aspirin  06/01/2007    Family History  Problem Relation Age of Onset  . Hypertension Sister   . Hypertension Brother   . Diabetes Brother   . Colon polyps Neg Hx   . Colon cancer Neg Hx   . Pancreatic disease Neg Hx     Social History   Social History  . Marital status: Single    Spouse name: N/A  . Number of children: 0  . Years of education: N/A   Occupational History  . retired    Social History Main Topics  . Smoking status: Never Smoker  . Smokeless tobacco: Never Used     Comment: Quit x 10 years/ never smoked on regular basis  . Alcohol use 1.2 oz/week    2 Shots of liquor per week     Comment: drinks on weekends, gin/vodka 1/5th.  . Drug use: No  . Sexual activity: No   Other Topics Concern  . Not on file   Social History Narrative   HE DOES NOT HAVE ANY CHILDREN.    Review of Systems: See HPI, otherwise negative ROS   Physical Exam: BP (!) 136/95   Pulse 82   Temp 97.8 F (36.6 C) (Oral)   Resp 16   SpO2 98%  General:   Alert,  pleasant and cooperative in NAD Head:  Normocephalic and atraumatic. Neck:  Supple; Lungs:  Clear throughout to auscultation.    Heart:  Regular rate and rhythm. Abdomen:  Soft, nontender and nondistended. Normal bowel sounds, without guarding, and without rebound.   Neurologic:  Alert and   oriented x4;  grossly normal neurologically.  Impression/Plan:    GASTRIC CARCINOID  PLAN: 1. EGD FOR SURVEILLANCE

## 2016-04-21 NOTE — Transfer of Care (Signed)
Immediate Anesthesia Transfer of Care Note  Patient: [name]  Procedure(s) Performed: Procedure(s) with comments: ESOPHAGOGASTRODUODENOSCOPY (EGD) WITH PROPOFOL (N/A) - 730  BIOPSY - bx's of antrum, body of stomach, fundus, and cardia  Patient Location: PACU  Anesthesia Type:MAC  Level of Consciousness: awake, alert , oriented and patient cooperative  Airway & Oxygen Therapy: Patient Spontanous Breathing and Patient connected to nasal cannula oxygen  Post-op Assessment: Report given to RN and Post -op Vital signs reviewed and stable  Post vital signs: Reviewed and stable  Last Vitals:  Vitals:   04/21/16 0626  BP: (!) 136/95  Pulse: 82  Resp: 16  Temp: 36.6 C    Last Pain:  Vitals:   04/21/16 0626  TempSrc: Oral      Patients Stated Pain Goal: 5 ([id number])  Complications: No apparent anesthesia complications

## 2016-04-21 NOTE — Discharge Instructions (Signed)
You have gastritis AND THE STOMACH NODULES ARE THE SAME. THE METAL CLIPS I PLACED IN FEB ARE STILL IN YOUR STOMACH. I biopsied your stomach.    NO MRI unless a abdominal xray shows no METAL CLIPs IN YOUR STOMACH.  YOUR BIOPSY RESULTS WILL BE AVAILABLE IN 10-14 DAYS.   FOLLOW UP IN 6 MOS.  UPPER ENDOSCOPY AFTER CARE Read the instructions outlined below and refer to this sheet in the next week. These discharge instructions provide you with general information on caring for yourself after you leave the hospital. While your treatment has been planned according to the most current medical practices available, unavoidable complications occasionally occur. If you have any problems or questions after discharge, call DR. Tuyet Bader, [phone].  ACTIVITY  You may resume your regular activity, but move at a slower pace for the next 24 hours.   Take frequent rest periods for the next 24 hours.   Walking will help get rid of the air and reduce the bloated feeling in your belly (abdomen).   No driving for 24 hours (because of the medicine (anesthesia) used during the test).   You may shower.   Do not sign any important legal documents or operate any machinery for 24 hours (because of the anesthesia used during the test).    NUTRITION  Drink plenty of fluids.   You may resume your normal diet as instructed by your doctor.   Begin with a light meal and progress to your normal diet. Heavy or fried foods are harder to digest and may make you feel sick to your stomach (nauseated).   Avoid alcoholic beverages for 24 hours or as instructed.    MEDICATIONS  You may resume your normal medications.   WHAT YOU CAN EXPECT TODAY  Some feelings of bloating in the abdomen.   Passage of more gas than usual.    IF YOU HAD A BIOPSY TAKEN DURING THE UPPER ENDOSCOPY:  Eat a soft diet IF YOU HAVE NAUSEA, BLOATING, ABDOMINAL PAIN, OR VOMITING.    FINDING OUT THE RESULTS OF YOUR TEST Not all test  results are available during your visit. DR. Oneida Alar WILL CALL YOU WITHIN 14 DAYS OF YOUR PROCEDUE WITH YOUR RESULTS. Do not assume everything is normal if you have not heard from DR. Cedrik Heindl, CALL HER OFFICE AT [phone].  SEEK IMMEDIATE MEDICAL ATTENTION AND CALL THE OFFICE: [phone] IF:  You have more than a spotting of blood in your stool.   Your belly is swollen (abdominal distention).   You are nauseated or vomiting.   You have a temperature over 101F.   You have abdominal pain or discomfort that is severe or gets worse throughout the day.   Gastritis  Gastritis is an inflammation (the body's way of reacting to injury and/or infection) of the stomach. It is often caused by viral or bacterial (germ) infections. It can also be caused BY ASPIRIN, BC/GOODY POWDER'S, (IBUPROFEN) MOTRIN, OR ALEVE (NAPROXEN), chemicals (including alcohol), SPICY FOODS, and medications. This illness may be associated with generalized malaise (feeling tired, not well), UPPER ABDOMINAL STOMACH cramps, and fever. One common bacterial cause of gastritis is an organism known as H. Pylori. This can be treated with antibiotics.

## 2016-04-23 ENCOUNTER — Telehealth: Payer: Self-pay | Admitting: Gastroenterology

## 2016-04-23 NOTE — Telephone Encounter (Signed)
PLEASE CALL PT. HIS BIOPSIES SHOW GASTRITIS AND BENIGN POLYPS. THE BIOPSIES DID NOT SHOW ANY EVIDENCE FOR CARCINOID.

## 2016-04-24 NOTE — Telephone Encounter (Signed)
Pt is aware of results. 

## 2016-04-27 ENCOUNTER — Encounter (HOSPITAL_COMMUNITY): Payer: Self-pay | Admitting: Gastroenterology

## 2016-05-15 ENCOUNTER — Ambulatory Visit (HOSPITAL_COMMUNITY): Payer: Medicare Other | Admitting: Hematology & Oncology

## 2016-05-15 ENCOUNTER — Other Ambulatory Visit (HOSPITAL_COMMUNITY): Payer: Medicare Other

## 2016-05-19 ENCOUNTER — Other Ambulatory Visit: Payer: Self-pay | Admitting: *Deleted

## 2016-05-19 ENCOUNTER — Telehealth: Payer: Self-pay | Admitting: Orthopedic Surgery

## 2016-05-19 MED ORDER — HYDROCODONE-ACETAMINOPHEN 5-325 MG PO TABS: 1.0000 | ORAL_TABLET | Freq: Four times a day (QID) | ORAL | 0 refills | Status: DC | PRN

## 2016-05-19 NOTE — Telephone Encounter (Signed)
Refill

## 2016-05-19 NOTE — Telephone Encounter (Signed)
Routing to Dr Harrison for approval 

## 2016-05-19 NOTE — Telephone Encounter (Signed)
Patient called for refill:  HYDROcodone-acetaminophen (NORCO/VICODIN) 5-325 MG tablet 56 tablet

## 2016-06-12 ENCOUNTER — Ambulatory Visit (HOSPITAL_COMMUNITY): Payer: Medicare Other | Admitting: Hematology & Oncology

## 2016-06-12 ENCOUNTER — Other Ambulatory Visit (HOSPITAL_COMMUNITY): Payer: Medicare Other

## 2016-06-23 ENCOUNTER — Telehealth: Payer: Self-pay | Admitting: Orthopedic Surgery

## 2016-06-23 ENCOUNTER — Other Ambulatory Visit: Payer: Self-pay | Admitting: Orthopedic Surgery

## 2016-06-23 MED ORDER — HYDROCODONE-ACETAMINOPHEN 5-325 MG PO TABS: 1.0000 | ORAL_TABLET | Freq: Four times a day (QID) | ORAL | 0 refills | Status: DC | PRN

## 2016-06-23 NOTE — Telephone Encounter (Signed)
ROUTING TO DR HARRISON FOR APPROVAL 

## 2016-06-23 NOTE — Telephone Encounter (Signed)
Patient requests refill:  HYDROcodone-acetaminophen (NORCO/VICODIN) 5-325 MG tablet 56 tablet   - insurance: [employer] and [employer]

## 2016-06-24 ENCOUNTER — Other Ambulatory Visit (HOSPITAL_COMMUNITY): Payer: Self-pay | Admitting: *Deleted

## 2016-06-24 DIAGNOSIS — D509 Iron deficiency anemia, unspecified: Secondary | ICD-10-CM

## 2016-06-26 ENCOUNTER — Other Ambulatory Visit (HOSPITAL_COMMUNITY): Payer: Medicare Other

## 2016-06-26 ENCOUNTER — Ambulatory Visit (HOSPITAL_COMMUNITY): Payer: Medicare Other | Admitting: Oncology

## 2016-07-08 ENCOUNTER — Encounter (HOSPITAL_COMMUNITY): Payer: Medicare Other | Attending: Hematology & Oncology | Admitting: Oncology

## 2016-07-08 ENCOUNTER — Encounter (HOSPITAL_COMMUNITY): Payer: Medicare Other | Attending: Oncology

## 2016-07-08 ENCOUNTER — Encounter (HOSPITAL_COMMUNITY): Payer: Self-pay | Admitting: Oncology

## 2016-07-08 VITALS — BP 121/75 | HR 85 | Temp 98.3°F | Resp 16 | Ht 77.0 in | Wt 258.0 lb

## 2016-07-08 DIAGNOSIS — D509 Iron deficiency anemia, unspecified: Secondary | ICD-10-CM

## 2016-07-08 DIAGNOSIS — G8929 Other chronic pain: Secondary | ICD-10-CM | POA: Diagnosis not present

## 2016-07-08 DIAGNOSIS — Z86010 Personal history of colonic polyps: Secondary | ICD-10-CM | POA: Insufficient documentation

## 2016-07-08 DIAGNOSIS — M109 Gout, unspecified: Secondary | ICD-10-CM | POA: Diagnosis not present

## 2016-07-08 DIAGNOSIS — E039 Hypothyroidism, unspecified: Secondary | ICD-10-CM | POA: Insufficient documentation

## 2016-07-08 DIAGNOSIS — D3A8 Other benign neuroendocrine tumors: Secondary | ICD-10-CM

## 2016-07-08 DIAGNOSIS — D508 Other iron deficiency anemias: Secondary | ICD-10-CM

## 2016-07-08 DIAGNOSIS — C7A8 Other malignant neuroendocrine tumors: Secondary | ICD-10-CM | POA: Insufficient documentation

## 2016-07-08 DIAGNOSIS — M25569 Pain in unspecified knee: Secondary | ICD-10-CM | POA: Insufficient documentation

## 2016-07-08 DIAGNOSIS — E538 Deficiency of other specified B group vitamins: Secondary | ICD-10-CM | POA: Insufficient documentation

## 2016-07-08 DIAGNOSIS — C7A092 Malignant carcinoid tumor of the stomach: Secondary | ICD-10-CM

## 2016-07-08 DIAGNOSIS — I10 Essential (primary) hypertension: Secondary | ICD-10-CM | POA: Diagnosis not present

## 2016-07-08 LAB — COMPREHENSIVE METABOLIC PANEL
ALBUMIN: 4 g/dL (ref 3.5–5.0)
ALK PHOS: 66 U/L (ref 38–126)
ALT: 26 U/L (ref 17–63)
ANION GAP: 8 (ref 5–15)
AST: 49 U/L — ABNORMAL HIGH (ref 15–41)
BUN: 15 mg/dL (ref 6–20)
CALCIUM: 9.1 mg/dL (ref 8.9–10.3)
CO2: 27 mmol/L (ref 22–32)
Chloride: 103 mmol/L (ref 101–111)
Creatinine, Ser: 1.42 mg/dL — ABNORMAL HIGH (ref 0.61–1.24)
GFR calc Af Amer: 58 mL/min — ABNORMAL LOW (ref >60)
GFR calc non Af Amer: 50 mL/min — ABNORMAL LOW (ref >60)
GLUCOSE: 115 mg/dL — AB (ref 65–99)
Potassium: 3.5 mmol/L (ref 3.5–5.1)
SODIUM: 138 mmol/L (ref 135–145)
Total Bilirubin: 1 mg/dL (ref 0.3–1.2)
Total Protein: 8.1 g/dL (ref 6.5–8.1)

## 2016-07-08 LAB — IRON AND TIBC
Iron: 94 ug/dL (ref 45–182)
SATURATION RATIOS: 25 % (ref 17.9–39.5)
TIBC: 382 ug/dL (ref 250–450)
UIBC: 288 ug/dL

## 2016-07-08 LAB — CBC WITH DIFFERENTIAL/PLATELET
BASOS PCT: 1 %
Basophils Absolute: 0 10*3/uL (ref 0.0–0.1)
EOS ABS: 0.2 10*3/uL (ref 0.0–0.7)
Eosinophils Relative: 3 %
HCT: 44.3 % (ref 39.0–52.0)
HEMOGLOBIN: 14.8 g/dL (ref 13.0–17.0)
Lymphocytes Relative: 43 %
Lymphs Abs: 2.8 10*3/uL (ref 0.7–4.0)
MCH: 30.5 pg (ref 26.0–34.0)
MCHC: 33.4 g/dL (ref 30.0–36.0)
MCV: 91.2 fL (ref 78.0–100.0)
Monocytes Absolute: 0.6 10*3/uL (ref 0.1–1.0)
Monocytes Relative: 9 %
NEUTROS PCT: 45 %
Neutro Abs: 2.9 10*3/uL (ref 1.7–7.7)
Platelets: 164 10*3/uL (ref 150–400)
RBC: 4.86 MIL/uL (ref 4.22–5.81)
RDW: 13.7 % (ref 11.5–15.5)
WBC: 6.6 10*3/uL (ref 4.0–10.5)

## 2016-07-08 LAB — FERRITIN: FERRITIN: 43 ng/mL (ref 24–336)

## 2016-07-08 MED ORDER — POLYSACCHAR IRON-FA-B12 150-1-25 MG-MG-MCG PO CAPS: 1.0000 | ORAL_CAPSULE | Freq: Every day | ORAL | 5 refills | Status: DC

## 2016-07-08 NOTE — Patient Instructions (Signed)
[place] at [facility] Discharge Instructions  RECOMMENDATIONS MADE BY THE CONSULTANT AND ANY TEST RESULTS WILL BE SENT TO YOUR REFERRING PHYSICIAN.  You were seen today by Kirby Crigler PA-C. Return in 3 months for labs and follow up appointment.   Thank you for choosing [place] at [facility] to provide your oncology and hematology care.  To afford each patient quality time with our provider, please arrive at least 15 minutes before your scheduled appointment time.   Beginning January 23rd 2017 lab work for the [facility] will be done in the  Main lab at [facility] on 1st floor. If you have a lab appointment with the [place] please come in thru the  Main Entrance and check in at the main information desk  You need to re-schedule your appointment should you arrive 10 or more minutes late.  We strive to give you quality time with our providers, and arriving late affects you and other patients whose appointments are after yours.  Also, if you no show three or more times for appointments you may be dismissed from the clinic at the providers discretion.     Again, thank you for choosing [facility].  Our hope is that these requests will decrease the amount of time that you wait before being seen by our physicians.       _____________________________________________________________  Should you have questions after your visit to [facility], please contact our office at [phone] between the hours of 8:30 a.m. and 4:30 p.m.  Voicemails left after 4:30 p.m. will not be returned until the following business day.  For prescription refill requests, have your pharmacy contact our office.         Resources For Cancer Patients and their Caregivers ? American Cancer Society: Can assist with transportation, wigs, general needs, runs Look Good Feel Better.        5315693063 ? Cancer Care: Provides financial  assistance, online support groups, medication/co-pay assistance.  1-800-813-HOPE (743)575-0481) ? [facility] Assists [place] cancer patients and their families through emotional , educational and financial support.  208-771-8298 ? [place] DSS Where to apply for food stamps, Medicaid and utility assistance. (781)386-9333 ? [employer]: Transportation to medical appointments. 928-142-7570 ? Social Security Administration: May apply for disability if have a Stage IV cancer. 289-861-6027 586-530-3122 ? [employer], Disability and Transit Services: Assists with nutrition, care and transit needs. [place] Support Programs: @10RELATIVEDAYS @ > Cancer Support Group  2nd Tuesday of the month 1pm-2pm, Journey Room  > Creative Journey  3rd Tuesday of the month 1130am-1pm, Journey Room  > Look Good Feel Better  1st Wednesday of the month 10am-12 noon, Journey Room (Call [place] to register [phone])

## 2016-07-08 NOTE — Assessment & Plan Note (Signed)
Iron deficiency without anemia, on PO iron replacement (ferrex forte)  beginning on 02/04/2016.  Tolerating well.  Labs today: CBC diff, iron/TIBC, ferritin  Labs in 12 weeks: CBC diff, iron/TIBC, ferritin.  I will refill his ferrex forte RX.

## 2016-07-08 NOTE — Assessment & Plan Note (Addendum)
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  Octreotide scan on 09/19/2014 was negative for any avid metastasis.  His case was discussed at GI tumor board in Feb/March 2016 and recommendation was for anatomical biopsies.  This was completed by Dr. Oneida Alar on 04/21/2016 and biopsies of the antrum, body, fundus, and cardia were negative for malignancy (chronic active gastritis is noted).  Labs today: CBC diff, CMET, chromogranin A, serum serotonin, iron/TIBC, ferritin.  I personally reviewed and went over laboratory results with the patient.  The results are noted within this dictation.  He underwent repeat EGD by Dr. Oneida Alar on 04/21/2016 and biopsies of the antrum, body, fundus, and cardia were negative for malignancy (chronic active gastritis is noted).  She notes nodules that persist but she is unable to appreciate any additional lesions.  Labs in 12 weeks: CBC diff, CMET, serum serotonin, chromogranin A, iron/TIBC, ferritin.  He refuses influenza immunization today.  Return in 12 weeks for follow-up.

## 2016-07-08 NOTE — Progress Notes (Signed)
Philip Gravel, MD [address] [place] [address]  Neuroendocrine tumor - Plan: CBC with Differential, Comprehensive metabolic panel, Serotonin serum, Chromogranin A  Iron deficiency anemia, unspecified iron deficiency anemia type - Plan: CBC with Differential, Iron and TIBC, Ferritin  Other iron deficiency anemia - Plan: Polysacchar Iron-FA-B12 (FERREX 150 FORTE) 150-1-25 MG-MG-MCG CAPS  CURRENT THERAPY: Surveillance and continued PO iron replacement with ferrex forte beginning on 02/04/2016  INTERVAL HISTORY: [name] 65 y.o. male returns for followup of well-differentiated neuroendocrine carcinoid tumor on polypectomy by Dr. Oneida Alar on 08/14/2014 and 10/08/2015 and negative octreotide scan on 09/19/2014.  His case was presented at GI tumor board in Feb/March 2016 with the recommendation of anatomic biopsies to guide role of surgical resection.   AND Iron deficiency anemia, on ferrex forte beginning on 02/04/2016.  He is doing well.  He denies any complaints today.  He is tolerating PO iron well without any complaints including nausea/vomiting, diarrhea, constipation, abdominal pain.  He is advised to continue with PO iron replacement therapy.  Review of Systems  Constitutional: Negative for chills, fever and weight loss.  HENT: Negative.   Eyes: Negative.   Respiratory: Negative.  Negative for cough.   Cardiovascular: Negative.  Negative for chest pain.  Gastrointestinal: Negative.   Genitourinary: Negative.   Musculoskeletal: Negative.   Skin: Negative.   Neurological: Negative.  Negative for weakness.  Psychiatric/Behavioral: Negative.     Past Medical History:  Diagnosis Date  . Anemia FeDA: GASTRIC POLYPS, B12   TCS 2008 EGD 2009, 2008-HB 11.1 MCV 83.6 CR 1.22, 2009 FERRITIN 102-22  . B12 deficiency   . Carcinoid tumor determined by biopsy of stomach   . Chronic knee pain   . Diverticulosis 2008 LGIB  . DIVERTICULOSIS, COLON 08/15/2006   Qualifier: Diagnosis of  By: Jonna Munro MD, Roderic Scarce    . Gout   . Hepatomegaly 2o to FATTY LIVER DZ  . History of alcohol abuse   . History of septic arthritis   . HTN (hypertension)   . Hypothyroidism   . Neuroendocrine tumor 09/25/2014  . PUD 11/30/2008   Qualifier: History of  By: Jonna Munro MD, Roderic Scarce      Past Surgical History:  Procedure Laterality Date  . BIOPSY  08/14/2014   Procedure: BIOPSY;  Surgeon: Danie Binder, MD;  Location: [facility];  Service: Endoscopy;;  . BIOPSY  03/12/2015   Procedure: BIOPSY;  Surgeon: Danie Binder, MD;  Location: [facility];  Service: Endoscopy;;  . BIOPSY  04/21/2016   Procedure: BIOPSY;  Surgeon: Danie Binder, MD;  Location: [facility];  Service: Endoscopy;;  bx's of antrum, body of stomach, fundus, and cardia  . CHOLECYSTECTOMY    . COLONOSCOPY  2008 [facility]   LGIB 2o to TICS, prep good  . COLONOSCOPY WITH PROPOFOL N/A 08/14/2014   SLF: 1. Four large colon polyps removed. 2. The left colon is redundant 3. Moderate diverticulosis throughout teh entire examined colon  . COLONOSCOPY WITH PROPOFOL N/A 11/06/2014   SLF: 8 small polyps removed. One large pedunculated polyp removed from the ascending colon, tubulovillous and tubular adenomas. Next colonoscopy March 2019  . ESOPHAGOGASTRODUODENOSCOPY  12/08/2007   EQA:STMH gastric polyps seen in the cardia and body of the stomach/Normal esophagus without evidence of Barrett's, mass, erosion/Normal duodenal bulb and second portion of the duodenum. Benign bx.  . ESOPHAGOGASTRODUODENOSCOPY (EGD) WITH PROPOFOL N/A 08/14/2014   SLF: 1. Heme postive stools due to  gastric and colon polyps 2. Multiple gastric   . ESOPHAGOGASTRODUODENOSCOPY (EGD) WITH PROPOFOL N/A 03/12/2015   SLF: Multiple gastric nodules seen in gasric body/antrum. 2. Non-erosive gastritis ( inflammation) was found in the gastric antrum.   . ESOPHAGOGASTRODUODENOSCOPY (EGD) WITH PROPOFOL N/A 10/08/2015   Procedure: ESOPHAGOGASTRODUODENOSCOPY  (EGD) WITH PROPOFOL;  Surgeon: Danie Binder, MD;  [facility];  Service: Endoscopy;  Laterality: N/A;  0945  . ESOPHAGOGASTRODUODENOSCOPY (EGD) WITH PROPOFOL N/A 04/21/2016   Procedure: ESOPHAGOGASTRODUODENOSCOPY (EGD) WITH PROPOFOL;  Surgeon: Danie Binder, MD;  Location: [facility];  Service: Endoscopy;  Laterality: N/A;  730   . HAND SURGERY    . POLYPECTOMY  08/14/2014   Procedure: POLYPECTOMY;  Surgeon: Danie Binder, MD;  Location: [facility];  Service: Endoscopy;;  . POLYPECTOMY N/A 11/06/2014   Procedure: POLYPECTOMY;  Surgeon: Danie Binder, MD;  Location: [facility];  Service: Endoscopy;  Laterality: N/A;  Transverse Colon x3, Ascending Colon x2, Descending Colon x3  . REPLACEMENT TOTAL KNEE BILATERAL    . UPPER GASTROINTESTINAL ENDOSCOPY  APR 2009   INFLAMED HYPERPLASTIC POLYPS, CHRONIC GASTRITIS    Family History  Problem Relation Age of Onset  . Hypertension Sister   . Hypertension Brother   . Diabetes Brother   . Colon polyps Neg Hx   . Colon cancer Neg Hx   . Pancreatic disease Neg Hx     Social History   Social History  . Marital status: Single    Spouse name: N/A  . Number of children: 0  . Years of education: N/A   Occupational History  . retired    Social History Main Topics  . Smoking status: Never Smoker  . Smokeless tobacco: Never Used     Comment: Quit x 10 years/ never smoked on regular basis  . Alcohol use 1.2 oz/week    2 Shots of liquor per week     Comment: drinks on weekends, gin/vodka 1/5th.  . Drug use: No  . Sexual activity: No   Other Topics Concern  . None   Social History Narrative   HE DOES NOT HAVE ANY CHILDREN.     PHYSICAL EXAMINATION  ECOG PERFORMANCE STATUS: 1 - Symptomatic but completely ambulatory  Vitals:   07/08/16 1020  BP: 121/75  Pulse: 85  Resp: 16  Temp: 98.3 F (36.8 C)    GENERAL:alert, no distress, well nourished, well developed, comfortable, cooperative, obese, smiling and  unaccompanied SKIN: skin color, texture, turgor are normal, no rashes or significant lesions HEAD: Normocephalic, No masses, lesions, tenderness or abnormalities EYES: normal, EOMI, Conjunctiva are pink and non-injected EARS: External ears normal OROPHARYNX:lips, buccal mucosa, and tongue normal and mucous membranes are moist  NECK: supple, no adenopathy, trachea midline LYMPH:  no palpable lymphadenopathy BREAST:not examined LUNGS: clear to auscultation  HEART: regular rate & rhythm, no murmurs and no gallops ABDOMEN:abdomen soft, non-tender, obese and normal bowel sounds BACK: Back symmetric, no curvature. EXTREMITIES:less then 2 second capillary refill, no joint deformities, effusion, or inflammation, no skin discoloration, no cyanosis  NEURO: alert & oriented x 3 with fluent speech, no focal motor/sensory deficits, gait normal   LABORATORY DATA: CBC    Component Value Date/Time   WBC 6.6 07/08/2016 0947   RBC 4.86 07/08/2016 0947   HGB 14.8 07/08/2016 0947   HCT 44.3 07/08/2016 0947   HCT 44 01/05/2013   PLT 164 07/08/2016 0947   PLT 148 01/05/2013   MCV 91.2 07/08/2016 0947  MCV 90.4 01/05/2013   MCH 30.5 07/08/2016 0947   MCHC 33.4 07/08/2016 0947   RDW 13.7 07/08/2016 0947   LYMPHSABS 2.8 07/08/2016 0947   MONOABS 0.6 07/08/2016 0947   EOSABS 0.2 07/08/2016 0947   BASOSABS 0.0 07/08/2016 0947      Chemistry      Component Value Date/Time   NA 138 07/08/2016 0947   K 3.5 07/08/2016 0947   CL 103 07/08/2016 0947   CO2 27 07/08/2016 0947   BUN 15 07/08/2016 0947   CREATININE 1.42 (H) 07/08/2016 0947   CREATININE 1.30 02/24/2012 1023      Component Value Date/Time   CALCIUM 9.1 07/08/2016 0947   ALKPHOS 66 07/08/2016 0947   AST 49 (H) 07/08/2016 0947   ALT 26 07/08/2016 0947   BILITOT 1.0 07/08/2016 0947     Lab Results  Component Value Date   IRON 86 03/18/2016   TIBC 398 03/18/2016   FERRITIN 21 (L) 03/18/2016     PENDING LABS:   RADIOGRAPHIC  STUDIES:  No results found.   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  Octreotide scan on 09/19/2014 was negative for any avid metastasis.  His case was discussed at GI tumor board in Feb/March 2016 and recommendation was for anatomical biopsies.  This was completed by Dr. Oneida Alar on 04/21/2016 and biopsies of the antrum, body, fundus, and cardia were negative for malignancy (chronic active gastritis is noted).  Labs today: CBC diff, CMET, chromogranin A, serum serotonin, iron/TIBC, ferritin.  I personally reviewed and went over laboratory results with the patient.  The results are noted within this dictation.  He underwent repeat EGD by Dr. Oneida Alar on 04/21/2016 and biopsies of the antrum, body, fundus, and cardia were negative for malignancy (chronic active gastritis is noted).  She notes nodules that persist but she is unable to appreciate any additional lesions.  Labs in 12 weeks: CBC diff, CMET, serum serotonin, chromogranin A, iron/TIBC, ferritin.  He refuses influenza immunization today.  Return in 12 weeks for follow-up.  Iron deficiency anemia Iron deficiency without anemia, on PO iron replacement (ferrex forte)  beginning on 02/04/2016.  Tolerating well.  Labs today: CBC diff, iron/TIBC, ferritin  Labs in 12 weeks: CBC diff, iron/TIBC, ferritin.  I will refill his ferrex forte RX.   ORDERS PLACED FOR THIS ENCOUNTER: Orders Placed This Encounter  Procedures  . CBC with Differential  . Comprehensive metabolic panel  . Serotonin serum  . Chromogranin A  . Iron and TIBC  . Ferritin    MEDICATIONS PRESCRIBED THIS ENCOUNTER: Meds ordered this encounter  Medications  . Polysacchar Iron-FA-B12 (FERREX 150 FORTE) 150-1-25 MG-MG-MCG CAPS    Sig: Take 1 capsule by mouth daily.    Dispense:  30 capsule    Refill:  5     Please deliver to patient    Order Specific Question:   Supervising Provider    Answer:   Patrici Ranks [id number]    THERAPY PLAN:  Continue ferrex-forte and ongoing observation for neuroendocrine carcinoid.  All questions were answered. The patient knows to call the clinic with any problems, questions or concerns. We can certainly see the patient much sooner if necessary.  Patient and plan discussed with Dr. Ancil Linsey and she is in agreement with the aforementioned.   This note is electronically signed by: Doy Mince 07/08/2016  10:35 AM

## 2016-07-09 LAB — CHROMOGRANIN A: CHROMOGRANIN A: 2 nmol/L (ref 0–5)

## 2016-07-10 LAB — SEROTONIN SERUM: SEROTONIN, SERUM: 131 ng/mL (ref 21–321)

## 2016-07-21 ENCOUNTER — Other Ambulatory Visit (HOSPITAL_COMMUNITY): Payer: Self-pay | Admitting: Oncology

## 2016-07-21 DIAGNOSIS — D508 Other iron deficiency anemias: Secondary | ICD-10-CM

## 2016-07-29 ENCOUNTER — Other Ambulatory Visit: Payer: Self-pay | Admitting: Orthopedic Surgery

## 2016-07-29 ENCOUNTER — Telehealth: Payer: Self-pay | Admitting: Orthopedic Surgery

## 2016-07-29 MED ORDER — HYDROCODONE-ACETAMINOPHEN 5-325 MG PO TABS: 1.0000 | ORAL_TABLET | Freq: Four times a day (QID) | ORAL | 0 refills | Status: DC | PRN

## 2016-07-29 NOTE — Progress Notes (Signed)
REFILL 

## 2016-07-29 NOTE — Telephone Encounter (Signed)
Routing to Dr Harrison for approval 

## 2016-07-29 NOTE — Telephone Encounter (Signed)
Hydrocodone-Acetaminophen  5/325mg   Qty 56 Tablets  Take 1 tablet by mouth every 6 (six) hours as needed for moderate pain.

## 2016-08-26 ENCOUNTER — Encounter: Payer: Self-pay | Admitting: Gastroenterology

## 2016-09-02 ENCOUNTER — Telehealth: Payer: Self-pay | Admitting: Orthopedic Surgery

## 2016-09-02 ENCOUNTER — Other Ambulatory Visit: Payer: Self-pay | Admitting: Orthopedic Surgery

## 2016-09-02 MED ORDER — HYDROCODONE-ACETAMINOPHEN 10-325 MG PO TABS: 1.0000 | ORAL_TABLET | Freq: Three times a day (TID) | ORAL | 0 refills | Status: DC | PRN

## 2016-09-02 MED ORDER — HYDROCODONE-ACETAMINOPHEN 5-325 MG PO TABS: 1.0000 | ORAL_TABLET | Freq: Three times a day (TID) | ORAL | 0 refills | Status: DC | PRN

## 2016-09-02 NOTE — Telephone Encounter (Signed)
ROUTING TO DR HARRISON FOR APPROVAL 

## 2016-09-02 NOTE — Telephone Encounter (Signed)
Hydrocodone-Acetaminophen  5/325mg   Qty 56 Tablets  Take 1 tablet by mouth every 6 (six) hours as needed for moderate pain.

## 2016-09-17 ENCOUNTER — Ambulatory Visit: Payer: Medicare Other | Admitting: Family Medicine

## 2016-09-22 ENCOUNTER — Encounter: Payer: Self-pay | Admitting: Family Medicine

## 2016-09-22 ENCOUNTER — Ambulatory Visit (INDEPENDENT_AMBULATORY_CARE_PROVIDER_SITE_OTHER): Payer: Medicare Other | Admitting: Family Medicine

## 2016-09-22 ENCOUNTER — Telehealth: Payer: Self-pay | Admitting: Family Medicine

## 2016-09-22 VITALS — BP 134/86 | HR 82 | Temp 98.5°F | Resp 18 | Ht 72.5 in | Wt 256.0 lb

## 2016-09-22 DIAGNOSIS — IMO0002 Reserved for concepts with insufficient information to code with codable children: Secondary | ICD-10-CM

## 2016-09-22 DIAGNOSIS — E538 Deficiency of other specified B group vitamins: Secondary | ICD-10-CM

## 2016-09-22 DIAGNOSIS — Z7689 Persons encountering health services in other specified circumstances: Secondary | ICD-10-CM

## 2016-09-22 DIAGNOSIS — Z550 Illiteracy and low-level literacy: Secondary | ICD-10-CM | POA: Diagnosis not present

## 2016-09-22 DIAGNOSIS — Z8739 Personal history of other diseases of the musculoskeletal system and connective tissue: Secondary | ICD-10-CM | POA: Insufficient documentation

## 2016-09-22 DIAGNOSIS — I10 Essential (primary) hypertension: Secondary | ICD-10-CM | POA: Diagnosis not present

## 2016-09-22 DIAGNOSIS — Z96651 Presence of right artificial knee joint: Secondary | ICD-10-CM | POA: Insufficient documentation

## 2016-09-22 DIAGNOSIS — Z833 Family history of diabetes mellitus: Secondary | ICD-10-CM

## 2016-09-22 DIAGNOSIS — K219 Gastro-esophageal reflux disease without esophagitis: Secondary | ICD-10-CM | POA: Diagnosis not present

## 2016-09-22 DIAGNOSIS — Z96653 Presence of artificial knee joint, bilateral: Secondary | ICD-10-CM

## 2016-09-22 DIAGNOSIS — M1710 Unilateral primary osteoarthritis, unspecified knee: Secondary | ICD-10-CM

## 2016-09-22 MED ORDER — GABAPENTIN 300 MG PO CAPS: 300.0000 mg | ORAL_CAPSULE | Freq: Two times a day (BID) | ORAL | 3 refills | Status: DC

## 2016-09-22 MED ORDER — AMLODIPINE BESYLATE 10 MG PO TABS: 10.0000 mg | ORAL_TABLET | Freq: Every day | ORAL | 3 refills | Status: DC

## 2016-09-22 MED ORDER — METOPROLOL SUCCINATE ER 25 MG PO TB24: 25.0000 mg | ORAL_TABLET | Freq: Every day | ORAL | 3 refills | Status: DC

## 2016-09-22 NOTE — Progress Notes (Signed)
Chief Complaint  Patient presents with  . Establish Care   New patient Dropped off by aid, not accompanied Poor historian. Only completed 3rd grade, illiterate, "slow learner".  Suspect mild Mr. BP well controlled No complaint Refuses immunizations Chart reviewed and old records will be requested   Patient Active Problem List   Diagnosis Date Noted  . Personal history of gout 09/22/2016  . Literacy level of illiterate 09/22/2016  . History of bilateral knee replacement 09/22/2016  . Carcinoid tumor determined by biopsy of stomach 04/10/2016  . Tubulovillous adenoma of large intestine 09/12/2015  . Neuroendocrine tumor 09/25/2014  . Elevated LFTs 07/17/2014  . Thrombocytopenia, unspecified 03/22/2013  . Iron deficiency anemia 07/29/2010  . Fatty liver 07/29/2010  . ABUSE, ALCOHOL, UNSPECIFIED 11/30/2008  . Osteoarthrosis involving lower leg 08/20/2008  . Essential hypertension 10/20/2006  . B12 deficiency 08/15/2006  . GERD 08/15/2006  . IBS 08/15/2006  . HYPRTRPHY PROSTATE BNG W/O URINARY OBST/LUTS 08/15/2006  . OSTEOARTHRITIS 08/15/2006  . DEGENERATION, DISC NOS 08/15/2006    Outpatient Encounter Prescriptions as of 09/22/2016  Medication Sig  . acetaminophen (TYLENOL) 500 MG tablet Take 500 mg by mouth every 6 (six) hours as needed for moderate pain.  Marland Kitchen amLODipine (NORVASC) 10 MG tablet Take 1 tablet (10 mg total) by mouth daily.  . febuxostat (ULORIC) 40 MG tablet Take 40 mg by mouth daily.   Marland Kitchen FERREX 150 FORTE 150-1-25 MG-MG-MCG CAPS TAKE ONE CAPSULE BY MOUTH DAILY.  Marland Kitchen gabapentin (NEURONTIN) 300 MG capsule Take 1 capsule (300 mg total) by mouth 2 (two) times daily.  Marland Kitchen levothyroxine (SYNTHROID, LEVOTHROID) 175 MCG tablet Take 175 mcg by mouth daily before breakfast.   . metoprolol succinate (TOPROL-XL) 25 MG 24 hr tablet Take 1 tablet (25 mg total) by mouth daily.  Marland Kitchen omeprazole (PRILOSEC) 20 MG capsule Take 20 mg by mouth daily.   . tamsulosin (FLOMAX) 0.4 MG CAPS  capsule Take 0.4 mg by mouth daily.   No facility-administered encounter medications on file as of 09/22/2016.     Past Medical History:  Diagnosis Date  . Anemia FeDA: GASTRIC POLYPS, B12   TCS 2008 EGD 2009, 2008-HB 11.1 MCV 83.6 CR 1.22, 2009 FERRITIN 102-22  . B12 deficiency   . Carcinoid tumor determined by biopsy of stomach   . Chronic knee pain   . Diverticulosis 2008 LGIB  . DIVERTICULOSIS, COLON 08/15/2006   Qualifier: Diagnosis of  By: Jonna Munro MD, Roderic Scarce    . GERD (gastroesophageal reflux disease)   . Gout   . Hepatomegaly 2o to FATTY LIVER DZ  . History of alcohol abuse   . History of septic arthritis   . HTN (hypertension)   . Hypothyroidism   . Neuroendocrine tumor 09/25/2014  . PUD 11/30/2008   Qualifier: History of  By: Jonna Munro MD, Roderic Scarce    . Substance abuse     Past Surgical History:  Procedure Laterality Date  . BIOPSY  08/14/2014   Procedure: BIOPSY;  Surgeon: Danie Binder, MD;  Location: [facility];  Service: Endoscopy;;  . BIOPSY  03/12/2015   Procedure: BIOPSY;  Surgeon: Danie Binder, MD;  Location: [facility];  Service: Endoscopy;;  . BIOPSY  04/21/2016   Procedure: BIOPSY;  Surgeon: Danie Binder, MD;  [facility];  Service: Endoscopy;;  bx's of antrum, body of stomach, fundus, and cardia  . CHOLECYSTECTOMY    . COLONOSCOPY  2008 [facility] DJ   LGIB 2o to TICS, prep good  .  COLONOSCOPY WITH PROPOFOL N/A 08/14/2014   SLF: 1. Four large colon polyps removed. 2. The left colon is redundant 3. Moderate diverticulosis throughout teh entire examined colon  . COLONOSCOPY WITH PROPOFOL N/A 11/06/2014   SLF: 8 small polyps removed. One large pedunculated polyp removed from the ascending colon, tubulovillous and tubular adenomas. Next colonoscopy March 2019  . ESOPHAGOGASTRODUODENOSCOPY  12/08/2007   INO:MVEH gastric polyps seen in the cardia and body of the stomach/Normal esophagus without evidence of Barrett's, mass, erosion/Normal duodenal bulb and  second portion of the duodenum. Benign bx.  . ESOPHAGOGASTRODUODENOSCOPY (EGD) WITH PROPOFOL N/A 08/14/2014   SLF: 1. Heme postive stools due to gastric and colon polyps 2. Multiple gastric   . ESOPHAGOGASTRODUODENOSCOPY (EGD) WITH PROPOFOL N/A 03/12/2015   SLF: Multiple gastric nodules seen in gasric body/antrum. 2. Non-erosive gastritis ( inflammation) was found in the gastric antrum.   . ESOPHAGOGASTRODUODENOSCOPY (EGD) WITH PROPOFOL N/A 10/08/2015   Procedure: ESOPHAGOGASTRODUODENOSCOPY (EGD) WITH PROPOFOL;  Surgeon: Danie Binder, MD;  [facility];  Service: Endoscopy;  Laterality: N/A;  0945  . ESOPHAGOGASTRODUODENOSCOPY (EGD) WITH PROPOFOL N/A 04/21/2016   Procedure: ESOPHAGOGASTRODUODENOSCOPY (EGD) WITH PROPOFOL;  Surgeon: Danie Binder, MD;  [facility];  Service: Endoscopy;  Laterality: N/A;  730   . HAND SURGERY    . JOINT REPLACEMENT    . POLYPECTOMY  08/14/2014   Procedure: POLYPECTOMY;  Surgeon: Danie Binder, MD;  Location: [facility];  Service: Endoscopy;;  . POLYPECTOMY N/A 11/06/2014   Procedure: POLYPECTOMY;  Surgeon: Danie Binder, MD;  Location: [facility];  Service: Endoscopy;  Laterality: N/A;  Transverse Colon x3, Ascending Colon x2, Descending Colon x3  . REPLACEMENT TOTAL KNEE BILATERAL    . UPPER GASTROINTESTINAL ENDOSCOPY  APR 2009   INFLAMED HYPERPLASTIC POLYPS, CHRONIC GASTRITIS    Social History   Social History  . Marital status: Single    Spouse name: N/A  . Number of children: 0  . Years of education: 3   Occupational History  . Retired- disabled     farming   Social History Main Topics  . Smoking status: Never Smoker  . Smokeless tobacco: Never Used     Comment: Quit x 10 years/ never smoked on regular basis  . Alcohol use 1.2 oz/week    2 Shots of liquor per week     Comment: drinks on weekends, gin/vodka 1/5th.  . Drug use: No  . Sexual activity: No   Other Topics Concern  . Not on file   Social History Narrative   HE  DOES NOT HAVE ANY CHILDREN.   Lives alone   Does not drive   CAN NOT READ    Family History  Problem Relation Age of Onset  . Diabetes Mother   . Diabetes Father   . Hypertension Sister   . Hypertension Brother   . Diabetes Brother   . Diabetes Sister   . Diabetes Sister   . Diabetes Brother   . Colon polyps Neg Hx   . Colon cancer Neg Hx   . Pancreatic disease Neg Hx     Review of Systems  Constitutional: Negative for chills, fever and weight loss.  HENT: Negative for congestion and hearing loss.   Eyes: Negative for blurred vision and pain.  Respiratory: Negative for cough and shortness of breath.   Cardiovascular: Negative for chest pain, palpitations and leg swelling.  Gastrointestinal: Negative for abdominal pain, constipation, diarrhea and heartburn.  Genitourinary: Negative for  dysuria and frequency.  Musculoskeletal: Positive for joint pain. Negative for falls and myalgias.       Knees replaced, unstable gait, gout  Neurological: Negative for dizziness, seizures and headaches.  Psychiatric/Behavioral: Negative for depression. The patient is not nervous/anxious and does not have insomnia.     BP 134/86 (BP Location: Right Arm, Patient Position: Sitting, Cuff Size: Large)   Pulse 82   Temp 98.5 F (36.9 C) (Oral)   Resp 18   Ht 6' 0.5" (1.842 m)   Wt 256 lb (116.1 kg)   SpO2 97%   BMI 34.24 kg/m   Physical Exam  Constitutional: He appears well-developed and well-nourished.  Pleasant.  Well groomed.  Slow responses.  Uses cane  HENT:  Head: Normocephalic and atraumatic.  Right Ear: External ear normal.  Left Ear: External ear normal.  Mouth/Throat: Oropharynx is clear and moist.  Eyes: Conjunctivae are normal. Pupils are equal, round, and reactive to light.  Neck: Normal range of motion. Neck supple. No thyromegaly present.  Cardiovascular: Normal rate, regular rhythm and normal heart sounds.   Pulmonary/Chest: Effort normal and breath sounds normal. No  respiratory distress.  Abdominal: Soft. Bowel sounds are normal. There is no tenderness.  Musculoskeletal: Normal range of motion. He exhibits no edema.  Deformities R wrist old injury. Both knees replaced  Lymphadenopathy:    He has no cervical adenopathy.  Neurological: He is alert.  Gait antalgic  Skin: Skin is warm and dry.  Psychiatric: He has a normal mood and affect. His behavior is normal.  Nursing note and vitals reviewed.  ASSESSMENT/PLAN:  1. Personal history of gout controlled  2. Literacy level of illiterate   3. Essential hypertension controlled - CBC - Comprehensive metabolic panel - Lipid panel - TSH - Urinalysis, Routine w reflex microscopic  4. Gastroesophageal reflux disease, esophagitis presence not specified Asymptomatic on PPI  5. Osteoarthrosis involving lower leg  - VITAMIN D 25 Hydroxy (Vit-D Deficiency, Fractures)  6. History of bilateral knee replacement   7. Vitamin B 12 deficiency - Vitamin B12  8. Family history of diabetes mellitus - Hemoglobin A1c   Patient Instructions  Need old records [place] [facility]  Continue same medicine Need blood work, this can be done Feb 9th with your already scheduled blood tests See me in one month   Raylene Everts, MD

## 2016-09-22 NOTE — Telephone Encounter (Signed)
[name] needs refills on medications metoprolol succinate (TOPROL-XL) 25 MG 24 hr tablet andmetoprolol succinate (TOPROL-XL) 25 MG 24 hr tablet please have them delivered

## 2016-09-22 NOTE — Patient Instructions (Addendum)
Need old records [facility]  Continue same medicine Need blood work, this can be done Feb 9th with your already scheduled blood tests See me in one month

## 2016-09-22 NOTE — Telephone Encounter (Signed)
Med refilled at visit

## 2016-09-28 ENCOUNTER — Telehealth: Payer: Self-pay | Admitting: Orthopedic Surgery

## 2016-09-28 ENCOUNTER — Other Ambulatory Visit: Payer: Self-pay | Admitting: Orthopedic Surgery

## 2016-09-28 MED ORDER — HYDROCODONE-ACETAMINOPHEN 5-325 MG PO TABS: 1.0000 | ORAL_TABLET | Freq: Four times a day (QID) | ORAL | 0 refills | Status: DC | PRN

## 2016-09-28 NOTE — Progress Notes (Signed)
[place] controlled substance reporting system reviewed  

## 2016-09-28 NOTE — Telephone Encounter (Signed)
ROUTING TO DR HARRISON FOR REVIEW 

## 2016-09-28 NOTE — Telephone Encounter (Signed)
Patient called for refill of Hydrocodone/Acetominophen 5/325, quantity 56; please advise.

## 2016-09-29 ENCOUNTER — Encounter: Payer: Self-pay | Admitting: Family Medicine

## 2016-09-29 DIAGNOSIS — F70 Mild intellectual disabilities: Secondary | ICD-10-CM | POA: Insufficient documentation

## 2016-09-30 ENCOUNTER — Ambulatory Visit (INDEPENDENT_AMBULATORY_CARE_PROVIDER_SITE_OTHER): Payer: Medicare Other | Admitting: Gastroenterology

## 2016-09-30 ENCOUNTER — Encounter: Payer: Self-pay | Admitting: Gastroenterology

## 2016-09-30 DIAGNOSIS — K219 Gastro-esophageal reflux disease without esophagitis: Secondary | ICD-10-CM | POA: Diagnosis not present

## 2016-09-30 DIAGNOSIS — D126 Benign neoplasm of colon, unspecified: Secondary | ICD-10-CM

## 2016-09-30 DIAGNOSIS — D3A8 Other benign neuroendocrine tumors: Secondary | ICD-10-CM

## 2016-09-30 DIAGNOSIS — D3A092 Benign carcinoid tumor of the stomach: Secondary | ICD-10-CM

## 2016-09-30 DIAGNOSIS — D500 Iron deficiency anemia secondary to blood loss (chronic): Secondary | ICD-10-CM

## 2016-09-30 MED ORDER — OMEPRAZOLE 20 MG PO CPDR: 20.0000 mg | DELAYED_RELEASE_CAPSULE | Freq: Every day | ORAL | 11 refills | Status: DC

## 2016-09-30 NOTE — Assessment & Plan Note (Signed)
NO WARNING SIGNS/SYMPTOMS-LAST EGD 2016 NO CARCINOID.  CONTINUE TO MONITOR SYMPTOMS.

## 2016-09-30 NOTE — Patient Instructions (Addendum)
PLEASE CALL WITH QUESTIONS OR CONCERNS.  CONTINUE IRON PILLS.  I WILL REVIEW LABS IN THE COMPUTER AFTER THEY ARE DRAWN.  TAKE OMEPRAZOLE ONCE DAILY WITH BREAKFAST FOREVER TO PROTECT YOUR STOMACH.  FOLLOW UP IN 1 YEAR.  NEXT COLONOSCOPY IN [facility] 2019.

## 2016-09-30 NOTE — Progress Notes (Signed)
cc'ed to pcp °

## 2016-09-30 NOTE — Progress Notes (Signed)
Subjective:    Patient ID: [name], male    DOB: 09-14-1950, 66 y.o.   MRN: [id number] Raylene Everts, MD  HPI Bowels move every day. No questions or concerns. Has a cbc next fri at hospital. [place]. NO WEIGHT LOSS. ENERGY LEVEL: GOOD.  PT DENIES FEVER, CHILLS, HEMATOCHEZIA, HEMATEMESIS, nausea, vomiting, melena, diarrhea, CHEST PAIN, SHORTNESS OF BREATH, CHANGE IN BOWEL IN HABITS, constipation, abdominal pain, problems swallowing, or heartburn or indigestion.  Past Medical History:  Diagnosis Date  . Anemia FeDA: GASTRIC POLYPS, B12   TCS 2008 EGD 2009, 2008-HB 11.1 MCV 83.6 CR 1.22, 2009 FERRITIN 102-22  . B12 deficiency   . Carcinoid tumor determined by biopsy of stomach   . Chronic knee pain   . Diverticulosis 2008 LGIB  . DIVERTICULOSIS, COLON 08/15/2006   Qualifier: Diagnosis of  By: Jonna Munro MD, Roderic Scarce    . GERD (gastroesophageal reflux disease)   . Gout   . Hepatomegaly 2o to FATTY LIVER DZ  . History of alcohol abuse   . History of septic arthritis   . HTN (hypertension)   . Hypothyroidism   . Neuroendocrine tumor 09/25/2014  . PUD 11/30/2008   Qualifier: History of  By: Jonna Munro MD, Roderic Scarce    . Substance abuse     Past Surgical History:  Procedure Laterality Date  . BIOPSY  08/14/2014   Procedure: BIOPSY;  Surgeon: Danie Binder, MD;  Location: [facility];  Service: Endoscopy;;  . BIOPSY  03/12/2015   Procedure: BIOPSY;  Surgeon: Danie Binder, MD;  [facility]: [facility];  Service: Endoscopy;;  . BIOPSY  04/21/2016   Procedure: BIOPSY;  Surgeon: Danie Binder, MD;  [facility];  Service: Endoscopy;;  bx's of antrum, body of stomach, fundus, and cardia  . CHOLECYSTECTOMY    . COLONOSCOPY  2008 [facility] DJ   LGIB 2o to TICS, prep good  . COLONOSCOPY WITH PROPOFOL N/A 08/14/2014   SLF: 1. Four large colon polyps removed. 2. The left colon is redundant 3. Moderate diverticulosis throughout teh entire examined colon  . COLONOSCOPY WITH PROPOFOL N/A  11/06/2014   SLF: 8 small polyps removed. One large pedunculated polyp removed from the ascending colon, tubulovillous and tubular adenomas. Next colonoscopy March 2019  . ESOPHAGOGASTRODUODENOSCOPY  12/08/2007   HAL:PFXT gastric polyps seen in the cardia and body of the stomach/Normal esophagus without evidence of Barrett's, mass, erosion/Normal duodenal bulb and second portion of the duodenum. Benign bx.  . ESOPHAGOGASTRODUODENOSCOPY (EGD) WITH PROPOFOL N/A 08/14/2014   SLF: 1. Heme postive stools due to gastric and colon polyps 2. Multiple gastric   . ESOPHAGOGASTRODUODENOSCOPY (EGD) WITH PROPOFOL N/A 03/12/2015   SLF: Multiple gastric nodules seen in gasric body/antrum. 2. Non-erosive gastritis ( inflammation) was found in the gastric antrum.   . ESOPHAGOGASTRODUODENOSCOPY (EGD) WITH PROPOFOL N/A 10/08/2015   Procedure: ESOPHAGOGASTRODUODENOSCOPY (EGD) WITH PROPOFOL;  Surgeon: Danie Binder, MD;  [facility];  Service: Endoscopy;  Laterality: N/A;  0945  . ESOPHAGOGASTRODUODENOSCOPY (EGD) WITH PROPOFOL N/A 04/21/2016   Procedure: ESOPHAGOGASTRODUODENOSCOPY (EGD) WITH PROPOFOL;  Surgeon: Danie Binder, MD;  [facility];  Service: Endoscopy;  Laterality: N/A;  730   . HAND SURGERY    . JOINT REPLACEMENT    . POLYPECTOMY  08/14/2014   Procedure: POLYPECTOMY;  Surgeon: Danie Binder, MD;  Location: [facility];  Service: Endoscopy;;  . POLYPECTOMY N/A 11/06/2014   Procedure: POLYPECTOMY;  Surgeon: Danie Binder, MD;  Location[facility];  Service: Endoscopy;  Laterality: N/A;  Transverse Colon x3, Ascending Colon x2, Descending Colon x3  . REPLACEMENT TOTAL KNEE BILATERAL    . UPPER GASTROINTESTINAL ENDOSCOPY  APR 2009   INFLAMED HYPERPLASTIC POLYPS, CHRONIC GASTRITIS   Allergies  Allergen Reactions  . Aspirin     REACTION: Diverticular Bleed    Current Outpatient Prescriptions  Medication Sig Dispense Refill  . acetaminophen (TYLENOL) 500 MG tablet Take 500 mg by mouth  every 6 (six) hours as needed for moderate pain.    Marland Kitchen amLODipine (NORVASC) 10 MG tablet Take 1 tablet (10 mg total) by mouth daily.    . febuxostat (ULORIC) 40 MG tablet Take 40 mg by mouth daily.     Marland Kitchen FERREX 150 FORTE 150-1-25 MG-MG-MCG CAPS TAKE ONE CAPSULE BY MOUTH DAILY.    Marland Kitchen gabapentin (NEURONTIN) 300 MG capsule Take 1 capsule (300 mg total) by mouth 2 (two) times daily.    Marland Kitchen HYDROcodone-acetaminophen (NORCO/VICODIN) 5-325 MG tablet Take 1 tablet by mouth every 6 (six) hours as needed for moderate pain.    Marland Kitchen levothyroxine (SYNTHROID, LEVOTHROID) 175 MCG tablet Take 175 mcg by mouth daily before breakfast.     . metoprolol succinate (TOPROL-XL) 25 MG 24 hr tablet Take 1 tablet (25 mg total) by mouth daily.    Marland Kitchen omeprazole (PRILOSEC) 20 MG capsule Take 20 mg by mouth daily.     . tamsulosin (FLOMAX) 0.4 MG CAPS capsule Take 0.4 mg by mouth daily.     Review of Systems PER HPI OTHERWISE ALL SYSTEMS ARE NEGATIVE.     Objective:   Physical Exam  Constitutional: He is oriented to person, place, and time. He appears well-developed and well-nourished. No distress.  HENT:  Head: Normocephalic and atraumatic.  Mouth/Throat: Oropharynx is clear and moist. No oropharyngeal exudate.  Eyes: Pupils are equal, round, and reactive to light. No scleral icterus.  Neck: Normal range of motion. Neck supple.  Cardiovascular: Normal rate, regular rhythm and normal heart sounds.   Pulmonary/Chest: Effort normal and breath sounds normal. No respiratory distress.  Abdominal: Soft. Bowel sounds are normal. He exhibits no distension. There is no tenderness.  Musculoskeletal: He exhibits no edema.  Lymphadenopathy:    He has no cervical adenopathy.  Neurological: He is alert and oriented to person, place, and time.  NO  NEW FOCAL DEFICITS  Psychiatric: He has a normal mood and affect.  Vitals reviewed.     Assessment & Plan:

## 2016-09-30 NOTE — Assessment & Plan Note (Signed)
IN ASCENDING COLON-> 1 CM W/ HGD. NO WARNING SIGNS/SYMPTOMS. REVIEWED TCS REPORT FROM 2016.  NEXT  TCS IN 2019.

## 2016-09-30 NOTE — Assessment & Plan Note (Signed)
NOV 2017 NL Hb,FERRITIN 43. NO BRBPR OR MELENA.  CBC NEXT WEEK. WILL AWAIT RESULTS.

## 2016-09-30 NOTE — Assessment & Plan Note (Signed)
SYMPTOMS FAIRLY WELL CONTROLLED.  CONTINUE TO MONITOR SYMPTOMS. CONTINUE OMEPRAZOLE.  TAKE 30 MINUTES PRIOR TO YOUR FIRST MEAL. FOLLOW UP IN 1 YEAR.

## 2016-10-01 NOTE — Progress Notes (Signed)
ON RECALL  °

## 2016-10-09 ENCOUNTER — Other Ambulatory Visit (HOSPITAL_COMMUNITY): Payer: Medicare Other

## 2016-10-09 ENCOUNTER — Ambulatory Visit (HOSPITAL_COMMUNITY): Payer: Medicare Other | Admitting: Oncology

## 2016-10-13 ENCOUNTER — Telehealth: Payer: Self-pay

## 2016-10-13 ENCOUNTER — Other Ambulatory Visit: Payer: Self-pay

## 2016-10-13 MED ORDER — LEVOTHYROXINE SODIUM 175 MCG PO TABS: 175.0000 ug | ORAL_TABLET | Freq: Every day | ORAL | 3 refills | Status: DC

## 2016-10-13 MED ORDER — TAMSULOSIN HCL 0.4 MG PO CAPS: 0.4000 mg | ORAL_CAPSULE | Freq: Every day | ORAL | 3 refills | Status: DC

## 2016-10-13 MED ORDER — FEBUXOSTAT 40 MG PO TABS: 40.0000 mg | ORAL_TABLET | Freq: Every day | ORAL | 3 refills | Status: DC

## 2016-10-13 NOTE — Telephone Encounter (Signed)
[name], please sed Rx requests to [facility]

## 2016-10-13 NOTE — Telephone Encounter (Signed)
Noted  

## 2016-10-13 NOTE — Telephone Encounter (Signed)
Seen 1 23 18

## 2016-10-16 DIAGNOSIS — M1710 Unilateral primary osteoarthritis, unspecified knee: Secondary | ICD-10-CM | POA: Diagnosis not present

## 2016-10-16 DIAGNOSIS — E538 Deficiency of other specified B group vitamins: Secondary | ICD-10-CM | POA: Diagnosis not present

## 2016-10-16 DIAGNOSIS — I10 Essential (primary) hypertension: Secondary | ICD-10-CM | POA: Diagnosis not present

## 2016-10-16 DIAGNOSIS — E559 Vitamin D deficiency, unspecified: Secondary | ICD-10-CM | POA: Diagnosis not present

## 2016-10-16 DIAGNOSIS — Z833 Family history of diabetes mellitus: Secondary | ICD-10-CM | POA: Diagnosis not present

## 2016-10-16 LAB — CBC
HCT: 43.7 % (ref 38.5–50.0)
Hemoglobin: 14.5 g/dL (ref 13.2–17.1)
MCH: 29.9 pg (ref 27.0–33.0)
MCHC: 33.2 g/dL (ref 32.0–36.0)
MCV: 90.1 fL (ref 80.0–100.0)
MPV: 10.3 fL (ref 7.5–12.5)
PLATELETS: 142 10*3/uL (ref 140–400)
RBC: 4.85 MIL/uL (ref 4.20–5.80)
RDW: 14.9 % (ref 11.0–15.0)
WBC: 5.8 10*3/uL (ref 3.8–10.8)

## 2016-10-17 LAB — LIPID PANEL
Cholesterol: 160 mg/dL (ref <200)
HDL: 31 mg/dL — AB (ref >40)
LDL CALC: 99 mg/dL (ref <100)
TRIGLYCERIDES: 150 mg/dL — AB (ref <150)
Total CHOL/HDL Ratio: 5.2 Ratio — ABNORMAL HIGH (ref <5.0)
VLDL: 30 mg/dL (ref <30)

## 2016-10-17 LAB — URINALYSIS, MICROSCOPIC ONLY
CASTS: NONE SEEN [LPF]
CRYSTALS: NONE SEEN [HPF]
Yeast: NONE SEEN [HPF]

## 2016-10-17 LAB — URINALYSIS, ROUTINE W REFLEX MICROSCOPIC
Bilirubin Urine: NEGATIVE
GLUCOSE, UA: NEGATIVE
HGB URINE DIPSTICK: NEGATIVE
Nitrite: NEGATIVE
PH: 6 (ref 5.0–8.0)
SPECIFIC GRAVITY, URINE: 1.022 (ref 1.001–1.035)

## 2016-10-17 LAB — COMPREHENSIVE METABOLIC PANEL
ALT: 16 U/L (ref 9–46)
AST: 29 U/L (ref 10–35)
Albumin: 3.8 g/dL (ref 3.6–5.1)
Alkaline Phosphatase: 57 U/L (ref 40–115)
BUN: 14 mg/dL (ref 7–25)
CHLORIDE: 102 mmol/L (ref 98–110)
CO2: 25 mmol/L (ref 20–31)
Calcium: 9 mg/dL (ref 8.6–10.3)
Creat: 1.27 mg/dL — ABNORMAL HIGH (ref 0.70–1.25)
Glucose, Bld: 105 mg/dL — ABNORMAL HIGH (ref 65–99)
POTASSIUM: 3.9 mmol/L (ref 3.5–5.3)
Sodium: 138 mmol/L (ref 135–146)
TOTAL PROTEIN: 7.5 g/dL (ref 6.1–8.1)
Total Bilirubin: 0.6 mg/dL (ref 0.2–1.2)

## 2016-10-17 LAB — TSH: TSH: 11.17 mIU/L — ABNORMAL HIGH (ref 0.40–4.50)

## 2016-10-17 LAB — VITAMIN D 25 HYDROXY (VIT D DEFICIENCY, FRACTURES): Vit D, 25-Hydroxy: 17 ng/mL — ABNORMAL LOW (ref 30–100)

## 2016-10-17 LAB — HEMOGLOBIN A1C
HEMOGLOBIN A1C: 5.6 % (ref <5.7)
Mean Plasma Glucose: 114 mg/dL

## 2016-10-17 LAB — VITAMIN B12: Vitamin B-12: 850 pg/mL (ref 200–1100)

## 2016-10-19 ENCOUNTER — Other Ambulatory Visit: Payer: Self-pay

## 2016-10-19 ENCOUNTER — Encounter: Payer: Self-pay | Admitting: Family Medicine

## 2016-10-19 MED ORDER — LEVOTHYROXINE SODIUM 200 MCG PO TABS: 200.0000 ug | ORAL_TABLET | Freq: Every day | ORAL | 0 refills | Status: DC

## 2016-10-23 ENCOUNTER — Ambulatory Visit (INDEPENDENT_AMBULATORY_CARE_PROVIDER_SITE_OTHER): Payer: Medicare Other | Admitting: Family Medicine

## 2016-10-23 ENCOUNTER — Encounter: Payer: Self-pay | Admitting: Family Medicine

## 2016-10-23 VITALS — BP 112/78 | HR 72 | Temp 98.0°F | Resp 18 | Ht 73.0 in | Wt 256.0 lb

## 2016-10-23 DIAGNOSIS — E785 Hyperlipidemia, unspecified: Secondary | ICD-10-CM | POA: Insufficient documentation

## 2016-10-23 DIAGNOSIS — M1710 Unilateral primary osteoarthritis, unspecified knee: Secondary | ICD-10-CM | POA: Diagnosis not present

## 2016-10-23 DIAGNOSIS — E784 Other hyperlipidemia: Secondary | ICD-10-CM | POA: Diagnosis not present

## 2016-10-23 DIAGNOSIS — I10 Essential (primary) hypertension: Secondary | ICD-10-CM | POA: Diagnosis not present

## 2016-10-23 DIAGNOSIS — E7849 Other hyperlipidemia: Secondary | ICD-10-CM

## 2016-10-23 DIAGNOSIS — K219 Gastro-esophageal reflux disease without esophagitis: Secondary | ICD-10-CM | POA: Diagnosis not present

## 2016-10-23 DIAGNOSIS — IMO0002 Reserved for concepts with insufficient information to code with codable children: Secondary | ICD-10-CM

## 2016-10-23 MED ORDER — IBUPROFEN 800 MG PO TABS: 800.0000 mg | ORAL_TABLET | Freq: Three times a day (TID) | ORAL | 0 refills | Status: DC | PRN

## 2016-10-23 MED ORDER — ATORVASTATIN CALCIUM 20 MG PO TABS: 20.0000 mg | ORAL_TABLET | Freq: Every day | ORAL | 3 refills | Status: DC

## 2016-10-23 NOTE — Progress Notes (Signed)
Chief Complaint  Patient presents with  . Follow-up    1 month   Patient is here for follow-up. We discussed his recent lab work. He has hyperlipidemia. I'm going to start him on a statin. His blood pressure is well controlled. He has bilateral knee pain from knee replacements. He uses a cane. He states that he takes hydrocodone for pain. This is given to him by his orthopedic. His mean is not strong enough. I tell him I don't want him taking narcotics on a daily basis. He agrees to try to take acetaminophen for knee pain, and I'm giving him ibuprofen to take to try for the pain. He needs to take no more than one a day. Take it with food. I explained to him that this can upset his stomach. He is to continue taking his PPI. He continues to drink alcohol the weekend. He has a history of alcohol abuse. I'm encouraging him to quit drinking. On his recent lab work he was found to be hypothyroid. I have increased his Synthroid. We'll check this again in a couple months. He denies any weight gain constipation or fatigue.  Patient Active Problem List   Diagnosis Date Noted  . Mild intellectual disabilities (CODE) 09/29/2016  . Personal history of gout 09/22/2016  . Literacy level of illiterate 09/22/2016  . History of bilateral knee replacement 09/22/2016  . Carcinoid tumor determined by biopsy of stomach 04/10/2016  . Tubulovillous adenoma of large intestine 09/12/2015  . Neuroendocrine tumor 09/25/2014  . Elevated LFTs 07/17/2014  . Iron deficiency anemia 07/29/2010  . Fatty liver 07/29/2010  . ABUSE, ALCOHOL, UNSPECIFIED 11/30/2008  . Osteoarthrosis involving lower leg 08/20/2008  . Essential hypertension 10/20/2006  . B12 deficiency 08/15/2006  . GERD 08/15/2006  . IBS 08/15/2006  . HYPRTRPHY PROSTATE BNG W/O URINARY OBST/LUTS 08/15/2006  . OSTEOARTHRITIS 08/15/2006  . DEGENERATION, DISC NOS 08/15/2006    Outpatient Encounter Prescriptions as of 10/23/2016  Medication Sig  .  amLODipine (NORVASC) 10 MG tablet Take 1 tablet (10 mg total) by mouth daily.  . febuxostat (ULORIC) 40 MG tablet Take 1 tablet (40 mg total) by mouth daily.  Marland Kitchen FERREX 150 FORTE 150-1-25 MG-MG-MCG CAPS TAKE ONE CAPSULE BY MOUTH DAILY.  Marland Kitchen levothyroxine (SYNTHROID) 200 MCG tablet Take 1 tablet (200 mcg total) by mouth daily before breakfast.  . metoprolol succinate (TOPROL-XL) 25 MG 24 hr tablet Take 1 tablet (25 mg total) by mouth daily.  Marland Kitchen omeprazole (PRILOSEC) 20 MG capsule Take 1 capsule (20 mg total) by mouth daily.  . tamsulosin (FLOMAX) 0.4 MG CAPS capsule Take 1 capsule (0.4 mg total) by mouth daily.  . [DISCONTINUED] HYDROcodone-acetaminophen (NORCO/VICODIN) 5-325 MG tablet Take 1 tablet by mouth every 6 (six) hours as needed for moderate pain.  Marland Kitchen acetaminophen (TYLENOL) 500 MG tablet Take 500 mg by mouth every 6 (six) hours as needed for moderate pain.  Marland Kitchen atorvastatin (LIPITOR) 20 MG tablet Take 1 tablet (20 mg total) by mouth daily.  Marland Kitchen ibuprofen (ADVIL,MOTRIN) 800 MG tablet Take 1 tablet (800 mg total) by mouth every 8 (eight) hours as needed. Take with food   No facility-administered encounter medications on file as of 10/23/2016.     Allergies  Allergen Reactions  . Aspirin     REACTION: Diverticular Bleed    Review of Systems  Constitutional: Negative for activity change, appetite change, fatigue and unexpected weight change.  HENT: Negative for dental problem and hearing loss.   Eyes: Negative.  Negative for visual disturbance.  Respiratory: Negative for cough and shortness of breath.   Cardiovascular: Negative for chest pain and palpitations.  Gastrointestinal: Negative for constipation and diarrhea.  Genitourinary: Negative for difficulty urinating and frequency.  Musculoskeletal: Positive for arthralgias, gait problem and joint swelling.  Skin: Negative.   Neurological: Negative for dizziness and headaches.  Psychiatric/Behavioral: Negative for dysphoric mood and sleep  disturbance.    BP 112/78 (BP Location: Right Arm, Patient Position: Sitting, Cuff Size: Normal)   Pulse 72   Temp 98 F (36.7 C) (Temporal)   Resp 18   Ht 6\' 1"  (1.854 m)   Wt 256 lb 0.6 oz (116.1 kg)   SpO2 97%   BMI 33.78 kg/m   Physical Exam  Constitutional: He appears well-developed and well-nourished.  Pleasant.  Well groomed.  Slow responses.  Uses cane  HENT:  Head: Normocephalic and atraumatic.  Right Ear: External ear normal.  Left Ear: External ear normal.  Mouth/Throat: Oropharynx is clear and moist.  Eyes: Conjunctivae are normal. Pupils are equal, round, and reactive to light.  Neck: Normal range of motion. Neck supple. No thyromegaly present.  Cardiovascular: Normal rate, regular rhythm and normal heart sounds.   Pulmonary/Chest: Effort normal and breath sounds normal. No respiratory distress.  Abdominal: Soft. Bowel sounds are normal. There is no tenderness.  Musculoskeletal: Normal range of motion. He exhibits no edema.  Deformities R wrist old injury. Both knees replaced. Mild crepitus. No warmth or instability.  Lymphadenopathy:    He has no cervical adenopathy.  Neurological: He is alert.  Gait antalgic  Skin: Skin is warm and dry.  Psychiatric: He has a normal mood and affect. His behavior is normal.  Nursing note and vitals reviewed.   ASSESSMENT/PLAN:  1. Essential hypertension  - CBC - Comprehensive metabolic panel - Lipid panel - TSH - Urinalysis, Routine w reflex microscopic - VITAMIN D 25 Hydroxy (Vit-D Deficiency, Fractures)  2. Gastroesophageal reflux disease, esophagitis presence not specified  3. Osteoarthrosis involving lower leg  4. Hyperlipidemia  Patient Instructions  Try to stop alcohol Drink no more than 2 drinks a day  Stop the hydrocodone ( narcotic) Take ibuprofen as needed for knee pain Take this with food Let me know if it causes any stomach upset  Take the atorvastatin once a day This is to reduce  cholesterol This will reduce risk of stroke and  Heart attack  Need to see me in 3 months Need blood work with next visit      Raylene Everts, MD

## 2016-10-23 NOTE — Patient Instructions (Addendum)
Try to stop alcohol Drink no more than 2 drinks a day  Stop the hydrocodone ( narcotic) Take ibuprofen as needed for knee pain Take this with food Let me know if it causes any stomach upset  Take the atorvastatin once a day This is to reduce cholesterol This will reduce risk of stroke and  Heart attack  Need to see me in 3 months Need blood work with next visit

## 2016-10-29 ENCOUNTER — Ambulatory Visit (HOSPITAL_COMMUNITY): Payer: Medicare Other | Admitting: Oncology

## 2016-10-29 ENCOUNTER — Other Ambulatory Visit (HOSPITAL_COMMUNITY): Payer: Medicare Other

## 2016-10-30 ENCOUNTER — Ambulatory Visit (HOSPITAL_COMMUNITY): Payer: Medicare Other | Admitting: Oncology

## 2016-10-30 ENCOUNTER — Other Ambulatory Visit (HOSPITAL_COMMUNITY): Payer: Medicare Other

## 2016-11-02 ENCOUNTER — Telehealth: Payer: Self-pay | Admitting: Orthopedic Surgery

## 2016-11-02 NOTE — Telephone Encounter (Signed)
ROUTING TO DR HARRISON FOR APPROVAL 

## 2016-11-02 NOTE — Telephone Encounter (Signed)
Hydrocodone-Acetaminophen 5/325 mg  Qty 56 tablets   Please advise

## 2016-11-09 ENCOUNTER — Other Ambulatory Visit: Payer: Self-pay | Admitting: Orthopedic Surgery

## 2016-11-09 MED ORDER — HYDROCODONE-ACETAMINOPHEN 5-325 MG PO TABS: 1.0000 | ORAL_TABLET | Freq: Four times a day (QID) | ORAL | 0 refills | Status: DC | PRN

## 2016-11-09 NOTE — Progress Notes (Signed)
refill 

## 2016-11-18 ENCOUNTER — Encounter (HOSPITAL_COMMUNITY): Payer: Medicare Other | Attending: Adult Health | Admitting: Adult Health

## 2016-11-18 ENCOUNTER — Encounter (HOSPITAL_COMMUNITY): Payer: Self-pay | Admitting: Adult Health

## 2016-11-18 ENCOUNTER — Ambulatory Visit: Payer: Medicare Other

## 2016-11-18 ENCOUNTER — Encounter (HOSPITAL_COMMUNITY): Payer: Medicare Other

## 2016-11-18 ENCOUNTER — Ambulatory Visit (INDEPENDENT_AMBULATORY_CARE_PROVIDER_SITE_OTHER): Payer: Medicare Other

## 2016-11-18 VITALS — BP 134/78 | Temp 98.9°F | Ht 73.0 in | Wt 262.0 lb

## 2016-11-18 VITALS — BP 136/89 | HR 87 | Temp 98.6°F | Resp 18 | Wt 262.6 lb

## 2016-11-18 DIAGNOSIS — D509 Iron deficiency anemia, unspecified: Secondary | ICD-10-CM | POA: Insufficient documentation

## 2016-11-18 DIAGNOSIS — Z0000 Encounter for general adult medical examination without abnormal findings: Secondary | ICD-10-CM | POA: Diagnosis not present

## 2016-11-18 DIAGNOSIS — K317 Polyp of stomach and duodenum: Secondary | ICD-10-CM | POA: Diagnosis not present

## 2016-11-18 DIAGNOSIS — M109 Gout, unspecified: Secondary | ICD-10-CM | POA: Diagnosis not present

## 2016-11-18 DIAGNOSIS — M25569 Pain in unspecified knee: Secondary | ICD-10-CM | POA: Diagnosis not present

## 2016-11-18 DIAGNOSIS — G8929 Other chronic pain: Secondary | ICD-10-CM | POA: Insufficient documentation

## 2016-11-18 DIAGNOSIS — D123 Benign neoplasm of transverse colon: Secondary | ICD-10-CM | POA: Insufficient documentation

## 2016-11-18 DIAGNOSIS — K219 Gastro-esophageal reflux disease without esophagitis: Secondary | ICD-10-CM | POA: Insufficient documentation

## 2016-11-18 DIAGNOSIS — D3A8 Other benign neuroendocrine tumors: Secondary | ICD-10-CM

## 2016-11-18 DIAGNOSIS — E538 Deficiency of other specified B group vitamins: Secondary | ICD-10-CM | POA: Insufficient documentation

## 2016-11-18 DIAGNOSIS — C7A092 Malignant carcinoid tumor of the stomach: Secondary | ICD-10-CM | POA: Diagnosis not present

## 2016-11-18 DIAGNOSIS — Z8711 Personal history of peptic ulcer disease: Secondary | ICD-10-CM | POA: Insufficient documentation

## 2016-11-18 DIAGNOSIS — I10 Essential (primary) hypertension: Secondary | ICD-10-CM | POA: Diagnosis not present

## 2016-11-18 DIAGNOSIS — E039 Hypothyroidism, unspecified: Secondary | ICD-10-CM | POA: Insufficient documentation

## 2016-11-18 LAB — IRON AND TIBC
Iron: 67 ug/dL (ref 45–182)
SATURATION RATIOS: 18 % (ref 17.9–39.5)
TIBC: 372 ug/dL (ref 250–450)
UIBC: 305 ug/dL

## 2016-11-18 LAB — COMPREHENSIVE METABOLIC PANEL
ALBUMIN: 3.7 g/dL (ref 3.5–5.0)
ALT: 18 U/L (ref 17–63)
ANION GAP: 6 (ref 5–15)
AST: 28 U/L (ref 15–41)
Alkaline Phosphatase: 69 U/L (ref 38–126)
BUN: 14 mg/dL (ref 6–20)
CO2: 29 mmol/L (ref 22–32)
Calcium: 9 mg/dL (ref 8.9–10.3)
Chloride: 101 mmol/L (ref 101–111)
Creatinine, Ser: 1.23 mg/dL (ref 0.61–1.24)
GFR calc Af Amer: >60 mL/min (ref >60)
GFR calc non Af Amer: 59 mL/min — ABNORMAL LOW (ref >60)
GLUCOSE: 111 mg/dL — AB (ref 65–99)
POTASSIUM: 3.7 mmol/L (ref 3.5–5.1)
SODIUM: 136 mmol/L (ref 135–145)
Total Bilirubin: 0.5 mg/dL (ref 0.3–1.2)
Total Protein: 8 g/dL (ref 6.5–8.1)

## 2016-11-18 LAB — CBC WITH DIFFERENTIAL/PLATELET
BASOS ABS: 0 10*3/uL (ref 0.0–0.1)
BASOS PCT: 0 %
EOS PCT: 5 %
Eosinophils Absolute: 0.3 10*3/uL (ref 0.0–0.7)
HEMATOCRIT: 43.4 % (ref 39.0–52.0)
Hemoglobin: 14.5 g/dL (ref 13.0–17.0)
Lymphocytes Relative: 36 %
Lymphs Abs: 1.8 10*3/uL (ref 0.7–4.0)
MCH: 30.2 pg (ref 26.0–34.0)
MCHC: 33.4 g/dL (ref 30.0–36.0)
MCV: 90.4 fL (ref 78.0–100.0)
MONO ABS: 0.5 10*3/uL (ref 0.1–1.0)
Monocytes Relative: 9 %
NEUTROS ABS: 2.6 10*3/uL (ref 1.7–7.7)
Neutrophils Relative %: 50 %
PLATELETS: 134 10*3/uL — AB (ref 150–400)
RBC: 4.8 MIL/uL (ref 4.22–5.81)
RDW: 14 % (ref 11.5–15.5)
WBC: 5.2 10*3/uL (ref 4.0–10.5)

## 2016-11-18 LAB — FERRITIN: FERRITIN: 36 ng/mL (ref 24–336)

## 2016-11-18 NOTE — Progress Notes (Signed)
Subjective:   Lantz Hermann is a 66 y.o. male who presents for an Initial Medicare Annual Wellness Visit.  Review of Systems: Cardiac Risk Factors include: advanced age (>20men, >50 women);dyslipidemia;hypertension;obesity (BMI >30kg/m2);sedentary lifestyle    Objective:    Today's Vitals   11/18/16 0844  BP: 134/78  Temp: 98.9 F (37.2 C)  TempSrc: Oral  SpO2: 94%  Weight: 262 lb 0.6 oz (118.9 kg)  Height: 6\' 1"  (1.854 m)   Body mass index is 34.57 kg/m.  Current Medications (verified) Outpatient Encounter Prescriptions as of 11/18/2016  Medication Sig  . acetaminophen (TYLENOL) 500 MG tablet Take 500 mg by mouth every 6 (six) hours as needed for moderate pain.  Marland Kitchen amLODipine (NORVASC) 10 MG tablet Take 1 tablet (10 mg total) by mouth daily.  . febuxostat (ULORIC) 40 MG tablet Take 1 tablet (40 mg total) by mouth daily.  Marland Kitchen FERREX 150 FORTE 150-1-25 MG-MG-MCG CAPS TAKE ONE CAPSULE BY MOUTH DAILY.  Marland Kitchen gabapentin (NEURONTIN) 300 MG capsule Take 300 mg by mouth 2 (two) times daily.   Marland Kitchen HYDROcodone-acetaminophen (NORCO/VICODIN) 5-325 MG tablet Take 1 tablet by mouth every 6 (six) hours as needed for moderate pain.  Marland Kitchen ibuprofen (ADVIL,MOTRIN) 800 MG tablet Take 1 tablet (800 mg total) by mouth every 8 (eight) hours as needed. Take with food  . levothyroxine (SYNTHROID) 200 MCG tablet Take 1 tablet (200 mcg total) by mouth daily before breakfast.  . metoprolol succinate (TOPROL-XL) 25 MG 24 hr tablet Take 1 tablet (25 mg total) by mouth daily.  Marland Kitchen omeprazole (PRILOSEC) 20 MG capsule Take 1 capsule (20 mg total) by mouth daily.  . tamsulosin (FLOMAX) 0.4 MG CAPS capsule Take 1 capsule (0.4 mg total) by mouth daily.  Marland Kitchen atorvastatin (LIPITOR) 20 MG tablet Take 1 tablet (20 mg total) by mouth daily. (Patient not taking: Reported on 11/18/2016)   No facility-administered encounter medications on file as of 11/18/2016.     Allergies (verified) Aspirin   History: Past Medical History:    Diagnosis Date  . Anemia FeDA: GASTRIC POLYPS, B12   TCS 2008 EGD 2009, 2008-HB 11.1 MCV 83.6 CR 1.22, 2009 FERRITIN 102-22  . B12 deficiency   . Carcinoid tumor determined by biopsy of stomach   . Chronic knee pain   . Diverticulosis 2008 LGIB  . DIVERTICULOSIS, COLON 08/15/2006   Qualifier: Diagnosis of  By: Jonna Munro MD, Roderic Scarce    . GERD (gastroesophageal reflux disease)   . Gout   . Hepatomegaly 2o to FATTY LIVER DZ  . History of alcohol abuse   . History of septic arthritis   . HTN (hypertension)   . Hypothyroidism   . Neuroendocrine tumor 09/25/2014  . PUD 11/30/2008   Qualifier: History of  By: Jonna Munro MD, Roderic Scarce    . Substance abuse    Past Surgical History:  Procedure Laterality Date  . BIOPSY  08/14/2014   Procedure: BIOPSY;  Surgeon: Danie Binder, MD;  Location: [facility];  Service: Endoscopy;;  . BIOPSY  03/12/2015   Procedure: BIOPSY;  Surgeon: Danie Binder, MD;  Location: [facility];  Service: Endoscopy;;  . BIOPSY  04/21/2016   Procedure: BIOPSY;  Surgeon: Danie Binder, MD;  Location[facility];  Service: Endoscopy;;  bx's of antrum, body of stomach, fundus, and cardia  . CHOLECYSTECTOMY    . COLONOSCOPY  2008 [facility]   LGIB 2o to TICS, prep good  . COLONOSCOPY WITH PROPOFOL N/A 08/14/2014   SLF: 1. Four  large colon polyps removed. 2. The left colon is redundant 3. Moderate diverticulosis throughout teh entire examined colon  . COLONOSCOPY WITH PROPOFOL N/A 11/06/2014   SLF: 8 small polyps removed. One large pedunculated polyp removed from the ascending colon, tubulovillous and tubular adenomas. Next colonoscopy March 2019  . ESOPHAGOGASTRODUODENOSCOPY  12/08/2007   EYC:XKGY gastric polyps seen in the cardia and body of the stomach/Normal esophagus without evidence of Barrett's, mass, erosion/Normal duodenal bulb and second portion of the duodenum. Benign bx.  . ESOPHAGOGASTRODUODENOSCOPY (EGD) WITH PROPOFOL N/A 08/14/2014   SLF: 1. Heme postive stools due to  gastric and colon polyps 2. Multiple gastric   . ESOPHAGOGASTRODUODENOSCOPY (EGD) WITH PROPOFOL N/A 03/12/2015   SLF: Multiple gastric nodules seen in gasric body/antrum. 2. Non-erosive gastritis ( inflammation) was found in the gastric antrum.   . ESOPHAGOGASTRODUODENOSCOPY (EGD) WITH PROPOFOL N/A 10/08/2015   Procedure: ESOPHAGOGASTRODUODENOSCOPY (EGD) WITH PROPOFOL;  Surgeon: Danie Binder, MD;  [facility];  Service: Endoscopy;  Laterality: N/A;  0945  . ESOPHAGOGASTRODUODENOSCOPY (EGD) WITH PROPOFOL N/A 04/21/2016   Procedure: ESOPHAGOGASTRODUODENOSCOPY (EGD) WITH PROPOFOL;  Surgeon: Danie Binder, MD;  [facility];  Service: Endoscopy;  Laterality: N/A;  730   . HAND SURGERY    . JOINT REPLACEMENT    . POLYPECTOMY  08/14/2014   Procedure: POLYPECTOMY;  Surgeon: Danie Binder, MD;  Location: [facility];  Service: Endoscopy;;  . POLYPECTOMY N/A 11/06/2014   Procedure: POLYPECTOMY;  Surgeon: Danie Binder, MD;  Location: [facility];  Service: Endoscopy;  Laterality: N/A;  Transverse Colon x3, Ascending Colon x2, Descending Colon x3  . REPLACEMENT TOTAL KNEE BILATERAL    . UPPER GASTROINTESTINAL ENDOSCOPY  APR 2009   INFLAMED HYPERPLASTIC POLYPS, CHRONIC GASTRITIS   Family History  Problem Relation Age of Onset  . Diabetes Mother   . Renal Disease Mother     failure  . Diabetes Father     'old age'  . Hypertension Sister   . Hypertension Brother   . Diabetes Brother   . Diabetes Sister   . Diabetes Sister   . Diabetes Brother   . Hypertension Brother   . Diabetes Brother     right BKA  . Kidney failure Brother     on dialysis  . Kidney failure Brother     on dialysis  . Diabetes Brother     bilateral BKA  . Colon polyps Neg Hx   . Colon cancer Neg Hx   . Pancreatic disease Neg Hx    Social History   Occupational History  . retired     farming   Social History Main Topics  . Smoking status: Never Smoker  . Smokeless tobacco: Never Used     Comment:  Quit x 10 years/ never smoked on regular basis  . Alcohol use 1.2 oz/week    2 Shots of liquor per week     Comment: drinks on weekends, gin/vodka 1/5th.  . Drug use: No  . Sexual activity: No   Tobacco Counseling Counseling given: Not Answered   Activities of Daily Living In your present state of health, do you have any difficulty performing the following activities: 11/18/2016 09/22/2016  Hearing? N N  Vision? N N  Difficulty concentrating or making decisions? N N  Walking or climbing stairs? Y N  Dressing or bathing? Y N  Doing errands, shopping? Y N  Preparing Food and eating ? N -  Using the Toilet? N -  In the past six months, have you accidently leaked urine? N -  Do you have problems with loss of bowel control? N -  Managing your Medications? Y -  Managing your Finances? Y -  Housekeeping or managing your Housekeeping? Y -  Some recent data might be hidden    Immunizations and Health Maintenance Immunization History  Administered Date(s) Administered  . Influenza Whole 06/01/2007, 06/09/2007, 06/19/2008   There are no preventive care reminders to display for this patient.  Patient Care Team: Raylene Everts, MD as PCP - General (Family Medicine) Danie Binder, MD (Gastroenterology)  Indicate any recent Medical Services you may have received from other than [facility] providers in the past year (date may be approximate).    Assessment:   This is a routine wellness examination for [employer].  Hearing/Vision screen No exam data present  Dietary issues and exercise activities discussed: Current Exercise Habits: The patient does not participate in regular exercise at present, Exercise limited by: None identified  Goals    . Blood Pressure < 140/90    . Exercise 3x per week (30 min per time)          Recommend starting a regular exercise routine 3 times a week for at least 30 minutes at a time.    . Weight (lb) < 220 lb (99.8 kg)      Depression Screen PHQ 2/9  Scores 11/18/2016 09/22/2016 09/06/2014  PHQ - 2 Score 0 0 0    Fall Risk Fall Risk  11/18/2016 09/22/2016 09/06/2014  Falls in the past year? No No No    Cognitive Function: Normal by direct observation, patient is illiterate.  Screening Tests Health Maintenance  Topic Date Due  . PNA vac Low Risk Adult (1 of 2 - PCV13) 12/11/2016 (Originally 04/19/2014)  . INFLUENZA VACCINE  07/08/2017 (Originally 03/31/2016)  . TETANUS/TDAP  08/26/2017 (Originally 04/19/1970)  . COLONOSCOPY  11/05/2024  . Hepatitis C Screening  Completed        Plan:  I have personally reviewed and addressed the Medicare Annual Wellness questionnaire and have noted the following in the patient's chart:  A. Medical and social history B. Use of alcohol, tobacco or illicit drugs  C. Current medications and supplements D. Functional ability and status E.  Nutritional status F.  Physical activity G. Advance directives H. List of other physicians I.  Hospitalizations, surgeries, and ER visits in previous 12 months J.  Jack to include cognitive, depression, and falls L. Referrals and appointments - Community resource referral to help patient pay for his iron medication.  In addition, I have reviewed and discussed with patient certain preventive protocols, quality metrics, and best practice recommendations. A written personalized care plan for preventive services as well as general preventive health recommendations were provided to patient.  Signed,   Stormy Fabian, LPN Lead Nurse Health Advisor

## 2016-11-18 NOTE — Patient Instructions (Signed)
[place] at [facility] Discharge Instructions  RECOMMENDATIONS MADE BY THE CONSULTANT AND ANY TEST RESULTS WILL BE SENT TO YOUR REFERRING PHYSICIAN.  You were seen today by Mike Craze NP. Return in 4 months for follow up and labs.  Thank you for choosing [place] at [facility] to provide your oncology and hematology care.  To afford each patient quality time with our provider, please arrive at least 15 minutes before your scheduled appointment time.    If you have a lab appointment with the [facility] please come in thru the  Main Entrance and check in at the main information desk  You need to re-schedule your appointment should you arrive 10 or more minutes late.  We strive to give you quality time with our providers, and arriving late affects you and other patients whose appointments are after yours.  Also, if you no show three or more times for appointments you may be dismissed from the clinic at the providers discretion.     Again, thank you for choosing [facility].  Our hope is that these requests will decrease the amount of time that you wait before being seen by our physicians.       _____________________________________________________________  Should you have questions after your visit to [facility], please contact our office at [phone] between the hours of 8:30 a.m. and 4:30 p.m.  Voicemails left after 4:30 p.m. will not be returned until the following business day.  For prescription refill requests, have your pharmacy contact our office.       Resources For Cancer Patients and their Caregivers ? American Cancer Society: Can assist with transportation, wigs, general needs, runs Look Good Feel Better.        805-727-9981 ? Cancer Care: Provides financial assistance, online support groups, medication/co-pay assistance.  1-800-813-HOPE (647)075-4077) ? [place] Assists [place] cancer patients and their families through emotional , educational and financial support.  204 238 2337 ? [place] DSS Where to apply for food stamps, Medicaid and utility assistance. 351 682 1411 ? [facility]: Transportation to medical appointments. (802)491-7265 ? Social Security Administration: May apply for disability if have a Stage IV cancer. 507 772 4725 205-610-8380 ? [employer], Disability and Transit Services: Assists with nutrition, care and transit needs. [place] Support Programs: @10RELATIVEDAYS @ > Cancer Support Group  2nd Tuesday of the month 1pm-2pm, Journey Room  > Creative Journey  3rd Tuesday of the month 1130am-1pm, Journey Room  > Look Good Feel Better  1st Wednesday of the month 10am-12 noon, Journey Room (Call [place] to register [phone])

## 2016-11-18 NOTE — Patient Instructions (Addendum)
[name] , Thank you for taking time to come for your Medicare Wellness Visit. I appreciate your ongoing commitment to your health goals. Please review the following plan we discussed and let me know if I can assist you in the future.   Screening recommendations/referrals: Community resource referral was sent today. Our care guide Amy should reach out to you within 2-4 weeks to help find you assistance with paying for your ferrex medication. Colonoscopy: Up to date, due 10/2024 Recommended yearly ophthalmology/optometry visit for glaucoma screening and checkup Recommended yearly dental visit for hygiene and checkup  Vaccinations: Influenza vaccine: Due NOW Pneumococcal vaccine: Due NOW Tdap vaccine: Due NOW Shingles vaccine: Due NOW   Even though you declined these vaccines today, if you decide you would like to receive any of these please call our office and we can schedule a nurse visit for you.  Advanced directives: Advance directive discussed with you today. If you decide you would like to have this in place, please call our office and I can give you the forms and assist you with filling these out.   Conditions/risks identified: Obesity, recommend starting a routine exercise program 3 times a week for at least 30-45 minutes at a time.   Next appointment: Follow up with Dr. Meda Coffee on 01/20/2017 at 8:40 am. Follow up in 1 year for your annual wellness visit.  Preventive Care 15 Years and Older, Male Preventive care refers to lifestyle choices and visits with your health care provider that can promote health and wellness. What does preventive care include?  A yearly physical exam. This is also called an annual well check.  Dental exams once or twice a year.  Routine eye exams. Ask your health care provider how often you should have your eyes checked.  Personal lifestyle choices, including:  Daily care of your teeth and gums.  Regular physical activity.  Eating a healthy  diet.  Avoiding tobacco and drug use.  Limiting alcohol use.  Practicing safe sex.  Taking low doses of aspirin every day.  Taking vitamin and mineral supplements as recommended by your health care provider. What happens during an annual well check? The services and screenings done by your health care provider during your annual well check will depend on your age, overall health, lifestyle risk factors, and family history of disease. Counseling  Your health care provider may ask you questions about your:  Alcohol use.  Tobacco use.  Drug use.  Emotional well-being.  Home and relationship well-being.  Sexual activity.  Eating habits.  History of falls.  Memory and ability to understand (cognition).  Work and work Statistician. Screening  You may have the following tests or measurements:  Height, weight, and BMI.  Blood pressure.  Lipid and cholesterol levels. These may be checked every 5 years, or more frequently if you are over 63 years old.  Skin check.  Lung cancer screening. You may have this screening every year starting at age 60 if you have a 30-pack-year history of smoking and currently smoke or have quit within the past 15 years.  Fecal occult blood test (FOBT) of the stool. You may have this test every year starting at age 52.  Flexible sigmoidoscopy or colonoscopy. You may have a sigmoidoscopy every 5 years or a colonoscopy every 10 years starting at age 29.  Prostate cancer screening. Recommendations will vary depending on your family history and other risks.  Hepatitis C blood test.  Hepatitis B blood test.  Sexually transmitted disease (  STD) testing.  Diabetes screening. This is done by checking your blood sugar (glucose) after you have not eaten for a while (fasting). You may have this done every 1-3 years.  Abdominal aortic aneurysm (AAA) screening. You may need this if you are a current or former smoker.  Osteoporosis. You may be screened  starting at age 53 if you are at high risk. Talk with your health care provider about your test results, treatment options, and if necessary, the need for more tests. Vaccines  Your health care provider may recommend certain vaccines, such as:  Influenza vaccine. This is recommended every year.  Tetanus, diphtheria, and acellular pertussis (Tdap, Td) vaccine. You may need a Td booster every 10 years.  Zoster vaccine. You may need this after age 66.  Pneumococcal 13-valent conjugate (PCV13) vaccine. One dose is recommended after age 76.  Pneumococcal polysaccharide (PPSV23) vaccine. One dose is recommended after age 60. Talk to your health care provider about which screenings and vaccines you need and how often you need them. This information is not intended to replace advice given to you by your health care provider. Make sure you discuss any questions you have with your health care provider. Document Released: 09/13/2015 Document Revised: 05/06/2016 Document Reviewed: 06/18/2015 Elsevier Interactive Patient Education  2017 [place] Prevention in the Home Falls can cause injuries. They can happen to people of all ages. There are many things you can do to make your home safe and to help prevent falls. What can I do on the outside of my home?  Regularly fix the edges of walkways and driveways and fix any cracks.  Remove anything that might make you trip as you walk through a door, such as a raised step or threshold.  Trim any bushes or trees on the path to your home.  Use bright outdoor lighting.  Clear any walking paths of anything that might make someone trip, such as rocks or tools.  Regularly check to see if handrails are loose or broken. Make sure that both sides of any steps have handrails.  Any raised decks and porches should have guardrails on the edges.  Have any leaves, snow, or ice cleared regularly.  Use sand or salt on walking paths during  winter.  Clean up any spills in your garage right away. This includes oil or grease spills. What can I do in the bathroom?  Use night lights.  Install grab bars by the toilet and in the tub and shower. Do not use towel bars as grab bars.  Use non-skid mats or decals in the tub or shower.  If you need to sit down in the shower, use a plastic, non-slip stool.  Keep the floor dry. Clean up any water that spills on the floor as soon as it happens.  Remove soap buildup in the tub or shower regularly.  Attach bath mats securely with double-sided non-slip rug tape.  Do not have throw rugs and other things on the floor that can make you trip. What can I do in the bedroom?  Use night lights.  Make sure that you have a light by your bed that is easy to reach.  Do not use any sheets or blankets that are too big for your bed. They should not hang down onto the floor.  Have a firm chair that has side arms. You can use this for support while you get dressed.  Do not have throw rugs and other things on the  floor that can make you trip. What can I do in the kitchen?  Clean up any spills right away.  Avoid walking on wet floors.  Keep items that you use a lot in easy-to-reach places.  If you need to reach something above you, use a strong step stool that has a grab bar.  Keep electrical cords out of the way.  Do not use floor polish or wax that makes floors slippery. If you must use wax, use non-skid floor wax.  Do not have throw rugs and other things on the floor that can make you trip. What can I do with my stairs?  Do not leave any items on the stairs.  Make sure that there are handrails on both sides of the stairs and use them. Fix handrails that are broken or loose. Make sure that handrails are as long as the stairways.  Check any carpeting to make sure that it is firmly attached to the stairs. Fix any carpet that is loose or worn.  Avoid having throw rugs at the top or  bottom of the stairs. If you do have throw rugs, attach them to the floor with carpet tape.  Make sure that you have a light switch at the top of the stairs and the bottom of the stairs. If you do not have them, ask someone to add them for you. What else can I do to help prevent falls?  Wear shoes that:  Do not have high heels.  Have rubber bottoms.  Are comfortable and fit you well.  Are closed at the toe. Do not wear sandals.  If you use a stepladder:  Make sure that it is fully opened. Do not climb a closed stepladder.  Make sure that both sides of the stepladder are locked into place.  Ask someone to hold it for you, if possible.  Clearly mark and make sure that you can see:  Any grab bars or handrails.  First and last steps.  Where the edge of each step is.  Use tools that help you move around (mobility aids) if they are needed. These include:  Canes.  Walkers.  Scooters.  Crutches.  Turn on the lights when you go into a dark area. Replace any light bulbs as soon as they burn out.  Set up your furniture so you have a clear path. Avoid moving your furniture around.  If any of your floors are uneven, fix them.  If there are any pets around you, be aware of where they are.  Review your medicines with your doctor. Some medicines can make you feel dizzy. This can increase your chance of falling. Ask your doctor what other things that you can do to help prevent falls. This information is not intended to replace advice given to you by your health care provider. Make sure you discuss any questions you have with your health care provider. Document Released: 06/13/2009 Document Revised: 01/23/2016 Document Reviewed: 09/21/2014 Elsevier Interactive Patient Education  2017 [employer].

## 2016-11-18 NOTE — Progress Notes (Signed)
[name], [place] [id number]   CLINIC:  Medical Oncology/Hematology  PCP:  Raylene Everts, MD [phone] [address] [place] [address] [phone]   REASON FOR VISIT:  Follow-up for Neuroendocrine tumor of stomach AND Iron deficiency anemia   CURRENT THERAPY: Observation AND oral iron (Ferrex forte)   HISTORY OF PRESENT ILLNESS:  (From Kirby Crigler, PA-C's last note on 07/08/16)       INTERVAL HISTORY:  [name] 66 y.o. King returns for routine follow-up for history of well-differentiated neuroendocrine tumor of stomach polyp, s/p resection; also follow-up for iron deficiency anemia.   Overall, he reports feeling very well.  He last saw Dr. Oneida Alar in 08/2016; she recommended he follow-up in 1 year. Per [name], he will have EGD at that time.  He continues to tolerate oral iron quite well.    Denies any fatigue or changes in appetite. Denies any blood in his stool/melena. No active bleeding noted. Denies any new abdominal pain, trouble swallowing, or N&V.  He is largely with no complaints today.   For fun, he likes to watch TV; specifically soap operas (the Young & the Restless is his favorite) and wrestling.    REVIEW OF SYSTEMS:  Review of Systems  Constitutional: Negative.  Negative for chills, fatigue and fever.  HENT:  Negative.  Negative for lump/mass and nosebleeds.   Eyes: Negative.   Respiratory: Negative.  Negative for cough and shortness of breath.   Cardiovascular: Negative.  Negative for chest pain and leg swelling.  Gastrointestinal: Negative.  Negative for abdominal pain, blood in stool, constipation, diarrhea, nausea and vomiting.  Endocrine: Negative.   Genitourinary: Negative.  Negative for dysuria and hematuria.   Musculoskeletal: Negative.  Negative for arthralgias.  Skin: Negative.  Negative for rash.  Neurological: Negative.  Negative for dizziness and headaches.  Hematological: Negative.  Negative for  adenopathy. Does not bruise/bleed easily.  Psychiatric/Behavioral: Negative.  Negative for depression and sleep disturbance. The patient is not nervous/anxious.      PAST MEDICAL/SURGICAL HISTORY:  Past Medical History:  Diagnosis Date  . Anemia FeDA: GASTRIC POLYPS, B12   TCS 2008 EGD 2009, 2008-HB 11.1 MCV 83.6 CR 1.22, 2009 FERRITIN 102-22  . B12 deficiency   . Carcinoid tumor determined by biopsy of stomach   . Chronic knee pain   . Diverticulosis 2008 LGIB  . DIVERTICULOSIS, COLON 08/15/2006   Qualifier: Diagnosis of  By: Jonna Munro MD, Roderic Scarce    . GERD (gastroesophageal reflux disease)   . Gout   . Hepatomegaly 2o to FATTY LIVER DZ  . History of alcohol abuse   . History of septic arthritis   . HTN (hypertension)   . Hypothyroidism   . Neuroendocrine tumor 09/25/2014  . PUD 11/30/2008   Qualifier: History of  By: Jonna Munro MD, Roderic Scarce    . Substance abuse    Past Surgical History:  Procedure Laterality Date  . BIOPSY  08/14/2014   Procedure: BIOPSY;  Surgeon: Danie Binder, MD;  Location: [facility];  Service: Endoscopy;;  . BIOPSY  03/12/2015   Procedure: BIOPSY;  Surgeon: Danie Binder, MD;  Location: [facility];  Service: Endoscopy;;  . BIOPSY  04/21/2016   Procedure: BIOPSY;  Surgeon: Danie Binder, MD;  Location: [facility];  Service: Endoscopy;;  bx's of antrum, body of stomach, fundus, and cardia  . CHOLECYSTECTOMY    . COLONOSCOPY  2008 [facility] DJ   LGIB 2o  to TICS, prep good  . COLONOSCOPY WITH PROPOFOL N/A 08/14/2014   SLF: 1. Four large colon polyps removed. 2. The left colon is redundant 3. Moderate diverticulosis throughout teh entire examined colon  . COLONOSCOPY WITH PROPOFOL N/A 11/06/2014   SLF: 8 small polyps removed. One large pedunculated polyp removed from the ascending colon, tubulovillous and tubular adenomas. Next colonoscopy March 2019  . ESOPHAGOGASTRODUODENOSCOPY  12/08/2007   VEH:MCNO gastric polyps seen in the cardia and body of the stomach/Normal  esophagus without evidence of Barrett's, mass, erosion/Normal duodenal bulb and second portion of the duodenum. Benign bx.  . ESOPHAGOGASTRODUODENOSCOPY (EGD) WITH PROPOFOL N/A 08/14/2014   SLF: 1. Heme postive stools due to gastric and colon polyps 2. Multiple gastric   . ESOPHAGOGASTRODUODENOSCOPY (EGD) WITH PROPOFOL N/A 03/12/2015   SLF: Multiple gastric nodules seen in gasric body/antrum. 2. Non-erosive gastritis ( inflammation) was found in the gastric antrum.   . ESOPHAGOGASTRODUODENOSCOPY (EGD) WITH PROPOFOL N/A 10/08/2015   Procedure: ESOPHAGOGASTRODUODENOSCOPY (EGD) WITH PROPOFOL;  Surgeon: Danie Binder, MD;  Location: [facility];  Service: Endoscopy;  Laterality: N/A;  0945  . ESOPHAGOGASTRODUODENOSCOPY (EGD) WITH PROPOFOL N/A 04/21/2016   Procedure: ESOPHAGOGASTRODUODENOSCOPY (EGD) WITH PROPOFOL;  Surgeon: Danie Binder, MD;  Location: [facility];  Service: Endoscopy;  Laterality: N/A;  730   . HAND SURGERY    . JOINT REPLACEMENT    . POLYPECTOMY  08/14/2014   Procedure: POLYPECTOMY;  Surgeon: Danie Binder, MD;  Location: [facility];  Service: Endoscopy;;  . POLYPECTOMY N/A 11/06/2014   Procedure: POLYPECTOMY;  Surgeon: Danie Binder, MD;  Location: [facility];  Service: Endoscopy;  Laterality: N/A;  Transverse Colon x3, Ascending Colon x2, Descending Colon x3  . REPLACEMENT TOTAL KNEE BILATERAL    . UPPER GASTROINTESTINAL ENDOSCOPY  APR 2009   INFLAMED HYPERPLASTIC POLYPS, CHRONIC GASTRITIS     SOCIAL HISTORY:  Social History   Social History  . Marital status: Single    Spouse name: N/A  . Number of children: 0  . Years of education: 3   Occupational History  . retired     farming   Social History Main Topics  . Smoking status: Never Smoker  . Smokeless tobacco: Never Used     Comment: Quit x 10 years/ never smoked on regular basis  . Alcohol use 1.2 oz/week    2 Shots of liquor per week     Comment: drinks on weekends, gin/vodka 1/5th.  . Drug use: No  . Sexual  activity: No   Other Topics Concern  . Not on file   Social History Narrative   HE DOES NOT HAVE ANY CHILDREN.   Lives alone   Does not drive   CAN NOT READ    FAMILY HISTORY:  Family History  Problem Relation Age of Onset  . Diabetes Mother   . Renal Disease Mother     failure  . Diabetes Father     'old age'  . Hypertension Sister   . Hypertension Brother   . Diabetes Brother   . Diabetes Sister   . Diabetes Sister   . Diabetes Brother   . Hypertension Brother   . Diabetes Brother     right BKA  . Kidney failure Brother     on dialysis  . Kidney failure Brother     on dialysis  . Diabetes Brother     bilateral BKA  . Colon polyps Neg Hx   . Colon cancer Neg Hx   .  Pancreatic disease Neg Hx     CURRENT MEDICATIONS:  Outpatient Encounter Prescriptions as of 11/18/2016  Medication Sig  . acetaminophen (TYLENOL) 500 MG tablet Take 500 mg by mouth every 6 (six) hours as needed for moderate pain.  Marland Kitchen amLODipine (NORVASC) 10 MG tablet Take 1 tablet (10 mg total) by mouth daily.  Marland Kitchen atorvastatin (LIPITOR) 20 MG tablet Take 1 tablet (20 mg total) by mouth daily.  . febuxostat (ULORIC) 40 MG tablet Take 1 tablet (40 mg total) by mouth daily.  Marland Kitchen FERREX 150 FORTE 150-1-25 MG-MG-MCG CAPS TAKE ONE CAPSULE BY MOUTH DAILY.  Marland Kitchen gabapentin (NEURONTIN) 300 MG capsule Take 300 mg by mouth 2 (two) times daily.   Marland Kitchen HYDROcodone-acetaminophen (NORCO/VICODIN) 5-325 MG tablet Take 1 tablet by mouth every 6 (six) hours as needed for moderate pain.  Marland Kitchen ibuprofen (ADVIL,MOTRIN) 800 MG tablet Take 1 tablet (800 mg total) by mouth every 8 (eight) hours as needed. Take with food  . levothyroxine (SYNTHROID) 200 MCG tablet Take 1 tablet (200 mcg total) by mouth daily before breakfast.  . metoprolol succinate (TOPROL-XL) 25 MG 24 hr tablet Take 1 tablet (25 mg total) by mouth daily.  Marland Kitchen omeprazole (PRILOSEC) 20 MG capsule Take 1 capsule (20 mg total) by mouth daily.  . tamsulosin (FLOMAX) 0.4 MG CAPS  capsule Take 1 capsule (0.4 mg total) by mouth daily.   No facility-administered encounter medications on file as of 11/18/2016.     ALLERGIES:  Allergies  Allergen Reactions  . Aspirin     REACTION: Diverticular Bleed     PHYSICAL EXAM:  ECOG Performance status: 0 - Asymptomatic & independent  Vitals:   11/18/16 1001  BP: 136/89  Pulse: 87  Resp: 18  Temp: 98.6 F (37 C)   Filed Weights   11/18/16 1001  Weight: 262 lb 9.6 oz (119.1 kg)    Physical Exam  Constitutional: He is oriented to person, place, and time and well-developed, well-nourished, and in no distress.  HENT:  Head: Normocephalic.  Mouth/Throat: Oropharynx is clear and moist. No oropharyngeal exudate.  Eyes: Conjunctivae are normal. Pupils are equal, round, and reactive to light. No scleral icterus.  Neck: Normal range of motion. Neck supple.  Cardiovascular: Normal rate, regular rhythm and normal heart sounds.   Pulmonary/Chest: Effort normal and breath sounds normal. No respiratory distress.  Abdominal: Soft. Bowel sounds are normal. There is no tenderness.  Musculoskeletal: Normal range of motion. He exhibits no edema.  Lymphadenopathy:    He has no cervical adenopathy.       Right: No supraclavicular adenopathy present.       Left: No supraclavicular adenopathy present.  Neurological: He is alert and oriented to person, place, and time. No cranial nerve deficit. Gait normal.  Skin: Skin is warm and dry. No rash noted.  Psychiatric: Mood, memory, affect and judgment normal.  Nursing note and vitals reviewed.    LABORATORY DATA:  I have reviewed the labs as listed.  CBC    Component Value Date/Time   WBC 5.2 11/18/2016 0926   RBC 4.80 11/18/2016 0926   HGB 14.5 11/18/2016 0926   HCT 43.4 11/18/2016 0926   HCT 44 01/05/2013   PLT 134 (L) 11/18/2016 0926   PLT 148 01/05/2013   MCV 90.4 11/18/2016 0926   MCV 90.4 01/05/2013   MCH 30.2 11/18/2016 0926   MCHC 33.4 11/18/2016 0926   RDW 14.0  11/18/2016 0926   LYMPHSABS 1.8 11/18/2016 0926   MONOABS 0.5 11/18/2016  0926   EOSABS 0.3 11/18/2016 0926   BASOSABS 0.0 11/18/2016 0926   CMP Latest Ref Rng & Units 11/18/2016 10/16/2016 07/08/2016  Glucose 65 - 99 mg/dL 111(H) 105(H) 115(H)  BUN 6 - 20 mg/dL 14 14 15   Creatinine 0.61 - 1.24 mg/dL 1.23 1.27(H) 1.42(H)  Sodium 135 - 145 mmol/L 136 138 138  Potassium 3.5 - 5.1 mmol/L 3.7 3.9 3.5  Chloride 101 - 111 mmol/L 101 102 103  CO2 22 - 32 mmol/L 29 25 27   Calcium 8.9 - 10.3 mg/dL 9.0 9.0 9.1  Total Protein 6.5 - 8.1 g/dL 8.0 7.5 8.1  Total Bilirubin 0.3 - 1.2 mg/dL 0.5 0.6 1.0  Alkaline Phos 38 - 126 U/L 69 57 66  AST 15 - 41 U/L 28 29 49(H)  ALT 17 - 63 U/L 18 16 26     PENDING LABS:  Chromogranin A, serum serotonin, and iron panel pending     DIAGNOSTIC IMAGING:    PATHOLOGY:  Last biopsies with GI (Dr. Oneida Alar): 04/21/16     ASSESSMENT & PLAN:   Neuroendocrine carcinoid tumor of stomach: -s/p polypectomy with Dr. Oneida Alar in 07/2014. Octreotide scan in 08/2014 negative. Underwent additional polypectomy in 10/2015.  Anatomic biopsies in 03/2016 negative.  -Last visit with GI in 08/2016 with plans to return to their clinic in 1 year; per patient, he will have EGD at that time.  -We have been monitoring serial chromogranin A and serum serotonin levels. While neither labs are definitive/specific tumor markers for neuroendocrine carcinoid tumors, monitoring trends in these labs can be helpful.  Last chromogranin A was normal at 2 and serotonin normal at 131 in 07/2016. Labs pending for today.  -Return to cancer center in 4 months with labs.   Iron deficiency anemia: -Last ferritin 43 in 07/2016. He remains on oral iron supplementation and is tolerating without side effects.  -Ferritin pending. Continue oral iron for now; will consider supplementing with IV iron in the future, if needed.      Dispo:  -Return to cancer center in 4 months with labs.    All questions were  answered to patient's stated satisfaction. Encouraged patient to call with any new concerns or questions before his next visit to the cancer center and we can certain see him sooner, if needed.      Orders placed this encounter:  Orders Placed This Encounter  Procedures  . Iron and TIBC  . Ferritin  . Comprehensive metabolic panel  . CBC with Differential/Platelet  . Chromogranin A  . Serotonin serum      Mike Craze, NP [place] [phone]

## 2016-11-20 LAB — SEROTONIN SERUM: Serotonin, Serum: 95 ng/mL (ref 21–321)

## 2016-11-20 LAB — CHROMOGRANIN A: CHROMOGRANIN A: 3 nmol/L (ref 0–5)

## 2016-11-23 ENCOUNTER — Other Ambulatory Visit: Payer: Self-pay | Admitting: Family Medicine

## 2016-11-23 MED ORDER — FEBUXOSTAT 40 MG PO TABS: 40.0000 mg | ORAL_TABLET | Freq: Every day | ORAL | 3 refills | Status: DC

## 2016-11-23 NOTE — Telephone Encounter (Signed)
Patient is asking for a refill on febuxostat (ULORIC) 40 MG tablet please advise?

## 2016-11-23 NOTE — Telephone Encounter (Signed)
Seen 2 23 18

## 2016-11-26 ENCOUNTER — Telehealth (HOSPITAL_COMMUNITY): Payer: Self-pay | Admitting: Oncology

## 2016-11-26 ENCOUNTER — Other Ambulatory Visit (HOSPITAL_COMMUNITY): Payer: Self-pay | Admitting: Oncology

## 2016-11-26 DIAGNOSIS — D508 Other iron deficiency anemias: Secondary | ICD-10-CM

## 2016-11-26 MED ORDER — POLYSACCHAR IRON-FA-B12 150-1-25 MG-MG-MCG PO CAPS: 1.0000 | ORAL_CAPSULE | Freq: Every day | ORAL | 4 refills | Status: DC

## 2016-11-26 NOTE — Telephone Encounter (Signed)
Rcvd a call from [employer] stating the pt is having trouble paying for his Tommas Olp which is not covered by medicare. Pt has to pay $19 for his monthly copay. There is no assistance for ferrex so I reached out to [employer] for a cash price. 30 caps $9.95 60 caps $14.90 90 caps $19.00 I contacted the pt and he is willing to pay $19 for a 90 day supply. I will have the meds shipped here and the pt will pay for them.

## 2016-12-02 MED FILL — FERREX 150 FORTE CAPSULE: 150-1-25 | 90 days supply | Qty: 90 | Fill #0

## 2016-12-07 ENCOUNTER — Telehealth: Payer: Self-pay | Admitting: Orthopedic Surgery

## 2016-12-07 NOTE — Telephone Encounter (Signed)
Patient requests a refill on Hydrocodone/Acetaminophen 5-325  mgs.   Qty  30   Sig: Take 1 tablet by mouth every 6 (six) hours as needed for moderate pain.

## 2016-12-07 NOTE — Telephone Encounter (Signed)
ROUTING TO DR HARRISON 

## 2016-12-08 ENCOUNTER — Emergency Department (HOSPITAL_COMMUNITY): Payer: Medicare Other

## 2016-12-08 ENCOUNTER — Other Ambulatory Visit: Payer: Self-pay | Admitting: *Deleted

## 2016-12-08 ENCOUNTER — Emergency Department (HOSPITAL_COMMUNITY)
Admission: EM | Admit: 2016-12-08 | Discharge: 2016-12-08 | Disposition: A | Discharge status: Home / Self Care | Payer: Medicare Other | Attending: Emergency Medicine | Admitting: Emergency Medicine

## 2016-12-08 ENCOUNTER — Encounter (HOSPITAL_COMMUNITY): Payer: Self-pay | Admitting: Emergency Medicine

## 2016-12-08 DIAGNOSIS — J181 Lobar pneumonia, unspecified organism: Secondary | ICD-10-CM | POA: Insufficient documentation

## 2016-12-08 DIAGNOSIS — E039 Hypothyroidism, unspecified: Secondary | ICD-10-CM | POA: Diagnosis not present

## 2016-12-08 DIAGNOSIS — I10 Essential (primary) hypertension: Secondary | ICD-10-CM | POA: Insufficient documentation

## 2016-12-08 DIAGNOSIS — Z79899 Other long term (current) drug therapy: Secondary | ICD-10-CM | POA: Insufficient documentation

## 2016-12-08 DIAGNOSIS — R042 Hemoptysis: Secondary | ICD-10-CM

## 2016-12-08 DIAGNOSIS — R093 Abnormal sputum: Secondary | ICD-10-CM | POA: Diagnosis not present

## 2016-12-08 LAB — CBC WITH DIFFERENTIAL/PLATELET
BASOS ABS: 0 10*3/uL (ref 0.0–0.1)
BASOS PCT: 0 %
Eosinophils Absolute: 0.1 10*3/uL (ref 0.0–0.7)
Eosinophils Relative: 2 %
HEMATOCRIT: 46 % (ref 39.0–52.0)
HEMOGLOBIN: 15.3 g/dL (ref 13.0–17.0)
LYMPHS PCT: 34 %
Lymphs Abs: 1.9 10*3/uL (ref 0.7–4.0)
MCH: 30.1 pg (ref 26.0–34.0)
MCHC: 33.3 g/dL (ref 30.0–36.0)
MCV: 90.6 fL (ref 78.0–100.0)
Monocytes Absolute: 0.7 10*3/uL (ref 0.1–1.0)
Monocytes Relative: 11 %
NEUTROS ABS: 3 10*3/uL (ref 1.7–7.7)
NEUTROS PCT: 53 %
Platelets: 141 10*3/uL — ABNORMAL LOW (ref 150–400)
RBC: 5.08 MIL/uL (ref 4.22–5.81)
RDW: 14.1 % (ref 11.5–15.5)
WBC: 5.7 10*3/uL (ref 4.0–10.5)

## 2016-12-08 LAB — BASIC METABOLIC PANEL
Anion gap: 12 (ref 5–15)
BUN: 16 mg/dL (ref 6–20)
CALCIUM: 9.2 mg/dL (ref 8.9–10.3)
CO2: 28 mmol/L (ref 22–32)
CREATININE: 1.46 mg/dL — AB (ref 0.61–1.24)
Chloride: 98 mmol/L — ABNORMAL LOW (ref 101–111)
GFR calc Af Amer: 56 mL/min — ABNORMAL LOW (ref >60)
GFR, EST NON AFRICAN AMERICAN: 48 mL/min — AB (ref >60)
Glucose, Bld: 115 mg/dL — ABNORMAL HIGH (ref 65–99)
Potassium: 3.7 mmol/L (ref 3.5–5.1)
Sodium: 138 mmol/L (ref 135–145)

## 2016-12-08 MED ORDER — HYDROCODONE-ACETAMINOPHEN 5-325 MG PO TABS: 1.0000 | ORAL_TABLET | Freq: Four times a day (QID) | ORAL | 0 refills | Status: DC | PRN

## 2016-12-08 MED ORDER — AMOXICILLIN 500 MG PO CAPS: 500.0000 mg | ORAL_CAPSULE | Freq: Three times a day (TID) | ORAL | 0 refills | Status: DC

## 2016-12-08 MED ORDER — AZITHROMYCIN 250 MG PO TABS: 250.0000 mg | ORAL_TABLET | Freq: Every day | ORAL | 0 refills | Status: DC

## 2016-12-08 NOTE — Discharge Instructions (Signed)
You look like you may have a pneumonia on your chest x-ray. Please take antibiotics as prescribed.  Please return without fail for worsening symptoms, including fever, difficulty breathing, severe chest pain, passing out, worsening bleeding (not just a streak of blood in sputum), or any other symptoms concerning to you.

## 2016-12-08 NOTE — ED Provider Notes (Signed)
[facility] DEPT Provider Note   CSN: [id number] Arrival date & time: 12/08/16  6789   By signing my name below, I, Eunice Blase, attest that this documentation has been prepared under the direction and in the presence of Forde Dandy, MD. Electronically signed, Eunice Blase, ED Scribe. 12/08/16. 1:05 PM.   History   Chief Complaint Chief Complaint  Patient presents with  . Hemoptysis   The history is provided by the patient and medical records. No language interpreter was used.  Chest Pain   This is a new problem. The current episode started yesterday. The problem occurs daily. The problem has been gradually improving. The pain is associated with coughing. The pain is present in the lateral region. The pain is at a severity of 5/10. The quality of the pain is described as dull. The pain does not radiate. Associated symptoms include cough and hemoptysis. Pertinent negatives include no fever, no leg pain, no shortness of breath and no vomiting. Risk factors include lack of exercise.  Cough  This is a new problem. The current episode started yesterday. The problem has been resolved. The cough is productive of sputum. There has been no fever. Associated symptoms include chest pain. Pertinent negatives include no chills, no rhinorrhea and no shortness of breath. He has tried nothing for the symptoms. He is not a smoker. His past medical history does not include COPD or asthma.    Alver Leete is a 66 y.o. male with h/o anemia, alcohol abuse and HLD, transported by private vehicle to the Emergency Department with concern for blood tinged productive cough last night. He notes Hx of similar "a while" ago with possible infection. He notes associated R sided 5/10 sore rib pain and occasional chest pain in the past, but nothing recently while all of this is going on. He denies vomiting, fever, chills, SOB, bloody stools, congestion, rhinorrhea, immobilizations, blood thinner use, recent  hospitalizations and h/o tobacco use, COPD, asthma, clots.  Past Medical History:  Diagnosis Date  . Anemia FeDA: GASTRIC POLYPS, B12   TCS 2008 EGD 2009, 2008-HB 11.1 MCV 83.6 CR 1.22, 2009 FERRITIN 102-22  . B12 deficiency   . Carcinoid tumor determined by biopsy of stomach   . Chronic knee pain   . Diverticulosis 2008 LGIB  . DIVERTICULOSIS, COLON 08/15/2006   Qualifier: Diagnosis of  By: Jonna Munro MD, Roderic Scarce    . GERD (gastroesophageal reflux disease)   . Gout   . Hepatomegaly 2o to FATTY LIVER DZ  . History of alcohol abuse   . History of septic arthritis   . HTN (hypertension)   . Hypothyroidism   . Neuroendocrine tumor 09/25/2014  . PUD 11/30/2008   Qualifier: History of  By: Jonna Munro MD, Roderic Scarce    . Substance abuse     Patient Active Problem List   Diagnosis Date Noted  . Hyperlipidemia 10/23/2016  . Mild intellectual disabilities (CODE) 09/29/2016  . Personal history of gout 09/22/2016  . Literacy level of illiterate 09/22/2016  . History of bilateral knee replacement 09/22/2016  . Carcinoid tumor determined by biopsy of stomach 04/10/2016  . Tubulovillous adenoma of large intestine 09/12/2015  . Neuroendocrine tumor 09/25/2014  . Elevated LFTs 07/17/2014  . Iron deficiency anemia 07/29/2010  . Fatty liver 07/29/2010  . ABUSE, ALCOHOL, UNSPECIFIED 11/30/2008  . Osteoarthrosis involving lower leg 08/20/2008  . Essential hypertension 10/20/2006  . B12 deficiency 08/15/2006  . GERD 08/15/2006  . IBS 08/15/2006  . HYPRTRPHY PROSTATE BNG W/O URINARY OBST/LUTS  08/15/2006  . OSTEOARTHRITIS 08/15/2006  . DEGENERATION, DISC NOS 08/15/2006    Past Surgical History:  Procedure Laterality Date  . BIOPSY  08/14/2014   Procedure: BIOPSY;  Surgeon: Danie Binder, MD;  Location: [facility];  Service: Endoscopy;;  . BIOPSY  03/12/2015   Procedure: BIOPSY;  Surgeon: Danie Binder, MD;  Location: [facility];  Service: Endoscopy;;  . BIOPSY  04/21/2016   Procedure: BIOPSY;   Surgeon: Danie Binder, MD;  Location: [facility];  Service: Endoscopy;;  bx's of antrum, body of stomach, fundus, and cardia  . CHOLECYSTECTOMY    . COLONOSCOPY  2008 [facility] DJ   LGIB 2o to TICS, prep good  . COLONOSCOPY WITH PROPOFOL N/A 08/14/2014   SLF: 1. Four large colon polyps removed. 2. The left colon is redundant 3. Moderate diverticulosis throughout teh entire examined colon  . COLONOSCOPY WITH PROPOFOL N/A 11/06/2014   SLF: 8 small polyps removed. One large pedunculated polyp removed from the ascending colon, tubulovillous and tubular adenomas. Next colonoscopy March 2019  . ESOPHAGOGASTRODUODENOSCOPY  12/08/2007   BHA:LPFX gastric polyps seen in the cardia and body of the stomach/Normal esophagus without evidence of Barrett's, mass, erosion/Normal duodenal bulb and second portion of the duodenum. Benign bx.  . ESOPHAGOGASTRODUODENOSCOPY (EGD) WITH PROPOFOL N/A 08/14/2014   SLF: 1. Heme postive stools due to gastric and colon polyps 2. Multiple gastric   . ESOPHAGOGASTRODUODENOSCOPY (EGD) WITH PROPOFOL N/A 03/12/2015   SLF: Multiple gastric nodules seen in gasric body/antrum. 2. Non-erosive gastritis ( inflammation) was found in the gastric antrum.   . ESOPHAGOGASTRODUODENOSCOPY (EGD) WITH PROPOFOL N/A 10/08/2015   Procedure: ESOPHAGOGASTRODUODENOSCOPY (EGD) WITH PROPOFOL;  Surgeon: Danie Binder, MD;  Location: [facility];  Service: Endoscopy;  Laterality: N/A;  0945  . ESOPHAGOGASTRODUODENOSCOPY (EGD) WITH PROPOFOL N/A 04/21/2016   Procedure: ESOPHAGOGASTRODUODENOSCOPY (EGD) WITH PROPOFOL;  Surgeon: Danie Binder, MD;  Location: [facility];  Service: Endoscopy;  Laterality: N/A;  730   . HAND SURGERY    . JOINT REPLACEMENT    . POLYPECTOMY  08/14/2014   Procedure: POLYPECTOMY;  Surgeon: Danie Binder, MD;  Location: [facility];  Service: Endoscopy;;  . POLYPECTOMY N/A 11/06/2014   Procedure: POLYPECTOMY;  Surgeon: Danie Binder, MD;  Location: [facility];  Service: Endoscopy;   Laterality: N/A;  Transverse Colon x3, Ascending Colon x2, Descending Colon x3  . REPLACEMENT TOTAL KNEE BILATERAL    . UPPER GASTROINTESTINAL ENDOSCOPY  APR 2009   INFLAMED HYPERPLASTIC POLYPS, CHRONIC GASTRITIS       Home Medications    Prior to Admission medications   Medication Sig Start Date End Date Taking? Authorizing Provider  acetaminophen (TYLENOL) 500 MG tablet Take 500 mg by mouth every 6 (six) hours as needed for moderate pain.   Yes Historical Provider, MD  amLODipine (NORVASC) 10 MG tablet Take 1 tablet (10 mg total) by mouth daily. 09/22/16  Yes Raylene Everts, MD  atorvastatin (LIPITOR) 20 MG tablet Take 1 tablet (20 mg total) by mouth daily. 10/23/16  Yes Raylene Everts, MD  febuxostat (ULORIC) 40 MG tablet Take 1 tablet (40 mg total) by mouth daily. 11/23/16  Yes Raylene Everts, MD  gabapentin (NEURONTIN) 300 MG capsule Take 300 mg by mouth 2 (two) times daily.  09/22/16  Yes Historical Provider, MD  HYDROcodone-acetaminophen (NORCO/VICODIN) 5-325 MG tablet Take 1 tablet by mouth every 6 (six) hours as needed for moderate pain. 11/09/16  Yes Carole Civil, MD  ibuprofen (ADVIL,MOTRIN) 800 MG tablet Take 1 tablet (800 mg total) by mouth every 8 (eight) hours as needed. Take with food 10/23/16  Yes Raylene Everts, MD  levothyroxine (SYNTHROID) 200 MCG tablet Take 1 tablet (200 mcg total) by mouth daily before breakfast. 10/19/16  Yes Raylene Everts, MD  metoprolol succinate (TOPROL-XL) 25 MG 24 hr tablet Take 1 tablet (25 mg total) by mouth daily. 09/22/16  Yes Raylene Everts, MD  omeprazole (PRILOSEC) 20 MG capsule Take 1 capsule (20 mg total) by mouth daily. 09/30/16  Yes Danie Binder, MD  Polysacchar Iron-FA-B12 (FERREX 150 FORTE) 150-1-25 MG-MG-MCG CAPS Take 1 capsule by mouth daily. 11/26/16  Yes Baird Cancer, PA-C  tamsulosin (FLOMAX) 0.4 MG CAPS capsule Take 1 capsule (0.4 mg total) by mouth daily. 10/13/16  Yes Raylene Everts, MD  amoxicillin  (AMOXIL) 500 MG capsule Take 1 capsule (500 mg total) by mouth 3 (three) times daily. 12/08/16   Forde Dandy, MD  azithromycin (ZITHROMAX) 250 MG tablet Take 1 tablet (250 mg total) by mouth daily. Take first 2 tablets together, then 1 every day until finished. 12/08/16   Forde Dandy, MD    Family History Family History  Problem Relation Age of Onset  . Diabetes Mother   . Renal Disease Mother     failure  . Diabetes Father     'old age'  . Hypertension Sister   . Hypertension Brother   . Diabetes Brother   . Diabetes Sister   . Diabetes Sister   . Diabetes Brother   . Hypertension Brother   . Diabetes Brother     right BKA  . Kidney failure Brother     on dialysis  . Kidney failure Brother     on dialysis  . Diabetes Brother     bilateral BKA  . Colon polyps Neg Hx   . Colon cancer Neg Hx   . Pancreatic disease Neg Hx     Social History Social History  Substance Use Topics  . Smoking status: Never Smoker  . Smokeless tobacco: Never Used     Comment: Quit x 10 years/ never smoked on regular basis  . Alcohol use 1.2 oz/week    2 Shots of liquor per week     Comment: drinks on weekends, gin/vodka 1/5th.     Allergies   Aspirin   Review of Systems Review of Systems  Constitutional: Negative for chills and fever.  HENT: Negative for congestion and rhinorrhea.   Respiratory: Positive for cough and hemoptysis. Negative for shortness of breath.   Cardiovascular: Positive for chest pain. Negative for leg swelling.  Gastrointestinal: Negative for blood in stool and vomiting.  Hematological: Does not bruise/bleed easily.  All other systems reviewed and are negative.    Physical Exam Updated Vital Signs BP 125/87 (BP Location: Right Arm)   Pulse 99   Temp 98.6 F (37 C) (Oral)   Resp 20   Ht 6' 0.5" (1.842 m)   Wt 262 lb (118.8 kg)   SpO2 95%   BMI 35.05 kg/m   Physical Exam Physical Exam  Nursing note and vitals reviewed. Constitutional: Well  developed, well nourished, non-toxic, and in no acute distress Head: Normocephalic and atraumatic.  Mouth/Throat: Oropharynx is clear and moist.  Neck: Normal range of motion. Neck supple.  Cardiovascular: Normal rate and regular rhythm.   Pulmonary/Chest: Effort normal and breath sounds normal.  Abdominal: Soft. There is no tenderness. There is  no rebound and no guarding.  Musculoskeletal: Normal range of motion. No Edema. No calf tenderness. Neurological: Alert, no facial droop, fluent speech, moves all extremities symmetrically Skin: Skin is warm and dry.  Psychiatric: Cooperative   ED Treatments / Results  DIAGNOSTIC STUDIES: Oxygen Saturation is 95% on RA, normal by my interpretation.    COORDINATION OF CARE: 1:02 PM Discussed treatment plan with pt at bedside and pt agreed to plan. Will Rx abx. Pt prepared for d/c,advised of symptomatic care at home, follow up precautions and return precautions.   Labs (all labs ordered are listed, but only abnormal results are displayed) Labs Reviewed  CBC WITH DIFFERENTIAL/PLATELET - Abnormal; Notable for the following:       Result Value   Platelets 141 (*)    All other components within normal limits  BASIC METABOLIC PANEL - Abnormal; Notable for the following:    Chloride 98 (*)    Glucose, Bld 115 (*)    Creatinine, Ser 1.46 (*)    GFR calc non Af Amer 48 (*)    GFR calc Af Amer 56 (*)    All other components within normal limits    EKG  EKG Interpretation None       Radiology Dg Chest 2 View  Result Date: 12/08/2016 CLINICAL DATA:  Hemoptysis since yesterday. History of hypertension, chronic gastritis, carcinoid tumor of the stomach EXAM: CHEST  2 VIEW COMPARISON:  Chest x-ray of September 08, 2012 FINDINGS: There is new increased density in the right middle lobe anteriorly. The remainder the right lung is clear. The left lung is clear. A stable nodule versus nipple shadow is noted on the left. There is no pleural effusion. The  heart and pulmonary vascularity are normal. The bony thorax exhibits no acute abnormality. There is multilevel degenerative disc disease of the thoracic spine. IMPRESSION: Chronic bronchitic changes, stable. New right middle lobe atelectasis or infiltrate. Followup PA and lateral chest X-ray is recommended in 3-4 weeks following trial of antibiotic therapy to ensure resolution and exclude underlying malignancy. Electronically Signed   By: David  Martinique M.D.   On: 12/08/2016 10:56    Procedures Procedures (including critical care time)  Medications Ordered in ED Medications - No data to display   Initial Impression / Assessment and Plan / ED Course  I have reviewed the triage vital signs and the nursing notes.  Pertinent labs & imaging results that were available during my care of the patient were reviewed by me and considered in my medical decision making (see chart for details).     66 year old male who presents with cough with blood tinged sputum starting last night. Amount of hemoptysis seems very mild. He is well-appearing, in no acute distress. Breathing comfortably on room air with normal oxygenation and otherwise stable vital signs. Chest x-ray visualized and shows evidence of potential right lower lobe infiltrate that could be suggestive of pneumonia. No systemic signs or symptoms of severe illness. Blood work unremarkable with no leukocytosis and stable hemoglobin. I will treat as a community-acquired pneumonia as outpatient. At this time, clinical suspicion low for PE, mass or any other serious intrathoracic etiologies.   Strict return and follow-up instructions reviewed. He expressed understanding of all discharge instructions and felt comfortable with the plan of care.   Final Clinical Impressions(s) / ED Diagnoses   Final diagnoses:  Lobar pneumonia (HCC)  Blood-tinged sputum    New Prescriptions New Prescriptions   AMOXICILLIN (AMOXIL) 500 MG CAPSULE  Take 1 capsule  (500 mg total) by mouth 3 (three) times daily.   AZITHROMYCIN (ZITHROMAX) 250 MG TABLET    Take 1 tablet (250 mg total) by mouth daily. Take first 2 tablets together, then 1 every day until finished.   I personally performed the services described in this documentation, which was scribed in my presence. The recorded information has been reviewed and is accurate.    Forde Dandy, MD 12/08/16 [id number]

## 2016-12-08 NOTE — ED Notes (Signed)
Patient given discharge instruction, verbalized understand. Patient ambulatory out of the department.  

## 2016-12-08 NOTE — ED Triage Notes (Signed)
1158am. Pt c/o cough x 5 days. Coughing up blood since yesterday. nad. Color wnl. Pt also c/o abd pain. Denies n/v/d.

## 2016-12-08 NOTE — Telephone Encounter (Signed)
ok 

## 2016-12-30 ENCOUNTER — Encounter: Payer: Self-pay | Admitting: Gastroenterology

## 2017-01-05 ENCOUNTER — Other Ambulatory Visit: Payer: Self-pay | Admitting: Orthopedic Surgery

## 2017-01-05 ENCOUNTER — Telehealth: Payer: Self-pay | Admitting: Orthopedic Surgery

## 2017-01-05 MED ORDER — HYDROCODONE-ACETAMINOPHEN 5-325 MG PO TABS: 1.0000 | ORAL_TABLET | Freq: Four times a day (QID) | ORAL | 0 refills | Status: DC | PRN

## 2017-01-05 NOTE — Telephone Encounter (Signed)
Hydrocodone-Acetaminophen 5/325 mg  Qty 30 Tablets     Take 1 tablet by mouth every 6 (six) hours as needed for moderate pain.

## 2017-01-05 NOTE — Progress Notes (Signed)
[place] controlled substance reporting system reviewed  

## 2017-01-05 NOTE — Telephone Encounter (Signed)
Routing to Dr Harrison for approval 

## 2017-01-20 ENCOUNTER — Encounter: Payer: Self-pay | Admitting: Family Medicine

## 2017-01-20 ENCOUNTER — Ambulatory Visit (INDEPENDENT_AMBULATORY_CARE_PROVIDER_SITE_OTHER): Payer: Medicare Other | Admitting: Family Medicine

## 2017-01-20 VITALS — BP 110/80 | HR 76 | Temp 98.1°F | Resp 16 | Ht 73.0 in | Wt 258.1 lb

## 2017-01-20 DIAGNOSIS — M1710 Unilateral primary osteoarthritis, unspecified knee: Secondary | ICD-10-CM

## 2017-01-20 DIAGNOSIS — K219 Gastro-esophageal reflux disease without esophagitis: Secondary | ICD-10-CM | POA: Diagnosis not present

## 2017-01-20 DIAGNOSIS — E7849 Other hyperlipidemia: Secondary | ICD-10-CM

## 2017-01-20 DIAGNOSIS — I10 Essential (primary) hypertension: Secondary | ICD-10-CM | POA: Diagnosis not present

## 2017-01-20 DIAGNOSIS — E784 Other hyperlipidemia: Secondary | ICD-10-CM

## 2017-01-20 DIAGNOSIS — IMO0002 Reserved for concepts with insufficient information to code with codable children: Secondary | ICD-10-CM

## 2017-01-20 LAB — CBC
HCT: 41 % (ref 38.5–50.0)
HEMOGLOBIN: 13.3 g/dL (ref 13.2–17.1)
MCH: 29.4 pg (ref 27.0–33.0)
MCHC: 32.4 g/dL (ref 32.0–36.0)
MCV: 90.5 fL (ref 80.0–100.0)
MPV: 11 fL (ref 7.5–12.5)
PLATELETS: 144 10*3/uL (ref 140–400)
RBC: 4.53 MIL/uL (ref 4.20–5.80)
RDW: 15.3 % — AB (ref 11.0–15.0)
WBC: 3.7 10*3/uL — ABNORMAL LOW (ref 3.8–10.8)

## 2017-01-20 MED ORDER — IBUPROFEN 800 MG PO TABS: ORAL_TABLET | ORAL | 3 refills | Status: DC

## 2017-01-20 NOTE — Progress Notes (Signed)
Chief Complaint  Patient presents with  . Follow-up    3 month/ lab rev  patient is doing well Here with [facility] ( in lobby) Taking prescribed medicines BP well controlled Needs labs today No heartburn on the prilosec Takes flomax for LUTS/ BPH, and urinates normally throughout day, bets up 4-5 X a night.  Normal pattern - unchanged Wants a refill of ibuprofen Is reminded to take only one a day due to kidney tox    Patient Active Problem List   Diagnosis Date Noted  . Hyperlipidemia 10/23/2016  . Mild intellectual disabilities (CODE) 09/29/2016  . Personal history of gout 09/22/2016  . Literacy level of illiterate 09/22/2016  . History of bilateral knee replacement 09/22/2016  . Carcinoid tumor determined by biopsy of stomach 04/10/2016  . Tubulovillous adenoma of large intestine 09/12/2015  . Neuroendocrine tumor 09/25/2014  . Elevated LFTs 07/17/2014  . Iron deficiency anemia 07/29/2010  . Fatty liver 07/29/2010  . ABUSE, ALCOHOL, UNSPECIFIED 11/30/2008  . Osteoarthrosis involving lower leg 08/20/2008  . Essential hypertension 10/20/2006  . B12 deficiency 08/15/2006  . GERD 08/15/2006  . IBS 08/15/2006  . HYPRTRPHY PROSTATE BNG W/O URINARY OBST/LUTS 08/15/2006  . OSTEOARTHRITIS 08/15/2006  . DEGENERATION, DISC NOS 08/15/2006    Outpatient Encounter Prescriptions as of 01/20/2017  Medication Sig  . acetaminophen (TYLENOL) 500 MG tablet Take 500 mg by mouth every 6 (six) hours as needed for moderate pain.  Marland Kitchen amLODipine (NORVASC) 10 MG tablet Take 1 tablet (10 mg total) by mouth daily.  Marland Kitchen atorvastatin (LIPITOR) 20 MG tablet Take 1 tablet (20 mg total) by mouth daily.  Marland Kitchen gabapentin (NEURONTIN) 300 MG capsule Take 300 mg by mouth 2 (two) times daily.   Marland Kitchen HYDROcodone-acetaminophen (NORCO/VICODIN) 5-325 MG tablet Take 1 tablet by mouth every 6 (six) hours as needed for moderate pain.  Marland Kitchen ibuprofen (ADVIL,MOTRIN) 800 MG tablet Take for knee pain.  May take one a day.  Take  with food  . levothyroxine (SYNTHROID) 200 MCG tablet Take 1 tablet (200 mcg total) by mouth daily before breakfast.  . metoprolol succinate (TOPROL-XL) 25 MG 24 hr tablet Take 1 tablet (25 mg total) by mouth daily.  Marland Kitchen omeprazole (PRILOSEC) 20 MG capsule Take 1 capsule (20 mg total) by mouth daily.  . Polysacchar Iron-FA-B12 (FERREX 150 FORTE) 150-1-25 MG-MG-MCG CAPS Take 1 capsule by mouth daily.  . tamsulosin (FLOMAX) 0.4 MG CAPS capsule Take 1 capsule (0.4 mg total) by mouth daily.   No facility-administered encounter medications on file as of 01/20/2017.     Allergies  Allergen Reactions  . Aspirin     REACTION: Diverticular Bleed    Review of Systems  Constitutional: Negative for activity change, appetite change, fatigue and unexpected weight change.  HENT: Negative for dental problem and hearing loss.   Eyes: Negative.  Negative for visual disturbance.  Respiratory: Negative for cough and shortness of breath.   Cardiovascular: Negative for chest pain and palpitations.  Gastrointestinal: Negative for constipation and diarrhea.  Genitourinary: Negative for difficulty urinating and frequency.  Musculoskeletal: Positive for arthralgias, gait problem and joint swelling.  Skin: Negative.   Neurological: Negative for dizziness and headaches.  Psychiatric/Behavioral: Negative for dysphoric mood and sleep disturbance.       Slow responses - poor fund knowledge    BP 110/80 (BP Location: Right Arm, Patient Position: Sitting, Cuff Size: Large)   Pulse 76   Temp 98.1 F (36.7 C) (Temporal)   Resp 16  Ht 6\' 1"  (1.854 m)   Wt 258 lb 1.9 oz (117.1 kg)   SpO2 98%   BMI 34.05 kg/m   Physical Exam  ASSESSMENT/PLAN:  1. Essential hypertension controlled  2. Osteoarthrosis involving lower leg Pain management acetaminophen, occas ibuprofen, occas norco  3. Gastroesophageal reflux disease, esophagitis presence not specified Asymptomatic on omeprazole  4. Other  hyperlipidemia Compliant with meds.  Not on diet.   Needs lab check today   Patient Instructions  Blood pressure is good Weight is stable Continue same medicine Lab tests today See me in 3 months I have refilled the ibuprofen.  You may take one a day, if needed for knee arthritis pain.  This is a good medicine if the gout flares up.   Raylene Everts, MD

## 2017-01-20 NOTE — Patient Instructions (Signed)
Blood pressure is good Weight is stable Continue same medicine Lab tests today See me in 3 months I have refilled the ibuprofen.  You may take one a day, if needed for knee arthritis pain.  This is a good medicine if the gout flares up.

## 2017-01-21 ENCOUNTER — Telehealth: Payer: Self-pay

## 2017-01-21 LAB — COMPREHENSIVE METABOLIC PANEL
ALT: 15 U/L (ref 9–46)
AST: 26 U/L (ref 10–35)
Albumin: 3.8 g/dL (ref 3.6–5.1)
Alkaline Phosphatase: 65 U/L (ref 40–115)
BUN: 13 mg/dL (ref 7–25)
CHLORIDE: 104 mmol/L (ref 98–110)
CO2: 25 mmol/L (ref 20–31)
Calcium: 9 mg/dL (ref 8.6–10.3)
Creat: 1.28 mg/dL — ABNORMAL HIGH (ref 0.70–1.25)
GLUCOSE: 107 mg/dL — AB (ref 65–99)
POTASSIUM: 4.1 mmol/L (ref 3.5–5.3)
SODIUM: 140 mmol/L (ref 135–146)
Total Bilirubin: 0.5 mg/dL (ref 0.2–1.2)
Total Protein: 7.5 g/dL (ref 6.1–8.1)

## 2017-01-21 LAB — LIPID PANEL
CHOL/HDL RATIO: 5.2 ratio — AB (ref <5.0)
Cholesterol: 150 mg/dL (ref <200)
HDL: 29 mg/dL — AB (ref >40)
LDL CALC: 90 mg/dL (ref <100)
Triglycerides: 154 mg/dL — ABNORMAL HIGH (ref <150)
VLDL: 31 mg/dL — AB (ref <30)

## 2017-01-21 LAB — TSH: TSH: 2.81 m[IU]/L (ref 0.40–4.50)

## 2017-01-21 LAB — VITAMIN D 25 HYDROXY (VIT D DEFICIENCY, FRACTURES): Vit D, 25-Hydroxy: 14 ng/mL — ABNORMAL LOW (ref 30–100)

## 2017-01-22 ENCOUNTER — Encounter: Payer: Self-pay | Admitting: Family Medicine

## 2017-01-22 LAB — URINALYSIS, ROUTINE W REFLEX MICROSCOPIC
BILIRUBIN URINE: NEGATIVE
GLUCOSE, UA: NEGATIVE
Hgb urine dipstick: NEGATIVE
Ketones, ur: NEGATIVE
Nitrite: NEGATIVE
PROTEIN: NEGATIVE
Specific Gravity, Urine: 1.012 (ref 1.001–1.035)
pH: 6.5 (ref 5.0–8.0)

## 2017-01-22 LAB — URINALYSIS, MICROSCOPIC ONLY
Casts: NONE SEEN [LPF]
Crystals: NONE SEEN [HPF]
RBC / HPF: NONE SEEN RBC/HPF (ref <=2)
Yeast: NONE SEEN [HPF]

## 2017-01-22 MED ORDER — VITAMIN D (ERGOCALCIFEROL) 1.25 MG (50000 UNIT) PO CAPS: 50000.0000 [IU] | ORAL_CAPSULE | ORAL | 0 refills | Status: DC

## 2017-01-27 NOTE — Telephone Encounter (Signed)
Called pt with lab results

## 2017-02-01 ENCOUNTER — Encounter: Payer: Self-pay | Admitting: Orthopedic Surgery

## 2017-02-01 ENCOUNTER — Ambulatory Visit (INDEPENDENT_AMBULATORY_CARE_PROVIDER_SITE_OTHER): Payer: Medicare Other

## 2017-02-01 ENCOUNTER — Ambulatory Visit (INDEPENDENT_AMBULATORY_CARE_PROVIDER_SITE_OTHER): Payer: Medicare Other | Admitting: Orthopedic Surgery

## 2017-02-01 DIAGNOSIS — M1710 Unilateral primary osteoarthritis, unspecified knee: Secondary | ICD-10-CM

## 2017-02-01 DIAGNOSIS — G894 Chronic pain syndrome: Secondary | ICD-10-CM

## 2017-02-01 DIAGNOSIS — Z96653 Presence of artificial knee joint, bilateral: Secondary | ICD-10-CM | POA: Diagnosis not present

## 2017-02-01 MED ORDER — HYDROCODONE-ACETAMINOPHEN 5-325 MG PO TABS: 1.0000 | ORAL_TABLET | Freq: Four times a day (QID) | ORAL | 0 refills | Status: DC | PRN

## 2017-02-01 NOTE — Progress Notes (Signed)
ANNUAL FOLLOW UP FOR  both right and left TKA   Chief Complaint  Patient presents with  . Follow-up    bilateral TKA xrays, Rt 09/18/08, Lt 06/29/06     HPI: The patient is here for the annual  follow-up x-ray for knee replacement  The patient has had good function of his total knees. He is now 8 and 11 years out respectively.  Review of Systems  Constitutional: Negative for chills and fever.  Respiratory: Negative for shortness of breath.   Cardiovascular: Negative for chest pain.  Skin: Negative for itching and rash.   Past Medical History:  Diagnosis Date  . Anemia FeDA: GASTRIC POLYPS, B12   TCS 2008 EGD 2009, 2008-HB 11.1 MCV 83.6 CR 1.22, 2009 FERRITIN 102-22  . B12 deficiency   . Carcinoid tumor determined by biopsy of stomach   . Chronic knee pain   . Diverticulosis 2008 LGIB  . DIVERTICULOSIS, COLON 08/15/2006   Qualifier: Diagnosis of  By: Jonna Munro MD, Roderic Scarce    . GERD (gastroesophageal reflux disease)   . Gout   . Hepatomegaly 2o to FATTY LIVER DZ  . History of alcohol abuse   . History of septic arthritis   . HTN (hypertension)   . Hypothyroidism   . Neuroendocrine tumor 09/25/2014  . PUD 11/30/2008   Qualifier: History of  By: Jonna Munro MD, Roderic Scarce    . Substance abuse     Examination of the Right KNEE   Inspection shows : incision healed nicely without erythema, no tenderness no swelling  Range of motion total range of motion is 115  Stability the knee is stable anterior to posterior as well as medial to lateral  Strength quadriceps strength is normal  Skin no erythema around the skin incision  Cardiovascular mild EDEMA   Examination of the left knee The incision looks fine the knee has no swelling Flexion is 105 Stability check was normal Quadriceps strength was normal Cardiovascular exam showed minimal edema Medical decision-making section    X-rays ordered with the following personal interpretation  Normal alignment without  loosening on the right   Left knee x-ray shows varus alignment to the tibial spine plateau without loosening   Meds ordered this encounter  Medications  . HYDROcodone-acetaminophen (NORCO/VICODIN) 5-325 MG tablet    Sig: Take 1 tablet by mouth every 6 (six) hours as needed for moderate pain.    Dispense:  30 tablet    Refill:  0    Diagnosis  Encounter Diagnosis  Name Primary?  . Status post bilateral knee replacements Yes     Plan follow-up 1 year repeat x-rays

## 2017-02-10 ENCOUNTER — Other Ambulatory Visit: Payer: Self-pay | Admitting: Family Medicine

## 2017-02-11 ENCOUNTER — Telehealth: Payer: Self-pay | Admitting: Family Medicine

## 2017-02-11 ENCOUNTER — Other Ambulatory Visit: Payer: Self-pay

## 2017-02-11 MED ORDER — LEVOTHYROXINE SODIUM 200 MCG PO TABS: 200.0000 ug | ORAL_TABLET | Freq: Every day | ORAL | 0 refills | Status: DC

## 2017-02-11 NOTE — Telephone Encounter (Signed)
Refill sent.

## 2017-02-11 NOTE — Telephone Encounter (Signed)
Patient brought in medication bottle to request refill for levothyroxine   [facility]   Cb#: [phone]

## 2017-02-18 ENCOUNTER — Emergency Department (HOSPITAL_COMMUNITY)
Admission: EM | Admit: 2017-02-18 | Discharge: 2017-02-18 | Disposition: A | Discharge status: Home / Self Care | Payer: Medicare Other | Attending: Emergency Medicine | Admitting: Emergency Medicine

## 2017-02-18 ENCOUNTER — Emergency Department (HOSPITAL_COMMUNITY): Payer: Medicare Other

## 2017-02-18 ENCOUNTER — Encounter (HOSPITAL_COMMUNITY): Payer: Self-pay | Admitting: Emergency Medicine

## 2017-02-18 DIAGNOSIS — R109 Unspecified abdominal pain: Secondary | ICD-10-CM

## 2017-02-18 DIAGNOSIS — Z79899 Other long term (current) drug therapy: Secondary | ICD-10-CM | POA: Diagnosis not present

## 2017-02-18 DIAGNOSIS — N390 Urinary tract infection, site not specified: Secondary | ICD-10-CM | POA: Diagnosis not present

## 2017-02-18 DIAGNOSIS — I10 Essential (primary) hypertension: Secondary | ICD-10-CM | POA: Insufficient documentation

## 2017-02-18 DIAGNOSIS — E039 Hypothyroidism, unspecified: Secondary | ICD-10-CM | POA: Insufficient documentation

## 2017-02-18 DIAGNOSIS — Z96653 Presence of artificial knee joint, bilateral: Secondary | ICD-10-CM | POA: Diagnosis not present

## 2017-02-18 DIAGNOSIS — D002 Carcinoma in situ of stomach: Secondary | ICD-10-CM | POA: Diagnosis not present

## 2017-02-18 LAB — COMPREHENSIVE METABOLIC PANEL
ALBUMIN: 3.3 g/dL — AB (ref 3.5–5.0)
ALT: 13 U/L — ABNORMAL LOW (ref 17–63)
ANION GAP: 11 (ref 5–15)
AST: 19 U/L (ref 15–41)
Alkaline Phosphatase: 61 U/L (ref 38–126)
BUN: 15 mg/dL (ref 6–20)
CHLORIDE: 103 mmol/L (ref 101–111)
CO2: 25 mmol/L (ref 22–32)
Calcium: 8.7 mg/dL — ABNORMAL LOW (ref 8.9–10.3)
Creatinine, Ser: 1.19 mg/dL (ref 0.61–1.24)
GFR calc non Af Amer: >60 mL/min (ref >60)
Glucose, Bld: 108 mg/dL — ABNORMAL HIGH (ref 65–99)
Potassium: 3.4 mmol/L — ABNORMAL LOW (ref 3.5–5.1)
SODIUM: 139 mmol/L (ref 135–145)
Total Bilirubin: 0.9 mg/dL (ref 0.3–1.2)
Total Protein: 7.8 g/dL (ref 6.5–8.1)

## 2017-02-18 LAB — URINALYSIS, ROUTINE W REFLEX MICROSCOPIC
BILIRUBIN URINE: NEGATIVE
Glucose, UA: NEGATIVE mg/dL
KETONES UR: NEGATIVE mg/dL
NITRITE: NEGATIVE
PROTEIN: 100 mg/dL — AB
Specific Gravity, Urine: 1.024 (ref 1.005–1.030)
pH: 5 (ref 5.0–8.0)

## 2017-02-18 LAB — CBC WITH DIFFERENTIAL/PLATELET
BASOS PCT: 0 %
Basophils Absolute: 0 10*3/uL (ref 0.0–0.1)
EOS ABS: 0.1 10*3/uL (ref 0.0–0.7)
EOS PCT: 2 %
HCT: 39 % (ref 39.0–52.0)
Hemoglobin: 12.8 g/dL — ABNORMAL LOW (ref 13.0–17.0)
LYMPHS ABS: 1.6 10*3/uL (ref 0.7–4.0)
Lymphocytes Relative: 22 %
MCH: 30.1 pg (ref 26.0–34.0)
MCHC: 32.8 g/dL (ref 30.0–36.0)
MCV: 91.8 fL (ref 78.0–100.0)
MONOS PCT: 12 %
Monocytes Absolute: 0.8 10*3/uL (ref 0.1–1.0)
Neutro Abs: 4.7 10*3/uL (ref 1.7–7.7)
Neutrophils Relative %: 64 %
PLATELETS: 183 10*3/uL (ref 150–400)
RBC: 4.25 MIL/uL (ref 4.22–5.81)
RDW: 13.9 % (ref 11.5–15.5)
WBC: 7.2 10*3/uL (ref 4.0–10.5)

## 2017-02-18 LAB — LIPASE, BLOOD: Lipase: 30 U/L (ref 11–51)

## 2017-02-18 MED ORDER — CEPHALEXIN 500 MG PO CAPS: 500.0000 mg | ORAL_CAPSULE | Freq: Four times a day (QID) | ORAL | 0 refills | Status: DC

## 2017-02-18 MED ORDER — CEFTRIAXONE SODIUM 1 G IJ SOLR
1.0000 g | Freq: Once | INTRAMUSCULAR | Status: AC
  Administered 2017-02-18: 1 g via INTRAMUSCULAR
  Filled 2017-02-18: qty 10

## 2017-02-18 MED ORDER — STERILE WATER FOR INJECTION IJ SOLN
INTRAMUSCULAR | Status: AC
  Filled 2017-02-18: qty 10

## 2017-02-18 NOTE — ED Provider Notes (Signed)
[facility] DEPT Provider Note   CSN: [id number] Arrival date & time: 02/18/17  0711     History   Chief Complaint Chief Complaint  Patient presents with  . Abdominal Pain    HPI [name] is a 66 y.o. male.  Left lateral abdominal pain for 3 days. Decreased appetite. No fever, sweats, chills, vomiting, diarrhea, dysuria, hematuria. He has had a bowel movement recently. Severity of pain is mild to moderate. Nothing makes pain better or worse.      Past Medical History:  Diagnosis Date  . Anemia FeDA: GASTRIC POLYPS, B12   TCS 2008 EGD 2009, 2008-HB 11.1 MCV 83.6 CR 1.22, 2009 FERRITIN 102-22  . B12 deficiency   . Cancer ([place])   . Carcinoid tumor determined by biopsy of stomach   . Chronic knee pain   . Diverticulosis 2008 LGIB  . DIVERTICULOSIS, COLON 08/15/2006   Qualifier: Diagnosis of  By: Jonna Munro MD, Roderic Scarce    . GERD (gastroesophageal reflux disease)   . Gout   . Hepatomegaly 2o to FATTY LIVER DZ  . History of alcohol abuse   . History of septic arthritis   . HTN (hypertension)   . Hypothyroidism   . Neuroendocrine tumor 09/25/2014  . PUD 11/30/2008   Qualifier: History of  By: Jonna Munro MD, Roderic Scarce    . Substance abuse     Patient Active Problem List   Diagnosis Date Noted  . Hyperlipidemia 10/23/2016  . Mild intellectual disabilities (CODE) 09/29/2016  . Personal history of gout 09/22/2016  . Literacy level of illiterate 09/22/2016  . History of bilateral knee replacement 09/22/2016  . Carcinoid tumor determined by biopsy of stomach 04/10/2016  . Tubulovillous adenoma of large intestine 09/12/2015  . Neuroendocrine tumor 09/25/2014  . Elevated LFTs 07/17/2014  . Iron deficiency anemia 07/29/2010  . Fatty liver 07/29/2010  . ABUSE, ALCOHOL, UNSPECIFIED 11/30/2008  . Osteoarthrosis involving lower leg 08/20/2008  . Essential hypertension 10/20/2006  . B12 deficiency 08/15/2006  . GERD 08/15/2006  . IBS 08/15/2006  . HYPRTRPHY PROSTATE BNG  W/O URINARY OBST/LUTS 08/15/2006  . OSTEOARTHRITIS 08/15/2006  . DEGENERATION, DISC NOS 08/15/2006    Past Surgical History:  Procedure Laterality Date  . BIOPSY  08/14/2014   Procedure: BIOPSY;  Surgeon: Danie Binder, MD;  Location: [facility];  Service: Endoscopy;;  . BIOPSY  03/12/2015   Procedure: BIOPSY;  Surgeon: Danie Binder, MD;  Location: [facility];  Service: Endoscopy;;  . BIOPSY  04/21/2016   Procedure: BIOPSY;  Surgeon: Danie Binder, MD;  [facility];  Service: Endoscopy;;  bx's of antrum, body of stomach, fundus, and cardia  . CHOLECYSTECTOMY    . COLONOSCOPY  2008 [facility] DJ   LGIB 2o to TICS, prep good  . COLONOSCOPY WITH PROPOFOL N/A 08/14/2014   SLF: 1. Four large colon polyps removed. 2. The left colon is redundant 3. Moderate diverticulosis throughout teh entire examined colon  . COLONOSCOPY WITH PROPOFOL N/A 11/06/2014   SLF: 8 small polyps removed. One large pedunculated polyp removed from the ascending colon, tubulovillous and tubular adenomas. Next colonoscopy March 2019  . ESOPHAGOGASTRODUODENOSCOPY  12/08/2007   FKC:LEXN gastric polyps seen in the cardia and body of the stomach/Normal esophagus without evidence of Barrett's, mass, erosion/Normal duodenal bulb and second portion of the duodenum. Benign bx.  . ESOPHAGOGASTRODUODENOSCOPY (EGD) WITH PROPOFOL N/A 08/14/2014   SLF: 1. Heme postive stools due to gastric and colon polyps 2. Multiple gastric   . ESOPHAGOGASTRODUODENOSCOPY (EGD)  WITH PROPOFOL N/A 03/12/2015   SLF: Multiple gastric nodules seen in gasric body/antrum. 2. Non-erosive gastritis ( inflammation) was found in the gastric antrum.   . ESOPHAGOGASTRODUODENOSCOPY (EGD) WITH PROPOFOL N/A 10/08/2015   Procedure: ESOPHAGOGASTRODUODENOSCOPY (EGD) WITH PROPOFOL;  Surgeon: Danie Binder, MD;  [facility];  Service: Endoscopy;  Laterality: N/A;  0945  . ESOPHAGOGASTRODUODENOSCOPY (EGD) WITH PROPOFOL N/A 04/21/2016   Procedure:  ESOPHAGOGASTRODUODENOSCOPY (EGD) WITH PROPOFOL;  Surgeon: Danie Binder, MD;  [facility];  Service: Endoscopy;  Laterality: N/A;  730   . HAND SURGERY    . JOINT REPLACEMENT    . POLYPECTOMY  08/14/2014   Procedure: POLYPECTOMY;  Surgeon: Danie Binder, MD;  Location: [facility];  Service: Endoscopy;;  . POLYPECTOMY N/A 11/06/2014   Procedure: POLYPECTOMY;  Surgeon: Danie Binder, MD;  Location: [facility];  Service: Endoscopy;  Laterality: N/A;  Transverse Colon x3, Ascending Colon x2, Descending Colon x3  . REPLACEMENT TOTAL KNEE BILATERAL    . UPPER GASTROINTESTINAL ENDOSCOPY  APR 2009   INFLAMED HYPERPLASTIC POLYPS, CHRONIC GASTRITIS       Home Medications    Prior to Admission medications   Medication Sig Start Date End Date Taking? Authorizing Provider  acetaminophen (TYLENOL) 500 MG tablet Take 500 mg by mouth every 6 (six) hours as needed for moderate pain.   Yes [provider]  amLODipine (NORVASC) 10 MG tablet Take 1 tablet (10 mg total) by mouth daily. 09/22/16  Yes Raylene Everts, MD  atorvastatin (LIPITOR) 20 MG tablet Take 1 tablet (20 mg total) by mouth daily. 10/23/16  Yes Raylene Everts, MD  febuxostat (ULORIC) 40 MG tablet Take 40 mg by mouth daily.   Yes [provider]  gabapentin (NEURONTIN) 300 MG capsule Take 300 mg by mouth 2 (two) times daily.  09/22/16  Yes [provider]  HYDROcodone-acetaminophen (NORCO/VICODIN) 5-325 MG tablet Take 1 tablet by mouth every 6 (six) hours as needed for moderate pain. 02/01/17  Yes Carole Civil, MD  ibuprofen (ADVIL,MOTRIN) 800 MG tablet Take for knee pain.  May take one a day.  Take with food 01/20/17  Yes Raylene Everts, MD  levothyroxine (SYNTHROID) 200 MCG tablet Take 1 tablet (200 mcg total) by mouth daily before breakfast. 02/11/17  Yes Raylene Everts, MD  metoprolol succinate (TOPROL-XL) 25 MG 24 hr tablet Take 1 tablet (25 mg total) by mouth daily. 09/22/16  Yes Raylene Everts, MD  omeprazole (PRILOSEC) 20 MG capsule Take 1 capsule (20 mg total) by mouth daily. 09/30/16  Yes Fields, Marga Melnick, MD  Polysacchar Iron-FA-B12 (FERREX 150 FORTE) 150-1-25 MG-MG-MCG CAPS Take 1 capsule by mouth daily. 11/26/16  Yes Baird Cancer, PA-C  tamsulosin (FLOMAX) 0.4 MG CAPS capsule Take 1 capsule (0.4 mg total) by mouth daily. 10/13/16  Yes Raylene Everts, MD  cephALEXin (KEFLEX) 500 MG capsule Take 1 capsule (500 mg total) by mouth 4 (four) times daily. 02/18/17   Nat Christen, MD  Vitamin D, Ergocalciferol, (DRISDOL) 50000 units CAPS capsule Take 1 capsule (50,000 Units total) by mouth every 7 (seven) days. Patient not taking: Reported on 02/18/2017 01/22/17   Raylene Everts, MD    Family History Family History  Problem Relation Age of Onset  . Diabetes Mother   . Renal Disease Mother        failure  . Diabetes Father        'old age'  . Hypertension  Sister   . Hypertension Brother   . Diabetes Brother   . Diabetes Sister   . Diabetes Sister   . Diabetes Brother   . Hypertension Brother   . Diabetes Brother        right BKA  . Kidney failure Brother        on dialysis  . Kidney failure Brother        on dialysis  . Diabetes Brother        bilateral BKA  . Colon polyps Neg Hx   . Colon cancer Neg Hx   . Pancreatic disease Neg Hx     Social History Social History  Substance Use Topics  . Smoking status: Never Smoker  . Smokeless tobacco: Never Used     Comment: Quit x 10 years/ never smoked on regular basis  . Alcohol use 1.2 oz/week    2 Shots of liquor per week     Comment: drinks on weekends, gin/vodka 1/5th.     Allergies   Aspirin   Review of Systems Review of Systems  All other systems reviewed and are negative.    Physical Exam Updated Vital Signs BP 135/88   Pulse 94   Temp 98.4 F (36.9 C) (Oral)   Resp 16   Ht 6' (1.829 m)   Wt 117.9 kg (260 lb)   SpO2 96%   BMI 35.26 kg/m   Physical Exam  Constitutional: He  is oriented to person, place, and time. He appears well-developed and well-nourished.  HENT:  Head: Normocephalic and atraumatic.  Eyes: Conjunctivae are normal.  Neck: Neck supple.  Cardiovascular: Normal rate and regular rhythm.   Pulmonary/Chest: Effort normal and breath sounds normal.  Abdominal:  Left mid anterior lateral abdominal tenderness  Musculoskeletal: Normal range of motion.  Neurological: He is alert and oriented to person, place, and time.  Skin: Skin is warm and dry.  Psychiatric: He has a normal mood and affect. His behavior is normal.  Nursing note and vitals reviewed.    ED Treatments / Results  Labs (all labs ordered are listed, but only abnormal results are displayed) Labs Reviewed  CBC WITH DIFFERENTIAL/PLATELET - Abnormal; Notable for the following:       Result Value   Hemoglobin 12.8 (*)    All other components within normal limits  COMPREHENSIVE METABOLIC PANEL - Abnormal; Notable for the following:    Potassium 3.4 (*)    Glucose, Bld 108 (*)    Calcium 8.7 (*)    Albumin 3.3 (*)    ALT 13 (*)    All other components within normal limits  URINALYSIS, ROUTINE W REFLEX MICROSCOPIC - Abnormal; Notable for the following:    Color, Urine AMBER (*)    APPearance CLOUDY (*)    Hgb urine dipstick SMALL (*)    Protein, ur 100 (*)    Leukocytes, UA LARGE (*)    Bacteria, UA MANY (*)    Squamous Epithelial / LPF 0-5 (*)    All other components within normal limits  URINE CULTURE  LIPASE, BLOOD    EKG  EKG Interpretation None       Radiology Dg Abdomen Acute W/chest  Result Date: 02/18/2017 CLINICAL DATA:  Left-sided abdominal and flank pain for the past 3 days without associated symptoms. History of previous cholecystectomy, diverticulitis, and carcinoid tumor of the stomach EXAM: DG ABDOMEN ACUTE W/ 1V CHEST COMPARISON:  Chest x-ray of December 08, 2016 an abdominal and pelvic CT scan of  September 11, 2014. FINDINGS: The lungs are well-expanded.  There is no focal infiltrate. There is no pleural effusion. The heart is top-normal in size. The pulmonary vascularity is normal. The mediastinum is normal in width. There is calcification in the wall of the aortic arch. Within the abdomen the bowel gas pattern is normal. No abnormal soft tissue calcifications are observed. There surgical clips in the gallbladder fossa and in the left upper quadrant. The bony structures exhibit no acute abnormalities. There are degenerative disc changes in the lower lumbar spine. IMPRESSION: There is no acute cardiopulmonary abnormality. No acute intra-abdominal abnormality is observed either. Further evaluation with abdominal and pelvic CT scanning may be a useful next imaging step. Electronically Signed   By: David  Martinique M.D.   On: 02/18/2017 08:28    Procedures Procedures (including critical care time)  Medications Ordered in ED Medications  sterile water (preservative free) injection (not administered)  cefTRIAXone (ROCEPHIN) injection 1 g (1 g Intramuscular Given 02/18/17 1251)     Initial Impression / Assessment and Plan / ED Course  I have reviewed the triage vital signs and the nursing notes.  Pertinent labs & imaging results that were available during my care of the patient were reviewed by me and considered in my medical decision making (see chart for details).     Patient is nontoxic-appearing. No acute abdomen. Urinalysis shows evidence of infection. Rx Rocephin 1 g IM and discharge medications Keflex 500 mg. Urine culture pending.  These findings were discussed with the patient.  Final Clinical Impressions(s) / ED Diagnoses   Final diagnoses:  Abdominal pain, unspecified abdominal location  Urinary tract infection without hematuria, site unspecified    New Prescriptions New Prescriptions   CEPHALEXIN (KEFLEX) 500 MG CAPSULE    Take 1 capsule (500 mg total) by mouth 4 (four) times daily.     Nat Christen, MD 02/18/17 1310

## 2017-02-18 NOTE — ED Notes (Signed)
Patient transported to X-ray 

## 2017-02-18 NOTE — ED Notes (Signed)
Pt is still not able to give urine sample

## 2017-02-18 NOTE — ED Triage Notes (Signed)
Pt reports left upper and lower quad pain for 3 days with loss of appetite.  Denies vomiting or diarrhea.  Pt had to go to bathroom for bowel movement on arrival to ED.

## 2017-02-18 NOTE — Discharge Instructions (Addendum)
You have a urinary tract infection. You were given an antibiotic injection. Prescription for antibiotic pills for 1 week. Increase fluids. Follow-up your primary care doctor or return if worse.

## 2017-02-18 NOTE — ED Notes (Signed)
Dr Lacinda Axon notified that pt has been unable to give a urine specimen. PO fluids given

## 2017-02-18 NOTE — ED Notes (Signed)
Pt back from x-ray.

## 2017-02-18 NOTE — ED Notes (Signed)
Went to see if pt was able to give urine sample after drinking a cup of water. Pt denied. Gave patient another cup of water and will check back.

## 2017-02-19 ENCOUNTER — Encounter (HOSPITAL_COMMUNITY): Payer: Self-pay | Admitting: Emergency Medicine

## 2017-02-19 ENCOUNTER — Emergency Department (HOSPITAL_COMMUNITY): Payer: Medicare Other

## 2017-02-19 ENCOUNTER — Emergency Department (HOSPITAL_COMMUNITY)
Admission: EM | Admit: 2017-02-19 | Discharge: 2017-02-19 | Disposition: A | Discharge status: Home / Self Care | Payer: Medicare Other | Attending: Emergency Medicine | Admitting: Emergency Medicine

## 2017-02-19 DIAGNOSIS — M25532 Pain in left wrist: Secondary | ICD-10-CM | POA: Diagnosis present

## 2017-02-19 DIAGNOSIS — I10 Essential (primary) hypertension: Secondary | ICD-10-CM | POA: Diagnosis not present

## 2017-02-19 DIAGNOSIS — Z79899 Other long term (current) drug therapy: Secondary | ICD-10-CM | POA: Insufficient documentation

## 2017-02-19 DIAGNOSIS — E039 Hypothyroidism, unspecified: Secondary | ICD-10-CM | POA: Diagnosis not present

## 2017-02-19 DIAGNOSIS — M10032 Idiopathic gout, left wrist: Secondary | ICD-10-CM | POA: Diagnosis not present

## 2017-02-19 DIAGNOSIS — Z791 Long term (current) use of non-steroidal anti-inflammatories (NSAID): Secondary | ICD-10-CM | POA: Diagnosis not present

## 2017-02-19 DIAGNOSIS — M7989 Other specified soft tissue disorders: Secondary | ICD-10-CM | POA: Diagnosis not present

## 2017-02-19 MED ORDER — HYDROCODONE-ACETAMINOPHEN 5-325 MG PO TABS: 1.0000 | ORAL_TABLET | ORAL | 0 refills | Status: DC | PRN

## 2017-02-19 MED ORDER — PREDNISONE 10 MG PO TABS: ORAL_TABLET | ORAL | 0 refills | Status: DC

## 2017-02-19 MED ORDER — HYDROCODONE-ACETAMINOPHEN 5-325 MG PO TABS
1.0000 | ORAL_TABLET | Freq: Once | ORAL | Status: AC
  Administered 2017-02-19: 1 via ORAL
  Filled 2017-02-19: qty 1

## 2017-02-19 MED ORDER — PREDNISONE 50 MG PO TABS
60.0000 mg | ORAL_TABLET | Freq: Once | ORAL | Status: AC
  Administered 2017-02-19: 60 mg via ORAL
  Filled 2017-02-19: qty 1

## 2017-02-19 NOTE — ED Triage Notes (Signed)
Pt reports left wrist and forearm pain with swelling- He denies any injury Reports he has a history of arthritis but not gout  Dr Meda Coffee is his PCP

## 2017-02-19 NOTE — ED Provider Notes (Signed)
[facility] DEPT Provider Note   CSN: [id number] Arrival date & time: 02/19/17  2056     History   Chief Complaint Chief Complaint  Patient presents with  . Hand Pain    HPI [name] is a 66 y.o. male presenting with left hand and wrist pain and swelling which started one week ago but worsened today and is consistent with history of gout.  He takes uloric daily and has occasional flares including feet, knee and wrists.  He denies injury or falls. He was seen here yesterday for abdominal pain which is improving, is currently on keflex for uti.  He has found no alleviators for his pain which is constant, aching with radiation into his forearm.  The history is provided by the patient.    Past Medical History:  Diagnosis Date  . Anemia FeDA: GASTRIC POLYPS, B12   TCS 2008 EGD 2009, 2008-HB 11.1 MCV 83.6 CR 1.22, 2009 FERRITIN 102-22  . B12 deficiency   . Cancer ([place])   . Carcinoid tumor determined by biopsy of stomach   . Chronic knee pain   . Diverticulosis 2008 LGIB  . DIVERTICULOSIS, COLON 08/15/2006   Qualifier: Diagnosis of  By: Jonna Munro MD, Roderic Scarce    . GERD (gastroesophageal reflux disease)   . Gout   . Hepatomegaly 2o to FATTY LIVER DZ  . History of alcohol abuse   . History of septic arthritis   . HTN (hypertension)   . Hypothyroidism   . Neuroendocrine tumor 09/25/2014  . PUD 11/30/2008   Qualifier: History of  By: Jonna Munro MD, Roderic Scarce    . Substance abuse     Patient Active Problem List   Diagnosis Date Noted  . Hyperlipidemia 10/23/2016  . Mild intellectual disabilities (CODE) 09/29/2016  . Personal history of gout 09/22/2016  . Literacy level of illiterate 09/22/2016  . History of bilateral knee replacement 09/22/2016  . Carcinoid tumor determined by biopsy of stomach 04/10/2016  . Tubulovillous adenoma of large intestine 09/12/2015  . Neuroendocrine tumor 09/25/2014  . Elevated LFTs 07/17/2014  . Iron deficiency anemia 07/29/2010  . Fatty  liver 07/29/2010  . ABUSE, ALCOHOL, UNSPECIFIED 11/30/2008  . Osteoarthrosis involving lower leg 08/20/2008  . Essential hypertension 10/20/2006  . B12 deficiency 08/15/2006  . GERD 08/15/2006  . IBS 08/15/2006  . HYPRTRPHY PROSTATE BNG W/O URINARY OBST/LUTS 08/15/2006  . OSTEOARTHRITIS 08/15/2006  . DEGENERATION, DISC NOS 08/15/2006    Past Surgical History:  Procedure Laterality Date  . BIOPSY  08/14/2014   Procedure: BIOPSY;  Surgeon: Danie Binder, MD;  Location: [facility];  Service: Endoscopy;;  . BIOPSY  03/12/2015   Procedure: BIOPSY;  Surgeon: Danie Binder, MD;  Location: [facility];  Service: Endoscopy;;  . BIOPSY  04/21/2016   Procedure: BIOPSY;  Surgeon: Danie Binder, MD;  [facility];  Service: Endoscopy;;  bx's of antrum, body of stomach, fundus, and cardia  . CHOLECYSTECTOMY    . COLONOSCOPY  2008 [facility] DJ   LGIB 2o to TICS, prep good  . COLONOSCOPY WITH PROPOFOL N/A 08/14/2014   SLF: 1. Four large colon polyps removed. 2. The left colon is redundant 3. Moderate diverticulosis throughout teh entire examined colon  . COLONOSCOPY WITH PROPOFOL N/A 11/06/2014   SLF: 8 small polyps removed. One large pedunculated polyp removed from the ascending colon, tubulovillous and tubular adenomas. Next colonoscopy March 2019  . ESOPHAGOGASTRODUODENOSCOPY  12/08/2007   KCL:EXNT gastric polyps seen in the cardia and body of the  stomach/Normal esophagus without evidence of Barrett's, mass, erosion/Normal duodenal bulb and second portion of the duodenum. Benign bx.  . ESOPHAGOGASTRODUODENOSCOPY (EGD) WITH PROPOFOL N/A 08/14/2014   SLF: 1. Heme postive stools due to gastric and colon polyps 2. Multiple gastric   . ESOPHAGOGASTRODUODENOSCOPY (EGD) WITH PROPOFOL N/A 03/12/2015   SLF: Multiple gastric nodules seen in gasric body/antrum. 2. Non-erosive gastritis ( inflammation) was found in the gastric antrum.   . ESOPHAGOGASTRODUODENOSCOPY (EGD) WITH PROPOFOL N/A 10/08/2015   Procedure:  ESOPHAGOGASTRODUODENOSCOPY (EGD) WITH PROPOFOL;  Surgeon: Danie Binder, MD;  [facility];  Service: Endoscopy;  Laterality: N/A;  0945  . ESOPHAGOGASTRODUODENOSCOPY (EGD) WITH PROPOFOL N/A 04/21/2016   Procedure: ESOPHAGOGASTRODUODENOSCOPY (EGD) WITH PROPOFOL;  Surgeon: Danie Binder, MD;  [facility];  Service: Endoscopy;  Laterality: N/A;  730   . HAND SURGERY    . JOINT REPLACEMENT    . POLYPECTOMY  08/14/2014   Procedure: POLYPECTOMY;  Surgeon: Danie Binder, MD;  Location[facility];  Service: Endoscopy;;  . POLYPECTOMY N/A 11/06/2014   Procedure: POLYPECTOMY;  Surgeon: Danie Binder, MD;  Location: [facility];  Service: Endoscopy;  Laterality: N/A;  Transverse Colon x3, Ascending Colon x2, Descending Colon x3  . REPLACEMENT TOTAL KNEE BILATERAL    . UPPER GASTROINTESTINAL ENDOSCOPY  APR 2009   INFLAMED HYPERPLASTIC POLYPS, CHRONIC GASTRITIS       Home Medications    Prior to Admission medications   Medication Sig Start Date End Date Taking? Authorizing Provider  acetaminophen (TYLENOL) 500 MG tablet Take 500 mg by mouth every 6 (six) hours as needed for moderate pain.   Yes [provider]  amLODipine (NORVASC) 10 MG tablet Take 1 tablet (10 mg total) by mouth daily. 09/22/16  Yes Raylene Everts, MD  atorvastatin (LIPITOR) 20 MG tablet Take 1 tablet (20 mg total) by mouth daily. 10/23/16  Yes Raylene Everts, MD  cephALEXin (KEFLEX) 500 MG capsule Take 1 capsule (500 mg total) by mouth 4 (four) times daily. 02/18/17  Yes Nat Christen, MD  febuxostat (ULORIC) 40 MG tablet Take 40 mg by mouth daily.   Yes [provider]  gabapentin (NEURONTIN) 300 MG capsule Take 300 mg by mouth 2 (two) times daily.  09/22/16  Yes [provider]  ibuprofen (ADVIL,MOTRIN) 800 MG tablet Take for knee pain.  May take one a day.  Take with food 01/20/17  Yes Raylene Everts, MD  levothyroxine (SYNTHROID) 200 MCG tablet Take 1 tablet (200 mcg total) by  mouth daily before breakfast. 02/11/17  Yes Raylene Everts, MD  omeprazole (PRILOSEC) 20 MG capsule Take 1 capsule (20 mg total) by mouth daily. 09/30/16  Yes Fields, Marga Melnick, MD  Polysacchar Iron-FA-B12 (FERREX 150 FORTE) 150-1-25 MG-MG-MCG CAPS Take 1 capsule by mouth daily. 11/26/16  Yes Baird Cancer, PA-C  tamsulosin (FLOMAX) 0.4 MG CAPS capsule Take 1 capsule (0.4 mg total) by mouth daily. 10/13/16  Yes Raylene Everts, MD  HYDROcodone-acetaminophen (NORCO/VICODIN) 5-325 MG tablet Take 1 tablet by mouth every 4 (four) hours as needed. 02/19/17   Evalee Jefferson, PA-C  metoprolol succinate (TOPROL-XL) 25 MG 24 hr tablet Take 1 tablet (25 mg total) by mouth daily. Patient not taking: Reported on 02/19/2017 09/22/16   Raylene Everts, MD  predniSONE (DELTASONE) 10 MG tablet Take 6 tablets day one, 5 tablets day two, 4 tablets day three, 3 tablets day four, 2 tablets day five, then 1 tablet day six  02/19/17   Evalee Jefferson, PA-C  Vitamin D, Ergocalciferol, (DRISDOL) 50000 units CAPS capsule Take 1 capsule (50,000 Units total) by mouth every 7 (seven) days. Patient not taking: Reported on 02/18/2017 01/22/17   Raylene Everts, MD    Family History Family History  Problem Relation Age of Onset  . Diabetes Mother   . Renal Disease Mother        failure  . Diabetes Father        'old age'  . Hypertension Sister   . Hypertension Brother   . Diabetes Brother   . Diabetes Sister   . Diabetes Sister   . Diabetes Brother   . Hypertension Brother   . Diabetes Brother        right BKA  . Kidney failure Brother        on dialysis  . Kidney failure Brother        on dialysis  . Diabetes Brother        bilateral BKA  . Colon polyps Neg Hx   . Colon cancer Neg Hx   . Pancreatic disease Neg Hx     Social History Social History  Substance Use Topics  . Smoking status: Never Smoker  . Smokeless tobacco: Never Used     Comment: Quit x 10 years/ never smoked on regular basis  . Alcohol  use 1.2 oz/week    2 Shots of liquor per week     Comment: drinks on weekends, gin/vodka 1/5th.     Allergies   Aspirin   Review of Systems Review of Systems  Constitutional: Negative for fever.  Musculoskeletal: Positive for arthralgias and joint swelling. Negative for myalgias.  Neurological: Negative for weakness and numbness.     Physical Exam Updated Vital Signs BP (!) 140/95   Pulse 85   Temp 98.7 F (37.1 C)   Resp (!) 21   Ht 6' (1.829 m)   Wt 116.6 kg (257 lb)   SpO2 94%   BMI 34.86 kg/m   Physical Exam  Constitutional: He appears well-developed and well-nourished.  HENT:  Head: Atraumatic.  Neck: Normal range of motion.  Cardiovascular:  Pulses equal bilaterally  Musculoskeletal: He exhibits edema and tenderness.       Left hand: He exhibits tenderness and swelling. He exhibits no bony tenderness.  Mild left hand dorsal swelling through wrist.  No red streaking, mildly increased warmth, no erythema, skin intact.  Distal sensation normal with less than 2 sec fingertip cap refill.  Neurological: He is alert. He has normal strength. He displays normal reflexes. No sensory deficit.  Skin: Skin is warm and dry.  Psychiatric: He has a normal mood and affect.     ED Treatments / Results  Labs (all labs ordered are listed, but only abnormal results are displayed) Labs Reviewed - No data to display  EKG  EKG Interpretation None       Radiology  Reviewed and negative for acute injury.   Procedures Procedures (including critical care time)  Medications Ordered in ED Medications  HYDROcodone-acetaminophen (NORCO/VICODIN) 5-325 MG per tablet 1 tablet (1 tablet Oral Given 02/19/17 2256)  predniSONE (DELTASONE) tablet 60 mg (60 mg Oral Given 02/19/17 2350)     Initial Impression / Assessment and Plan / ED Course  I have reviewed the triage vital signs and the nursing notes.  Pertinent labs & imaging results that were available during my care of  the patient were reviewed by me and considered in my  medical decision making (see chart for details).     Exam and pt hx suggesting gout. No risk factors for infection. Skin intact.  Pt displays ROM of wrist with mild discomfort only, no c/w septic joint.  Prednisone taper, heat, hydrocodone. F/u with pcp this week for recheck for persistent sx.     Final Clinical Impressions(s) / ED Diagnoses   Final diagnoses:  Acute idiopathic gout of left wrist    New Prescriptions Discharge Medication List as of 02/19/2017 11:37 PM    START taking these medications   Details  predniSONE (DELTASONE) 10 MG tablet Take 6 tablets day one, 5 tablets day two, 4 tablets day three, 3 tablets day four, 2 tablets day five, then 1 tablet day six, Print         Evalee Jefferson, PA-C 02/20/17 [place]    Milton Ferguson, MD 02/22/17 [phone]

## 2017-02-19 NOTE — ED Triage Notes (Signed)
Pt c/o left hand/wrist swelling and pain over one week.

## 2017-02-19 NOTE — Discharge Instructions (Signed)
Take your next dose of prednisone tomorrow evening.  Apply warm compresses or a heating pad to your hand and wrist for 15 minutes several times daily.  You may take the hydrocodone prescribed for pain relief.  This will make you drowsy - do not drive within 4 hours of taking this medication.

## 2017-02-21 LAB — URINE CULTURE

## 2017-02-22 ENCOUNTER — Telehealth: Payer: Self-pay | Admitting: Emergency Medicine

## 2017-02-22 NOTE — Telephone Encounter (Signed)
Post ED Visit - Positive Culture Follow-up  Culture report reviewed by antimicrobial stewardship pharmacist:  []  Elenor Quinones, Pharm.D. []  Heide Guile, Pharm.D., BCPS AQ-ID []  Parks Neptune, Pharm.D., BCPS []  Alycia Rossetti, Pharm.D., BCPS []  [place], [place].D., BCPS, AAHIVP [x]  Legrand Como, Pharm.D., BCPS, AAHIVP []  Salome Arnt, PharmD, BCPS []  Dimitri Ped, PharmD, BCPS []  Vincenza Hews, PharmD, BCPS  Positive urine culture Treated with cephalexin, organism sensitive to the same and no further patient follow-up is required at this time.  Hazle Nordmann 02/22/2017, 2:21 PM

## 2017-02-23 ENCOUNTER — Telehealth: Payer: Self-pay | Admitting: Orthopedic Surgery

## 2017-02-23 ENCOUNTER — Other Ambulatory Visit (HOSPITAL_COMMUNITY): Payer: Self-pay | Admitting: Orthopedic Surgery

## 2017-02-23 MED ORDER — HYDROCODONE-ACETAMINOPHEN 5-325 MG PO TABS: 1.0000 | ORAL_TABLET | Freq: Three times a day (TID) | ORAL | 0 refills | Status: DC | PRN

## 2017-02-23 NOTE — Progress Notes (Signed)
[place] controlled substance reporting system reviewed  

## 2017-02-23 NOTE — Telephone Encounter (Signed)
ROUTING TO DR HARRISON 

## 2017-02-23 NOTE — Telephone Encounter (Signed)
Patient called to request refill of Hydrocodone 5-325. States "got only a few pills from [facility] Emergency room 02/19/17, for pain and swelling of hand.  Please advise.

## 2017-02-26 ENCOUNTER — Ambulatory Visit (INDEPENDENT_AMBULATORY_CARE_PROVIDER_SITE_OTHER): Payer: Medicare Other | Admitting: Family Medicine

## 2017-02-26 ENCOUNTER — Encounter: Payer: Self-pay | Admitting: Family Medicine

## 2017-02-26 ENCOUNTER — Other Ambulatory Visit: Payer: Self-pay

## 2017-02-26 VITALS — BP 120/80 | HR 148 | Temp 97.9°F | Resp 18 | Ht 72.0 in | Wt 244.0 lb

## 2017-02-26 DIAGNOSIS — M10432 Other secondary gout, left wrist: Secondary | ICD-10-CM | POA: Diagnosis not present

## 2017-02-26 DIAGNOSIS — E860 Dehydration: Secondary | ICD-10-CM

## 2017-02-26 DIAGNOSIS — M545 Low back pain, unspecified: Secondary | ICD-10-CM

## 2017-02-26 DIAGNOSIS — R000 Tachycardia, unspecified: Secondary | ICD-10-CM

## 2017-02-26 DIAGNOSIS — M25532 Pain in left wrist: Secondary | ICD-10-CM

## 2017-02-26 MED ORDER — KETOROLAC TROMETHAMINE 60 MG/2ML IM SOLN
60.0000 mg | Freq: Once | INTRAMUSCULAR | Status: AC
  Administered 2017-02-26: 60 mg via INTRAMUSCULAR

## 2017-02-26 MED ORDER — METHYLPREDNISOLONE ACETATE 80 MG/ML IJ SUSP
80.0000 mg | Freq: Once | INTRAMUSCULAR | Status: AC
  Administered 2017-02-26: 80 mg via INTRAMUSCULAR

## 2017-02-26 NOTE — Patient Instructions (Signed)
See me in one week  Elevate wrist to reduce swelling Take pain medicine as needed Drink plenty of water  The shots will help with pain and swelling

## 2017-02-26 NOTE — Progress Notes (Signed)
Chief Complaint  Patient presents with  . Arm Pain    left x 1 week, edema, [facility]  The patient is intellectually disabled, however is able to give reasonable history He was seen in the emergency room on 02/18/2017 for abdominal pain. X-rays and lab work were normal. Urinalysis revealed infection. Culture subsequently grew a resistant Escherichia coli. He was given 7 days of cephalexin. He took all 7 days of the medication. At this time he has no abdominal pain, urinary frequency, dysuria, flank pain or fever. The following day he woke up with a very swollen and painful left wrist. He was seen again in the emergency room. He was diagnosed with probable exacerbation of gout left wrist. He was given prednisone 10 mg in a tapering dose 60/50/40/30/20/10 mg a day. The dosage of this medication alarmed him, he did not take it. He was quite worried about taking 6 pills at once. He is taking either Tylenol or Norco 5 mg (prescribed by his orthopedic surgeon) for pain. These are not helping. He has not slept in a couple days. He states he hasn't eaten for 3 days. He has lost 13 pounds since his last visit. He is trying to drink water. He is feeling dizzy. Upon arrival for his visit, he felt dizzy when he was being placed into the room. His heart rate was found to be 148. An EKG done at that time showed sinus tachycardia. His pulse quickly returned to normal. He tells me that he occasionally has dizzy spells and rapid pulse. He also complains of low back pain with no radiation. No numbness or weakness in the legs. No change with bowels or bladder  Patient Active Problem List   Diagnosis Date Noted  . Hyperlipidemia 10/23/2016  . Mild intellectual disabilities (CODE) 09/29/2016  . Personal history of gout 09/22/2016  . Literacy level of illiterate 09/22/2016  . History of bilateral knee replacement 09/22/2016  . Carcinoid tumor determined by biopsy of stomach 04/10/2016  . Tubulovillous adenoma of large  intestine 09/12/2015  . Neuroendocrine tumor 09/25/2014  . Elevated LFTs 07/17/2014  . Iron deficiency anemia 07/29/2010  . Fatty liver 07/29/2010  . ABUSE, ALCOHOL, UNSPECIFIED 11/30/2008  . Osteoarthrosis involving lower leg 08/20/2008  . Essential hypertension 10/20/2006  . B12 deficiency 08/15/2006  . GERD 08/15/2006  . IBS 08/15/2006  . HYPRTRPHY PROSTATE BNG W/O URINARY OBST/LUTS 08/15/2006  . OSTEOARTHRITIS 08/15/2006  . DEGENERATION, DISC NOS 08/15/2006    Outpatient Encounter Prescriptions as of 02/26/2017  Medication Sig  . acetaminophen (TYLENOL) 500 MG tablet Take 500 mg by mouth every 6 (six) hours as needed for moderate pain.  Marland Kitchen amLODipine (NORVASC) 10 MG tablet Take 1 tablet (10 mg total) by mouth daily.  Marland Kitchen atorvastatin (LIPITOR) 20 MG tablet Take 1 tablet (20 mg total) by mouth daily.  . febuxostat (ULORIC) 40 MG tablet Take 40 mg by mouth daily.  Marland Kitchen gabapentin (NEURONTIN) 300 MG capsule Take 300 mg by mouth 2 (two) times daily.   Marland Kitchen HYDROcodone-acetaminophen (NORCO/VICODIN) 5-325 MG tablet Take 1 tablet by mouth every 8 (eight) hours as needed.  Marland Kitchen ibuprofen (ADVIL,MOTRIN) 800 MG tablet Take for knee pain.  May take one a day.  Take with food  . levothyroxine (SYNTHROID) 200 MCG tablet Take 1 tablet (200 mcg total) by mouth daily before breakfast.  . metoprolol succinate (TOPROL-XL) 25 MG 24 hr tablet Take 1 tablet (25 mg total) by mouth daily.  Marland Kitchen omeprazole (PRILOSEC) 20 MG  capsule Take 1 capsule (20 mg total) by mouth daily.  . Polysacchar Iron-FA-B12 (FERREX 150 FORTE) 150-1-25 MG-MG-MCG CAPS Take 1 capsule by mouth daily.  . tamsulosin (FLOMAX) 0.4 MG CAPS capsule Take 1 capsule (0.4 mg total) by mouth daily.  . Vitamin D, Ergocalciferol, (DRISDOL) 50000 units CAPS capsule Take 1 capsule (50,000 Units total) by mouth every 7 (seven) days.   No facility-administered encounter medications on file as of 02/26/2017.     Allergies  Allergen Reactions  . Aspirin      REACTION: Diverticular Bleed    Review of Systems  Constitutional: Positive for activity change, appetite change and unexpected weight change. Negative for chills and fever.  HENT: Negative.  Negative for dental problem.   Eyes: Negative.  Negative for visual disturbance.  Respiratory: Negative.  Negative for cough and shortness of breath.   Cardiovascular: Positive for palpitations. Negative for chest pain and leg swelling.  Gastrointestinal: Negative.  Negative for diarrhea, nausea and vomiting.  Genitourinary: Positive for decreased urine volume.       Urine dark  Musculoskeletal: Positive for arthralgias and joint swelling.  Skin: Negative for color change and rash.  Neurological: Positive for dizziness and light-headedness.  Psychiatric/Behavioral: Negative for decreased concentration and dysphoric mood. The patient is not nervous/anxious.     BP 120/80 (BP Location: Right Arm, Patient Position: Sitting, Cuff Size: Large)   Pulse (!) 148 Comment: apical  Temp 97.9 F (36.6 C) (Temporal)   Resp 18   Ht 6' (1.829 m)   Wt 244 lb 0.6 oz (110.7 kg)   SpO2 96%   BMI 33.10 kg/m   Physical Exam  Constitutional: He appears well-developed and well-nourished.  Obese. Poor fund of knowledge. Body odor. Appears moderately ill.  HENT:  Head: Normocephalic and atraumatic.  Right Ear: External ear normal.  Left Ear: External ear normal.  Mouth/Throat: Oropharynx is clear and moist.  Mucous membranes mildly dry  Eyes: Conjunctivae are normal. Pupils are equal, round, and reactive to light.  Neck: Normal range of motion.  Cardiovascular: Normal rate, regular rhythm and normal heart sounds.   Occasional ectopy  Pulmonary/Chest: Effort normal and breath sounds normal. He has no rales.  Abdominal: Soft. Bowel sounds are normal. He exhibits no distension.  No hepatosplenomegaly  Musculoskeletal:  Patient walks with a slightly flexed posture, guarded movements. Mild tenderness in the low  back muscles. Strength sensation and range of motion reflexes are symmetric in both lower extremities. Left wrist is swollen, tender, warm. No erythema of skin. Resists movement of wrist. Finger mobility is reduced. No numbness.  Lymphadenopathy:    He has no cervical adenopathy.  Neurological: He is alert. He displays normal reflexes.  Skin: Skin is dry. No erythema.  Psychiatric: He has a normal mood and affect.   poor insight and judgment.  ASSESSMENT/PLAN:  1. Other secondary acute gout of left wrist May be due to alcoholism.  2. Wrist pain, acute, left Synovitis/likely gout  3. Sinus tachycardia From exertion. Upper position. Likely from dehydration  4. Dehydration, moderate Poor by mouth intake secondary to pain  5. Acute low back pain without sciatica, unspecified back pain laterality  - methylPREDNISolone acetate (DEPO-MEDROL) injection 80 mg; Inject 1 mL (80 mg total) into the muscle once. - ketorolac (TORADOL) injection 60 mg; Inject 2 mLs (60 mg total) into the muscle once.   Patient Instructions  See me in one week  Elevate wrist to reduce swelling Take pain medicine as needed Drink plenty  of water  The shots will help with pain and swelling     Raylene Everts, MD

## 2017-03-05 ENCOUNTER — Encounter: Payer: Self-pay | Admitting: Family Medicine

## 2017-03-05 ENCOUNTER — Ambulatory Visit (INDEPENDENT_AMBULATORY_CARE_PROVIDER_SITE_OTHER): Payer: Medicare Other | Admitting: Family Medicine

## 2017-03-05 VITALS — BP 116/70 | HR 120 | Temp 98.1°F | Resp 16 | Ht 72.0 in | Wt 242.0 lb

## 2017-03-05 DIAGNOSIS — M25532 Pain in left wrist: Secondary | ICD-10-CM | POA: Diagnosis not present

## 2017-03-05 DIAGNOSIS — N3001 Acute cystitis with hematuria: Secondary | ICD-10-CM

## 2017-03-05 DIAGNOSIS — N390 Urinary tract infection, site not specified: Secondary | ICD-10-CM | POA: Diagnosis not present

## 2017-03-05 MED ORDER — UNABLE TO FIND: 0 refills | Status: DC

## 2017-03-05 MED ORDER — NITROFURANTOIN MONOHYD MACRO 100 MG PO CAPS: 100.0000 mg | ORAL_CAPSULE | Freq: Two times a day (BID) | ORAL | 0 refills | Status: DC

## 2017-03-05 MED ORDER — IBUPROFEN 800 MG PO TABS: ORAL_TABLET | ORAL | 0 refills | Status: DC

## 2017-03-05 NOTE — Patient Instructions (Signed)
Take the ibuprofen 2-3 tomes a day with food this is for the swelling and pain in your wrist Get a brace for the left wrist- these are at [facility] 2 times a day for 10 days This is for the urine infection Make sure you drink plenty of water  If your wrist is not better by next week, we will get Dr Aline Brochure to see you

## 2017-03-05 NOTE — Progress Notes (Signed)
Chief Complaint  Patient presents with  . Follow-up    1 week, left arm edema, pain   Improved pain and swelling in arm Back pain gone Shots helped Now with recurrence of UTI Nocturia X4  Patient Active Problem List   Diagnosis Date Noted  . Hyperlipidemia 10/23/2016  . Mild intellectual disabilities (CODE) 09/29/2016  . Personal history of gout 09/22/2016  . Literacy level of illiterate 09/22/2016  . History of bilateral knee replacement 09/22/2016  . Carcinoid tumor determined by biopsy of stomach 04/10/2016  . Tubulovillous adenoma of large intestine 09/12/2015  . Neuroendocrine tumor 09/25/2014  . Elevated LFTs 07/17/2014  . Iron deficiency anemia 07/29/2010  . Fatty liver 07/29/2010  . ABUSE, ALCOHOL, UNSPECIFIED 11/30/2008  . Osteoarthrosis involving lower leg 08/20/2008  . Essential hypertension 10/20/2006  . B12 deficiency 08/15/2006  . GERD 08/15/2006  . IBS 08/15/2006  . HYPRTRPHY PROSTATE BNG W/O URINARY OBST/LUTS 08/15/2006  . OSTEOARTHRITIS 08/15/2006  . DEGENERATION, DISC NOS 08/15/2006    Outpatient Encounter Prescriptions as of 03/05/2017  Medication Sig  . acetaminophen (TYLENOL) 500 MG tablet Take 500 mg by mouth every 6 (six) hours as needed for moderate pain.  Marland Kitchen amLODipine (NORVASC) 10 MG tablet Take 1 tablet (10 mg total) by mouth daily.  Marland Kitchen atorvastatin (LIPITOR) 20 MG tablet Take 1 tablet (20 mg total) by mouth daily.  . febuxostat (ULORIC) 40 MG tablet Take 40 mg by mouth daily.  Marland Kitchen gabapentin (NEURONTIN) 300 MG capsule Take 300 mg by mouth 2 (two) times daily.   Marland Kitchen HYDROcodone-acetaminophen (NORCO/VICODIN) 5-325 MG tablet Take 1 tablet by mouth every 8 (eight) hours as needed.  Marland Kitchen ibuprofen (ADVIL,MOTRIN) 800 MG tablet take one pill 2 - 3 times a day for wrist pain  Take with food  . levothyroxine (SYNTHROID) 200 MCG tablet Take 1 tablet (200 mcg total) by mouth daily before breakfast.  . metoprolol succinate (TOPROL-XL) 25 MG 24 hr tablet Take 1  tablet (25 mg total) by mouth daily.  Marland Kitchen omeprazole (PRILOSEC) 20 MG capsule Take 1 capsule (20 mg total) by mouth daily.  . Polysacchar Iron-FA-B12 (FERREX 150 FORTE) 150-1-25 MG-MG-MCG CAPS Take 1 capsule by mouth daily.  . tamsulosin (FLOMAX) 0.4 MG CAPS capsule Take 1 capsule (0.4 mg total) by mouth daily.  . Vitamin D, Ergocalciferol, (DRISDOL) 50000 units CAPS capsule Take 1 capsule (50,000 Units total) by mouth every 7 (seven) days.  . nitrofurantoin, macrocrystal-monohydrate, (MACROBID) 100 MG capsule Take 1 capsule (100 mg total) by mouth 2 (two) times daily.  Marland Kitchen UNABLE TO FIND Wrist brace, left  Dx: pain edema   No facility-administered encounter medications on file as of 03/05/2017.     Allergies  Allergen Reactions  . Aspirin     REACTION: Diverticular Bleed    Review of Systems  Constitutional: Negative for activity change, appetite change, chills, fever and unexpected weight change.  HENT: Negative.  Negative for dental problem.   Eyes: Negative.  Negative for visual disturbance.  Respiratory: Negative.  Negative for cough and shortness of breath.   Cardiovascular: Negative for chest pain, palpitations and leg swelling.  Gastrointestinal: Negative.  Negative for diarrhea, nausea and vomiting.  Genitourinary: Positive for decreased urine volume and frequency.       Urine dark  Musculoskeletal: Positive for arthralgias and joint swelling.  Skin: Negative for color change and rash.  Neurological: Negative for dizziness and light-headedness.  Psychiatric/Behavioral: Negative for decreased concentration and dysphoric mood. The patient is not  nervous/anxious.     BP 116/70 (BP Location: Right Arm, Patient Position: Sitting, Cuff Size: Normal)   Pulse (!) 120   Temp 98.1 F (36.7 C) (Temporal)   Resp 16   Ht 6' (1.829 m)   Wt 242 lb (109.8 kg)   SpO2 94%   BMI 32.82 kg/m   Physical Exam  Constitutional: He appears well-developed and well-nourished.  Obese. Poor fund  of knowledge.  HENT:  Head: Normocephalic and atraumatic.  Mouth/Throat: Oropharynx is clear and moist.  Eyes: Conjunctivae are normal. Pupils are equal, round, and reactive to light.  Neck: Normal range of motion.  Cardiovascular: Normal rate, regular rhythm and normal heart sounds.   Occasional ectopy  Pulmonary/Chest: Effort normal and breath sounds normal. He has no rales.  Abdominal: Soft. Bowel sounds are normal. He exhibits no distension.  Musculoskeletal:  Left wrist is swollen, tender, warm. No erythema of skin. Resists movement of wrist. Finger mobility is reduced. No numbness. swelling is down from last week  Lymphadenopathy:    He has no cervical adenopathy.  Neurological: He is alert. He displays normal reflexes.  Skin: Skin is dry. No erythema.  Psychiatric: He has a normal mood and affect.    ASSESSMENT/PLAN:  1. Acute cystitis with hematuria recurrent - Ambulatory referral to Urology  2. Recurrent UTI (urinary tract infection)  - Ambulatory referral to Urology  3. Wrist pain, acute, left Improved.  Still looks like a tenosynovitis.  Will recommend limited use ibuprofen with food   Patient Instructions  Take the ibuprofen 2-3 tomes a day with food this is for the swelling and pain in your wrist Get a brace for the left wrist- these are at [facility] 2 times a day for 10 days This is for the urine infection Make sure you drink plenty of water  If your wrist is not better by next week, we will get Dr Aline Brochure to see you     Raylene Everts, MD

## 2017-03-15 ENCOUNTER — Other Ambulatory Visit (HOSPITAL_COMMUNITY): Payer: Self-pay | Admitting: Emergency Medicine

## 2017-03-15 ENCOUNTER — Telehealth (HOSPITAL_COMMUNITY): Payer: Self-pay | Admitting: *Deleted

## 2017-03-15 ENCOUNTER — Telehealth: Payer: Self-pay | Admitting: Family Medicine

## 2017-03-15 DIAGNOSIS — M25539 Pain in unspecified wrist: Secondary | ICD-10-CM

## 2017-03-15 DIAGNOSIS — D508 Other iron deficiency anemias: Secondary | ICD-10-CM

## 2017-03-15 MED ORDER — POLYSACCHAR IRON-FA-B12 150-1-25 MG-MG-MCG PO CAPS: 1.0000 | ORAL_CAPSULE | Freq: Every day | ORAL | 4 refills | Status: DC

## 2017-03-15 NOTE — Progress Notes (Signed)
Iron refilled

## 2017-03-15 NOTE — Telephone Encounter (Signed)
New Message  Pt voiced his left wrist has not gotten any better and he stated he needs an appt with MD-Harris per 7.6.18 patient instruction note per MD-Simpson.  Pt walked in to discuss is wrist issue.

## 2017-03-22 ENCOUNTER — Encounter (HOSPITAL_COMMUNITY): Payer: Medicare Other | Attending: Oncology | Admitting: Oncology

## 2017-03-22 ENCOUNTER — Encounter (HOSPITAL_COMMUNITY): Payer: Medicare Other

## 2017-03-22 ENCOUNTER — Other Ambulatory Visit (HOSPITAL_COMMUNITY): Payer: Self-pay | Admitting: Oncology

## 2017-03-22 VITALS — BP 131/77 | HR 81 | Temp 98.4°F | Resp 20 | Wt 242.4 lb

## 2017-03-22 DIAGNOSIS — Z841 Family history of disorders of kidney and ureter: Secondary | ICD-10-CM | POA: Diagnosis not present

## 2017-03-22 DIAGNOSIS — Z833 Family history of diabetes mellitus: Secondary | ICD-10-CM | POA: Insufficient documentation

## 2017-03-22 DIAGNOSIS — K219 Gastro-esophageal reflux disease without esophagitis: Secondary | ICD-10-CM | POA: Insufficient documentation

## 2017-03-22 DIAGNOSIS — D3A8 Other benign neuroendocrine tumors: Secondary | ICD-10-CM

## 2017-03-22 DIAGNOSIS — Z8711 Personal history of peptic ulcer disease: Secondary | ICD-10-CM | POA: Insufficient documentation

## 2017-03-22 DIAGNOSIS — D509 Iron deficiency anemia, unspecified: Secondary | ICD-10-CM

## 2017-03-22 DIAGNOSIS — D500 Iron deficiency anemia secondary to blood loss (chronic): Secondary | ICD-10-CM

## 2017-03-22 DIAGNOSIS — C7A092 Malignant carcinoid tumor of the stomach: Secondary | ICD-10-CM

## 2017-03-22 DIAGNOSIS — K5790 Diverticulosis of intestine, part unspecified, without perforation or abscess without bleeding: Secondary | ICD-10-CM | POA: Insufficient documentation

## 2017-03-22 DIAGNOSIS — Z8249 Family history of ischemic heart disease and other diseases of the circulatory system: Secondary | ICD-10-CM | POA: Insufficient documentation

## 2017-03-22 DIAGNOSIS — I10 Essential (primary) hypertension: Secondary | ICD-10-CM | POA: Diagnosis not present

## 2017-03-22 DIAGNOSIS — E538 Deficiency of other specified B group vitamins: Secondary | ICD-10-CM | POA: Diagnosis not present

## 2017-03-22 DIAGNOSIS — Z9889 Other specified postprocedural states: Secondary | ICD-10-CM | POA: Insufficient documentation

## 2017-03-22 DIAGNOSIS — M109 Gout, unspecified: Secondary | ICD-10-CM | POA: Insufficient documentation

## 2017-03-22 DIAGNOSIS — Z9049 Acquired absence of other specified parts of digestive tract: Secondary | ICD-10-CM | POA: Insufficient documentation

## 2017-03-22 DIAGNOSIS — E039 Hypothyroidism, unspecified: Secondary | ICD-10-CM | POA: Diagnosis not present

## 2017-03-22 DIAGNOSIS — E876 Hypokalemia: Secondary | ICD-10-CM

## 2017-03-22 DIAGNOSIS — G8929 Other chronic pain: Secondary | ICD-10-CM | POA: Insufficient documentation

## 2017-03-22 DIAGNOSIS — M25569 Pain in unspecified knee: Secondary | ICD-10-CM | POA: Diagnosis not present

## 2017-03-22 LAB — CBC WITH DIFFERENTIAL/PLATELET
Basophils Absolute: 0 10*3/uL (ref 0.0–0.1)
Basophils Relative: 1 %
Eosinophils Absolute: 0.2 10*3/uL (ref 0.0–0.7)
Eosinophils Relative: 3 %
HEMATOCRIT: 36.7 % — AB (ref 39.0–52.0)
Hemoglobin: 12 g/dL — ABNORMAL LOW (ref 13.0–17.0)
LYMPHS ABS: 1.8 10*3/uL (ref 0.7–4.0)
LYMPHS PCT: 29 %
MCH: 29.6 pg (ref 26.0–34.0)
MCHC: 32.7 g/dL (ref 30.0–36.0)
MCV: 90.4 fL (ref 78.0–100.0)
MONO ABS: 0.8 10*3/uL (ref 0.1–1.0)
MONOS PCT: 12 %
NEUTROS ABS: 3.5 10*3/uL (ref 1.7–7.7)
NEUTROS PCT: 55 %
Platelets: 164 10*3/uL (ref 150–400)
RBC: 4.06 MIL/uL — ABNORMAL LOW (ref 4.22–5.81)
RDW: 14.2 % (ref 11.5–15.5)
WBC: 6.2 10*3/uL (ref 4.0–10.5)

## 2017-03-22 LAB — COMPREHENSIVE METABOLIC PANEL
ALBUMIN: 3.3 g/dL — AB (ref 3.5–5.0)
ALK PHOS: 74 U/L (ref 38–126)
ALT: 15 U/L — ABNORMAL LOW (ref 17–63)
ANION GAP: 8 (ref 5–15)
AST: 20 U/L (ref 15–41)
BUN: 17 mg/dL (ref 6–20)
CHLORIDE: 102 mmol/L (ref 101–111)
CO2: 27 mmol/L (ref 22–32)
Calcium: 8.8 mg/dL — ABNORMAL LOW (ref 8.9–10.3)
Creatinine, Ser: 1.11 mg/dL (ref 0.61–1.24)
GFR calc Af Amer: >60 mL/min (ref >60)
GFR calc non Af Amer: >60 mL/min (ref >60)
GLUCOSE: 104 mg/dL — AB (ref 65–99)
POTASSIUM: 3.3 mmol/L — AB (ref 3.5–5.1)
SODIUM: 137 mmol/L (ref 135–145)
Total Bilirubin: 0.6 mg/dL (ref 0.3–1.2)
Total Protein: 7.7 g/dL (ref 6.5–8.1)

## 2017-03-22 LAB — IRON AND TIBC
Iron: 55 ug/dL (ref 45–182)
SATURATION RATIOS: 21 % (ref 17.9–39.5)
TIBC: 262 ug/dL (ref 250–450)
UIBC: 207 ug/dL

## 2017-03-22 LAB — FERRITIN: Ferritin: 86 ng/mL (ref 24–336)

## 2017-03-22 MED ORDER — POTASSIUM CHLORIDE CRYS ER 20 MEQ PO TBCR: 20.0000 meq | EXTENDED_RELEASE_TABLET | Freq: Every day | ORAL | 0 refills | Status: DC

## 2017-03-22 NOTE — Assessment & Plan Note (Addendum)
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 Oneida Alar on 08/14/2014 and 10/08/2015 with negative octreotide scan on 09/19/2014.  His case was presented at GI tumor board in February/March 2016 with recommendation for anatomic biopsies to guide role of surgical resection.  Anatomic biopsies in 03/2016 were negative. AND Iron deficiency anemia, on Ferrex forte beginning on 02/04/2016.  Labs today. CBC diff, CMET, serum serotonin, chromogranin A, iron/TIBC, ferritin.  I personally reviewed and went over laboratory results with the patient.  The results are noted within this dictation.  Labs in 6 months: CBC diff, CMET, serum serotonin, chromogranin A, iron/TIBC, ferritin.   He will have GI follow-up in Jan 2019.  Return in 6 months for follow-up.

## 2017-03-22 NOTE — Progress Notes (Signed)
Philip Everts, MD [phone] [address] [place] [address]  Neuroendocrine tumor - Plan: CBC with Differential, Comprehensive metabolic panel, Serotonin serum, Chromogranin A  Iron deficiency anemia due to chronic blood loss - Plan: CBC with Differential, Iron and TIBC, Ferritin  CURRENT THERAPY: Surveillance and continued oral iron replacement with ferrous forte beginning on 02/04/2016.  INTERVAL HISTORY: [name] 66 y.o. male returns for followup of well differentiated neuroendocrine carcinoid tumor on polypectomy by Dr. Oneida Alar on 08/14/2014 and 10/08/2015 with negative octreotide scan on 09/19/2014.  His case was presented at GI tumor board in February/March 2016 with recommendation for anatomic biopsies to guide role of surgical resection.  Anatomic biopsies in 03/2016 were negative. AND Iron deficiency anemia, on Ferrex forte beginning on 02/04/2016.  HPI Elements   Location: [facility]  Quality: Neuroendocrine carcinoid tumor  Severity: Well-differentiated  Duration: Dx on 08/14/2014  Context: Negative octreotide scan  Timing:   Modifying Factors: Iron deficiency anemia  Associated Signs & Symptoms:     Review of Systems  Constitutional: Negative.  Negative for chills, fever and weight loss.  HENT: Negative.   Eyes: Negative.   Respiratory: Negative.  Negative for cough.   Cardiovascular: Negative.  Negative for chest pain.  Gastrointestinal: Negative.  Negative for blood in stool, constipation, diarrhea, melena, nausea and vomiting.  Genitourinary: Negative.   Musculoskeletal: Negative.   Skin: Negative.   Neurological: Negative.  Negative for weakness.  Endo/Heme/Allergies: Negative.   Psychiatric/Behavioral: Negative.     Past Medical History:  Diagnosis Date  . Anemia FeDA: GASTRIC POLYPS, B12   TCS 2008 EGD 2009, 2008-HB 11.1 MCV 83.6 CR 1.22, 2009 FERRITIN 102-22  . B12 deficiency   . Cancer ([place])   . Carcinoid tumor determined by biopsy of  stomach   . Chronic knee pain   . Diverticulosis 2008 LGIB  . DIVERTICULOSIS, COLON 08/15/2006   Qualifier: Diagnosis of  By: Jonna Munro MD, Roderic Scarce    . GERD (gastroesophageal reflux disease)   . Gout   . Hepatomegaly 2o to FATTY LIVER DZ  . History of alcohol abuse   . History of septic arthritis   . HTN (hypertension)   . Hypothyroidism   . Neuroendocrine tumor 09/25/2014  . PUD 11/30/2008   Qualifier: History of  By: Jonna Munro MD, Roderic Scarce    . Substance abuse     Past Surgical History:  Procedure Laterality Date  . BIOPSY  08/14/2014   Procedure: BIOPSY;  Surgeon: Danie Binder, MD;  Location: [facility];  Service: Endoscopy;;  . BIOPSY  03/12/2015   Procedure: BIOPSY;  Surgeon: Danie Binder, MD;  Location: [facility];  Service: Endoscopy;;  . BIOPSY  04/21/2016   Procedure: BIOPSY;  Surgeon: Danie Binder, MD;  [facility];  Service: Endoscopy;;  bx's of antrum, body of stomach, fundus, and cardia  . CHOLECYSTECTOMY    . COLONOSCOPY  2008 [facility] DJ   LGIB 2o to TICS, prep good  . COLONOSCOPY WITH PROPOFOL N/A 08/14/2014   SLF: 1. Four large colon polyps removed. 2. The left colon is redundant 3. Moderate diverticulosis throughout teh entire examined colon  . COLONOSCOPY WITH PROPOFOL N/A 11/06/2014   SLF: 8 small polyps removed. One large pedunculated polyp removed from the ascending colon, tubulovillous and tubular adenomas. Next colonoscopy March 2019  . ESOPHAGOGASTRODUODENOSCOPY  12/08/2007   DVV:OHYW gastric polyps seen in the cardia and body of the stomach/Normal esophagus without evidence  of Barrett's, mass, erosion/Normal duodenal bulb and second portion of the duodenum. Benign bx.  . ESOPHAGOGASTRODUODENOSCOPY (EGD) WITH PROPOFOL N/A 08/14/2014   SLF: 1. Heme postive stools due to gastric and colon polyps 2. Multiple gastric   . ESOPHAGOGASTRODUODENOSCOPY (EGD) WITH PROPOFOL N/A 03/12/2015   SLF: Multiple gastric nodules seen in gasric body/antrum. 2. Non-erosive  gastritis ( inflammation) was found in the gastric antrum.   . ESOPHAGOGASTRODUODENOSCOPY (EGD) WITH PROPOFOL N/A 10/08/2015   Procedure: ESOPHAGOGASTRODUODENOSCOPY (EGD) WITH PROPOFOL;  Surgeon: Danie Binder, MD;  [facility];  Service: Endoscopy;  Laterality: N/A;  0945  . ESOPHAGOGASTRODUODENOSCOPY (EGD) WITH PROPOFOL N/A 04/21/2016   Procedure: ESOPHAGOGASTRODUODENOSCOPY (EGD) WITH PROPOFOL;  Surgeon: Danie Binder, MD;  [facility];  Service: Endoscopy;  Laterality: N/A;  730   . HAND SURGERY    . JOINT REPLACEMENT    . POLYPECTOMY  08/14/2014   Procedure: POLYPECTOMY;  Surgeon: Danie Binder, MD;  [facility];  Service: Endoscopy;;  . POLYPECTOMY N/A 11/06/2014   Procedure: POLYPECTOMY;  Surgeon: Danie Binder, MD;  Location: [facility];  Service: Endoscopy;  Laterality: N/A;  Transverse Colon x3, Ascending Colon x2, Descending Colon x3  . REPLACEMENT TOTAL KNEE BILATERAL    . UPPER GASTROINTESTINAL ENDOSCOPY  APR 2009   INFLAMED HYPERPLASTIC POLYPS, CHRONIC GASTRITIS    Family History  Problem Relation Age of Onset  . Diabetes Mother   . Renal Disease Mother        failure  . Diabetes Father        'old age'  . Hypertension Sister   . Hypertension Brother   . Diabetes Brother   . Diabetes Sister   . Diabetes Sister   . Diabetes Brother   . Hypertension Brother   . Diabetes Brother        right BKA  . Kidney failure Brother        on dialysis  . Kidney failure Brother        on dialysis  . Diabetes Brother        bilateral BKA  . Colon polyps Neg Hx   . Colon cancer Neg Hx   . Pancreatic disease Neg Hx     Social History   Social History  . Marital status: Single    Spouse name: N/A  . Number of children: 0  . Years of education: 3   Occupational History  . retired     farming   Social History Main Topics  . Smoking status: Never Smoker  . Smokeless tobacco: Never Used     Comment: Quit x 10 years/ never smoked on regular basis   . Alcohol use 1.2 oz/week    2 Shots of liquor per week     Comment: drinks on weekends, gin/vodka 1/5th.  . Drug use: No  . Sexual activity: No   Other Topics Concern  . Not on file   Social History Narrative   HE DOES NOT HAVE ANY CHILDREN.   Lives alone   Does not drive   CAN NOT READ     PHYSICAL EXAMINATION  ECOG PERFORMANCE STATUS: 0 - Asymptomatic  Vitals:   03/22/17 1050  BP: 131/77  Pulse: 81  Resp: 20  Temp: 98.4 F (36.9 C)    GENERAL:alert, no distress, well nourished, well developed, comfortable, cooperative, obese, smiling and unaccompanied SKIN: skin color, texture, turgor are normal, no rashes or significant lesions HEAD: Normocephalic, No masses, lesions, tenderness  or abnormalities EYES: normal, EOMI, Conjunctiva are pink and non-injected EARS: External ears normal OROPHARYNX:lips, buccal mucosa, and tongue normal and mucous membranes are moist  NECK: supple, no adenopathy, trachea midline LYMPH:  no palpable lymphadenopathy, no hepatosplenomegaly BREAST:not examined LUNGS: clear to auscultation and percussion HEART: regular rate & rhythm, no murmurs, no gallops, S1 normal and S2 normal ABDOMEN:abdomen soft, non-tender, obese and normal bowel sounds BACK: Back symmetric, no curvature., No CVA tenderness EXTREMITIES:less then 2 second capillary refill, no joint deformities, effusion, or inflammation, no edema, no skin discoloration, no clubbing, no cyanosis  NEURO: alert & oriented x 3 with fluent speech, no focal motor/sensory deficits, gait normal   LABORATORY DATA: CBC    Component Value Date/Time   WBC 6.2 03/22/2017 0958   RBC 4.06 (L) 03/22/2017 0958   HGB 12.0 (L) 03/22/2017 0958   HCT 36.7 (L) 03/22/2017 0958   HCT 44 01/05/2013   PLT 164 03/22/2017 0958   PLT 148 01/05/2013   MCV 90.4 03/22/2017 0958   MCV 90.4 01/05/2013   MCH 29.6 03/22/2017 0958   MCHC 32.7 03/22/2017 0958   RDW 14.2 03/22/2017 0958   LYMPHSABS 1.8  03/22/2017 0958   MONOABS 0.8 03/22/2017 0958   EOSABS 0.2 03/22/2017 0958   BASOSABS 0.0 03/22/2017 0958      Chemistry      Component Value Date/Time   NA 137 03/22/2017 0958   K 3.3 (L) 03/22/2017 0958   CL 102 03/22/2017 0958   CO2 27 03/22/2017 0958   BUN 17 03/22/2017 0958   CREATININE 1.11 03/22/2017 0958   CREATININE 1.28 (H) 01/20/2017 0926      Component Value Date/Time   CALCIUM 8.8 (L) 03/22/2017 0958   ALKPHOS 74 03/22/2017 0958   AST 20 03/22/2017 0958   ALT 15 (L) 03/22/2017 0958   BILITOT 0.6 03/22/2017 0958     Results for MANDEL, SEIDEN (MRN [id number]) as of 03/22/2017 10:46  Ref. Range 11/18/2016 09:26  Chromogranin A Latest Ref Range: 0 - 5 nmol/L 3    Results for NASH, BOLLS (MRN [id number]) as of 03/22/2017 10:46  Ref. Range 11/18/2016 09:26  Serotonin, Serum Latest Ref Range: 21 - 321 ng/mL 95   Lab Results  Component Value Date   IRON 67 11/18/2016   TIBC 372 11/18/2016   FERRITIN 36 11/18/2016     PENDING LABS:   RADIOGRAPHIC STUDIES:  No results found.   PATHOLOGY:    ASSESSMENT AND PLAN:  Neuroendocrine tumor Well-differentiated neuroendocrine carcinoid tumor on polypectomy by Dr. Oneida Alar on 08/14/2014 and 10/08/2015 with negative octreotide scan on 09/19/2014.  His case was presented at GI tumor board in February/March 2016 with recommendation for anatomic biopsies to guide role of surgical resection.  Anatomic biopsies in 03/2016 were negative. AND Iron deficiency anemia, on Ferrex forte beginning on 02/04/2016.  Labs today. CBC diff, CMET, serum serotonin, chromogranin A, iron/TIBC, ferritin.  I personally reviewed and went over laboratory results with the patient.  The results are noted within this dictation.  Labs in 6 months: CBC diff, CMET, serum serotonin, chromogranin A, iron/TIBC, ferritin.   He will have GI follow-up in Jan 2019.  Return in 6 months for follow-up.   ORDERS PLACED FOR THIS ENCOUNTER: Orders Placed This  Encounter  Procedures  . CBC with Differential  . Comprehensive metabolic panel  . Iron and TIBC  . Ferritin  . Serotonin serum  . Chromogranin A    MEDICATIONS PRESCRIBED THIS ENCOUNTER: No orders of  the defined types were placed in this encounter.   THERAPY PLAN:  Continue to monitor iron studies in addition to signs/symptoms for neuroendocrine tumor requiring further intervention/investigation.  All questions were answered. The patient knows to call the clinic with any problems, questions or concerns. We can certainly see the patient much sooner if necessary.  Patient and plan discussed with Dr. Twana First and she is in agreement with the aforementioned.   This note is electronically signed by: Doy Mince 03/22/2017 10:58 AM

## 2017-03-22 NOTE — Patient Instructions (Addendum)
[place] at [facility] Discharge Instructions  RECOMMENDATIONS MADE BY THE CONSULTANT AND ANY TEST RESULTS WILL BE SENT TO YOUR REFERRING PHYSICIAN.  Continue iron tablets  Labsin 6 months and return in 6 months to see doctor   Thank you for choosing [place] at [facility] to provide your oncology and hematology care.  To afford each patient quality time with our provider, please arrive at least 15 minutes before your scheduled appointment time.    If you have a lab appointment with the [facility] please come in thru the  Main Entrance and check in at the main information desk  You need to re-schedule your appointment should you arrive 10 or more minutes late.  We strive to give you quality time with our providers, and arriving late affects you and other patients whose appointments are after yours.  Also, if you no show three or more times for appointments you may be dismissed from the clinic at the providers discretion.     Again, thank you for choosing [facility].  Our hope is that these requests will decrease the amount of time that you wait before being seen by our physicians.       _____________________________________________________________  Should you have questions after your visit to [facility], please contact our office at [phone] between the hours of 8:30 a.m. and 4:30 p.m.  Voicemails left after 4:30 p.m. will not be returned until the following business day.  For prescription refill requests, have your pharmacy contact our office.       Resources For Cancer Patients and their Caregivers ? American Cancer Society: Can assist with transportation, wigs, general needs, runs Look Good Feel Better.        (815)161-6730 ? Cancer Care: Provides financial assistance, online support groups, medication/co-pay assistance.  1-800-813-HOPE 231 313 9460) ? [facility]  [place] cancer patients and their families through emotional , educational and financial support.  540-711-8023 ? [place] DSS Where to apply for food stamps, Medicaid and utility assistance. (830)811-7971 ? [employer]: Transportation to medical appointments. 408 805 1207 ? Social Security Administration: May apply for disability if have a Stage IV cancer. 223-110-3785 (770)577-4286 ? [employer], Disability and Transit Services: Assists with nutrition, care and transit needs. [place] Support Programs: @10RELATIVEDAYS @ > Cancer Support Group  2nd Tuesday of the month 1pm-2pm, Journey Room  > Creative Journey  3rd Tuesday of the month 1130am-1pm, Journey Room  > Look Good Feel Better  1st Wednesday of the month 10am-12 noon, Journey Room (Call [facility] to register [phone])

## 2017-03-24 ENCOUNTER — Encounter: Payer: Self-pay | Admitting: Orthopaedic Surgery

## 2017-03-24 ENCOUNTER — Ambulatory Visit (INDEPENDENT_AMBULATORY_CARE_PROVIDER_SITE_OTHER): Payer: Medicare Other | Admitting: Orthopaedic Surgery

## 2017-03-24 VITALS — BP 141/88 | HR 93 | Temp 98.0°F | Ht 72.0 in | Wt 236.0 lb

## 2017-03-24 DIAGNOSIS — M10042 Idiopathic gout, left hand: Secondary | ICD-10-CM

## 2017-03-24 LAB — CHROMOGRANIN A: CHROMOGRANIN A: 4 nmol/L (ref 0–5)

## 2017-03-24 MED ORDER — COLCHICINE 0.6 MG PO TABS: ORAL_TABLET | ORAL | 3 refills | Status: DC

## 2017-03-24 MED ORDER — PREDNISONE 5 MG (21) PO TBPK: ORAL_TABLET | ORAL | 0 refills | Status: DC

## 2017-03-24 MED ORDER — HYDROCODONE-ACETAMINOPHEN 5-325 MG PO TABS: ORAL_TABLET | ORAL | 0 refills | Status: DC

## 2017-03-24 NOTE — Addendum Note (Signed)
Addended by: Glory Buff on: 03/24/2017 09:56 AM   Modules accepted: Orders

## 2017-03-24 NOTE — Progress Notes (Signed)
Patient UD:[name], male DOB:April 14, 1951, 66 y.o. YOV:[id number]  Chief Complaint  Patient presents with  . Hand Problem    LEFT HAND PAIN AND SWELLING    HPI  [name] is a 66 y.o. male who is a patient of Dr. Ruthe King who has pain in the left hand with swelling.  He has seen Dr. Meda King for this and had x-rays on 02-19-17.  He has no trauma.  He has a history of gout but cannot remember if he takes gout medicine.  Uloric is listed on his medicine list.  He has soreness with motion. He says nothing has helped.  He has no other joint involvement. HPI  Body mass index is 32.01 kg/m.  ROS  Review of Systems  HENT: Negative for congestion.   Respiratory: Negative for cough and shortness of breath.   Cardiovascular: Negative for chest pain and leg swelling.  Endocrine: Positive for cold intolerance.  Musculoskeletal: Positive for arthralgias and joint swelling.  Allergic/Immunologic: Positive for environmental allergies.    Past Medical History:  Diagnosis Date  . Anemia FeDA: GASTRIC POLYPS, B12   TCS 2008 EGD 2009, 2008-HB 11.1 MCV 83.6 CR 1.22, 2009 FERRITIN 102-22  . B12 deficiency   . Cancer ([place])   . Carcinoid tumor determined by biopsy of stomach   . Chronic knee pain   . Diverticulosis 2008 LGIB  . DIVERTICULOSIS, COLON 08/15/2006   Qualifier: Diagnosis of  By: Philip Munro MD, [name]    . GERD (gastroesophageal reflux disease)   . Gout   . Hepatomegaly 2o to FATTY LIVER DZ  . History of alcohol abuse   . History of septic arthritis   . HTN (hypertension)   . Hypothyroidism   . Neuroendocrine tumor 09/25/2014  . PUD 11/30/2008   Qualifier: History of  By: Philip Munro MD, [name]    . Substance abuse     Past Surgical History:  Procedure Laterality Date  . BIOPSY  08/14/2014   Procedure: BIOPSY;  Surgeon: Philip Binder, MD;  [facility]: [facility];  Service: Endoscopy;;  . BIOPSY  03/12/2015   Procedure: BIOPSY;  Surgeon: Philip Binder, MD;  Location: [facility];   Service: Endoscopy;;  . BIOPSY  04/21/2016   Procedure: BIOPSY;  Surgeon: Philip Binder, MD;  [facility];  Service: Endoscopy;;  bx's of antrum, body of stomach, fundus, and cardia  . CHOLECYSTECTOMY    . COLONOSCOPY  2008 [facility] DJ   LGIB 2o to TICS, prep good  . COLONOSCOPY WITH PROPOFOL N/A 08/14/2014   SLF: 1. Four large colon polyps removed. 2. The left colon is redundant 3. Moderate diverticulosis throughout teh entire examined colon  . COLONOSCOPY WITH PROPOFOL N/A 11/06/2014   SLF: 8 small polyps removed. One large pedunculated polyp removed from the ascending colon, tubulovillous and tubular adenomas. Next colonoscopy March 2019  . ESOPHAGOGASTRODUODENOSCOPY  12/08/2007   XAJ:OINO gastric polyps seen in the cardia and body of the stomach/Normal esophagus without evidence of Barrett's, mass, erosion/Normal duodenal bulb and second portion of the duodenum. Benign bx.  . ESOPHAGOGASTRODUODENOSCOPY (EGD) WITH PROPOFOL N/A 08/14/2014   SLF: 1. Heme postive stools due to gastric and colon polyps 2. Multiple gastric   . ESOPHAGOGASTRODUODENOSCOPY (EGD) WITH PROPOFOL N/A 03/12/2015   SLF: Multiple gastric nodules seen in gasric body/antrum. 2. Non-erosive gastritis ( inflammation) was found in the gastric antrum.   . ESOPHAGOGASTRODUODENOSCOPY (EGD) WITH PROPOFOL N/A 10/08/2015   Procedure: ESOPHAGOGASTRODUODENOSCOPY (EGD) WITH PROPOFOL;  Surgeon: Philip Binder,  MD;  [facility];  Service: Endoscopy;  Laterality: N/A;  0945  . ESOPHAGOGASTRODUODENOSCOPY (EGD) WITH PROPOFOL N/A 04/21/2016   Procedure: ESOPHAGOGASTRODUODENOSCOPY (EGD) WITH PROPOFOL;  Surgeon: Philip Binder, MD;  [facility];  Service: Endoscopy;  Laterality: N/A;  730   . HAND SURGERY    . JOINT REPLACEMENT    . POLYPECTOMY  08/14/2014   Procedure: POLYPECTOMY;  Surgeon: Philip Binder, MD;  Location: [facility];  Service: Endoscopy;;  . POLYPECTOMY N/A 11/06/2014   Procedure: POLYPECTOMY;  Surgeon:  Philip Binder, MD;  Location: [facility];  Service: Endoscopy;  Laterality: N/A;  Transverse Colon x3, Ascending Colon x2, Descending Colon x3  . REPLACEMENT TOTAL KNEE BILATERAL    . UPPER GASTROINTESTINAL ENDOSCOPY  APR 2009   INFLAMED HYPERPLASTIC POLYPS, CHRONIC GASTRITIS    Family History  Problem Relation Age of Onset  . Diabetes Mother   . Renal Disease Mother        failure  . Diabetes Father        'old age'  . Hypertension Sister   . Hypertension Brother   . Diabetes Brother   . Diabetes Sister   . Diabetes Sister   . Diabetes Brother   . Hypertension Brother   . Diabetes Brother        right BKA  . Kidney failure Brother        on dialysis  . Kidney failure Brother        on dialysis  . Diabetes Brother        bilateral BKA  . Colon polyps Neg Hx   . Colon cancer Neg Hx   . Pancreatic disease Neg Hx     Social History Social History  Substance Use Topics  . Smoking status: Never Smoker  . Smokeless tobacco: Never Used     Comment: Quit x 10 years/ never smoked on regular basis  . Alcohol use 1.2 oz/week    2 Shots of liquor per week     Comment: drinks on weekends, gin/vodka 1/5th.    Allergies  Allergen Reactions  . Aspirin     REACTION: Diverticular Bleed    Current Outpatient Prescriptions  Medication Sig Dispense Refill  . acetaminophen (TYLENOL) 500 MG tablet Take 500 mg by mouth every 6 (six) hours as needed for moderate pain.    Marland Kitchen amLODipine (NORVASC) 10 MG tablet Take 1 tablet (10 mg total) by mouth daily. 90 tablet 3  . atorvastatin (LIPITOR) 20 MG tablet Take 1 tablet (20 mg total) by mouth daily. 90 tablet 3  . colchicine 0.6 MG tablet One by mouth three times a day for five days for gout pain. 15 tablet 3  . febuxostat (ULORIC) 40 MG tablet Take 40 mg by mouth daily.    Marland Kitchen gabapentin (NEURONTIN) 300 MG capsule Take 300 mg by mouth 2 (two) times daily.     Marland Kitchen HYDROcodone-acetaminophen (NORCO/VICODIN) 5-325 MG tablet One tablet every four  hours as needed for acute pain.  Limit of five days per [place]. 30 tablet 0  . ibuprofen (ADVIL,MOTRIN) 800 MG tablet take one pill 2 - 3 times a day for wrist pain  Take with food 30 tablet 0  . levothyroxine (SYNTHROID) 200 MCG tablet Take 1 tablet (200 mcg total) by mouth daily before breakfast. 90 tablet 0  . omeprazole (PRILOSEC) 20 MG capsule Take 1 capsule (20 mg total) by mouth daily. 30 capsule 11  . Polysacchar  Iron-FA-B12 (FERREX 150 FORTE) 150-1-25 MG-MG-MCG CAPS Take 1 capsule by mouth daily. 90 capsule 4  . potassium chloride SA (K-DUR,KLOR-CON) 20 MEQ tablet Take 1 tablet (20 mEq total) by mouth daily. 30 tablet 0  . predniSONE (STERAPRED UNI-PAK 21 TAB) 5 MG (21) TBPK tablet Take 6 pills first day; 5 pills second day; 4 pills third day; 3 pills fourth day; 2 pills next day and 1 pill last day. 21 tablet 0  . tamsulosin (FLOMAX) 0.4 MG CAPS capsule Take 1 capsule (0.4 mg total) by mouth daily. 90 capsule 3  . UNABLE TO FIND Wrist brace, left  Dx: pain edema 1 each 0  . Vitamin D, Ergocalciferol, (DRISDOL) 50000 units CAPS capsule Take 50,000 Units by mouth once a week.  0   No current facility-administered medications for this visit.      Physical Exam  Blood pressure (!) 141/88, pulse 93, temperature 98 F (36.7 C), height 6' (1.829 m), weight 236 lb (107 kg).  Constitutional: overall normal hygiene, normal nutrition, well developed, normal grooming, normal body habitus. Assistive device:none  Musculoskeletal: gait and station Limp none, muscle tone and strength are normal, no tremors or atrophy is present.  .  Neurological: coordination overall normal.  Deep tendon reflex/nerve stretch intact.  Sensation normal.  Cranial nerves II-XII intact.   Skin:   Normal overall no scars, lesions, ulcers or rashes. No psoriasis.  Psychiatric: Alert and oriented x 3.  Recent memory intact, remote memory unclear.  Normal mood and affect. Well groomed.  Good eye  contact.  Cardiovascular: overall no swelling, no varicosities, no edema bilaterally, normal temperatures of the legs and arms, no clubbing, cyanosis and good capillary refill.  Lymphatic: palpation is normal.  He has diffuse swelling of the left hand and wrist. He has no increased warmth. He has diffuse tenderness.  Grip is reduced.  NV intact.  The patient has been educated about the nature of the problem(s) and counseled on treatment options.  The patient appeared to understand what I have discussed and is in agreement with it.  Encounter Diagnosis  Name Primary?  . Acute idiopathic gout of left hand Yes    PLAN Call if any problems.  Precautions discussed.  Continue current medications.   Return to clinic 1 week   Get serum uric acid.  Begin colchicine, prednisone dose pack.  I have reviewed the [place] web site prior to prescribing narcotic medicine for this patient.  Cock-up splint given.  Electronically Signed Sanjuana Kava, MD 7/25/20189:53 AM

## 2017-03-25 DIAGNOSIS — M10042 Idiopathic gout, left hand: Secondary | ICD-10-CM | POA: Diagnosis not present

## 2017-03-25 LAB — URIC ACID: Uric Acid, Serum: 4.9 mg/dL (ref 4.0–8.0)

## 2017-03-26 LAB — SEROTONIN SERUM: Serotonin, Serum: 85 ng/mL (ref 21–321)

## 2017-03-29 MED FILL — FERREX 150 FORTE CAPSULE: 150-1-25 | 90 days supply | Qty: 90 | Fill #1

## 2017-03-31 ENCOUNTER — Encounter: Payer: Self-pay | Admitting: Orthopaedic Surgery

## 2017-03-31 ENCOUNTER — Ambulatory Visit (INDEPENDENT_AMBULATORY_CARE_PROVIDER_SITE_OTHER): Payer: Medicare Other | Admitting: Orthopaedic Surgery

## 2017-03-31 VITALS — BP 138/74 | HR 80 | Temp 97.8°F | Ht 72.0 in | Wt 240.0 lb

## 2017-03-31 DIAGNOSIS — M10042 Idiopathic gout, left hand: Secondary | ICD-10-CM

## 2017-03-31 NOTE — Progress Notes (Signed)
Patient [name], male DOB:12-23-1950, 66 y.o. WEX:[id number]  Chief Complaint  Patient presents with  . Follow-up    lab results    HPI  [name] is a 66 y.o. male who has gout and pain of the left wrist and hand.  He is taking his Uloric now regularly.  His latest uric acid is 4.9, normal.  He has no swelling, no pain now.  He is pleased. He knows he needs to take his medicine regularly and daily. HPI  Body mass index is 32.55 kg/m.  ROS  Review of Systems  HENT: Negative for congestion.   Respiratory: Negative for cough and shortness of breath.   Cardiovascular: Negative for chest pain and leg swelling.  Endocrine: Positive for cold intolerance.  Musculoskeletal: Positive for arthralgias and joint swelling.  Allergic/Immunologic: Positive for environmental allergies.    Past Medical History:  Diagnosis Date  . Anemia FeDA: GASTRIC POLYPS, B12   TCS 2008 EGD 2009, 2008-HB 11.1 MCV 83.6 CR 1.22, 2009 FERRITIN 102-22  . B12 deficiency   . Cancer ([place])   . Carcinoid tumor determined by biopsy of stomach   . Chronic knee pain   . Diverticulosis 2008 LGIB  . DIVERTICULOSIS, COLON 08/15/2006   Qualifier: Diagnosis of  By: Jonna Munro MD, Roderic Scarce    . GERD (gastroesophageal reflux disease)   . Gout   . Hepatomegaly 2o to FATTY LIVER DZ  . History of alcohol abuse   . History of septic arthritis   . HTN (hypertension)   . Hypothyroidism   . Neuroendocrine tumor 09/25/2014  . PUD 11/30/2008   Qualifier: History of  By: Jonna Munro MD, Roderic Scarce    . Substance abuse     Past Surgical History:  Procedure Laterality Date  . BIOPSY  08/14/2014   Procedure: BIOPSY;  Surgeon: Danie Binder, MD;  Location: [facility];  Service: Endoscopy;;  . BIOPSY  03/12/2015   Procedure: BIOPSY;  Surgeon: Danie Binder, MD;  Location: [facility];  Service: Endoscopy;;  . BIOPSY  04/21/2016   Procedure: BIOPSY;  Surgeon: Danie Binder, MD;  [facility];  Service: Endoscopy;;  bx's  of antrum, body of stomach, fundus, and cardia  . CHOLECYSTECTOMY    . COLONOSCOPY  2008 [facility] DJ   LGIB 2o to TICS, prep good  . COLONOSCOPY WITH PROPOFOL N/A 08/14/2014   SLF: 1. Four large colon polyps removed. 2. The left colon is redundant 3. Moderate diverticulosis throughout teh entire examined colon  . COLONOSCOPY WITH PROPOFOL N/A 11/06/2014   SLF: 8 small polyps removed. One large pedunculated polyp removed from the ascending colon, tubulovillous and tubular adenomas. Next colonoscopy March 2019  . ESOPHAGOGASTRODUODENOSCOPY  12/08/2007   LFY:BOFB gastric polyps seen in the cardia and body of the stomach/Normal esophagus without evidence of Barrett's, mass, erosion/Normal duodenal bulb and second portion of the duodenum. Benign bx.  . ESOPHAGOGASTRODUODENOSCOPY (EGD) WITH PROPOFOL N/A 08/14/2014   SLF: 1. Heme postive stools due to gastric and colon polyps 2. Multiple gastric   . ESOPHAGOGASTRODUODENOSCOPY (EGD) WITH PROPOFOL N/A 03/12/2015   SLF: Multiple gastric nodules seen in gasric body/antrum. 2. Non-erosive gastritis ( inflammation) was found in the gastric antrum.   . ESOPHAGOGASTRODUODENOSCOPY (EGD) WITH PROPOFOL N/A 10/08/2015   Procedure: ESOPHAGOGASTRODUODENOSCOPY (EGD) WITH PROPOFOL;  Surgeon: Danie Binder, MD;  [facility];  Service: Endoscopy;  Laterality: N/A;  0945  . ESOPHAGOGASTRODUODENOSCOPY (EGD) WITH PROPOFOL N/A 04/21/2016   Procedure: ESOPHAGOGASTRODUODENOSCOPY (EGD) WITH PROPOFOL;  Surgeon: Danie Binder, MD;  [facility];  Service: Endoscopy;  Laterality: N/A;  730   . HAND SURGERY    . JOINT REPLACEMENT    . POLYPECTOMY  08/14/2014   Procedure: POLYPECTOMY;  Surgeon: Danie Binder, MD;  Location: [facility];  Service: Endoscopy;;  . POLYPECTOMY N/A 11/06/2014   Procedure: POLYPECTOMY;  Surgeon: Danie Binder, MD;  Location: [facility];  Service: Endoscopy;  Laterality: N/A;  Transverse Colon x3, Ascending Colon x2, Descending Colon x3  .  REPLACEMENT TOTAL KNEE BILATERAL    . UPPER GASTROINTESTINAL ENDOSCOPY  APR 2009   INFLAMED HYPERPLASTIC POLYPS, CHRONIC GASTRITIS    Family History  Problem Relation Age of Onset  . Diabetes Mother   . Renal Disease Mother        failure  . Diabetes Father        'old age'  . Hypertension Sister   . Hypertension Brother   . Diabetes Brother   . Diabetes Sister   . Diabetes Sister   . Diabetes Brother   . Hypertension Brother   . Diabetes Brother        right BKA  . Kidney failure Brother        on dialysis  . Kidney failure Brother        on dialysis  . Diabetes Brother        bilateral BKA  . Colon polyps Neg Hx   . Colon cancer Neg Hx   . Pancreatic disease Neg Hx     Social History Social History  Substance Use Topics  . Smoking status: Never Smoker  . Smokeless tobacco: Never Used     Comment: Quit x 10 years/ never smoked on regular basis  . Alcohol use 1.2 oz/week    2 Shots of liquor per week     Comment: drinks on weekends, gin/vodka 1/5th.    Allergies  Allergen Reactions  . Aspirin     REACTION: Diverticular Bleed    Current Outpatient Prescriptions  Medication Sig Dispense Refill  . acetaminophen (TYLENOL) 500 MG tablet Take 500 mg by mouth every 6 (six) hours as needed for moderate pain.    Marland Kitchen amLODipine (NORVASC) 10 MG tablet Take 1 tablet (10 mg total) by mouth daily. 90 tablet 3  . atorvastatin (LIPITOR) 20 MG tablet Take 1 tablet (20 mg total) by mouth daily. 90 tablet 3  . colchicine 0.6 MG tablet One by mouth three times a day for five days for gout pain. 15 tablet 3  . febuxostat (ULORIC) 40 MG tablet Take 40 mg by mouth daily.    Marland Kitchen gabapentin (NEURONTIN) 300 MG capsule Take 300 mg by mouth 2 (two) times daily.     Marland Kitchen HYDROcodone-acetaminophen (NORCO/VICODIN) 5-325 MG tablet One tablet every four hours as needed for acute pain.  Limit of five days per [place]. 30 tablet 0  . ibuprofen (ADVIL,MOTRIN) 800 MG tablet take one pill 2 - 3  times a day for wrist pain  Take with food 30 tablet 0  . levothyroxine (SYNTHROID) 200 MCG tablet Take 1 tablet (200 mcg total) by mouth daily before breakfast. 90 tablet 0  . omeprazole (PRILOSEC) 20 MG capsule Take 1 capsule (20 mg total) by mouth daily. 30 capsule 11  . Polysacchar Iron-FA-B12 (FERREX 150 FORTE) 150-1-25 MG-MG-MCG CAPS Take 1 capsule by mouth daily. 90 capsule 4  . potassium chloride SA (K-DUR,KLOR-CON) 20 MEQ tablet Take 1 tablet (20 mEq  total) by mouth daily. 30 tablet 0  . predniSONE (STERAPRED UNI-PAK 21 TAB) 5 MG (21) TBPK tablet Take 6 pills first day; 5 pills second day; 4 pills third day; 3 pills fourth day; 2 pills next day and 1 pill last day. 21 tablet 0  . tamsulosin (FLOMAX) 0.4 MG CAPS capsule Take 1 capsule (0.4 mg total) by mouth daily. 90 capsule 3  . UNABLE TO FIND Wrist brace, left  Dx: pain edema 1 each 0  . Vitamin D, Ergocalciferol, (DRISDOL) 50000 units CAPS capsule Take 50,000 Units by mouth once a week.  0   No current facility-administered medications for this visit.      Physical Exam  Blood pressure 138/74, pulse 80, temperature 97.8 F (36.6 C), height 6' (1.829 m), weight 240 lb (108.9 kg).  Constitutional: overall normal hygiene, normal nutrition, well developed, normal grooming, normal body habitus. Assistive device:none  Musculoskeletal: gait and station Limp none, muscle tone and strength are normal, no tremors or atrophy is present.  .  Neurological: coordination overall normal.  Deep tendon reflex/nerve stretch intact.  Sensation normal.  Cranial nerves II-XII intact.   Skin:   Normal overall no scars, lesions, ulcers or rashes. No psoriasis.  Psychiatric: Alert and oriented x 3.  Recent memory intact, remote memory unclear.  Normal mood and affect. Well groomed.  Good eye contact.  Cardiovascular: overall no swelling, no varicosities, no edema bilaterally, normal temperatures of the legs and arms, no clubbing, cyanosis and good  capillary refill.  Lymphatic: palpation is normal.  His left wrist has no pain, no redness and no swelling.  NV intact.  ROM is full.  The patient has been educated about the nature of the problem(s) and counseled on treatment options.  The patient appeared to understand what I have discussed and is in agreement with it.  Encounter Diagnosis  Name Primary?  . Acute idiopathic gout of left hand Yes    PLAN Call if any problems.  Precautions discussed.  Continue current medications.   Return to clinic 1 month   Electronically Signed Sanjuana Kava, MD 8/1/20188:25 AM

## 2017-04-07 NOTE — Progress Notes (Signed)
REVIEWED-NO ADDITIONAL RECOMMENDATIONS. 

## 2017-04-12 ENCOUNTER — Telehealth: Payer: Self-pay | Admitting: Orthopaedic Surgery

## 2017-04-12 NOTE — Telephone Encounter (Signed)
Patient requests refill:  HYDROcodone-acetaminophen (NORCO/VICODIN) 5-325 MG tablet 30 tabl

## 2017-04-13 MED ORDER — HYDROCODONE-ACETAMINOPHEN 5-325 MG PO TABS: ORAL_TABLET | ORAL | 0 refills | Status: DC

## 2017-04-28 ENCOUNTER — Ambulatory Visit (INDEPENDENT_AMBULATORY_CARE_PROVIDER_SITE_OTHER): Payer: Medicare Other | Admitting: Orthopaedic Surgery

## 2017-04-28 ENCOUNTER — Encounter: Payer: Self-pay | Admitting: Family Medicine

## 2017-04-28 ENCOUNTER — Ambulatory Visit (INDEPENDENT_AMBULATORY_CARE_PROVIDER_SITE_OTHER): Payer: Medicare Other | Admitting: Family Medicine

## 2017-04-28 VITALS — BP 134/76 | HR 54 | Temp 98.6°F | Ht 72.0 in | Wt 237.0 lb

## 2017-04-28 VITALS — BP 118/64 | HR 108 | Temp 96.9°F | Resp 16 | Ht 72.0 in | Wt 239.0 lb

## 2017-04-28 DIAGNOSIS — N400 Enlarged prostate without lower urinary tract symptoms: Secondary | ICD-10-CM | POA: Diagnosis not present

## 2017-04-28 DIAGNOSIS — R000 Tachycardia, unspecified: Secondary | ICD-10-CM | POA: Diagnosis not present

## 2017-04-28 DIAGNOSIS — E785 Hyperlipidemia, unspecified: Secondary | ICD-10-CM

## 2017-04-28 DIAGNOSIS — I10 Essential (primary) hypertension: Secondary | ICD-10-CM | POA: Diagnosis not present

## 2017-04-28 DIAGNOSIS — M10042 Idiopathic gout, left hand: Secondary | ICD-10-CM | POA: Diagnosis not present

## 2017-04-28 DIAGNOSIS — I4891 Unspecified atrial fibrillation: Secondary | ICD-10-CM

## 2017-04-28 MED ORDER — DOXYCYCLINE HYCLATE 100 MG PO TABS: 100.0000 mg | ORAL_TABLET | Freq: Every day | ORAL | 0 refills | Status: DC

## 2017-04-28 MED ORDER — HYDROCODONE-ACETAMINOPHEN 5-325 MG PO TABS: ORAL_TABLET | ORAL | 0 refills | Status: DC

## 2017-04-28 NOTE — Patient Instructions (Signed)
Need to see cardiologist for the fast heart beats  No change in medicines  See me in 3 months

## 2017-04-28 NOTE — Progress Notes (Signed)
Patient QA:[name], male DOB:[birth year], 66 y.o. QQI:[id number]  Chief Complaint  Patient presents with  . Follow-up    Gout left hand    HPI  [name] is a 66 y.o. male who has gout of the left hand.  He says he takes his gout medicine most every day but not every day.  I have told him to do it daily.  He has had some swelling of the left hand and wrist.  He has no new trauma. HPI  Body mass index is 32.14 kg/m.  ROS  Review of Systems  HENT: Negative for congestion.   Respiratory: Negative for cough and shortness of breath.   Cardiovascular: Negative for chest pain and leg swelling.  Endocrine: Positive for cold intolerance.  Musculoskeletal: Positive for arthralgias and joint swelling.  Allergic/Immunologic: Positive for environmental allergies.    Past Medical History:  Diagnosis Date  . Anemia FeDA: GASTRIC POLYPS, B12   TCS 2008 EGD 2009, 2008-HB 11.1 MCV 83.6 CR 1.22, 2009 FERRITIN 102-22  . B12 deficiency   . Cancer ([place])   . Carcinoid tumor determined by biopsy of stomach   . Chronic knee pain   . Diverticulosis 2008 LGIB  . DIVERTICULOSIS, COLON 08/15/2006   Qualifier: Diagnosis of  By: Jonna Munro MD, Roderic Scarce    . GERD (gastroesophageal reflux disease)   . Gout   . Hepatomegaly 2o to FATTY LIVER DZ  . History of alcohol abuse   . History of septic arthritis   . HTN (hypertension)   . Hypothyroidism   . Neuroendocrine tumor 09/25/2014  . PUD 11/30/2008   Qualifier: History of  By: Jonna Munro MD, Roderic Scarce    . Substance abuse     Past Surgical History:  Procedure Laterality Date  . BIOPSY  08/14/2014   Procedure: BIOPSY;  Surgeon: Danie Binder, MD;  Location: [facility];  Service: Endoscopy;;  . BIOPSY  03/12/2015   Procedure: BIOPSY;  Surgeon: Danie Binder, MD;  Location: [facility];  Service: Endoscopy;;  . BIOPSY  04/21/2016   Procedure: BIOPSY;  Surgeon: Danie Binder, MD;  [facility];  Service: Endoscopy;;  bx's of antrum, body of  stomach, fundus, and cardia  . CHOLECYSTECTOMY    . COLONOSCOPY  2008 [facility] DJ   LGIB 2o to TICS, prep good  . COLONOSCOPY WITH PROPOFOL N/A 08/14/2014   SLF: 1. Four large colon polyps removed. 2. The left colon is redundant 3. Moderate diverticulosis throughout teh entire examined colon  . COLONOSCOPY WITH PROPOFOL N/A 11/06/2014   SLF: 8 small polyps removed. One large pedunculated polyp removed from the ascending colon, tubulovillous and tubular adenomas. Next colonoscopy March 2019  . ESOPHAGOGASTRODUODENOSCOPY  12/08/2007   HER:DEYC gastric polyps seen in the cardia and body of the stomach/Normal esophagus without evidence of Barrett's, mass, erosion/Normal duodenal bulb and second portion of the duodenum. Benign bx.  . ESOPHAGOGASTRODUODENOSCOPY (EGD) WITH PROPOFOL N/A 08/14/2014   SLF: 1. Heme postive stools due to gastric and colon polyps 2. Multiple gastric   . ESOPHAGOGASTRODUODENOSCOPY (EGD) WITH PROPOFOL N/A 03/12/2015   SLF: Multiple gastric nodules seen in gasric body/antrum. 2. Non-erosive gastritis ( inflammation) was found in the gastric antrum.   . ESOPHAGOGASTRODUODENOSCOPY (EGD) WITH PROPOFOL N/A 10/08/2015   Procedure: ESOPHAGOGASTRODUODENOSCOPY (EGD) WITH PROPOFOL;  Surgeon: Danie Binder, MD;  [facility];  Service: Endoscopy;  Laterality: N/A;  0945  . ESOPHAGOGASTRODUODENOSCOPY (EGD) WITH PROPOFOL N/A 04/21/2016   Procedure: ESOPHAGOGASTRODUODENOSCOPY (EGD) WITH PROPOFOL;  Surgeon: Danie Binder, MD;  [facility];  Service: Endoscopy;  Laterality: N/A;  730   . HAND SURGERY    . JOINT REPLACEMENT    . POLYPECTOMY  08/14/2014   Procedure: POLYPECTOMY;  Surgeon: Danie Binder, MD;  Location: [facility];  Service: Endoscopy;;  . POLYPECTOMY N/A 11/06/2014   Procedure: POLYPECTOMY;  Surgeon: Danie Binder, MD;  Location: [facility];  Service: Endoscopy;  Laterality: N/A;  Transverse Colon x3, Ascending Colon x2, Descending Colon x3  . REPLACEMENT TOTAL KNEE  BILATERAL    . UPPER GASTROINTESTINAL ENDOSCOPY  APR 2009   INFLAMED HYPERPLASTIC POLYPS, CHRONIC GASTRITIS    Family History  Problem Relation Age of Onset  . Diabetes Mother   . Renal Disease Mother        failure  . Diabetes Father        'old age'  . Hypertension Sister   . Hypertension Brother   . Diabetes Brother   . Diabetes Sister   . Diabetes Sister   . Diabetes Brother   . Hypertension Brother   . Diabetes Brother        right BKA  . Kidney failure Brother        on dialysis  . Kidney failure Brother        on dialysis  . Diabetes Brother        bilateral BKA  . Colon polyps Neg Hx   . Colon cancer Neg Hx   . Pancreatic disease Neg Hx     Social History Social History  Substance Use Topics  . Smoking status: Never Smoker  . Smokeless tobacco: Never Used     Comment: Quit x 10 years/ never smoked on regular basis  . Alcohol use 1.2 oz/week    2 Shots of liquor per week     Comment: drinks on weekends, gin/vodka 1/5th.    Allergies  Allergen Reactions  . Aspirin     REACTION: Diverticular Bleed    Current Outpatient Prescriptions  Medication Sig Dispense Refill  . acetaminophen (TYLENOL) 500 MG tablet Take 500 mg by mouth every 6 (six) hours as needed for moderate pain.    Marland Kitchen amLODipine (NORVASC) 10 MG tablet Take 1 tablet (10 mg total) by mouth daily. 90 tablet 3  . atorvastatin (LIPITOR) 20 MG tablet Take 1 tablet (20 mg total) by mouth daily. 90 tablet 3  . colchicine 0.6 MG tablet One by mouth three times a day for five days for gout pain. 15 tablet 3  . doxycycline (VIBRA-TABS) 100 MG tablet Take 1 tablet (100 mg total) by mouth daily. 20 tablet 0  . febuxostat (ULORIC) 40 MG tablet Take 40 mg by mouth daily.    Marland Kitchen gabapentin (NEURONTIN) 300 MG capsule Take 300 mg by mouth 2 (two) times daily.     Marland Kitchen HYDROcodone-acetaminophen (NORCO/VICODIN) 5-325 MG tablet One tablet every six hours as needed for acute pain.  Must last 14 days. 30 tablet 0  .  ibuprofen (ADVIL,MOTRIN) 800 MG tablet take one pill 2 - 3 times a day for wrist pain  Take with food 30 tablet 0  . levothyroxine (SYNTHROID) 200 MCG tablet Take 1 tablet (200 mcg total) by mouth daily before breakfast. 90 tablet 0  . omeprazole (PRILOSEC) 20 MG capsule Take 1 capsule (20 mg total) by mouth daily. 30 capsule 11  . Polysacchar Iron-FA-B12 (FERREX 150 FORTE) 150-1-25 MG-MG-MCG CAPS Take 1 capsule by mouth daily. 90 capsule  4  . potassium chloride SA (K-DUR,KLOR-CON) 20 MEQ tablet Take 1 tablet (20 mEq total) by mouth daily. 30 tablet 0  . tamsulosin (FLOMAX) 0.4 MG CAPS capsule Take 1 capsule (0.4 mg total) by mouth daily. 90 capsule 3  . Vitamin D, Ergocalciferol, (DRISDOL) 50000 units CAPS capsule Take 50,000 Units by mouth once a week.  0   No current facility-administered medications for this visit.      Physical Exam  Blood pressure 134/76, pulse (!) 54, temperature 98.6 F (37 C), height 6' (1.829 m), weight 237 lb (107.5 kg).  Constitutional: overall normal hygiene, normal nutrition, well developed, normal grooming, normal body habitus. Assistive device:none  Musculoskeletal: gait and station Limp none, muscle tone and strength are normal, no tremors or atrophy is present.  .  Neurological: coordination overall normal.  Deep tendon reflex/nerve stretch intact.  Sensation normal.  Cranial nerves II-XII intact.   Skin:   Normal overall no scars, lesions, ulcers or rashes. No psoriasis.  Psychiatric: Alert and oriented x 3.  Recent memory intact, remote memory unclear.  Normal mood and affect. Well groomed.  Good eye contact.  Cardiovascular: overall no swelling, no varicosities, no edema bilaterally, normal temperatures of the legs and arms, no clubbing, cyanosis and good capillary refill.  Lymphatic: palpation is normal.  His left hand has some slight swelling and also of the left wrist.  He has normal NV status.  Grip is good but slightly tender to do.  The  patient has been educated about the nature of the problem(s) and counseled on treatment options.  The patient appeared to understand what I have discussed and is in agreement with it.  Encounter Diagnosis  Name Primary?  . Acute idiopathic gout of left hand Yes    PLAN Call if any problems.  Precautions discussed.  Continue current medications.   Return to clinic 1 month   I have reviewed the [employer] web site prior to prescribing narcotic medicine for this patient. Electronically Signed Sanjuana Kava, MD 8/29/20189:54 AM

## 2017-04-28 NOTE — Progress Notes (Signed)
Chief Complaint  Patient presents with  . Follow-up    3 month  routine follow up Still bothered - mildly- by the arthritis in his wrists and knees BP is good No chest pain or DOE No problems with appetite or bowels,  NO heartburn Denies any current urinary symptoms Refuses flu shot- explained and recommended to him DENIES any sensation of rapid or irregular heartbeat  Patient Active Problem List   Diagnosis Date Noted  . Hyperlipidemia 10/23/2016  . Mild intellectual disabilities (CODE) 09/29/2016  . Personal history of gout 09/22/2016  . Literacy level of illiterate 09/22/2016  . History of bilateral knee replacement 09/22/2016  . Carcinoid tumor determined by biopsy of stomach 04/10/2016  . Tubulovillous adenoma of large intestine 09/12/2015  . Neuroendocrine tumor 09/25/2014  . Elevated LFTs 07/17/2014  . Iron deficiency anemia 07/29/2010  . Fatty liver 07/29/2010  . ABUSE, ALCOHOL, UNSPECIFIED 11/30/2008  . Osteoarthrosis involving lower leg 08/20/2008  . Essential hypertension 10/20/2006  . B12 deficiency 08/15/2006  . GERD 08/15/2006  . IBS 08/15/2006  . BPH without obstruction/lower urinary tract symptoms 08/15/2006  . OSTEOARTHRITIS 08/15/2006  . DEGENERATION, DISC NOS 08/15/2006    Outpatient Encounter Prescriptions as of 04/28/2017  Medication Sig  . acetaminophen (TYLENOL) 500 MG tablet Take 500 mg by mouth every 6 (six) hours as needed for moderate pain.  Marland Kitchen amLODipine (NORVASC) 10 MG tablet Take 1 tablet (10 mg total) by mouth daily.  Marland Kitchen atorvastatin (LIPITOR) 20 MG tablet Take 1 tablet (20 mg total) by mouth daily.  . colchicine 0.6 MG tablet One by mouth three times a day for five days for gout pain.  . febuxostat (ULORIC) 40 MG tablet Take 40 mg by mouth daily.  Marland Kitchen gabapentin (NEURONTIN) 300 MG capsule Take 300 mg by mouth 2 (two) times daily.   Marland Kitchen ibuprofen (ADVIL,MOTRIN) 800 MG tablet take one pill 2 - 3 times a day for wrist pain  Take with food  .  levothyroxine (SYNTHROID) 200 MCG tablet Take 1 tablet (200 mcg total) by mouth daily before breakfast.  . omeprazole (PRILOSEC) 20 MG capsule Take 1 capsule (20 mg total) by mouth daily.  . Polysacchar Iron-FA-B12 (FERREX 150 FORTE) 150-1-25 MG-MG-MCG CAPS Take 1 capsule by mouth daily.  . potassium chloride SA (K-DUR,KLOR-CON) 20 MEQ tablet Take 1 tablet (20 mEq total) by mouth daily.  . tamsulosin (FLOMAX) 0.4 MG CAPS capsule Take 1 capsule (0.4 mg total) by mouth daily.  . Vitamin D, Ergocalciferol, (DRISDOL) 50000 units CAPS capsule Take 50,000 Units by mouth once a week.  . doxycycline (VIBRA-TABS) 100 MG tablet Take 1 tablet (100 mg total) by mouth daily.   No facility-administered encounter medications on file as of 04/28/2017.     Allergies  Allergen Reactions  . Aspirin     REACTION: Diverticular Bleed    Review of Systems  Constitutional: Negative for activity change, appetite change, chills, fever and unexpected weight change.  HENT: Negative.  Negative for dental problem.   Eyes: Negative.  Negative for visual disturbance.  Respiratory: Negative.  Negative for cough and shortness of breath.   Cardiovascular: Negative for chest pain, palpitations and leg swelling.  Gastrointestinal: Negative.  Negative for diarrhea, nausea and vomiting.  Genitourinary: Negative for decreased urine volume and frequency.  Musculoskeletal: Positive for arthralgias and joint swelling.  Skin: Negative for color change and rash.  Neurological: Negative for dizziness and light-headedness.  Psychiatric/Behavioral: Negative for decreased concentration and dysphoric mood. The patient  is not nervous/anxious.     BP 118/64 (BP Location: Right Arm, Patient Position: Sitting, Cuff Size: Large)   Pulse (!) 108   Temp (!) 96.9 F (36.1 C) (Temporal)   Resp 16   Ht 6' (1.829 m)   Wt 239 lb (108.4 kg)   SpO2 96%   BMI 32.41 kg/m   Physical Exam  Constitutional: He appears well-developed and  well-nourished.  Obese. Poor fund of knowledge.  HENT:  Head: Normocephalic and atraumatic.  Mouth/Throat: Oropharynx is clear and moist.  Eyes: Pupils are equal, round, and reactive to light. Conjunctivae are normal.  Neck: Normal range of motion.  Cardiovascular: Normal rate, regular rhythm and normal heart sounds.   Occasional ectopy  Pulmonary/Chest: Effort normal and breath sounds normal. He has no rales.  Abdominal: Soft. Bowel sounds are normal. He exhibits no distension.  Musculoskeletal:  Mild swelling and deformity both wrists.  Lymphadenopathy:    He has no cervical adenopathy.  Neurological: He is alert. He displays normal reflexes.  Unsteady gait  Skin: Skin is dry. No erythema.  Psychiatric: He has a normal mood and affect.  Poor knowledge    ASSESSMENT/PLAN:  1. Sinus tachycardia On EKG he has runs of A fib - EKG 12-Lead  2. Essential hypertension controlled  3. BPH without obstruction/lower urinary tract symptoms asymptomatic  4. Hyperlipidemia, unspecified hyperlipidemia type controlled  5. Atrial fibrillation, new onset [facility])  - Ambulatory referral to Cardiology   Patient Instructions  Need to see cardiologist for the fast heart beats  No change in medicines  See me in 3 months   Raylene Everts, MD

## 2017-05-17 ENCOUNTER — Encounter: Payer: Self-pay | Admitting: Cardiology

## 2017-05-17 ENCOUNTER — Ambulatory Visit (INDEPENDENT_AMBULATORY_CARE_PROVIDER_SITE_OTHER): Payer: Medicare Other | Admitting: Cardiology

## 2017-05-17 VITALS — BP 104/60 | HR 94 | Ht 72.0 in | Wt 244.0 lb

## 2017-05-17 DIAGNOSIS — E039 Hypothyroidism, unspecified: Secondary | ICD-10-CM | POA: Diagnosis not present

## 2017-05-17 DIAGNOSIS — I10 Essential (primary) hypertension: Secondary | ICD-10-CM | POA: Diagnosis not present

## 2017-05-17 DIAGNOSIS — F1011 Alcohol abuse, in remission: Secondary | ICD-10-CM

## 2017-05-17 DIAGNOSIS — I471 Supraventricular tachycardia: Secondary | ICD-10-CM | POA: Diagnosis not present

## 2017-05-17 DIAGNOSIS — Z87898 Personal history of other specified conditions: Secondary | ICD-10-CM | POA: Diagnosis not present

## 2017-05-17 MED ORDER — AMLODIPINE BESYLATE 5 MG PO TABS: 5.0000 mg | ORAL_TABLET | Freq: Every day | ORAL | 3 refills | Status: DC

## 2017-05-17 MED ORDER — METOPROLOL SUCCINATE ER 25 MG PO TB24: 25.0000 mg | ORAL_TABLET | Freq: Every day | ORAL | 3 refills | Status: DC

## 2017-05-17 NOTE — Progress Notes (Signed)
Cardiology Office Note  Date: 05/17/2017   ID: [name], DOB 01/31/1951, MRN [id number]  PCP: Raylene Everts, MD  Consulting Cardiologist: Rozann Lesches, MD   Chief Complaint  Patient presents with  . Suspected atrial fibrillation    History of Present Illness: [name] is a 66 y.o. male referred for cardiology consultation by Dr. Meda Coffee for the evaluation of possible atrial fibrillation. I reviewed the chart and recent office notes. He presents without any specific complaints of chest pain or palpitations. He has had no sudden lightheadedness or syncope.  I personally reviewed his recent tracings from August 29 which show sinus rhythm and sinus tachycardia with frequent PACs, PVCs including couplets, and bursts of what looks to be a regular narrow complex tachycardia, possibly reentrant SVT rather than definitive atrial fibrillation.  We reviewed his medications. He reports compliance. He is on Norvasc for blood pressure control. Blood pressure is low normal today.  He does not report any known history of cardiomyopathy or previous he diagnosed cardiac arrhythmia.  Past Medical History:  Diagnosis Date  . Anemia    FeDA: GASTRIC POLYPS, B12; TCS 2008 EGD 2009, 2008-HB 11.1 MCV 83.6 CR 1.22, 2009 FERRITIN 102-22  . B12 deficiency   . Carcinoid tumor determined by biopsy of stomach 09/25/2014  . Chronic knee pain   . Diverticulosis of colon    Lower GI bleed 2008  . Essential hypertension   . GERD (gastroesophageal reflux disease)   . Gout   . Hepatomegaly    Hepatic steatosis  . History of alcohol abuse   . History of septic arthritis   . Hypothyroidism   . PUD   . Substance abuse     Past Surgical History:  Procedure Laterality Date  . BIOPSY  08/14/2014   Procedure: BIOPSY;  Surgeon: Danie Binder, MD;  Location: [facility];  Service: Endoscopy;;  . BIOPSY  03/12/2015   Procedure: BIOPSY;  Surgeon: Danie Binder, MD;  Location: [facility];  Service:  Endoscopy;;  . BIOPSY  04/21/2016   Procedure: BIOPSY;  Surgeon: Danie Binder, MD;  [facility];  Service: Endoscopy;;  bx's of antrum, body of stomach, fundus, and cardia  . CHOLECYSTECTOMY    . COLONOSCOPY  2008 [facility] DJ   LGIB 2o to TICS, prep good  . COLONOSCOPY WITH PROPOFOL N/A 08/14/2014   SLF: 1. Four large colon polyps removed. 2. The left colon is redundant 3. Moderate diverticulosis throughout teh entire examined colon  . COLONOSCOPY WITH PROPOFOL N/A 11/06/2014   SLF: 8 small polyps removed. One large pedunculated polyp removed from the ascending colon, tubulovillous and tubular adenomas. Next colonoscopy March 2019  . ESOPHAGOGASTRODUODENOSCOPY  12/08/2007   GYF:VCBS gastric polyps seen in the cardia and body of the stomach/Normal esophagus without evidence of Barrett's, mass, erosion/Normal duodenal bulb and second portion of the duodenum. Benign bx.  . ESOPHAGOGASTRODUODENOSCOPY (EGD) WITH PROPOFOL N/A 08/14/2014   SLF: 1. Heme postive stools due to gastric and colon polyps 2. Multiple gastric   . ESOPHAGOGASTRODUODENOSCOPY (EGD) WITH PROPOFOL N/A 03/12/2015   SLF: Multiple gastric nodules seen in gasric body/antrum. 2. Non-erosive gastritis ( inflammation) was found in the gastric antrum.   . ESOPHAGOGASTRODUODENOSCOPY (EGD) WITH PROPOFOL N/A 10/08/2015   Procedure: ESOPHAGOGASTRODUODENOSCOPY (EGD) WITH PROPOFOL;  Surgeon: Danie Binder, MD;  [facility];  Service: Endoscopy;  Laterality: N/A;  0945  . ESOPHAGOGASTRODUODENOSCOPY (EGD) WITH PROPOFOL N/A 04/21/2016   Procedure: ESOPHAGOGASTRODUODENOSCOPY (EGD) WITH  PROPOFOL;  Surgeon: Danie Binder, MD;  [facility];  Service: Endoscopy;  Laterality: N/A;  730   . HAND SURGERY    . JOINT REPLACEMENT    . POLYPECTOMY  08/14/2014   Procedure: POLYPECTOMY;  Surgeon: Danie Binder, MD;  Location: [facility];  Service: Endoscopy;;  . POLYPECTOMY N/A 11/06/2014   Procedure: POLYPECTOMY;  Surgeon: Danie Binder, MD;  Location: [facility];  Service: Endoscopy;  Laterality: N/A;  Transverse Colon x3, Ascending Colon x2, Descending Colon x3  . REPLACEMENT TOTAL KNEE BILATERAL    . UPPER GASTROINTESTINAL ENDOSCOPY  APR 2009   INFLAMED HYPERPLASTIC POLYPS, CHRONIC GASTRITIS    Current Outpatient Prescriptions  Medication Sig Dispense Refill  . acetaminophen (TYLENOL) 500 MG tablet Take 500 mg by mouth every 6 (six) hours as needed for moderate pain.    Marland Kitchen atorvastatin (LIPITOR) 20 MG tablet Take 1 tablet (20 mg total) by mouth daily. 90 tablet 3  . colchicine 0.6 MG tablet One by mouth three times a day for five days for gout pain. 15 tablet 3  . febuxostat (ULORIC) 40 MG tablet Take 40 mg by mouth daily.    Marland Kitchen gabapentin (NEURONTIN) 300 MG capsule Take 300 mg by mouth 2 (two) times daily.     Marland Kitchen HYDROcodone-acetaminophen (NORCO/VICODIN) 5-325 MG tablet One tablet every six hours as needed for acute pain.  Must last 14 days. 30 tablet 0  . ibuprofen (ADVIL,MOTRIN) 800 MG tablet take one pill 2 - 3 times a day for wrist pain  Take with food 30 tablet 0  . levothyroxine (SYNTHROID) 200 MCG tablet Take 1 tablet (200 mcg total) by mouth daily before breakfast. 90 tablet 0  . omeprazole (PRILOSEC) 20 MG capsule Take 1 capsule (20 mg total) by mouth daily. 30 capsule 11  . Polysacchar Iron-FA-B12 (FERREX 150 FORTE) 150-1-25 MG-MG-MCG CAPS Take 1 capsule by mouth daily. 90 capsule 4  . potassium chloride SA (K-DUR,KLOR-CON) 20 MEQ tablet Take 1 tablet (20 mEq total) by mouth daily. 30 tablet 0  . tamsulosin (FLOMAX) 0.4 MG CAPS capsule Take 1 capsule (0.4 mg total) by mouth daily. 90 capsule 3  . Vitamin D, Ergocalciferol, (DRISDOL) 50000 units CAPS capsule Take 50,000 Units by mouth once a week.  0  . amLODipine (NORVASC) 5 MG tablet Take 1 tablet (5 mg total) by mouth daily. 90 tablet 3  . metoprolol succinate (TOPROL XL) 25 MG 24 hr tablet Take 1 tablet (25 mg total) by mouth daily. 90 tablet 3   No current  facility-administered medications for this visit.    Allergies:  Aspirin   Social History: The patient  reports that he has never smoked. He has never used smokeless tobacco. He reports that he drinks about 1.2 oz of alcohol per week . He reports that he does not use drugs.   Family History: The patient's family history includes Diabetes in his brother, brother, brother, brother, father, mother, sister, and sister; Hypertension in his brother, brother, and sister; Kidney failure in his brother and brother; Renal Disease in his mother.   ROS:  Please see the history of present illness. Otherwise, complete review of systems is positive for occasional gout symptoms. No fevers or chills. Stable appetite. All other systems are reviewed and negative.   Physical Exam: VS:  BP 104/60   Pulse 94   Ht 6' (1.829 m)   Wt 244 lb (110.7 kg)   SpO2 96%   BMI 33.09  kg/m , BMI Body mass index is 33.09 kg/m.  Wt Readings from Last 3 Encounters:  05/17/17 244 lb (110.7 kg)  04/28/17 237 lb (107.5 kg)  04/28/17 239 lb (108.4 kg)    General: Obese male, appears comfortable at rest. HEENT: Conjunctiva and lids normal, oropharynx clear with poor dentition. Neck: Supple, no elevated JVP or carotid bruits, no thyromegaly. Lungs: Clear to auscultation, nonlabored breathing at rest. Cardiac: Regular rate and rhythm, no S3, soft systolic murmur, no pericardial rub. Abdomen: Soft, nontender, bowel sounds present, no guarding or rebound. Extremities: Mild ankle edema, distal pulses 2+. Skin: Warm and dry. Musculoskeletal: No kyphosis. Neuropsychiatric: Alert and oriented x3, affect grossly appropriate.  ECG: I personally reviewed the tracing from 02/26/2017 which showed sinus rhythm with borderline increased voltage, nonspecific T-wave changes, and PVC.  Recent Labwork: 01/20/2017: TSH 2.81 03/22/2017: ALT 15; AST 20; BUN 17; Creatinine, Ser 1.11; Hemoglobin 12.0; Platelets 164; Potassium 3.3; Sodium 137       Component Value Date/Time   CHOL 150 01/20/2017 0926   TRIG 154 (H) 01/20/2017 0926   HDL 29 (L) 01/20/2017 0926   CHOLHDL 5.2 (H) 01/20/2017 0926   VLDL 31 (H) 01/20/2017 0926   LDLCALC 90 01/20/2017 0926    Other Studies Reviewed Today:  Abdominal and Chest x-ray 02/18/2017: FINDINGS: The lungs are well-expanded. There is no focal infiltrate. There is no pleural effusion. The heart is top-normal in size. The pulmonary vascularity is normal. The mediastinum is normal in width. There is calcification in the wall of the aortic arch.  Within the abdomen the bowel gas pattern is normal. No abnormal soft tissue calcifications are observed. There surgical clips in the gallbladder fossa and in the left upper quadrant. The bony structures exhibit no acute abnormalities. There are degenerative disc changes in the lower lumbar spine.  IMPRESSION: There is no acute cardiopulmonary abnormality. No acute intra-abdominal abnormality is observed either.  Further evaluation with abdominal and pelvic CT scanning may be a useful next imaging step.  Assessment and Plan:  1. Recently documented SVT, not overly symptomatic in terms of palpitations however and no history of syncope. Rhythm looks more like an SVT than atrial fibrillation given its regularity. He also had frequent PACs and PVCs although today's heart rate is regular. Plan is to reduce Norvasc to 5 mg daily and initiate Toprol-XL beginning at 25 mg daily. May need to further uptitrate his dose depending on heart rate response. Also obtain echocardiogram to assess cardiac structure and function.  2. Essential hypertension. Continue Norvasc at reduced dose given initiation of Toprol-XL.  3. Hypothyroidism, on Synthroid. TSH was normal based on lab work earlier this year.  4. History of alcohol abuse, reports less alcohol use recently, usually on the weekends. Could also be contributing to cardiac arrhythmia.  Current medicines  were reviewed with the patient today.   Orders Placed This Encounter  Procedures  . ECHOCARDIOGRAM COMPLETE    Disposition: Follow-up in 6 weeks.  Signed, Satira Sark, MD, [facility] 05/17/2017 1:35 PM    [facility] at [facility] [address], [place], [place] [address] Phone: [phone]; Fax: [phone]

## 2017-05-17 NOTE — Patient Instructions (Signed)
Your physician recommends that you schedule a follow-up appointment in: 6 weeks with Dr.McDowell  Your physician has requested that you have an echocardiogram. Echocardiography is a painless test that uses sound waves to create images of your heart. It provides your doctor with information about the size and shape of your heart and how well your heart's chambers and valves are working. This procedure takes approximately one hour. There are no restrictions for this procedure.     DECREASE Norvasc to 5 mg daily   START Toprol XL 25 mg daily       No lab work  Or tests ordered today.      Thank you for choosing [facility] !

## 2017-05-18 ENCOUNTER — Other Ambulatory Visit: Payer: Self-pay | Admitting: Family Medicine

## 2017-05-19 NOTE — Telephone Encounter (Signed)
He seems to be getting from Cards MD. Are you now filling for him?

## 2017-05-25 ENCOUNTER — Ambulatory Visit (INDEPENDENT_AMBULATORY_CARE_PROVIDER_SITE_OTHER): Payer: Medicare Other | Admitting: Orthopaedic Surgery

## 2017-05-25 ENCOUNTER — Encounter: Payer: Self-pay | Admitting: Orthopaedic Surgery

## 2017-05-25 VITALS — BP 118/75 | HR 101 | Resp 18 | Ht 72.0 in | Wt 242.0 lb

## 2017-05-25 DIAGNOSIS — M10042 Idiopathic gout, left hand: Secondary | ICD-10-CM

## 2017-05-25 DIAGNOSIS — G894 Chronic pain syndrome: Secondary | ICD-10-CM | POA: Diagnosis not present

## 2017-05-25 LAB — URIC ACID: Uric Acid, Serum: 5.2 mg/dL (ref 4.0–8.0)

## 2017-05-25 MED ORDER — HYDROCODONE-ACETAMINOPHEN 5-325 MG PO TABS
ORAL_TABLET | ORAL | 0 refills | Status: DC
Start: 2017-05-25 — End: 2017-06-28

## 2017-05-25 NOTE — Progress Notes (Signed)
Patient [name], male DOB:1951/05/21, 66 y.o. JQB:[id number]  Chief Complaint  Patient presents with  . Hand Pain    left greater than right gout pain /flare     HPI  [name] is a 66 y.o. male who has gout and left hand swelling.  He is taking his gout medicine now he says daily.  He still has some pain and swelling.  I will check his uric acid level.  He is to continue the Uloric. HPI  Body mass index is 32.82 kg/m.  ROS  Review of Systems  HENT: Negative for congestion.   Respiratory: Negative for cough and shortness of breath.   Cardiovascular: Negative for chest pain and leg swelling.  Endocrine: Positive for cold intolerance.  Musculoskeletal: Positive for arthralgias and joint swelling.  Allergic/Immunologic: Positive for environmental allergies.    Past Medical History:  Diagnosis Date  . Anemia    FeDA: GASTRIC POLYPS, B12; TCS 2008 EGD 2009, 2008-HB 11.1 MCV 83.6 CR 1.22, 2009 FERRITIN 102-22  . B12 deficiency   . Carcinoid tumor determined by biopsy of stomach 09/25/2014  . Chronic knee pain   . Diverticulosis of colon    Lower GI bleed 2008  . Essential hypertension   . GERD (gastroesophageal reflux disease)   . Gout   . Hepatomegaly    Hepatic steatosis  . History of alcohol abuse   . History of septic arthritis   . Hypothyroidism   . PUD   . Substance abuse     Past Surgical History:  Procedure Laterality Date  . BIOPSY  08/14/2014   Procedure: BIOPSY;  Surgeon: Danie Binder, MD;  Location: [facility];  Service: Endoscopy;;  . BIOPSY  03/12/2015   Procedure: BIOPSY;  Surgeon: Danie Binder, MD;  Location: [facility];  Service: Endoscopy;;  . BIOPSY  04/21/2016   Procedure: BIOPSY;  Surgeon: Danie Binder, MD;  [facility];  Service: Endoscopy;;  bx's of antrum, body of stomach, fundus, and cardia  . CHOLECYSTECTOMY    . COLONOSCOPY  2008 [facility] DJ   LGIB 2o to TICS, prep good  . COLONOSCOPY WITH PROPOFOL N/A 08/14/2014   SLF: 1.  Four large colon polyps removed. 2. The left colon is redundant 3. Moderate diverticulosis throughout teh entire examined colon  . COLONOSCOPY WITH PROPOFOL N/A 11/06/2014   SLF: 8 small polyps removed. One large pedunculated polyp removed from the ascending colon, tubulovillous and tubular adenomas. Next colonoscopy March 2019  . ESOPHAGOGASTRODUODENOSCOPY  12/08/2007   IOX:BDZH gastric polyps seen in the cardia and body of the stomach/Normal esophagus without evidence of Barrett's, mass, erosion/Normal duodenal bulb and second portion of the duodenum. Benign bx.  . ESOPHAGOGASTRODUODENOSCOPY (EGD) WITH PROPOFOL N/A 08/14/2014   SLF: 1. Heme postive stools due to gastric and colon polyps 2. Multiple gastric   . ESOPHAGOGASTRODUODENOSCOPY (EGD) WITH PROPOFOL N/A 03/12/2015   SLF: Multiple gastric nodules seen in gasric body/antrum. 2. Non-erosive gastritis ( inflammation) was found in the gastric antrum.   . ESOPHAGOGASTRODUODENOSCOPY (EGD) WITH PROPOFOL N/A 10/08/2015   Procedure: ESOPHAGOGASTRODUODENOSCOPY (EGD) WITH PROPOFOL;  Surgeon: Danie Binder, MD;  [facility];  Service: Endoscopy;  Laterality: N/A;  0945  . ESOPHAGOGASTRODUODENOSCOPY (EGD) WITH PROPOFOL N/A 04/21/2016   Procedure: ESOPHAGOGASTRODUODENOSCOPY (EGD) WITH PROPOFOL;  Surgeon: Danie Binder, MD;  [facility];  Service: Endoscopy;  Laterality: N/A;  730   . HAND SURGERY    . JOINT REPLACEMENT    .  POLYPECTOMY  08/14/2014   Procedure: POLYPECTOMY;  Surgeon: Danie Binder, MD;  Location: [facility];  Service: Endoscopy;;  . POLYPECTOMY N/A 11/06/2014   Procedure: POLYPECTOMY;  Surgeon: Danie Binder, MD;  Location: [facility];  Service: Endoscopy;  Laterality: N/A;  Transverse Colon x3, Ascending Colon x2, Descending Colon x3  . REPLACEMENT TOTAL KNEE BILATERAL    . UPPER GASTROINTESTINAL ENDOSCOPY  APR 2009   INFLAMED HYPERPLASTIC POLYPS, CHRONIC GASTRITIS    Family History  Problem Relation Age of Onset  .  Diabetes Mother   . Renal Disease Mother        failure  . Diabetes Father        'old age'  . Hypertension Sister   . Hypertension Brother   . Diabetes Brother   . Diabetes Sister   . Diabetes Sister   . Diabetes Brother   . Hypertension Brother   . Diabetes Brother        right BKA  . Kidney failure Brother        on dialysis  . Kidney failure Brother        on dialysis  . Diabetes Brother        bilateral BKA  . Colon polyps Neg Hx   . Colon cancer Neg Hx   . Pancreatic disease Neg Hx     Social History Social History  Substance Use Topics  . Smoking status: Never Smoker  . Smokeless tobacco: Never Used     Comment: Quit x 10 years/ never smoked on regular basis  . Alcohol use 1.2 oz/week    2 Shots of liquor per week     Comment: drinks on weekends, gin/vodka 1/5th.    Allergies  Allergen Reactions  . Aspirin     REACTION: Diverticular Bleed    Current Outpatient Prescriptions  Medication Sig Dispense Refill  . acetaminophen (TYLENOL) 500 MG tablet Take 500 mg by mouth every 6 (six) hours as needed for moderate pain.    Marland Kitchen amLODipine (NORVASC) 10 MG tablet TAKE 1 TABLET BY MOUTH ONCE A DAY. 90 tablet 3  . amLODipine (NORVASC) 5 MG tablet Take 1 tablet (5 mg total) by mouth daily. 90 tablet 3  . atorvastatin (LIPITOR) 20 MG tablet Take 1 tablet (20 mg total) by mouth daily. 90 tablet 3  . colchicine 0.6 MG tablet One by mouth three times a day for five days for gout pain. 15 tablet 3  . febuxostat (ULORIC) 40 MG tablet Take 40 mg by mouth daily.    Marland Kitchen gabapentin (NEURONTIN) 300 MG capsule Take 300 mg by mouth 2 (two) times daily.     Marland Kitchen HYDROcodone-acetaminophen (NORCO/VICODIN) 5-325 MG tablet One tablet every six hours as needed for acute pain.  Must last 14 days. 60 tablet 0  . ibuprofen (ADVIL,MOTRIN) 800 MG tablet take one pill 2 - 3 times a day for wrist pain  Take with food 30 tablet 0  . levothyroxine (SYNTHROID) 200 MCG tablet Take 1 tablet (200 mcg total)  by mouth daily before breakfast. 90 tablet 0  . metoprolol succinate (TOPROL XL) 25 MG 24 hr tablet Take 1 tablet (25 mg total) by mouth daily. 90 tablet 3  . omeprazole (PRILOSEC) 20 MG capsule Take 1 capsule (20 mg total) by mouth daily. 30 capsule 11  . Polysacchar Iron-FA-B12 (FERREX 150 FORTE) 150-1-25 MG-MG-MCG CAPS Take 1 capsule by mouth daily. 90 capsule 4  . potassium chloride SA (K-DUR,KLOR-CON) 20 MEQ tablet Take  1 tablet (20 mEq total) by mouth daily. 30 tablet 0  . tamsulosin (FLOMAX) 0.4 MG CAPS capsule Take 1 capsule (0.4 mg total) by mouth daily. 90 capsule 3  . Vitamin D, Ergocalciferol, (DRISDOL) 50000 units CAPS capsule Take 50,000 Units by mouth once a week.  0   No current facility-administered medications for this visit.      Physical Exam  Blood pressure 118/75, pulse (!) 101, resp. rate 18, height 6' (1.829 m), weight 242 lb (109.8 kg).  Constitutional: overall normal hygiene, normal nutrition, well developed, normal grooming, normal body habitus. Assistive device:none  Musculoskeletal: gait and station Limp none, muscle tone and strength are normal, no tremors or atrophy is present.  .  Neurological: coordination overall normal.  Deep tendon reflex/nerve stretch intact.  Sensation normal.  Cranial nerves II-XII intact.   Skin:   Normal overall no scars, lesions, ulcers or rashes. No psoriasis.  Psychiatric: Alert and oriented x 3.  Recent memory intact, remote memory unclear.  Normal mood and affect. Well groomed.  Good eye contact.  Cardiovascular: overall no swelling, no varicosities, no edema bilaterally, normal temperatures of the legs and arms, no clubbing, cyanosis and good capillary refill.  Lymphatic: palpation is normal.  All other systems reviewed and are negative   Left hand has slight swelling and full motion.  NV intact.  He has no redness.  The patient has been educated about the nature of the problem(s) and counseled on treatment options.   The patient appeared to understand what I have discussed and is in agreement with it.  Encounter Diagnoses  Name Primary?  . Acute idiopathic gout of left hand Yes  . Chronic pain disorder     PLAN Call if any problems.  Precautions discussed.  Continue current medications.   Return to clinic 2 weeks   I have reviewed the [place] web site prior to prescribing narcotic medicine for this patient.  Get serum uric acid done.  Electronically Signed Sanjuana Kava, MD 9/25/20188:43 AM

## 2017-05-25 NOTE — Patient Instructions (Signed)
Get labs for gout

## 2017-05-26 ENCOUNTER — Ambulatory Visit (HOSPITAL_COMMUNITY)
Admission: RE | Admit: 2017-05-26 | Discharge: 2017-05-26 | Disposition: A | Discharge status: Home / Self Care | Payer: Medicare Other | Source: Ambulatory Visit | Attending: Cardiology | Admitting: Cardiology

## 2017-05-26 DIAGNOSIS — E785 Hyperlipidemia, unspecified: Secondary | ICD-10-CM | POA: Insufficient documentation

## 2017-05-26 DIAGNOSIS — I471 Supraventricular tachycardia: Secondary | ICD-10-CM | POA: Diagnosis not present

## 2017-05-26 DIAGNOSIS — I071 Rheumatic tricuspid insufficiency: Secondary | ICD-10-CM | POA: Diagnosis not present

## 2017-05-26 DIAGNOSIS — I10 Essential (primary) hypertension: Secondary | ICD-10-CM | POA: Insufficient documentation

## 2017-05-26 DIAGNOSIS — K219 Gastro-esophageal reflux disease without esophagitis: Secondary | ICD-10-CM | POA: Insufficient documentation

## 2017-05-26 DIAGNOSIS — C7A00 Malignant carcinoid tumor of unspecified site: Secondary | ICD-10-CM | POA: Insufficient documentation

## 2017-05-26 DIAGNOSIS — F1010 Alcohol abuse, uncomplicated: Secondary | ICD-10-CM | POA: Insufficient documentation

## 2017-05-26 LAB — ECHOCARDIOGRAM COMPLETE
CHL CUP DOP CALC LVOT VTI: 20.9 cm
E decel time: 194 msec
EERAT: 10.1
FS: 34 % (ref 28–44)
IV/PV OW: 1.01
LA ID, A-P, ES: 39 mm
LA diam end sys: 39 mm
LADIAMINDEX: 1.63 cm/m2
LAVOL: 80.1 mL
LAVOLA4C: 82 mL
LAVOLIN: 33.5 mL/m2
LDCA: 3.8 cm2
LV E/e' medial: 10.1
LV E/e'average: 10.1
LV PW d: 9.59 mm — AB (ref 0.6–1.1)
LV dias vol index: 43 mL/m2
LV e' LATERAL: 9.25 cm/s
LVDIAVOL: 103 mL (ref 62–150)
LVOT SV: 79 mL
LVOT peak grad rest: 3 mmHg
LVOT peak vel: 89.4 cm/s
LVOTD: 22 mm
LVSYSVOL: 43 mL
LVSYSVOLIN: 18 mL/m2
Lateral S' vel: 10.6 cm/s
MV Dec: 194
MV Peak grad: 3 mmHg
MVPKAVEL: 78.8 m/s
MVPKEVEL: 93.4 m/s
RV sys press: 48 mmHg
Reg peak vel: 335 cm/s
Simpson's disk: 59
Stroke v: 60 ml
TAPSE: 22.2 mm
TDI e' lateral: 9.25
TDI e' medial: 6.74
TRMAXVEL: 335 cm/s

## 2017-05-26 NOTE — Progress Notes (Signed)
*  PRELIMINARY RESULTS* Echocardiogram 2D Echocardiogram has been performed.  [name] 05/26/2017, 11:12 AM

## 2017-05-28 ENCOUNTER — Ambulatory Visit (INDEPENDENT_AMBULATORY_CARE_PROVIDER_SITE_OTHER): Payer: Medicare Other | Admitting: Urology

## 2017-05-28 DIAGNOSIS — Z87440 Personal history of urinary (tract) infections: Secondary | ICD-10-CM | POA: Diagnosis not present

## 2017-05-28 DIAGNOSIS — R3912 Poor urinary stream: Secondary | ICD-10-CM | POA: Diagnosis not present

## 2017-05-28 DIAGNOSIS — N401 Enlarged prostate with lower urinary tract symptoms: Secondary | ICD-10-CM

## 2017-05-28 DIAGNOSIS — R972 Elevated prostate specific antigen [PSA]: Secondary | ICD-10-CM

## 2017-06-01 ENCOUNTER — Encounter (HOSPITAL_COMMUNITY): Payer: Self-pay | Admitting: Cardiology

## 2017-06-01 ENCOUNTER — Emergency Department (HOSPITAL_COMMUNITY)
Admission: EM | Admit: 2017-06-01 | Discharge: 2017-06-01 | Disposition: A | Discharge status: Home / Self Care | Payer: Medicare Other | Attending: Emergency Medicine | Admitting: Emergency Medicine

## 2017-06-01 DIAGNOSIS — K047 Periapical abscess without sinus: Secondary | ICD-10-CM | POA: Diagnosis not present

## 2017-06-01 DIAGNOSIS — E039 Hypothyroidism, unspecified: Secondary | ICD-10-CM | POA: Diagnosis not present

## 2017-06-01 DIAGNOSIS — D649 Anemia, unspecified: Secondary | ICD-10-CM | POA: Insufficient documentation

## 2017-06-01 DIAGNOSIS — E785 Hyperlipidemia, unspecified: Secondary | ICD-10-CM | POA: Diagnosis not present

## 2017-06-01 DIAGNOSIS — K0889 Other specified disorders of teeth and supporting structures: Secondary | ICD-10-CM | POA: Diagnosis present

## 2017-06-01 DIAGNOSIS — I10 Essential (primary) hypertension: Secondary | ICD-10-CM | POA: Insufficient documentation

## 2017-06-01 DIAGNOSIS — R600 Localized edema: Secondary | ICD-10-CM | POA: Insufficient documentation

## 2017-06-01 DIAGNOSIS — Z79899 Other long term (current) drug therapy: Secondary | ICD-10-CM | POA: Diagnosis not present

## 2017-06-01 MED ORDER — PENICILLIN V POTASSIUM 500 MG PO TABS: 500.0000 mg | ORAL_TABLET | Freq: Three times a day (TID) | ORAL | 0 refills | Status: DC

## 2017-06-01 NOTE — Discharge Instructions (Signed)
You have a dental abscess.  Please take antibiotic as prescribed and follow up closely with a dentist in the next 2 days for further care.  You may continue taking your home pain medication as needed.

## 2017-06-01 NOTE — ED Triage Notes (Signed)
Left jaw swelling and pain since Sunday.

## 2017-06-01 NOTE — ED Provider Notes (Signed)
[place] DEPT Provider Note   CSN: [id number] Arrival date & time: 06/01/17  1025     History   Chief Complaint Chief Complaint  Patient presents with  . Dental Pain    HPI [name] is a 66 y.o. male.  HPI   66 year old male presenting for evaluation of dental pain. Patient reports progressive worsening dental pain and facial swelling ongoing for the past 3 days. Pain is affecting his left lower jaw, described as a throbbing sensation, persistent, nothing makes it better or worse. Does report some headache associated with that. Denies any specific treatment tried. No report of fever, chest pain, trouble breathing, neck pain, or rash. Denies any recent injury. Report having similar pain to the same site in the past. Has not follow up with a dentist.  Past Medical History:  Diagnosis Date  . Anemia    FeDA: GASTRIC POLYPS, B12; TCS 2008 EGD 2009, 2008-HB 11.1 MCV 83.6 CR 1.22, 2009 FERRITIN 102-22  . B12 deficiency   . Carcinoid tumor determined by biopsy of stomach 09/25/2014  . Chronic knee pain   . Diverticulosis of colon    Lower GI bleed 2008  . Essential hypertension   . GERD (gastroesophageal reflux disease)   . Gout   . Hepatomegaly    Hepatic steatosis  . History of alcohol abuse   . History of septic arthritis   . Hypothyroidism   . PUD   . Substance abuse [facility])     Patient Active Problem List   Diagnosis Date Noted  . Hyperlipidemia 10/23/2016  . Mild intellectual disabilities (CODE) 09/29/2016  . Personal history of gout 09/22/2016  . Literacy level of illiterate 09/22/2016  . History of bilateral knee replacement 09/22/2016  . Carcinoid tumor determined by biopsy of stomach 04/10/2016  . Tubulovillous adenoma of large intestine 09/12/2015  . Neuroendocrine tumor 09/25/2014  . Elevated LFTs 07/17/2014  . Iron deficiency anemia 07/29/2010  . Fatty liver 07/29/2010  . ABUSE, ALCOHOL, UNSPECIFIED 11/30/2008  . Osteoarthrosis involving lower leg  08/20/2008  . Essential hypertension 10/20/2006  . B12 deficiency 08/15/2006  . GERD 08/15/2006  . IBS 08/15/2006  . BPH without obstruction/lower urinary tract symptoms 08/15/2006  . OSTEOARTHRITIS 08/15/2006  . DEGENERATION, DISC NOS 08/15/2006    Past Surgical History:  Procedure Laterality Date  . BIOPSY  08/14/2014   Procedure: BIOPSY;  Surgeon: Danie Binder, MD;  Location: [facility] ORS;  Service: Endoscopy;;  . BIOPSY  03/12/2015   Procedure: BIOPSY;  Surgeon: Danie Binder, MD;  Location: [facility];  Service: Endoscopy;;  . BIOPSY  04/21/2016   Procedure: BIOPSY;  Surgeon: Danie Binder, MD;  Location: [facility] END[facility];  Service: Endoscopy;;  bx's of antrum, body of stomach, fundus, and cardia  . CHOLECYSTECTOMY    . COLONOSCOPY  2008 [facility] DJ   LGIB 2o to TICS, prep good  . COLONOSCOPY WITH PROPOFOL N/A 08/14/2014   SLF: 1. Four large colon polyps removed. 2. The left colon is redundant 3. Moderate diverticulosis throughout teh entire examined colon  . COLONOSCOPY WITH PROPOFOL N/A 11/06/2014   SLF: 8 small polyps removed. One large pedunculated polyp removed from the ascending colon, tubulovillous and tubular adenomas. Next colonoscopy March 2019  . ESOPHAGOGASTRODUODENOSCOPY  12/08/2007   [id number]:[id number] gastric polyps seen in the cardia and body of the stomach/Normal esophagus without evidence of Barrett's, mass, erosion/Normal duodenal bulb and second portion of the duodenum. Benign bx.  . ESOPHAGOGASTRODUODENOSCOPY (EGD) WITH PROPOFOL N/A 08/14/2014  SLF: 1. Heme postive stools due to gastric and colon polyps 2. Multiple gastric   . ESOPHAGOGASTRODUODENOSCOPY (EGD) WITH PROPOFOL N/A 03/12/2015   SLF: Multiple gastric nodules seen in gasric body/antrum. 2. Non-erosive gastritis ( inflammation) was found in the gastric antrum.   . ESOPHAGOGASTRODUODENOSCOPY (EGD) WITH PROPOFOL N/A 10/08/2015   Procedure: ESOPHAGOGASTRODUODENOSCOPY (EGD) WITH PROPOFOL;  Surgeon: Danie Binder, MD;  [facility];  Service: Endoscopy;  Laterality: N/A;  0945  . ESOPHAGOGASTRODUODENOSCOPY (EGD) WITH PROPOFOL N/A 04/21/2016   Procedure: ESOPHAGOGASTRODUODENOSCOPY (EGD) WITH PROPOFOL;  Surgeon: Danie Binder, MD;  Location[facility];  Service: Endoscopy;  Laterality: N/A;  730   . HAND SURGERY    . JOINT REPLACEMENT    . POLYPECTOMY  08/14/2014   Procedure: POLYPECTOMY;  Surgeon: Danie Binder, MD;  Location: [facility] OR[facility];  Service: Endoscopy;;  . POLYPECTOMY N/A 11/06/2014   Procedure: POLYPECTOMY;  Surgeon: Danie Binder, MD;  Location: [facility] ORS;  Service: Endoscopy;  Laterality: N/A;  Transverse Colon x3, Ascending Colon x2, Descending Colon x3  . REPLACEMENT TOTAL KNEE BILATERAL    . UPPER GASTROINTESTINAL ENDOSCOPY  APR 2009   INFLAMED HYPERPLASTIC POLYPS, CHRONIC GASTRITIS       Home Medications    Prior to Admission medications   Medication Sig Start Date End Date Taking? Authorizing Provider  acetaminophen (TYLENOL) 500 MG tablet Take 500 mg by mouth every 6 (six) hours as needed for moderate pain.    [provider]  amLODipine (NORVASC) 10 MG tablet TAKE 1 TABLET BY MOUTH ONCE A DAY. 05/19/17   Raylene Everts, MD  amLODipine (NORVASC) 5 MG tablet Take 1 tablet (5 mg total) by mouth daily. 05/17/17 08/15/17  Satira Sark, MD  atorvastatin (LIPITOR) 20 MG tablet Take 1 tablet (20 mg total) by mouth daily. 10/23/16   Raylene Everts, MD  colchicine 0.6 MG tablet One by mouth three times a day for five days for gout pain. 03/24/17   Sanjuana Kava, MD  febuxostat (ULORIC) 40 MG tablet Take 40 mg by mouth daily.    [provider]  gabapentin (NEURONTIN) 300 MG capsule Take 300 mg by mouth 2 (two) times daily.  09/22/16   [provider]  HYDROcodone-acetaminophen (NORCO/VICODIN) 5-325 MG tablet One tablet every six hours as needed for acute pain.  Must last 14 days. 05/25/17   Sanjuana Kava, MD  ibuprofen (ADVIL,MOTRIN) 800 MG tablet take one pill 2  - 3 times a day for wrist pain  Take with food 03/05/17   Raylene Everts, MD  levothyroxine (SYNTHROID) 200 MCG tablet Take 1 tablet (200 mcg total) by mouth daily before breakfast. 02/11/17   Raylene Everts, MD  metoprolol succinate (TOPROL XL) 25 MG 24 hr tablet Take 1 tablet (25 mg total) by mouth daily. 05/17/17   Satira Sark, MD  omeprazole (PRILOSEC) 20 MG capsule Take 1 capsule (20 mg total) by mouth daily. 09/30/16   Fields, Marga Melnick, MD  Polysacchar Iron-FA-B12 (FERREX 150 FORTE) 150-1-25 MG-MG-MCG CAPS Take 1 capsule by mouth daily. 03/15/17   Baird Cancer, PA-C  potassium chloride SA (K-DUR,KLOR-CON) 20 MEQ tablet Take 1 tablet (20 mEq total) by mouth daily. 03/22/17   Baird Cancer, PA-C  tamsulosin (FLOMAX) 0.4 MG CAPS capsule Take 1 capsule (0.4 mg total) by mouth daily. 10/13/16   Raylene Everts, MD  Vitamin D, Ergocalciferol, (DRISDOL) 50000 units CAPS capsule Take 50,000 Units by mouth  once a week. 02/02/17   [provider]    Family History Family History  Problem Relation Age of Onset  . Diabetes Mother   . Renal Disease Mother        failure  . Diabetes Father        'old age'  . Hypertension Sister   . Hypertension Brother   . Diabetes Brother   . Diabetes Sister   . Diabetes Sister   . Diabetes Brother   . Hypertension Brother   . Diabetes Brother        right BKA  . Kidney failure Brother        on dialysis  . Kidney failure Brother        on dialysis  . Diabetes Brother        bilateral BKA  . Colon polyps Neg Hx   . Colon cancer Neg Hx   . Pancreatic disease Neg Hx     Social History Social History  Substance Use Topics  . Smoking status: Never Smoker  . Smokeless tobacco: Never Used     Comment: Quit x 10 years/ never smoked on regular basis  . Alcohol use 1.2 oz/week    2 Shots of liquor per week     Comment: drinks on weekends, gin/vodka 1/5th.     Allergies   Aspirin   Review of Systems Review of Systems    Constitutional: Negative for fever.  HENT: Positive for dental problem and facial swelling. Negative for drooling.   Respiratory: Negative for chest tightness and shortness of breath.   Cardiovascular: Negative for chest pain.  Gastrointestinal: Negative for abdominal pain.     Physical Exam Updated Vital Signs BP 133/85 (BP Location: Right Arm)   Pulse 94   Temp (!) 97.1 F (36.2 C) (Temporal)   Resp 16   Ht 6\' 6"  (1.981 m)   Wt 108.9 kg (240 lb)   SpO2 97%   BMI 27.73 kg/m   Physical Exam  Constitutional: He appears well-developed and well-nourished. No distress.  HENT:  Head: Atraumatic.  Mouth: tenderness and swelling noted to Left lower jaw with adjacent facial swelling.  Several missing teeth to left lower jaw.  No trismus. Throat is normal.   Eyes: Conjunctivae are normal.  Neck: Neck supple.  Neurological: He is alert.  Skin: No rash noted.  Psychiatric: He has a normal mood and affect.  Nursing note and vitals reviewed.    ED Treatments / Results  Labs (all labs ordered are listed, but only abnormal results are displayed) Labs Reviewed - No data to display  EKG  EKG Interpretation None       Radiology No results found.  Procedures Procedures (including critical care time)  Medications Ordered in ED Medications - No data to display   Initial Impression / Assessment and Plan / ED Course  I have reviewed the triage vital signs and the nursing notes.  Pertinent labs & imaging results that were available during my care of the patient were reviewed by me and considered in my medical decision making (see chart for details).     BP 133/85 (BP Location: Right Arm)   Pulse 94   Temp (!) 97.1 F (36.2 C) (Temporal)   Resp 16   Ht 6\' 6"  (1.981 m)   Wt 108.9 kg (240 lb)   SpO2 97%   BMI 27.73 kg/m    Final Clinical Impressions(s) / ED Diagnoses   Final diagnoses:  Dental abscess  New Prescriptions New Prescriptions   PENICILLIN V  POTASSIUM (VEETID) 500 MG TABLET    Take 1 tablet (500 mg total) by mouth 3 (three) times daily.   11:37 AM Pt here with dental pain and facial swelling consistent with periapical abscess with facial involvement.  No airway compromise, no systemic manifestation.  Will prescribe abx.  I have checked pt under [place] drug database and noted that pt recently received narcotic on 05/25/17.  Encourage pt to continue taking his pain medication previously prescribed.  Return precaution given.    Domenic Moras, PA-C 06/01/17 1138    Nat Christen, MD 06/02/17 1247

## 2017-06-07 ENCOUNTER — Other Ambulatory Visit: Payer: Self-pay | Admitting: Family Medicine

## 2017-06-08 ENCOUNTER — Ambulatory Visit (INDEPENDENT_AMBULATORY_CARE_PROVIDER_SITE_OTHER): Payer: Medicare Other | Admitting: Orthopaedic Surgery

## 2017-06-08 VITALS — BP 121/78 | HR 77 | Temp 97.8°F | Ht 72.0 in | Wt 240.0 lb

## 2017-06-08 DIAGNOSIS — M10042 Idiopathic gout, left hand: Secondary | ICD-10-CM

## 2017-06-08 NOTE — Progress Notes (Signed)
Patient [name], male DOB:12-17-1950, 66 y.o. [id number]  Chief Complaint  Patient presents with  . Follow-up    Gout    HPI  [name] is a 66 y.o. male who has gout of the left hand.  He is much improved. He is taking his Uloric. His serum uric acid is 5.2.  He has no new trauma, no redness, no numbness. HPI  Body mass index is 32.55 kg/m.  ROS  Review of Systems  Past Medical History:  Diagnosis Date  . Anemia    FeDA: GASTRIC POLYPS, B12; TCS 2008 EGD 2009, 2008-HB 11.1 MCV 83.6 CR 1.22, 2009 FERRITIN 102-22  . B12 deficiency   . Carcinoid tumor determined by biopsy of stomach 09/25/2014  . Chronic knee pain   . Diverticulosis of colon    Lower GI bleed 2008  . Essential hypertension   . GERD (gastroesophageal reflux disease)   . Gout   . Hepatomegaly    Hepatic steatosis  . History of alcohol abuse   . History of septic arthritis   . Hypothyroidism   . PUD   . Substance abuse [facility])     Past Surgical History:  Procedure Laterality Date  . BIOPSY  08/14/2014   Procedure: BIOPSY;  Surgeon: Danie Binder, MD;  Location: [facility];  Service: Endoscopy;;  . BIOPSY  03/12/2015   Procedure: BIOPSY;  Surgeon: Danie Binder, MD;  Location: [facility];  Service: Endoscopy;;  . BIOPSY  04/21/2016   Procedure: BIOPSY;  Surgeon: Danie Binder, MD;  [facility];  Service: Endoscopy;;  bx's of antrum, body of stomach, fundus, and cardia  . CHOLECYSTECTOMY    . COLONOSCOPY  2008 [facility] DJ   LGIB 2o to TICS, prep good  . COLONOSCOPY WITH PROPOFOL N/A 08/14/2014   SLF: 1. Four large colon polyps removed. 2. The left colon is redundant 3. Moderate diverticulosis throughout teh entire examined colon  . COLONOSCOPY WITH PROPOFOL N/A 11/06/2014   SLF: 8 small polyps removed. One large pedunculated polyp removed from the ascending colon, tubulovillous and tubular adenomas. Next colonoscopy March 2019  . ESOPHAGOGASTRODUODENOSCOPY  12/08/2007   YPP:[id number] gastric polyps  seen in the cardia and body of the stomach/Normal esophagus without evidence of Barrett's, mass, erosion/Normal duodenal bulb and second portion of the duodenum. Benign bx.  . ESOPHAGOGASTRODUODENOSCOPY (EGD) WITH PROPOFOL N/A 08/14/2014   SLF: 1. Heme postive stools due to gastric and colon polyps 2. Multiple gastric   . ESOPHAGOGASTRODUODENOSCOPY (EGD) WITH PROPOFOL N/A 03/12/2015   SLF: Multiple gastric nodules seen in gasric body/antrum. 2. Non-erosive gastritis ( inflammation) was found in the gastric antrum.   . ESOPHAGOGASTRODUODENOSCOPY (EGD) WITH PROPOFOL N/A 10/08/2015   Procedure: ESOPHAGOGASTRODUODENOSCOPY (EGD) WITH PROPOFOL;  Surgeon: Danie Binder, MD;  [facility];  Service: Endoscopy;  Laterality: N/A;  0945  . ESOPHAGOGASTRODUODENOSCOPY (EGD) WITH PROPOFOL N/A 04/21/2016   Procedure: ESOPHAGOGASTRODUODENOSCOPY (EGD) WITH PROPOFOL;  Surgeon: Danie Binder, MD;  [facility];  Service: Endoscopy;  Laterality: N/A;  730   . HAND SURGERY    . JOINT REPLACEMENT    . POLYPECTOMY  08/14/2014   Procedure: POLYPECTOMY;  Surgeon: Danie Binder, MD;  Location: [facility];  Service: Endoscopy;;  . POLYPECTOMY N/A 11/06/2014   Procedure: POLYPECTOMY;  Surgeon: Danie Binder, MD;  Location: [facility];  Service: Endoscopy;  Laterality: N/A;  Transverse Colon x3, Ascending Colon x2, Descending Colon x3  . REPLACEMENT TOTAL KNEE BILATERAL    .  UPPER GASTROINTESTINAL ENDOSCOPY  APR 2009   INFLAMED HYPERPLASTIC POLYPS, CHRONIC GASTRITIS    Family History  Problem Relation Age of Onset  . Diabetes Mother   . Renal Disease Mother        failure  . Diabetes Father        'old age'  . Hypertension Sister   . Hypertension Brother   . Diabetes Brother   . Diabetes Sister   . Diabetes Sister   . Diabetes Brother   . Hypertension Brother   . Diabetes Brother        right BKA  . Kidney failure Brother        on dialysis  . Kidney failure Brother        on dialysis  .  Diabetes Brother        bilateral BKA  . Colon polyps Neg Hx   . Colon cancer Neg Hx   . Pancreatic disease Neg Hx     Social History Social History  Substance Use Topics  . Smoking status: Never Smoker  . Smokeless tobacco: Never Used     Comment: Quit x 10 years/ never smoked on regular basis  . Alcohol use 1.2 oz/week    2 Shots of liquor per week     Comment: drinks on weekends, gin/vodka 1/5th.    Allergies  Allergen Reactions  . Aspirin     REACTION: Diverticular Bleed    Current Outpatient Prescriptions  Medication Sig Dispense Refill  . acetaminophen (TYLENOL) 500 MG tablet Take 500 mg by mouth every 6 (six) hours as needed for moderate pain.    Marland Kitchen amLODipine (NORVASC) 10 MG tablet TAKE 1 TABLET BY MOUTH ONCE A DAY. 90 tablet 3  . amLODipine (NORVASC) 5 MG tablet Take 1 tablet (5 mg total) by mouth daily. 90 tablet 3  . atorvastatin (LIPITOR) 20 MG tablet Take 1 tablet (20 mg total) by mouth daily. 90 tablet 3  . colchicine 0.6 MG tablet One by mouth three times a day for five days for gout pain. 15 tablet 3  . febuxostat (ULORIC) 40 MG tablet Take 40 mg by mouth daily.    Marland Kitchen gabapentin (NEURONTIN) 300 MG capsule Take 300 mg by mouth 2 (two) times daily.     Marland Kitchen HYDROcodone-acetaminophen (NORCO/VICODIN) 5-325 MG tablet One tablet every six hours as needed for acute pain.  Must last 14 days. 60 tablet 0  . ibuprofen (ADVIL,MOTRIN) 800 MG tablet take one pill 2 - 3 times a day for wrist pain  Take with food 30 tablet 0  . levothyroxine (SYNTHROID) 200 MCG tablet Take 1 tablet (200 mcg total) by mouth daily before breakfast. 90 tablet 0  . metoprolol succinate (TOPROL XL) 25 MG 24 hr tablet Take 1 tablet (25 mg total) by mouth daily. 90 tablet 3  . omeprazole (PRILOSEC) 20 MG capsule Take 1 capsule (20 mg total) by mouth daily. 30 capsule 11  . penicillin v potassium (VEETID) 500 MG tablet Take 1 tablet (500 mg total) by mouth 3 (three) times daily. 30 tablet 0  . Polysacchar  Iron-FA-B12 (FERREX 150 FORTE) 150-1-25 MG-MG-MCG CAPS Take 1 capsule by mouth daily. 90 capsule 4  . potassium chloride SA (K-DUR,KLOR-CON) 20 MEQ tablet Take 1 tablet (20 mEq total) by mouth daily. 30 tablet 0  . tamsulosin (FLOMAX) 0.4 MG CAPS capsule Take 1 capsule (0.4 mg total) by mouth daily. 90 capsule 3  . Vitamin D, Ergocalciferol, (DRISDOL) 50000 units CAPS capsule  Take 50,000 Units by mouth once a week.  0   No current facility-administered medications for this visit.      Physical Exam  Blood pressure 121/78, pulse 77, temperature 97.8 F (36.6 C), height 6' (1.829 m), weight 240 lb (108.9 kg).  Constitutional: overall normal hygiene, normal nutrition, well developed, normal grooming, normal body habitus. Assistive device:cane  Musculoskeletal: gait and station Limp right, muscle tone and strength are normal, no tremors or atrophy is present.  .  Neurological: coordination overall normal.  Deep tendon reflex/nerve stretch intact.  Sensation normal.  Cranial nerves II-XII intact.   Skin:   Normal overall no scars, lesions, ulcers or rashes. No psoriasis.  Psychiatric: Alert and oriented x 3.  Recent memory intact, remote memory unclear.  Normal mood and affect. Well groomed.  Good eye contact.  Cardiovascular: overall no swelling, no varicosities, no edema bilaterally, normal temperatures of the legs and arms, no clubbing, cyanosis and good capillary refill.  Lymphatic: palpation is normal.  All other systems reviewed and are negative   The left hand has no swelling or pain.  NV intact. ROM is full.  The patient has been educated about the nature of the problem(s) and counseled on treatment options.  The patient appeared to understand what I have discussed and is in agreement with it.  Encounter Diagnosis  Name Primary?  . Acute idiopathic gout of left hand Yes    PLAN Call if any problems.  Precautions discussed.  Continue current medications.   Return to  clinic 6 weeks   Electronically Signed Sanjuana Kava, MD 10/9/20188:27 AM

## 2017-06-08 NOTE — Telephone Encounter (Signed)
Seen 7 6 18 

## 2017-06-21 ENCOUNTER — Telehealth: Payer: Self-pay | Admitting: Orthopaedic Surgery

## 2017-06-22 NOTE — Progress Notes (Deleted)
Cardiology Office Note  Date: 06/22/2017   ID: [name], DOB Feb 12, 1951, MRN [id number]  PCP: Raylene Everts, MD  Primary Cardiologist: Rozann Lesches, MD   No chief complaint on file.   History of Present Illness: [name] is a 66 y.o. male seen recently in consultation in September.  At the last dose we reduced Norvasc and initiated Toprol-XL for treatment of PSVT and ectopy. Echo [place] was obtained as outlined below, LVEF 55-60%.  Past Medical History:  Diagnosis Date  . Anemia    FeDA: GASTRIC POLYPS, B12; TCS 2008 EGD 2009, 2008-HB 11.1 MCV 83.6 CR 1.22, 2009 FERRITIN 102-22  . B12 deficiency   . Carcinoid tumor determined by biopsy of stomach 09/25/2014  . Chronic knee pain   . Diverticulosis of colon    Lower GI bleed 2008  . Essential hypertension   . GERD (gastroesophageal reflux disease)   . Gout   . Hepatomegaly    Hepatic steatosis  . History of alcohol abuse   . History of septic arthritis   . Hypothyroidism   . PUD   . Substance abuse [facility])     Past Surgical History:  Procedure Laterality Date  . BIOPSY  08/14/2014   Procedure: BIOPSY;  Surgeon: Danie Binder, MD;  Location: [facility];  Service: Endoscopy;;  . BIOPSY  03/12/2015   Procedure: BIOPSY;  Surgeon: Danie Binder, MD;  Location: [facility];  Service: Endoscopy;;  . BIOPSY  04/21/2016   Procedure: BIOPSY;  Surgeon: Danie Binder, MD;  Location: [facility];  Service: Endoscopy;;  bx's of antrum, body of stomach, fundus, and cardia  . CHOLECYSTECTOMY    . COLONOSCOPY  2008 [facility] DJ   LGIB 2o to TICS, prep good  . COLONOSCOPY WITH PROPOFOL N/A 08/14/2014   SLF: 1. Four large colon polyps removed. 2. The left colon is redundant 3. Moderate diverticulosis throughout teh entire examined colon  . COLONOSCOPY WITH PROPOFOL N/A 11/06/2014   SLF: 8 small polyps removed. One large pedunculated polyp removed from the ascending colon, tubulovillous and tubular adenomas. Next colonoscopy  March 2019  . ESOPHAGOGASTRODUODENOSCOPY  12/08/2007   LFY:BOFB gastric polyps seen in the cardia and body of the stomach/Normal esophagus without evidence of Barrett's, mass, erosion/Normal duodenal bulb and second portion of the duodenum. Benign bx.  . ESOPHAGOGASTRODUODENOSCOPY (EGD) WITH PROPOFOL N/A 08/14/2014   SLF: 1. Heme postive stools due to gastric and colon polyps 2. Multiple gastric   . ESOPHAGOGASTRODUODENOSCOPY (EGD) WITH PROPOFOL N/A 03/12/2015   SLF: Multiple gastric nodules seen in gasric body/antrum. 2. Non-erosive gastritis ( inflammation) was found in the gastric antrum.   . ESOPHAGOGASTRODUODENOSCOPY (EGD) WITH PROPOFOL N/A 10/08/2015   Procedure: ESOPHAGOGASTRODUODENOSCOPY (EGD) WITH PROPOFOL;  Surgeon: Danie Binder, MD;  [facility];  Service: Endoscopy;  Laterality: N/A;  0945  . ESOPHAGOGASTRODUODENOSCOPY (EGD) WITH PROPOFOL N/A 04/21/2016   Procedure: ESOPHAGOGASTRODUODENOSCOPY (EGD) WITH PROPOFOL;  Surgeon: Danie Binder, MD;  [facility];  Service: Endoscopy;  Laterality: N/A;  730   . HAND SURGERY    . JOINT REPLACEMENT    . POLYPECTOMY  08/14/2014   Procedure: POLYPECTOMY;  Surgeon: Danie Binder, MD;  Location: [facility];  Service: Endoscopy;;  . POLYPECTOMY N/A 11/06/2014   Procedure: POLYPECTOMY;  Surgeon: Danie Binder, MD;  Location: [facility];  Service: Endoscopy;  Laterality: N/A;  Transverse Colon x3, Ascending Colon x2, Descending Colon x3  . REPLACEMENT TOTAL KNEE  BILATERAL    . UPPER GASTROINTESTINAL ENDOSCOPY  APR 2009   INFLAMED HYPERPLASTIC POLYPS, CHRONIC GASTRITIS    Current Outpatient Prescriptions  Medication Sig Dispense Refill  . acetaminophen (TYLENOL) 500 MG tablet Take 500 mg by mouth every 6 (six) hours as needed for moderate pain.    Marland Kitchen amLODipine (NORVASC) 10 MG tablet TAKE 1 TABLET BY MOUTH ONCE A DAY. 90 tablet 3  . amLODipine (NORVASC) 5 MG tablet Take 1 tablet (5 mg total) by mouth daily. 90 tablet 3  .  atorvastatin (LIPITOR) 20 MG tablet Take 1 tablet (20 mg total) by mouth daily. 90 tablet 3  . colchicine 0.6 MG tablet TAKE 1 TABLET BY MOUTH 3 TIMES DAILY X5 DAYS FOR GOUT PAIN. 15 tablet 5  . febuxostat (ULORIC) 40 MG tablet Take 40 mg by mouth daily.    Marland Kitchen gabapentin (NEURONTIN) 300 MG capsule Take 300 mg by mouth 2 (two) times daily.     Marland Kitchen HYDROcodone-acetaminophen (NORCO/VICODIN) 5-325 MG tablet One tablet every six hours as needed for acute pain.  Must last 14 days. 60 tablet 0  . ibuprofen (ADVIL,MOTRIN) 800 MG tablet take one pill 2 - 3 times a day for wrist pain  Take with food 30 tablet 0  . levothyroxine (SYNTHROID, LEVOTHROID) 200 MCG tablet TAKE 1 TABLET DAILY BEFORE BREAKFAST. 90 tablet 3  . metoprolol succinate (TOPROL XL) 25 MG 24 hr tablet Take 1 tablet (25 mg total) by mouth daily. 90 tablet 3  . omeprazole (PRILOSEC) 20 MG capsule Take 1 capsule (20 mg total) by mouth daily. 30 capsule 11  . penicillin v potassium (VEETID) 500 MG tablet Take 1 tablet (500 mg total) by mouth 3 (three) times daily. 30 tablet 0  . Polysacchar Iron-FA-B12 (FERREX 150 FORTE) 150-1-25 MG-MG-MCG CAPS Take 1 capsule by mouth daily. 90 capsule 4  . potassium chloride SA (K-DUR,KLOR-CON) 20 MEQ tablet Take 1 tablet (20 mEq total) by mouth daily. 30 tablet 0  . tamsulosin (FLOMAX) 0.4 MG CAPS capsule Take 1 capsule (0.4 mg total) by mouth daily. 90 capsule 3  . Vitamin D, Ergocalciferol, (DRISDOL) 50000 units CAPS capsule Take 50,000 Units by mouth once a week.  0   No current facility-administered medications for this visit.    Allergies:  Aspirin   Social History: The patient  reports that he has never smoked. He has never used smokeless tobacco. He reports that he drinks about 1.2 oz of alcohol per week . He reports that he does not use drugs.   Family History: The patient's family history includes Diabetes in his brother, brother, brother, brother, father, mother, sister, and sister; Hypertension in  his brother, brother, and sister; Kidney failure in his brother and brother; Renal Disease in his mother.   ROS:  Please see the history of present illness. Otherwise, complete review of systems is positive for {NONE DEFAULTED:18576::"none"}.  All other systems are reviewed and negative.   Physical Exam: VS:  There were no vitals taken for this visit., BMI There is no height or weight on file to calculate BMI.  Wt Readings from Last 3 Encounters:  06/08/17 240 lb (108.9 kg)  06/01/17 240 lb (108.9 kg)  05/25/17 242 lb (109.8 kg)    General: Patient appears comfortable at rest. HEENT: Conjunctiva and lids normal, oropharynx clear with moist mucosa. Neck: Supple, no elevated JVP or carotid bruits, no thyromegaly. Lungs: Clear to auscultation, nonlabored breathing at rest. Cardiac: Regular rate and rhythm, no S3 or  significant systolic murmur, no pericardial rub. Abdomen: Soft, nontender, no hepatomegaly, bowel sounds present, no guarding or rebound. Extremities: No pitting edema, distal pulses 2+. Skin: Warm and dry. Musculoskeletal: No kyphosis. Neuropsychiatric: Alert and oriented x3, affect grossly appropriate.  ECG: I personally reviewed the tracing from 04/28/2017 which showed sinus rhythm with burst of SVT following aberrantly conducted complexes.  Recent Labwork: 01/20/2017: TSH 2.81 03/22/2017: ALT 15; AST 20; BUN 17; Creatinine, Ser 1.11; Hemoglobin 12.0; Platelets 164; Potassium 3.3; Sodium 137     Component Value Date/Time   CHOL 150 01/20/2017 0926   TRIG 154 (H) 01/20/2017 0926   HDL 29 (L) 01/20/2017 0926   CHOLHDL 5.2 (H) 01/20/2017 0926   VLDL 31 (H) 01/20/2017 0926   LDLCALC 90 01/20/2017 0926    Other Studies Reviewed Today:  Echocardiogram 05/26/2017: Study Conclusions  - Left ventricle: The cavity size was normal. Wall thickness was   normal. Systolic function was normal. The estimated ejection   fraction was in the range of 55% to 60%. Diastolic function  is   abnormal, indeterminant grade. Wall motion was normal; there were   no regional wall motion abnormalities. - Aortic valve: Valve area (VTI): 2.39 cm^2. Valve area (Vmax):   2.39 cm^2. Valve area (Vmean): 2.11 cm^2. - Left atrium: The atrium was moderately dilated. - Atrial septum: No defect or patent foramen ovale was identified. - Tricuspid valve: There was mild-moderate regurgitation. - Pulmonary arteries: Systolic pressure was moderately increased.   PA peak pressure: 46 mm Hg (S). - Technically adequate study.  Assessment and Plan:   Current medicines were reviewed with the patient today.  No orders of the defined types were placed in this encounter.   Disposition:  Signed, Satira Sark, MD, [facility] 06/22/2017 9:19 AM    [facility] [place]. [address] [place], [place] [address] Phone: [phone]; Fax: [phone]

## 2017-06-23 ENCOUNTER — Ambulatory Visit: Payer: Medicare Other | Admitting: Cardiology

## 2017-06-23 ENCOUNTER — Encounter: Payer: Self-pay | Admitting: Cardiology

## 2017-06-28 ENCOUNTER — Other Ambulatory Visit: Payer: Self-pay | Admitting: Family Medicine

## 2017-06-28 ENCOUNTER — Telehealth: Payer: Self-pay | Admitting: Orthopaedic Surgery

## 2017-06-28 MED ORDER — HYDROCODONE-ACETAMINOPHEN 5-325 MG PO TABS: ORAL_TABLET | ORAL | 0 refills | Status: DC

## 2017-06-28 NOTE — Telephone Encounter (Signed)
Patient requests refill on Hydrocodone/Acetaminophen 5-325  Mgs.   Qty  60  Sig: One tablet every six hours as needed for acute pain. Must last 14 days.

## 2017-07-06 ENCOUNTER — Telehealth: Payer: Self-pay | Admitting: Family Medicine

## 2017-07-06 NOTE — Telephone Encounter (Signed)
Tell his it is not a good idea to use antibiotics for minor infections.  Wash with soap and water daily and use antibiotic cream to area

## 2017-07-06 NOTE — Telephone Encounter (Signed)
Called pt aware  

## 2017-07-06 NOTE — Telephone Encounter (Signed)
Do you want to rx this?

## 2017-07-06 NOTE — Telephone Encounter (Signed)
Patient is requesting a refill of doxycycline, says he has another bump on the back of his neck  Cb#: [phone]

## 2017-07-20 ENCOUNTER — Other Ambulatory Visit: Payer: Self-pay

## 2017-07-20 ENCOUNTER — Encounter: Payer: Self-pay | Admitting: Family Medicine

## 2017-07-20 ENCOUNTER — Encounter: Payer: Self-pay | Admitting: Orthopaedic Surgery

## 2017-07-20 ENCOUNTER — Ambulatory Visit (INDEPENDENT_AMBULATORY_CARE_PROVIDER_SITE_OTHER): Payer: Medicare Other | Admitting: Orthopaedic Surgery

## 2017-07-20 ENCOUNTER — Ambulatory Visit (INDEPENDENT_AMBULATORY_CARE_PROVIDER_SITE_OTHER): Payer: Medicare Other | Admitting: Family Medicine

## 2017-07-20 VITALS — BP 150/85 | HR 103 | Temp 97.2°F | Ht 72.0 in | Wt 246.0 lb

## 2017-07-20 VITALS — BP 118/70 | HR 88 | Temp 98.1°F | Resp 16 | Ht 72.0 in | Wt 249.0 lb

## 2017-07-20 DIAGNOSIS — L738 Other specified follicular disorders: Secondary | ICD-10-CM

## 2017-07-20 DIAGNOSIS — I10 Essential (primary) hypertension: Secondary | ICD-10-CM | POA: Diagnosis not present

## 2017-07-20 DIAGNOSIS — Z23 Encounter for immunization: Secondary | ICD-10-CM

## 2017-07-20 DIAGNOSIS — G894 Chronic pain syndrome: Secondary | ICD-10-CM

## 2017-07-20 DIAGNOSIS — M10042 Idiopathic gout, left hand: Secondary | ICD-10-CM

## 2017-07-20 MED ORDER — PREDNISONE 5 MG (21) PO TBPK: ORAL_TABLET | ORAL | 0 refills | Status: DC

## 2017-07-20 MED ORDER — CLINDAMYCIN PHOSPHATE 1 % EX GEL: Freq: Two times a day (BID) | CUTANEOUS | 11 refills | Status: DC

## 2017-07-20 MED ORDER — HYDROCODONE-ACETAMINOPHEN 5-325 MG PO TABS: ORAL_TABLET | ORAL | 0 refills | Status: DC

## 2017-07-20 MED ORDER — COLCHICINE 0.6 MG PO TABS: ORAL_TABLET | ORAL | 5 refills | Status: DC

## 2017-07-20 NOTE — Progress Notes (Signed)
Patient [name], male DOB:March 26, 1951, 66 y.o. [id number]  Chief Complaint  Patient presents with  . Hand Pain    left pain     HPI  [name] is a 66 y.o. male who has gout and chronic pain. He cannot take NSAIDs.  He is taking his Uloric. He is out of colchicine.  I will give prednisone dose pack today as he has more swelling of the right wrist and hand.  He has no new trauma.   HPI  Body mass index is 33.36 kg/m.  ROS  Review of Systems  HENT: Negative for congestion.   Respiratory: Negative for cough and shortness of breath.   Cardiovascular: Negative for chest pain and leg swelling.  Endocrine: Positive for cold intolerance.  Musculoskeletal: Positive for arthralgias and joint swelling.  Allergic/Immunologic: Positive for environmental allergies.    Past Medical History:  Diagnosis Date  . Anemia    FeDA: GASTRIC POLYPS, B12; TCS 2008 EGD 2009, 2008-HB 11.1 MCV 83.6 CR 1.22, 2009 FERRITIN 102-22  . B12 deficiency   . Carcinoid tumor determined by biopsy of stomach 09/25/2014  . Chronic knee pain   . Diverticulosis of colon    Lower GI bleed 2008  . Essential hypertension   . GERD (gastroesophageal reflux disease)   . Gout   . Hepatomegaly    Hepatic steatosis  . History of alcohol abuse   . History of septic arthritis   . Hypothyroidism   . PUD   . Substance abuse [facility])     Past Surgical History:  Procedure Laterality Date  . BIOPSY  04/21/2016   Performed by Danie Binder, MD at [place]  . BIOPSY  03/12/2015   Performed by [facility]ie Binder, MD at [facility]  . BIOPSY  08/14/2014   Performed by [facility]ie Binder, MD at [facility]  . CHOLECYSTECTOMY    . COLONOSCOPY  2008 [facility] DJ   LGIB 2o to TICS, prep good  . COLONOSCOPY WITH PROPOFOL N/A 11/06/2014   Performed by Danie Binder, MD at [facility]  . COLONOSCOPY WITH PROPOFOL at cecum at 0838; withdrawal time=36 minutes N/A 08/14/2014   Performed by Danie Binder, MD at [facility]  .  ESOPHAGOGASTRODUODENOSCOPY  12/08/2007   KDT:OIZT gastric polyps seen in the cardia and body of the stomach/Normal esophagus without evidence of Barrett's, mass, erosion/Normal duodenal bulb and second portion of the duodenum. Benign bx.  . ESOPHAGOGASTRODUODENOSCOPY (EGD) WITH PROPOFOL N/A 04/21/2016   Performed by Danie Binder, MD at [place]  . ESOPHAGOGASTRODUODENOSCOPY (EGD) WITH PROPOFOL N/A 10/08/2015   Performed by Danie Binder, MD at [place]  . ESOPHAGOGASTRODUODENOSCOPY (EGD) WITH PROPOFOL N/A 03/12/2015   Performed by [facility]ie Binder, MD at [facility]  . ESOPHAGOGASTRODUODENOSCOPY (EGD) WITH PROPOFOL N/A 08/14/2014   Performed by [facility]ie Binder, MD at [facility]  . HAND SURGERY    . JOINT REPLACEMENT    . POLYPECTOMY N/A 11/06/2014   Performed by [facility]ie Binder, MD at [facility]  . POLYPECTOMY  08/14/2014   Performed by [facility]ie Binder, MD at [facility]  . REPLACEMENT TOTAL KNEE BILATERAL    . UPPER GASTROINTESTINAL ENDOSCOPY  APR 2009   INFLAMED HYPERPLASTIC POLYPS, CHRONIC GASTRITIS    Family History  Problem Relation Age of Onset  . Diabetes Mother   . Renal Disease Mother        failure  . Diabetes Father        '  old age'  . Hypertension Sister   . Hypertension Brother   . Diabetes Brother   . Diabetes Sister   . Diabetes Sister   . Diabetes Brother   . Hypertension Brother   . Diabetes Brother        right BKA  . Kidney failure Brother        on dialysis  . Kidney failure Brother        on dialysis  . Diabetes Brother        bilateral BKA  . Colon polyps Neg Hx   . Colon cancer Neg Hx   . Pancreatic disease Neg Hx     Social History Social History   Tobacco Use  . Smoking status: Never Smoker  . Smokeless tobacco: Never Used  . Tobacco comment: Quit x 10 years/ never smoked on regular basis  Substance Use Topics  . Alcohol use: Yes    Alcohol/week: 1.2 oz    Types: 2 Shots of liquor per week    Comment: drinks on weekends, gin/vodka 1/5th.  .  Drug use: No    Allergies  Allergen Reactions  . Aspirin     REACTION: Diverticular Bleed    Current Outpatient Medications  Medication Sig Dispense Refill  . acetaminophen (TYLENOL) 500 MG tablet Take 500 mg by mouth every 6 (six) hours as needed for moderate pain.    Marland Kitchen amLODipine (NORVASC) 10 MG tablet TAKE 1 TABLET BY MOUTH ONCE A DAY. 90 tablet 3  . atorvastatin (LIPITOR) 20 MG tablet Take 1 tablet (20 mg total) by mouth daily. 90 tablet 3  . colchicine 0.6 MG tablet TAKE 1 TABLET BY MOUTH 3 TIMES DAILY X5 DAYS FOR GOUT PAIN. 15 tablet 5  . febuxostat (ULORIC) 40 MG tablet Take 40 mg by mouth daily.    Marland Kitchen gabapentin (NEURONTIN) 300 MG capsule Take 300 mg by mouth 2 (two) times daily.     Marland Kitchen HYDROcodone-acetaminophen (NORCO/VICODIN) 5-325 MG tablet One tablet every six hours as needed for acute pain.  Must last 14 days. 55 tablet 0  . levothyroxine (SYNTHROID, LEVOTHROID) 200 MCG tablet TAKE 1 TABLET DAILY BEFORE BREAKFAST. 90 tablet 3  . metoprolol succinate (TOPROL XL) 25 MG 24 hr tablet Take 1 tablet (25 mg total) by mouth daily. 90 tablet 3  . omeprazole (PRILOSEC) 20 MG capsule Take 1 capsule (20 mg total) by mouth daily. 30 capsule 11  . penicillin v potassium (VEETID) 500 MG tablet Take 1 tablet (500 mg total) by mouth 3 (three) times daily. 30 tablet 0  . Polysacchar Iron-FA-B12 (FERREX 150 FORTE) 150-1-25 MG-MG-MCG CAPS Take 1 capsule by mouth daily. 90 capsule 4  . potassium chloride SA (K-DUR,KLOR-CON) 20 MEQ tablet Take 1 tablet (20 mEq total) by mouth daily. 30 tablet 0  . tamsulosin (FLOMAX) 0.4 MG CAPS capsule Take 1 capsule (0.4 mg total) by mouth daily. 90 capsule 3  . Vitamin D, Ergocalciferol, (DRISDOL) 50000 units CAPS capsule Take 50,000 Units by mouth once a week.  0  . amLODipine (NORVASC) 5 MG tablet Take 1 tablet (5 mg total) by mouth daily. (Patient not taking: Reported on 07/20/2017) 90 tablet 3  . predniSONE (STERAPRED UNI-PAK 21 TAB) 5 MG (21) TBPK tablet Take  6 pills first day; 5 pills second day; 4 pills third day; 3 pills fourth day; 2 pills next day and 1 pill last day. 21 tablet 0   No current facility-administered medications for this visit.  Physical Exam  Blood pressure (!) 150/85, pulse (!) 103, temperature (!) 97.2 F (36.2 C), height 6' (1.829 m), weight 246 lb (111.6 kg).  Constitutional: overall normal hygiene, normal nutrition, well developed, normal grooming, normal body habitus. Assistive device:none  Musculoskeletal: gait and station Limp none, muscle tone and strength are normal, no tremors or atrophy is present.  .  Neurological: coordination overall normal.  Deep tendon reflex/nerve stretch intact.  Sensation normal.  Cranial nerves II-XII intact.   Skin:   Normal overall no scars, lesions, ulcers or rashes. No psoriasis.  Psychiatric: Alert and oriented x 3.  Recent memory intact, remote memory unclear.  Normal mood and affect. Well groomed.  Good eye contact.  Cardiovascular: overall no swelling, no varicosities, no edema bilaterally, normal temperatures of the legs and arms, no clubbing, cyanosis and good capillary refill.  Lymphatic: palpation is normal.  All other systems reviewed and are negative   The right hand and right wrist have some slight swelling.  NV intact. ROM is full.  Grips are normal.  The patient has been educated about the nature of the problem(s) and counseled on treatment options.  The patient appeared to understand what I have discussed and is in agreement with it.  Encounter Diagnoses  Name Primary?  . Acute idiopathic gout of left hand Yes  . Chronic pain disorder     PLAN Call if any problems.  Precautions discussed.  Continue current medications.   Return to clinic 1 month   I have reviewed the [place] web site prior to prescribing narcotic medicine for this patient.  Electronically Signed Sanjuana Kava, MD 11/20/20189:12  AM

## 2017-07-20 NOTE — Patient Instructions (Addendum)
Stop the amlodipine 10 mg Only take the amlodipine 5 mg This was reduced by Dr Domenic Polite  Take the prednisone ordered by Dr Luna Glasgow This is for arthritis pain in your hands  I am refilling the antibiotic pills for your neck bumps Start washing the area every day with antibacterial soap Apply the cleocin (antibiotic solution) to the area once every morning and night This will help prevent future outbreaks  See me on usual schedule

## 2017-07-20 NOTE — Progress Notes (Signed)
Chief Complaint  Patient presents with  . Mass    head and neck   Patient is here requesting an appointment.  He walked in this morning and was worked in this afternoon.  He has a history of recurring folliculitis.  At 1 of his office visits I gave him a few days of doxycycline and this made it better.  He called for refill last week and I would not do that without seeing him.  Today we discussed treating the folliculitis, and preventing it with scrubbing the area with antibacterial soap and using the clindamycin gel on the area to prevent infection.  The patient has a low educational level, low comprehension, and is illiterate.  I gave him these instructions also in writing so his aide will assist him. As we reconcile all the medicines he brought in in his bag, it is noted that he is on amlodipine 10 mg a day and amlodipine 5 mg a day.  I reviewed the medical record and Dr. Myles Gip last note.  He reduce the amlodipine from 10 to 5 a day and added metoprolol because of patient's history of arrhythmias.  The patient's blood pressure today is 118/70.  I believe he can reduce the amlodipine and we threw away the bottle of 10 mg a day so he would NOT be confused.  Patient Active Problem List   Diagnosis Date Noted  . Hyperlipidemia 10/23/2016  . Mild intellectual disabilities (CODE) 09/29/2016  . Personal history of gout 09/22/2016  . Literacy level of illiterate 09/22/2016  . History of bilateral knee replacement 09/22/2016  . Carcinoid tumor determined by biopsy of stomach 04/10/2016  . Tubulovillous adenoma of large intestine 09/12/2015  . Neuroendocrine tumor 09/25/2014  . Elevated LFTs 07/17/2014  . Iron deficiency anemia 07/29/2010  . Fatty liver 07/29/2010  . ABUSE, ALCOHOL, UNSPECIFIED 11/30/2008  . Osteoarthrosis involving lower leg 08/20/2008  . Essential hypertension 10/20/2006  . B12 deficiency 08/15/2006  . GERD 08/15/2006  . IBS 08/15/2006  . BPH without  obstruction/lower urinary tract symptoms 08/15/2006  . OSTEOARTHRITIS 08/15/2006  . DEGENERATION, DISC NOS 08/15/2006    Outpatient Encounter Medications as of 07/20/2017  Medication Sig  . acetaminophen (TYLENOL) 500 MG tablet Take 500 mg by mouth every 6 (six) hours as needed for moderate pain.  Marland Kitchen amLODipine (NORVASC) 5 MG tablet Take 1 tablet (5 mg total) by mouth daily.  Marland Kitchen atorvastatin (LIPITOR) 20 MG tablet Take 1 tablet (20 mg total) by mouth daily.  . colchicine 0.6 MG tablet TAKE 1 TABLET BY MOUTH 3 TIMES DAILY X5 DAYS FOR GOUT PAIN.  . febuxostat (ULORIC) 40 MG tablet Take 40 mg by mouth daily.  Marland Kitchen gabapentin (NEURONTIN) 300 MG capsule Take 300 mg by mouth 2 (two) times daily.   Marland Kitchen levothyroxine (SYNTHROID, LEVOTHROID) 200 MCG tablet TAKE 1 TABLET DAILY BEFORE BREAKFAST.  . metoprolol succinate (TOPROL-XL) 25 MG 24 hr tablet Take 25 mg by mouth daily.  Marland Kitchen omeprazole (PRILOSEC) 20 MG capsule Take 1 capsule (20 mg total) by mouth daily.  . tamsulosin (FLOMAX) 0.4 MG CAPS capsule Take 1 capsule (0.4 mg total) by mouth daily.  . clindamycin (CLINDAGEL) 1 % gel Apply topically 2 (two) times daily.  Marland Kitchen ] predniSONE (STERAPRED UNI-PAK 21 TAB) 5 MG (21) TBPK tablet Take 6 pills first day; 5 pills second day; 4 pills third day; 3 pills fourth day; 2 pills next day and 1 pill last day. (Patient not taking: Reported on 07/20/2017) just  prescribed 07/20/2017, not yet picked up at pharmacy   No facility-administered encounter medications on file as of 07/20/2017.     Allergies  Allergen Reactions  . Aspirin     REACTION: Diverticular Bleed    Review of Systems  Constitutional: Negative for activity change, appetite change, chills, fever and unexpected weight change.  HENT: Negative.  Negative for dental problem.   Eyes: Negative.  Negative for visual disturbance.  Respiratory: Negative.  Negative for cough and shortness of breath.   Cardiovascular: Negative for chest pain, palpitations and  leg swelling.  Gastrointestinal: Negative.  Negative for diarrhea, nausea and vomiting.  Genitourinary: Negative for decreased urine volume and frequency.  Musculoskeletal: Positive for arthralgias and joint swelling.  Skin: Positive for rash. Negative for color change.  Neurological: Negative for dizziness and light-headedness.  Psychiatric/Behavioral: Negative for decreased concentration and dysphoric mood. The patient is not nervous/anxious.     BP 118/70   Pulse 88   Temp 98.1 F (36.7 C) (Temporal)   Resp 16   Ht 6' (1.829 m)   Wt 249 lb 0.6 oz (113 kg)   SpO2 98%   BMI 33.78 kg/m   Physical Exam  Constitutional: He appears well-developed and well-nourished.  Slow responses, cooperative  HENT:  Head: Normocephalic and atraumatic.  Mouth/Throat: Oropharynx is clear and moist.  Eyes: Conjunctivae are normal. Pupils are equal, round, and reactive to light.  Neck: Normal range of motion. Neck supple.  Cardiovascular: Normal rate, regular rhythm and normal heart sounds.  Pulmonary/Chest: Effort normal and breath sounds normal.  Musculoskeletal:  Both hands with swelling, warmth and synovitis  Lymphadenopathy:    He has no cervical adenopathy.  Skin: Skin is warm and dry.  Multiple infection follicles in hair and around back of neck at hairline.  Few are large - small abscess sized (1.5 cm across) and tender.  No cellulitis or adenopathy  Psychiatric: His behavior is normal.  Poor fund of knowlege    ASSESSMENT/PLAN:  1. Folliculitis barbae Discussed treatment Discussed prevention Put instructions in writing for aide  2. Essential hypertension Controlled.  Reduce the amlodipine to 5 mg a day ( stop the 10 mg)  3. Need for pneumococcal vaccination done - Pneumococcal conjugate vaccine 13-valent IM FLU SHOT REFUSED  Patient Instructions  Stop the amlodipine 10 mg Only take the amlodipine 5 mg This was reduced by Dr Domenic Polite  Take the prednisone ordered by Dr  Luna Glasgow This is for arthritis pain in your hands  I am refilling the antibiotic pills for your neck bumps Start washing the area every day with antibacterial soap Apply the cleocin (antibiotic solution) to the area once every morning and night This will help prevent future outbreaks  See me on usual schedule     Raylene Everts, MD

## 2017-07-26 NOTE — Progress Notes (Signed)
Cardiology Office Note  Date: 07/27/2017   ID: [name], DOB 1951/06/22, MRN [id number]  PCP: Raylene Everts, MD  Primary Cardiologist: Rozann Lesches, MD   Chief Complaint  Patient presents with  . PSVT    History of Present Illness: [name] is a 66 y.o. male that I met in consultation back in September for management of SVT.  He presents today for a follow-up visit.  Reports no palpitations or chest pain and states that he has tolerated his current medications.  He has had no dizziness or syncope.  Toprol-XL 25 mg was initiated at the last visit and an echocardiogram was obtained.  LVEF was 55-60% range with moderately dilated left atrium and mild to moderate tricuspid regurgitation associated with PASP 46 mmHg.   Past Medical History:  Diagnosis Date  . Anemia    FeDA: GASTRIC POLYPS, B12; TCS 2008 EGD 2009, 2008-HB 11.1 MCV 83.6 CR 1.22, 2009 FERRITIN 102-22  . B12 deficiency   . Carcinoid tumor determined by biopsy of stomach 09/25/2014  . Chronic knee pain   . Diverticulosis of colon    Lower GI bleed 2008  . Essential hypertension   . GERD (gastroesophageal reflux disease)   . Gout   . Hepatomegaly    Hepatic steatosis  . History of alcohol abuse   . History of septic arthritis   . Hypothyroidism   . PUD   . Substance abuse [facility])     Past Surgical History:  Procedure Laterality Date  . BIOPSY  08/14/2014   Procedure: BIOPSY;  Surgeon: Danie Binder, MD;  [facility];  Service: Endoscopy;;  . BIOPSY  03/12/2015   Procedure: BIOPSY;  Surgeon: Danie Binder, MD;  Location: [facility];  Service: Endoscopy;;  . BIOPSY  04/21/2016   Procedure: BIOPSY;  Surgeon: Danie Binder, MD;  [facility];  Service: Endoscopy;;  bx's of antrum, body of stomach, fundus, and cardia  . CHOLECYSTECTOMY    . COLONOSCOPY  2008 [facility] DJ   LGIB 2o to TICS, prep good  . COLONOSCOPY WITH PROPOFOL N/A 08/14/2014   SLF: 1. Four large colon polyps removed. 2.  The left colon is redundant 3. Moderate diverticulosis throughout teh entire examined colon  . COLONOSCOPY WITH PROPOFOL N/A 11/06/2014   SLF: 8 small polyps removed. One large pedunculated polyp removed from the ascending colon, tubulovillous and tubular adenomas. Next colonoscopy March 2019  . ESOPHAGOGASTRODUODENOSCOPY  12/08/2007   VVK:PQAE gastric polyps seen in the cardia and body of the stomach/Normal esophagus without evidence of Barrett's, mass, erosion/Normal duodenal bulb and second portion of the duodenum. Benign bx.  . ESOPHAGOGASTRODUODENOSCOPY (EGD) WITH PROPOFOL N/A 08/14/2014   SLF: 1. Heme postive stools due to gastric and colon polyps 2. Multiple gastric   . ESOPHAGOGASTRODUODENOSCOPY (EGD) WITH PROPOFOL N/A 03/12/2015   SLF: Multiple gastric nodules seen in gasric body/antrum. 2. Non-erosive gastritis ( inflammation) was found in the gastric antrum.   . ESOPHAGOGASTRODUODENOSCOPY (EGD) WITH PROPOFOL N/A 10/08/2015   Procedure: ESOPHAGOGASTRODUODENOSCOPY (EGD) WITH PROPOFOL;  Surgeon: Danie Binder, MD;  [facility];  Service: Endoscopy;  Laterality: N/A;  0945  . ESOPHAGOGASTRODUODENOSCOPY (EGD) WITH PROPOFOL N/A 04/21/2016   Procedure: ESOPHAGOGASTRODUODENOSCOPY (EGD) WITH PROPOFOL;  Surgeon: Danie Binder, MD;  [facility];  Service: Endoscopy;  Laterality: N/A;  730   . HAND SURGERY    . JOINT REPLACEMENT    . POLYPECTOMY  08/14/2014   Procedure: POLYPECTOMY;  Surgeon: Danie Binder, MD;  Location: [facility];  Service: Endoscopy;;  . POLYPECTOMY N/A 11/06/2014   Procedure: POLYPECTOMY;  Surgeon: Danie Binder, MD;  Location: [facility];  Service: Endoscopy;  Laterality: N/A;  Transverse Colon x3, Ascending Colon x2, Descending Colon x3  . REPLACEMENT TOTAL KNEE BILATERAL    . UPPER GASTROINTESTINAL ENDOSCOPY  APR 2009   INFLAMED HYPERPLASTIC POLYPS, CHRONIC GASTRITIS    Current Outpatient Medications  Medication Sig Dispense Refill  . acetaminophen  (TYLENOL) 500 MG tablet Take 500 mg by mouth every 6 (six) hours as needed for moderate pain.    Marland Kitchen amLODipine (NORVASC) 5 MG tablet Take 1 tablet (5 mg total) by mouth daily. 90 tablet 3  . atorvastatin (LIPITOR) 20 MG tablet Take 1 tablet (20 mg total) by mouth daily. 90 tablet 3  . clindamycin (CLINDAGEL) 1 % gel Apply topically 2 (two) times daily. 30 g 11  . colchicine 0.6 MG tablet TAKE 1 TABLET BY MOUTH 3 TIMES DAILY X5 DAYS FOR GOUT PAIN. 15 tablet 5  . febuxostat (ULORIC) 40 MG tablet Take 40 mg by mouth daily.    Marland Kitchen gabapentin (NEURONTIN) 300 MG capsule Take 300 mg by mouth 2 (two) times daily.     Marland Kitchen HYDROcodone-acetaminophen (NORCO/VICODIN) 5-325 MG tablet One tablet every six hours as needed for acute pain.  Must last 14 days. 55 tablet 0  . levothyroxine (SYNTHROID, LEVOTHROID) 200 MCG tablet TAKE 1 TABLET DAILY BEFORE BREAKFAST. 90 tablet 3  . metoprolol succinate (TOPROL-XL) 25 MG 24 hr tablet Take 25 mg by mouth daily.    Marland Kitchen omeprazole (PRILOSEC) 20 MG capsule Take 1 capsule (20 mg total) by mouth daily. 30 capsule 11  . tamsulosin (FLOMAX) 0.4 MG CAPS capsule Take 1 capsule (0.4 mg total) by mouth daily. 90 capsule 3   No current facility-administered medications for this visit.    Allergies:  Aspirin   Social History: The patient  reports that  has never smoked. he has never used smokeless tobacco. He reports that he drinks about 1.2 oz of alcohol per week. He reports that he does not use drugs.   ROS:  Please see the history of present illness. Otherwise, complete review of systems is positive for arthritic stiffness.  All other systems are reviewed and negative.   Physical Exam: VS:  BP 130/76 (BP Location: Right Arm)   Pulse 71   Ht 6' 0.6" (1.844 m)   Wt 249 lb (112.9 kg)   SpO2 94%   BMI 33.21 kg/m , BMI Body mass index is 33.21 kg/m.  Wt Readings from Last 3 Encounters:  07/27/17 249 lb (112.9 kg)  07/20/17 249 lb 0.6 oz (113 kg)  07/20/17 246 lb (111.6 kg)      General: Obese male, appears comfortable at rest. HEENT: Conjunctiva and lids normal, oropharynx clear. Neck: Supple, no elevated JVP or carotid bruits, no thyromegaly. Lungs: Clear to auscultation, nonlabored breathing at rest. Cardiac: Regular rate and rhythm, no S3, soft systolic murmur, no pericardial rub. Abdomen: Soft, nontender, bowel sounds present, no guarding or rebound. Extremities: No pitting edema, distal pulses 2+. Skin: Warm and dry. Musculoskeletal: No kyphosis. Neuropsychiatric: Alert and oriented x3, affect grossly appropriate.  ECG: I personally reviewed the tracing from 04/28/2017 which shows sinus sinus rhythm with frequent PACs, ventricular couplet, and burst of narrow complex SVT.  Recent Labwork: 01/20/2017: TSH 2.81 03/22/2017: ALT 15; AST 20; BUN 17; Creatinine, Ser 1.11; Hemoglobin 12.0; Platelets 164; Potassium 3.3;  Sodium 137     Component Value Date/Time   CHOL 150 01/20/2017 0926   TRIG 154 (H) 01/20/2017 0926   HDL 29 (L) 01/20/2017 0926   CHOLHDL 5.2 (H) 01/20/2017 0926   VLDL 31 (H) 01/20/2017 0926   LDLCALC 90 01/20/2017 0926    Other Studies Reviewed Today:  Echocardiogram 05/26/2017: Study Conclusions  - Left ventricle: The cavity size was normal. Wall thickness was   normal. Systolic function was normal. The estimated ejection   fraction was in the range of 55% to 60%. Diastolic function is   abnormal, indeterminant grade. Wall motion was normal; there were   no regional wall motion abnormalities. - Aortic valve: Valve area (VTI): 2.39 cm^2. Valve area (Vmax):   2.39 cm^2. Valve area (Vmean): 2.11 cm^2. - Left atrium: The atrium was moderately dilated. - Atrial septum: No defect or patent foramen ovale was identified. - Tricuspid valve: There was mild-moderate regurgitation. - Pulmonary arteries: Systolic pressure was moderately increased.   PA peak pressure: 46 mm Hg (S). - Technically adequate study.  Assessment and Plan:  1.   PSVT, no complaints of palpitations and heart rate is in the 70s today on Toprol-XL which she has been tolerating.  No changes made to current regimen.  Would continue observation at this point.  LVEF normal by recent echocardiogram.  2.  Essential hypertension, continues on Norvasc with follow-up per Dr. Meda Coffee.  Current medicines were reviewed with the patient today.  Disposition: Follow-up in 1 year, sooner if needed.  Signed, Satira Sark, MD, [facility] 07/27/2017 1:30 PM    [facility] at [facility] [address] [place], [place] [address] Phone: [phone]; Fax: [phone]

## 2017-07-27 ENCOUNTER — Ambulatory Visit (INDEPENDENT_AMBULATORY_CARE_PROVIDER_SITE_OTHER): Payer: Medicare Other | Admitting: Cardiology

## 2017-07-27 ENCOUNTER — Encounter: Payer: Self-pay | Admitting: Cardiology

## 2017-07-27 VITALS — BP 130/76 | HR 71 | Ht 72.6 in | Wt 249.0 lb

## 2017-07-27 DIAGNOSIS — I10 Essential (primary) hypertension: Secondary | ICD-10-CM | POA: Diagnosis not present

## 2017-07-27 DIAGNOSIS — I471 Supraventricular tachycardia: Secondary | ICD-10-CM | POA: Diagnosis not present

## 2017-07-27 NOTE — Patient Instructions (Addendum)
Your physician wants you to follow-up in: 1 year with Dr.McDowell You will receive a reminder letter in the mail two months in advance. If you don't receive a letter, please call our office to schedule the follow-up appointment.    Your physician recommends that you continue on your current medications as directed. Please refer to the Current Medication list given to you today.    If you need a refill on your cardiac medications before your next appointment, please call your pharmacy.     No lab work or tests ordered today.      Thank you for choosing [facility] !        

## 2017-07-30 ENCOUNTER — Ambulatory Visit: Payer: Medicare Other | Admitting: Family Medicine

## 2017-08-03 ENCOUNTER — Encounter: Payer: Self-pay | Admitting: Family Medicine

## 2017-08-03 ENCOUNTER — Other Ambulatory Visit: Payer: Self-pay | Admitting: Orthopaedic Surgery

## 2017-08-03 ENCOUNTER — Other Ambulatory Visit: Payer: Self-pay

## 2017-08-03 ENCOUNTER — Ambulatory Visit (INDEPENDENT_AMBULATORY_CARE_PROVIDER_SITE_OTHER): Payer: Medicare Other | Admitting: Family Medicine

## 2017-08-03 VITALS — BP 126/70 | HR 84 | Temp 98.3°F | Resp 16 | Ht 73.0 in | Wt 251.1 lb

## 2017-08-03 DIAGNOSIS — I10 Essential (primary) hypertension: Secondary | ICD-10-CM

## 2017-08-03 DIAGNOSIS — E785 Hyperlipidemia, unspecified: Secondary | ICD-10-CM

## 2017-08-03 DIAGNOSIS — N400 Enlarged prostate without lower urinary tract symptoms: Secondary | ICD-10-CM | POA: Diagnosis not present

## 2017-08-03 DIAGNOSIS — E039 Hypothyroidism, unspecified: Secondary | ICD-10-CM | POA: Diagnosis not present

## 2017-08-03 DIAGNOSIS — E538 Deficiency of other specified B group vitamins: Secondary | ICD-10-CM | POA: Diagnosis not present

## 2017-08-03 DIAGNOSIS — D500 Iron deficiency anemia secondary to blood loss (chronic): Secondary | ICD-10-CM

## 2017-08-03 MED FILL — FERREX 150 FORTE CAPSULE: 150-1-25 | 90 days supply | Qty: 90 | Fill #2

## 2017-08-03 NOTE — Progress Notes (Signed)
Chief Complaint  Patient presents with  . Follow-up    3 month   Patient is here for his routine follow-up. When last seen he was treated for folliculitis.  The topical clindamycin is working well.  He states his skin is much improved.  He is pleased with the result He was sent to cardiology for tachycardia.  He had sinus tachycardia.  His workup was negative.  He is on a beta-blocker.  Dr. Domenic Polite released him to come back yearly. He feels well today.  His blood pressure is well controlled.  He has no complaint.  His arthritic pains are at their baseline. We discussed a flu shot.  I explained that it is important.  He refuses. He is compliant with his medications.  He states he walks every day.  His weight is heavy, but stable.  Patient Active Problem List   Diagnosis Date Noted  . Hyperlipidemia 10/23/2016  . Mild intellectual disabilities (CODE) 09/29/2016  . Personal history of gout 09/22/2016  . Literacy level of illiterate 09/22/2016  . History of bilateral knee replacement 09/22/2016  . Carcinoid tumor determined by biopsy of stomach 04/10/2016  . Tubulovillous adenoma of large intestine 09/12/2015  . Neuroendocrine tumor 09/25/2014  . Elevated LFTs 07/17/2014  . Iron deficiency anemia 07/29/2010  . Fatty liver 07/29/2010  . ABUSE, ALCOHOL, UNSPECIFIED 11/30/2008  . Osteoarthrosis involving lower leg 08/20/2008  . Essential hypertension 10/20/2006  . B12 deficiency 08/15/2006  . GERD 08/15/2006  . IBS 08/15/2006  . BPH without obstruction/lower urinary tract symptoms 08/15/2006  . OSTEOARTHRITIS 08/15/2006  . DEGENERATION, DISC NOS 08/15/2006    Outpatient Encounter Medications as of 08/03/2017  Medication Sig  . acetaminophen (TYLENOL) 500 MG tablet Take 500 mg by mouth every 6 (six) hours as needed for moderate pain.  Marland Kitchen amLODipine (NORVASC) 5 MG tablet Take 1 tablet (5 mg total) by mouth daily.  Marland Kitchen atorvastatin (LIPITOR) 20 MG tablet Take 1 tablet (20 mg total)  by mouth daily.  . clindamycin (CLINDAGEL) 1 % gel Apply topically 2 (two) times daily.  . colchicine 0.6 MG tablet TAKE 1 TABLET BY MOUTH 3 TIMES DAILY X5 DAYS FOR GOUT PAIN.  . febuxostat (ULORIC) 40 MG tablet Take 40 mg by mouth daily.  Marland Kitchen gabapentin (NEURONTIN) 300 MG capsule Take 300 mg by mouth 2 (two) times daily.   Marland Kitchen HYDROcodone-acetaminophen (NORCO/VICODIN) 5-325 MG tablet One tablet every six hours as needed for acute pain.  Must last 14 days.  Marland Kitchen levothyroxine (SYNTHROID, LEVOTHROID) 200 MCG tablet TAKE 1 TABLET DAILY BEFORE BREAKFAST.  . metoprolol succinate (TOPROL-XL) 25 MG 24 hr tablet Take 25 mg by mouth daily.  Marland Kitchen omeprazole (PRILOSEC) 20 MG capsule Take 1 capsule (20 mg total) by mouth daily.  . tamsulosin (FLOMAX) 0.4 MG CAPS capsule Take 1 capsule (0.4 mg total) by mouth daily.   No facility-administered encounter medications on file as of 08/03/2017.     Allergies  Allergen Reactions  . Aspirin Other (See Comments)    REACTION: Diverticular Bleed    Review of Systems  Constitutional: Negative for activity change, appetite change, chills, fever and unexpected weight change.  HENT: Negative.  Negative for dental problem.   Eyes: Negative.  Negative for visual disturbance.  Respiratory: Negative.  Negative for cough and shortness of breath.   Cardiovascular: Negative for chest pain, palpitations and leg swelling.       No tachycardia  Gastrointestinal: Negative.  Negative for diarrhea, nausea and vomiting.  Genitourinary: Negative for decreased urine volume and frequency.  Musculoskeletal: Positive for arthralgias and joint swelling.       Chronic, stable  Skin: Positive for rash. Negative for color change.       Improved  Neurological: Negative for dizziness and light-headedness.  Psychiatric/Behavioral: Negative for decreased concentration and dysphoric mood. The patient is not nervous/anxious.     BP 126/70 (BP Location: Right Arm, Patient Position: Sitting, Cuff  Size: Large)   Pulse 84   Temp 98.3 F (36.8 C) (Temporal)   Resp 16   Ht 6\' 1"  (1.854 m)   Wt 251 lb 1.9 oz (113.9 kg)   SpO2 98%   BMI 33.13 kg/m   Physical Exam  Constitutional: He appears well-developed and well-nourished.  Slow responses, cooperative  HENT:  Head: Normocephalic and atraumatic.  Mouth/Throat: Oropharynx is clear and moist.  Eyes: Conjunctivae are normal. Pupils are equal, round, and reactive to light.  Neck: Normal range of motion. Neck supple.  Cardiovascular: Normal rate, regular rhythm and normal heart sounds.  Pulmonary/Chest: Effort normal and breath sounds normal.  Musculoskeletal: Normal range of motion. He exhibits no edema.  No synovitis  Lymphadenopathy:    He has no cervical adenopathy.  Skin: Skin is warm and dry.  Multiple infection follicles in hair and around back of neck at hairline.  All are small, few pustules, much improved  Psychiatric: His behavior is normal.  Poor fund of knowlege    ASSESSMENT/PLAN:  1. Essential hypertension Well-controlled - COMPLETE METABOLIC PANEL WITH GFR - Urinalysis, Routine w reflex microscopic  2. BPH without obstruction/lower urinary tract symptoms By history.  Discussed PSA.  Patient would like this done.  Explained pros and cons.  Last PSA was in 2010.  No prostate cancer in the family. - PSA  3. Hyperlipidemia, unspecified hyperlipidemia type Stable on statin - Lipid panel  4. B12 deficiency By history. - B12  5. Iron deficiency anemia due to chronic blood loss By history. - CBC - Iron  6. Hypothyroidism, unspecified type Replaced, on Synthroid. - TSH   Patient Instructions  You are doing well No change in medicine Blood pressure is good See me in 2-3 months Need lab tests next visit   Raylene Everts, MD

## 2017-08-03 NOTE — Patient Instructions (Signed)
You are doing well No change in medicine Blood pressure is good See me in 2-3 months Need lab tests next visit

## 2017-08-03 NOTE — Telephone Encounter (Signed)
No   He just had some recently.

## 2017-08-04 ENCOUNTER — Encounter: Payer: Self-pay | Admitting: Family Medicine

## 2017-08-06 NOTE — Telephone Encounter (Signed)
Relayed to patient.

## 2017-08-17 ENCOUNTER — Encounter: Payer: Self-pay | Admitting: Orthopaedic Surgery

## 2017-08-17 ENCOUNTER — Ambulatory Visit (INDEPENDENT_AMBULATORY_CARE_PROVIDER_SITE_OTHER): Payer: Medicare Other | Admitting: Orthopaedic Surgery

## 2017-08-17 VITALS — BP 141/89 | HR 80 | Temp 97.8°F | Ht 73.0 in | Wt 242.0 lb

## 2017-08-17 DIAGNOSIS — M10042 Idiopathic gout, left hand: Secondary | ICD-10-CM

## 2017-08-17 DIAGNOSIS — G894 Chronic pain syndrome: Secondary | ICD-10-CM

## 2017-08-17 MED ORDER — HYDROCODONE-ACETAMINOPHEN 5-325 MG PO TABS: ORAL_TABLET | ORAL | 0 refills | Status: DC

## 2017-08-17 NOTE — Telephone Encounter (Signed)
No

## 2017-08-17 NOTE — Progress Notes (Signed)
Patient [name], male DOB:Feb 26, 1951, 66 y.o. FTD:[id number]  Chief Complaint  Patient presents with  . Follow-up    Gout    HPI  [name] is a 66 y.o. male who has gout.  He has less pain of the left wrist and hand now.  He has pain some days that makes him stop what he is doing. He is taking his Uloric and the colchicine.  He has no new trauma.  He has pain medicine for the "bad" days. HPI  Body mass index is 31.93 kg/m.  ROS  Review of Systems  HENT: Negative for congestion.   Respiratory: Negative for cough and shortness of breath.   Cardiovascular: Negative for chest pain and leg swelling.  Endocrine: Positive for cold intolerance.  Musculoskeletal: Positive for arthralgias and joint swelling.  Allergic/Immunologic: Positive for environmental allergies.  All other systems reviewed and are negative.   Past Medical History:  Diagnosis Date  . Anemia    FeDA: GASTRIC POLYPS, B12; TCS 2008 EGD 2009, 2008-HB 11.1 MCV 83.6 CR 1.22, 2009 FERRITIN 102-22  . B12 deficiency   . Carcinoid tumor determined by biopsy of stomach 09/25/2014  . Chronic knee pain   . Diverticulosis of colon    Lower GI bleed 2008  . Essential hypertension   . GERD (gastroesophageal reflux disease)   . Gout   . Hepatomegaly    Hepatic steatosis  . History of alcohol abuse   . History of septic arthritis   . Hypothyroidism   . PUD   . Substance abuse [facility])     Past Surgical History:  Procedure Laterality Date  . BIOPSY  08/14/2014   Procedure: BIOPSY;  Surgeon: Danie Binder, MD;  Location: [facility];  Service: Endoscopy;;  . BIOPSY  03/12/2015   Procedure: BIOPSY;  Surgeon: Danie Binder, MD;  Location: [facility];  Service: Endoscopy;;  . BIOPSY  04/21/2016   Procedure: BIOPSY;  Surgeon: Danie Binder, MD;  [facility];  Service: Endoscopy;;  bx's of antrum, body of stomach, fundus, and cardia  . CHOLECYSTECTOMY    . COLONOSCOPY  2008 [facility] DJ   LGIB 2o to TICS, prep good   . COLONOSCOPY WITH PROPOFOL N/A 08/14/2014   SLF: 1. Four large colon polyps removed. 2. The left colon is redundant 3. Moderate diverticulosis throughout teh entire examined colon  . COLONOSCOPY WITH PROPOFOL N/A 11/06/2014   SLF: 8 small polyps removed. One large pedunculated polyp removed from the ascending colon, tubulovillous and tubular adenomas. Next colonoscopy March 2019  . ESOPHAGOGASTRODUODENOSCOPY  12/08/2007   [facility]:BJSE gastric polyps seen in the cardia and body of the stomach/Normal esophagus without evidence of Barrett's, mass, erosion/Normal duodenal bulb and second portion of the duodenum. Benign bx.  . ESOPHAGOGASTRODUODENOSCOPY (EGD) WITH PROPOFOL N/A 08/14/2014   SLF: 1. Heme postive stools due to gastric and colon polyps 2. Multiple gastric   . ESOPHAGOGASTRODUODENOSCOPY (EGD) WITH PROPOFOL N/A 03/12/2015   SLF: Multiple gastric nodules seen in gasric body/antrum. 2. Non-erosive gastritis ( inflammation) was found in the gastric antrum.   . ESOPHAGOGASTRODUODENOSCOPY (EGD) WITH PROPOFOL N/A 10/08/2015   Procedure: ESOPHAGOGASTRODUODENOSCOPY (EGD) WITH PROPOFOL;  Surgeon: Danie Binder, MD;  [facility];  Service: Endoscopy;  Laterality: N/A;  0945  . ESOPHAGOGASTRODUODENOSCOPY (EGD) WITH PROPOFOL N/A 04/21/2016   Procedure: ESOPHAGOGASTRODUODENOSCOPY (EGD) WITH PROPOFOL;  Surgeon: Danie Binder, MD;  [facility];  Service: Endoscopy;  Laterality: N/A;  730   .  HAND SURGERY    . JOINT REPLACEMENT    . POLYPECTOMY  08/14/2014   Procedure: POLYPECTOMY;  Surgeon: Danie Binder, MD;  Location: [facility];  Service: Endoscopy;;  . POLYPECTOMY N/A 11/06/2014   Procedure: POLYPECTOMY;  Surgeon: Danie Binder, MD;  Location: [facility];  Service: Endoscopy;  Laterality: N/A;  Transverse Colon x3, Ascending Colon x2, Descending Colon x3  . REPLACEMENT TOTAL KNEE BILATERAL    . UPPER GASTROINTESTINAL ENDOSCOPY  APR 2009   INFLAMED HYPERPLASTIC POLYPS, CHRONIC GASTRITIS     Family History  Problem Relation Age of Onset  . Diabetes Mother   . Renal Disease Mother        failure  . Diabetes Father        'old age'  . Hypertension Sister   . Hypertension Brother   . Diabetes Brother   . Diabetes Sister   . Diabetes Sister   . Diabetes Brother   . Hypertension Brother   . Diabetes Brother        right BKA  . Kidney failure Brother        on dialysis  . Kidney failure Brother        on dialysis  . Diabetes Brother        bilateral BKA  . Colon polyps Neg Hx   . Colon cancer Neg Hx   . Pancreatic disease Neg Hx     Social History Social History   Tobacco Use  . Smoking status: Never Smoker  . Smokeless tobacco: Never Used  . Tobacco comment: Quit x 10 years/ never smoked on regular basis  Substance Use Topics  . Alcohol use: Yes    Alcohol/week: 1.2 oz    Types: 2 Shots of liquor per week    Comment: drinks on weekends, gin/vodka 1/5th.  . Drug use: No    Allergies  Allergen Reactions  . Aspirin Other (See Comments)    REACTION: Diverticular Bleed    Current Outpatient Medications  Medication Sig Dispense Refill  . acetaminophen (TYLENOL) 500 MG tablet Take 500 mg by mouth every 6 (six) hours as needed for moderate pain.    Marland Kitchen amLODipine (NORVASC) 5 MG tablet Take 1 tablet (5 mg total) by mouth daily. 90 tablet 3  . atorvastatin (LIPITOR) 20 MG tablet Take 1 tablet (20 mg total) by mouth daily. 90 tablet 3  . clindamycin (CLINDAGEL) 1 % gel Apply topically 2 (two) times daily. 30 g 11  . colchicine 0.6 MG tablet TAKE 1 TABLET BY MOUTH 3 TIMES DAILY X5 DAYS FOR GOUT PAIN. 15 tablet 5  . febuxostat (ULORIC) 40 MG tablet Take 40 mg by mouth daily.    Marland Kitchen gabapentin (NEURONTIN) 300 MG capsule Take 300 mg by mouth 2 (two) times daily.     Marland Kitchen HYDROcodone-acetaminophen (NORCO/VICODIN) 5-325 MG tablet One tablet every six hours as needed for acute pain.  Must last 14 days. 55 tablet 0  . levothyroxine (SYNTHROID, LEVOTHROID) 200 MCG tablet  TAKE 1 TABLET DAILY BEFORE BREAKFAST. 90 tablet 3  . metoprolol succinate (TOPROL-XL) 25 MG 24 hr tablet Take 25 mg by mouth daily.    Marland Kitchen omeprazole (PRILOSEC) 20 MG capsule Take 1 capsule (20 mg total) by mouth daily. 30 capsule 11  . tamsulosin (FLOMAX) 0.4 MG CAPS capsule Take 1 capsule (0.4 mg total) by mouth daily. 90 capsule 3   No current facility-administered medications for this visit.      Physical Exam  Blood pressure Marland Kitchen)  141/89, pulse 80, temperature 97.8 F (36.6 C), height 6\' 1"  (1.854 m), weight 242 lb (109.8 kg).  Constitutional: overall normal hygiene, normal nutrition, well developed, normal grooming, normal body habitus. Assistive device:none  Musculoskeletal: gait and station Limp none, muscle tone and strength are normal, no tremors or atrophy is present.  .  Neurological: coordination overall normal.  Deep tendon reflex/nerve stretch intact.  Sensation normal.  Cranial nerves II-XII intact.   Skin:   Normal overall no scars, lesions, ulcers or rashes. No psoriasis.  Psychiatric: Alert and oriented x 3.  Recent memory intact, remote memory unclear.  Normal mood and affect. Well groomed.  Good eye contact.  Cardiovascular: overall no swelling, no varicosities, no edema bilaterally, normal temperatures of the legs and arms, no clubbing, cyanosis and good capillary refill.  Lymphatic: palpation is normal.  All other systems reviewed and are negative   The left wrist has slight swelling but full ROM. It is not red.  NV intact.  Left hand with good ROM and no swelling.  The patient has been educated about the nature of the problem(s) and counseled on treatment options.  The patient appeared to understand what I have discussed and is in agreement with it.  Encounter Diagnoses  Name Primary?  . Acute idiopathic gout of left hand Yes  . Chronic pain disorder     PLAN Call if any problems.  Precautions discussed.  Continue current medications.   Return to clinic  1 month   I have reviewed the [place] web site prior to prescribing narcotic medicine for this patient.  Electronically Signed Sanjuana Kava, MD 12/18/20188:56 AM

## 2017-09-01 ENCOUNTER — Encounter: Payer: Self-pay | Admitting: Gastroenterology

## 2017-09-14 ENCOUNTER — Ambulatory Visit (INDEPENDENT_AMBULATORY_CARE_PROVIDER_SITE_OTHER): Payer: Medicare Other | Admitting: Orthopaedic Surgery

## 2017-09-14 ENCOUNTER — Encounter: Payer: Self-pay | Admitting: Orthopaedic Surgery

## 2017-09-14 VITALS — BP 124/79 | HR 83 | Ht 73.0 in | Wt 253.0 lb

## 2017-09-14 DIAGNOSIS — M10042 Idiopathic gout, left hand: Secondary | ICD-10-CM | POA: Diagnosis not present

## 2017-09-14 DIAGNOSIS — G894 Chronic pain syndrome: Secondary | ICD-10-CM

## 2017-09-14 MED ORDER — HYDROCODONE-ACETAMINOPHEN 5-325 MG PO TABS: ORAL_TABLET | ORAL | 0 refills | Status: DC

## 2017-09-14 NOTE — Progress Notes (Signed)
Patient SW:HQPRF Christians, male DOB:Mar 27, 1951, 67 y.o. FMB:[id number]  Chief Complaint  Patient presents with  . Gout    states same    HPI  [name] is a 67 y.o. male who has chronic gout and a recent flare up of the swelling of the left hand. He is taking his medicine.  He has some increased pain with his general arthritis secondary to the cold weather.  He has no numbness. HPI  Body mass index is 33.38 kg/m.  ROS  Review of Systems  HENT: Negative for congestion.   Respiratory: Negative for cough and shortness of breath.   Cardiovascular: Negative for chest pain and leg swelling.  Endocrine: Positive for cold intolerance.  Musculoskeletal: Positive for arthralgias and joint swelling.  Allergic/Immunologic: Positive for environmental allergies.  All other systems reviewed and are negative.   Past Medical History:  Diagnosis Date  . Anemia    FeDA: GASTRIC POLYPS, B12; TCS 2008 EGD 2009, 2008-HB 11.1 MCV 83.6 CR 1.22, 2009 FERRITIN 102-22  . B12 deficiency   . Carcinoid tumor determined by biopsy of stomach 09/25/2014  . Chronic knee pain   . Diverticulosis of colon    Lower GI bleed 2008  . Essential hypertension   . GERD (gastroesophageal reflux disease)   . Gout   . Hepatomegaly    Hepatic steatosis  . History of alcohol abuse   . History of septic arthritis   . Hypothyroidism   . PUD   . Substance abuse [facility])     Past Surgical History:  Procedure Laterality Date  . BIOPSY  08/14/2014   Procedure: BIOPSY;  Surgeon: Danie Binder, MD;  Location: [facility];  Service: Endoscopy;;  . BIOPSY  03/12/2015   Procedure: BIOPSY;  Surgeon: Danie Binder, MD;  Location: [facility];  Service: Endoscopy;;  . BIOPSY  04/21/2016   Procedure: BIOPSY;  Surgeon: Danie Binder, MD;  [facility];  Service: Endoscopy;;  bx's of antrum, body of stomach, fundus, and cardia  . CHOLECYSTECTOMY    . COLONOSCOPY  2008 [facility] DJ   LGIB 2o to TICS, prep good  . COLONOSCOPY WITH  PROPOFOL N/A 08/14/2014   SLF: 1. Four large colon polyps removed. 2. The left colon is redundant 3. Moderate diverticulosis throughout teh entire examined colon  . COLONOSCOPY WITH PROPOFOL N/A 11/06/2014   SLF: 8 small polyps removed. One large pedunculated polyp removed from the ascending colon, tubulovillous and tubular adenomas. Next colonoscopy March 2019  . ESOPHAGOGASTRODUODENOSCOPY  12/08/2007   TSV:XBLT gastric polyps seen in the cardia and body of the stomach/Normal esophagus without evidence of Barrett's, mass, erosion/Normal duodenal bulb and second portion of the duodenum. Benign bx.  . ESOPHAGOGASTRODUODENOSCOPY (EGD) WITH PROPOFOL N/A 08/14/2014   SLF: 1. Heme postive stools due to gastric and colon polyps 2. Multiple gastric   . ESOPHAGOGASTRODUODENOSCOPY (EGD) WITH PROPOFOL N/A 03/12/2015   SLF: Multiple gastric nodules seen in gasric body/antrum. 2. Non-erosive gastritis ( inflammation) was found in the gastric antrum.   . ESOPHAGOGASTRODUODENOSCOPY (EGD) WITH PROPOFOL N/A 10/08/2015   Procedure: ESOPHAGOGASTRODUODENOSCOPY (EGD) WITH PROPOFOL;  Surgeon: Danie Binder, MD;  [facility];  Service: Endoscopy;  Laterality: N/A;  0945  . ESOPHAGOGASTRODUODENOSCOPY (EGD) WITH PROPOFOL N/A 04/21/2016   Procedure: ESOPHAGOGASTRODUODENOSCOPY (EGD) WITH PROPOFOL;  Surgeon: Danie Binder, MD;  [facility];  Service: Endoscopy;  Laterality: N/A;  730   . HAND SURGERY    . JOINT REPLACEMENT    .  POLYPECTOMY  08/14/2014   Procedure: POLYPECTOMY;  Surgeon: Danie Binder, MD;  [facility]: [facility];  Service: Endoscopy;;  . POLYPECTOMY N/A 11/06/2014   Procedure: POLYPECTOMY;  Surgeon: Danie Binder, MD;  Location: [facility];  Service: Endoscopy;  Laterality: N/A;  Transverse Colon x3, Ascending Colon x2, Descending Colon x3  . REPLACEMENT TOTAL KNEE BILATERAL    . UPPER GASTROINTESTINAL ENDOSCOPY  APR 2009   INFLAMED HYPERPLASTIC POLYPS, CHRONIC GASTRITIS    Family History   Problem Relation Age of Onset  . Diabetes Mother   . Renal Disease Mother        failure  . Diabetes Father        'old age'  . Hypertension Sister   . Hypertension Brother   . Diabetes Brother   . Diabetes Sister   . Diabetes Sister   . Diabetes Brother   . Hypertension Brother   . Diabetes Brother        right BKA  . Kidney failure Brother        on dialysis  . Kidney failure Brother        on dialysis  . Diabetes Brother        bilateral BKA  . Colon polyps Neg Hx   . Colon cancer Neg Hx   . Pancreatic disease Neg Hx     Social History Social History   Tobacco Use  . Smoking status: Never Smoker  . Smokeless tobacco: Never Used  . Tobacco comment: Quit x 10 years/ never smoked on regular basis  Substance Use Topics  . Alcohol use: Yes    Alcohol/week: 1.2 oz    Types: 2 Shots of liquor per week    Comment: drinks on weekends, gin/vodka 1/5th.  . Drug use: No    Allergies  Allergen Reactions  . Aspirin Other (See Comments)    REACTION: Diverticular Bleed    Current Outpatient Medications  Medication Sig Dispense Refill  . acetaminophen (TYLENOL) 500 MG tablet Take 500 mg by mouth every 6 (six) hours as needed for moderate pain.    Marland Kitchen amLODipine (NORVASC) 5 MG tablet     . atorvastatin (LIPITOR) 20 MG tablet Take 1 tablet (20 mg total) by mouth daily. 90 tablet 3  . clindamycin (CLINDAGEL) 1 % gel Apply topically 2 (two) times daily. 30 g 11  . colchicine 0.6 MG tablet TAKE 1 TABLET BY MOUTH 3 TIMES DAILY X5 DAYS FOR GOUT PAIN. 15 tablet 5  . febuxostat (ULORIC) 40 MG tablet Take 40 mg by mouth daily.    Marland Kitchen gabapentin (NEURONTIN) 300 MG capsule Take 300 mg by mouth 2 (two) times daily.     Marland Kitchen HYDROcodone-acetaminophen (NORCO/VICODIN) 5-325 MG tablet One tablet every six hours as needed for acute pain.  Must last 14 days. 55 tablet 0  . levothyroxine (SYNTHROID, LEVOTHROID) 200 MCG tablet TAKE 1 TABLET DAILY BEFORE BREAKFAST. 90 tablet 3  . metoprolol  succinate (TOPROL-XL) 25 MG 24 hr tablet Take 25 mg by mouth daily.    Marland Kitchen omeprazole (PRILOSEC) 20 MG capsule Take 1 capsule (20 mg total) by mouth daily. 30 capsule 11  . tamsulosin (FLOMAX) 0.4 MG CAPS capsule Take 1 capsule (0.4 mg total) by mouth daily. 90 capsule 3  . amLODipine (NORVASC) 5 MG tablet Take 1 tablet (5 mg total) by mouth daily. 90 tablet 3   No current facility-administered medications for this visit.      Physical Exam  Blood pressure 124/79, pulse  83, height 6\' 1"  (1.854 m), weight 253 lb (114.8 kg).  Constitutional: overall normal hygiene, normal nutrition, well developed, normal grooming, normal body habitus. Assistive device:none  Musculoskeletal: gait and station Limp none, muscle tone and strength are normal, no tremors or atrophy is present.  .  Neurological: coordination overall normal.  Deep tendon reflex/nerve stretch intact.  Sensation normal.  Cranial nerves II-XII intact.   Skin:   Normal overall no scars, lesions, ulcers or rashes. No psoriasis.  Psychiatric: Alert and oriented x 3.  Recent memory intact, remote memory unclear.  Normal mood and affect. Well groomed.  Good eye contact.  Cardiovascular: overall no swelling, no varicosities, no edema bilaterally, normal temperatures of the legs and arms, no clubbing, cyanosis and good capillary refill.  Lymphatic: palpation is normal.  Left hand with some slight swelling and pain at the wrist but no increased heat. NV intact. ROM of the fingers is good.  All other systems reviewed and are negative   The patient has been educated about the nature of the problem(s) and counseled on treatment options.  The patient appeared to understand what I have discussed and is in agreement with it.  Encounter Diagnoses  Name Primary?  . Acute idiopathic gout of left hand Yes  . Chronic pain disorder     PLAN Call if any problems.  Precautions discussed.  Continue current medications.   Return to clinic 1  month   I have reviewed the [place] web site prior to prescribing narcotic medicine for this patient.  Electronically Signed Sanjuana Kava, MD 1/15/20199:18 AM

## 2017-09-22 ENCOUNTER — Encounter (HOSPITAL_COMMUNITY): Payer: Self-pay | Admitting: Internal Medicine

## 2017-09-22 ENCOUNTER — Inpatient Hospital Stay (HOSPITAL_BASED_OUTPATIENT_CLINIC_OR_DEPARTMENT_OTHER): Payer: Medicare Other | Admitting: Internal Medicine

## 2017-09-22 ENCOUNTER — Inpatient Hospital Stay (HOSPITAL_COMMUNITY): Payer: Medicare Other | Attending: Oncology

## 2017-09-22 VITALS — BP 129/82 | HR 72 | Temp 97.7°F | Resp 20 | Wt 257.2 lb

## 2017-09-22 DIAGNOSIS — C7A092 Malignant carcinoid tumor of the stomach: Secondary | ICD-10-CM | POA: Diagnosis not present

## 2017-09-22 DIAGNOSIS — Z862 Personal history of diseases of the blood and blood-forming organs and certain disorders involving the immune mechanism: Secondary | ICD-10-CM

## 2017-09-22 DIAGNOSIS — D126 Benign neoplasm of colon, unspecified: Secondary | ICD-10-CM

## 2017-09-22 DIAGNOSIS — D3A8 Other benign neuroendocrine tumors: Secondary | ICD-10-CM

## 2017-09-22 DIAGNOSIS — D3A092 Benign carcinoid tumor of the stomach: Secondary | ICD-10-CM

## 2017-09-22 DIAGNOSIS — D500 Iron deficiency anemia secondary to blood loss (chronic): Secondary | ICD-10-CM

## 2017-09-22 LAB — COMPREHENSIVE METABOLIC PANEL
ALBUMIN: 3.9 g/dL (ref 3.5–5.0)
ALT: 16 U/L — ABNORMAL LOW (ref 17–63)
AST: 27 U/L (ref 15–41)
Alkaline Phosphatase: 55 U/L (ref 38–126)
Anion gap: 10 (ref 5–15)
BILIRUBIN TOTAL: 0.9 mg/dL (ref 0.3–1.2)
BUN: 16 mg/dL (ref 6–20)
CHLORIDE: 101 mmol/L (ref 101–111)
CO2: 27 mmol/L (ref 22–32)
Calcium: 9.2 mg/dL (ref 8.9–10.3)
Creatinine, Ser: 1.2 mg/dL (ref 0.61–1.24)
GFR calc Af Amer: >60 mL/min (ref >60)
GFR calc non Af Amer: >60 mL/min (ref >60)
GLUCOSE: 105 mg/dL — AB (ref 65–99)
POTASSIUM: 4 mmol/L (ref 3.5–5.1)
SODIUM: 138 mmol/L (ref 135–145)
TOTAL PROTEIN: 8 g/dL (ref 6.5–8.1)

## 2017-09-22 LAB — CBC WITH DIFFERENTIAL/PLATELET
BASOS ABS: 0 10*3/uL (ref 0.0–0.1)
BASOS PCT: 0 %
EOS ABS: 0.2 10*3/uL (ref 0.0–0.7)
EOS PCT: 4 %
HEMATOCRIT: 40.8 % (ref 39.0–52.0)
Hemoglobin: 13.1 g/dL (ref 13.0–17.0)
Lymphocytes Relative: 46 %
Lymphs Abs: 2.7 10*3/uL (ref 0.7–4.0)
MCH: 30 pg (ref 26.0–34.0)
MCHC: 32.1 g/dL (ref 30.0–36.0)
MCV: 93.4 fL (ref 78.0–100.0)
MONO ABS: 0.5 10*3/uL (ref 0.1–1.0)
MONOS PCT: 9 %
NEUTROS ABS: 2.4 10*3/uL (ref 1.7–7.7)
Neutrophils Relative %: 41 %
PLATELETS: 155 10*3/uL (ref 150–400)
RBC: 4.37 MIL/uL (ref 4.22–5.81)
RDW: 14.4 % (ref 11.5–15.5)
WBC: 5.8 10*3/uL (ref 4.0–10.5)

## 2017-09-22 LAB — IRON AND TIBC
Iron: 97 ug/dL (ref 45–182)
SATURATION RATIOS: 28 % (ref 17.9–39.5)
TIBC: 347 ug/dL (ref 250–450)
UIBC: 250 ug/dL

## 2017-09-22 LAB — FERRITIN: Ferritin: 62 ng/mL (ref 24–336)

## 2017-09-22 NOTE — Progress Notes (Signed)
CURRENT THERAPY: Surveillance and continued oral iron replacement with ferrous forte beginning on 02/04/2016.  INTERVAL HISTORY: [name] 67 y.o. male returns for followup of well differentiated neuroendocrine carcinoid tumor on polypectomy by Dr. Oneida Alar on 08/14/2014 and 10/08/2015 with negative octreotide scan on 09/19/2014.  His case was presented at GI tumor board in February/March 2016 with recommendation for anatomic biopsies to guide role of surgical resection.  Anatomic biopsies in 03/2016 were negative. AND Iron deficiency anemia, on Ferrex forte beginning on 02/04/2016.  HPI: Low-grade neuroendocrine tumor involving stomach polyps based on EGD from 08/17/2014.   Tubular adenoma without dysplasia within the cecum tubulovillous adenoma in the descending colon. There was a biopsy of the stomach that showed focal involvement by low-grade neuroendocrine tumor. Due to concern that he may have residual disease.  A subsequent octreotide scan showed no uptake. Octreotide scan from September 19, 2014 was negative  Initial serum chromogranin levels were modestly elevated at 24 in January 2016. As discussed in the tumor board and surveillance EGDs with biopsies was recommended.  He also has a history of iron deficiency anemia started p.o. iron supplements in June 2017 with subsequent improvement. His most recent upper GI endoscopy from April 21, 2016 Nodular mucosa was noted within the stomach body fundus and cardia where metal clips were placed in the stomach in February 2017 Pathology from these biopsies showed benign gastric mucosa with chronic minimally active gastritis no H. pylori no intestinal metaplasia dysplasia or malignancy however within the stomach body fundus and cardia there was inflamed chronic active gastritis with intestinal metaplasia.  INTERVAL HISTORY: Today he denies any new complaints no abdominal pain diarrhea nausea vomiting appetite is great no change in weight remains active  is a known alcoholic denies any melena hematochezia.     Past Medical History:  Diagnosis Date  . Anemia    FeDA: GASTRIC POLYPS, B12; TCS 2008 EGD 2009, 2008-HB 11.1 MCV 83.6 CR 1.22, 2009 FERRITIN 102-22  . B12 deficiency   . Carcinoid tumor determined by biopsy of stomach 09/25/2014  . Chronic knee pain   . Diverticulosis of colon    Lower GI bleed 2008  . Essential hypertension   . GERD (gastroesophageal reflux disease)   . Gout   . Hepatomegaly    Hepatic steatosis  . History of alcohol abuse   . History of septic arthritis   . Hypothyroidism   . PUD   . Substance abuse [facility])     Past Surgical History:  Procedure Laterality Date  . BIOPSY  08/14/2014   Procedure: BIOPSY;  Surgeon: Danie Binder, MD;  [facility]: [facility];  Service: Endoscopy;;  . BIOPSY  03/12/2015   Procedure: BIOPSY;  Surgeon: Danie Binder, MD;  Location: [facility];  Service: Endoscopy;;  . BIOPSY  04/21/2016   Procedure: BIOPSY;  Surgeon: Danie Binder, MD;  [facility];  Service: Endoscopy;;  bx's of antrum, body of stomach, fundus, and cardia  . CHOLECYSTECTOMY    . COLONOSCOPY  2008 [facility] DJ   LGIB 2o to TICS, prep good  . COLONOSCOPY WITH PROPOFOL N/A 08/14/2014   SLF: 1. Four large colon polyps removed. 2. The left colon is redundant 3. Moderate diverticulosis throughout teh entire examined colon  . COLONOSCOPY WITH PROPOFOL N/A 11/06/2014   SLF: 8 small polyps removed. One large pedunculated polyp removed from the ascending colon, tubulovillous and tubular adenomas. Next colonoscopy March 2019  . ESOPHAGOGASTRODUODENOSCOPY  12/08/2007   EYC:XKGY gastric polyps  seen in the cardia and body of the stomach/Normal esophagus without evidence of Barrett's, mass, erosion/Normal duodenal bulb and second portion of the duodenum. Benign bx.  . ESOPHAGOGASTRODUODENOSCOPY (EGD) WITH PROPOFOL N/A 08/14/2014   SLF: 1. Heme postive stools due to gastric and colon polyps 2. Multiple gastric   .  ESOPHAGOGASTRODUODENOSCOPY (EGD) WITH PROPOFOL N/A 03/12/2015   SLF: Multiple gastric nodules seen in gasric body/antrum. 2. Non-erosive gastritis ( inflammation) was found in the gastric antrum.   . ESOPHAGOGASTRODUODENOSCOPY (EGD) WITH PROPOFOL N/A 10/08/2015   Procedure: ESOPHAGOGASTRODUODENOSCOPY (EGD) WITH PROPOFOL;  Surgeon: Danie Binder, MD;  [facility];  Service: Endoscopy;  Laterality: N/A;  0945  . ESOPHAGOGASTRODUODENOSCOPY (EGD) WITH PROPOFOL N/A 04/21/2016   Procedure: ESOPHAGOGASTRODUODENOSCOPY (EGD) WITH PROPOFOL;  Surgeon: Danie Binder, MD;  [facility];  Service: Endoscopy;  Laterality: N/A;  730   . HAND SURGERY    . JOINT REPLACEMENT    . POLYPECTOMY  08/14/2014   Procedure: POLYPECTOMY;  Surgeon: Danie Binder, MD;  Location: [facility];  Service: Endoscopy;;  . POLYPECTOMY N/A 11/06/2014   Procedure: POLYPECTOMY;  Surgeon: Danie Binder, MD;  Location: [facility];  Service: Endoscopy;  Laterality: N/A;  Transverse Colon x3, Ascending Colon x2, Descending Colon x3  . REPLACEMENT TOTAL KNEE BILATERAL    . UPPER GASTROINTESTINAL ENDOSCOPY  APR 2009   INFLAMED HYPERPLASTIC POLYPS, CHRONIC GASTRITIS    Family History  Problem Relation Age of Onset  . Diabetes Mother   . Renal Disease Mother        failure  . Diabetes Father        'old age'  . Hypertension Sister   . Hypertension Brother   . Diabetes Brother   . Diabetes Sister   . Diabetes Sister   . Diabetes Brother   . Hypertension Brother   . Diabetes Brother        right BKA  . Kidney failure Brother        on dialysis  . Kidney failure Brother        on dialysis  . Diabetes Brother        bilateral BKA  . Colon polyps Neg Hx   . Colon cancer Neg Hx   . Pancreatic disease Neg Hx     Social History   Socioeconomic History  . Marital status: Single    Spouse name: None  . Number of children: 0  . Years of education: 3  . Highest education level: None  Social Needs  . Financial  resource strain: None  . Food insecurity - worry: None  . Food insecurity - inability: None  . Transportation needs - medical: None  . Transportation needs - non-medical: None  Occupational History  . Occupation: retired    Comment: farming  Tobacco Use  . Smoking status: Never Smoker  . Smokeless tobacco: Never Used  . Tobacco comment: Quit x 10 years/ never smoked on regular basis  Substance and Sexual Activity  . Alcohol use: Yes    Alcohol/week: 1.2 oz    Types: 2 Shots of liquor per week    Comment: drinks on weekends, gin/vodka 1/5th.  . Drug use: No  . Sexual activity: No  Other Topics Concern  . None  Social History Narrative   HE DOES NOT HAVE ANY CHILDREN.   Lives alone   Does not drive   CAN NOT READ     PHYSICAL EXAMINATION  ECOG PERFORMANCE STATUS: 0 -  Asymptomatic  Vitals:   09/22/17 1145  BP: 129/82  Pulse: 72  Resp: 20  Temp: 97.7 F (36.5 C)  SpO2: 98%    GENERAL:Alert, no distress, well nourished, well developed, comfortable, cooperative, SKIN: skin color, texture, turgor are normal, no rashes or significant lesions HEAD: Normocephalic, No masses, lesions, tenderness or abnormalities EYES: normal, EOMI, Conjunctiva are pink and non-injected NECK: supple, no adenopathy, trachea midline LYMPH:  no palpable lymphadenopathy, no hepatosplenomegaly LUNGS: clear to auscultation and percussion HEART: regular rate & rhythm,  ABDOMEN:abdomen soft, non-tender, obese and normal bowel sounds NEURO: alert & oriented x 3   LABORATORY DATA: CBC    Component Value Date/Time   WBC 5.8 09/22/2017 1041   RBC 4.37 09/22/2017 1041   HGB 13.1 09/22/2017 1041   HCT 40.8 09/22/2017 1041   HCT 44 01/05/2013   PLT 155 09/22/2017 1041   PLT 148 01/05/2013   MCV 93.4 09/22/2017 1041   MCV 90.4 01/05/2013   MCH 30.0 09/22/2017 1041   MCHC 32.1 09/22/2017 1041   RDW 14.4 09/22/2017 1041   LYMPHSABS 2.7 09/22/2017 1041   MONOABS 0.5 09/22/2017 1041   EOSABS  0.2 09/22/2017 1041   BASOSABS 0.0 09/22/2017 1041      Chemistry      Component Value Date/Time   NA 138 09/22/2017 1041   K 4.0 09/22/2017 1041   CL 101 09/22/2017 1041   CO2 27 09/22/2017 1041   BUN 16 09/22/2017 1041   CREATININE 1.20 09/22/2017 1041   CREATININE 1.28 (H) 01/20/2017 0926      Component Value Date/Time   CALCIUM 9.2 09/22/2017 1041   ALKPHOS 55 09/22/2017 1041   AST 27 09/22/2017 1041   ALT 16 (L) 09/22/2017 1041   BILITOT 0.9 09/22/2017 1041     Results for BRAILEN, MACNEAL (MRN [id number]) as of 03/22/2017 10:46  Ref. Range 11/18/2016 09:26  Chromogranin A Latest Ref Range: 0 - 5 nmol/L 3    Results for DESHANNON, HINCHLIFFE (MRN [id number]) as of 03/22/2017 10:46  Ref. Range 11/18/2016 09:26  Serotonin, Serum Latest Ref Range: 21 - 321 ng/mL 95   Lab Results  Component Value Date   IRON 97 09/22/2017   TIBC 347 09/22/2017   FERRITIN 62 09/22/2017      ASSESSMENT AND PLAN:  CBC shows normal hemoglobin with no evidence of iron deficiency.   Serum chromogranin level and serotonin level have been ordered today we will contact him with results  Otherwise continue observation and keep his six-month follow-up for surveillance EGDs and colonoscopies by Dr. Trinda Pascal.  This note is electronically signed by: Creola Corn, MD  09/22/2017 6:19 AM

## 2017-09-22 NOTE — Patient Instructions (Signed)
[facility] at [facility] Discharge Instructions  RECOMMENDATIONS MADE BY THE CONSULTANT AND ANY TEST RESULTS WILL BE SENT TO YOUR REFERRING PHYSICIAN.  Seen by Dr. Perumandla today. Follow-up as scheduled  Thank you for choosing [facility] at [facility] to provide your oncology and hematology care.  To afford each patient quality time with our provider, please arrive at least 15 minutes before your scheduled appointment time.    If you have a lab appointment with the Cancer Center please come in thru the  Main Entrance and check in at the main information desk  You need to re-schedule your appointment should you arrive 10 or more minutes late.  We strive to give you quality time with our providers, and arriving late affects you and other patients whose appointments are after yours.  Also, if you no show three or more times for appointments you may be dismissed from the clinic at the providers discretion.     Again, thank you for choosing [facility].  Our hope is that these requests will decrease the amount of time that you wait before being seen by our physicians.       _____________________________________________________________  Should you have questions after your visit to [facility], please contact our office at [phone] between the hours of 8:30 a.m. and 4:30 p.m.  Voicemails left after 4:30 p.m. will not be returned until the following business day.  For prescription refill requests, have your pharmacy contact our office.       Resources For Cancer Patients and their Caregivers ? American Cancer Society: Can assist with transportation, wigs, general needs, runs Look Good Feel Better.        1-888-227-6333 ? Cancer Care: Provides financial assistance, online support groups, medication/co-pay assistance.  1-800-813-HOPE (4673) ? [facility] Assists [place] cancer patients  and their families through emotional , educational and financial support.  336-427-4357 ? [place] DSS Where to apply for food stamps, Medicaid and utility assistance. 336-342-1394 ? [facility]: Transportation to medical appointments. 336-347-2287 ? Social Security Administration: May apply for disability if have a Stage IV cancer. 336-342-7796 1-800-772-1213 ? [place] Aging, Disability and Transit Services: Assists with nutrition, care and transit needs. 336-349-2343  Cancer Center Support Programs: @10RELATIVEDAYS@ > Cancer Support Group  2nd Tuesday of the month 1pm-2pm, Journey Room  > Creative Journey  3rd Tuesday of the month 1130am-1pm, Journey Room  > Look Good Feel Better  1st Wednesday of the month 10am-12 noon, Journey Room (Call American Cancer Society to register 1-800-395-5775)   

## 2017-09-23 ENCOUNTER — Encounter: Payer: Self-pay | Admitting: Gastroenterology

## 2017-09-24 LAB — CHROMOGRANIN A: CHROMOGRANIN A: 4 nmol/L (ref 0–5)

## 2017-09-24 LAB — SEROTONIN SERUM: Serotonin, Serum: 121 ng/mL (ref 21–321)

## 2017-10-12 ENCOUNTER — Encounter: Payer: Self-pay | Admitting: Orthopaedic Surgery

## 2017-10-12 ENCOUNTER — Ambulatory Visit (INDEPENDENT_AMBULATORY_CARE_PROVIDER_SITE_OTHER): Payer: Medicare Other | Admitting: Orthopaedic Surgery

## 2017-10-12 VITALS — BP 136/90 | HR 90 | Temp 97.4°F | Ht 73.0 in | Wt 250.0 lb

## 2017-10-12 DIAGNOSIS — G894 Chronic pain syndrome: Secondary | ICD-10-CM

## 2017-10-12 DIAGNOSIS — M10042 Idiopathic gout, left hand: Secondary | ICD-10-CM | POA: Diagnosis not present

## 2017-10-12 MED ORDER — HYDROCODONE-ACETAMINOPHEN 5-325 MG PO TABS: ORAL_TABLET | ORAL | 0 refills | Status: DC

## 2017-10-12 NOTE — Progress Notes (Signed)
Patient GN:FAOZH Coker, male DOB:06/22/1951, 67 y.o. YQM:[id number]  Chief Complaint  Patient presents with  . Gout    HPI  [name] is a 67 y.o. male who has gout. He has had more swelling of the left hand and wrist recently.  He is taking his gout medicine.  He has no trauma.  He is taking pain medicine. HPI  Body mass index is 32.98 kg/m.  ROS  Review of Systems  HENT: Negative for congestion.   Respiratory: Negative for cough and shortness of breath.   Cardiovascular: Negative for chest pain and leg swelling.  Endocrine: Positive for cold intolerance.  Musculoskeletal: Positive for arthralgias and joint swelling.  Allergic/Immunologic: Positive for environmental allergies.  All other systems reviewed and are negative.   Past Medical History:  Diagnosis Date  . Anemia    FeDA: GASTRIC POLYPS, B12; TCS 2008 EGD 2009, 2008-HB 11.1 MCV 83.6 CR 1.22, 2009 FERRITIN 102-22  . B12 deficiency   . Carcinoid tumor determined by biopsy of stomach 09/25/2014  . Chronic knee pain   . Diverticulosis of colon    Lower GI bleed 2008  . Essential hypertension   . GERD (gastroesophageal reflux disease)   . Gout   . Hepatomegaly    Hepatic steatosis  . History of alcohol abuse   . History of septic arthritis   . Hypothyroidism   . PUD   . Substance abuse [facility])     Past Surgical History:  Procedure Laterality Date  . BIOPSY  08/14/2014   Procedure: BIOPSY;  Surgeon: Danie Binder, MD;  Location: [facility];  Service: Endoscopy;;  . BIOPSY  03/12/2015   Procedure: BIOPSY;  Surgeon: Danie Binder, MD;  Location: [facility];  Service: Endoscopy;;  . BIOPSY  04/21/2016   Procedure: BIOPSY;  Surgeon: Danie Binder, MD;  [facility];  Service: Endoscopy;;  bx's of antrum, body of stomach, fundus, and cardia  . CHOLECYSTECTOMY    . COLONOSCOPY  2008 [facility] DJ   LGIB 2o to TICS, prep good  . COLONOSCOPY WITH PROPOFOL N/A 08/14/2014   SLF: 1. Four large colon polyps removed. 2.  The left colon is redundant 3. Moderate diverticulosis throughout teh entire examined colon  . COLONOSCOPY WITH PROPOFOL N/A 11/06/2014   SLF: 8 small polyps removed. One large pedunculated polyp removed from the ascending colon, tubulovillous and tubular adenomas. Next colonoscopy March 2019  . ESOPHAGOGASTRODUODENOSCOPY  12/08/2007   BMW:UXLK gastric polyps seen in the cardia and body of the stomach/Normal esophagus without evidence of Barrett's, mass, erosion/Normal duodenal bulb and second portion of the duodenum. Benign bx.  . ESOPHAGOGASTRODUODENOSCOPY (EGD) WITH PROPOFOL N/A 08/14/2014   SLF: 1. Heme postive stools due to gastric and colon polyps 2. Multiple gastric   . ESOPHAGOGASTRODUODENOSCOPY (EGD) WITH PROPOFOL N/A 03/12/2015   SLF: Multiple gastric nodules seen in gasric body/antrum. 2. Non-erosive gastritis ( inflammation) was found in the gastric antrum.   . ESOPHAGOGASTRODUODENOSCOPY (EGD) WITH PROPOFOL N/A 10/08/2015   Procedure: ESOPHAGOGASTRODUODENOSCOPY (EGD) WITH PROPOFOL;  Surgeon: Danie Binder, MD;  [facility];  Service: Endoscopy;  Laterality: N/A;  0945  . ESOPHAGOGASTRODUODENOSCOPY (EGD) WITH PROPOFOL N/A 04/21/2016   Procedure: ESOPHAGOGASTRODUODENOSCOPY (EGD) WITH PROPOFOL;  Surgeon: Danie Binder, MD;  [facility];  Service: Endoscopy;  Laterality: N/A;  730   . HAND SURGERY    . JOINT REPLACEMENT    . POLYPECTOMY  08/14/2014   Procedure: POLYPECTOMY;  Surgeon: Marga Melnick  Fields, MD;  Location: [facility];  Service: Endoscopy;;  . POLYPECTOMY N/A 11/06/2014   Procedure: POLYPECTOMY;  Surgeon: Danie Binder, MD;  Location: [facility];  Service: Endoscopy;  Laterality: N/A;  Transverse Colon x3, Ascending Colon x2, Descending Colon x3  . REPLACEMENT TOTAL KNEE BILATERAL    . UPPER GASTROINTESTINAL ENDOSCOPY  APR 2009   INFLAMED HYPERPLASTIC POLYPS, CHRONIC GASTRITIS    Family History  Problem Relation Age of Onset  . Diabetes Mother   . Renal Disease  Mother        failure  . Diabetes Father        'old age'  . Hypertension Sister   . Hypertension Brother   . Diabetes Brother   . Diabetes Sister   . Diabetes Sister   . Diabetes Brother   . Hypertension Brother   . Diabetes Brother        right BKA  . Kidney failure Brother        on dialysis  . Kidney failure Brother        on dialysis  . Diabetes Brother        bilateral BKA  . Colon polyps Neg Hx   . Colon cancer Neg Hx   . Pancreatic disease Neg Hx     Social History Social History   Tobacco Use  . Smoking status: Never Smoker  . Smokeless tobacco: Never Used  . Tobacco comment: Quit x 10 years/ never smoked on regular basis  Substance Use Topics  . Alcohol use: Yes    Alcohol/week: 1.2 oz    Types: 2 Shots of liquor per week    Comment: drinks on weekends, gin/vodka 1/5th.  . Drug use: No    Allergies  Allergen Reactions  . Aspirin Other (See Comments)    REACTION: Diverticular Bleed    Current Outpatient Medications  Medication Sig Dispense Refill  . acetaminophen (TYLENOL) 500 MG tablet Take 500 mg by mouth every 6 (six) hours as needed for moderate pain.    Marland Kitchen amLODipine (NORVASC) 5 MG tablet     . atorvastatin (LIPITOR) 20 MG tablet Take 1 tablet (20 mg total) by mouth daily. 90 tablet 3  . clindamycin (CLINDAGEL) 1 % gel Apply topically 2 (two) times daily. 30 g 11  . colchicine 0.6 MG tablet TAKE 1 TABLET BY MOUTH 3 TIMES DAILY X5 DAYS FOR GOUT PAIN. 15 tablet 5  . febuxostat (ULORIC) 40 MG tablet Take 40 mg by mouth daily.    Marland Kitchen gabapentin (NEURONTIN) 300 MG capsule Take 300 mg by mouth 2 (two) times daily.     Marland Kitchen HYDROcodone-acetaminophen (NORCO/VICODIN) 5-325 MG tablet One tablet every six hours as needed for acute pain.  Must last 14 days. 55 tablet 0  . iron polysaccharides (NIFEREX) 150 MG capsule Take 150 mg by mouth daily.    Marland Kitchen levothyroxine (SYNTHROID, LEVOTHROID) 200 MCG tablet TAKE 1 TABLET DAILY BEFORE BREAKFAST. 90 tablet 3  . metoprolol  succinate (TOPROL-XL) 25 MG 24 hr tablet Take 25 mg by mouth daily.    Marland Kitchen omeprazole (PRILOSEC) 20 MG capsule Take 1 capsule (20 mg total) by mouth daily. 30 capsule 11  . tamsulosin (FLOMAX) 0.4 MG CAPS capsule Take 1 capsule (0.4 mg total) by mouth daily. 90 capsule 3  . amLODipine (NORVASC) 5 MG tablet Take 1 tablet (5 mg total) by mouth daily. 90 tablet 3   No current facility-administered medications for this visit.      Physical Exam  Blood pressure 136/90, pulse 90, temperature (!) 97.4 F (36.3 C), height 6\' 1"  (1.854 m), weight 250 lb (113.4 kg).  Constitutional: overall normal hygiene, normal nutrition, well developed, normal grooming, normal body habitus. Assistive device:none  Musculoskeletal: gait and station Limp none, muscle tone and strength are normal, no tremors or atrophy is present.  .  Neurological: coordination overall normal.  Deep tendon reflex/nerve stretch intact.  Sensation normal.  Cranial nerves II-XII intact.   Skin:   Normal overall no scars, lesions, ulcers or rashes. No psoriasis.  Psychiatric: Alert and oriented x 3.  Recent memory intact, remote memory unclear.  Normal mood and affect. Well groomed.  Good eye contact.  Cardiovascular: overall no swelling, no varicosities, no edema bilaterally, normal temperatures of the legs and arms, no clubbing, cyanosis and good capillary refill.  Lymphatic: palpation is normal.  The left hand and wrist have slight swelling but full ROM. He has no redness, no increased warmth.  NV intact.  All other systems reviewed and are negative   The patient has been educated about the nature of the problem(s) and counseled on treatment options.  The patient appeared to understand what I have discussed and is in agreement with it.  Encounter Diagnoses  Name Primary?  . Acute idiopathic gout of left hand Yes  . Chronic pain disorder     PLAN Call if any problems.  Precautions discussed.  Continue current medications.    Return to clinic 3 months   I have reviewed the [facility] web site prior to prescribing narcotic medicine for this patient.  Electronically Signed Sanjuana Kava, MD 2/12/20199:44 AM

## 2017-10-16 ENCOUNTER — Emergency Department (HOSPITAL_COMMUNITY)
Admission: EM | Admit: 2017-10-16 | Discharge: 2017-10-16 | Disposition: A | Discharge status: Home / Self Care | Payer: Medicare Other | Attending: Emergency Medicine | Admitting: Emergency Medicine

## 2017-10-16 ENCOUNTER — Other Ambulatory Visit: Payer: Self-pay

## 2017-10-16 ENCOUNTER — Encounter (HOSPITAL_COMMUNITY): Payer: Self-pay | Admitting: Emergency Medicine

## 2017-10-16 DIAGNOSIS — E039 Hypothyroidism, unspecified: Secondary | ICD-10-CM | POA: Diagnosis not present

## 2017-10-16 DIAGNOSIS — Z96653 Presence of artificial knee joint, bilateral: Secondary | ICD-10-CM | POA: Insufficient documentation

## 2017-10-16 DIAGNOSIS — N5082 Scrotal pain: Secondary | ICD-10-CM | POA: Diagnosis present

## 2017-10-16 DIAGNOSIS — I10 Essential (primary) hypertension: Secondary | ICD-10-CM | POA: Diagnosis not present

## 2017-10-16 DIAGNOSIS — N501 Vascular disorders of male genital organs: Secondary | ICD-10-CM | POA: Insufficient documentation

## 2017-10-16 DIAGNOSIS — Z79899 Other long term (current) drug therapy: Secondary | ICD-10-CM | POA: Insufficient documentation

## 2017-10-16 MED ORDER — SILVER NITRATE-POT NITRATE 75-25 % EX MISC
CUTANEOUS | Status: AC
Start: 2017-10-16 — End: 2017-10-16
  Administered 2017-10-16: 18:00:00
  Filled 2017-10-16: qty 1

## 2017-10-16 NOTE — ED Provider Notes (Signed)
[facility] EMERGENCY DEPARTMENT Provider Note   CSN: [id number] Arrival date & time: 10/16/17  1529     History   Chief Complaint Chief Complaint  Patient presents with  . scrotal bleeding    HPI [name] is a 67 y.o. male.  HPI Patient presents with bleeding from scrotum.  Began earlier today while he was taking a bath.  States there is a fair amount of bleeding.  No trauma.  No lightheadedness or dizziness.  States the bleeding has pretty much stopped now.  No previous history of bleeding at the spot.  Does not bruise easily. Past Medical History:  Diagnosis Date  . Anemia    FeDA: GASTRIC POLYPS, B12; TCS 2008 EGD 2009, 2008-HB 11.1 MCV 83.6 CR 1.22, 2009 FERRITIN 102-22  . B12 deficiency   . Carcinoid tumor determined by biopsy of stomach 09/25/2014  . Chronic knee pain   . Diverticulosis of colon    Lower GI bleed 2008  . Essential hypertension   . GERD (gastroesophageal reflux disease)   . Gout   . Hepatomegaly    Hepatic steatosis  . History of alcohol abuse   . History of septic arthritis   . Hypothyroidism   . PUD   . Substance abuse [facility])     Patient Active Problem List   Diagnosis Date Noted  . Hyperlipidemia 10/23/2016  . Mild intellectual disabilities (CODE) 09/29/2016  . Personal history of gout 09/22/2016  . Literacy level of illiterate 09/22/2016  . History of bilateral knee replacement 09/22/2016  . Carcinoid tumor determined by biopsy of stomach 04/10/2016  . Tubulovillous adenoma of large intestine 09/12/2015  . Neuroendocrine tumor 09/25/2014  . Elevated LFTs 07/17/2014  . Iron deficiency anemia 07/29/2010  . Fatty liver 07/29/2010  . ABUSE, ALCOHOL, UNSPECIFIED 11/30/2008  . Osteoarthrosis involving lower leg 08/20/2008  . Essential hypertension 10/20/2006  . B12 deficiency 08/15/2006  . GERD 08/15/2006  . IBS 08/15/2006  . BPH without obstruction/lower urinary tract symptoms 08/15/2006  . OSTEOARTHRITIS 08/15/2006  .  DEGENERATION, DISC NOS 08/15/2006    Past Surgical History:  Procedure Laterality Date  . BIOPSY  08/14/2014   Procedure: BIOPSY;  Surgeon: Danie Binder, MD;  Location: [facility];  Service: Endoscopy;;  . BIOPSY  03/12/2015   Procedure: BIOPSY;  Surgeon: Danie Binder, MD;  Location: [facility];  Service: Endoscopy;;  . BIOPSY  04/21/2016   Procedure: BIOPSY;  Surgeon: Danie Binder, MD;  [facility];  Service: Endoscopy;;  bx's of antrum, body of stomach, fundus, and cardia  . CHOLECYSTECTOMY    . COLONOSCOPY  2008 [facility] DJ   LGIB 2o to TICS, prep good  . COLONOSCOPY WITH PROPOFOL N/A 08/14/2014   SLF: 1. Four large colon polyps removed. 2. The left colon is redundant 3. Moderate diverticulosis throughout teh entire examined colon  . COLONOSCOPY WITH PROPOFOL N/A 11/06/2014   SLF: 8 small polyps removed. One large pedunculated polyp removed from the ascending colon, tubulovillous and tubular adenomas. Next colonoscopy March 2019  . ESOPHAGOGASTRODUODENOSCOPY  12/08/2007   MOQ:HUTM gastric polyps seen in the cardia and body of the stomach/Normal esophagus without evidence of Barrett's, mass, erosion/Normal duodenal bulb and second portion of the duodenum. Benign bx.  . ESOPHAGOGASTRODUODENOSCOPY (EGD) WITH PROPOFOL N/A 08/14/2014   SLF: 1. Heme postive stools due to gastric and colon polyps 2. Multiple gastric   . ESOPHAGOGASTRODUODENOSCOPY (EGD) WITH PROPOFOL N/A 03/12/2015   SLF: Multiple gastric nodules seen in gasric  body/antrum. 2. Non-erosive gastritis ( inflammation) was found in the gastric antrum.   . ESOPHAGOGASTRODUODENOSCOPY (EGD) WITH PROPOFOL N/A 10/08/2015   Procedure: ESOPHAGOGASTRODUODENOSCOPY (EGD) WITH PROPOFOL;  Surgeon: Danie Binder, MD;  [facility];  Service: Endoscopy;  Laterality: N/A;  0945  . ESOPHAGOGASTRODUODENOSCOPY (EGD) WITH PROPOFOL N/A 04/21/2016   Procedure: ESOPHAGOGASTRODUODENOSCOPY (EGD) WITH PROPOFOL;  Surgeon: Danie Binder, MD;   [facility];  Service: Endoscopy;  Laterality: N/A;  730   . HAND SURGERY    . JOINT REPLACEMENT    . POLYPECTOMY  08/14/2014   Procedure: POLYPECTOMY;  Surgeon: Danie Binder, MD;  Location: [facility];  Service: Endoscopy;;  . POLYPECTOMY N/A 11/06/2014   Procedure: POLYPECTOMY;  Surgeon: Danie Binder, MD;  Location: [facility];  Service: Endoscopy;  Laterality: N/A;  Transverse Colon x3, Ascending Colon x2, Descending Colon x3  . REPLACEMENT TOTAL KNEE BILATERAL    . UPPER GASTROINTESTINAL ENDOSCOPY  APR 2009   INFLAMED HYPERPLASTIC POLYPS, CHRONIC GASTRITIS       Home Medications    Prior to Admission medications   Medication Sig Start Date End Date Taking? Authorizing Provider  amLODipine (NORVASC) 5 MG tablet  09/13/17  Yes [provider]  atorvastatin (LIPITOR) 20 MG tablet Take 1 tablet (20 mg total) by mouth daily. 10/23/16  Yes Raylene Everts, MD  clindamycin (CLINDAGEL) 1 % gel Apply topically 2 (two) times daily. Patient taking differently: Apply 1 application topically daily as needed (to area(s) on the back of neck).  07/20/17  Yes Raylene Everts, MD  colchicine 0.6 MG tablet TAKE 1 TABLET BY MOUTH 3 TIMES DAILY X5 DAYS FOR GOUT PAIN. Patient taking differently: Take 0.6 mg by mouth 3 (three) times daily.  07/20/17  Yes Sanjuana Kava, MD  febuxostat (ULORIC) 40 MG tablet Take 40 mg by mouth daily.   Yes [provider]  gabapentin (NEURONTIN) 300 MG capsule Take 300 mg by mouth 2 (two) times daily.  09/22/16  Yes [provider]  HYDROcodone-acetaminophen (NORCO/VICODIN) 5-325 MG tablet One tablet every six hours as needed for acute pain.  Must last 14 days. 10/12/17  Yes Sanjuana Kava, MD  iron polysaccharides (NIFEREX) 150 MG capsule Take 150 mg by mouth daily.   Yes [provider]  levothyroxine (SYNTHROID, LEVOTHROID) 200 MCG tablet TAKE 1 TABLET DAILY BEFORE BREAKFAST. 06/08/17  Yes Fayrene Helper, MD  metoprolol  succinate (TOPROL-XL) 25 MG 24 hr tablet Take 25 mg by mouth daily.   Yes [provider]  omeprazole (PRILOSEC) 20 MG capsule Take 1 capsule (20 mg total) by mouth daily. 09/30/16  Yes Fields, Sandi L, MD  tamsulosin (FLOMAX) 0.4 MG CAPS capsule Take 1 capsule (0.4 mg total) by mouth daily. 10/13/16  Yes Raylene Everts, MD    Family History Family History  Problem Relation Age of Onset  . Diabetes Mother   . Renal Disease Mother        failure  . Diabetes Father        'old age'  . Hypertension Sister   . Hypertension Brother   . Diabetes Brother   . Diabetes Sister   . Diabetes Sister   . Diabetes Brother   . Hypertension Brother   . Diabetes Brother        right BKA  . Kidney failure Brother        on dialysis  . Kidney failure Brother  on dialysis  . Diabetes Brother        bilateral BKA  . Colon polyps Neg Hx   . Colon cancer Neg Hx   . Pancreatic disease Neg Hx     Social History Social History   Tobacco Use  . Smoking status: Never Smoker  . Smokeless tobacco: Never Used  . Tobacco comment: Quit x 10 years/ never smoked on regular basis  Substance Use Topics  . Alcohol use: Yes    Alcohol/week: 1.2 oz    Types: 2 Shots of liquor per week    Comment: drinks on weekends, gin/vodka 1/5th.  . Drug use: No     Allergies   Aspirin   Review of Systems Review of Systems  Constitutional: Negative for appetite change.  Genitourinary: Negative for difficulty urinating, penile swelling, scrotal swelling and testicular pain.  Musculoskeletal: Negative for back pain.  Neurological: Negative for weakness and light-headedness.  Hematological: Does not bruise/bleed easily.     Physical Exam Updated Vital Signs BP (!) 164/105 (BP Location: Right Arm)   Pulse (!) 101   Temp 98.5 F (36.9 C) (Oral)   Resp 16   Ht 6\' 1"  (1.854 m)   Wt 113.4 kg (250 lb)   SpO2 97%   BMI 32.98 kg/m   Physical Exam  Constitutional: He appears  well-developed.  Cardiovascular: Normal rate.  Pulmonary/Chest: Effort normal.  Genitourinary:  Genitourinary Comments: Single small punctate area of bleeding on the inferior/posterior scrotum.  No surrounding erythema.  No induration.  No testicular mass or pain.  Neurological: He is alert.  Skin: Skin is warm.     ED Treatments / Results  Labs (all labs ordered are listed, but only abnormal results are displayed) Labs Reviewed - No data to display  EKG  EKG Interpretation None       Radiology No results found.  Procedures Procedures (including critical care time)  Medications Ordered in ED Medications  silver nitrate applicators 35-59 % applicator (  Given 7/41/63 1736)     Initial Impression / Assessment and Plan / ED Course  I have reviewed the triage vital signs and the nursing notes.  Pertinent labs & imaging results that were available during my care of the patient were reviewed by me and considered in my medical decision making (see chart for details).     Single small area of bleeding on scrotum.  Controlled with silver nitrate application.  Discharge home.  Final Clinical Impressions(s) / ED Diagnoses   Final diagnoses:  Scrotal bleeding    ED Discharge Orders    None       Davonna Belling, MD 10/16/17 1851

## 2017-10-16 NOTE — Discharge Instructions (Signed)
Watch for further bleeding. Hold pressure if the bleeding returns and come back to the Emergency room if it will not stop.

## 2017-10-16 NOTE — ED Triage Notes (Signed)
Pt reports he was bathing today and noticed some bleeding from his scrotum, denies penile or rectal bleeding. Denies any skin tears or sores. No GU sx. Denies bleeding now.

## 2017-11-01 ENCOUNTER — Ambulatory Visit: Payer: Medicare Other | Admitting: Family Medicine

## 2017-11-02 ENCOUNTER — Other Ambulatory Visit: Payer: Self-pay | Admitting: Orthopedic Surgery

## 2017-11-02 ENCOUNTER — Telehealth: Payer: Self-pay | Admitting: Orthopaedic Surgery

## 2017-11-02 MED ORDER — HYDROCODONE-ACETAMINOPHEN 5-325 MG PO TABS: ORAL_TABLET | ORAL | 0 refills | Status: DC

## 2017-11-02 NOTE — Telephone Encounter (Signed)
Hydrocodone-Acetaminophen  5/325 mg  Qty 55 Tablets  One tablet every six hours as needed for acute pain. Must last 14 days.   PATIENT USES  [facility]

## 2017-11-03 ENCOUNTER — Other Ambulatory Visit: Payer: Self-pay | Admitting: Orthopaedic Surgery

## 2017-11-03 ENCOUNTER — Ambulatory Visit (INDEPENDENT_AMBULATORY_CARE_PROVIDER_SITE_OTHER): Payer: Medicare Other | Admitting: Gastroenterology

## 2017-11-03 ENCOUNTER — Encounter: Payer: Self-pay | Admitting: Gastroenterology

## 2017-11-03 DIAGNOSIS — K760 Fatty (change of) liver, not elsewhere classified: Secondary | ICD-10-CM

## 2017-11-03 DIAGNOSIS — D3A092 Benign carcinoid tumor of the stomach: Secondary | ICD-10-CM | POA: Diagnosis not present

## 2017-11-03 DIAGNOSIS — K219 Gastro-esophageal reflux disease without esophagitis: Secondary | ICD-10-CM

## 2017-11-03 DIAGNOSIS — D126 Benign neoplasm of colon, unspecified: Secondary | ICD-10-CM

## 2017-11-03 NOTE — Progress Notes (Signed)
ON RECALL  °

## 2017-11-03 NOTE — Patient Instructions (Addendum)
DRINK WATER TO KEEP YOUR URINE LIGHT YELLOW.  FOLLOW A HIGH FIBER DIET. AVOID ITEMS THAT CAUSE BLOATING & GAS.  CONTINUE OMEPRAZOLE.  TAKE 30 MINUTES PRIOR TO YOUR FIRST MEAL.    YOU CURRENTLY DO NOT NEED ANOTHER UPPER ENDOSCOPY.  SPEAK WITH DR. Meda Coffee AND I AM SURE SHE WILL AGREE WITH ME. YOU SHOULD COMPLETE COLONOSCOPY IN MAR 2019 BUT AT THE LATEST BY THE END OF 2019 BECAUSE YOU HAD AN ADVANCED POLYP REMOVED IN [facility] 2016.   FOLLOW UP IN 1 YEAR.

## 2017-11-03 NOTE — Progress Notes (Signed)
cc'ed to pcp °

## 2017-11-03 NOTE — Assessment & Plan Note (Addendum)
BMI 32. HFP NL JAN 2019.  CONTINUE TO MONITOR SYMPTOMS. PT HAPPY WITH HIS WEIGHT. FOLLOW UP IN 1 YEAR.

## 2017-11-03 NOTE — Progress Notes (Signed)
Subjective:    Patient ID: [name], male    DOB: 04-Aug-1951, 67 y.o.   MRN: [id number]  Raylene Everts, MD   HPI No questions or concerns. BMs: regular. APPETITE: GOOD. WEIGHT: 247 LBS TODAY LAS JAN WAS 251 LBS.   PT DENIES FEVER, CHILLS, HEMATOCHEZIA, nausea, vomiting, melena, diarrhea, CHEST PAIN, SHORTNESS OF BREATH,  CHANGE IN BOWEL IN HABITS, constipation, abdominal pain, problems with sedation, OR heartburn or indigestion.   Past Medical History:  Diagnosis Date  . Anemia    FeDA: GASTRIC POLYPS, B12; TCS 2008 EGD 2009, 2008-HB 11.1 MCV 83.6 CR 1.22, 2009 FERRITIN 102-22  . B12 deficiency   . Carcinoid tumor determined by biopsy of stomach 09/25/2014  . Chronic knee pain   . Diverticulosis of colon    Lower GI bleed 2008  . Essential hypertension   . GERD (gastroesophageal reflux disease)   . Gout   . Hepatomegaly    Hepatic steatosis  . History of alcohol abuse   . History of septic arthritis   . Hypothyroidism   . PUD   . Substance abuse [facility])     Past Surgical History:  Procedure Laterality Date  . BIOPSY  08/14/2014   Procedure: BIOPSY;  Surgeon: Danie Binder, MD;  Location: [facility];  Service: Endoscopy;;  . BIOPSY  03/12/2015   Procedure: BIOPSY;  Surgeon: Danie Binder, MD;  Location: [facility];  Service: Endoscopy;;  . BIOPSY  04/21/2016   Procedure: BIOPSY;  Surgeon: Danie Binder, MD;  Location: [facility];  Service: Endoscopy;;  bx's of antrum, body of stomach, fundus, and cardia  . CHOLECYSTECTOMY    . COLONOSCOPY  2008 [facility] DJ   LGIB 2o to TICS, prep good  . COLONOSCOPY WITH PROPOFOL N/A 08/14/2014   SL[facility]: 1. Four large colon polyps removed. 2. The left colon is redundant 3. Moderate diverticulosis throughout teh entire examined colon  . COLONOSCOPY WITH PROPOFOL N/A 11/06/2014   SLF: 8 small polyps removed. One large pedunculated polyp removed from the ascending colon, tubulovillous and tubular adenomas. Next colonoscopy March 2019  .  ESOPHAGOGASTRODUODENOSCOPY  12/08/2007   LKJ:ZPHX gastric polyps seen in the cardia and body of the stomach/Normal esophagus without evidence of Barrett's, mass, erosion/Normal duodenal bulb and second portion of the duodenum. Benign bx.  . ESOPHAGOGASTRODUODENOSCOPY (EGD) WITH PROPOFOL N/A 08/14/2014   SLF: 1. Heme postive stools due to gastric and colon polyps 2. Multiple gastric   . ESOPHAGOGASTRODUODENOSCOPY (EGD) WITH PROPOFOL N/A 03/12/2015   SLF: Multiple gastric nodules seen in gasric body/antrum. 2. Non-erosive gastritis ( inflammation) was found in the gastric antrum.   . ESOPHAGOGASTRODUODENOSCOPY (EGD) WITH PROPOFOL N/A 10/08/2015   Procedure: ESOPHAGOGASTRODUODENOSCOPY (EGD) WITH PROPOFOL;  Surgeon: Danie Binder, MD;  [facility];  Service: Endoscopy;  Laterality: N/A;  0945  . ESOPHAGOGASTRODUODENOSCOPY (EGD) WITH PROPOFOL N/A 04/21/2016   Procedure: ESOPHAGOGASTRODUODENOSCOPY (EGD) WITH PROPOFOL;  Surgeon: Danie Binder, MD;  Location: [facility];  Service: Endoscopy;  Laterality: N/A;  730   . HAND SURGERY    . JOINT REPLACEMENT    . POLYPECTOMY  08/14/2014   Procedure: POLYPECTOMY;  Surgeon: Danie Binder, MD;  Location: [facility];  Service: Endoscopy;;  . POLYPECTOMY N/A 11/06/2014   Procedure: POLYPECTOMY;  Surgeon: Danie Binder, MD;  Location: [facility];  Service: Endoscopy;  Laterality: N/A;  Transverse Colon x3, Ascending Colon x2, Descending Colon x3  . REPLACEMENT TOTAL KNEE BILATERAL    .  UPPER GASTROINTESTINAL ENDOSCOPY  APR 2009   INFLAMED HYPERPLASTIC POLYPS, CHRONIC GASTRITIS    Allergies  Allergen Reactions  . Aspirin Other (See Comments)    REACTION: Diverticular Bleed    Current Outpatient Medications  Medication Sig Dispense Refill  . amLODipine (NORVASC) 5 MG tablet     . atorvastatin (LIPITOR) 20 MG tablet Take 1 tablet (20 mg total) by mouth daily.    . clindamycin (CLINDAGEL) 1 % gel Apply topically 2 (two) times daily. (Patient taking  differently: Apply 1 application topically daily as needed (to area(s) on the back of neck). )    . colchicine 0.6 MG tablet TAKE 1 TABLET BY MOUTH 3 TIMES DAILY X5 DAYS FOR GOUT PAIN. (Patient taking differently: Take 0.6 mg by mouth 3 (three) times daily. )    . febuxostat (ULORIC) 40 MG tablet Take 40 mg by mouth daily.    Marland Kitchen gabapentin (NEURONTIN) 300 MG capsule Take 300 mg by mouth 2 (two) times daily.     Marland Kitchen HYDROcodone-acetaminophen (NORCO/VICODIN) 5-325 MG tablet One tablet every six hours as needed for acute pain.  Must last 14 days.    . iron polysaccharides (NIFEREX) 150 MG capsule Take 150 mg by mouth daily.    Marland Kitchen levothyroxine (SYNTHROID, LEVOTHROID) 200 MCG tablet TAKE 1 TABLET DAILY BEFORE BREAKFAST.    . metoprolol succinate (TOPROL-XL) 25 MG 24 hr tablet Take 25 mg by mouth daily.    Marland Kitchen omeprazole (PRILOSEC) 20 MG capsule Take 1 capsule (20 mg total) by mouth daily.    . tamsulosin (FLOMAX) 0.4 MG CAPS capsule Take 1 capsule (0.4 mg total) by mouth daily.     Review of Systems PER HPI OTHERWISE ALL SYSTEMS ARE NEGATIVE.    Objective:   Physical Exam  Constitutional: He is oriented to person, place, and time. He appears well-developed and well-nourished. No distress.  HENT:  Head: Normocephalic and atraumatic.  Mouth/Throat: Oropharynx is clear and moist. No oropharyngeal exudate.  Eyes: Pupils are equal, round, and reactive to light. No scleral icterus.  Neck: Normal range of motion. Neck supple.  Cardiovascular: Normal rate, regular rhythm and normal heart sounds.  Pulmonary/Chest: Effort normal and breath sounds normal. No respiratory distress.  Abdominal: Soft. Bowel sounds are normal. He exhibits no distension. There is no tenderness.  Musculoskeletal: He exhibits no edema. Deformity: HANDS AND WRISTS.  Lymphadenopathy:    He has no cervical adenopathy.  Neurological: He is alert and oriented to person, place, and time.  NO FOCAL DEFICITS  Psychiatric: He has a normal  mood and affect.  Vitals reviewed.     Assessment & Plan:

## 2017-11-03 NOTE — Assessment & Plan Note (Addendum)
NO BRBPR OR MELENA. NO WARNING SIGNS/SYMPTOMS  CONTINUE TO MONITOR SYMPTOMS. REVIEWED LABS AND ENDOSCOPY FROM 2016 TO PRESENT. NO INDICATION FOR ENDOSCOPY AT THIS TIME. FOLLOW UP IN ONE YEAR.

## 2017-11-03 NOTE — Assessment & Plan Note (Addendum)
LAST REMOVED MAR 2016. NO BRBPR OR MELENA. NO WARNING SIGNS/SYMPTOMS.  DRINK WATER TO KEEP YOUR URINE LIGHT YELLOW. FOLLOW A HIGH FIBER DIET. AVOID ITEMS THAT CAUSE BLOATING & GAS. PT DECLINES TO SCHEDULE COLONOSCOPY. DOESN'T WANT TO DRINK THE PREP AND WANTS TO SPEAK WITH DR. Meda Coffee. HE SHOULD COMPLETE COLONOSCOPY IN MAR 2019 BUT EXPLAINED TO PT HE NEEDS TO AT THE LATEST COMPLETE THE TCS  BY THE END OF 2019 BECAUSEHE HAD AN ADVANCED POLYP REMOVED IN [facility] 2016. PATIENT VOICED HIS UNDERSTANDING. FOLLOW UP IN 1 YEAR.

## 2017-11-03 NOTE — Assessment & Plan Note (Signed)
SYMPTOMS CONTROLLED/RESOLVED.  CONTINUE TO MONITOR SYMPTOMS. CONTINUE OMEPRAZOLE.  TAKE 30 MINUTES PRIOR TO YOUR FIRST MEAL.

## 2017-11-04 ENCOUNTER — Ambulatory Visit (INDEPENDENT_AMBULATORY_CARE_PROVIDER_SITE_OTHER): Payer: Medicare Other | Admitting: Family Medicine

## 2017-11-04 ENCOUNTER — Encounter: Payer: Self-pay | Admitting: Family Medicine

## 2017-11-04 VITALS — BP 138/82 | HR 86 | Temp 96.9°F | Ht 73.0 in | Wt 256.8 lb

## 2017-11-04 DIAGNOSIS — E039 Hypothyroidism, unspecified: Secondary | ICD-10-CM

## 2017-11-04 DIAGNOSIS — I10 Essential (primary) hypertension: Secondary | ICD-10-CM

## 2017-11-04 DIAGNOSIS — D126 Benign neoplasm of colon, unspecified: Secondary | ICD-10-CM

## 2017-11-04 DIAGNOSIS — E785 Hyperlipidemia, unspecified: Secondary | ICD-10-CM | POA: Diagnosis not present

## 2017-11-04 NOTE — Patient Instructions (Signed)
Need to follow up in six months We will notify you - hold  Walk every day that you are able  I will call Dr Oneida Alar about the colon cancer test

## 2017-11-04 NOTE — Progress Notes (Signed)
Chief Complaint  Patient presents with  . Follow-up   Patient is here for follow-up. He continues to live independently. He is compliant with his medication. His blood pressure is well controlled. He is a history of multiple polyps removed on his last colonoscopy in March 2016.  There were tubular adenoma and tubulovillous adenoma.  I explained to him that this puts him at increased risk for colon cancer.  He needs to get colonoscopy every 3 years.  He was told this by Dr. Oneida Alar in gastroenterology, but he is trying to put it off.  Basically he states he just hates drinking all the laxative.  I told him to inquire whether there is another way to clean his colon. He is not getting any regular exercise.  I reminded him that this would be good for his health.  He is limited because he has gout, arthritis in both of his wrist and both of his feet with occasional pain in his knees. He has regular pain medication from orthopedics.  I discussed with him to take this judiciously.  I reminded him that it can cause dizziness, drowsiness, falls, constipation. He takes his lipid lowering medicine.  He takes his thyroid replacement.  He is not due for lab work until November.  Patient Active Problem List   Diagnosis Date Noted  . Hyperlipidemia 10/23/2016  . Mild intellectual disabilities (CODE) 09/29/2016  . Personal history of gout 09/22/2016  . Literacy level of illiterate 09/22/2016  . History of bilateral knee replacement 09/22/2016  . Carcinoid tumor determined by biopsy of stomach 04/10/2016  . Tubulovillous adenoma of large intestine 09/12/2015  . Neuroendocrine tumor 09/25/2014  . Elevated LFTs 07/17/2014  . Iron deficiency anemia 07/29/2010  . Fatty liver 07/29/2010  . ABUSE, ALCOHOL, UNSPECIFIED 11/30/2008  . Osteoarthrosis involving lower leg 08/20/2008  . Essential hypertension 10/20/2006  . B12 deficiency 08/15/2006  . GERD 08/15/2006  . IBS 08/15/2006  . BPH without  obstruction/lower urinary tract symptoms 08/15/2006  . OSTEOARTHRITIS 08/15/2006  . DEGENERATION, DISC NOS 08/15/2006    Outpatient Encounter Medications as of 11/04/2017  Medication Sig  . amLODipine (NORVASC) 5 MG tablet   . atorvastatin (LIPITOR) 20 MG tablet Take 1 tablet (20 mg total) by mouth daily.  . clindamycin (CLINDAGEL) 1 % gel Apply topically 2 (two) times daily. (Patient taking differently: Apply 1 application topically daily as needed (to area(s) on the back of neck). )  . colchicine 0.6 MG tablet TAKE 1 TABLET BY MOUTH 3 TIMES DAILY X5 DAYS FOR GOUT PAIN. (Patient taking differently: Take 0.6 mg by mouth 3 (three) times daily. )  . febuxostat (ULORIC) 40 MG tablet Take 40 mg by mouth daily.  Marland Kitchen gabapentin (NEURONTIN) 300 MG capsule Take 300 mg by mouth 2 (two) times daily.   Marland Kitchen HYDROcodone-acetaminophen (NORCO/VICODIN) 5-325 MG tablet One tablet every six hours as needed for acute pain.  Must last 14 days.  . iron polysaccharides (NIFEREX) 150 MG capsule Take 150 mg by mouth daily.  Marland Kitchen levothyroxine (SYNTHROID, LEVOTHROID) 200 MCG tablet TAKE 1 TABLET DAILY BEFORE BREAKFAST.  . metoprolol succinate (TOPROL-XL) 25 MG 24 hr tablet Take 25 mg by mouth daily.  Marland Kitchen omeprazole (PRILOSEC) 20 MG capsule Take 1 capsule (20 mg total) by mouth daily.  . tamsulosin (FLOMAX) 0.4 MG CAPS capsule Take 1 capsule (0.4 mg total) by mouth daily.   No facility-administered encounter medications on file as of 11/04/2017.     Allergies  Allergen Reactions  .  Aspirin Other (See Comments)    REACTION: Diverticular Bleed    Review of Systems  Constitutional: Negative for activity change, appetite change, chills, fever and unexpected weight change.  HENT: Negative.  Negative for dental problem.   Eyes: Negative.  Negative for visual disturbance.  Respiratory: Negative.  Negative for cough and shortness of breath.   Cardiovascular: Negative for chest pain, palpitations and leg swelling.    Gastrointestinal: Negative.  Negative for diarrhea, nausea and vomiting.  Genitourinary: Negative for decreased urine volume and frequency.  Musculoskeletal: Positive for arthralgias and joint swelling.       Chronic, stable-wrists right greater than left  Skin: Positive for rash. Negative for color change.       Improved, folliculitis  Neurological: Negative for dizziness and light-headedness.  Psychiatric/Behavioral: Negative for decreased concentration and dysphoric mood. The patient is not nervous/anxious.     BP 138/82 (BP Location: Left Arm, Patient Position: Sitting, Cuff Size: Normal)   Pulse 86   Temp (!) 96.9 F (36.1 C) (Temporal)   Ht 6\' 1"  (1.854 m)   Wt 256 lb 12 oz (116.5 kg)   SpO2 97%   BMI 33.87 kg/m   Physical Exam  Constitutional: He appears well-developed and well-nourished.  Slow responses, cooperative  HENT:  Head: Normocephalic and atraumatic.  Mouth/Throat: Oropharynx is clear and moist.  Eyes: Conjunctivae are normal. Pupils are equal, round, and reactive to light.  Neck: Normal range of motion. Neck supple.  Cardiovascular: Normal rate, regular rhythm and normal heart sounds.  Pulmonary/Chest: Effort normal and breath sounds normal.  Musculoskeletal: Normal range of motion. He exhibits no edema.  No synovitis  Lymphadenopathy:    He has no cervical adenopathy.  Skin: Skin is warm and dry. No rash noted.  Psychiatric: His behavior is normal.  Poor fund of knowlege    ASSESSMENT/PLAN:  1. Essential hypertension Controlled  2. Hyperlipidemia, unspecified hyperlipidemia type Controlled  3. Hypothyroidism, unspecified type Controlled  4. Adenomatous polyp of colon, unspecified part of colon Patient agrees to colonoscopy.  I will notify Dr. Oneida Alar   Patient Instructions  Need to follow up in six months We will notify you - hold  Walk every day that you are able  I will call Dr Oneida Alar about the colon cancer test   Raylene Everts,  MD

## 2017-11-08 ENCOUNTER — Other Ambulatory Visit: Payer: Self-pay | Admitting: Family Medicine

## 2017-11-08 ENCOUNTER — Encounter: Payer: Self-pay | Admitting: Family Medicine

## 2017-11-12 ENCOUNTER — Telehealth: Payer: Self-pay | Admitting: Gastroenterology

## 2017-11-12 NOTE — Telephone Encounter (Signed)
TRIAGE PATIENT FOR ENDOSCOPY PT FOR TCS W/ MAC, Dx: PERSONAL HISTORY OF POLYP[S. CLEAR LIQUID DIET THE DAY BEFORE. NEEDS SUPREP OR PLENVU SAMPLE.

## 2017-11-15 ENCOUNTER — Other Ambulatory Visit: Payer: Self-pay

## 2017-11-15 DIAGNOSIS — Z86010 Personal history of colonic polyps: Secondary | ICD-10-CM

## 2017-11-15 MED ORDER — CLENPIQ 10-3.5-12 MG-GM -GM/160ML PO SOLN: 1.0000 | Freq: Once | ORAL | 0 refills | Status: AC

## 2017-11-15 NOTE — Telephone Encounter (Signed)
Called pt. TCS w/Propofol scheduled for 01/04/18 at 10:00am. Clenpiq rx sent to pharmacy (Suprep/Plenvu samples not available). Will mail instructions after pre-op appt is scheduled.

## 2017-11-15 NOTE — Telephone Encounter (Signed)
Tried to call pt to inform of pre-op appt 12/29/17 at 9:00am, no answer, LMOVM.  Pt then called office. Informed of pre-op appt. He hasn't had any medical or medicine changes since OV with [facility] 11/03/17.

## 2017-11-17 ENCOUNTER — Other Ambulatory Visit: Payer: Self-pay | Admitting: Gastroenterology

## 2017-11-17 DIAGNOSIS — D3A8 Other benign neuroendocrine tumors: Secondary | ICD-10-CM

## 2017-11-25 ENCOUNTER — Other Ambulatory Visit: Payer: Self-pay | Admitting: Orthopaedic Surgery

## 2017-11-25 MED ORDER — HYDROCODONE-ACETAMINOPHEN 5-325 MG PO TABS: ORAL_TABLET | ORAL | 0 refills | Status: DC

## 2017-11-25 NOTE — Telephone Encounter (Signed)
Patient called for refill - last seen by Dr Luna Glasgow; aware that Dr Aline Brochure is reviewing requests while Dr Luna Glasgow is out of clinic.   HYDROcodone-acetaminophen (NORCO/VICODIN) 5-325 MG tablet 55 tablet  Pharmacy is [facility]

## 2017-12-09 ENCOUNTER — Other Ambulatory Visit: Payer: Self-pay | Admitting: Orthopaedic Surgery

## 2017-12-09 MED ORDER — COLCHICINE 0.6 MG PO TABS: ORAL_TABLET | ORAL | 5 refills | Status: DC

## 2017-12-09 NOTE — Telephone Encounter (Signed)
Patient of Dr. Brooke Bonito requests refill on Colchicine 0.6 mgs.  Qty  15             Sig: TAKE 1 TABLET BY MOUTH 3 TIMES DAILY X5 DAYS FOR GOUT PAIN.   Patient states he uses [facility]

## 2017-12-21 ENCOUNTER — Other Ambulatory Visit: Payer: Self-pay | Admitting: Family Medicine

## 2017-12-22 ENCOUNTER — Telehealth: Payer: Self-pay | Admitting: Orthopaedic Surgery

## 2017-12-22 NOTE — Telephone Encounter (Signed)
Patient of Dr. Brooke Bonito requests refill on Hydrocodone/Acetaminophen 5-325 mgs.   Qty  73  Sig: One tablet every six hours as needed for acute pain. Must last 14 days.  Patient uses [facility]

## 2017-12-24 ENCOUNTER — Other Ambulatory Visit: Payer: Self-pay | Admitting: Orthopedic Surgery

## 2017-12-24 MED ORDER — HYDROCODONE-ACETAMINOPHEN 5-325 MG PO TABS: ORAL_TABLET | ORAL | 0 refills | Status: DC

## 2017-12-24 NOTE — Telephone Encounter (Signed)
done

## 2017-12-27 NOTE — Patient Instructions (Signed)
[name]  12/27/2017     @PREFPERIOPPHARMACY @   Your procedure is scheduled on  01/04/2018   Report to [facility] at  745   A.M.  Call this number if you have problems the morning of surgery:  [phone]   Remember:  Do not eat food or drink liquids after midnight.  Take these medicines the morning of surgery with A SIP OF WATER  Amlodipine, gabapentin, hydrocodone, levothyroxine, metoprolol, omeprazole, flomax, uloric   Do not wear jewelry, make-up or nail polish.  Do not wear lotions, powders, or perfumes, or deodorant.  Do not shave 48 hours prior to surgery.  Men may shave face and neck.  Do not bring valuables to the hospital.  [facility] is not responsible for any belongings or valuables.  Contacts, dentures or bridgework may not be worn into surgery.  Leave your suitcase in the car.  After surgery it may be brought to your room.  For patients admitted to the hospital, discharge time will be determined by your treatment team.  Patients discharged the day of surgery will not be allowed to drive home.   Name and phone number of your driver:   family Special instructions:  Follow the diet and prep instructions given to you by Dr Nona Dell office.  Please read over the following fact sheets that you were given. Anesthesia Post-op Instructions and Care and Recovery After Surgery       Colonoscopy, Adult A colonoscopy is an exam to look at the large intestine. It is done to check for problems, such as:  Lumps (tumors).  Growths (polyps).  Swelling (inflammation).  Bleeding.  What happens before the procedure? Eating and drinking Follow instructions from your doctor about eating and drinking. These instructions may include:  A few days before the procedure - follow a low-fiber diet. ? Avoid nuts. ? Avoid seeds. ? Avoid dried fruit. ? Avoid raw fruits. ? Avoid vegetables.  1-3 days before the procedure - follow a clear liquid diet. Avoid  liquids that have red or purple dye. Drink only clear liquids, such as: ? Clear broth or bouillon. ? Black coffee or tea. ? Clear juice. ? Clear soft drinks or sports drinks. ? Gelatin dessert. ? Popsicles.  On the day of the procedure - do not eat or drink anything during the 2 hours before the procedure.  Bowel prep If you were prescribed an oral bowel prep:  Take it as told by your doctor. Starting the day before your procedure, you will need to drink a lot of liquid. The liquid will cause you to poop (have bowel movements) until your poop is almost clear or light green.  If your skin or butt gets irritated from diarrhea, you may: ? Wipe the area with wipes that have medicine in them, such as adult wet wipes with aloe and vitamin E. ? Put something on your skin that soothes the area, such as petroleum jelly.  If you throw up (vomit) while drinking the bowel prep, take a break for up to 60 minutes. Then begin the bowel prep again. If you keep throwing up and you cannot take the bowel prep without throwing up, call your doctor.  General instructions  Ask your doctor about changing or stopping your normal medicines. This is important if you take diabetes medicines or blood thinners.  Plan to have someone take you home from the hospital or clinic. What happens during the procedure?  An IV tube may be put into one of your veins.  You will be given medicine to help you relax (sedative).  To reduce your risk of infection: ? Your doctors will wash their hands. ? Your anal area will be washed with soap.  You will be asked to lie on your side with your knees bent.  Your doctor will get a long, thin, flexible tube ready. The tube will have a camera and a light on the end.  The tube will be put into your anus.  The tube will be gently put into your large intestine.  Air will be delivered into your large intestine to keep it open. You may feel some pressure or cramping.  The  camera will be used to take photos.  A small tissue sample may be removed from your body to be looked at under a microscope (biopsy). If any possible problems are found, the tissue will be sent to a lab for testing.  If small growths are found, your doctor may remove them and have them checked for cancer.  The tube that was put into your anus will be slowly removed. The procedure may vary among doctors and hospitals. What happens after the procedure?  Your doctor will check on you often until the medicines you were given have worn off.  Do not drive for 24 hours after the procedure.  You may have a small amount of blood in your poop.  You may pass gas.  You may have mild cramps or bloating in your belly (abdomen).  It is up to you to get the results of your procedure. Ask your doctor, or the department performing the procedure, when your results will be ready. This information is not intended to replace advice given to you by your health care provider. Make sure you discuss any questions you have with your health care provider. Document Released: 09/19/2010 Document Revised: 06/17/2016 Document Reviewed: 10/29/2015 Elsevier Interactive Patient Education  2017 [employer]  Colonoscopy, Adult, Care After This sheet gives you information about how to care for yourself after your procedure. Your health care provider may also give you more specific instructions. If you have problems or questions, contact your health care provider. What can I expect after the procedure? After the procedure, it is common to have:  A small amount of blood in your stool for 24 hours after the procedure.  Some gas.  Mild abdominal cramping or bloating.  Follow these instructions at home: General instructions   For the first 24 hours after the procedure: ? Do not drive or use machinery. ? Do not sign important documents. ? Do not drink alcohol. ? Do your regular daily activities at a slower pace  than normal. ? Eat soft, easy-to-digest foods. ? Rest often.  Take over-the-counter or prescription medicines only as told by your health care provider.  It is up to you to get the results of your procedure. Ask your health care provider, or the department performing the procedure, when your results will be ready. Relieving cramping and bloating  Try walking around when you have cramps or feel bloated.  Apply heat to your abdomen as told by your health care provider. Use a heat source that your health care provider recommends, such as a moist heat pack or a heating pad. ? Place a towel between your skin and the heat source. ? Leave the heat on for 20-30 minutes. ? Remove the heat if your skin turns bright red. This is  especially important if you are unable to feel pain, heat, or cold. You may have a greater risk of getting burned. Eating and drinking  Drink enough fluid to keep your urine clear or pale yellow.  Resume your normal diet as instructed by your health care provider. Avoid heavy or fried foods that are hard to digest.  Avoid drinking alcohol for as long as instructed by your health care provider. Contact a health care provider if:  You have blood in your stool 2-3 days after the procedure. Get help right away if:  You have more than a small spotting of blood in your stool.  You pass large blood clots in your stool.  Your abdomen is swollen.  You have nausea or vomiting.  You have a fever.  You have increasing abdominal pain that is not relieved with medicine. This information is not intended to replace advice given to you by your health care provider. Make sure you discuss any questions you have with your health care provider. Document Released: 03/31/2004 Document Revised: 05/11/2016 Document Reviewed: 10/29/2015 Elsevier Interactive Patient Education  2018 [place] Anesthesia is a term that refers to techniques, procedures, and  medicines that help a person stay safe and comfortable during a medical procedure. Monitored anesthesia care, or sedation, is one type of anesthesia. Your anesthesia specialist may recommend sedation if you will be having a procedure that does not require you to be unconscious, such as:  Cataract surgery.  A dental procedure.  A biopsy.  A colonoscopy.  During the procedure, you may receive a medicine to help you relax (sedative). There are three levels of sedation:  Mild sedation. At this level, you may feel awake and relaxed. You will be able to follow directions.  Moderate sedation. At this level, you will be sleepy. You may not remember the procedure.  Deep sedation. At this level, you will be asleep. You will not remember the procedure.  The more medicine you are given, the deeper your level of sedation will be. Depending on how you respond to the procedure, the anesthesia specialist may change your level of sedation or the type of anesthesia to fit your needs. An anesthesia specialist will monitor you closely during the procedure. Let your health care provider know about:  Any allergies you have.  All medicines you are taking, including vitamins, herbs, eye drops, creams, and over-the-counter medicines.  Any use of steroids (by mouth or as a cream).  Any problems you or family members have had with sedatives and anesthetic medicines.  Any blood disorders you have.  Any surgeries you have had.  Any medical conditions you have, such as sleep apnea.  Whether you are pregnant or may be pregnant.  Any use of cigarettes, alcohol, or street drugs. What are the risks? Generally, this is a safe procedure. However, problems may occur, including:  Getting too much medicine (oversedation).  Nausea.  Allergic reaction to medicines.  Trouble breathing. If this happens, a breathing tube may be used to help with breathing. It will be removed when you are awake and breathing on  your own.  Heart trouble.  Lung trouble.  Before the procedure Staying hydrated Follow instructions from your health care provider about hydration, which may include:  Up to 2 hours before the procedure - you may continue to drink clear liquids, such as water, clear fruit juice, black coffee, and plain tea.  Eating and drinking restrictions Follow instructions from your health care provider  about eating and drinking, which may include:  8 hours before the procedure - stop eating heavy meals or foods such as meat, fried foods, or fatty foods.  6 hours before the procedure - stop eating light meals or foods, such as toast or cereal.  6 hours before the procedure - stop drinking milk or drinks that contain milk.  2 hours before the procedure - stop drinking clear liquids.  Medicines Ask your health care provider about:  Changing or stopping your regular medicines. This is especially important if you are taking diabetes medicines or blood thinners.  Taking medicines such as aspirin and ibuprofen. These medicines can thin your blood. Do not take these medicines before your procedure if your health care provider instructs you not to.  Tests and exams  You will have a physical exam.  You may have blood tests done to show: ? How well your kidneys and liver are working. ? How well your blood can clot.  General instructions  Plan to have someone take you home from the hospital or clinic.  If you will be going home right after the procedure, plan to have someone with you for 24 hours.  What happens during the procedure?  Your blood pressure, heart rate, breathing, level of pain and overall condition will be monitored.  An IV tube will be inserted into one of your veins.  Your anesthesia specialist will give you medicines as needed to keep you comfortable during the procedure. This may mean changing the level of sedation.  The procedure will be performed. After the  procedure  Your blood pressure, heart rate, breathing rate, and blood oxygen level will be monitored until the medicines you were given have worn off.  Do not drive for 24 hours if you received a sedative.  You may: ? Feel sleepy, clumsy, or nauseous. ? Feel forgetful about what happened after the procedure. ? Have a sore throat if you had a breathing tube during the procedure. ? Vomit. This information is not intended to replace advice given to you by your health care provider. Make sure you discuss any questions you have with your health care provider. Document Released: 05/13/2005 Document Revised: 01/24/2016 Document Reviewed: 12/08/2015 Elsevier Interactive Patient Education  2018 [place], Care After These instructions provide you with information about caring for yourself after your procedure. Your health care provider may also give you more specific instructions. Your treatment has been planned according to current medical practices, but problems sometimes occur. Call your health care provider if you have any problems or questions after your procedure. What can I expect after the procedure? After your procedure, it is common to:  Feel sleepy for several hours.  Feel clumsy and have poor balance for several hours.  Feel forgetful about what happened after the procedure.  Have poor judgment for several hours.  Feel nauseous or vomit.  Have a sore throat if you had a breathing tube during the procedure.  Follow these instructions at home: For at least 24 hours after the procedure:   Do not: ? Participate in activities in which you could fall or become injured. ? Drive. ? Use heavy machinery. ? Drink alcohol. ? Take sleeping pills or medicines that cause drowsiness. ? Make important decisions or sign legal documents. ? Take care of children on your own.  Rest. Eating and drinking  Follow the diet that is recommended by your health  care provider.  If you vomit, drink water, juice,  or soup when you can drink without vomiting.  Make sure you have little or no nausea before eating solid foods. General instructions  Have a responsible adult stay with you until you are awake and alert.  Take over-the-counter and prescription medicines only as told by your health care provider.  If you smoke, do not smoke without supervision.  Keep all follow-up visits as told by your health care provider. This is important. Contact a health care provider if:  You keep feeling nauseous or you keep vomiting.  You feel light-headed.  You develop a rash.  You have a fever. Get help right away if:  You have trouble breathing. This information is not intended to replace advice given to you by your health care provider. Make sure you discuss any questions you have with your health care provider. Document Released: 12/08/2015 Document Revised: 04/08/2016 Document Reviewed: 12/08/2015 Elsevier Interactive Patient Education  [employer].

## 2017-12-29 ENCOUNTER — Encounter (HOSPITAL_COMMUNITY)
Admission: RE | Admit: 2017-12-29 | Discharge: 2017-12-29 | Disposition: A | Discharge status: Home / Self Care | Payer: Medicare Other | Source: Ambulatory Visit | Attending: Gastroenterology | Admitting: Gastroenterology

## 2017-12-29 ENCOUNTER — Other Ambulatory Visit: Payer: Self-pay

## 2017-12-29 ENCOUNTER — Encounter (HOSPITAL_COMMUNITY): Payer: Self-pay

## 2017-12-29 DIAGNOSIS — I491 Atrial premature depolarization: Secondary | ICD-10-CM | POA: Diagnosis not present

## 2017-12-29 DIAGNOSIS — R9431 Abnormal electrocardiogram [ECG] [EKG]: Secondary | ICD-10-CM | POA: Diagnosis not present

## 2017-12-29 DIAGNOSIS — Z01812 Encounter for preprocedural laboratory examination: Secondary | ICD-10-CM | POA: Diagnosis not present

## 2017-12-29 DIAGNOSIS — Z01818 Encounter for other preprocedural examination: Secondary | ICD-10-CM | POA: Diagnosis not present

## 2017-12-29 LAB — CBC WITH DIFFERENTIAL/PLATELET
BASOS ABS: 0 10*3/uL (ref 0.0–0.1)
Basophils Relative: 0 %
EOS ABS: 0.3 10*3/uL (ref 0.0–0.7)
EOS PCT: 4 %
HCT: 43.6 % (ref 39.0–52.0)
Hemoglobin: 13.8 g/dL (ref 13.0–17.0)
Lymphocytes Relative: 41 %
Lymphs Abs: 2.7 10*3/uL (ref 0.7–4.0)
MCH: 30.4 pg (ref 26.0–34.0)
MCHC: 31.7 g/dL (ref 30.0–36.0)
MCV: 96 fL (ref 78.0–100.0)
MONO ABS: 0.7 10*3/uL (ref 0.1–1.0)
Monocytes Relative: 10 %
Neutro Abs: 2.9 10*3/uL (ref 1.7–7.7)
Neutrophils Relative %: 45 %
PLATELETS: 135 10*3/uL — AB (ref 150–400)
RBC: 4.54 MIL/uL (ref 4.22–5.81)
RDW: 13.9 % (ref 11.5–15.5)
WBC: 6.6 10*3/uL (ref 4.0–10.5)

## 2017-12-29 LAB — BASIC METABOLIC PANEL
Anion gap: 10 (ref 5–15)
BUN: 17 mg/dL (ref 6–20)
CO2: 24 mmol/L (ref 22–32)
Calcium: 9.3 mg/dL (ref 8.9–10.3)
Chloride: 106 mmol/L (ref 101–111)
Creatinine, Ser: 1.35 mg/dL — ABNORMAL HIGH (ref 0.61–1.24)
GFR calc Af Amer: >60 mL/min (ref >60)
GFR calc non Af Amer: 52 mL/min — ABNORMAL LOW (ref >60)
Glucose, Bld: 110 mg/dL — ABNORMAL HIGH (ref 65–99)
Potassium: 3.6 mmol/L (ref 3.5–5.1)
SODIUM: 140 mmol/L (ref 135–145)

## 2017-12-30 ENCOUNTER — Telehealth (HOSPITAL_COMMUNITY): Payer: Self-pay

## 2017-12-30 DIAGNOSIS — R202 Paresthesia of skin: Secondary | ICD-10-CM | POA: Insufficient documentation

## 2017-12-30 DIAGNOSIS — E039 Hypothyroidism, unspecified: Secondary | ICD-10-CM | POA: Insufficient documentation

## 2017-12-30 DIAGNOSIS — N401 Enlarged prostate with lower urinary tract symptoms: Secondary | ICD-10-CM | POA: Insufficient documentation

## 2017-12-30 DIAGNOSIS — M25562 Pain in left knee: Secondary | ICD-10-CM | POA: Diagnosis not present

## 2017-12-30 DIAGNOSIS — I10 Essential (primary) hypertension: Secondary | ICD-10-CM | POA: Diagnosis not present

## 2017-12-30 DIAGNOSIS — M25561 Pain in right knee: Secondary | ICD-10-CM | POA: Diagnosis not present

## 2017-12-30 DIAGNOSIS — I471 Supraventricular tachycardia, unspecified: Secondary | ICD-10-CM | POA: Insufficient documentation

## 2017-12-30 DIAGNOSIS — D500 Iron deficiency anemia secondary to blood loss (chronic): Secondary | ICD-10-CM

## 2017-12-30 MED ORDER — POLYSACCHARIDE IRON COMPLEX 150 MG PO CAPS: 150.0000 mg | ORAL_CAPSULE | Freq: Every day | ORAL | 6 refills | Status: DC

## 2017-12-30 MED FILL — FERREX 150 CAPSULE: 150 | 30 days supply | Qty: 30 | Fill #0

## 2017-12-30 NOTE — Telephone Encounter (Signed)
Reviewed with provider, prescription printed and given to [place], Development worker, community.

## 2017-12-30 NOTE — Telephone Encounter (Signed)
-----   Message from Epifanio Lesches sent at 12/29/2017  9:49 AM EDT ----- Script for Ferrex 150 forte cap  Print and give to me I have to send it to [facility] unless you can e-scribe it.  Thnx

## 2018-01-04 ENCOUNTER — Telehealth: Payer: Self-pay

## 2018-01-04 ENCOUNTER — Ambulatory Visit (HOSPITAL_COMMUNITY)
Admission: RE | Admit: 2018-01-04 | Discharge: 2018-01-04 | Disposition: A | Discharge status: Home / Self Care | Payer: Medicare Other | Source: Ambulatory Visit | Attending: Gastroenterology | Admitting: Gastroenterology

## 2018-01-04 ENCOUNTER — Encounter (HOSPITAL_COMMUNITY)
Admission: RE | Disposition: A | Discharge status: Home / Self Care | Payer: Self-pay | Source: Ambulatory Visit | Attending: Gastroenterology

## 2018-01-04 ENCOUNTER — Ambulatory Visit (HOSPITAL_COMMUNITY): Payer: Medicare Other | Admitting: Anesthesiology

## 2018-01-04 ENCOUNTER — Encounter (HOSPITAL_COMMUNITY): Payer: Self-pay | Admitting: Gastroenterology

## 2018-01-04 DIAGNOSIS — K648 Other hemorrhoids: Secondary | ICD-10-CM | POA: Insufficient documentation

## 2018-01-04 DIAGNOSIS — K219 Gastro-esophageal reflux disease without esophagitis: Secondary | ICD-10-CM | POA: Insufficient documentation

## 2018-01-04 DIAGNOSIS — M109 Gout, unspecified: Secondary | ICD-10-CM | POA: Insufficient documentation

## 2018-01-04 DIAGNOSIS — Z1211 Encounter for screening for malignant neoplasm of colon: Secondary | ICD-10-CM | POA: Diagnosis not present

## 2018-01-04 DIAGNOSIS — Z86010 Personal history of colonic polyps: Secondary | ICD-10-CM | POA: Diagnosis not present

## 2018-01-04 DIAGNOSIS — I10 Essential (primary) hypertension: Secondary | ICD-10-CM | POA: Diagnosis not present

## 2018-01-04 DIAGNOSIS — Z79899 Other long term (current) drug therapy: Secondary | ICD-10-CM | POA: Diagnosis not present

## 2018-01-04 DIAGNOSIS — K5730 Diverticulosis of large intestine without perforation or abscess without bleeding: Secondary | ICD-10-CM | POA: Diagnosis not present

## 2018-01-04 DIAGNOSIS — E039 Hypothyroidism, unspecified: Secondary | ICD-10-CM | POA: Diagnosis not present

## 2018-01-04 DIAGNOSIS — Z79890 Hormone replacement therapy (postmenopausal): Secondary | ICD-10-CM | POA: Diagnosis not present

## 2018-01-04 DIAGNOSIS — Z96653 Presence of artificial knee joint, bilateral: Secondary | ICD-10-CM | POA: Diagnosis not present

## 2018-01-04 DIAGNOSIS — Q439 Congenital malformation of intestine, unspecified: Secondary | ICD-10-CM | POA: Diagnosis not present

## 2018-01-04 DIAGNOSIS — D126 Benign neoplasm of colon, unspecified: Secondary | ICD-10-CM

## 2018-01-04 HISTORY — PX: COLONOSCOPY WITH PROPOFOL: SHX5780

## 2018-01-04 SURGERY — COLONOSCOPY WITH PROPOFOL
Anesthesia: Monitor Anesthesia Care

## 2018-01-04 MED ORDER — PROPOFOL 10 MG/ML IV BOLUS
INTRAVENOUS | Status: AC
  Filled 2018-01-04: qty 40

## 2018-01-04 MED ORDER — PROPOFOL 500 MG/50ML IV EMUL
INTRAVENOUS | Status: DC | PRN
  Administered 2018-01-04: 10:00:00 via INTRAVENOUS
  Administered 2018-01-04: 150 ug/kg/min via INTRAVENOUS

## 2018-01-04 MED ORDER — LACTATED RINGERS IV SOLN
INTRAVENOUS | Status: DC
  Administered 2018-01-04: 10:00:00 via INTRAVENOUS

## 2018-01-04 MED ORDER — METOPROLOL SUCCINATE ER 25 MG PO TB24: 37.5000 mg | ORAL_TABLET | Freq: Every day | ORAL | 6 refills | Status: DC

## 2018-01-04 MED ORDER — MIDAZOLAM HCL 2 MG/2ML IJ SOLN
INTRAMUSCULAR | Status: AC
  Filled 2018-01-04: qty 2

## 2018-01-04 NOTE — Transfer of Care (Signed)
Immediate Anesthesia Transfer of Care Note  Patient: [name]  Procedure(s) Performed: COLONOSCOPY WITH PROPOFOL (N/A )  Patient Location: PACU  Anesthesia Type:General  Level of Consciousness: awake, alert , oriented and patient cooperative  Airway & Oxygen Therapy: Patient Spontanous Breathing  Post-op Assessment: Report given to RN and Post -op Vital signs reviewed and stable  Post vital signs: Reviewed and stable  Last Vitals:  Vitals Value Taken Time  BP 109/84 01/04/2018 10:22 AM  Temp    Pulse 76 01/04/2018 10:28 AM  Resp 13 01/04/2018 10:28 AM  SpO2 97 % 01/04/2018 10:28 AM  Vitals shown include unvalidated device data.  Last Pain:  Vitals:   01/04/18 0832  TempSrc: Oral  PainSc: 0-No pain      Patients Stated Pain Goal: 4 (29/02/11 1552)  Complications: No apparent anesthesia complications

## 2018-01-04 NOTE — H&P (Signed)
Primary Care Physician:  Patient, No Pcp Per Primary Gastroenterologist:  Dr. Oneida Alar  Pre-Procedure History & Physical: HPI:  [name] is a 67 y.o. male here for PERSONAL HISTORY OF advanced POLYPS in Mar 2016.  Past Medical History:  Diagnosis Date  . Anemia    FeDA: GASTRIC POLYPS, B12; TCS 2008 EGD 2009, 2008-HB 11.1 MCV 83.6 CR 1.22, 2009 FERRITIN 102-22  . B12 deficiency   . Carcinoid tumor determined by biopsy of stomach 09/25/2014  . Chronic knee pain   . Diverticulosis of colon    Lower GI bleed 2008  . Essential hypertension   . GERD (gastroesophageal reflux disease)   . Gout   . Hepatomegaly    Hepatic steatosis  . History of alcohol abuse   . History of septic arthritis   . Hypothyroidism   . PUD   . Substance abuse [facility])     Past Surgical History:  Procedure Laterality Date  . BIOPSY  08/14/2014   Procedure: BIOPSY;  Surgeon: Danie Binder, MD;  Location: [facility];  Service: Endoscopy;;  . BIOPSY  03/12/2015   Procedure: BIOPSY;  Surgeon: Danie Binder, MD;  Location: [facility];  Service: Endoscopy;;  . BIOPSY  04/21/2016   Procedure: BIOPSY;  Surgeon: Danie Binder, MD;  Location: [facility];  Service: Endoscopy;;  bx's of antrum, body of stomach, fundus, and cardia  . CHOLECYSTECTOMY    . COLONOSCOPY  2008 [facility] DJ   LGIB 2o to TICS, prep good  . COLONOSCOPY WITH PROPOFOL N/A 08/14/2014   SLF: 1. Four large colon polyps removed. 2. The left colon is redundant 3. Moderate diverticulosis throughout teh entire examined colon  . COLONOSCOPY WITH PROPOFOL N/A 11/06/2014   SLF: 8 small polyps removed. One large pedunculated polyp removed from the ascending colon, tubulovillous and tubular adenomas. Next colonoscopy March 2019  . ESOPHAGOGASTRODUODENOSCOPY  12/08/2007   ERX:VQMG gastric polyps seen in the cardia and body of the stomach/Normal esophagus without evidence of Barrett's, mass, erosion/Normal duodenal bulb and second portion of the duodenum. Benign bx.  .  ESOPHAGOGASTRODUODENOSCOPY (EGD) WITH PROPOFOL N/A 08/14/2014   SLF: 1. Heme postive stools due to gastric and colon polyps 2. Multiple gastric   . ESOPHAGOGASTRODUODENOSCOPY (EGD) WITH PROPOFOL N/A 03/12/2015   SLF: Multiple gastric nodules seen in gasric body/antrum. 2. Non-erosive gastritis ( inflammation) was found in the gastric antrum.   . ESOPHAGOGASTRODUODENOSCOPY (EGD) WITH PROPOFOL N/A 10/08/2015   Procedure: ESOPHAGOGASTRODUODENOSCOPY (EGD) WITH PROPOFOL;  Surgeon: Danie Binder, MD;  Location: [facility];  Service: Endoscopy;  Laterality: N/A;  0945  . ESOPHAGOGASTRODUODENOSCOPY (EGD) WITH PROPOFOL N/A 04/21/2016   Procedure: ESOPHAGOGASTRODUODENOSCOPY (EGD) WITH PROPOFOL;  Surgeon: Danie Binder, MD;  Location: [facility];  Service: Endoscopy;  Laterality: N/A;  730   . HAND SURGERY Right    fracture repair with plates  . JOINT REPLACEMENT Bilateral    knees  . POLYPECTOMY  08/14/2014   Procedure: POLYPECTOMY;  Surgeon: Danie Binder, MD;  Location: [facility];  Service: Endoscopy;;  . POLYPECTOMY N/A 11/06/2014   Procedure: POLYPECTOMY;  Surgeon: Danie Binder, MD;  Location: [facility];  Service: Endoscopy;  Laterality: N/A;  Transverse Colon x3, Ascending Colon x2, Descending Colon x3  . REPLACEMENT TOTAL KNEE BILATERAL    . UPPER GASTROINTESTINAL ENDOSCOPY  APR 2009   INFLAMED HYPERPLASTIC POLYPS, CHRONIC GASTRITIS    Prior to Admission medications   Medication Sig Start Date End Date Taking? Authorizing Provider  amLODipine (NORVASC) 5 MG tablet Take 5 mg by mouth daily.  09/13/17  Yes [provider]  atorvastatin (LIPITOR) 20 MG tablet Take 1 tablet (20 mg total) by mouth daily. 10/23/16  Yes Raylene Everts, MD  clindamycin (CLINDAGEL) 1 % gel Apply topically 2 (two) times daily. Patient taking differently: Apply 1 application topically daily as needed (to area(s) on the back of neck).  07/20/17  Yes Raylene Everts, MD  colchicine 0.6 MG tablet TAKE 1 TABLET  BY MOUTH 3 TIMES DAILY X5 DAYS FOR GOUT PAIN. 12/09/17  Yes Carole Civil, MD  gabapentin (NEURONTIN) 300 MG capsule Take 300 mg by mouth 2 (two) times daily.  09/22/16  Yes [provider]  HYDROcodone-acetaminophen (NORCO/VICODIN) 5-325 MG tablet One tablet every six hours as needed for acute pain.  Must last 14 days. 12/24/17  Yes Carole Civil, MD  levothyroxine (SYNTHROID, LEVOTHROID) 200 MCG tablet TAKE 1 TABLET DAILY BEFORE BREAKFAST. 06/08/17  Yes Fayrene Helper, MD  metoprolol succinate (TOPROL-XL) 25 MG 24 hr tablet Take 25 mg by mouth daily.   Yes [provider]  omeprazole (PRILOSEC) 20 MG capsule TAKE 1 CAPSULE BY MOUTH ONCE DAILY. 11/19/17  Yes Carlis Stable, NP  tamsulosin (FLOMAX) 0.4 MG CAPS capsule TAKE ONE CAPSULE BY MOUTH ONCE DAILY. 11/08/17  Yes Raylene Everts, MD  ULORIC 40 MG tablet TAKE 1 TABLET BY MOUTH ONCE A DAY. 12/21/17  Yes Raylene Everts, MD  iron polysaccharides (NIFEREX) 150 MG capsule Take 1 capsule (150 mg total) by mouth daily. 12/30/17   Derek Jack, MD    Allergies as of 11/15/2017 - Review Complete 11/04/2017  Allergen Reaction Noted  . Aspirin Other (See Comments) 06/01/2007    Family History  Problem Relation Age of Onset  . Diabetes Mother   . Renal Disease Mother        failure  . Diabetes Father        'old age'  . Hypertension Sister   . Hypertension Brother   . Diabetes Brother   . Diabetes Sister   . Diabetes Sister   . Diabetes Brother   . Hypertension Brother   . Diabetes Brother        right BKA  . Kidney failure Brother        on dialysis  . Kidney failure Brother        on dialysis  . Diabetes Brother        bilateral BKA  . Colon polyps Neg Hx   . Colon cancer Neg Hx   . Pancreatic disease Neg Hx     Social History   Socioeconomic History  . Marital status: Single    Spouse name: Not on file  . Number of children: 0  . Years of education: 3  . Highest education level: Not  on file  Occupational History  . Occupation: retired    Comment: farming  Social Needs  . Financial resource strain: Not on file  . Food insecurity:    Worry: Not on file    Inability: Not on file  . Transportation needs:    Medical: Not on file    Non-medical: Not on file  Tobacco Use  . Smoking status: Never Smoker  . Smokeless tobacco: Never Used  . Tobacco comment: Quit x 10 years/ never smoked on regular basis  Substance and Sexual Activity  . Alcohol use: Yes    Alcohol/week: 1.2 oz  Types: 2 Shots of liquor per week    Comment: drinks on weekends, gin/vodka 1/5th.  . Drug use: No  . Sexual activity: Never  Lifestyle  . Physical activity:    Days per week: Not on file    Minutes per session: Not on file  . Stress: Not on file  Relationships  . Social connections:    Talks on phone: Not on file    Gets together: Not on file    Attends religious service: Not on file    Active member of club or organization: Not on file    Attends meetings of clubs or organizations: Not on file    Relationship status: Not on file  . Intimate partner violence:    Fear of current or ex partner: Not on file    Emotionally abused: Not on file    Physically abused: Not on file    Forced sexual activity: Not on file  Other Topics Concern  . Not on file  Social History Narrative   HE DOES NOT HAVE ANY CHILDREN.   Lives alone   Does not drive   CAN NOT READ    Review of Systems: See HPI, otherwise negative ROS   Physical Exam: BP (!) 142/99   Pulse 84   Temp 98.7 F (37.1 C) (Oral)   Resp 16   SpO2 97%  General:   Alert,  pleasant and cooperative in NAD Head:  Normocephalic and atraumatic. Neck:  Supple; Lungs:  Clear throughout to auscultation.    Heart:  Regular rate and rhythm. Abdomen:  Soft, nontender and nondistended. Normal bowel sounds, without guarding, and without rebound.   Neurologic:  Alert and  oriented x4;  grossly normal  neurologically.  Impression/Plan:     PERSONAL HISTORY OF advanced POLYPS in Mar 2016.  PLAN: 1. TCS TODAY DISCUSSED PROCEDURE, BENEFITS, & RISKS: < 1% chance of medication reaction, bleeding, perforation, or rupture of spleen/liver.

## 2018-01-04 NOTE — Addendum Note (Signed)
Addendum  created 01/04/18 1037 by Mickel Baas, CRNA   Review and Sign - Signed, Sign clinical note

## 2018-01-04 NOTE — Telephone Encounter (Signed)
I spoke with patient, he understands to increase his Toprol to 37.5 mg ( 1 1/2 pills ) per day

## 2018-01-04 NOTE — Op Note (Signed)
[facility] Patient Name: [name] Procedure Date: 01/04/2018 9:31 AM MRN: [id number] Date of Birth: 1951/03/17 Attending MD: Barney Drain MD, MD CSN: [id number] Age: 67 Admit Type: Outpatient Procedure:                Colonoscopy, SURVEILLANCE Indications:              Personal history of ADVANCED colonic polyps MAR                            2016: 2 CM TVA IN ASCENDING COLON AND MULTIPLE                            SIMPLE ADENOMAS Providers:                Barney Drain MD, MD, Janeece Riggers, RN, Aram Candela Referring MD:             Lysle Morales Medicines:                Propofol per Anesthesia Complications:            No immediate complications. Estimated Blood Loss:     Estimated blood loss: none. Procedure:                Pre-Anesthesia Assessment:                           - Prior to the procedure, a History and Physical                            was performed, and patient medications and                            allergies were reviewed. The patient's tolerance of                            previous anesthesia was also reviewed. The risks                            and benefits of the procedure and the sedation                            options and risks were discussed with the patient.                            All questions were answered, and informed consent                            was obtained. Prior Anticoagulants: The patient has                            taken no previous anticoagulant or antiplatelet                            agents. ASA Grade Assessment: II - A patient with  mild systemic disease. After reviewing the risks                            and benefits, the patient was deemed in                            satisfactory condition to undergo the procedure.                            After obtaining informed consent, the colonoscope                            was passed under direct vision. Throughout the           procedure, the patient's blood pressure, pulse, and                            oxygen saturations were monitored continuously. The                            EC-[id number] ([id number]) scope was introduced through                            the anus and advanced to the the cecum, identified                            by appendiceal orifice and ileocecal valve. The                            colonoscopy was somewhat difficult due to a                            tortuous colon. Successful completion of the                            procedure was aided by straightening and shortening                            the scope to obtain bowel loop reduction and                            COLOWRAP. The patient tolerated the procedure well.                            The quality of the bowel preparation was excellent.                            The ileocecal valve, appendiceal orifice, and                            rectum were photographed. Scope In: 10:03:53 AM Scope Out: 10:16:06 AM Scope Withdrawal Time: 0 hours 10 minutes 14 seconds  Total Procedure Duration: 0 hours 12 minutes 13 seconds  Findings:      Multiple small and large-mouthed  diverticula were found in the entire       colon.      The recto-sigmoid colon and sigmoid colon were mildly tortuous.      Internal hemorrhoids were found during retroflexion. The hemorrhoids       were moderate. Impression:               - SEVERE Diverticulosis in the entire examined                            colon.                           - Tortuous LEFT colon.                           - Internal hemorrhoids. Moderate Sedation:      Per Anesthesia Care Recommendation:           - Repeat colonoscopy in 5 years for surveillance.                           - High fiber diet.                           - Continue present medications.                           - Patient has a contact number available for                            emergencies. The signs  and symptoms of potential                            delayed complications were discussed with the                            patient. Return to normal activities tomorrow.                            Written discharge instructions were provided to the                            patient. Procedure Code(s):        --- Professional ---                           [phone], Colonoscopy, flexible; diagnostic, including                            collection of specimen(s) by brushing or washing,                            when performed (separate procedure) Diagnosis Code(s):        --- Professional ---                           C14.4, Other hemorrhoids  Z86.010, Personal history of colonic polyps                           K57.30, Diverticulosis of large intestine without                            perforation or abscess without bleeding                           Q43.8, Other specified congenital malformations of                            intestine CPT copyright 2017 [employer]. All rights reserved. The codes documented in this report are preliminary and upon coder review may  be revised to meet current compliance requirements. Barney Drain, MD Barney Drain MD, MD 01/04/2018 10:30:29 AM This report has been signed electronically. Number of Addenda: 0

## 2018-01-04 NOTE — Discharge Instructions (Signed)
You DID NOT HAVE ANY POLYPS. YOU HAVE DIVERTICULOSIS IN YOUR ENTIRE COLON. You have MODERATE internal AND EXTERNAL hemorrhoids.   DRINK WATER TO KEEP URINE LIGHT YELLOW.  CONTINUE YOUR WEIGHT LOSS EFFORTS.  FOLLOW A HIGH FIBER DIET. AVOID ITEMS THAT CAUSE BLOATING & GAS. SEE INFO BELOW.  FOLLOW UP IN MAR 2020 WITH DR. FIELDS.  Next colonoscopy in 5 years.   Colonoscopy Care After Read the instructions outlined below and refer to this sheet in the next week. These discharge instructions provide you with general information on caring for yourself after you leave the hospital. While your treatment has been planned according to the most current medical practices available, unavoidable complications occasionally occur. If you have any problems or questions after discharge, call DR. FIELDS, [phone].  ACTIVITY  You may resume your regular activity, but move at a slower pace for the next 24 hours.   Take frequent rest periods for the next 24 hours.   Walking will help get rid of the air and reduce the bloated feeling in your belly (abdomen).   No driving for 24 hours (because of the medicine (anesthesia) used during the test).   You may shower.   Do not sign any important legal documents or operate any machinery for 24 hours (because of the anesthesia used during the test).    NUTRITION  Drink plenty of fluids.   You may resume your normal diet as instructed by your doctor.   Begin with a light meal and progress to your normal diet. Heavy or fried foods are harder to digest and may make you feel sick to your stomach (nauseated).   Avoid alcoholic beverages for 24 hours or as instructed.    MEDICATIONS  You may resume your normal medications.   WHAT YOU CAN EXPECT TODAY  Some feelings of bloating in the abdomen.   Passage of more gas than usual.   Spotting of blood in your stool or on the toilet paper  .  IF YOU HAD POLYPS REMOVED DURING THE COLONOSCOPY:  Eat a  soft diet IF YOU HAVE NAUSEA, BLOATING, ABDOMINAL PAIN, OR VOMITING.    FINDING OUT THE RESULTS OF YOUR TEST Not all test results are available during your visit. DR. Oneida Alar WILL CALL YOU WITHIN 7 DAYS OF YOUR PROCEDUE WITH YOUR RESULTS. Do not assume everything is normal if you have not heard from DR. FIELDS IN ONE WEEK, CALL HER OFFICE AT [phone].  SEEK IMMEDIATE MEDICAL ATTENTION AND CALL THE OFFICE: [phone] IF:  You have more than a spotting of blood in your stool.   Your belly is swollen (abdominal distention).   You are nauseated or vomiting.   You have a temperature over 101F.   You have abdominal pain or discomfort that is severe or gets worse throughout the day.    High-Fiber Diet A high-fiber diet changes your normal diet to include more whole grains, legumes, fruits, and vegetables. Changes in the diet involve replacing refined carbohydrates with unrefined foods. The calorie level of the diet is essentially unchanged. The Dietary Reference Intake (recommended amount) for adult males is 38 grams per day. For adult females, it is 25 grams per day. Pregnant and lactating women should consume 28 grams of fiber per day. Fiber is the intact part of a plant that is not broken down during digestion. Functional fiber is fiber that has been isolated from the plant to provide a beneficial effect in the body. PURPOSE  Increase stool bulk.  Ease and regulate bowel movements.   Lower cholesterol.   REDUCE RISK OF COLON CANCER  INDICATIONS THAT YOU NEED MORE FIBER  Constipation and hemorrhoids.   Uncomplicated diverticulosis (intestine condition) and irritable bowel syndrome.   Weight management.   As a protective measure against hardening of the arteries (atherosclerosis), diabetes, and cancer.   GUIDELINES FOR INCREASING FIBER IN THE DIET  Start adding fiber to the diet slowly. A gradual increase of about 5 more grams (2 slices of whole-wheat bread, 2 servings of  most fruits or vegetables, or 1 bowl of high-fiber cereal) per day is best. Too rapid an increase in fiber may result in constipation, flatulence, and bloating.   Drink enough water and fluids to keep your urine clear or pale yellow. Water, juice, or caffeine-free drinks are recommended. Not drinking enough fluid may cause constipation.   Eat a variety of high-fiber foods rather than one type of fiber.   Try to increase your intake of fiber through using high-fiber foods rather than fiber pills or supplements that contain small amounts of fiber.   The goal is to change the types of food eaten. Do not supplement your present diet with high-fiber foods, but replace foods in your present diet.   INCLUDE A VARIETY OF FIBER SOURCES  Replace refined and processed grains with whole grains, canned fruits with fresh fruits, and incorporate other fiber sources. White rice, white breads, and most bakery goods contain little or no fiber.   Brown whole-grain rice, buckwheat oats, and many fruits and vegetables are all good sources of fiber. These include: broccoli, Brussels sprouts, cabbage, cauliflower, beets, sweet potatoes, white potatoes (skin on), carrots, tomatoes, eggplant, squash, berries, fresh fruits, and dried fruits.   Cereals appear to be the richest source of fiber. Cereal fiber is found in whole grains and bran. Bran is the fiber-rich outer coat of cereal grain, which is largely removed in refining. In whole-grain cereals, the bran remains. In breakfast cereals, the largest amount of fiber is found in those with "bran" in their names. The fiber content is sometimes indicated on the label.   You may need to include additional fruits and vegetables each day.   In baking, for 1 cup white flour, you may use the following substitutions:   1 cup whole-wheat flour minus 2 tablespoons.   1/2 cup white flour plus 1/2 cup whole-wheat flour.   Diverticulosis Diverticulosis is a common condition  that develops when small pouches (diverticula) form in the wall of the colon. The risk of diverticulosis increases with age. It happens more often in people who eat a low-fiber diet. Most individuals with diverticulosis have no symptoms. Those individuals with symptoms usually experience belly (abdominal) pain, constipation, or loose stools (diarrhea).  HOME CARE INSTRUCTIONS  Increase the amount of fiber in your diet as directed by your caregiver or dietician. This may reduce symptoms of diverticulosis.   Drink at least 6 to 8 glasses of water each day to prevent constipation.   Try not to strain when you have a bowel movement.   THERE IS NO NEED TO Avoid nuts and seeds to prevent complications.    FOODS HAVING HIGH FIBER CONTENT INCLUDE:  Fruits. Apple, peach, pear, tangerine, raisins, prunes.   Vegetables. Brussels sprouts, asparagus, broccoli, cabbage, carrot, cauliflower, romaine lettuce, spinach, summer squash, tomato, winter squash, zucchini.   Starchy Vegetables. Baked beans, kidney beans, lima beans, split peas, lentils, potatoes (with skin).   Grains. Whole wheat bread, brown rice, bran  flake cereal, plain oatmeal, white rice, shredded wheat, bran muffins.    Hemorrhoids Hemorrhoids are dilated (enlarged) veins around the rectum. Sometimes clots will form in the veins. This makes them swollen and painful. These are called thrombosed hemorrhoids. Causes of hemorrhoids include:  Constipation.   Straining to have a bowel movement.   HEAVY LIFTING  HOME CARE INSTRUCTIONS  Eat a well balanced diet and drink 6 to 8 glasses of water every day to avoid constipation. You may also use a bulk laxative.   Avoid straining to have bowel movements.   Keep anal area dry and clean.   Do not use a donut shaped pillow or sit on the toilet for long periods. This increases blood pooling and pain.   Move your bowels when your body has the urge; this will require less straining and will  decrease pain and pressure.

## 2018-01-04 NOTE — Progress Notes (Signed)
Patient with HR in the 140's.  Patient instructed to bare down with HR breaking into the 70's.  Within minutes the HR would climb up to the 140's again.  Dr Hilaria Ota notified of HR.  Dr Hilaria Ota stated that Cardiology was consulted and that Dr Harl Bowie was to come see him before discharge.  Patient has been asymptomatic during this event.

## 2018-01-04 NOTE — Addendum Note (Signed)
Addendum  created 01/04/18 1102 by Mickel Baas, CRNA   Sign clinical note

## 2018-01-04 NOTE — Telephone Encounter (Signed)
-----   Message from [name], MD sent at 01/04/2018 11:00 AM EDT ----- Can we have this patient increase his Toprol to 37.5mg  daily. I saw him in the pacu and was having some runs of SVT. Can we call his pharmacy with new Rx, and call patient later today with reminder to make change in meds, Im not sure he understood what I was saying in the PACU.    Carlyle Dolly MD

## 2018-01-04 NOTE — Anesthesia Postprocedure Evaluation (Addendum)
Anesthesia Post Note  Patient: [name]  Procedure(s) Performed: COLONOSCOPY WITH PROPOFOL (N/A )  Patient location during evaluation: PACU Anesthesia Type: General Level of consciousness: awake and alert, oriented and patient cooperative Pain management: pain level controlled Vital Signs Assessment: post-procedure vital signs reviewed and stable Respiratory status: respiratory function stable and spontaneous breathing Cardiovascular status: stable (Patient continues to have intermittent runs of SVT ; Dr. Lurlean Horns aware; Patient denies chest pain, SOB, and states that his heart) Postop Assessment: no apparent nausea or vomiting Anesthetic complications: no     Last Vitals:  Vitals:   01/04/18 0832 01/04/18 0846  BP: (!) 142/99   Pulse: 84   Resp: 18 16  Temp: 37.1 C   SpO2: 97% 97%    Last Pain:  Vitals:   01/04/18 0832  TempSrc: Oral  PainSc: 0-No pain                 Breckin Savannah A

## 2018-01-04 NOTE — Telephone Encounter (Signed)
escribed toprol to [employer], lm for pt to call back

## 2018-01-04 NOTE — Anesthesia Preprocedure Evaluation (Addendum)
Anesthesia Evaluation  Patient identified by MRN, date of birth, ID band Patient awake    Reviewed: Allergy & Precautions, H&P , NPO status , Patient's Chart, lab work & pertinent test results  Airway Mallampati: II  TM Distance: >3 FB Neck ROM: full  Mouth opening: Limited Mouth Opening  Dental   Pulmonary neg pulmonary ROS,    breath sounds clear to auscultation       Cardiovascular hypertension, On Medications negative cardio ROS  II Rhythm:regular Rate:Normal     Neuro/Psych PSYCHIATRIC DISORDERS negative neurological ROS  negative psych ROS   GI/Hepatic negative GI ROS, Neg liver ROS, PUD, GERD  ,  Endo/Other  negative endocrine ROSHypothyroidism   Renal/GU negative Renal ROS     Musculoskeletal   Abdominal   Peds  Hematology   Anesthesia Other Findings   Reproductive/Obstetrics negative OB ROS                             Anesthesia Physical Anesthesia Plan  ASA: II  Anesthesia Plan: General   Post-op Pain Management:    Induction:   PONV Risk Score and Plan:   Airway Management Planned: Mask  Additional Equipment:   Intra-op Plan:   Post-operative Plan:   Informed Consent: I have reviewed the patients History and Physical, chart, labs and discussed the procedure including the risks, benefits and alternatives for the proposed anesthesia with the patient or authorized representative who has indicated his/her understanding and acceptance.     Plan Discussed with: CRNA  Anesthesia Plan Comments:        Anesthesia Quick Evaluation

## 2018-01-10 ENCOUNTER — Encounter (HOSPITAL_COMMUNITY): Payer: Self-pay | Admitting: Gastroenterology

## 2018-01-11 ENCOUNTER — Encounter: Payer: Self-pay | Admitting: Orthopaedic Surgery

## 2018-01-11 ENCOUNTER — Ambulatory Visit (INDEPENDENT_AMBULATORY_CARE_PROVIDER_SITE_OTHER): Payer: Medicare Other | Admitting: Orthopaedic Surgery

## 2018-01-11 VITALS — BP 144/95 | HR 99 | Ht 73.0 in | Wt 244.0 lb

## 2018-01-11 DIAGNOSIS — G894 Chronic pain syndrome: Secondary | ICD-10-CM

## 2018-01-11 DIAGNOSIS — Z96653 Presence of artificial knee joint, bilateral: Secondary | ICD-10-CM

## 2018-01-11 DIAGNOSIS — M10042 Idiopathic gout, left hand: Secondary | ICD-10-CM

## 2018-01-11 MED ORDER — HYDROCODONE-ACETAMINOPHEN 5-325 MG PO TABS: ORAL_TABLET | ORAL | 0 refills | Status: DC

## 2018-01-11 NOTE — Progress Notes (Signed)
Patient [name], male DOB:12/26/1950, 67 y.o. OHY:[id number]  Chief Complaint  Patient presents with  . Follow-up    Gout    HPI  [name] is a 67 y.o. male who has chronic gout.  He has had some knee pain at times.  He has bilateral total knees.  His hands and wrists have had some swelling but he is taking his gout medicine.  He has had one episode of marked pain a couple of weeks ago of the left wrist but it is better today. I have gone over gout management again with him.  HPI  Body mass index is 32.19 kg/m.  ROS  Review of Systems  HENT: Negative for congestion.   Respiratory: Negative for cough and shortness of breath.   Cardiovascular: Negative for chest pain and leg swelling.  Endocrine: Positive for cold intolerance.  Musculoskeletal: Positive for arthralgias and joint swelling.  Allergic/Immunologic: Positive for environmental allergies.  All other systems reviewed and are negative.   Past Medical History:  Diagnosis Date  . Anemia    FeDA: GASTRIC POLYPS, B12; TCS 2008 EGD 2009, 2008-HB 11.1 MCV 83.6 CR 1.22, 2009 FERRITIN 102-22  . B12 deficiency   . Carcinoid tumor determined by biopsy of stomach 09/25/2014  . Chronic knee pain   . Diverticulosis of colon    Lower GI bleed 2008  . Essential hypertension   . GERD (gastroesophageal reflux disease)   . Gout   . Hepatomegaly    Hepatic steatosis  . History of alcohol abuse   . History of septic arthritis   . Hypothyroidism   . PUD   . Substance abuse [facility])     Past Surgical History:  Procedure Laterality Date  . BIOPSY  08/14/2014   Procedure: BIOPSY;  Surgeon: Danie Binder, MD;  Location: [facility];  Service: Endoscopy;;  . BIOPSY  03/12/2015   Procedure: BIOPSY;  Surgeon: Danie Binder, MD;  Location: [facility];  Service: Endoscopy;;  . BIOPSY  04/21/2016   Procedure: BIOPSY;  Surgeon: Danie Binder, MD;  Location[facility];  Service: Endoscopy;;  bx's of antrum, body of stomach, fundus, and  cardia  . CHOLECYSTECTOMY    . COLONOSCOPY  2008 [facility] DJ   LGIB 2o to TICS, prep good  . COLONOSCOPY WITH PROPOFOL N/A 08/14/2014   SLF: 1. Four large colon polyps removed. 2. The left colon is redundant 3. Moderate diverticulosis throughout teh entire examined colon  . COLONOSCOPY WITH PROPOFOL N/A 11/06/2014   SLF: 8 small polyps removed. One large pedunculated polyp removed from the ascending colon, tubulovillous and tubular adenomas. Next colonoscopy March 2019  . COLONOSCOPY WITH PROPOFOL N/A 01/04/2018   Procedure: COLONOSCOPY WITH PROPOFOL;  Surgeon: Danie Binder, MD;  [facility]: [facility];  Service: Endoscopy;  Laterality: N/A;  10:00am  . ESOPHAGOGASTRODUODENOSCOPY  12/08/2007   RSW:NIOE gastric polyps seen in the cardia and body of the stomach/Normal esophagus without evidence of Barrett's, mass, erosion/Normal duodenal bulb and second portion of the duodenum. Benign bx.  . ESOPHAGOGASTRODUODENOSCOPY (EGD) WITH PROPOFOL N/A 08/14/2014   SLF: 1. Heme postive stools due to gastric and colon polyps 2. Multiple gastric   . ESOPHAGOGASTRODUODENOSCOPY (EGD) WITH PROPOFOL N/A 03/12/2015   SLF: Multiple gastric nodules seen in gasric body/antrum. 2. Non-erosive gastritis ( inflammation) was found in the gastric antrum.   . ESOPHAGOGASTRODUODENOSCOPY (EGD) WITH PROPOFOL N/A 10/08/2015   Procedure: ESOPHAGOGASTRODUODENOSCOPY (EGD) WITH PROPOFOL;  Surgeon: Danie Binder, MD;  [facility]  [facility];  Service: Endoscopy;  Laterality: N/A;  0945  . ESOPHAGOGASTRODUODENOSCOPY (EGD) WITH PROPOFOL N/A 04/21/2016   Procedure: ESOPHAGOGASTRODUODENOSCOPY (EGD) WITH PROPOFOL;  Surgeon: Danie Binder, MD;  [facility];  Service: Endoscopy;  Laterality: N/A;  730   . HAND SURGERY Right    fracture repair with plates  . JOINT REPLACEMENT Bilateral    knees  . POLYPECTOMY  08/14/2014   Procedure: POLYPECTOMY;  Surgeon: Danie Binder, MD;  Location: [facility];  Service: Endoscopy;;  .  POLYPECTOMY N/A 11/06/2014   Procedure: POLYPECTOMY;  Surgeon: Danie Binder, MD;  Location: [facility];  Service: Endoscopy;  Laterality: N/A;  Transverse Colon x3, Ascending Colon x2, Descending Colon x3  . REPLACEMENT TOTAL KNEE BILATERAL    . UPPER GASTROINTESTINAL ENDOSCOPY  APR 2009   INFLAMED HYPERPLASTIC POLYPS, CHRONIC GASTRITIS    Family History  Problem Relation Age of Onset  . Diabetes Mother   . Renal Disease Mother        failure  . Diabetes Father        'old age'  . Hypertension Sister   . Hypertension Brother   . Diabetes Brother   . Diabetes Sister   . Diabetes Sister   . Diabetes Brother   . Hypertension Brother   . Diabetes Brother        right BKA  . Kidney failure Brother        on dialysis  . Kidney failure Brother        on dialysis  . Diabetes Brother        bilateral BKA  . Colon polyps Neg Hx   . Colon cancer Neg Hx   . Pancreatic disease Neg Hx     Social History Social History   Tobacco Use  . Smoking status: Never Smoker  . Smokeless tobacco: Never Used  . Tobacco comment: Quit x 10 years/ never smoked on regular basis  Substance Use Topics  . Alcohol use: Yes    Alcohol/week: 1.2 oz    Types: 2 Shots of liquor per week    Comment: drinks on weekends, gin/vodka 1/5th.  . Drug use: No    Allergies  Allergen Reactions  . Aspirin Other (See Comments)    REACTION: Diverticular Bleed    Current Outpatient Medications  Medication Sig Dispense Refill  . amLODipine (NORVASC) 5 MG tablet Take 5 mg by mouth daily.     Marland Kitchen atorvastatin (LIPITOR) 20 MG tablet Take 1 tablet (20 mg total) by mouth daily. 90 tablet 3  . clindamycin (CLINDAGEL) 1 % gel Apply topically 2 (two) times daily. (Patient taking differently: Apply 1 application topically daily as needed (to area(s) on the back of neck). ) 30 g 11  . colchicine 0.6 MG tablet TAKE 1 TABLET BY MOUTH 3 TIMES DAILY X5 DAYS FOR GOUT PAIN. 15 tablet 5  . gabapentin (NEURONTIN) 300 MG capsule Take  300 mg by mouth 2 (two) times daily.     Marland Kitchen HYDROcodone-acetaminophen (NORCO/VICODIN) 5-325 MG tablet One tablet every six hours as needed for acute pain.  Must last 14 days. 55 tablet 0  . iron polysaccharides (NIFEREX) 150 MG capsule Take 1 capsule (150 mg total) by mouth daily. 30 capsule 6  . levothyroxine (SYNTHROID, LEVOTHROID) 200 MCG tablet TAKE 1 TABLET DAILY BEFORE BREAKFAST. 90 tablet 3  . metoprolol succinate (TOPROL-XL) 25 MG 24 hr tablet Take 1.5 tablets (37.5 mg total) by mouth daily. 45 tablet 6  .  omeprazole (PRILOSEC) 20 MG capsule TAKE 1 CAPSULE BY MOUTH ONCE DAILY. 30 capsule 3  . tamsulosin (FLOMAX) 0.4 MG CAPS capsule TAKE ONE CAPSULE BY MOUTH ONCE DAILY. 90 capsule 0  . ULORIC 40 MG tablet TAKE 1 TABLET BY MOUTH ONCE A DAY. 90 tablet 0   No current facility-administered medications for this visit.      Physical Exam  Blood pressure (!) 144/95, pulse 99, height 6\' 1"  (1.854 m), weight 244 lb (110.7 kg).  Constitutional: overall normal hygiene, normal nutrition, well developed, normal grooming, normal body habitus. Assistive device:none  Musculoskeletal: gait and station Limp none, muscle tone and strength are normal, no tremors or atrophy is present.  .  Neurological: coordination overall normal.  Deep tendon reflex/nerve stretch intact.  Sensation normal.  Cranial nerves II-XII intact.   Skin:   Normal overall no scars, lesions, ulcers or rashes. No psoriasis.  Psychiatric: Alert and oriented x 3.  Recent memory intact, remote memory unclear.  Normal mood and affect. Well groomed.  Good eye contact.  Cardiovascular: overall no swelling, no varicosities, no edema bilaterally, normal temperatures of the legs and arms, no clubbing, cyanosis and good capillary refill.  Lymphatic: palpation is normal.  His wrists have slight tenderness, more on the left but no swelling and no redness.  NV intact.  All other systems reviewed and are negative   The patient has been  educated about the nature of the problem(s) and counseled on treatment options.  The patient appeared to understand what I have discussed and is in agreement with it.  Encounter Diagnoses  Name Primary?  . Acute idiopathic gout of left hand Yes  . Chronic pain disorder   . Status post bilateral knee replacements     PLAN Call if any problems.  Precautions discussed.  Continue current medications.   Return to clinic 2 months   I have reviewed the [place] web site prior to prescribing narcotic medicine for this patient.  Electronically Signed Sanjuana Kava, MD 5/14/20198:21 AM

## 2018-01-19 DIAGNOSIS — E785 Hyperlipidemia, unspecified: Secondary | ICD-10-CM | POA: Diagnosis not present

## 2018-01-19 DIAGNOSIS — E039 Hypothyroidism, unspecified: Secondary | ICD-10-CM | POA: Diagnosis not present

## 2018-01-19 DIAGNOSIS — I10 Essential (primary) hypertension: Secondary | ICD-10-CM | POA: Diagnosis not present

## 2018-01-20 DIAGNOSIS — E039 Hypothyroidism, unspecified: Secondary | ICD-10-CM | POA: Diagnosis not present

## 2018-01-20 DIAGNOSIS — I10 Essential (primary) hypertension: Secondary | ICD-10-CM | POA: Diagnosis not present

## 2018-01-20 DIAGNOSIS — E785 Hyperlipidemia, unspecified: Secondary | ICD-10-CM | POA: Diagnosis not present

## 2018-01-20 DIAGNOSIS — Z79899 Other long term (current) drug therapy: Secondary | ICD-10-CM | POA: Diagnosis not present

## 2018-01-20 DIAGNOSIS — D509 Iron deficiency anemia, unspecified: Secondary | ICD-10-CM | POA: Diagnosis not present

## 2018-01-27 DIAGNOSIS — Z96652 Presence of left artificial knee joint: Secondary | ICD-10-CM | POA: Insufficient documentation

## 2018-01-31 ENCOUNTER — Ambulatory Visit (INDEPENDENT_AMBULATORY_CARE_PROVIDER_SITE_OTHER): Payer: Medicare Other

## 2018-01-31 ENCOUNTER — Ambulatory Visit (INDEPENDENT_AMBULATORY_CARE_PROVIDER_SITE_OTHER): Payer: Medicare Other | Admitting: Orthopedic Surgery

## 2018-01-31 ENCOUNTER — Telehealth: Payer: Self-pay | Admitting: *Deleted

## 2018-01-31 VITALS — BP 144/97 | HR 86 | Ht 72.5 in | Wt 245.0 lb

## 2018-01-31 DIAGNOSIS — Z96652 Presence of left artificial knee joint: Secondary | ICD-10-CM

## 2018-01-31 DIAGNOSIS — Z96651 Presence of right artificial knee joint: Secondary | ICD-10-CM | POA: Diagnosis not present

## 2018-01-31 NOTE — Telephone Encounter (Signed)
Patient request refill on Hydrocodone 5/325 mg.

## 2018-01-31 NOTE — Progress Notes (Signed)
ANNUAL FOLLOW UP FOR  BILATERAL  TKA   Chief Complaint  Patient presents with  . Follow-up    Recheck on bilateral knee replacement, Left TKA 06-29-06, right TKA 09-18-08.     HPI: The patient is here for the annual  follow-up x-ray for knee replacement. The patient is not complaining of pain weakness instability or stiffness in the repaired knee.   Review of Systems  Musculoskeletal:       Joint ache and swelling    Past Medical History:  Diagnosis Date  . Anemia    FeDA: GASTRIC POLYPS, B12; TCS 2008 EGD 2009, 2008-HB 11.1 MCV 83.6 CR 1.22, 2009 FERRITIN 102-22  . B12 deficiency   . Carcinoid tumor determined by biopsy of stomach 09/25/2014  . Chronic knee pain   . Diverticulosis of colon    Lower GI bleed 2008  . Essential hypertension   . GERD (gastroesophageal reflux disease)   . Gout   . Hepatomegaly    Hepatic steatosis  . History of alcohol abuse   . History of septic arthritis   . Hypothyroidism   . PUD   . Substance abuse ([place])      Examination of the left KNEE  BP (!) 144/97   Pulse 86   Ht 6' 0.5" (1.842 m)   Wt 245 lb (111.1 kg)   BMI 32.77 kg/m   General the patient is normally groomed in no distress  Mood normal Affect pleasant   The patient is Awake and alert ; oriented normal   Inspection shows : incision healed nicely without erythema, no tenderness no swelling, second incision from prior surgery from  Range of motion total range of motion is 110  Stability the knee is stable anterior to posterior as well as medial to lateral  Strength quadriceps strength is normal  Skin no erythema around the skin incision  Cardiovascular NO EDEMA   Neuro: normal sensation in the operative leg   Right knee flexion is a little bit better at 120 full extension Medical decision-making section  X-rays ordered with the following personal interpretation  Normal alignment without loosening of both knees see x-ray report  Diagnosis  Encounter  Diagnoses  Name Primary?  . S/P total knee replacement, left 06/29/06   . S/P TKR (total knee replacement), right 09/18/08 Yes     Plan follow-up 1 year repeat x-rays

## 2018-02-01 MED ORDER — HYDROCODONE-ACETAMINOPHEN 5-325 MG PO TABS: ORAL_TABLET | ORAL | 0 refills | Status: DC

## 2018-02-15 DIAGNOSIS — R109 Unspecified abdominal pain: Secondary | ICD-10-CM | POA: Diagnosis not present

## 2018-02-21 ENCOUNTER — Telehealth: Payer: Self-pay | Admitting: Orthopaedic Surgery

## 2018-02-21 NOTE — Telephone Encounter (Signed)
Hydrocodone-Acetaminophen 5/325 mg  Qty 45 Tablets ° °PATIENT USES [facility] °

## 2018-02-22 MED ORDER — HYDROCODONE-ACETAMINOPHEN 5-325 MG PO TABS: ORAL_TABLET | ORAL | 0 refills | Status: DC

## 2018-03-01 ENCOUNTER — Other Ambulatory Visit: Payer: Self-pay | Admitting: Family Medicine

## 2018-03-02 ENCOUNTER — Other Ambulatory Visit: Payer: Self-pay | Admitting: Family Medicine

## 2018-03-08 ENCOUNTER — Other Ambulatory Visit (HOSPITAL_COMMUNITY): Payer: Self-pay | Admitting: *Deleted

## 2018-03-08 DIAGNOSIS — D500 Iron deficiency anemia secondary to blood loss (chronic): Secondary | ICD-10-CM

## 2018-03-08 MED ORDER — POLYSACCHARIDE IRON COMPLEX 150 MG PO CAPS: 150.0000 mg | ORAL_CAPSULE | Freq: Every day | ORAL | 6 refills | Status: DC

## 2018-03-10 MED FILL — FERREX 150 FORTE CAPSULE: 150-1-25 | 30 days supply | Qty: 30 | Fill #0

## 2018-03-16 ENCOUNTER — Telehealth: Payer: Self-pay | Admitting: Orthopaedic Surgery

## 2018-03-16 MED ORDER — HYDROCODONE-ACETAMINOPHEN 5-325 MG PO TABS: ORAL_TABLET | ORAL | 0 refills | Status: DC

## 2018-03-16 NOTE — Telephone Encounter (Signed)
Patient called for refill:  HYDROcodone-acetaminophen (NORCO/VICODIN) 5-325 MG tablet 45 tablet  - pharmacy is [facility].

## 2018-03-29 ENCOUNTER — Other Ambulatory Visit: Payer: Self-pay | Admitting: Orthopedic Surgery

## 2018-04-06 ENCOUNTER — Other Ambulatory Visit: Payer: Self-pay | Admitting: Family Medicine

## 2018-04-08 ENCOUNTER — Other Ambulatory Visit: Payer: Self-pay | Admitting: Family Medicine

## 2018-04-12 ENCOUNTER — Ambulatory Visit (INDEPENDENT_AMBULATORY_CARE_PROVIDER_SITE_OTHER): Payer: Medicare Other | Admitting: Orthopaedic Surgery

## 2018-04-12 ENCOUNTER — Encounter: Payer: Self-pay | Admitting: Orthopaedic Surgery

## 2018-04-12 VITALS — BP 166/108 | HR 87 | Ht 72.5 in | Wt 253.0 lb

## 2018-04-12 DIAGNOSIS — G894 Chronic pain syndrome: Secondary | ICD-10-CM | POA: Diagnosis not present

## 2018-04-12 DIAGNOSIS — R350 Frequency of micturition: Secondary | ICD-10-CM | POA: Diagnosis not present

## 2018-04-12 DIAGNOSIS — M1A0410 Idiopathic chronic gout, right hand, without tophus (tophi): Secondary | ICD-10-CM

## 2018-04-12 DIAGNOSIS — R109 Unspecified abdominal pain: Secondary | ICD-10-CM | POA: Diagnosis not present

## 2018-04-12 DIAGNOSIS — N401 Enlarged prostate with lower urinary tract symptoms: Secondary | ICD-10-CM | POA: Diagnosis not present

## 2018-04-12 MED ORDER — HYDROCODONE-ACETAMINOPHEN 5-325 MG PO TABS: ORAL_TABLET | ORAL | 0 refills | Status: DC

## 2018-04-12 NOTE — Progress Notes (Signed)
Patient NO:MVEHM Pelot, male DOB:08-26-1951, 67 y.o. CNO:[id number]  Chief Complaint  Patient presents with  . Wrist Pain    left wrist pain-gout    HPI  [name] is a 67 y.o. male who has chronic pain and chronic gout affecting mainly the right hand.  He has been taking his medicine and has had less swelling of the right hand.  He does not have tophus formation.  He has no new trauma.  He has no redness.   Body mass index is 33.84 kg/m.  ROS  Review of Systems  HENT: Negative for congestion.   Respiratory: Negative for cough and shortness of breath.   Cardiovascular: Negative for chest pain and leg swelling.  Endocrine: Positive for cold intolerance.  Musculoskeletal: Positive for arthralgias and joint swelling.  Allergic/Immunologic: Positive for environmental allergies.  All other systems reviewed and are negative.   All other systems reviewed and are negative.  Past Medical History:  Diagnosis Date  . Anemia    FeDA: GASTRIC POLYPS, B12; TCS 2008 EGD 2009, 2008-HB 11.1 MCV 83.6 CR 1.22, 2009 FERRITIN 102-22  . B12 deficiency   . Carcinoid tumor determined by biopsy of stomach 09/25/2014  . Chronic knee pain   . Diverticulosis of colon    Lower GI bleed 2008  . Essential hypertension   . GERD (gastroesophageal reflux disease)   . Gout   . Hepatomegaly    Hepatic steatosis  . History of alcohol abuse   . History of septic arthritis   . Hypothyroidism   . PUD   . Substance abuse [facility])     Past Surgical History:  Procedure Laterality Date  . BIOPSY  08/14/2014   Procedure: BIOPSY;  Surgeon: Danie Binder, MD;  Location: [facility];  Service: Endoscopy;;  . BIOPSY  03/12/2015   Procedure: BIOPSY;  Surgeon: Danie Binder, MD;  Location: [facility];  Service: Endoscopy;;  . BIOPSY  04/21/2016   Procedure: BIOPSY;  Surgeon: Danie Binder, MD;  [facility];  Service: Endoscopy;;  bx's of antrum, body of stomach, fundus, and cardia  . CHOLECYSTECTOMY    .  COLONOSCOPY  2008 [facility] DJ   LGIB 2o to TICS, prep good  . COLONOSCOPY WITH PROPOFOL N/A 08/14/2014   SLF: 1. Four large colon polyps removed. 2. The left colon is redundant 3. Moderate diverticulosis throughout teh entire examined colon  . COLONOSCOPY WITH PROPOFOL N/A 11/06/2014   SLF: 8 small polyps removed. One large pedunculated polyp removed from the ascending colon, tubulovillous and tubular adenomas. Next colonoscopy March 2019  . COLONOSCOPY WITH PROPOFOL N/A 01/04/2018   Procedure: COLONOSCOPY WITH PROPOFOL;  Surgeon: Danie Binder, MD;  [facility];  Service: Endoscopy;  Laterality: N/A;  10:00am  . ESOPHAGOGASTRODUODENOSCOPY  12/08/2007   QHU:TMLY gastric polyps seen in the cardia and body of the stomach/Normal esophagus without evidence of Barrett's, mass, erosion/Normal duodenal bulb and second portion of the duodenum. Benign bx.  . ESOPHAGOGASTRODUODENOSCOPY (EGD) WITH PROPOFOL N/A 08/14/2014   SLF: 1. Heme postive stools due to gastric and colon polyps 2. Multiple gastric   . ESOPHAGOGASTRODUODENOSCOPY (EGD) WITH PROPOFOL N/A 03/12/2015   SLF: Multiple gastric nodules seen in gasric body/antrum. 2. Non-erosive gastritis ( inflammation) was found in the gastric antrum.   . ESOPHAGOGASTRODUODENOSCOPY (EGD) WITH PROPOFOL N/A 10/08/2015   Procedure: ESOPHAGOGASTRODUODENOSCOPY (EGD) WITH PROPOFOL;  Surgeon: Danie Binder, MD;  [facility];  Service: Endoscopy;  Laterality: N/A;  0945  .  ESOPHAGOGASTRODUODENOSCOPY (EGD) WITH PROPOFOL N/A 04/21/2016   Procedure: ESOPHAGOGASTRODUODENOSCOPY (EGD) WITH PROPOFOL;  Surgeon: Danie Binder, MD;  [facility];  Service: Endoscopy;  Laterality: N/A;  730   . HAND SURGERY Right    fracture repair with plates  . JOINT REPLACEMENT Bilateral    knees  . POLYPECTOMY  08/14/2014   Procedure: POLYPECTOMY;  Surgeon: Danie Binder, MD;  Location: [facility];  Service: Endoscopy;;  . POLYPECTOMY N/A 11/06/2014   Procedure:  POLYPECTOMY;  Surgeon: Danie Binder, MD;  Location: [facility];  Service: Endoscopy;  Laterality: N/A;  Transverse Colon x3, Ascending Colon x2, Descending Colon x3  . REPLACEMENT TOTAL KNEE BILATERAL    . UPPER GASTROINTESTINAL ENDOSCOPY  APR 2009   INFLAMED HYPERPLASTIC POLYPS, CHRONIC GASTRITIS    Family History  Problem Relation Age of Onset  . Diabetes Mother   . Renal Disease Mother        failure  . Diabetes Father        'old age'  . Hypertension Sister   . Hypertension Brother   . Diabetes Brother   . Diabetes Sister   . Diabetes Sister   . Diabetes Brother   . Hypertension Brother   . Diabetes Brother        right BKA  . Kidney failure Brother        on dialysis  . Kidney failure Brother        on dialysis  . Diabetes Brother        bilateral BKA  . Colon polyps Neg Hx   . Colon cancer Neg Hx   . Pancreatic disease Neg Hx     Social History Social History   Tobacco Use  . Smoking status: Never Smoker  . Smokeless tobacco: Never Used  . Tobacco comment: Quit x 10 years/ never smoked on regular basis  Substance Use Topics  . Alcohol use: Yes    Alcohol/week: 2.0 standard drinks    Types: 2 Shots of liquor per week    Comment: drinks on weekends, gin/vodka 1/5th.  . Drug use: No    Allergies  Allergen Reactions  . Aspirin Other (See Comments)    REACTION: Diverticular Bleed    Current Outpatient Medications  Medication Sig Dispense Refill  . amLODipine (NORVASC) 5 MG tablet Take 5 mg by mouth daily.     Marland Kitchen atorvastatin (LIPITOR) 20 MG tablet Take 1 tablet (20 mg total) by mouth daily. 90 tablet 3  . clindamycin (CLINDAGEL) 1 % gel Apply topically 2 (two) times daily. (Patient taking differently: Apply 1 application topically daily as needed (to area(s) on the back of neck). ) 30 g 11  . colchicine 0.6 MG tablet TAKE 1 TABLET BY MOUTH 3 TIMES DAILY X5 DAYS FOR GOUT PAIN. 15 tablet 0  . gabapentin (NEURONTIN) 300 MG capsule Take 300 mg by mouth 2 (two)  times daily.     Marland Kitchen HYDROcodone-acetaminophen (NORCO/VICODIN) 5-325 MG tablet One tablet every six hours as needed for acute pain.  Must last 14 days. 45 tablet 0  . iron polysaccharides (NIFEREX) 150 MG capsule Take 1 capsule (150 mg total) by mouth daily. 30 capsule 6  . levothyroxine (SYNTHROID, LEVOTHROID) 200 MCG tablet TAKE 1 TABLET DAILY BEFORE BREAKFAST. 90 tablet 3  . metoprolol succinate (TOPROL-XL) 25 MG 24 hr tablet Take 1.5 tablets (37.5 mg total) by mouth daily. 45 tablet 6  . omeprazole (PRILOSEC) 20 MG capsule TAKE 1 CAPSULE BY MOUTH  ONCE DAILY. 30 capsule 3  . tamsulosin (FLOMAX) 0.4 MG CAPS capsule TAKE ONE CAPSULE BY MOUTH ONCE DAILY. 90 capsule 0  . ULORIC 40 MG tablet TAKE 1 TABLET BY MOUTH ONCE A DAY. 90 tablet 0   No current facility-administered medications for this visit.      Physical Exam  Blood pressure (!) 166/108, pulse 87, height 6' 0.5" (1.842 m), weight 253 lb (114.8 kg).  Constitutional: overall normal hygiene, normal nutrition, well developed, normal grooming, normal body habitus. Assistive device:cane  Musculoskeletal: gait and station Limp right, muscle tone and strength are normal, no tremors or atrophy is present.  .  Neurological: coordination overall normal.  Deep tendon reflex/nerve stretch intact.  Sensation normal.  Cranial nerves II-XII intact.   Skin:   Normal overall no scars, lesions, ulcers or rashes. No psoriasis.  Psychiatric: Alert and oriented x 3.  Recent memory intact, remote memory unclear.  Normal mood and affect. Well groomed.  Good eye contact.  Cardiovascular: overall no swelling, no varicosities, no edema bilaterally, normal temperatures of the legs and arms, no clubbing, cyanosis and good capillary refill.  Lymphatic: palpation is normal.  He has slight swelling of the radial wrist on the right but no swelling in the fingers.  He has no redness.  ROM of the wrist and fingers is full today.  NV is intact.  All other systems  reviewed and are negative   The patient has been educated about the nature of the problem(s) and counseled on treatment options.  The patient appeared to understand what I have discussed and is in agreement with it.  Encounter Diagnoses  Name Primary?  . Chronic pain disorder Yes  . Idiopathic chronic gout of right hand without tophus     PLAN Call if any problems.  Precautions discussed.  Continue current medications.   Return to clinic 3 months   I have reviewed the [place] web site prior to prescribing narcotic medicine for this patient. Electronically Signed Sanjuana Kava, MD 8/13/20198:28 AM

## 2018-04-21 DIAGNOSIS — N390 Urinary tract infection, site not specified: Secondary | ICD-10-CM | POA: Diagnosis not present

## 2018-04-21 DIAGNOSIS — Z79899 Other long term (current) drug therapy: Secondary | ICD-10-CM | POA: Diagnosis not present

## 2018-04-21 DIAGNOSIS — I10 Essential (primary) hypertension: Secondary | ICD-10-CM | POA: Diagnosis not present

## 2018-04-22 ENCOUNTER — Other Ambulatory Visit (HOSPITAL_COMMUNITY): Payer: Self-pay | Admitting: Nurse Practitioner

## 2018-04-22 MED FILL — FERREX 150 FORTE CAPSULE: 150-1-25 | 30 days supply | Qty: 30 | Fill #1

## 2018-05-04 ENCOUNTER — Telehealth: Payer: Self-pay | Admitting: Orthopaedic Surgery

## 2018-05-04 MED ORDER — HYDROCODONE-ACETAMINOPHEN 5-325 MG PO TABS: ORAL_TABLET | ORAL | 0 refills | Status: DC

## 2018-05-04 NOTE — Telephone Encounter (Signed)
Hydrocodone-Acetaminophen 5/325 mg  Qty 45 Tablets  PATIENT USES [facility]

## 2018-05-16 ENCOUNTER — Other Ambulatory Visit (HOSPITAL_COMMUNITY): Payer: Self-pay | Admitting: *Deleted

## 2018-05-16 DIAGNOSIS — D500 Iron deficiency anemia secondary to blood loss (chronic): Secondary | ICD-10-CM

## 2018-05-16 MED ORDER — POLYSACCHARIDE IRON COMPLEX 150 MG PO CAPS: 150.0000 mg | ORAL_CAPSULE | Freq: Every day | ORAL | 1 refills | Status: DC

## 2018-05-16 NOTE — Telephone Encounter (Signed)
Patient called wanting 90 day supply on his Niferex and for it to be faxed to [facility] outpatient pharmacy.

## 2018-05-17 ENCOUNTER — Other Ambulatory Visit (HOSPITAL_COMMUNITY): Payer: Self-pay | Admitting: *Deleted

## 2018-05-17 MED ORDER — IRON POLYSACCH CMPLX-B12-FA 150-0.025-1 MG PO CAPS: 1.0000 | ORAL_CAPSULE | Freq: Every day | ORAL | 3 refills | Status: DC

## 2018-05-23 ENCOUNTER — Other Ambulatory Visit: Payer: Self-pay | Admitting: Cardiology

## 2018-05-30 ENCOUNTER — Telehealth: Payer: Self-pay | Admitting: Orthopaedic Surgery

## 2018-05-30 MED FILL — FERREX 150 FORTE CAPSULE: 150-1-25 | 90 days supply | Qty: 90 | Fill #0

## 2018-05-30 NOTE — Telephone Encounter (Signed)
Call from patient received for refill of: HYDROcodone-acetaminophen (NORCO/VICODIN) 5-325 MG tablet 45 tablet   - [facility]

## 2018-05-31 ENCOUNTER — Other Ambulatory Visit: Payer: Self-pay | Admitting: Orthopaedic Surgery

## 2018-05-31 MED ORDER — HYDROCODONE-ACETAMINOPHEN 5-325 MG PO TABS: ORAL_TABLET | ORAL | 0 refills | Status: DC

## 2018-06-08 ENCOUNTER — Other Ambulatory Visit: Payer: Self-pay

## 2018-06-08 NOTE — Patient Outreach (Signed)
[facility]) Care Management  06/08/2018  [name] 03-04-1951 [id number]   Medication adherence call to [name] spoke with patient he still has medication Atorvastatin 20 mg but, ask if we can contact his new doctor for more refill left a message at doctor's office. [name] is showing past due under [place].   [facility] Direct Dial [phone]  Fax [phone] [email or web address]

## 2018-06-29 ENCOUNTER — Telehealth: Payer: Self-pay | Admitting: Orthopaedic Surgery

## 2018-06-29 MED ORDER — HYDROCODONE-ACETAMINOPHEN 5-325 MG PO TABS: ORAL_TABLET | ORAL | 0 refills | Status: DC

## 2018-06-29 NOTE — Telephone Encounter (Signed)
Patient requests refill on Hydrocodone/Acetaminophen 5-325  Mgs.  Qty  62       Sig: One tablet every six hours as needed for acute pain. Must last 14 days   Patient states he uses [facility]

## 2018-07-13 ENCOUNTER — Encounter: Payer: Self-pay | Admitting: Orthopaedic Surgery

## 2018-07-13 ENCOUNTER — Ambulatory Visit (INDEPENDENT_AMBULATORY_CARE_PROVIDER_SITE_OTHER): Payer: Medicare Other | Admitting: Orthopaedic Surgery

## 2018-07-13 VITALS — BP 142/89 | HR 85 | Ht 72.0 in | Wt 256.0 lb

## 2018-07-13 DIAGNOSIS — I10 Essential (primary) hypertension: Secondary | ICD-10-CM | POA: Diagnosis not present

## 2018-07-13 DIAGNOSIS — M1A0410 Idiopathic chronic gout, right hand, without tophus (tophi): Secondary | ICD-10-CM | POA: Diagnosis not present

## 2018-07-13 DIAGNOSIS — E785 Hyperlipidemia, unspecified: Secondary | ICD-10-CM | POA: Diagnosis not present

## 2018-07-13 DIAGNOSIS — D509 Iron deficiency anemia, unspecified: Secondary | ICD-10-CM | POA: Diagnosis not present

## 2018-07-13 NOTE — Progress Notes (Signed)
Patient [name], male DOB:[birth year], 66 y.o. KKX:[id number]  Chief Complaint  Patient presents with  . Gout    HPI  Toussaint Golson is a 67 y.o. male who has chronic gout with involvement of the right hand more than other areas.  He is taking his medicine. He has had no recent attacks.  He has some pain secondary to cold weather.  He has no numbness.    Body mass index is 34.72 kg/m.  ROS  Review of Systems  HENT: Negative for congestion.   Respiratory: Negative for cough and shortness of breath.   Cardiovascular: Negative for chest pain and leg swelling.  Endocrine: Positive for cold intolerance.  Musculoskeletal: Positive for arthralgias and joint swelling.  Allergic/Immunologic: Positive for environmental allergies.  All other systems reviewed and are negative.   All other systems reviewed and are negative.  The following is a summary of the past history medically, past history surgically, known current medicines, social history and family history.  This information is gathered electronically by the computer from prior information and documentation.  I review this each visit and have found including this information at this point in the chart is beneficial and informative.    Past Medical History:  Diagnosis Date  . Anemia    FeDA: GASTRIC POLYPS, B12; TCS 2008 EGD 2009, 2008-HB 11.1 MCV 83.6 CR 1.22, 2009 FERRITIN 102-22  . B12 deficiency   . Carcinoid tumor determined by biopsy of stomach 09/25/2014  . Chronic knee pain   . Diverticulosis of colon    Lower GI bleed 2008  . Essential hypertension   . GERD (gastroesophageal reflux disease)   . Gout   . Hepatomegaly    Hepatic steatosis  . History of alcohol abuse   . History of septic arthritis   . Hypothyroidism   . PUD   . Substance abuse [facility])     Past Surgical History:  Procedure Laterality Date  . BIOPSY  08/14/2014   Procedure: BIOPSY;  Surgeon: Danie Binder, MD;  Location: [facility];  Service:  Endoscopy;;  . BIOPSY  03/12/2015   Procedure: BIOPSY;  Surgeon: Danie Binder, MD;  Location: [facility];  Service: Endoscopy;;  . BIOPSY  04/21/2016   Procedure: BIOPSY;  Surgeon: Danie Binder, MD;  Location: [facility];  Service: Endoscopy;;  bx's of antrum, body of stomach, fundus, and cardia  . CHOLECYSTECTOMY    . COLONOSCOPY  2008 [facility] DJ   LGIB 2o to TICS, prep good  . COLONOSCOPY WITH PROPOFOL N/A 08/14/2014   SLF: 1. Four large colon polyps removed. 2. The left colon is redundant 3. Moderate diverticulosis throughout teh entire examined colon  . COLONOSCOPY WITH PROPOFOL N/A 11/06/2014   SLF: 8 small polyps removed. One large pedunculated polyp removed from the ascending colon, tubulovillous and tubular adenomas. Next colonoscopy March 2019  . COLONOSCOPY WITH PROPOFOL N/A 01/04/2018   Procedure: COLONOSCOPY WITH PROPOFOL;  Surgeon: Danie Binder, MD;  Location: [facility];  Service: Endoscopy;  Laterality: N/A;  10:00am  . ESOPHAGOGASTRODUODENOSCOPY  12/08/2007   JIR:CVEL gastric polyps seen in the cardia and body of the stomach/Normal esophagus without evidence of Barrett's, mass, erosion/Normal duodenal bulb and second portion of the duodenum. Benign bx.  . ESOPHAGOGASTRODUODENOSCOPY (EGD) WITH PROPOFOL N/A 08/14/2014   SLF: 1. Heme postive stools due to gastric and colon polyps 2. Multiple gastric   . ESOPHAGOGASTRODUODENOSCOPY (EGD) WITH PROPOFOL N/A 03/12/2015   SLF: Multiple gastric nodules seen in  gasric body/antrum. 2. Non-erosive gastritis ( inflammation) was found in the gastric antrum.   . ESOPHAGOGASTRODUODENOSCOPY (EGD) WITH PROPOFOL N/A 10/08/2015   Procedure: ESOPHAGOGASTRODUODENOSCOPY (EGD) WITH PROPOFOL;  Surgeon: Danie Binder, MD;  [facility];  Service: Endoscopy;  Laterality: N/A;  0945  . ESOPHAGOGASTRODUODENOSCOPY (EGD) WITH PROPOFOL N/A 04/21/2016   Procedure: ESOPHAGOGASTRODUODENOSCOPY (EGD) WITH PROPOFOL;  Surgeon: Danie Binder, MD;  Location: [facility]  END[facility];  Service: Endoscopy;  Laterality: N/A;  730   . HAND SURGERY Right    fracture repair with plates  . JOINT REPLACEMENT Bilateral    knees  . POLYPECTOMY  08/14/2014   Procedure: POLYPECTOMY;  Surgeon: Danie Binder, MD;  Location: [facility];  Service: Endoscopy;;  . POLYPECTOMY N/A 11/06/2014   Procedure: POLYPECTOMY;  Surgeon: Danie Binder, MD;  Location: [facility];  Service: Endoscopy;  Laterality: N/A;  Transverse Colon x3, Ascending Colon x2, Descending Colon x3  . REPLACEMENT TOTAL KNEE BILATERAL    . UPPER GASTROINTESTINAL ENDOSCOPY  APR 2009   INFLAMED HYPERPLASTIC POLYPS, CHRONIC GASTRITIS    Family History  Problem Relation Age of Onset  . Diabetes Mother   . Renal Disease Mother        failure  . Diabetes Father        'old age'  . Hypertension Sister   . Hypertension Brother   . Diabetes Brother   . Diabetes Sister   . Diabetes Sister   . Diabetes Brother   . Hypertension Brother   . Diabetes Brother        right BKA  . Kidney failure Brother        on dialysis  . Kidney failure Brother        on dialysis  . Diabetes Brother        bilateral BKA  . Colon polyps Neg Hx   . Colon cancer Neg Hx   . Pancreatic disease Neg Hx     Social History Social History   Tobacco Use  . Smoking status: Never Smoker  . Smokeless tobacco: Never Used  . Tobacco comment: Quit x 10 years/ never smoked on regular basis  Substance Use Topics  . Alcohol use: Yes    Alcohol/week: 2.0 standard drinks    Types: 2 Shots of liquor per week    Comment: drinks on weekends, gin/vodka 1/5th.  . Drug use: No    Allergies  Allergen Reactions  . Aspirin Other (See Comments)    REACTION: Diverticular Bleed    Current Outpatient Medications  Medication Sig Dispense Refill  . amLODipine (NORVASC) 5 MG tablet TAKE 1 TABLET BY MOUTH ONCE DAILY. 90 tablet 0  . atorvastatin (LIPITOR) 20 MG tablet Take 1 tablet (20 mg total) by mouth daily. 90 tablet 3  . clindamycin  (CLINDAGEL) 1 % gel Apply topically 2 (two) times daily. (Patient taking differently: Apply 1 application topically daily as needed (to area(s) on the back of neck). ) 30 g 11  . colchicine 0.6 MG tablet TAKE 1 TABLET BY MOUTH 3 TIMES DAILY X5 DAYS FOR GOUT PAIN. 15 tablet 1  . gabapentin (NEURONTIN) 300 MG capsule Take 300 mg by mouth 2 (two) times daily.     Marland Kitchen HYDROcodone-acetaminophen (NORCO/VICODIN) 5-325 MG tablet One tablet every six hours as needed for acute pain.  Must last 14 days. 40 tablet 0  . Iron Polysacch Cmplx-B12-FA 150-0.025-1 MG CAPS Take 1 tablet by mouth daily. 90 each 3  . levothyroxine (  SYNTHROID, LEVOTHROID) 200 MCG tablet TAKE 1 TABLET DAILY BEFORE BREAKFAST. 90 tablet 3  . metoprolol succinate (TOPROL-XL) 25 MG 24 hr tablet Take 1.5 tablets (37.5 mg total) by mouth daily. 45 tablet 6  . omeprazole (PRILOSEC) 20 MG capsule TAKE 1 CAPSULE BY MOUTH ONCE DAILY. 30 capsule 3  . tamsulosin (FLOMAX) 0.4 MG CAPS capsule TAKE ONE CAPSULE BY MOUTH ONCE DAILY. 90 capsule 0  . ULORIC 40 MG tablet TAKE 1 TABLET BY MOUTH ONCE A DAY. 90 tablet 0   No current facility-administered medications for this visit.      Physical Exam  Blood pressure (!) 142/89, pulse 85, height 6' (1.829 m), weight 256 lb (116.1 kg).  Constitutional: overall normal hygiene, normal nutrition, well developed, normal grooming, normal body habitus. Assistive device:cane  Musculoskeletal: gait and station Limp right, muscle tone and strength are normal, no tremors or atrophy is present.  .  Neurological: coordination overall normal.  Deep tendon reflex/nerve stretch intact.  Sensation normal.  Cranial nerves II-XII intact.   Skin:   Normal overall no scars, lesions, ulcers or rashes. No psoriasis.  Psychiatric: Alert and oriented x 3.  Recent memory intact, remote memory unclear.  Normal mood and affect. Well groomed.  Good eye contact.  Cardiovascular: overall no swelling, no varicosities, no edema  bilaterally, normal temperatures of the legs and arms, no clubbing, cyanosis and good capillary refill.  Lymphatic: palpation is normal.  Right hand has some tenderness.  He has some swelling of the radial side of the wrist. ROM is good.  NV intact.  All other systems reviewed and are negative   The patient has been educated about the nature of the problem(s) and counseled on treatment options.  The patient appeared to understand what I have discussed and is in agreement with it.  Encounter Diagnosis  Name Primary?  . 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  Precautions discussed.  Continue current medications.   Return to clinic 3 months   Electronically Signed Sanjuana Kava, MD 11/13/20198:53 AM

## 2018-07-18 ENCOUNTER — Other Ambulatory Visit: Payer: Self-pay | Admitting: Family Medicine

## 2018-07-20 DIAGNOSIS — I10 Essential (primary) hypertension: Secondary | ICD-10-CM | POA: Diagnosis not present

## 2018-07-20 DIAGNOSIS — K4090 Unilateral inguinal hernia, without obstruction or gangrene, not specified as recurrent: Secondary | ICD-10-CM | POA: Diagnosis not present

## 2018-07-20 DIAGNOSIS — Z0001 Encounter for general adult medical examination with abnormal findings: Secondary | ICD-10-CM | POA: Diagnosis not present

## 2018-07-25 ENCOUNTER — Other Ambulatory Visit: Payer: Self-pay | Admitting: Orthopaedic Surgery

## 2018-08-01 ENCOUNTER — Telehealth: Payer: Self-pay | Admitting: Orthopaedic Surgery

## 2018-08-01 NOTE — Telephone Encounter (Signed)
Hydrocodone-Acetaminophen 5/325 mg  Qty 40 Tablets  PATIENT USES [facility]

## 2018-08-02 MED ORDER — HYDROCODONE-ACETAMINOPHEN 5-325 MG PO TABS: ORAL_TABLET | ORAL | 0 refills | Status: DC

## 2018-08-10 DIAGNOSIS — K4090 Unilateral inguinal hernia, without obstruction or gangrene, not specified as recurrent: Secondary | ICD-10-CM | POA: Diagnosis not present

## 2018-08-22 ENCOUNTER — Other Ambulatory Visit: Payer: Self-pay | Admitting: Orthopaedic Surgery

## 2018-08-22 MED ORDER — HYDROCODONE-ACETAMINOPHEN 5-325 MG PO TABS: ORAL_TABLET | ORAL | 0 refills | Status: DC

## 2018-08-22 NOTE — Telephone Encounter (Signed)
HYDROcodone-acetaminophen (NORCO/VICODIN) 5-325 MG tablet 40 tablet   [facility]   - patient called for Dr Luna Glasgow, who is out of clinic until 08/30/18  - please review refill request

## 2018-08-25 ENCOUNTER — Emergency Department (HOSPITAL_COMMUNITY)
Admission: EM | Admit: 2018-08-25 | Discharge: 2018-08-25 | Disposition: A | Discharge status: Home / Self Care | Payer: Medicare Other | Attending: Emergency Medicine | Admitting: Emergency Medicine

## 2018-08-25 ENCOUNTER — Other Ambulatory Visit: Payer: Self-pay

## 2018-08-25 ENCOUNTER — Encounter (HOSPITAL_COMMUNITY): Payer: Self-pay | Admitting: Emergency Medicine

## 2018-08-25 ENCOUNTER — Emergency Department (HOSPITAL_COMMUNITY): Payer: Medicare Other

## 2018-08-25 DIAGNOSIS — Y908 Blood alcohol level of 240 mg/100 ml or more: Secondary | ICD-10-CM | POA: Insufficient documentation

## 2018-08-25 DIAGNOSIS — F1010 Alcohol abuse, uncomplicated: Secondary | ICD-10-CM | POA: Insufficient documentation

## 2018-08-25 DIAGNOSIS — E039 Hypothyroidism, unspecified: Secondary | ICD-10-CM | POA: Diagnosis not present

## 2018-08-25 DIAGNOSIS — Z79899 Other long term (current) drug therapy: Secondary | ICD-10-CM | POA: Insufficient documentation

## 2018-08-25 DIAGNOSIS — I10 Essential (primary) hypertension: Secondary | ICD-10-CM | POA: Diagnosis not present

## 2018-08-25 DIAGNOSIS — R42 Dizziness and giddiness: Secondary | ICD-10-CM | POA: Diagnosis not present

## 2018-08-25 DIAGNOSIS — R000 Tachycardia, unspecified: Secondary | ICD-10-CM | POA: Diagnosis not present

## 2018-08-25 DIAGNOSIS — N3001 Acute cystitis with hematuria: Secondary | ICD-10-CM | POA: Diagnosis not present

## 2018-08-25 LAB — URINALYSIS, ROUTINE W REFLEX MICROSCOPIC
BILIRUBIN URINE: NEGATIVE
Glucose, UA: NEGATIVE mg/dL
HGB URINE DIPSTICK: NEGATIVE
Ketones, ur: NEGATIVE mg/dL
NITRITE: NEGATIVE
PH: 5 (ref 5.0–8.0)
Protein, ur: NEGATIVE mg/dL
Specific Gravity, Urine: 1.01 (ref 1.005–1.030)

## 2018-08-25 LAB — COMPREHENSIVE METABOLIC PANEL
ALT: 24 U/L (ref 0–44)
ANION GAP: 10 (ref 5–15)
AST: 30 U/L (ref 15–41)
Albumin: 3.8 g/dL (ref 3.5–5.0)
Alkaline Phosphatase: 54 U/L (ref 38–126)
BILIRUBIN TOTAL: 0.8 mg/dL (ref 0.3–1.2)
BUN: 18 mg/dL (ref 8–23)
CO2: 23 mmol/L (ref 22–32)
Calcium: 8.8 mg/dL — ABNORMAL LOW (ref 8.9–10.3)
Chloride: 106 mmol/L (ref 98–111)
Creatinine, Ser: 1.26 mg/dL — ABNORMAL HIGH (ref 0.61–1.24)
GFR calc Af Amer: >60 mL/min (ref >60)
GFR calc non Af Amer: 58 mL/min — ABNORMAL LOW (ref >60)
GLUCOSE: 86 mg/dL (ref 70–99)
POTASSIUM: 3.6 mmol/L (ref 3.5–5.1)
SODIUM: 139 mmol/L (ref 135–145)
TOTAL PROTEIN: 7.7 g/dL (ref 6.5–8.1)

## 2018-08-25 LAB — CBC WITH DIFFERENTIAL/PLATELET
Abs Immature Granulocytes: 0.02 10*3/uL (ref 0.00–0.07)
BASOS ABS: 0.1 10*3/uL (ref 0.0–0.1)
BASOS PCT: 1 %
EOS ABS: 0.4 10*3/uL (ref 0.0–0.5)
EOS PCT: 6 %
HCT: 40.7 % (ref 39.0–52.0)
Hemoglobin: 13.1 g/dL (ref 13.0–17.0)
Immature Granulocytes: 0 %
Lymphocytes Relative: 45 %
Lymphs Abs: 3.2 10*3/uL (ref 0.7–4.0)
MCH: 30.5 pg (ref 26.0–34.0)
MCHC: 32.2 g/dL (ref 30.0–36.0)
MCV: 94.7 fL (ref 80.0–100.0)
Monocytes Absolute: 0.4 10*3/uL (ref 0.1–1.0)
Monocytes Relative: 6 %
NRBC: 0 % (ref 0.0–0.2)
Neutro Abs: 2.9 10*3/uL (ref 1.7–7.7)
Neutrophils Relative %: 42 %
PLATELETS: 148 10*3/uL — AB (ref 150–400)
RBC: 4.3 MIL/uL (ref 4.22–5.81)
RDW: 13.2 % (ref 11.5–15.5)
WBC: 7 10*3/uL (ref 4.0–10.5)

## 2018-08-25 LAB — TROPONIN I: Troponin I: <0.03 ng/mL (ref <0.03)

## 2018-08-25 LAB — ETHANOL: ALCOHOL ETHYL (B): 247 mg/dL — AB (ref <10)

## 2018-08-25 MED ORDER — SODIUM CHLORIDE 0.9 % IV BOLUS
500.0000 mL | Freq: Once | INTRAVENOUS | Status: AC
  Administered 2018-08-25: 500 mL via INTRAVENOUS

## 2018-08-25 MED ORDER — SODIUM CHLORIDE 0.9 % IV SOLN
1.0000 g | Freq: Once | INTRAVENOUS | Status: AC
  Administered 2018-08-25: 1 g via INTRAVENOUS
  Filled 2018-08-25: qty 10

## 2018-08-25 MED ORDER — CEPHALEXIN 500 MG PO CAPS: 500.0000 mg | ORAL_CAPSULE | Freq: Three times a day (TID) | ORAL | 0 refills | Status: DC

## 2018-08-25 MED ORDER — SODIUM CHLORIDE 0.9 % IV SOLN
INTRAVENOUS | Status: DC | PRN
  Administered 2018-08-25: 250 mL via INTRAVENOUS

## 2018-08-25 NOTE — ED Notes (Signed)
Pt ambulatory to waiting room. Pt verbalized understanding of discharge instructions.   

## 2018-08-25 NOTE — Discharge Instructions (Addendum)
Follow-up with your doctor next week for recheck.  Stop drinking alcohol

## 2018-08-25 NOTE — ED Provider Notes (Signed)
[facility] EMERGENCY DEPARTMENT Provider Note   CSN: [id number] Arrival date & time: 08/25/18  1541     History   Chief Complaint Chief Complaint  Patient presents with  . Dizziness    HPI [name] is a 67 y.o. male.  Patient complains of dizziness.  He states he feels dizzy when he stands up but not when he is laying down.  The room is not spinning  The history is provided by the patient. No language interpreter was used.  Dizziness  Quality:  Lightheadedness Severity:  Moderate Onset quality:  Gradual Timing:  Intermittent Progression:  Waxing and waning Chronicity:  Recurrent Context: not when bending over   Associated symptoms: no chest pain, no diarrhea and no headaches     Past Medical History:  Diagnosis Date  . Anemia    FeDA: GASTRIC POLYPS, B12; TCS 2008 EGD 2009, 2008-HB 11.1 MCV 83.6 CR 1.22, 2009 FERRITIN 102-22  . B12 deficiency   . Carcinoid tumor determined by biopsy of stomach 09/25/2014  . Chronic knee pain   . Diverticulosis of colon    Lower GI bleed 2008  . Essential hypertension   . GERD (gastroesophageal reflux disease)   . Gout   . Hepatomegaly    Hepatic steatosis  . History of alcohol abuse   . History of septic arthritis   . Hypothyroidism   . PUD   . Substance abuse [facility])     Patient Active Problem List   Diagnosis Date Noted  . S/P total knee replacement, left 06/29/06 01/27/2018  . Hyperlipidemia 10/23/2016  . Mild intellectual disabilities (CODE) 09/29/2016  . Personal history of gout 09/22/2016  . Literacy level of illiterate 09/22/2016  . S/P TKR (total knee replacement), right 09/18/08 09/22/2016  . Carcinoid tumor determined by biopsy of stomach 04/10/2016  . Tubulovillous adenoma of colon 09/12/2015  . Neuroendocrine tumor 09/25/2014  . Elevated LFTs 07/17/2014  . Iron deficiency anemia 07/29/2010  . Fatty liver 07/29/2010  . ABUSE, ALCOHOL, UNSPECIFIED 11/30/2008  . Osteoarthrosis involving lower leg  08/20/2008  . Essential hypertension 10/20/2006  . B12 deficiency 08/15/2006  . GERD 08/15/2006  . IBS 08/15/2006  . BPH without obstruction/lower urinary tract symptoms 08/15/2006  . OSTEOARTHRITIS 08/15/2006  . DEGENERATION, DISC NOS 08/15/2006    Past Surgical History:  Procedure Laterality Date  . BIOPSY  08/14/2014   Procedure: BIOPSY;  Surgeon: Danie Binder, MD;  Location: [facility];  Service: Endoscopy;;  . BIOPSY  03/12/2015   Procedure: BIOPSY;  Surgeon: Danie Binder, MD;  Location: [facility];  Service: Endoscopy;;  . BIOPSY  04/21/2016   Procedure: BIOPSY;  Surgeon: Danie Binder, MD;  Location[facility];  Service: Endoscopy;;  bx's of antrum, body of stomach, fundus, and cardia  . CHOLECYSTECTOMY    . COLONOSCOPY  2008 [facility] DJ   LGIB 2o to TICS, prep good  . COLONOSCOPY WITH PROPOFOL N/A 08/14/2014   SLF: 1. Four large colon polyps removed. 2. The left colon is redundant 3. Moderate diverticulosis throughout teh entire examined colon  . COLONOSCOPY WITH PROPOFOL N/A 11/06/2014   SLF: 8 small polyps removed. One large pedunculated polyp removed from the ascending colon, tubulovillous and tubular adenomas. Next colonoscopy March 2019  . COLONOSCOPY WITH PROPOFOL N/A 01/04/2018   Procedure: COLONOSCOPY WITH PROPOFOL;  Surgeon: Danie Binder, MD;  [facility];  Service: Endoscopy;  Laterality: N/A;  10:00am  . ESOPHAGOGASTRODUODENOSCOPY  12/08/2007   ZDG:UYQI gastric  polyps seen in the cardia and body of the stomach/Normal esophagus without evidence of Barrett's, mass, erosion/Normal duodenal bulb and second portion of the duodenum. Benign bx.  . ESOPHAGOGASTRODUODENOSCOPY (EGD) WITH PROPOFOL N/A 08/14/2014   SLF: 1. Heme postive stools due to gastric and colon polyps 2. Multiple gastric   . ESOPHAGOGASTRODUODENOSCOPY (EGD) WITH PROPOFOL N/A 03/12/2015   SLF: Multiple gastric nodules seen in gasric body/antrum. 2. Non-erosive gastritis ( inflammation) was found in the  gastric antrum.   . ESOPHAGOGASTRODUODENOSCOPY (EGD) WITH PROPOFOL N/A 10/08/2015   Procedure: ESOPHAGOGASTRODUODENOSCOPY (EGD) WITH PROPOFOL;  Surgeon: Danie Binder, MD;  [facility];  Service: Endoscopy;  Laterality: N/A;  0945  . ESOPHAGOGASTRODUODENOSCOPY (EGD) WITH PROPOFOL N/A 04/21/2016   Procedure: ESOPHAGOGASTRODUODENOSCOPY (EGD) WITH PROPOFOL;  Surgeon: Danie Binder, MD;  [facility];  Service: Endoscopy;  Laterality: N/A;  730   . HAND SURGERY Right    fracture repair with plates  . JOINT REPLACEMENT Bilateral    knees  . POLYPECTOMY  08/14/2014   Procedure: POLYPECTOMY;  Surgeon: Danie Binder, MD;  Location: [facility];  Service: Endoscopy;;  . POLYPECTOMY N/A 11/06/2014   Procedure: POLYPECTOMY;  Surgeon: Danie Binder, MD;  Location: [facility];  Service: Endoscopy;  Laterality: N/A;  Transverse Colon x3, Ascending Colon x2, Descending Colon x3  . REPLACEMENT TOTAL KNEE BILATERAL    . UPPER GASTROINTESTINAL ENDOSCOPY  APR 2009   INFLAMED HYPERPLASTIC POLYPS, CHRONIC GASTRITIS        Home Medications    Prior to Admission medications   Medication Sig Start Date End Date Taking? Authorizing Provider  amLODipine (NORVASC) 5 MG tablet TAKE 1 TABLET BY MOUTH ONCE DAILY. 05/23/18  Yes Satira Sark, MD  atorvastatin (LIPITOR) 20 MG tablet Take 1 tablet (20 mg total) by mouth daily. 10/23/16  Yes Raylene Everts, MD  HYDROcodone-acetaminophen (NORCO/VICODIN) 5-325 MG tablet One tablet every six hours as needed for acute pain.  Must last 14 days. 08/22/18  Yes Carole Civil, MD  Iron Polysacch Cmplx-B12-FA 150-0.025-1 MG CAPS Take 1 tablet by mouth daily. 05/17/18  Yes Higgs, Mathis Dad, MD  levothyroxine (SYNTHROID, LEVOTHROID) 200 MCG tablet TAKE 1 TABLET DAILY BEFORE BREAKFAST. 06/08/17  Yes Fayrene Helper, MD  metoprolol succinate (TOPROL-XL) 25 MG 24 hr tablet Take 1.5 tablets (37.5 mg total) by mouth daily. 01/04/18  Yes Branch, Alphonse Guild, MD    omeprazole (PRILOSEC) 20 MG capsule TAKE 1 CAPSULE BY MOUTH ONCE DAILY. 11/19/17  Yes Carlis Stable, NP  tamsulosin (FLOMAX) 0.4 MG CAPS capsule TAKE ONE CAPSULE BY MOUTH ONCE DAILY. 11/08/17  Yes Raylene Everts, MD  ULORIC 40 MG tablet TAKE 1 TABLET BY MOUTH ONCE A DAY. 12/21/17  Yes Raylene Everts, MD  cephALEXin (KEFLEX) 500 MG capsule Take 1 capsule (500 mg total) by mouth 3 (three) times daily. 08/25/18   Milton Ferguson, MD  clindamycin (CLINDAGEL) 1 % gel Apply topically 2 (two) times daily. Patient not taking: Reported on 08/25/2018 07/20/17   Raylene Everts, MD  colchicine 0.6 MG tablet TAKE 1 TABLET BY MOUTH 3 TIMES DAILY X5 DAYS FOR GOUT PAIN. Patient not taking: Reported on 08/25/2018 07/25/18   Carole Civil, MD    Family History Family History  Problem Relation Age of Onset  . Diabetes Mother   . Renal Disease Mother        failure  . Diabetes Father        'old  age'  . Hypertension Sister   . Hypertension Brother   . Diabetes Brother   . Diabetes Sister   . Diabetes Sister   . Diabetes Brother   . Hypertension Brother   . Diabetes Brother        right BKA  . Kidney failure Brother        on dialysis  . Kidney failure Brother        on dialysis  . Diabetes Brother        bilateral BKA  . Colon polyps Neg Hx   . Colon cancer Neg Hx   . Pancreatic disease Neg Hx     Social History Social History   Tobacco Use  . Smoking status: Never Smoker  . Smokeless tobacco: Never Used  . Tobacco comment: Quit x 10 years/ never smoked on regular basis  Substance Use Topics  . Alcohol use: Yes    Alcohol/week: 2.0 standard drinks    Types: 2 Shots of liquor per week    Comment: drinks on weekends, gin/vodka 1/5th.  . Drug use: No     Allergies   Aspirin   Review of Systems Review of Systems  Constitutional: Negative for appetite change and fatigue.  HENT: Negative for congestion, ear discharge and sinus pressure.   Eyes: Negative for  discharge.  Respiratory: Negative for cough.   Cardiovascular: Negative for chest pain.  Gastrointestinal: Negative for abdominal pain and diarrhea.  Genitourinary: Negative for frequency and hematuria.  Musculoskeletal: Negative for back pain.  Skin: Negative for rash.  Neurological: Positive for dizziness. Negative for seizures and headaches.  Psychiatric/Behavioral: Negative for hallucinations.     Physical Exam Updated Vital Signs BP (!) 143/95   Pulse 84   Temp 98.6 F (37 C) (Oral)   Resp 10   Ht 6' (1.829 m)   Wt 117.9 kg   SpO2 97%   BMI 35.26 kg/m   Physical Exam Vitals signs reviewed.  Constitutional:      Appearance: He is well-developed.  HENT:     Head: Normocephalic.     Right Ear: Tympanic membrane normal.     Left Ear: Tympanic membrane normal.     Mouth/Throat:     Mouth: Mucous membranes are moist.  Eyes:     General: No scleral icterus.    Conjunctiva/sclera: Conjunctivae normal.  Neck:     Musculoskeletal: Neck supple.     Thyroid: No thyromegaly.  Cardiovascular:     Rate and Rhythm: Normal rate and regular rhythm.     Heart sounds: No murmur. No friction rub. No gallop.   Pulmonary:     Breath sounds: No stridor. No wheezing or rales.  Chest:     Chest wall: No tenderness.  Abdominal:     General: There is no distension.     Tenderness: There is no abdominal tenderness. There is no rebound.  Musculoskeletal: Normal range of motion.  Lymphadenopathy:     Cervical: No cervical adenopathy.  Skin:    Findings: No erythema or rash.  Neurological:     Mental Status: He is oriented to person, place, and time.     Motor: No abnormal muscle tone.     Coordination: Coordination normal.  Psychiatric:        Behavior: Behavior normal.      ED Treatments / Results  Labs (all labs ordered are listed, but only abnormal results are displayed) Labs Reviewed  CBC WITH DIFFERENTIAL/PLATELET - Abnormal; Notable for the following  components:       Result Value   Platelets 148 (*)    All other components within normal limits  COMPREHENSIVE METABOLIC PANEL - Abnormal; Notable for the following components:   Creatinine, Ser 1.26 (*)    Calcium 8.8 (*)    GFR calc non Af Amer 58 (*)    All other components within normal limits  URINALYSIS, ROUTINE W REFLEX MICROSCOPIC - Abnormal; Notable for the following components:   APPearance HAZY (*)    Leukocytes, UA TRACE (*)    Bacteria, UA MANY (*)    All other components within normal limits  ETHANOL - Abnormal; Notable for the following components:   Alcohol, Ethyl (B) 247 (*)    All other components within normal limits  URINE CULTURE  TROPONIN I    EKG None  Radiology Ct Head Wo Contrast  Result Date: 08/25/2018 CLINICAL DATA:  Dizziness EXAM: CT HEAD WITHOUT CONTRAST TECHNIQUE: Contiguous axial images were obtained from the base of the skull through the vertex without intravenous contrast. COMPARISON:  None. FINDINGS: Brain: There is mild diffuse atrophy. There is no intracranial mass, hemorrhage, extra-axial fluid collection, or midline shift. There is patchy small vessel disease throughout the centra semiovale bilaterally. There is small vessel disease in the anterior limb of the right internal capsule. There is mild small vessel disease in each thalamus. There is no evident acute infarct. Vascular: There is no hyperdense vessel. There is calcification in each distal vertebral artery and carotid siphon region. Skull: The bony calvarium appears intact. Sinuses/Orbits: There is mucosal thickening in several ethmoid air cells. There is mucosal thickening in the lateral right maxillary antrum. Other visualized paranasal sinuses are clear. Orbits appear symmetric bilaterally. Other: There is opacification in several inferior mastoid air cells on the right. Mastoids elsewhere appear clear. IMPRESSION: Atrophy with supratentorial small vessel disease. No acute infarct evident. No mass or  hemorrhage. There are foci of arterial vascular calcification. There are areas of paranasal sinus disease. There is inferior right mastoid disease. Electronically Signed   By: Lowella Grip III M.D.   On: 08/25/2018 20:39    Procedures Procedures (including critical care time)  Medications Ordered in ED Medications  0.9 %  sodium chloride infusion (250 mLs Intravenous New Bag/Given 08/25/18 1851)  sodium chloride 0.9 % bolus 500 mL (0 mLs Intravenous Stopped 08/25/18 1839)  cefTRIAXone (ROCEPHIN) 1 g in sodium chloride 0.9 % 100 mL IVPB (1 g Intravenous New Bag/Given 08/25/18 1852)     Initial Impression / Assessment and Plan / ED Course  I have reviewed the triage vital signs and the nursing notes.  Pertinent labs & imaging results that were available during my care of the patient were reviewed by me and considered in my medical decision making (see chart for details).     Labs show patient to have a urinary tract infection and an alcohol level that is about 250.  I suspect alcohol is causing him to feel dizzy.  He will be placed on Keflex for the urinary tract infection and was told to stop drinking alcohol  Final Clinical Impressions(s) / ED Diagnoses   Final diagnoses:  Acute cystitis with hematuria  ETOH abuse    ED Discharge Orders         Ordered    cephALEXin (KEFLEX) 500 MG capsule  3 times daily     08/25/18 2112           Milton Ferguson, MD 08/25/18 2117

## 2018-08-25 NOTE — ED Triage Notes (Signed)
Patient complaining of sudden onset of dizziness at 1500 today while sitting in the bed watching TV.

## 2018-08-28 LAB — URINE CULTURE: Culture: >=100000 — AB

## 2018-08-29 ENCOUNTER — Telehealth: Payer: Self-pay | Admitting: Emergency Medicine

## 2018-08-29 NOTE — Telephone Encounter (Signed)
Post ED Visit - Positive Culture Follow-up  Culture report reviewed by antimicrobial stewardship pharmacist:  []  Elenor Quinones, Pharm.D. []  Heide Guile, Pharm.D., BCPS AQ-ID [x]  Parks Neptune, Pharm.D., BCPS []  Alycia Rossetti, Pharm.D., BCPS []  [place], Pharm.D., BCPS, AAHIVP []  Legrand Como, Pharm.D., BCPS, AAHIVP []  Salome Arnt, PharmD, BCPS []  Johnnette Gourd, PharmD, BCPS []  Hughes Better, PharmD, BCPS []  Leeroy Cha, PharmD  Positive urine culture Treated with cephalexin, organism sensitive to the same and no further patient follow-up is required at this time.  Hazle Nordmann 08/29/2018, 10:26 AM

## 2018-08-30 ENCOUNTER — Other Ambulatory Visit: Payer: Self-pay | Admitting: Orthopedic Surgery

## 2018-09-02 ENCOUNTER — Telehealth: Payer: Self-pay | Admitting: Orthopaedic Surgery

## 2018-09-02 ENCOUNTER — Other Ambulatory Visit: Payer: Self-pay | Admitting: Orthopedic Surgery

## 2018-09-02 NOTE — Telephone Encounter (Signed)
Patient requests refill on:  HYDROcodone-acetaminophen (NORCO/VICODIN) 5-325 MG tablet 40 tablet   -[facility]

## 2018-09-05 MED ORDER — HYDROCODONE-ACETAMINOPHEN 5-325 MG PO TABS: ORAL_TABLET | ORAL | 0 refills | Status: DC

## 2018-09-07 ENCOUNTER — Other Ambulatory Visit: Payer: Self-pay | Admitting: Orthopedic Surgery

## 2018-09-13 ENCOUNTER — Other Ambulatory Visit: Payer: Self-pay

## 2018-09-13 ENCOUNTER — Emergency Department (HOSPITAL_COMMUNITY): Payer: Medicare Other

## 2018-09-13 ENCOUNTER — Encounter (HOSPITAL_COMMUNITY): Payer: Self-pay | Admitting: *Deleted

## 2018-09-13 ENCOUNTER — Emergency Department (HOSPITAL_COMMUNITY)
Admission: EM | Admit: 2018-09-13 | Discharge: 2018-09-13 | Disposition: A | Discharge status: Home / Self Care | Payer: Medicare Other | Attending: Emergency Medicine | Admitting: Emergency Medicine

## 2018-09-13 DIAGNOSIS — Z96652 Presence of left artificial knee joint: Secondary | ICD-10-CM | POA: Diagnosis not present

## 2018-09-13 DIAGNOSIS — Z79899 Other long term (current) drug therapy: Secondary | ICD-10-CM | POA: Diagnosis not present

## 2018-09-13 DIAGNOSIS — B9789 Other viral agents as the cause of diseases classified elsewhere: Secondary | ICD-10-CM | POA: Diagnosis not present

## 2018-09-13 DIAGNOSIS — K4090 Unilateral inguinal hernia, without obstruction or gangrene, not specified as recurrent: Secondary | ICD-10-CM | POA: Insufficient documentation

## 2018-09-13 DIAGNOSIS — J069 Acute upper respiratory infection, unspecified: Secondary | ICD-10-CM

## 2018-09-13 DIAGNOSIS — I10 Essential (primary) hypertension: Secondary | ICD-10-CM | POA: Insufficient documentation

## 2018-09-13 DIAGNOSIS — E039 Hypothyroidism, unspecified: Secondary | ICD-10-CM | POA: Diagnosis not present

## 2018-09-13 DIAGNOSIS — Z87891 Personal history of nicotine dependence: Secondary | ICD-10-CM | POA: Insufficient documentation

## 2018-09-13 DIAGNOSIS — R05 Cough: Secondary | ICD-10-CM | POA: Diagnosis not present

## 2018-09-13 LAB — CBC WITH DIFFERENTIAL/PLATELET
ABS IMMATURE GRANULOCYTES: 0.04 10*3/uL (ref 0.00–0.07)
Basophils Absolute: 0 10*3/uL (ref 0.0–0.1)
Basophils Relative: 0 %
Eosinophils Absolute: 0.4 10*3/uL (ref 0.0–0.5)
Eosinophils Relative: 5 %
HCT: 43.6 % (ref 39.0–52.0)
HEMOGLOBIN: 14.1 g/dL (ref 13.0–17.0)
Immature Granulocytes: 1 %
Lymphocytes Relative: 19 %
Lymphs Abs: 1.7 10*3/uL (ref 0.7–4.0)
MCH: 30.7 pg (ref 26.0–34.0)
MCHC: 32.3 g/dL (ref 30.0–36.0)
MCV: 94.8 fL (ref 80.0–100.0)
Monocytes Absolute: 0.9 10*3/uL (ref 0.1–1.0)
Monocytes Relative: 11 %
NEUTROS ABS: 5.8 10*3/uL (ref 1.7–7.7)
Neutrophils Relative %: 64 %
Platelets: 136 10*3/uL — ABNORMAL LOW (ref 150–400)
RBC: 4.6 MIL/uL (ref 4.22–5.81)
RDW: 13.2 % (ref 11.5–15.5)
WBC: 8.9 10*3/uL (ref 4.0–10.5)
nRBC: 0 % (ref 0.0–0.2)

## 2018-09-13 LAB — BASIC METABOLIC PANEL
ANION GAP: 13 (ref 5–15)
BUN: 19 mg/dL (ref 8–23)
CO2: 25 mmol/L (ref 22–32)
Calcium: 9.1 mg/dL (ref 8.9–10.3)
Chloride: 98 mmol/L (ref 98–111)
Creatinine, Ser: 1.45 mg/dL — ABNORMAL HIGH (ref 0.61–1.24)
GFR calc non Af Amer: 49 mL/min — ABNORMAL LOW (ref >60)
GFR, EST AFRICAN AMERICAN: 57 mL/min — AB (ref >60)
Glucose, Bld: 124 mg/dL — ABNORMAL HIGH (ref 70–99)
Potassium: 3.7 mmol/L (ref 3.5–5.1)
Sodium: 136 mmol/L (ref 135–145)

## 2018-09-13 NOTE — ED Provider Notes (Signed)
[facility] EMERGENCY DEPARTMENT Provider Note   CSN: [id number] Arrival date & time: 09/13/18  1556     History   Chief Complaint Chief Complaint  Patient presents with  . Cough    HPI [name] is a 68 y.o. male.  HPI   Patient presents for evaluation of cough, nonproductive for several days.  He is also been having some nausea and vomiting.  He is concerned about a hernia that has been present in his left groin for many months.  He denies fever, chills, weakness or dizziness.  He came here by private vehicle for evaluation.  He states that his PCP referred him to a surgeon for the hernia, but he has not been able to see the surgeon, yet, because "no one called me back."  There are no other known modifying factors.  Past Medical History:  Diagnosis Date  . Anemia    FeDA: GASTRIC POLYPS, B12; TCS 2008 EGD 2009, 2008-HB 11.1 MCV 83.6 CR 1.22, 2009 FERRITIN 102-22  . B12 deficiency   . Carcinoid tumor determined by biopsy of stomach 09/25/2014  . Chronic knee pain   . Diverticulosis of colon    Lower GI bleed 2008  . Essential hypertension   . GERD (gastroesophageal reflux disease)   . Gout   . Hepatomegaly    Hepatic steatosis  . History of alcohol abuse   . History of septic arthritis   . Hypothyroidism   . PUD   . Substance abuse [facility])     Patient Active Problem List   Diagnosis Date Noted  . S/P total knee replacement, left 06/29/06 01/27/2018  . Hyperlipidemia 10/23/2016  . Mild intellectual disabilities (CODE) 09/29/2016  . Personal history of gout 09/22/2016  . Literacy level of illiterate 09/22/2016  . S/P TKR (total knee replacement), right 09/18/08 09/22/2016  . Carcinoid tumor determined by biopsy of stomach 04/10/2016  . Tubulovillous adenoma of colon 09/12/2015  . Neuroendocrine tumor 09/25/2014  . Elevated LFTs 07/17/2014  . Iron deficiency anemia 07/29/2010  . Fatty liver 07/29/2010  . ABUSE, ALCOHOL, UNSPECIFIED 11/30/2008  . Osteoarthrosis  involving lower leg 08/20/2008  . Essential hypertension 10/20/2006  . B12 deficiency 08/15/2006  . GERD 08/15/2006  . IBS 08/15/2006  . BPH without obstruction/lower urinary tract symptoms 08/15/2006  . OSTEOARTHRITIS 08/15/2006  . DEGENERATION, DISC NOS 08/15/2006    Past Surgical History:  Procedure Laterality Date  . BIOPSY  08/14/2014   Procedure: BIOPSY;  Surgeon: Danie Binder, MD;  Location: [facility];  Service: Endoscopy;;  . BIOPSY  03/12/2015   Procedure: BIOPSY;  Surgeon: Danie Binder, MD;  Location: [facility];  Service: Endoscopy;;  . BIOPSY  04/21/2016   Procedure: BIOPSY;  Surgeon: Danie Binder, MD;  Location: [facility];  Service: Endoscopy;;  bx's of antrum, body of stomach, fundus, and cardia  . CHOLECYSTECTOMY    . COLONOSCOPY  2008 [facility] DJ   LGIB 2o to TICS, prep good  . COLONOSCOPY WITH PROPOFOL N/A 08/14/2014   SLF: 1. Four large colon polyps removed. 2. The left colon is redundant 3. Moderate diverticulosis throughout teh entire examined colon  . COLONOSCOPY WITH PROPOFOL N/A 11/06/2014   SLF: 8 small polyps removed. One large pedunculated polyp removed from the ascending colon, tubulovillous and tubular adenomas. Next colonoscopy March 2019  . COLONOSCOPY WITH PROPOFOL N/A 01/04/2018   Procedure: COLONOSCOPY WITH PROPOFOL;  Surgeon: Danie Binder, MD;  Location: [facility];  Service: Endoscopy;  Laterality: N/A;  10:00am  . ESOPHAGOGASTRODUODENOSCOPY  12/08/2007   FOY:DXAJ gastric polyps seen in the cardia and body of the stomach/Normal esophagus without evidence of Barrett's, mass, erosion/Normal duodenal bulb and second portion of the duodenum. Benign bx.  . ESOPHAGOGASTRODUODENOSCOPY (EGD) WITH PROPOFOL N/A 08/14/2014   SLF: 1. Heme postive stools due to gastric and colon polyps 2. Multiple gastric   . ESOPHAGOGASTRODUODENOSCOPY (EGD) WITH PROPOFOL N/A 03/12/2015   SLF: Multiple gastric nodules seen in gasric body/antrum. 2. Non-erosive gastritis (  inflammation) was found in the gastric antrum.   . ESOPHAGOGASTRODUODENOSCOPY (EGD) WITH PROPOFOL N/A 10/08/2015   Procedure: ESOPHAGOGASTRODUODENOSCOPY (EGD) WITH PROPOFOL;  Surgeon: Danie Binder, MD;  [facility];  Service: Endoscopy;  Laterality: N/A;  0945  . ESOPHAGOGASTRODUODENOSCOPY (EGD) WITH PROPOFOL N/A 04/21/2016   Procedure: ESOPHAGOGASTRODUODENOSCOPY (EGD) WITH PROPOFOL;  Surgeon: Danie Binder, MD;  [facility];  Service: Endoscopy;  Laterality: N/A;  730   . HAND SURGERY Right    fracture repair with plates  . JOINT REPLACEMENT Bilateral    knees  . POLYPECTOMY  08/14/2014   Procedure: POLYPECTOMY;  Surgeon: Danie Binder, MD;  Location: [facility];  Service: Endoscopy;;  . POLYPECTOMY N/A 11/06/2014   Procedure: POLYPECTOMY;  Surgeon: Danie Binder, MD;  Location: [facility];  Service: Endoscopy;  Laterality: N/A;  Transverse Colon x3, Ascending Colon x2, Descending Colon x3  . REPLACEMENT TOTAL KNEE BILATERAL    . UPPER GASTROINTESTINAL ENDOSCOPY  APR 2009   INFLAMED HYPERPLASTIC POLYPS, CHRONIC GASTRITIS        Home Medications    Prior to Admission medications   Medication Sig Start Date End Date Taking? Authorizing Provider  amLODipine (NORVASC) 5 MG tablet TAKE 1 TABLET BY MOUTH ONCE DAILY. Patient taking differently: Take 5 mg by mouth daily.  05/23/18  Yes Satira Sark, MD  atorvastatin (LIPITOR) 20 MG tablet Take 1 tablet (20 mg total) by mouth daily. 10/23/16  Yes Raylene Everts, MD  clindamycin (CLINDAGEL) 1 % gel Apply topically 2 (two) times daily. Patient taking differently: Apply 1 application topically 2 (two) times daily. Applied to back of beck for irritation 07/20/17  Yes Raylene Everts, MD  colchicine 0.6 MG tablet TAKE 1 TABLET BY MOUTH 3 TIMES DAILY X5 DAYS FOR GOUT PAIN. Patient taking differently: Take 0.6 mg by mouth daily.  09/07/18  Yes Sanjuana Kava, MD  HYDROcodone-acetaminophen (NORCO/VICODIN) 5-325 MG tablet One  tablet every six hours as needed for acute pain.  Must last 14 days. 09/05/18  Yes Sanjuana Kava, MD  Iron Polysacch Cmplx-B12-FA 150-0.025-1 MG CAPS Take 1 tablet by mouth daily. 05/17/18  Yes Higgs, Mathis Dad, MD  levothyroxine (SYNTHROID, LEVOTHROID) 200 MCG tablet TAKE 1 TABLET DAILY BEFORE BREAKFAST. Patient taking differently: Take 200 mcg by mouth daily before breakfast.  06/08/17  Yes Fayrene Helper, MD  metoprolol succinate (TOPROL-XL) 25 MG 24 hr tablet Take 1.5 tablets (37.5 mg total) by mouth daily. 01/04/18  Yes Branch, Alphonse Guild, MD  omeprazole (PRILOSEC) 20 MG capsule TAKE 1 CAPSULE BY MOUTH ONCE DAILY. Patient taking differently: Take 20 mg by mouth daily.  11/19/17  Yes Carlis Stable, NP  tamsulosin (FLOMAX) 0.4 MG CAPS capsule TAKE ONE CAPSULE BY MOUTH ONCE DAILY. Patient taking differently: Take 0.4 mg by mouth daily.  11/08/17  Yes Raylene Everts, MD  ULORIC 40 MG tablet TAKE 1 TABLET BY MOUTH ONCE A DAY. Patient taking differently: Take 40 mg  by mouth daily.  12/21/17  Yes Raylene Everts, MD  cephALEXin (KEFLEX) 500 MG capsule Take 1 capsule (500 mg total) by mouth 3 (three) times daily. Patient not taking: Reported on 09/13/2018 08/25/18   Milton Ferguson, MD    Family History Family History  Problem Relation Age of Onset  . Diabetes Mother   . Renal Disease Mother        failure  . Diabetes Father        'old age'  . Hypertension Sister   . Hypertension Brother   . Diabetes Brother   . Diabetes Sister   . Diabetes Sister   . Diabetes Brother   . Hypertension Brother   . Diabetes Brother        right BKA  . Kidney failure Brother        on dialysis  . Kidney failure Brother        on dialysis  . Diabetes Brother        bilateral BKA  . Colon polyps Neg Hx   . Colon cancer Neg Hx   . Pancreatic disease Neg Hx     Social History Social History   Tobacco Use  . Smoking status: Never Smoker  . Smokeless tobacco: Never Used  . Tobacco comment: Quit x  10 years/ never smoked on regular basis  Substance Use Topics  . Alcohol use: Yes    Alcohol/week: 2.0 standard drinks    Types: 2 Shots of liquor per week    Comment: drinks on weekends, gin/vodka 1/5th.  . Drug use: No     Allergies   Aspirin   Review of Systems Review of Systems  All other systems reviewed and are negative.    Physical Exam Updated Vital Signs BP (!) 159/104   Pulse (!) 108   Temp 98.7 F (37.1 C) (Oral)   Resp 18   Ht 6' (1.829 m)   Wt 117.9 kg   SpO2 97%   BMI 35.26 kg/m   Physical Exam Vitals signs and nursing note reviewed.  Constitutional:      General: He is not in acute distress.    Appearance: He is well-developed. He is obese. He is not ill-appearing, toxic-appearing or diaphoretic.  HENT:     Head: Normocephalic and atraumatic.     Right Ear: External ear normal.     Left Ear: External ear normal.  Eyes:     Conjunctiva/sclera: Conjunctivae normal.     Pupils: Pupils are equal, round, and reactive to light.  Neck:     Musculoskeletal: Normal range of motion and neck supple.     Trachea: Phonation normal.  Cardiovascular:     Rate and Rhythm: Normal rate and regular rhythm.     Heart sounds: Normal heart sounds.  Pulmonary:     Effort: Pulmonary effort is normal. No respiratory distress.     Breath sounds: Normal breath sounds. No stridor. No rhonchi.  Chest:     Chest wall: No tenderness.  Abdominal:     Palpations: Abdomen is soft.     Tenderness: There is no abdominal tenderness.  Genitourinary:    Comments: Reducible left groin hernia, extending to scrotum when standing but easily reduces.,  Skin breakdown left scrotal region in the crease between perineum and leg.  No bleeding or drainage from the site.  Penis scrotum and scrotal contents otherwise normal. Musculoskeletal: Normal range of motion.  Skin:    General: Skin is warm and dry.  Neurological:     Mental Status: He is alert and oriented to person, place, and  time.     Cranial Nerves: No cranial nerve deficit.     Sensory: No sensory deficit.     Motor: No abnormal muscle tone.     Coordination: Coordination normal.  Psychiatric:        Behavior: Behavior normal.        Thought Content: Thought content normal.        Judgment: Judgment normal.      ED Treatments / Results  Labs (all labs ordered are listed, but only abnormal results are displayed) Labs Reviewed  CBC WITH DIFFERENTIAL/PLATELET - Abnormal; Notable for the following components:      Result Value   Platelets 136 (*)    All other components within normal limits  BASIC METABOLIC PANEL - Abnormal; Notable for the following components:   Glucose, Bld 124 (*)    Creatinine, Ser 1.45 (*)    GFR calc non Af Amer 49 (*)    GFR calc Af Amer 57 (*)    All other components within normal limits    EKG None  Radiology Dg Chest 2 View  Result Date: 09/13/2018 CLINICAL DATA:  Fever, nonproductive cough. EXAM: CHEST - 2 VIEW COMPARISON:  Radiographs of February 18, 2017. FINDINGS: The heart size and mediastinal contours are within normal limits. Both lungs are clear. The visualized skeletal structures are unremarkable. IMPRESSION: No active cardiopulmonary disease. Electronically Signed   By: Marijo Conception, M.D.   On: 09/13/2018 16:27    Procedures Procedures (including critical care time)  Medications Ordered in ED Medications - No data to display   Initial Impression / Assessment and Plan / ED Course  I have reviewed the triage vital signs and the nursing notes.  Pertinent labs & imaging results that were available during my care of the patient were reviewed by me and considered in my medical decision making (see chart for details).  Clinical Course as of Sep 13 2321  Tue Sep 13, 2018  2146 Normal  CBC with Differential(!) [EW]  2146 Normal except glucose high, creatinine high, GFR low  Basic metabolic panel(!) [EW]  9326 Creatinine trending indicates mildly worse  pregnant and GFR at this time compared to prior   [EW]  2147 No infiltrate, or CHF, images reviewed by me  DG Chest 2 View [EW]    Clinical Course User Index [EW] Daleen Bo, MD     Patient Vitals for the past 24 hrs:  BP Temp Temp src Pulse Resp SpO2 Height Weight  09/13/18 2132 (!) 159/104 - - (!) 108 18 97 % - -  09/13/18 1609 - - - - - - 6' (1.829 m) 117.9 kg  09/13/18 1608 (!) 150/93 98.7 F (37.1 C) Oral (!) 123 18 98 % - -    11:17 PM Reevaluation with update and discussion. After initial assessment and treatment, an updated evaluation reveals findings discussed with the patient and all questions were answered.Daleen Bo   Medical Decision Making: Specific cough without findings for pneumonia.  Doubt bronchitis.  Suspect nonspecific cough, possibly irritant.  Patient with persistent left groin hernia, for several months, it is easily reducible.  There is no indication for further ED intervention or hospitalization, at this time.  CRITICAL CARE-no Performed by: Daleen Bo   Nursing Notes Reviewed/ Care Coordinated Applicable Imaging Reviewed Interpretation of Laboratory Data incorporated into ED treatment  The patient appears reasonably screened  and/or stabilized for discharge and I doubt any other medical condition or other [facility] requiring further screening, evaluation, or treatment in the ED at this time prior to discharge.  Plan: Home Medications-Robitussin-DM for cough, continue usual medications; Home Treatments-rest, fluids; return here if the recommended treatment, does not improve the symptoms; Recommended follow up-General surgery follow-up regarding left groin hernia.  Plan for elective repair as an outpatient    Final Clinical Impressions(s) / ED Diagnoses   Final diagnoses:  Viral URI with cough  Left groin hernia    ED Discharge Orders         Ordered    Ambulatory referral to General Surgery     09/13/18 2322           Daleen Bo,  MD 09/13/18 2323

## 2018-09-13 NOTE — ED Triage Notes (Signed)
Pt c/o fever, chills, cough that that is non productive that started Sunday with nausea and vomiting,

## 2018-09-13 NOTE — ED Notes (Signed)
ED Provider at bedside. 

## 2018-09-13 NOTE — Discharge Instructions (Signed)
Call the surgery office for an appointment to be seen about repair of the left inguinal hernia.  For your cough, use Robitussin-DM.  If you have fever or pain, use Tylenol every 4 hours.  Also, make sure you are drinking plenty of fluids to improve your ability to decrease the cough.

## 2018-09-20 ENCOUNTER — Encounter: Payer: Self-pay | Admitting: General Surgery

## 2018-09-20 ENCOUNTER — Ambulatory Visit (INDEPENDENT_AMBULATORY_CARE_PROVIDER_SITE_OTHER): Payer: Medicare Other | Admitting: General Surgery

## 2018-09-20 VITALS — BP 129/75 | HR 107 | Temp 97.3°F | Resp 18 | Wt 247.4 lb

## 2018-09-20 DIAGNOSIS — K4090 Unilateral inguinal hernia, without obstruction or gangrene, not specified as recurrent: Secondary | ICD-10-CM | POA: Diagnosis not present

## 2018-09-20 NOTE — Progress Notes (Signed)
[facility] History and Physical  Reason for Referral: Left inguinal hernia  Referring Physician: Dr. Eulis Foster (ED)     [name] is a 68 y.o. male.  HPI: [name] is a 68 yo with HTN, Hypothyroidism, GERD, h/o stomach carcinoid s/p endoscopic excision who comes in with complaints of a left groin hernia that he has noted for about 6 months he says. He says that the left groin is swollen and there is a bulge and that it causes him discomfort at times. He was recently seen in the ED and referred here for surgery as the hernia was causing him pain that day.  He says that he has normal BMs and eats and drinks normally. He has no obstructive symptoms.  He takes pain medication for chronic knee pain after knee replacement.    Past Medical History:  Diagnosis Date  . Anemia    FeDA: GASTRIC POLYPS, B12; TCS 2008 EGD 2009, 2008-HB 11.1 MCV 83.6 CR 1.22, 2009 FERRITIN 102-22  . B12 deficiency   . Carcinoid tumor determined by biopsy of stomach 09/25/2014  . Chronic knee pain   . Diverticulosis of colon    Lower GI bleed 2008  . Essential hypertension   . GERD (gastroesophageal reflux disease)   . Gout   . Hepatomegaly    Hepatic steatosis  . History of alcohol abuse   . History of septic arthritis   . Hypothyroidism   . PUD   . Substance abuse [facility])     Past Surgical History:  Procedure Laterality Date  . BIOPSY  08/14/2014   Procedure: BIOPSY;  Surgeon: Philip Binder, MD;  Location: [facility];  Service: Endoscopy;;  . BIOPSY  03/12/2015   Procedure: BIOPSY;  Surgeon: Philip Binder, MD;  Location: [facility];  Service: Endoscopy;;  . BIOPSY  04/21/2016   Procedure: BIOPSY;  Surgeon: Philip Binder, MD;  [facility];  Service: Endoscopy;;  bx's of antrum, body of stomach, fundus, and cardia  . CHOLECYSTECTOMY    . COLONOSCOPY  2008 [facility] DJ   LGIB 2o to TICS, prep good  . COLONOSCOPY WITH PROPOFOL N/A 08/14/2014   SLF: 1. Four large colon polyps removed. 2. The  left colon is redundant 3. Moderate diverticulosis throughout teh entire examined colon  . COLONOSCOPY WITH PROPOFOL N/A 11/06/2014   SLF: 8 small polyps removed. One large pedunculated polyp removed from the ascending colon, tubulovillous and tubular adenomas. Next colonoscopy March 2019  . COLONOSCOPY WITH PROPOFOL N/A 01/04/2018   Procedure: COLONOSCOPY WITH PROPOFOL;  Surgeon: Philip Binder, MD;  [facility];  Service: Endoscopy;  Laterality: N/A;  10:00am  . ESOPHAGOGASTRODUODENOSCOPY  12/08/2007   RXV:QMGQ gastric polyps seen in the cardia and body of the stomach/Normal esophagus without evidence of Barrett's, mass, erosion/Normal duodenal bulb and second portion of the duodenum. Benign bx.  . ESOPHAGOGASTRODUODENOSCOPY (EGD) WITH PROPOFOL N/A 08/14/2014   SLF: 1. Heme postive stools due to gastric and colon polyps 2. Multiple gastric   . ESOPHAGOGASTRODUODENOSCOPY (EGD) WITH PROPOFOL N/A 03/12/2015   SLF: Multiple gastric nodules seen in gasric body/antrum. 2. Non-erosive gastritis ( inflammation) was found in the gastric antrum.   . ESOPHAGOGASTRODUODENOSCOPY (EGD) WITH PROPOFOL N/A 10/08/2015   Procedure: ESOPHAGOGASTRODUODENOSCOPY (EGD) WITH PROPOFOL;  Surgeon: Philip Binder, MD;  [facility];  Service: Endoscopy;  Laterality: N/A;  0945  . ESOPHAGOGASTRODUODENOSCOPY (EGD) WITH PROPOFOL N/A 04/21/2016   Procedure: ESOPHAGOGASTRODUODENOSCOPY (EGD) WITH PROPOFOL;  Surgeon:  Philip Binder, MD;  [facility];  Service: Endoscopy;  Laterality: N/A;  730   . HAND SURGERY Right    fracture repair with plates  . JOINT REPLACEMENT Bilateral    knees  . POLYPECTOMY  08/14/2014   Procedure: POLYPECTOMY;  Surgeon: Philip Binder, MD;  Location: [facility];  Service: Endoscopy;;  . POLYPECTOMY N/A 11/06/2014   Procedure: POLYPECTOMY;  Surgeon: Philip Binder, MD;  Location: [facility];  Service: Endoscopy;  Laterality: N/A;  Transverse Colon x3, Ascending Colon x2, Descending  Colon x3  . REPLACEMENT TOTAL KNEE BILATERAL    . UPPER GASTROINTESTINAL ENDOSCOPY  APR 2009   INFLAMED HYPERPLASTIC POLYPS, CHRONIC GASTRITIS    Family History  Problem Relation Age of Onset  . Diabetes Mother   . Renal Disease Mother        failure  . Diabetes Father        'old age'  . Hypertension Sister   . Hypertension Brother   . Diabetes Brother   . Diabetes Sister   . Diabetes Sister   . Diabetes Brother   . Hypertension Brother   . Diabetes Brother        right BKA  . Kidney failure Brother        on dialysis  . Kidney failure Brother        on dialysis  . Diabetes Brother        bilateral BKA  . Colon polyps Neg Hx   . Colon cancer Neg Hx   . Pancreatic disease Neg Hx     Social History   Tobacco Use  . Smoking status: Never Smoker  . Smokeless tobacco: Never Used  . Tobacco comment: Quit x 10 years/ never smoked on regular basis  Substance Use Topics  . Alcohol use: Yes    Alcohol/week: 2.0 standard drinks    Types: 2 Shots of liquor per week    Comment: drinks on weekends, gin/vodka 1/5th.  . Drug use: No    Medications: I have reviewed the patient's current medications. Allergies as of 09/20/2018      Reactions   Aspirin Other (See Comments)   REACTION: Diverticular Bleed      Medication List       Accurate as of September 20, 2018 10:44 AM. Always use your most recent med list.        amLODipine 5 MG tablet Commonly known as:  NORVASC TAKE 1 TABLET BY MOUTH ONCE DAILY.   atorvastatin 20 MG tablet Commonly known as:  LIPITOR Take 1 tablet (20 mg total) by mouth daily.   clindamycin 1 % gel Commonly known as:  CLINDAGEL Apply topically 2 (two) times daily.   HYDROcodone-acetaminophen 5-325 MG tablet Commonly known as:  NORCO/VICODIN One tablet every six hours as needed for acute pain.  Must last 14 days.   Iron Polysacch Cmplx-B12-FA 150-0.025-1 MG Caps Take 1 tablet by mouth daily.   levothyroxine 200 MCG tablet Commonly  known as:  SYNTHROID, LEVOTHROID TAKE 1 TABLET DAILY BEFORE BREAKFAST.   metoprolol succinate 25 MG 24 hr tablet Commonly known as:  TOPROL-XL Take 1.5 tablets (37.5 mg total) by mouth daily.   omeprazole 20 MG capsule Commonly known as:  PRILOSEC TAKE 1 CAPSULE BY MOUTH ONCE DAILY.   tamsulosin 0.4 MG Caps capsule Commonly known as:  FLOMAX TAKE ONE CAPSULE BY MOUTH ONCE DAILY.   ULORIC 40 MG tablet Generic drug:  febuxostat TAKE 1 TABLET BY MOUTH  ONCE A DAY.        ROS:  A comprehensive review of systems was negative except for: Gastrointestinal: positive for left groin hernia Musculoskeletal: positive for knee pain  Blood pressure 129/75, pulse (!) 107, temperature (!) 97.3 F (36.3 C), temperature source Temporal, resp. rate 18, weight 247 lb 6.4 oz (112.2 kg). Physical Exam Vitals signs reviewed.  Constitutional:      Appearance: Normal appearance.  HENT:     Head: Normocephalic and atraumatic.     Nose: Nose normal.  Eyes:     Pupils: Pupils are equal, round, and reactive to light.  Neck:     Musculoskeletal: Normal range of motion.  Cardiovascular:     Rate and Rhythm: Normal rate and regular rhythm.  Pulmonary:     Breath sounds: Normal breath sounds.  Abdominal:     Hernia: A hernia is present. Hernia is present in the left inguinal area.  Musculoskeletal: Normal range of motion.        General: No swelling.  Skin:    General: Skin is warm and dry.  Neurological:     General: No focal deficit present.     Mental Status: He is alert and oriented to person, place, and time.  Psychiatric:        Mood and Affect: Mood normal.        Behavior: Behavior normal.        Thought Content: Thought content normal.        Judgment: Judgment normal.     Results: None   Assessment & Plan:  [name] is a 68 y.o. male with a left inguinal hernia that is causing him some discomfort. He would like to proceed with repair.   -OR for inguinal hernia with mesh  repair   All questions were answered to the satisfaction of the patient.  The risk and benefits of inguinal hernia surgery were discussed including but not limited to bleeding, infection, use of mesh, risk of recurrence, injury to the cord, nerve pain after surgery requiring medication.  After careful consideration, [name] has decided to proceed.    [name] 09/20/2018, 10:44 AM

## 2018-09-20 NOTE — Patient Instructions (Signed)
Open Hernia Repair, Adult  Open hernia repair is a surgical procedure to fix a hernia. A hernia occurs when an internal organ or tissue pushes out through a weak spot in the abdominal wall muscles. Hernias commonly occur in the groin and around the navel. Most hernias tend to get worse over time. Often, surgery is done to prevent the hernia from becoming bigger, uncomfortable, or an emergency. Emergency surgery may be needed if abdominal contents get stuck in the opening (incarcerated hernia) or the blood supply gets cut off (strangulated hernia). In an open repair, an incision is made in the abdomen to perform the surgery. Tell a health care provider about:  Any allergies you have.  All medicines you are taking, including vitamins, herbs, eye drops, creams, and over-the-counter medicines.  Any problems you or family members have had with anesthetic medicines.  Any blood or bone disorders you have.  Any surgeries you have had.  Any medical conditions you have, including any recent cold or flu symptoms.  Whether you are pregnant or may be pregnant. What are the risks? Generally, this is a safe procedure. However, problems may occur, including:  Long-lasting (chronic) pain.  Bleeding.  Infection.  Damage to the testicle. This can cause shrinking or swelling.  Damage to the bladder, blood vessels, intestine, or nerves near the hernia.  Trouble passing urine.  Allergic reactions to medicines.  Return of the hernia. Medicines  Ask your health care provider about: ? Changing or stopping your regular medicines. This is especially important if you are taking diabetes medicines or blood thinners. ? Taking medicines such as aspirin and ibuprofen. These medicines can thin your blood. Do not take these medicines before your procedure if your health care provider instructs you not to.  You may be given antibiotic medicine to help prevent infection. General instructions  You may have  blood tests or imaging studies.  Ask your health care provider how your surgical site will be marked or identified.  If you smoke, do not smoke for at least 2 weeks before your procedure or for as long as told by your health care provider.  Let your health care provider know if you develop a cold or any infection before your surgery.  Plan to have someone take you home from the hospital or clinic.  If you will be going home right after the procedure, plan to have someone with you for 24 hours. What happens during the procedure?  To reduce your risk of infection: ? Your health care team will wash or sanitize their hands. ? Your skin will be washed with soap. ? Hair may be removed from the surgical area.  An IV tube will be inserted into one of your veins.  You will be given one or more of the following: ? A medicine to help you relax (sedative). ? A medicine to numb the area (local anesthetic). ? A medicine to make you fall asleep (general anesthetic).  Your surgeon will make an incision over the hernia.  The tissues of the hernia will be moved back into place.  The edges of the hernia may be stitched together.  The opening in the abdominal muscles will be closed with stitches (sutures). Or, your surgeon will place a mesh patch made of manmade (synthetic) material over the opening.  The incision will be closed.  A bandage (dressing) may be placed over the incision. The procedure may vary among health care providers and hospitals. What happens after the procedure?  Your blood pressure, heart rate, breathing rate, and blood oxygen level will be monitored until the medicines you were given have worn off.  You may be given medicine for pain.  Do not drive for 24 hours if you received a sedative. This information is not intended to replace advice given to you by your health care provider. Make sure you discuss any questions you have with your health care provider. Document  Released: 02/10/2001 Document Revised: 03/06/2016 Document Reviewed: 01/29/2016 Elsevier Interactive Patient Education  2019 [employer] Inguinal Hernia, Adult An inguinal hernia is when fat or your intestines push through a weak spot in a muscle where your leg meets your lower belly (groin). This causes a rounded lump (bulge). This kind of hernia could also be:  In your scrotum, if you are male.  In folds of skin around your vagina, if you are male. There are three types of inguinal hernias. These include:  Hernias that can be pushed back into the belly (are reducible). This type rarely causes pain.  Hernias that cannot be pushed back into the belly (are incarcerated).  Hernias that cannot be pushed back into the belly and lose their blood supply (are strangulated). This type needs emergency surgery. If you do not have symptoms, you may not need treatment. If you have symptoms or a large hernia, you may need surgery. Follow these instructions at home: Lifestyle  Do these things if told by your doctor so you do not have trouble pooping (constipation): ? Drink enough fluid to keep your pee (urine) pale yellow. ? Eat foods that have a lot of fiber. These include fresh fruits and vegetables, whole grains, and beans. ? Limit foods that are high in fat and processed sugars. These include foods that are fried or sweet. ? Take medicine for trouble pooping.  Avoid lifting heavy objects.  Avoid standing for long amounts of time.  Do not use any products that contain nicotine or tobacco. These include cigarettes and e-cigarettes. If you need help quitting, ask your doctor.  Stay at a healthy weight. General instructions  You may try to push your hernia in by very gently pressing on it when you are lying down. Do not try to force the bulge back in if it will not push in easily.  Watch your hernia for any changes in shape, size, or color. Tell your doctor if you see any changes.  Take  over-the-counter and prescription medicines only as told by your doctor.  Keep all follow-up visits as told by your doctor. This is important. Contact a doctor if:  You have a fever.  You have new symptoms.  Your symptoms get worse. Get help right away if:  The area where your leg meets your lower belly has: ? Pain that gets worse suddenly. ? A bulge that gets bigger suddenly, and it does not get smaller after that. ? A bulge that turns red or purple. ? A bulge that is painful when you touch it.  You are a man, and your scrotum: ? Suddenly feels painful. ? Suddenly changes in size.  You cannot push the hernia in by very gently pressing on it when you are lying down. Do not try to force the bulge back in if it will not push in easily.  You feel sick to your stomach (nauseous), and that feeling does not go away.  You throw up (vomit), and that keeps happening.  You have a fast heartbeat.  You cannot poop (have a  bowel movement) or pass gas. These symptoms may be an emergency. Do not wait to see if the symptoms will go away. Get medical help right away. Call your local emergency services (911 in the [place] Summary  An inguinal hernia is when fat or your intestines push through a weak spot in a muscle where your leg meets your lower belly (groin). This causes a rounded lump (bulge).  If you do not have symptoms, you may not need treatment. If you have symptoms or a large hernia, you may need surgery.  Avoid lifting heavy objects. Also avoid standing for long amounts of time.  Do not try to force the bulge back in if it will not push in easily. This information is not intended to replace advice given to you by your health care provider. Make sure you discuss any questions you have with your health care provider. Document Released: 09/17/2006 Document Revised: 09/18/2017 Document Reviewed: 05/19/2017 Elsevier Interactive Patient Education  2019 [employer].

## 2018-09-22 ENCOUNTER — Observation Stay (HOSPITAL_COMMUNITY)
Admission: EM | Admit: 2018-09-22 | Discharge: 2018-09-24 | Disposition: A | Discharge status: Home / Self Care | Payer: Medicare Other | Attending: Family Medicine | Admitting: Family Medicine

## 2018-09-22 ENCOUNTER — Other Ambulatory Visit: Payer: Self-pay

## 2018-09-22 ENCOUNTER — Ambulatory Visit (HOSPITAL_COMMUNITY): Payer: Medicare Other | Admitting: Internal Medicine

## 2018-09-22 ENCOUNTER — Other Ambulatory Visit (HOSPITAL_COMMUNITY): Payer: Medicare Other

## 2018-09-22 ENCOUNTER — Ambulatory Visit (HOSPITAL_COMMUNITY): Payer: Medicare Other

## 2018-09-22 ENCOUNTER — Encounter (HOSPITAL_COMMUNITY): Payer: Self-pay | Admitting: Emergency Medicine

## 2018-09-22 ENCOUNTER — Encounter: Payer: Self-pay | Admitting: Gastroenterology

## 2018-09-22 ENCOUNTER — Emergency Department (HOSPITAL_COMMUNITY): Payer: Medicare Other

## 2018-09-22 DIAGNOSIS — N3000 Acute cystitis without hematuria: Principal | ICD-10-CM | POA: Insufficient documentation

## 2018-09-22 DIAGNOSIS — R531 Weakness: Secondary | ICD-10-CM

## 2018-09-22 DIAGNOSIS — Z96653 Presence of artificial knee joint, bilateral: Secondary | ICD-10-CM | POA: Diagnosis not present

## 2018-09-22 DIAGNOSIS — N3001 Acute cystitis with hematuria: Secondary | ICD-10-CM | POA: Diagnosis not present

## 2018-09-22 DIAGNOSIS — N179 Acute kidney failure, unspecified: Secondary | ICD-10-CM | POA: Insufficient documentation

## 2018-09-22 DIAGNOSIS — I10 Essential (primary) hypertension: Secondary | ICD-10-CM | POA: Insufficient documentation

## 2018-09-22 DIAGNOSIS — E039 Hypothyroidism, unspecified: Secondary | ICD-10-CM | POA: Diagnosis not present

## 2018-09-22 DIAGNOSIS — Z550 Illiteracy and low-level literacy: Secondary | ICD-10-CM | POA: Diagnosis not present

## 2018-09-22 DIAGNOSIS — F70 Mild intellectual disabilities: Secondary | ICD-10-CM | POA: Diagnosis not present

## 2018-09-22 DIAGNOSIS — N400 Enlarged prostate without lower urinary tract symptoms: Secondary | ICD-10-CM

## 2018-09-22 DIAGNOSIS — N401 Enlarged prostate with lower urinary tract symptoms: Secondary | ICD-10-CM | POA: Diagnosis not present

## 2018-09-22 DIAGNOSIS — R05 Cough: Secondary | ICD-10-CM | POA: Diagnosis not present

## 2018-09-22 DIAGNOSIS — F1010 Alcohol abuse, uncomplicated: Secondary | ICD-10-CM | POA: Insufficient documentation

## 2018-09-22 DIAGNOSIS — K219 Gastro-esophageal reflux disease without esophagitis: Secondary | ICD-10-CM | POA: Diagnosis not present

## 2018-09-22 DIAGNOSIS — N390 Urinary tract infection, site not specified: Secondary | ICD-10-CM | POA: Diagnosis present

## 2018-09-22 LAB — CBC WITH DIFFERENTIAL/PLATELET
Abs Immature Granulocytes: 0.1 10*3/uL — ABNORMAL HIGH (ref 0.00–0.07)
Basophils Absolute: 0 10*3/uL (ref 0.0–0.1)
Basophils Relative: 0 %
EOS ABS: 0.1 10*3/uL (ref 0.0–0.5)
Eosinophils Relative: 1 %
HEMATOCRIT: 40.9 % (ref 39.0–52.0)
Hemoglobin: 12.9 g/dL — ABNORMAL LOW (ref 13.0–17.0)
Immature Granulocytes: 1 %
LYMPHS ABS: 1.7 10*3/uL (ref 0.7–4.0)
Lymphocytes Relative: 13 %
MCH: 30.3 pg (ref 26.0–34.0)
MCHC: 31.5 g/dL (ref 30.0–36.0)
MCV: 96 fL (ref 80.0–100.0)
Monocytes Absolute: 1.6 10*3/uL — ABNORMAL HIGH (ref 0.1–1.0)
Monocytes Relative: 12 %
Neutro Abs: 9.8 10*3/uL — ABNORMAL HIGH (ref 1.7–7.7)
Neutrophils Relative %: 73 %
PLATELETS: 215 10*3/uL (ref 150–400)
RBC: 4.26 MIL/uL (ref 4.22–5.81)
RDW: 13 % (ref 11.5–15.5)
WBC: 13.4 10*3/uL — ABNORMAL HIGH (ref 4.0–10.5)
nRBC: 0 % (ref 0.0–0.2)

## 2018-09-22 LAB — URINALYSIS, ROUTINE W REFLEX MICROSCOPIC
Bilirubin Urine: NEGATIVE
Glucose, UA: NEGATIVE mg/dL
Ketones, ur: NEGATIVE mg/dL
Nitrite: NEGATIVE
Protein, ur: 30 mg/dL — AB
Specific Gravity, Urine: 1.02 (ref 1.005–1.030)
WBC, UA: >50 WBC/hpf — ABNORMAL HIGH (ref 0–5)
pH: 5 (ref 5.0–8.0)

## 2018-09-22 LAB — COMPREHENSIVE METABOLIC PANEL
ALT: 23 U/L (ref 0–44)
ANION GAP: 12 (ref 5–15)
AST: 25 U/L (ref 15–41)
Albumin: 3.4 g/dL — ABNORMAL LOW (ref 3.5–5.0)
Alkaline Phosphatase: 49 U/L (ref 38–126)
BUN: 24 mg/dL — ABNORMAL HIGH (ref 8–23)
CO2: 25 mmol/L (ref 22–32)
Calcium: 8.8 mg/dL — ABNORMAL LOW (ref 8.9–10.3)
Chloride: 96 mmol/L — ABNORMAL LOW (ref 98–111)
Creatinine, Ser: 2.06 mg/dL — ABNORMAL HIGH (ref 0.61–1.24)
GFR calc Af Amer: 37 mL/min — ABNORMAL LOW (ref >60)
GFR calc non Af Amer: 32 mL/min — ABNORMAL LOW (ref >60)
GLUCOSE: 115 mg/dL — AB (ref 70–99)
Potassium: 4.3 mmol/L (ref 3.5–5.1)
Sodium: 133 mmol/L — ABNORMAL LOW (ref 135–145)
Total Bilirubin: 1.3 mg/dL — ABNORMAL HIGH (ref 0.3–1.2)
Total Protein: 8.3 g/dL — ABNORMAL HIGH (ref 6.5–8.1)

## 2018-09-22 LAB — BRAIN NATRIURETIC PEPTIDE: B Natriuretic Peptide: 26 pg/mL (ref 0.0–100.0)

## 2018-09-22 LAB — LACTIC ACID, PLASMA
Lactic Acid, Venous: 1.1 mmol/L (ref 0.5–1.9)
Lactic Acid, Venous: 1 mmol/L (ref 0.5–1.9)

## 2018-09-22 LAB — INFLUENZA PANEL BY PCR (TYPE A & B)
Influenza A By PCR: NEGATIVE
Influenza B By PCR: NEGATIVE

## 2018-09-22 LAB — TROPONIN I: Troponin I: <0.03 ng/mL (ref <0.03)

## 2018-09-22 MED ORDER — IRON POLYSACCH CMPLX-B12-FA 150-0.025-1 MG PO CAPS: 1.0000 | ORAL_CAPSULE | Freq: Every day | ORAL | Status: DC

## 2018-09-22 MED ORDER — ONDANSETRON HCL 4 MG PO TABS: 4.0000 mg | ORAL_TABLET | Freq: Four times a day (QID) | ORAL | Status: DC | PRN

## 2018-09-22 MED ORDER — LORAZEPAM 2 MG/ML IJ SOLN: 1.0000 mg | Freq: Four times a day (QID) | INTRAMUSCULAR | Status: DC | PRN

## 2018-09-22 MED ORDER — SODIUM CHLORIDE 0.9 % IV BOLUS
500.0000 mL | Freq: Once | INTRAVENOUS | Status: AC
  Administered 2018-09-22: 500 mL via INTRAVENOUS

## 2018-09-22 MED ORDER — SODIUM CHLORIDE 0.9 % IV SOLN
1.0000 g | Freq: Once | INTRAVENOUS | Status: AC
  Administered 2018-09-22: 1 g via INTRAVENOUS
  Filled 2018-09-22: qty 10

## 2018-09-22 MED ORDER — FE FUMARATE-B12-VIT C-FA-IFC PO CAPS
1.0000 | ORAL_CAPSULE | Freq: Every day | ORAL | Status: DC
  Administered 2018-09-23 – 2018-09-24 (×2): 1 via ORAL
  Filled 2018-09-22 (×2): qty 1

## 2018-09-22 MED ORDER — AMLODIPINE BESYLATE 5 MG PO TABS
5.0000 mg | ORAL_TABLET | Freq: Every day | ORAL | Status: DC
  Administered 2018-09-23 – 2018-09-24 (×2): 5 mg via ORAL
  Filled 2018-09-22 (×2): qty 1

## 2018-09-22 MED ORDER — FEBUXOSTAT 40 MG PO TABS
40.0000 mg | ORAL_TABLET | Freq: Every day | ORAL | Status: DC
  Administered 2018-09-23 – 2018-09-24 (×2): 40 mg via ORAL
  Filled 2018-09-22 (×2): qty 1

## 2018-09-22 MED ORDER — LEVOTHYROXINE SODIUM 100 MCG PO TABS
200.0000 ug | ORAL_TABLET | Freq: Every day | ORAL | Status: DC
  Administered 2018-09-23 – 2018-09-24 (×2): 200 ug via ORAL
  Filled 2018-09-22: qty 2
  Filled 2018-09-22: qty 4
  Filled 2018-09-22: qty 2

## 2018-09-22 MED ORDER — HYDROCODONE-ACETAMINOPHEN 5-325 MG PO TABS
1.0000 | ORAL_TABLET | ORAL | Status: DC | PRN
  Administered 2018-09-24: 1 via ORAL
  Filled 2018-09-22: qty 1

## 2018-09-22 MED ORDER — SODIUM CHLORIDE 0.9 % IV SOLN: 250.0000 mL | INTRAVENOUS | Status: DC | PRN

## 2018-09-22 MED ORDER — TRAZODONE HCL 50 MG PO TABS: 50.0000 mg | ORAL_TABLET | Freq: Every evening | ORAL | Status: DC | PRN

## 2018-09-22 MED ORDER — SODIUM CHLORIDE 0.9 % IV SOLN
1.0000 g | INTRAVENOUS | Status: DC
  Administered 2018-09-23 – 2018-09-24 (×2): 1 g via INTRAVENOUS
  Filled 2018-09-22 (×2): qty 10
  Filled 2018-09-22 (×2): qty 1
  Filled 2018-09-22: qty 10

## 2018-09-22 MED ORDER — ONDANSETRON HCL 4 MG/2ML IJ SOLN: 4.0000 mg | Freq: Four times a day (QID) | INTRAMUSCULAR | Status: DC | PRN

## 2018-09-22 MED ORDER — METOPROLOL SUCCINATE ER 25 MG PO TB24
37.5000 mg | ORAL_TABLET | Freq: Every day | ORAL | Status: DC
  Administered 2018-09-23 – 2018-09-24 (×2): 37.5 mg via ORAL
  Filled 2018-09-22 (×2): qty 2

## 2018-09-22 MED ORDER — SODIUM CHLORIDE 0.9% FLUSH: 3.0000 mL | INTRAVENOUS | Status: DC | PRN

## 2018-09-22 MED ORDER — VITAMIN B-1 100 MG PO TABS
100.0000 mg | ORAL_TABLET | Freq: Every day | ORAL | Status: DC
  Administered 2018-09-22 – 2018-09-23 (×2): 100 mg via ORAL
  Filled 2018-09-22 (×2): qty 1

## 2018-09-22 MED ORDER — ALBUTEROL SULFATE (2.5 MG/3ML) 0.083% IN NEBU: 2.5000 mg | INHALATION_SOLUTION | RESPIRATORY_TRACT | Status: DC | PRN

## 2018-09-22 MED ORDER — HEPARIN SODIUM (PORCINE) 5000 UNIT/ML IJ SOLN
5000.0000 [IU] | Freq: Three times a day (TID) | INTRAMUSCULAR | Status: DC
  Administered 2018-09-22 – 2018-09-24 (×7): 5000 [IU] via SUBCUTANEOUS
  Filled 2018-09-22 (×6): qty 1

## 2018-09-22 MED ORDER — SODIUM CHLORIDE 0.9 % IV BOLUS
1000.0000 mL | Freq: Once | INTRAVENOUS | Status: AC
  Administered 2018-09-22: 1000 mL via INTRAVENOUS

## 2018-09-22 MED ORDER — TAMSULOSIN HCL 0.4 MG PO CAPS
0.4000 mg | ORAL_CAPSULE | Freq: Every day | ORAL | Status: DC
  Administered 2018-09-23 – 2018-09-24 (×2): 0.4 mg via ORAL
  Filled 2018-09-22 (×2): qty 1

## 2018-09-22 MED ORDER — FOLIC ACID 1 MG PO TABS
1.0000 mg | ORAL_TABLET | Freq: Every day | ORAL | Status: DC
  Administered 2018-09-22 – 2018-09-24 (×3): 1 mg via ORAL
  Filled 2018-09-22 (×3): qty 1

## 2018-09-22 MED ORDER — ATORVASTATIN CALCIUM 20 MG PO TABS
20.0000 mg | ORAL_TABLET | Freq: Every day | ORAL | Status: DC
  Administered 2018-09-23 – 2018-09-24 (×2): 20 mg via ORAL
  Filled 2018-09-22 (×2): qty 1

## 2018-09-22 MED ORDER — SODIUM CHLORIDE 0.9% FLUSH
3.0000 mL | Freq: Two times a day (BID) | INTRAVENOUS | Status: DC
  Administered 2018-09-22 – 2018-09-23 (×2): 3 mL via INTRAVENOUS

## 2018-09-22 MED ORDER — ADULT MULTIVITAMIN W/MINERALS CH
1.0000 | ORAL_TABLET | Freq: Every day | ORAL | Status: DC
  Administered 2018-09-22 – 2018-09-24 (×3): 1 via ORAL
  Filled 2018-09-22 (×3): qty 1

## 2018-09-22 MED ORDER — THIAMINE HCL 100 MG/ML IJ SOLN
100.0000 mg | Freq: Every day | INTRAMUSCULAR | Status: DC
  Administered 2018-09-24: 100 mg via INTRAVENOUS
  Filled 2018-09-22: qty 2

## 2018-09-22 MED ORDER — ACETAMINOPHEN 325 MG PO TABS
650.0000 mg | ORAL_TABLET | Freq: Four times a day (QID) | ORAL | Status: DC | PRN
  Filled 2018-09-22: qty 2

## 2018-09-22 MED ORDER — POLYETHYLENE GLYCOL 3350 17 G PO PACK: 17.0000 g | PACK | Freq: Every day | ORAL | Status: DC | PRN

## 2018-09-22 MED ORDER — SODIUM CHLORIDE 0.9 % IV SOLN
INTRAVENOUS | Status: DC
  Administered 2018-09-22 – 2018-09-24 (×3): via INTRAVENOUS

## 2018-09-22 MED ORDER — LORAZEPAM 1 MG PO TABS: 1.0000 mg | ORAL_TABLET | Freq: Four times a day (QID) | ORAL | Status: DC | PRN

## 2018-09-22 MED ORDER — ACETAMINOPHEN 650 MG RE SUPP: 650.0000 mg | Freq: Four times a day (QID) | RECTAL | Status: DC | PRN

## 2018-09-22 MED ORDER — PANTOPRAZOLE SODIUM 40 MG PO TBEC
40.0000 mg | DELAYED_RELEASE_TABLET | Freq: Every day | ORAL | Status: DC
  Administered 2018-09-23 – 2018-09-24 (×2): 40 mg via ORAL
  Filled 2018-09-22 (×2): qty 1

## 2018-09-22 NOTE — Plan of Care (Signed)

## 2018-09-22 NOTE — H&P (Addendum)
Patient Demographics:    [name], is a 68 y.o. male  MRN: [id number]   DOB - [birth year]  Admit Date - 09/22/2018  Outpatient Primary MD for the patient is [name], MD   Assessment & Plan:    Principal Problem:   UTI (urinary tract infection) Active Problems:   Alcohol abuse   Essential hypertension   BPH without obstruction/lower urinary tract symptoms   Literacy level of illiterate    1) UTI----patient presented with urinary symptoms, intermittent hematuria, tachycardia, tachypnea and leukocytosis--treat empirically with IV Rocephin pending urine and blood cultures lactic acid not elevated  2)BPH with LUTs--Flomax as ordered  3) hypothyroidism--- continue levothyroxine 200 mcg daily  4)HTN--- okay to restart Toprol-XL 37.5 mg daily patient given tachycardia  5)HLD--- stable continue Lipitor 20 mg every afternoon  6)H/o EtOH abuse--- patient denies recent EtOH use, give folic acid, thiamine, use lorazepam per CIWA protocol  7)AKI--creatinine is up to 2.0 from 1.45 on 09/13/2018, avoid nephrotoxic agents, hydrate   With History of - Reviewed by me  Past Medical History:  Diagnosis Date  . Anemia    FeDA: GASTRIC POLYPS, B12; TCS 2008 EGD 2009, 2008-HB 11.1 MCV 83.6 CR 1.22, 2009 FERRITIN 102-22  . B12 deficiency   . Carcinoid tumor determined by biopsy of stomach 09/25/2014  . Chronic knee pain   . Diverticulosis of colon    Lower GI bleed 2008  . Essential hypertension   . GERD (gastroesophageal reflux disease)   . Gout   . Hepatomegaly    Hepatic steatosis  . History of alcohol abuse   . History of septic arthritis   . Hypothyroidism   . PUD   . Substance abuse [facility])       Past Surgical History:  Procedure Laterality Date  . BIOPSY  08/14/2014   Procedure: BIOPSY;  Surgeon:  Danie Binder, MD;  Location: [facility];  Service: Endoscopy;;  . BIOPSY  03/12/2015   Procedure: BIOPSY;  Surgeon: Danie Binder, MD;  Location: [facility];  Service: Endoscopy;;  . BIOPSY  04/21/2016   Procedure: BIOPSY;  Surgeon: Danie Binder, MD;  Location: [facility];  Service: Endoscopy;;  bx's of antrum, body of stomach, fundus, and cardia  . CHOLECYSTECTOMY    . COLONOSCOPY  2008 [facility] DJ   LGIB 2o to TICS, prep good  . COLONOSCOPY WITH PROPOFOL N/A 08/14/2014   SLF: 1. Four large colon polyps removed. 2. The left colon is redundant 3. Moderate diverticulosis throughout teh entire examined colon  . COLONOSCOPY WITH PROPOFOL N/A 11/06/2014   SLF: 8 small polyps removed. One large pedunculated polyp removed from the ascending colon, tubulovillous and tubular adenomas. Next colonoscopy March 2019  . COLONOSCOPY WITH PROPOFOL N/A 01/04/2018   Procedure: COLONOSCOPY WITH PROPOFOL;  Surgeon: Danie Binder, MD;  Location: [facility];  Service: Endoscopy;  Laterality: N/A;  10:00am  . ESOPHAGOGASTRODUODENOSCOPY  12/08/2007  GXQ:JJHE gastric polyps seen in the cardia and body of the stomach/Normal esophagus without evidence of Barrett's, mass, erosion/Normal duodenal bulb and second portion of the duodenum. Benign bx.  . ESOPHAGOGASTRODUODENOSCOPY (EGD) WITH PROPOFOL N/A 08/14/2014   SLF: 1. Heme postive stools due to gastric and colon polyps 2. Multiple gastric   . ESOPHAGOGASTRODUODENOSCOPY (EGD) WITH PROPOFOL N/A 03/12/2015   SLF: Multiple gastric nodules seen in gasric body/antrum. 2. Non-erosive gastritis ( inflammation) was found in the gastric antrum.   . ESOPHAGOGASTRODUODENOSCOPY (EGD) WITH PROPOFOL N/A 10/08/2015   Procedure: ESOPHAGOGASTRODUODENOSCOPY (EGD) WITH PROPOFOL;  Surgeon: Danie Binder, MD;  [facility];  Service: Endoscopy;  Laterality: N/A;  0945  . ESOPHAGOGASTRODUODENOSCOPY (EGD) WITH PROPOFOL N/A 04/21/2016   Procedure: ESOPHAGOGASTRODUODENOSCOPY (EGD) WITH  PROPOFOL;  Surgeon: Danie Binder, MD;  Location[facility];  Service: Endoscopy;  Laterality: N/A;  730   . HAND SURGERY Right    fracture repair with plates  . JOINT REPLACEMENT Bilateral    knees  . POLYPECTOMY  08/14/2014   Procedure: POLYPECTOMY;  Surgeon: Danie Binder, MD;  Location: [facility];  Service: Endoscopy;;  . POLYPECTOMY N/A 11/06/2014   Procedure: POLYPECTOMY;  Surgeon: Danie Binder, MD;  Location: [facility];  Service: Endoscopy;  Laterality: N/A;  Transverse Colon x3, Ascending Colon x2, Descending Colon x3  . REPLACEMENT TOTAL KNEE BILATERAL    . UPPER GASTROINTESTINAL ENDOSCOPY  APR 2009   INFLAMED HYPERPLASTIC POLYPS, CHRONIC GASTRITIS    Chief Complaint  Patient presents with  . Cough      HPI:    [name]  is a 68 y.o. male with past medical history relevant for BPH, alcohol abuse, GERD,  and HTN  presents to the ED with 4-day history of dysuria, intermittent hematuria and urinary frequency as well as marked malaise and fatigue and dry cough......  Denies fevers, no chills, had nausea but no emesis, no diarrhea  In ED patient is found to be tachycardic, slightly tachypneic with UA suggestive of a UTI  In ED--- she received IV fluids and IV Rocephin, however tachycardia persisted and he was found to have a leukocytosis of 13.4, please note on 09/13/2018 his white count was 8.9  Patient was also found to have elevated creatinine in the ED with creatinine up to 2.0 from 1.45 on 09/13/2018  Denies any flank pain  Lactic acid and  influenza test were negative,       Review of systems:    In addition to the HPI above,   A full Review of  Systems was done, all other systems reviewed are negative except as noted above in HPI , .    Social History:  Reviewed by me    Social History   Tobacco Use  . Smoking status: Never Smoker  . Smokeless tobacco: Never Used  . Tobacco comment: Quit x 10 years/ never smoked on regular basis  Substance Use  Topics  . Alcohol use: Yes    Alcohol/week: 2.0 standard drinks    Types: 2 Shots of liquor per week    Comment: drinks on weekends, gin/vodka 1/5th.       Family History :  Reviewed by me    Family History  Problem Relation Age of Onset  . Diabetes Mother   . Renal Disease Mother        failure  . Diabetes Father        'old age'  . Hypertension Sister   .  Hypertension Brother   . Diabetes Brother   . Diabetes Sister   . Diabetes Sister   . Diabetes Brother   . Hypertension Brother   . Diabetes Brother        right BKA  . Kidney failure Brother        on dialysis  . Kidney failure Brother        on dialysis  . Diabetes Brother        bilateral BKA  . Colon polyps Neg Hx   . Colon cancer Neg Hx   . Pancreatic disease Neg Hx     Home Medications:   Prior to Admission medications   Medication Sig Start Date End Date Taking? Authorizing Provider  amLODipine (NORVASC) 5 MG tablet TAKE 1 TABLET BY MOUTH ONCE DAILY. Patient taking differently: Take 5 mg by mouth daily.  05/23/18  Yes Satira Sark, MD  atorvastatin (LIPITOR) 20 MG tablet Take 1 tablet (20 mg total) by mouth daily. 10/23/16  Yes Raylene Everts, MD  clindamycin (CLINDAGEL) 1 % gel Apply topically 2 (two) times daily. Patient taking differently: Apply 1 application topically 2 (two) times daily. Applied to back of beck for irritation 07/20/17  Yes Raylene Everts, MD  HYDROcodone-acetaminophen (NORCO/VICODIN) 5-325 MG tablet One tablet every six hours as needed for acute pain.  Must last 14 days. 09/05/18  Yes Sanjuana Kava, MD  Iron Polysacch Cmplx-B12-FA 150-0.025-1 MG CAPS Take 1 tablet by mouth daily. 05/17/18  Yes Higgs, Mathis Dad, MD  levothyroxine (SYNTHROID, LEVOTHROID) 200 MCG tablet TAKE 1 TABLET DAILY BEFORE BREAKFAST. Patient taking differently: Take 200 mcg by mouth daily before breakfast.  06/08/17  Yes Fayrene Helper, MD  metoprolol succinate (TOPROL-XL) 25 MG 24 hr tablet Take 1.5  tablets (37.5 mg total) by mouth daily. 01/04/18  Yes Branch, Alphonse Guild, MD  omeprazole (PRILOSEC) 20 MG capsule TAKE 1 CAPSULE BY MOUTH ONCE DAILY. Patient taking differently: Take 20 mg by mouth daily.  11/19/17  Yes Carlis Stable, NP  tamsulosin (FLOMAX) 0.4 MG CAPS capsule TAKE ONE CAPSULE BY MOUTH ONCE DAILY. Patient taking differently: Take 0.4 mg by mouth daily.  11/08/17  Yes Raylene Everts, MD  ULORIC 40 MG tablet TAKE 1 TABLET BY MOUTH ONCE A DAY. Patient taking differently: Take 40 mg by mouth daily.  12/21/17  Yes Raylene Everts, MD     Allergies:     Allergies  Allergen Reactions  . Aspirin Other (See Comments)    REACTION: Diverticular Bleed     Physical Exam:   Vitals  Blood pressure 123/78, pulse 98, temperature 99.6 F (37.6 C), temperature source Oral, resp. rate 20, height 6' (1.829 m), weight 120.2 kg, SpO2 98 %.  Physical Examination: General appearance - alert,  and in no distress  Mental status - alert, oriented to person, place, and time,  Eyes - sclera anicteric Neck - supple, no JVD elevation , Chest - clear  to auscultation bilaterally, symmetrical air movement, RR 24 earlier Heart - S1 and S2 normal, regular , HR 120s earlier Abdomen - soft, nontender, nondistended, no masses or organomegaly, no CVA area tenderness Neurological - screening mental status exam normal, neck supple without rigidity, cranial nerves II through XII intact, DTR's normal and symmetric Extremities - no pedal edema noted, intact peripheral pulses Skin - warm, dry     Data Review:    CBC Recent Labs  Lab 09/22/18 0802  WBC 13.4*  HGB 12.9*  HCT  40.9  PLT 215  MCV 96.0  MCH 30.3  MCHC 31.5  RDW 13.0  LYMPHSABS 1.7  MONOABS 1.6*  EOSABS 0.1  BASOSABS 0.0   ------------------------------------------------------------------------------------------------------------------  Chemistries  Recent Labs  Lab 09/22/18 0802  NA 133*  K 4.3  CL 96*  CO2 25   GLUCOSE 115*  BUN 24*  CREATININE 2.06*  CALCIUM 8.8*  AST 25  ALT 23  ALKPHOS 49  BILITOT 1.3*   ------------------------------------------------------------------------------------------------------------------ estimated creatinine clearance is 45.3 mL/min (A) (by C-G formula based on SCr of 2.06 mg/dL (H)). ------------------------------------------------------------------------------------------------------------------ No results for input(s): TSH, T4TOTAL, T3FREE, THYROIDAB in the last 72 hours.  Invalid input(s): FREET3   Coagulation profile No results for input(s): INR, PROTIME in the last 168 hours. ------------------------------------------------------------------------------------------------------------------- No results for input(s): DDIMER in the last 72 hours. -------------------------------------------------------------------------------------------------------------------  Cardiac Enzymes Recent Labs  Lab 09/22/18 0802  TROPONINI <0.03   ------------------------------------------------------------------------------------------------------------------    Component Value Date/Time   BNP 26.0 09/22/2018 0802     ---------------------------------------------------------------------------------------------------------------  Urinalysis    Component Value Date/Time   COLORURINE YELLOW 09/22/2018 0800   APPEARANCEUR HAZY (A) 09/22/2018 0800   LABSPEC 1.020 09/22/2018 0800   PHURINE 5.0 09/22/2018 0800   GLUCOSEU NEGATIVE 09/22/2018 0800   HGBUR MODERATE (A) 09/22/2018 0800   HGBUR negative 05/15/2008 0830   BILIRUBINUR NEGATIVE 09/22/2018 0800   KETONESUR NEGATIVE 09/22/2018 0800   PROTEINUR 30 (A) 09/22/2018 0800   UROBILINOGEN 0.2 09/11/2014 1454   NITRITE NEGATIVE 09/22/2018 0800   LEUKOCYTESUR MODERATE (A) 09/22/2018 0800    ----------------------------------------------------------------------------------------------------------------    Imaging Results:    Dg Chest 2 View  Result Date: 09/22/2018 CLINICAL DATA:  Nonproductive cough. EXAM: CHEST - 2 VIEW COMPARISON:  09/13/2018. FINDINGS: Mediastinum and hilar structures normal. Heart size normal. Metallic densities noted over the left upper abdomen unchanged. Mild bibasilar atelectasis. No pleural effusion or pneumothorax. IMPRESSION: Mild bibasilar atelectasis.  Exam otherwise unremarkable. Electronically Signed   By: Marcello Moores  Register   On: 09/22/2018 07:58    Radiological Exams on Admission: Dg Chest 2 View  Result Date: 09/22/2018 CLINICAL DATA:  Nonproductive cough. EXAM: CHEST - 2 VIEW COMPARISON:  09/13/2018. FINDINGS: Mediastinum and hilar structures normal. Heart size normal. Metallic densities noted over the left upper abdomen unchanged. Mild bibasilar atelectasis. No pleural effusion or pneumothorax. IMPRESSION: Mild bibasilar atelectasis.  Exam otherwise unremarkable. Electronically Signed   By: Marcello Moores  Register   On: 09/22/2018 07:58    DVT Prophylaxis -SCD /heparin AM Labs Ordered, also please review Full Orders  Family Communication: Admission, patients condition and plan of care including tests being ordered have been discussed with the patient who indicate understanding and agree with the plan   Code Status - Full Code  Likely DC to  home  Condition   stable  Roxan Hockey M.D on 09/22/2018 at 6:28 PM Go to [email or web address] -  for contact info  [facility] - Office  [phone]

## 2018-09-22 NOTE — ED Provider Notes (Signed)
Emergency Department Provider Note   I have reviewed the triage vital signs and the nursing notes.   HISTORY  Chief Complaint Cough   HPI [name] is a 68 y.o. male with PMH of GERD, h/o substance abuse, and HTN resents to the emergency department with generalized weakness and cough.  Symptoms have been ongoing for the past 2 days.  He is not experiencing any chest pain or heart palpitations.  He reports he is feeling dehydrated.  No abdominal discomfort, vomiting, diarrhea.  No reported fevers or chills.  Patient states "I just do not feel good."  No radiation of symptoms or other modifying factors.  No sick contacts. Cough is dry.   Past Medical History:  Diagnosis Date  . Anemia    FeDA: GASTRIC POLYPS, B12; TCS 2008 EGD 2009, 2008-HB 11.1 MCV 83.6 CR 1.22, 2009 FERRITIN 102-22  . B12 deficiency   . Carcinoid tumor determined by biopsy of stomach 09/25/2014  . Chronic knee pain   . Diverticulosis of colon    Lower GI bleed 2008  . Essential hypertension   . GERD (gastroesophageal reflux disease)   . Gout   . Hepatomegaly    Hepatic steatosis  . History of alcohol abuse   . History of septic arthritis   . Hypothyroidism   . PUD   . Substance abuse [facility])     Patient Active Problem List   Diagnosis Date Noted  . UTI (urinary tract infection) 09/22/2018  . Left inguinal hernia 09/20/2018  . S/P total knee replacement, left 06/29/06 01/27/2018  . Hyperlipidemia 10/23/2016  . Mild intellectual disabilities (CODE) 09/29/2016  . Personal history of gout 09/22/2016  . Literacy level of illiterate 09/22/2016  . S/P TKR (total knee replacement), right 09/18/08 09/22/2016  . Carcinoid tumor determined by biopsy of stomach 04/10/2016  . Tubulovillous adenoma of colon 09/12/2015  . Neuroendocrine tumor 09/25/2014  . Elevated LFTs 07/17/2014  . Iron deficiency anemia 07/29/2010  . Fatty liver 07/29/2010  . ABUSE, ALCOHOL, UNSPECIFIED 11/30/2008  . Osteoarthrosis  involving lower leg 08/20/2008  . Essential hypertension 10/20/2006  . B12 deficiency 08/15/2006  . GERD 08/15/2006  . IBS 08/15/2006  . BPH without obstruction/lower urinary tract symptoms 08/15/2006  . OSTEOARTHRITIS 08/15/2006  . DEGENERATION, DISC NOS 08/15/2006    Past Surgical History:  Procedure Laterality Date  . BIOPSY  08/14/2014   Procedure: BIOPSY;  Surgeon: Danie Binder, MD;  Location: [facility] ORS;  Service: Endoscopy;;  . BIOPSY  03/12/2015   Procedure: BIOPSY;  Surgeon: Danie Binder, MD;  Location: [facility];  Service: Endoscopy;;  . BIOPSY  04/21/2016   Procedure: BIOPSY;  Surgeon: Danie Binder, MD;  Location: [facility];  Service: Endoscopy;;  bx's of antrum, body of stomach, fundus, and cardia  . CHOLECYSTECTOMY    . COLONOSCOPY  2008 [facility]   LGIB 2o to TICS, prep good  . COLONOSCOPY WITH PROPOFOL N/A 08/14/2014   SLF: 1. Four large colon polyps removed. 2. The left colon is redundant 3. Moderate diverticulosis throughout teh entire examined colon  . COLONOSCOPY WITH PROPOFOL N/A 11/06/2014   SLF: 8 small polyps removed. One large pedunculated polyp removed from the ascending colon, tubulovillous and tubular adenomas. Next colonoscopy March 2019  . COLONOSCOPY WITH PROPOFOL N/A 01/04/2018   Procedure: COLONOSCOPY WITH PROPOFOL;  Surgeon: Danie Binder, MD;  Location: [facility];  Service: Endoscopy;  Laterality: N/A;  10:00am  . ESOPHAGOGASTRODUODENOSCOPY  12/08/2007  YNW:GNFA gastric polyps seen in the cardia and body of the stomach/Normal esophagus without evidence of Barrett's, mass, erosion/Normal duodenal bulb and second portion of the duodenum. Benign bx.  . ESOPHAGOGASTRODUODENOSCOPY (EGD) WITH PROPOFOL N/A 08/14/2014   SLF: 1. Heme postive stools due to gastric and colon polyps 2. Multiple gastric   . ESOPHAGOGASTRODUODENOSCOPY (EGD) WITH PROPOFOL N/A 03/12/2015   SLF: Multiple gastric nodules seen in gasric body/antrum. 2. Non-erosive gastritis (  inflammation) was found in the gastric antrum.   . ESOPHAGOGASTRODUODENOSCOPY (EGD) WITH PROPOFOL N/A 10/08/2015   Procedure: ESOPHAGOGASTRODUODENOSCOPY (EGD) WITH PROPOFOL;  Surgeon: Danie Binder, MD;  [facility];  Service: Endoscopy;  Laterality: N/A;  0945  . ESOPHAGOGASTRODUODENOSCOPY (EGD) WITH PROPOFOL N/A 04/21/2016   Procedure: ESOPHAGOGASTRODUODENOSCOPY (EGD) WITH PROPOFOL;  Surgeon: Danie Binder, MD;  Location[facility];  Service: Endoscopy;  Laterality: N/A;  730   . HAND SURGERY Right    fracture repair with plates  . JOINT REPLACEMENT Bilateral    knees  . POLYPECTOMY  08/14/2014   Procedure: POLYPECTOMY;  Surgeon: Danie Binder, MD;  Location: [facility];  Service: Endoscopy;;  . POLYPECTOMY N/A 11/06/2014   Procedure: POLYPECTOMY;  Surgeon: Danie Binder, MD;  Location: [facility];  Service: Endoscopy;  Laterality: N/A;  Transverse Colon x3, Ascending Colon x2, Descending Colon x3  . REPLACEMENT TOTAL KNEE BILATERAL    . UPPER GASTROINTESTINAL ENDOSCOPY  APR 2009   INFLAMED HYPERPLASTIC POLYPS, CHRONIC GASTRITIS   Allergies Aspirin  Family History  Problem Relation Age of Onset  . Diabetes Mother   . Renal Disease Mother        failure  . Diabetes Father        'old age'  . Hypertension Sister   . Hypertension Brother   . Diabetes Brother   . Diabetes Sister   . Diabetes Sister   . Diabetes Brother   . Hypertension Brother   . Diabetes Brother        right BKA  . Kidney failure Brother        on dialysis  . Kidney failure Brother        on dialysis  . Diabetes Brother        bilateral BKA  . Colon polyps Neg Hx   . Colon cancer Neg Hx   . Pancreatic disease Neg Hx     Social History Social History   Tobacco Use  . Smoking status: Never Smoker  . Smokeless tobacco: Never Used  . Tobacco comment: Quit x 10 years/ never smoked on regular basis  Substance Use Topics  . Alcohol use: Yes    Alcohol/week: 2.0 standard drinks    Types: 2  Shots of liquor per week    Comment: drinks on weekends, gin/vodka 1/5th.  . Drug use: No    Review of Systems  Constitutional: No fever/chills. Positive generalized weakness.  Eyes: No visual changes. ENT: No sore throat. Cardiovascular: Denies chest pain. Respiratory: Denies shortness of breath. Positive cough.  Gastrointestinal: No abdominal pain.  No nausea, no vomiting.  No diarrhea.  No constipation. Genitourinary: Negative for dysuria. Musculoskeletal: Negative for back pain. Skin: Negative for rash. Neurological: Negative for headaches, focal weakness or numbness.  10-point ROS otherwise negative.  ____________________________________________   PHYSICAL EXAM:  VITAL SIGNS: ED Triage Vitals  Enc Vitals Group     BP 09/22/18 0718 121/82     Pulse Rate 09/22/18 0718 (!) 120     Resp  09/22/18 0718 16     Temp 09/22/18 0718 99.6 F (37.6 C)     Temp Source 09/22/18 0718 Oral     SpO2 09/22/18 0718 97 %     Weight 09/22/18 0714 265 lb (120.2 kg)     Height 09/22/18 0714 6' (1.829 m)     Pain Score 09/22/18 0714 0   Constitutional: Alert and oriented. Well appearing and in no acute distress. Eyes: Conjunctivae are normal. Head: Atraumatic. Nose: No congestion/rhinnorhea. Mouth/Throat: Mucous membranes are moist.  Neck: No stridor.   Cardiovascular: Tachycardia. Good peripheral circulation. Grossly normal heart sounds.   Respiratory: Normal respiratory effort.  No retractions. Lungs CTAB. Gastrointestinal: Soft and nontender. No distention.  Musculoskeletal: No lower extremity tenderness nor edema. No gross deformities of extremities. Neurologic:  Normal speech and language. No gross focal neurologic deficits are appreciated.  Skin:  Skin is warm, dry and intact. No rash noted.  ____________________________________________   LABS (all labs ordered are listed, but only abnormal results are displayed)  Labs Reviewed  COMPREHENSIVE METABOLIC PANEL - Abnormal;  Notable for the following components:      Result Value   Sodium 133 (*)    Chloride 96 (*)    Glucose, Bld 115 (*)    BUN 24 (*)    Creatinine, Ser 2.06 (*)    Calcium 8.8 (*)    Total Protein 8.3 (*)    Albumin 3.4 (*)    Total Bilirubin 1.3 (*)    GFR calc non Af Amer 32 (*)    GFR calc Af Amer 37 (*)    All other components within normal limits  CBC WITH DIFFERENTIAL/PLATELET - Abnormal; Notable for the following components:   WBC 13.4 (*)    Hemoglobin 12.9 (*)    Neutro Abs 9.8 (*)    Monocytes Absolute 1.6 (*)    Abs Immature Granulocytes 0.10 (*)    All other components within normal limits  URINALYSIS, ROUTINE W REFLEX MICROSCOPIC - Abnormal; Notable for the following components:   APPearance HAZY (*)    Hgb urine dipstick MODERATE (*)    Protein, ur 30 (*)    Leukocytes, UA MODERATE (*)    WBC, UA >50 (*)    Bacteria, UA RARE (*)    All other components within normal limits  CULTURE, BLOOD (ROUTINE X 2)  CULTURE, BLOOD (ROUTINE X 2)  URINE CULTURE  LACTIC ACID, PLASMA  LACTIC ACID, PLASMA  INFLUENZA PANEL BY PCR (TYPE A & B)  TROPONIN I  BRAIN NATRIURETIC PEPTIDE  HIV ANTIBODY (ROUTINE TESTING W REFLEX)   ____________________________________________  EKG   EKG Interpretation  Date/Time:  Thursday September 22 2018 08:03:57 EST Ventricular Rate:  119 PR Interval:    QRS Duration: 80 QT Interval:  308 QTC Calculation: 434 R Axis:   -20 Text Interpretation:  Sinus tachycardia Borderline left axis deviation No STEMI.  Reconfirmed by Nanda Quinton [phone]) on 09/22/2018 8:35:03 AM       ____________________________________________  RADIOLOGY  Dg Chest 2 View  Result Date: 09/22/2018 CLINICAL DATA:  Nonproductive cough. EXAM: CHEST - 2 VIEW COMPARISON:  09/13/2018. FINDINGS: Mediastinum and hilar structures normal. Heart size normal. Metallic densities noted over the left upper abdomen unchanged. Mild bibasilar atelectasis. No pleural effusion or  pneumothorax. IMPRESSION: Mild bibasilar atelectasis.  Exam otherwise unremarkable. Electronically Signed   By: Marcello Moores  Register   On: 09/22/2018 07:58    ____________________________________________   PROCEDURES  Procedure(s) performed:   Procedures  None ____________________________________________   INITIAL IMPRESSION / ASSESSMENT AND PLAN / ED COURSE  Pertinent labs & imaging results that were available during my care of the patient were reviewed by me and considered in my medical decision making (see chart for details).  Patient presents with generalized weakness and cough.  No other significant symptoms on history.  Exam shows a sinus tachycardia without fever or hypotension.  Chest x-ray shows bibasilar atelectasis.  Patient does not appear volume overloaded.  EKG shows a sinus tachycardia.  Plan for IV fluids and infectious work-up.  I will add on troponin and BNP with tachycardia and nonspecific weakness symptoms with concern for possible anginal equivalent.   Heart rate downtrending with IV fluids.  Urine analysis concerning for UTI.  Patient has a leukocytosis but normal lactate.  He does have some continued mild tachycardia and generalized weakness/fatigue.  Given his fatigue symptoms and continued tachycardia plan for IV fluids overnight along with antibiotics and reassessment in the morning.  Discussed patient's case with Hospitalist to request admission. Patient and family (if present) updated with plan. Care transferred to Hospitalist service.  I reviewed all nursing notes, vitals, pertinent old records, EKGs, labs, imaging (as available).  ____________________________________________  FINAL CLINICAL IMPRESSION(S) / ED DIAGNOSES  Final diagnoses:  Acute cystitis without hematuria  Generalized weakness     MEDICATIONS GIVEN DURING THIS VISIT:  Medications  atorvastatin (LIPITOR) tablet 20 mg (has no administration in time range)  levothyroxine (SYNTHROID,  LEVOTHROID) tablet 200 mcg (has no administration in time range)  tamsulosin (FLOMAX) capsule 0.4 mg (has no administration in time range)  pantoprazole (PROTONIX) EC tablet 40 mg (has no administration in time range)  febuxostat (ULORIC) tablet 40 mg (has no administration in time range)  metoprolol succinate (TOPROL-XL) 24 hr tablet 37.5 mg (has no administration in time range)  Iron Polysacch Cmplx-B12-FA 150-0.025-1 MG CAPS 1 tablet (has no administration in time range)  amLODipine (NORVASC) tablet 5 mg (has no administration in time range)  HYDROcodone-acetaminophen (NORCO/VICODIN) 5-325 MG per tablet 1 tablet (has no administration in time range)  sodium chloride flush (NS) 0.9 % injection 3 mL (3 mLs Intravenous Not Given 09/22/18 1101)  sodium chloride flush (NS) 0.9 % injection 3 mL (has no administration in time range)  0.9 %  sodium chloride infusion (has no administration in time range)  acetaminophen (TYLENOL) tablet 650 mg (has no administration in time range)    Or  acetaminophen (TYLENOL) suppository 650 mg (has no administration in time range)  traZODone (DESYREL) tablet 50 mg (has no administration in time range)  polyethylene glycol (MIRALAX / GLYCOLAX) packet 17 g (has no administration in time range)  ondansetron (ZOFRAN) tablet 4 mg (has no administration in time range)    Or  ondansetron (ZOFRAN) injection 4 mg (has no administration in time range)  albuterol (PROVENTIL) (2.5 MG/3ML) 0.083% nebulizer solution 2.5 mg (has no administration in time range)  heparin injection 5,000 Units (has no administration in time range)  0.9 %  sodium chloride infusion ( Intravenous New Bag/Given 09/22/18 1130)  cefTRIAXone (ROCEPHIN) 1 g in sodium chloride 0.9 % 100 mL IVPB (has no administration in time range)  sodium chloride 0.9 % bolus 1,000 mL (0 mLs Intravenous Stopped 09/22/18 0903)  cefTRIAXone (ROCEPHIN) 1 g in sodium chloride 0.9 % 100 mL IVPB (0 g Intravenous Stopped 09/22/18  1027)  sodium chloride 0.9 % bolus 500 mL (500 mLs Intravenous New Bag/Given 09/22/18 1101)    Note:  This  document was prepared using Systems analyst and may include unintentional dictation errors.  Nanda Quinton, MD Emergency Medicine    Long, Wonda [facility], MD 09/22/18 [phone]

## 2018-09-22 NOTE — ED Triage Notes (Signed)
Pt c/o non-productive cough, generalized weakness, and chills x 3 days.

## 2018-09-23 DIAGNOSIS — I10 Essential (primary) hypertension: Secondary | ICD-10-CM | POA: Diagnosis not present

## 2018-09-23 DIAGNOSIS — N3001 Acute cystitis with hematuria: Secondary | ICD-10-CM | POA: Diagnosis not present

## 2018-09-23 DIAGNOSIS — N3000 Acute cystitis without hematuria: Secondary | ICD-10-CM | POA: Diagnosis not present

## 2018-09-23 LAB — HIV ANTIBODY (ROUTINE TESTING W REFLEX): HIV Screen 4th Generation wRfx: NONREACTIVE

## 2018-09-23 LAB — BASIC METABOLIC PANEL
Anion gap: 7 (ref 5–15)
BUN: 19 mg/dL (ref 8–23)
CHLORIDE: 105 mmol/L (ref 98–111)
CO2: 24 mmol/L (ref 22–32)
CREATININE: 1.53 mg/dL — AB (ref 0.61–1.24)
Calcium: 8.1 mg/dL — ABNORMAL LOW (ref 8.9–10.3)
GFR calc Af Amer: 53 mL/min — ABNORMAL LOW (ref >60)
GFR calc non Af Amer: 46 mL/min — ABNORMAL LOW (ref >60)
Glucose, Bld: 97 mg/dL (ref 70–99)
Potassium: 4.3 mmol/L (ref 3.5–5.1)
Sodium: 136 mmol/L (ref 135–145)

## 2018-09-23 LAB — CBC
HEMATOCRIT: 34.3 % — AB (ref 39.0–52.0)
Hemoglobin: 10.6 g/dL — ABNORMAL LOW (ref 13.0–17.0)
MCH: 30 pg (ref 26.0–34.0)
MCHC: 30.9 g/dL (ref 30.0–36.0)
MCV: 97.2 fL (ref 80.0–100.0)
Platelets: 204 10*3/uL (ref 150–400)
RBC: 3.53 MIL/uL — ABNORMAL LOW (ref 4.22–5.81)
RDW: 13.1 % (ref 11.5–15.5)
WBC: 8.1 10*3/uL (ref 4.0–10.5)
nRBC: 0 % (ref 0.0–0.2)

## 2018-09-23 NOTE — Progress Notes (Signed)
Patient Demographics:    [name], is a 68 y.o. male, DOB - 15-Dec-1950, IDP:[id number]  Admit date - 09/22/2018   Admitting Physician Dereona Kolodny Denton Brick, MD  Outpatient Primary MD for the patient is Philip Gravel, MD  LOS - 0   Chief Complaint  Patient presents with  . Cough        Subjective:    [name] today has no emesis,  No chest pain, hematuria has resolved, urinary frequency and some dysuria persist, no further fevers  Assessment  & Plan :    Principal Problem:   UTI (urinary tract infection) Active Problems:   Alcohol abuse   Essential hypertension   BPH without obstruction/lower urinary tract symptoms   Literacy level of illiterate   AKI (acute kidney injury) ([place])   1) UTI----patient presented with urinary symptoms, intermittent hematuria, tachycardia, tachypnea and leukocytosis--with IV Rocephin patient appears to be improving, no further hematuria, white count is down to 8.1 from 13.4, however urine culture results not available in order to direct choice of antibiotic therapy at this time  2)BPH with LUTs--Flomax as ordered  3) hypothyroidism--- continue levothyroxine 200 mcg daily  4)HTN---  stable, continue Toprol-XL 37.5 mg daily  5)HLD--- stable , continue Lipitor 20 mg every afternoon  6)H/o EtOH abuse--- patient denies recent EtOH use, give folic acid, thiamine, use lorazepam per CIWA protocol  7)AKI--creatinine was 2.0 on admission from 1.45 on 09/13/2018, avoid nephrotoxic agents, with hydration creatinine is down to 1.53  Disposition/Need for in-Hospital Stay- patient unable to be discharged at this time due to awaiting urine culture data to help guide antibiotic choice  Code Status : Full  Family Communication:   na   Disposition Plan  : home  Consults  :  na  DVT Prophylaxis  :    - Heparin    Lab Results  Component Value Date   PLT 204 09/23/2018     Inpatient Medications  Scheduled Meds: . amLODipine  5 mg Oral Daily  . atorvastatin  20 mg Oral Daily  . febuxostat  40 mg Oral Daily  . ferrous [id number] C-folic acid  1 capsule Oral Daily  . folic acid  1 mg Oral Daily  . heparin  5,000 Units Subcutaneous Q8H  . levothyroxine  200 mcg Oral QAC breakfast  . metoprolol succinate  37.5 mg Oral Daily  . multivitamin with minerals  1 tablet Oral Daily  . pantoprazole  40 mg Oral Daily  . sodium chloride flush  3 mL Intravenous Q12H  . tamsulosin  0.4 mg Oral Daily  . thiamine  100 mg Oral Daily   Or  . thiamine  100 mg Intravenous Daily   Continuous Infusions: . sodium chloride    . sodium chloride 50 mL/hr at 09/23/18 1016  . cefTRIAXone (ROCEPHIN)  IV 1 g (09/23/18 1027)   PRN Meds:.sodium chloride, acetaminophen **OR** acetaminophen, albuterol, HYDROcodone-acetaminophen, LORazepam **OR** LORazepam, ondansetron **OR** ondansetron (ZOFRAN) IV, polyethylene glycol, sodium chloride flush, traZODone    Anti-infectives (From admission, onward)   Start     Dose/Rate Route Frequency Ordered Stop   09/23/18 0900  cefTRIAXone (ROCEPHIN) 1 g in sodium chloride 0.9 % 100 mL IVPB     1 g 200 mL/hr over  30 Minutes Intravenous Every 24 hours 09/22/18 1031     09/22/18 0930  cefTRIAXone (ROCEPHIN) 1 g in sodium chloride 0.9 % 100 mL IVPB     1 g 200 mL/hr over 30 Minutes Intravenous  Once 09/22/18 0926 09/22/18 1027        Objective:   Vitals:   09/22/18 2143 09/23/18 0000 09/23/18 0601 09/23/18 1340  BP: 113/75 113/75 125/83 110/78  Pulse: 86 83 84 91  Resp: (!) 22  16 20   Temp: 99.1 F (37.3 C)  99.3 F (37.4 C) 98.6 F (37 C)  TempSrc: Oral  Oral   SpO2: 96%  99% 95%  Weight:      Height:        Wt Readings from Last 3 Encounters:  09/22/18 120.2 kg  09/20/18 112.2 kg  09/13/18 117.9 kg     Intake/Output Summary (Last 24 hours) at 09/23/2018 1829 Last data filed at 09/23/2018 0900 Gross per 24 hour   Intake 1504.1 ml  Output 550 ml  Net 954.1 ml     Physical Exam Patient is examined daily including today on 09/23/18 , exams remain the same as of yesterday except that has changed   Gen:- Awake Alert, no acute distress HEENT:- [place].AT, No sclera icterus Neck-Supple Neck,No JVD,.  Lungs-  CTAB , fair symmetrical air movement CV- S1, S2 normal, regular  Abd-  +ve B.Sounds, Abd Soft, No tenderness, no CVA tenderness Extremity/Skin:- No  edema, pedal pulses present  Psych-affect is appropriate, oriented x3 Neuro-no new focal deficits, no tremors   Data Review:   Micro Results Recent Results (from the past 240 hour(s))  Culture, blood (routine x 2)     Status: None (Preliminary result)   Collection Time: 09/22/18  8:02 AM  Result Value Ref Range Status   Specimen Description BLOOD RIGHT ANTECUBITAL DRAWN BY RN  Final   Special Requests   Final    BOTTLES DRAWN AEROBIC AND ANAEROBIC Blood Culture adequate volume   Culture   Final    NO GROWTH < 24 HOURS Performed at [facility], [address] [place], [place] [address]    Report Status PENDING  Incomplete  Culture, blood (routine x 2)     Status: None (Preliminary result)   Collection Time: 09/22/18  8:03 AM  Result Value Ref Range Status   Specimen Description BLOOD LEFT ARM  Final   Special Requests   Final    BOTTLES DRAWN AEROBIC AND ANAEROBIC Blood Culture adequate volume   Culture   Final    NO GROWTH < 24 HOURS Performed at [facility], [address], [place], [place] [address]    Report Status PENDING  Incomplete    Radiology Reports Dg Chest 2 View  Result Date: 09/22/2018 CLINICAL DATA:  Nonproductive cough. EXAM: CHEST - 2 VIEW COMPARISON:  09/13/2018. FINDINGS: Mediastinum and hilar structures normal. Heart size normal. Metallic densities noted over the left upper abdomen unchanged. Mild bibasilar atelectasis. No pleural effusion or pneumothorax. IMPRESSION: Mild bibasilar atelectasis.  Exam otherwise  unremarkable. Electronically Signed   By: Marcello Moores  Register   On: 09/22/2018 07:58   Dg Chest 2 View  Result Date: 09/13/2018 CLINICAL DATA:  Fever, nonproductive cough. EXAM: CHEST - 2 VIEW COMPARISON:  Radiographs of February 18, 2017. FINDINGS: The heart size and mediastinal contours are within normal limits. Both lungs are clear. The visualized skeletal structures are unremarkable. IMPRESSION: No active cardiopulmonary disease. Electronically Signed   By: Marijo Conception,  M.D.   On: 09/13/2018 16:27   Ct Head Wo Contrast  Result Date: 08/25/2018 CLINICAL DATA:  Dizziness EXAM: CT HEAD WITHOUT CONTRAST TECHNIQUE: Contiguous axial images were obtained from the base of the skull through the vertex without intravenous contrast. COMPARISON:  None. FINDINGS: Brain: There is mild diffuse atrophy. There is no intracranial mass, hemorrhage, extra-axial fluid collection, or midline shift. There is patchy small vessel disease throughout the centra semiovale bilaterally. There is small vessel disease in the anterior limb of the right internal capsule. There is mild small vessel disease in each thalamus. There is no evident acute infarct. Vascular: There is no hyperdense vessel. There is calcification in each distal vertebral artery and carotid siphon region. Skull: The bony calvarium appears intact. Sinuses/Orbits: There is mucosal thickening in several ethmoid air cells. There is mucosal thickening in the lateral right maxillary antrum. Other visualized paranasal sinuses are clear. Orbits appear symmetric bilaterally. Other: There is opacification in several inferior mastoid air cells on the right. Mastoids elsewhere appear clear. IMPRESSION: Atrophy with supratentorial small vessel disease. No acute infarct evident. No mass or hemorrhage. There are foci of arterial vascular calcification. There are areas of paranasal sinus disease. There is inferior right mastoid disease. Electronically Signed   By: Lowella Grip  III M.D.   On: 08/25/2018 20:39     CBC Recent Labs  Lab 09/22/18 0802 09/23/18 0415  WBC 13.4* 8.1  HGB 12.9* 10.6*  HCT 40.9 34.3*  PLT 215 204  MCV 96.0 97.2  MCH 30.3 30.0  MCHC 31.5 30.9  RDW 13.0 13.1  LYMPHSABS 1.7  --   MONOABS 1.6*  --   EOSABS 0.1  --   BASOSABS 0.0  --     Chemistries  Recent Labs  Lab 09/22/18 0802 09/23/18 0415  NA 133* 136  K 4.3 4.3  CL 96* 105  CO2 25 24  GLUCOSE 115* 97  BUN 24* 19  CREATININE 2.06* 1.53*  CALCIUM 8.8* 8.1*  AST 25  --   ALT 23  --   ALKPHOS 49  --   BILITOT 1.3*  --    ------------------------------------------------------------------------------------------------------------------ No results for input(s): CHOL, HDL, LDLCALC, TRIG, CHOLHDL, LDLDIRECT in the last 72 hours.  Lab Results  Component Value Date   HGBA1C 5.6 10/16/2016   ------------------------------------------------------------------------------------------------------------------ No results for input(s): TSH, T4TOTAL, T3FREE, THYROIDAB in the last 72 hours.  Invalid input(s): FREET3 ------------------------------------------------------------------------------------------------------------------ No results for input(s): VITAMINB12, FOLATE, FERRITIN, TIBC, IRON, RETICCTPCT in the last 72 hours.  Coagulation profile No results for input(s): INR, PROTIME in the last 168 hours.  No results for input(s): DDIMER in the last 72 hours.  Cardiac Enzymes Recent Labs  Lab 09/22/18 0802  TROPONINI <0.03   ------------------------------------------------------------------------------------------------------------------    Component Value Date/Time   BNP 26.0 09/22/2018 0802     Aicha Clingenpeel M.D on 09/23/2018 at 6:29 PM  Go to [email or web address] - for contact info  [facility] - Office  [phone]

## 2018-09-23 NOTE — Progress Notes (Signed)
Central Monitoring called and made this nurse aware of non sustaining VTach, patient voices no complaints of chest pain not shortness of breath. Patient has been up to the bathroom once today and resting in the bed. Patient does not voice any other complaints. Dr. Denton Brick made aware.

## 2018-09-23 NOTE — Care Management Note (Signed)
Case Management Note  Patient Details  Name: [name] MRN: [id number] Date of Birth: 04-Nov-1950  Subjective/Objective:      Admitted from home with UTI. Pt lives alone and has aid services 7 days a week 8 hrs per day. Pt uses a can or RW with ambulation. He has communicates no needs. Will be able to get medication and will have transportation after DC.               Action/Plan: DC home with self care. No CM needs noted at this time.   Expected Discharge Date:  09/24/18               Expected Discharge Plan:  Home/Self Care  In-House Referral:  NA  Discharge planning Services  CM Consult  Post Acute Care Choice:  NA Choice offered to:  NA  Status of Service:  Completed, signed off  Sherald Barge, RN 09/23/2018, 11:13 AM

## 2018-09-23 NOTE — Care Management Obs Status (Signed)
Philip NOTIFICATION   Patient Details  Name: [name] MRN: [id number] Date of Birth: [birth year]   Medicare Observation Status Notification Given:  Yes    Shelda Altes 09/23/2018, 12:55 PM

## 2018-09-24 DIAGNOSIS — N179 Acute kidney failure, unspecified: Secondary | ICD-10-CM

## 2018-09-24 DIAGNOSIS — F1010 Alcohol abuse, uncomplicated: Secondary | ICD-10-CM | POA: Diagnosis not present

## 2018-09-24 DIAGNOSIS — N3000 Acute cystitis without hematuria: Secondary | ICD-10-CM | POA: Diagnosis not present

## 2018-09-24 DIAGNOSIS — I10 Essential (primary) hypertension: Secondary | ICD-10-CM | POA: Diagnosis not present

## 2018-09-24 DIAGNOSIS — N3001 Acute cystitis with hematuria: Secondary | ICD-10-CM | POA: Diagnosis not present

## 2018-09-24 LAB — CBC
HCT: 35.1 % — ABNORMAL LOW (ref 39.0–52.0)
Hemoglobin: 10.7 g/dL — ABNORMAL LOW (ref 13.0–17.0)
MCH: 30.2 pg (ref 26.0–34.0)
MCHC: 30.5 g/dL (ref 30.0–36.0)
MCV: 99.2 fL (ref 80.0–100.0)
PLATELETS: 220 10*3/uL (ref 150–400)
RBC: 3.54 MIL/uL — ABNORMAL LOW (ref 4.22–5.81)
RDW: 12.8 % (ref 11.5–15.5)
WBC: 5.9 10*3/uL (ref 4.0–10.5)
nRBC: 0 % (ref 0.0–0.2)

## 2018-09-24 LAB — BASIC METABOLIC PANEL
Anion gap: 6 (ref 5–15)
BUN: 13 mg/dL (ref 8–23)
CALCIUM: 8.3 mg/dL — AB (ref 8.9–10.3)
CO2: 25 mmol/L (ref 22–32)
Chloride: 107 mmol/L (ref 98–111)
Creatinine, Ser: 1.27 mg/dL — ABNORMAL HIGH (ref 0.61–1.24)
GFR calc Af Amer: >60 mL/min (ref >60)
GFR, EST NON AFRICAN AMERICAN: 57 mL/min — AB (ref >60)
Glucose, Bld: 99 mg/dL (ref 70–99)
Potassium: 4.3 mmol/L (ref 3.5–5.1)
Sodium: 138 mmol/L (ref 135–145)

## 2018-09-24 MED ORDER — TAMSULOSIN HCL 0.4 MG PO CAPS: 0.4000 mg | ORAL_CAPSULE | Freq: Every day | ORAL | 3 refills | Status: DC

## 2018-09-24 MED ORDER — IRON POLYSACCH CMPLX-B12-FA 150-0.025-1 MG PO CAPS: 1.0000 | ORAL_CAPSULE | Freq: Every day | ORAL | 11 refills | Status: DC

## 2018-09-24 MED ORDER — AMLODIPINE BESYLATE 5 MG PO TABS: 5.0000 mg | ORAL_TABLET | Freq: Every day | ORAL | 3 refills | Status: DC

## 2018-09-24 MED ORDER — CEPHALEXIN 500 MG PO CAPS: 500.0000 mg | ORAL_CAPSULE | Freq: Three times a day (TID) | ORAL | 0 refills | Status: DC

## 2018-09-24 MED ORDER — METOPROLOL SUCCINATE ER 25 MG PO TB24: 37.5000 mg | ORAL_TABLET | Freq: Every day | ORAL | 6 refills | Status: DC

## 2018-09-24 MED ORDER — ACETAMINOPHEN 325 MG PO TABS: 650.0000 mg | ORAL_TABLET | Freq: Four times a day (QID) | ORAL | 0 refills | Status: DC | PRN

## 2018-09-24 MED ORDER — TAMSULOSIN HCL 0.4 MG PO CAPS: 0.4000 mg | ORAL_CAPSULE | Freq: Every day | ORAL | 0 refills | Status: DC

## 2018-09-24 MED ORDER — LEVOTHYROXINE SODIUM 200 MCG PO TABS: 200.0000 ug | ORAL_TABLET | Freq: Every day | ORAL | 3 refills | Status: AC

## 2018-09-24 NOTE — Discharge Summary (Addendum)
[name], is a 68 y.o. male  DOB 1951-08-24  MRN [id number].  Admission date:  09/22/2018  Admitting Physician  Roxan Hockey, MD  Discharge Date:  09/24/2018   Primary MD  [name], MD  Recommendations for primary care physician for things to follow:   1)call [phone] on Sunday, 09/25/2018  After 1 PM to get the results of your urine culture and to see if the antibiotics need to be changed  3) take tamsulosin/Flomax as prescribed for prostate and urination problems  4) take all other medications as prescribed  5) follow-up with your primary care physician in less than a week for repeat CBC and BMP blood test  09/25/18 Update::- Urine cx with ESBL E coli----- called patient at [phone] and informed him to pick up Cipro 250 mg bid for 10 days..........the patient Verbalizes understanding  Admission Diagnosis  Acute cystitis without hematuria [N30.00] Generalized weakness [R53.1]   Discharge Diagnosis  Acute cystitis without hematuria [N30.00] Generalized weakness [R53.1]    Principal Problem:   UTI (urinary tract infection) Active Problems:   Alcohol abuse   Essential hypertension   BPH without obstruction/lower urinary tract symptoms   Literacy level of illiterate   AKI (acute kidney injury) ([place])      Past Medical History:  Diagnosis Date  . Anemia    FeDA: GASTRIC POLYPS, B12; TCS 2008 EGD 2009, 2008-HB 11.1 MCV 83.6 CR 1.22, 2009 FERRITIN 102-22  . B12 deficiency   . Carcinoid tumor determined by biopsy of stomach 09/25/2014  . Chronic knee pain   . Diverticulosis of colon    Lower GI bleed 2008  . Essential hypertension   . GERD (gastroesophageal reflux disease)   . Gout   . Hepatomegaly    Hepatic steatosis  . History of alcohol abuse   . History of septic arthritis   . Hypothyroidism   . PUD   . Substance abuse [facility])     Past Surgical History:   Procedure Laterality Date  . BIOPSY  08/14/2014   Procedure: BIOPSY;  Surgeon: Danie Binder, MD;  Location: [facility];  Service: Endoscopy;;  . BIOPSY  03/12/2015   Procedure: BIOPSY;  Surgeon: Danie Binder, MD;  Location: [facility];  Service: Endoscopy;;  . BIOPSY  04/21/2016   Procedure: BIOPSY;  Surgeon: Danie Binder, MD;  Location[facility];  Service: Endoscopy;;  bx's of antrum, body of stomach, fundus, and cardia  . CHOLECYSTECTOMY    . COLONOSCOPY  2008 [facility] DJ   LGIB 2o to TICS, prep good  . COLONOSCOPY WITH PROPOFOL N/A 08/14/2014   SLF: 1. Four large colon polyps removed. 2. The left colon is redundant 3. Moderate diverticulosis throughout teh entire examined colon  . COLONOSCOPY WITH PROPOFOL N/A 11/06/2014   SLF: 8 small polyps removed. One large pedunculated polyp removed from the ascending colon, tubulovillous and tubular adenomas. Next colonoscopy March 2019  . COLONOSCOPY WITH PROPOFOL N/A 01/04/2018   Procedure: COLONOSCOPY WITH PROPOFOL;  Surgeon: Danie Binder,  MD;  [facility];  Service: Endoscopy;  Laterality: N/A;  10:00am  . ESOPHAGOGASTRODUODENOSCOPY  12/08/2007   YCX:KGYJ gastric polyps seen in the cardia and body of the stomach/Normal esophagus without evidence of Barrett's, mass, erosion/Normal duodenal bulb and second portion of the duodenum. Benign bx.  . ESOPHAGOGASTRODUODENOSCOPY (EGD) WITH PROPOFOL N/A 08/14/2014   SLF: 1. Heme postive stools due to gastric and colon polyps 2. Multiple gastric   . ESOPHAGOGASTRODUODENOSCOPY (EGD) WITH PROPOFOL N/A 03/12/2015   SLF: Multiple gastric nodules seen in gasric body/antrum. 2. Non-erosive gastritis ( inflammation) was found in the gastric antrum.   . ESOPHAGOGASTRODUODENOSCOPY (EGD) WITH PROPOFOL N/A 10/08/2015   Procedure: ESOPHAGOGASTRODUODENOSCOPY (EGD) WITH PROPOFOL;  Surgeon: Danie Binder, MD;  [facility];  Service: Endoscopy;  Laterality: N/A;  0945  . ESOPHAGOGASTRODUODENOSCOPY (EGD)  WITH PROPOFOL N/A 04/21/2016   Procedure: ESOPHAGOGASTRODUODENOSCOPY (EGD) WITH PROPOFOL;  Surgeon: Danie Binder, MD;  [facility];  Service: Endoscopy;  Laterality: N/A;  730   . HAND SURGERY Right    fracture repair with plates  . JOINT REPLACEMENT Bilateral    knees  . POLYPECTOMY  08/14/2014   Procedure: POLYPECTOMY;  Surgeon: Danie Binder, MD;  Location: [facility];  Service: Endoscopy;;  . POLYPECTOMY N/A 11/06/2014   Procedure: POLYPECTOMY;  Surgeon: Danie Binder, MD;  Location: [facility];  Service: Endoscopy;  Laterality: N/A;  Transverse Colon x3, Ascending Colon x2, Descending Colon x3  . REPLACEMENT TOTAL KNEE BILATERAL    . UPPER GASTROINTESTINAL ENDOSCOPY  APR 2009   INFLAMED HYPERPLASTIC POLYPS, CHRONIC GASTRITIS       HPI  from the history and physical done on the day of admission:    [name]  is a 68 y.o. male with past medical history relevant for BPH, alcohol abuse, GERD,  and HTN presents to the ED with 4-day history of dysuria, intermittent hematuria and urinary frequency as well as marked malaise and fatigue and dry cough......  Denies fevers, no chills, had nausea but no emesis, no diarrhea  In ED patient is found to be tachycardic, slightly tachypneic with UA suggestive of a UTI  In ED--- she received IV fluids and IV Rocephin, however tachycardia persisted and he was found to have a leukocytosis of 13.4, please note on 09/13/2018 his white count was 8.9  Patient was also found to have elevated creatinine in the ED with creatinine up to 2.0 from 1.45 on 09/13/2018  Denies any flank pain  Lactic acid and  influenza test were negative,    Hospital Course:     1)E coliUTI----patient presented with urinary symptoms, intermittent hematuria, tachycardia, tachypnea and leukocytosis-treated with IV Rocephin , improved overall , urine culture sensitivity not available yet however based on recent urine culture from 08/25/2018 will discharge home  on p.o. Keflex, , patient will call me back on 09/25/2018 to get sensitivities to urine culture and possibly make adjustments to his antibiotic regimen based on urine culture and sensitivity report ... Leukocytosis resolved, no fevers, no chills, no nausea or vomiting, eating and drinking well, voiding well, no further hematuria or dysuria   2)BPH with LUTs--stable, continue Flomax , consider urology follow-up as outpatient given recurrent UTIs3)hypothyroidism---continue levothyroxine 200 mcg daily  4)HTN--- stable, continue Toprol-XL 37.5 mg daily  5)HLD---stable , continue Lipitor 20 mg every afternoon  6)H/oEtOH abuse---patient denies recent EtOH use,no evidence of delirium tremens, okay to continue multivitamin supplement  7)AKI--creatinine was 2.0 on admission from 1.45on1/14/2020,  avoid nephrotoxic agents, with hydration creatinine is down to 1.7  Code Status : Full  Family Communication:   na  Disposition Plan  : home  Consults  :  na  Discharge Condition: stable  Diet and Activity recommendation:  As advised  Discharge Instructions    Discharge Instructions    Call MD for:  difficulty breathing, headache or visual disturbances   Complete by:  As directed    Call MD for:  persistant dizziness or light-headedness   Complete by:  As directed    Call MD for:  persistant nausea and vomiting   Complete by:  As directed    Call MD for:  severe uncontrolled pain   Complete by:  As directed    Call MD for:  temperature >100.4   Complete by:  As directed    Diet - low sodium heart healthy   Complete by:  As directed    Discharge instructions   Complete by:  As directed    1)call [phone] on Sunday, 09/25/2018  After 1 PM to get the results of your urine culture and to see if the antibiotics need to be changed  3) take tamsulosin/Flomax as prescribed for prostate and urination problems  4) take all other medications as prescribed  5) follow-up with  your primary care physician in less than a week for repeat CBC and BMP blood test   Increase activity slowly   Complete by:  As directed         Discharge Medications     Allergies as of 09/24/2018      Reactions   Aspirin Other (See Comments)   REACTION: Diverticular Bleed      Medication List    TAKE these medications   acetaminophen 325 MG tablet Commonly known as:  TYLENOL Take 2 tablets (650 mg total) by mouth every 6 (six) hours as needed for mild pain, fever or headache (or Fever >/= 101).   amLODipine 5 MG tablet Commonly known as:  NORVASC Take 1 tablet (5 mg total) by mouth daily.   atorvastatin 20 MG tablet Commonly known as:  LIPITOR Take 1 tablet (20 mg total) by mouth daily.   cephALEXin 500 MG capsule Commonly known as:  KEFLEX Take 1 capsule (500 mg total) by mouth 3 (three) times daily for 3 days.   clindamycin 1 % gel Commonly known as:  CLINDAGEL Apply topically 2 (two) times daily. What changed:    how much to take  additional instructions   HYDROcodone-acetaminophen 5-325 MG tablet Commonly known as:  NORCO/VICODIN One tablet every six hours as needed for acute pain.  Must last 14 days.   Iron Polysacch Cmplx-B12-FA 150-0.025-1 MG Caps Take 1 tablet by mouth daily.   levothyroxine 200 MCG tablet Commonly known as:  SYNTHROID, LEVOTHROID Take 1 tablet (200 mcg total) by mouth daily before breakfast.   metoprolol succinate 25 MG 24 hr tablet Commonly known as:  TOPROL-XL Take 1.5 tablets (37.5 mg total) by mouth daily.   omeprazole 20 MG capsule Commonly known as:  PRILOSEC TAKE 1 CAPSULE BY MOUTH ONCE DAILY.   tamsulosin 0.4 MG Caps capsule Commonly known as:  FLOMAX Take 1 capsule (0.4 mg total) by mouth daily after supper. What changed:  when to take this   ULORIC 40 MG tablet Generic drug:  febuxostat TAKE 1 TABLET BY MOUTH ONCE A DAY. What changed:  how much to take       Major procedures and Radiology Reports -  PLEASE  review detailed and final reports for all details, in brief -   Dg Chest 2 View  Result Date: 09/22/2018 CLINICAL DATA:  Nonproductive cough. EXAM: CHEST - 2 VIEW COMPARISON:  09/13/2018. FINDINGS: Mediastinum and hilar structures normal. Heart size normal. Metallic densities noted over the left upper abdomen unchanged. Mild bibasilar atelectasis. No pleural effusion or pneumothorax. IMPRESSION: Mild bibasilar atelectasis.  Exam otherwise unremarkable. Electronically Signed   By: Marcello Moores  Register   On: 09/22/2018 07:58   Dg Chest 2 View  Result Date: 09/13/2018 CLINICAL DATA:  Fever, nonproductive cough. EXAM: CHEST - 2 VIEW COMPARISON:  Radiographs of February 18, 2017. FINDINGS: The heart size and mediastinal contours are within normal limits. Both lungs are clear. The visualized skeletal structures are unremarkable. IMPRESSION: No active cardiopulmonary disease. Electronically Signed   By: Marijo Conception, M.D.   On: 09/13/2018 16:27   Ct Head Wo Contrast  Result Date: 08/25/2018 CLINICAL DATA:  Dizziness EXAM: CT HEAD WITHOUT CONTRAST TECHNIQUE: Contiguous axial images were obtained from the base of the skull through the vertex without intravenous contrast. COMPARISON:  None. FINDINGS: Brain: There is mild diffuse atrophy. There is no intracranial mass, hemorrhage, extra-axial fluid collection, or midline shift. There is patchy small vessel disease throughout the centra semiovale bilaterally. There is small vessel disease in the anterior limb of the right internal capsule. There is mild small vessel disease in each thalamus. There is no evident acute infarct. Vascular: There is no hyperdense vessel. There is calcification in each distal vertebral artery and carotid siphon region. Skull: The bony calvarium appears intact. Sinuses/Orbits: There is mucosal thickening in several ethmoid air cells. There is mucosal thickening in the lateral right maxillary antrum. Other visualized paranasal sinuses are  clear. Orbits appear symmetric bilaterally. Other: There is opacification in several inferior mastoid air cells on the right. Mastoids elsewhere appear clear. IMPRESSION: Atrophy with supratentorial small vessel disease. No acute infarct evident. No mass or hemorrhage. There are foci of arterial vascular calcification. There are areas of paranasal sinus disease. There is inferior right mastoid disease. Electronically Signed   By: Lowella Grip III M.D.   On: 08/25/2018 20:39   Micro Results   Recent Results (from the past 240 hour(s))  Urine culture     Status: Abnormal (Preliminary result)   Collection Time: 09/22/18  8:00 AM  Result Value Ref Range Status   Specimen Description URINE, CLEAN CATCH  Final   Special Requests   Final    NONE Performed at [facility], [address] [place], [place] [address]    Culture >=100,000 COLONIES/mL ESCHERICHIA COLI (A)  Final   Report Status PENDING  Incomplete  Culture, blood (routine x 2)     Status: None (Preliminary result)   Collection Time: 09/22/18  8:02 AM  Result Value Ref Range Status   Specimen Description BLOOD RIGHT ANTECUBITAL DRAWN BY RN  Final   Special Requests   Final    BOTTLES DRAWN AEROBIC AND ANAEROBIC Blood Culture adequate volume   Culture   Final    NO GROWTH < 24 HOURS Performed at [facility], [address], [place], [place] [address]    Report Status PENDING  Incomplete  Culture, blood (routine x 2)     Status: None (Preliminary result)   Collection Time: 09/22/18  8:03 AM  Result Value Ref Range Status   Specimen Description BLOOD LEFT ARM  Final   Special Requests   Final    BOTTLES  DRAWN AEROBIC AND ANAEROBIC Blood Culture adequate volume   Culture   Final    NO GROWTH < 24 HOURS Performed at [facility], [address] [place], [place] [address]    Report Status PENDING  Incomplete   Today   Subjective    [name] today has no new complaints, no fevers, no chills, no nausea, no vomiting, no  further hematuria, no flank pain,          Patient has been seen and examined prior to discharge   Objective   Blood pressure 110/75, pulse 81, temperature 98.5 F (36.9 C), temperature source Oral, resp. rate 19, height 6' (1.829 m), weight 120.2 kg, SpO2 96 %.   Intake/Output Summary (Last 24 hours) at 09/24/2018 1536 Last data filed at 09/24/2018 1413 Gross per 24 hour  Intake 2155.65 ml  Output 1800 ml  Net 355.65 ml    Exam Gen:- Awake Alert, no acute distress  HEENT:- [place].AT, No sclera icterus Neck-Supple Neck,No JVD,.  Lungs-  CTAB , good air movement bilaterally  CV- S1, S2 normal, regular Abd-  +ve B.Sounds, Abd Soft, No tenderness, no CVA area tenderness Extremity/Skin:- No  edema,   good pulses Psych-affect is appropriate, oriented x3 Neuro-no new focal deficits, no tremors  GU--- left-sided inguinal scrotal hernia, nontender, overlying skin without erythema or significant abnormalities  Data Review   CBC w Diff:  Lab Results  Component Value Date   WBC 5.9 09/24/2018   HGB 10.7 (L) 09/24/2018   HCT 35.1 (L) 09/24/2018   HCT 44 01/05/2013   PLT 220 09/24/2018   PLT 148 01/05/2013   LYMPHOPCT 13 09/22/2018   MONOPCT 12 09/22/2018   EOSPCT 1 09/22/2018   BASOPCT 0 09/22/2018   CMP:  Lab Results  Component Value Date   NA 138 09/24/2018   K 4.3 09/24/2018   CL 107 09/24/2018   CO2 25 09/24/2018   BUN 13 09/24/2018   CREATININE 1.27 (H) 09/24/2018   CREATININE 1.28 (H) 01/20/2017   PROT 8.3 (H) 09/22/2018   ALBUMIN 3.4 (L) 09/22/2018   BILITOT 1.3 (H) 09/22/2018   ALKPHOS 49 09/22/2018   AST 25 09/22/2018   ALT 23 09/22/2018  . Update::- Urine cx with ESBL E coli----- called patient at [phone] and informed him to pick up Cipro 250 mg bid for 10 days..........the patient Verbalizes understanding  Total Discharge time is about 33 minutes  Roxan Hockey M.D on 09/24/2018 at 3:36 PM  Go to [email or web address] -  for contact info  [facility] - Office  [phone]

## 2018-09-24 NOTE — Discharge Instructions (Signed)
1)call [phone] on Sunday, 09/25/2018  After 1 PM to get the results of your urine culture and to see if the antibiotics need to be changed  3) take tamsulosin/Flomax as prescribed for prostate and urination problems  4) take all other medications as prescribed  5) follow-up with your primary care physician in less than a week for repeat CBC and BMP blood test

## 2018-09-25 LAB — URINE CULTURE: Culture: >=100000 — AB

## 2018-09-26 ENCOUNTER — Ambulatory Visit (HOSPITAL_COMMUNITY): Payer: Medicare Other | Admitting: Internal Medicine

## 2018-09-26 ENCOUNTER — Inpatient Hospital Stay (HOSPITAL_COMMUNITY): Payer: Medicare Other | Attending: Hematology

## 2018-09-26 ENCOUNTER — Other Ambulatory Visit (HOSPITAL_COMMUNITY): Payer: Self-pay | Admitting: Internal Medicine

## 2018-09-26 DIAGNOSIS — N390 Urinary tract infection, site not specified: Secondary | ICD-10-CM | POA: Insufficient documentation

## 2018-09-26 DIAGNOSIS — D3A8 Other benign neuroendocrine tumors: Secondary | ICD-10-CM

## 2018-09-26 DIAGNOSIS — Z992 Dependence on renal dialysis: Secondary | ICD-10-CM | POA: Insufficient documentation

## 2018-09-26 DIAGNOSIS — R7989 Other specified abnormal findings of blood chemistry: Secondary | ICD-10-CM | POA: Insufficient documentation

## 2018-09-26 DIAGNOSIS — C7A092 Malignant carcinoid tumor of the stomach: Secondary | ICD-10-CM | POA: Insufficient documentation

## 2018-09-26 DIAGNOSIS — D500 Iron deficiency anemia secondary to blood loss (chronic): Secondary | ICD-10-CM

## 2018-09-27 ENCOUNTER — Ambulatory Visit (HOSPITAL_COMMUNITY): Payer: Medicare Other | Admitting: Internal Medicine

## 2018-09-27 ENCOUNTER — Other Ambulatory Visit: Payer: Self-pay

## 2018-09-27 ENCOUNTER — Encounter (HOSPITAL_COMMUNITY): Payer: Self-pay | Admitting: Internal Medicine

## 2018-09-27 ENCOUNTER — Inpatient Hospital Stay (HOSPITAL_BASED_OUTPATIENT_CLINIC_OR_DEPARTMENT_OTHER): Payer: Medicare Other | Admitting: Internal Medicine

## 2018-09-27 ENCOUNTER — Inpatient Hospital Stay (HOSPITAL_COMMUNITY): Payer: Medicare Other

## 2018-09-27 VITALS — BP 127/84 | HR 104 | Temp 98.4°F | Resp 18 | Wt 245.3 lb

## 2018-09-27 DIAGNOSIS — D3A8 Other benign neuroendocrine tumors: Secondary | ICD-10-CM

## 2018-09-27 DIAGNOSIS — Z992 Dependence on renal dialysis: Secondary | ICD-10-CM | POA: Diagnosis not present

## 2018-09-27 DIAGNOSIS — C7A092 Malignant carcinoid tumor of the stomach: Secondary | ICD-10-CM

## 2018-09-27 DIAGNOSIS — N390 Urinary tract infection, site not specified: Secondary | ICD-10-CM

## 2018-09-27 DIAGNOSIS — D500 Iron deficiency anemia secondary to blood loss (chronic): Secondary | ICD-10-CM | POA: Diagnosis not present

## 2018-09-27 DIAGNOSIS — R7989 Other specified abnormal findings of blood chemistry: Secondary | ICD-10-CM | POA: Diagnosis not present

## 2018-09-27 LAB — CBC WITH DIFFERENTIAL/PLATELET
Abs Immature Granulocytes: 0.02 10*3/uL (ref 0.00–0.07)
BASOS ABS: 0.1 10*3/uL (ref 0.0–0.1)
Basophils Relative: 1 %
Eosinophils Absolute: 0.4 10*3/uL (ref 0.0–0.5)
Eosinophils Relative: 6 %
HCT: 38.8 % — ABNORMAL LOW (ref 39.0–52.0)
Hemoglobin: 12.1 g/dL — ABNORMAL LOW (ref 13.0–17.0)
Immature Granulocytes: 0 %
Lymphocytes Relative: 35 %
Lymphs Abs: 2.4 10*3/uL (ref 0.7–4.0)
MCH: 29.9 pg (ref 26.0–34.0)
MCHC: 31.2 g/dL (ref 30.0–36.0)
MCV: 95.8 fL (ref 80.0–100.0)
Monocytes Absolute: 0.8 10*3/uL (ref 0.1–1.0)
Monocytes Relative: 11 %
Neutro Abs: 3.1 10*3/uL (ref 1.7–7.7)
Neutrophils Relative %: 47 %
Platelets: 270 10*3/uL (ref 150–400)
RBC: 4.05 MIL/uL — ABNORMAL LOW (ref 4.22–5.81)
RDW: 12.9 % (ref 11.5–15.5)
WBC: 6.7 10*3/uL (ref 4.0–10.5)
nRBC: 0 % (ref 0.0–0.2)

## 2018-09-27 LAB — CULTURE, BLOOD (ROUTINE X 2)
CULTURE: NO GROWTH
Culture: NO GROWTH
SPECIAL REQUESTS: ADEQUATE
SPECIAL REQUESTS: ADEQUATE

## 2018-09-27 LAB — COMPREHENSIVE METABOLIC PANEL
ALBUMIN: 3.3 g/dL — AB (ref 3.5–5.0)
ALT: 38 U/L (ref 0–44)
AST: 49 U/L — AB (ref 15–41)
Alkaline Phosphatase: 50 U/L (ref 38–126)
Anion gap: 10 (ref 5–15)
BUN: 14 mg/dL (ref 8–23)
CO2: 24 mmol/L (ref 22–32)
Calcium: 9 mg/dL (ref 8.9–10.3)
Chloride: 104 mmol/L (ref 98–111)
Creatinine, Ser: 1.31 mg/dL — ABNORMAL HIGH (ref 0.61–1.24)
GFR calc Af Amer: >60 mL/min (ref >60)
GFR calc non Af Amer: 55 mL/min — ABNORMAL LOW (ref >60)
Glucose, Bld: 113 mg/dL — ABNORMAL HIGH (ref 70–99)
Potassium: 4 mmol/L (ref 3.5–5.1)
Sodium: 138 mmol/L (ref 135–145)
Total Bilirubin: 0.3 mg/dL (ref 0.3–1.2)
Total Protein: 8 g/dL (ref 6.5–8.1)

## 2018-09-27 LAB — FERRITIN: Ferritin: 237 ng/mL (ref 24–336)

## 2018-09-27 LAB — LACTATE DEHYDROGENASE: LDH: 136 U/L (ref 98–192)

## 2018-09-27 NOTE — Progress Notes (Signed)
Diagnosis Iron deficiency anemia due to chronic blood loss - Plan: CBC with Differential/Platelet, Comprehensive metabolic panel, Lactate dehydrogenase, Serotonin serum, Chromogranin A, Ferritin  Neuroendocrine tumor - Plan: CBC with Differential/Platelet, Comprehensive metabolic panel, Lactate dehydrogenase, Serotonin serum, Chromogranin A, Ferritin  Staging Cancer Staging No matching staging information was found for the patient.  Assessment and Plan:  1.  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 Oneida Alar on 08/14/2014 and 10/08/2015 with negative octreotide scan on 09/19/2014.  His case was presented at GI tumor board in February/March 2016 with recommendation for anatomic biopsies to guide role of surgical resection.  Anatomic biopsies in 03/2016 were negative.  Pt was previously followed by PA [facility].    Labs done 09/27/2018 reviewed and showed WBC 6.7 HB 12.1 plts 270,000.  Chemistries WNL with K+ 4 Cr 1.31 and normal ALT of 38.  Awaiting CHR A levels.  Last scans were done in 2016.  If rise in [facility] A level will consider repeat imaging.    2.  Iron deficiency anemia, on Ferrex forte beginning on 02/04/2016.  Labs done 09/27/2018 reviewed and showed HB 12 and ferritin of 237.  Pt had colonoscopy done 01/04/2018 that showed hemorrhoids and diverticulosis. Pt should  Follow-up with GI as recommended.    3  UTI. Pt was recently hospitalized with UTI.  Pt afebrile in clinic with normal WBC of 6.7.  Follow-up with PCP or urology for monitoring.    4.  Elevated LFTs.  Pt has mildly elevated AST of 49.  He is on Lipitor.  Follow-up with PCP for monitoring.    5.  Health maintenance.  Pt had colonoscopy done 12/2017.  Follow-up with GI as recommended.     25 minutes spent with more than 50% spent in review of records, counseling and coordination of care.     Interval History:  Historical data obtained from note dated 09/22/2017.  Well-differentiated neuroendocrine  carcinoid tumor on polypectomy by Dr. Oneida Alar on 08/14/2014 and 10/08/2015 with negative octreotide scan on 09/19/2014.  His case was presented at GI tumor board in February/March 2016 with recommendation for anatomic biopsies to guide role of surgical resection.  Anatomic biopsies in 03/2016 were negative. AND Iron deficiency anemia, on Ferrex forte beginning on 02/04/2016.  Current Status:  Pt is seen today for follow-up.  He is here to go over labs.  He reports he was recently hospitalized and diagnosed with UTI.    Problem List Patient Active Problem List   Diagnosis Date Noted  . UTI (urinary tract infection) [N39.0] 09/22/2018  . AKI (acute kidney injury) ([place]) [N17.9] 09/22/2018  . Left inguinal hernia [K40.90] 09/20/2018  . S/P total knee replacement, left 06/29/06 [Z96.652] 01/27/2018  . Hyperlipidemia [E78.5] 10/23/2016  . Mild intellectual disabilities (CODE) [F70] 09/29/2016  . Personal history of gout [Z87.39] 09/22/2016  . Literacy level of illiterate [Z55.0] 09/22/2016  . S/P TKR (total knee replacement), right 09/18/08 [Z96.651] 09/22/2016  . Carcinoid tumor determined by biopsy of stomach [D3A.092] 04/10/2016  . Tubulovillous adenoma of colon [D12.6] 09/12/2015  . Neuroendocrine tumor [D3A.8] 09/25/2014  . Elevated LFTs [R94.5] 07/17/2014  . Iron deficiency anemia [D50.9] 07/29/2010  . Fatty liver [K76.0] 07/29/2010  . Alcohol abuse [F10.10] 11/30/2008  . Osteoarthrosis involving lower leg [M17.10] 08/20/2008  . Essential hypertension [I10] 10/20/2006  . B12 deficiency [E53.8] 08/15/2006  . GERD [K21.9] 08/15/2006  . IBS [K58.9] 08/15/2006  . BPH without obstruction/lower urinary tract symptoms [N40.0] 08/15/2006  . OSTEOARTHRITIS [M19.90] 08/15/2006  .  DEGENERATION, DISC NOS [IMO0002] 08/15/2006    Past Medical History Past Medical History:  Diagnosis Date  . Anemia    FeDA: GASTRIC POLYPS, B12; TCS 2008 EGD 2009, 2008-HB 11.1 MCV 83.6 CR 1.22, 2009 FERRITIN 102-22   . B12 deficiency   . Carcinoid tumor determined by biopsy of stomach 09/25/2014  . Chronic knee pain   . Diverticulosis of colon    Lower GI bleed 2008  . Essential hypertension   . GERD (gastroesophageal reflux disease)   . Gout   . Hepatomegaly    Hepatic steatosis  . History of alcohol abuse   . History of septic arthritis   . Hypothyroidism   . PUD   . Substance abuse [place])     Past Surgical History Past Surgical History:  Procedure Laterality Date  . BIOPSY  08/14/2014   Procedure: BIOPSY;  Surgeon: Danie Binder, MD;  [facility];  Service: Endoscopy;;  . BIOPSY  03/12/2015   Procedure: BIOPSY;  Surgeon: Danie Binder, MD;  Location: [facility];  Service: Endoscopy;;  . BIOPSY  04/21/2016   Procedure: BIOPSY;  Surgeon: Danie Binder, MD;  [facility];  Service: Endoscopy;;  bx's of antrum, body of stomach, fundus, and cardia  . CHOLECYSTECTOMY    . COLONOSCOPY  2008 [facility] DJ   LGIB 2o to TICS, prep good  . COLONOSCOPY WITH PROPOFOL N/A 08/14/2014   SLF: 1. Four large colon polyps removed. 2. The left colon is redundant 3. Moderate diverticulosis throughout teh entire examined colon  . COLONOSCOPY WITH PROPOFOL N/A 11/06/2014   SLF: 8 small polyps removed. One large pedunculated polyp removed from the ascending colon, tubulovillous and tubular adenomas. Next colonoscopy March 2019  . COLONOSCOPY WITH PROPOFOL N/A 01/04/2018   Procedure: COLONOSCOPY WITH PROPOFOL;  Surgeon: Danie Binder, MD;  [facility];  Service: Endoscopy;  Laterality: N/A;  10:00am  . ESOPHAGOGASTRODUODENOSCOPY  12/08/2007   QPR:FFMB gastric polyps seen in the cardia and body of the stomach/Normal esophagus without evidence of Barrett's, mass, erosion/Normal duodenal bulb and second portion of the duodenum. Benign bx.  . ESOPHAGOGASTRODUODENOSCOPY (EGD) WITH PROPOFOL N/A 08/14/2014   SLF: 1. Heme postive stools due to gastric and colon polyps 2. Multiple gastric   .  ESOPHAGOGASTRODUODENOSCOPY (EGD) WITH PROPOFOL N/A 03/12/2015   SLF: Multiple gastric nodules seen in gasric body/antrum. 2. Non-erosive gastritis ( inflammation) was found in the gastric antrum.   . ESOPHAGOGASTRODUODENOSCOPY (EGD) WITH PROPOFOL N/A 10/08/2015   Procedure: ESOPHAGOGASTRODUODENOSCOPY (EGD) WITH PROPOFOL;  Surgeon: Danie Binder, MD;  [facility];  Service: Endoscopy;  Laterality: N/A;  0945  . ESOPHAGOGASTRODUODENOSCOPY (EGD) WITH PROPOFOL N/A 04/21/2016   Procedure: ESOPHAGOGASTRODUODENOSCOPY (EGD) WITH PROPOFOL;  Surgeon: Danie Binder, MD;  [facility];  Service: Endoscopy;  Laterality: N/A;  730   . HAND SURGERY Right    fracture repair with plates  . JOINT REPLACEMENT Bilateral    knees  . POLYPECTOMY  08/14/2014   Procedure: POLYPECTOMY;  Surgeon: Danie Binder, MD;  Location: [facility];  Service: Endoscopy;;  . POLYPECTOMY N/A 11/06/2014   Procedure: POLYPECTOMY;  Surgeon: Danie Binder, MD;  Location: [facility];  Service: Endoscopy;  Laterality: N/A;  Transverse Colon x3, Ascending Colon x2, Descending Colon x3  . REPLACEMENT TOTAL KNEE BILATERAL    . UPPER GASTROINTESTINAL ENDOSCOPY  APR 2009   INFLAMED HYPERPLASTIC POLYPS, CHRONIC GASTRITIS    Family History Family History  Problem  Relation Age of Onset  . Diabetes Mother   . Renal Disease Mother        failure  . Diabetes Father        'old age'  . Hypertension Sister   . Hypertension Brother   . Diabetes Brother   . Diabetes Sister   . Diabetes Sister   . Diabetes Brother   . Hypertension Brother   . Diabetes Brother        right BKA  . Kidney failure Brother        on dialysis  . Kidney failure Brother        on dialysis  . Diabetes Brother        bilateral BKA  . Colon polyps Neg Hx   . Colon cancer Neg Hx   . Pancreatic disease Neg Hx      Social History  reports that he has never smoked. He has never used smokeless tobacco. He reports current alcohol use of about 2.0  standard drinks of alcohol per week. He reports that he does not use drugs.  Medications  Current Outpatient Medications:  .  amLODipine (NORVASC) 5 MG tablet, Take 1 tablet (5 mg total) by mouth daily., Disp: 30 tablet, Rfl: 3 .  atorvastatin (LIPITOR) 20 MG tablet, Take 1 tablet (20 mg total) by mouth daily., Disp: 90 tablet, Rfl: 3 .  ciprofloxacin (CIPRO) 250 MG tablet, , Disp: , Rfl:  .  colchicine 0.6 MG tablet, Take 0.6 mg by mouth 3 (three) times daily., Disp: , Rfl:  .  HYDROcodone-acetaminophen (NORCO/VICODIN) 5-325 MG tablet, One tablet every six hours as needed for acute pain.  Must last 14 days., Disp: 35 tablet, Rfl: 0 .  iron polysaccharides (NIFEREX) 150 MG capsule, Take 150 mg by mouth daily., Disp: , Rfl:  .  levothyroxine (SYNTHROID, LEVOTHROID) 200 MCG tablet, Take 1 tablet (200 mcg total) by mouth daily before breakfast., Disp: 90 tablet, Rfl: 3 .  metoprolol succinate (TOPROL-XL) 25 MG 24 hr tablet, Take 1.5 tablets (37.5 mg total) by mouth daily., Disp: 45 tablet, Rfl: 6 .  omeprazole (PRILOSEC) 20 MG capsule, TAKE 1 CAPSULE BY MOUTH ONCE DAILY. (Patient taking differently: Take 20 mg by mouth daily. ), Disp: 30 capsule, Rfl: 3 .  tamsulosin (FLOMAX) 0.4 MG CAPS capsule, Take 1 capsule (0.4 mg total) by mouth daily after supper., Disp: 30 capsule, Rfl: 3 .  ULORIC 40 MG tablet, TAKE 1 TABLET BY MOUTH ONCE A DAY. (Patient taking differently: Take 40 mg by mouth daily. ), Disp: 90 tablet, Rfl: 0 .  acetaminophen (TYLENOL) 325 MG tablet, Take 2 tablets (650 mg total) by mouth every 6 (six) hours as needed for mild pain, fever or headache (or Fever >/= 101). (Patient not taking: Reported on 09/27/2018), Disp: 12 tablet, Rfl: 0  Allergies Aspirin  Review of Systems Review of Systems - Oncology ROS negative   Physical Exam  Vitals Wt Readings from Last 3 Encounters:  09/27/18 245 lb 5 oz (111.3 kg)  09/22/18 265 lb (120.2 kg)  09/20/18 247 lb 6.4 oz (112.2 kg)   Temp  Readings from Last 3 Encounters:  09/27/18 98.4 F (36.9 C) (Oral)  09/24/18 98.5 F (36.9 C) (Oral)  09/20/18 (!) 97.3 F (36.3 C) (Temporal)   BP Readings from Last 3 Encounters:  09/27/18 127/84  09/24/18 110/75  09/20/18 129/75   Pulse Readings from Last 3 Encounters:  09/27/18 (!) 104  09/24/18 81  09/20/18 (!) 107  Constitutional: Well-developed, well-nourished, and in no distress.   HENT: Head: Normocephalic and atraumatic.  Mouth/Throat: No oropharyngeal exudate. Mucosa moist. Eyes: Pupils are equal, round, and reactive to light. Conjunctivae are normal. No scleral icterus.  Neck: Normal range of motion. Neck supple. No JVD present.  Cardiovascular: Normal rate, regular rhythm and normal heart sounds.  Exam reveals no gallop and no friction rub.   No murmur heard. Pulmonary/Chest: Effort normal and breath sounds normal. No respiratory distress. No wheezes.No rales.  Abdominal: Soft. Bowel sounds are normal. No distension. There is no tenderness. There is no guarding.  Musculoskeletal: No edema or tenderness.  Lymphadenopathy: No cervical, axillary or supraclavicular adenopathy.  Neurological: Alert and oriented to person, place, and time. No cranial nerve deficit.  Skin: Skin is warm and dry. No rash noted. No erythema. No pallor.  Psychiatric: Affect and judgment normal.   Labs Appointment on 09/27/2018  Component Date Value Ref Range Status  . WBC 09/27/2018 6.7  4.0 - 10.5 K/uL Final  . RBC 09/27/2018 4.05* 4.22 - 5.81 MIL/uL Final  . Hemoglobin 09/27/2018 12.1* 13.0 - 17.0 g/dL Final  . HCT 09/27/2018 38.8* 39.0 - 52.0 % Final  . MCV 09/27/2018 95.8  80.0 - 100.0 fL Final  . MCH 09/27/2018 29.9  26.0 - 34.0 pg Final  . MCHC 09/27/2018 31.2  30.0 - 36.0 g/dL Final  . RDW 09/27/2018 12.9  11.5 - 15.5 % Final  . Platelets 09/27/2018 270  150 - 400 K/uL Final  . nRBC 09/27/2018 0.0  0.0 - 0.2 % Final  . Neutrophils Relative % 09/27/2018 47  % Final  . Neutro  Abs 09/27/2018 3.1  1.7 - 7.7 K/uL Final  . Lymphocytes Relative 09/27/2018 35  % Final  . Lymphs Abs 09/27/2018 2.4  0.7 - 4.0 K/uL Final  . Monocytes Relative 09/27/2018 11  % Final  . Monocytes Absolute 09/27/2018 0.8  0.1 - 1.0 K/uL Final  . Eosinophils Relative 09/27/2018 6  % Final  . Eosinophils Absolute 09/27/2018 0.4  0.0 - 0.5 K/uL Final  . Basophils Relative 09/27/2018 1  % Final  . Basophils Absolute 09/27/2018 0.1  0.0 - 0.1 K/uL Final  . Immature Granulocytes 09/27/2018 0  % Final  . Abs Immature Granulocytes 09/27/2018 0.02  0.00 - 0.07 K/uL Final   Performed at [facility], [address] [place], [place] [address]  . Sodium 09/27/2018 138  135 - 145 mmol/L Final  . Potassium 09/27/2018 4.0  3.5 - 5.1 mmol/L Final  . Chloride 09/27/2018 104  98 - 111 mmol/L Final  . CO2 09/27/2018 24  22 - 32 mmol/L Final  . Glucose, Bld 09/27/2018 113* 70 - 99 mg/dL Final  . BUN 09/27/2018 14  8 - 23 mg/dL Final  . Creatinine, Ser 09/27/2018 1.31* 0.61 - 1.24 mg/dL Final  . Calcium 09/27/2018 9.0  8.9 - 10.3 mg/dL Final  . Total Protein 09/27/2018 8.0  6.5 - 8.1 g/dL Final  . Albumin 09/27/2018 3.3* 3.5 - 5.0 g/dL Final  . AST 09/27/2018 49* 15 - 41 U/L Final  . ALT 09/27/2018 38  0 - 44 U/L Final  . Alkaline Phosphatase 09/27/2018 50  38 - 126 U/L Final  . Total Bilirubin 09/27/2018 0.3  0.3 - 1.2 mg/dL Final  . GFR calc non Af Amer 09/27/2018 55* >60 mL/min Final  . GFR calc Af Amer 09/27/2018 >60  >60 mL/min Final  . Anion gap 09/27/2018 10  5 - 15  Final   Performed at [facility], [address], [place], [place] [address]  . LDH 09/27/2018 136  98 - 192 U/L Final   Performed at [facility], [address], [place], [place] [address]  . Ferritin 09/27/2018 237  24 - 336 ng/mL Final   Performed at [facility], [address] [place], [place] [address]     Pathology Orders Placed This Encounter  Procedures  . CBC with Differential/Platelet    Standing Status:   Future     Standing Expiration Date:   09/28/2019  . Comprehensive metabolic panel    Standing Status:   Future    Standing Expiration Date:   09/28/2019  . Lactate dehydrogenase    Standing Status:   Future    Standing Expiration Date:   09/28/2019  . Serotonin serum    Standing Status:   Future    Standing Expiration Date:   09/28/2019  . Chromogranin A    Standing Status:   Future    Standing Expiration Date:   09/28/2019  . Ferritin    Standing Status:   Future    Standing Expiration Date:   09/28/2019       Zoila Shutter MD

## 2018-09-28 ENCOUNTER — Other Ambulatory Visit (HOSPITAL_COMMUNITY): Payer: Self-pay | Admitting: Internal Medicine

## 2018-09-28 ENCOUNTER — Telehealth: Payer: Self-pay | Admitting: Orthopaedic Surgery

## 2018-09-28 DIAGNOSIS — N390 Urinary tract infection, site not specified: Secondary | ICD-10-CM | POA: Diagnosis not present

## 2018-09-28 DIAGNOSIS — D3A8 Other benign neuroendocrine tumors: Secondary | ICD-10-CM

## 2018-09-28 DIAGNOSIS — K4090 Unilateral inguinal hernia, without obstruction or gangrene, not specified as recurrent: Secondary | ICD-10-CM | POA: Diagnosis not present

## 2018-09-28 LAB — CHROMOGRANIN A: Chromogranin A (ng/mL): 186 ng/mL — ABNORMAL HIGH (ref 0.0–101.8)

## 2018-09-28 MED ORDER — HYDROCODONE-ACETAMINOPHEN 5-325 MG PO TABS: ORAL_TABLET | ORAL | 0 refills | Status: DC

## 2018-09-28 NOTE — Telephone Encounter (Signed)
Hydrocodone-Acetaminophen 5/325 mg  Qty 35 Tablets  PATIENT USES  [facility]

## 2018-09-29 ENCOUNTER — Other Ambulatory Visit (HOSPITAL_COMMUNITY): Payer: Medicare Other

## 2018-09-29 ENCOUNTER — Other Ambulatory Visit (HOSPITAL_COMMUNITY): Payer: Self-pay | Admitting: *Deleted

## 2018-09-29 LAB — SEROTONIN SERUM: Serotonin, Serum: 151 ng/mL (ref 21–321)

## 2018-09-29 MED ORDER — POLYSACCHARIDE IRON COMPLEX 150 MG PO CAPS: 150.0000 mg | ORAL_CAPSULE | Freq: Every day | ORAL | 1 refills | Status: DC

## 2018-09-29 MED FILL — FERREX 150 CAPSULE: 150 | 90 days supply | Qty: 90 | Fill #0

## 2018-09-29 NOTE — Telephone Encounter (Signed)
Chart reviewed, niferex refilled for 90 day supply per patient request.

## 2018-09-30 ENCOUNTER — Inpatient Hospital Stay (HOSPITAL_COMMUNITY): Admission: RE | Admit: 2018-09-30 | Payer: Medicare Other | Source: Ambulatory Visit

## 2018-10-05 ENCOUNTER — Ambulatory Visit: Admit: 2018-10-05 | Payer: Medicare Other | Admitting: General Surgery

## 2018-10-05 SURGERY — REPAIR, HERNIA, INGUINAL, ADULT
Anesthesia: General | Laterality: Left

## 2018-10-13 ENCOUNTER — Ambulatory Visit (INDEPENDENT_AMBULATORY_CARE_PROVIDER_SITE_OTHER): Payer: Medicare Other | Admitting: Orthopaedic Surgery

## 2018-10-13 ENCOUNTER — Encounter: Payer: Self-pay | Admitting: Orthopaedic Surgery

## 2018-10-13 VITALS — BP 129/83 | HR 78 | Ht 72.0 in | Wt 247.0 lb

## 2018-10-13 DIAGNOSIS — G894 Chronic pain syndrome: Secondary | ICD-10-CM | POA: Diagnosis not present

## 2018-10-13 DIAGNOSIS — M1A0410 Idiopathic chronic gout, right hand, without tophus (tophi): Secondary | ICD-10-CM | POA: Diagnosis not present

## 2018-10-13 MED ORDER — HYDROCODONE-ACETAMINOPHEN 5-325 MG PO TABS: ORAL_TABLET | ORAL | 0 refills | Status: DC

## 2018-10-13 NOTE — Progress Notes (Signed)
Patient [name]:TFTDD Fant, male DOB:1951-02-24, 68 y.o. UKG:[id number]  Chief Complaint  Patient presents with  . Gout    HPI  [name] is a 68 y.o. male who has chronic gout of the wrists,more on the right side.  He has no new trauma, no new acute episodes.  He is taking his allopurinol.  He has chronic swelling and pain.   Body mass index is 33.5 kg/m.  ROS  Review of Systems  HENT: Negative for congestion.   Respiratory: Negative for cough and shortness of breath.   Cardiovascular: Negative for chest pain and leg swelling.  Endocrine: Positive for cold intolerance.  Musculoskeletal: Positive for arthralgias and joint swelling.  Allergic/Immunologic: Positive for environmental allergies.  All other systems reviewed and are negative.   All other systems reviewed and are negative.  The following is a summary of the past history medically, past history surgically, known current medicines, social history and family history.  This information is gathered electronically by the computer from prior information and documentation.  I review this each visit and have found including this information at this point in the chart is beneficial and informative.    Past Medical History:  Diagnosis Date  . Anemia    FeDA: GASTRIC POLYPS, B12; TCS 2008 EGD 2009, 2008-HB 11.1 MCV 83.6 CR 1.22, 2009 FERRITIN 102-22  . B12 deficiency   . Carcinoid tumor determined by biopsy of stomach 09/25/2014  . Chronic knee pain   . Diverticulosis of colon    Lower GI bleed 2008  . Essential hypertension   . GERD (gastroesophageal reflux disease)   . Gout   . Hepatomegaly    Hepatic steatosis  . History of alcohol abuse   . History of septic arthritis   . Hypothyroidism   . PUD   . Substance abuse [facility])     Past Surgical History:  Procedure Laterality Date  . BIOPSY  08/14/2014   Procedure: BIOPSY;  Surgeon: Danie Binder, MD;  [facility]: [facility];  Service: Endoscopy;;  . BIOPSY  03/12/2015   Procedure: BIOPSY;  Surgeon: Danie Binder, MD;  Location: [facility];  Service: Endoscopy;;  . BIOPSY  04/21/2016   Procedure: BIOPSY;  Surgeon: Danie Binder, MD;  [facility];  Service: Endoscopy;;  bx's of antrum, body of stomach, fundus, and cardia  . CHOLECYSTECTOMY    . COLONOSCOPY  2008 [facility] DJ   LGIB 2o to TICS, prep good  . COLONOSCOPY WITH PROPOFOL N/A 08/14/2014   SLF: 1. Four large colon polyps removed. 2. The left colon is redundant 3. Moderate diverticulosis throughout teh entire examined colon  . COLONOSCOPY WITH PROPOFOL N/A 11/06/2014   SLF: 8 small polyps removed. One large pedunculated polyp removed from the ascending colon, tubulovillous and tubular adenomas. Next colonoscopy March 2019  . COLONOSCOPY WITH PROPOFOL N/A 01/04/2018   Procedure: COLONOSCOPY WITH PROPOFOL;  Surgeon: Danie Binder, MD;  [facility];  Service: Endoscopy;  Laterality: N/A;  10:00am  . ESOPHAGOGASTRODUODENOSCOPY  12/08/2007   JSE:GBTD gastric polyps seen in the cardia and body of the stomach/Normal esophagus without evidence of Barrett's, mass, erosion/Normal duodenal bulb and second portion of the duodenum. Benign bx.  . ESOPHAGOGASTRODUODENOSCOPY (EGD) WITH PROPOFOL N/A 08/14/2014   SLF: 1. Heme postive stools due to gastric and colon polyps 2. Multiple gastric   . ESOPHAGOGASTRODUODENOSCOPY (EGD) WITH PROPOFOL N/A 03/12/2015   SLF: Multiple gastric nodules seen in gasric body/antrum. 2. Non-erosive gastritis ( inflammation) was  found in the gastric antrum.   . ESOPHAGOGASTRODUODENOSCOPY (EGD) WITH PROPOFOL N/A 10/08/2015   Procedure: ESOPHAGOGASTRODUODENOSCOPY (EGD) WITH PROPOFOL;  Surgeon: Danie Binder, MD;  [facility];  Service: Endoscopy;  Laterality: N/A;  0945  . ESOPHAGOGASTRODUODENOSCOPY (EGD) WITH PROPOFOL N/A 04/21/2016   Procedure: ESOPHAGOGASTRODUODENOSCOPY (EGD) WITH PROPOFOL;  Surgeon: Danie Binder, MD;  [facility];  Service: Endoscopy;   Laterality: N/A;  730   . HAND SURGERY Right    fracture repair with plates  . JOINT REPLACEMENT Bilateral    knees  . POLYPECTOMY  08/14/2014   Procedure: POLYPECTOMY;  Surgeon: Danie Binder, MD;  Location[facility];  Service: Endoscopy;;  . POLYPECTOMY N/A 11/06/2014   Procedure: POLYPECTOMY;  Surgeon: Danie Binder, MD;  Location: [facility];  Service: Endoscopy;  Laterality: N/A;  Transverse Colon x3, Ascending Colon x2, Descending Colon x3  . REPLACEMENT TOTAL KNEE BILATERAL    . UPPER GASTROINTESTINAL ENDOSCOPY  APR 2009   INFLAMED HYPERPLASTIC POLYPS, CHRONIC GASTRITIS    Family History  Problem Relation Age of Onset  . Diabetes Mother   . Renal Disease Mother        failure  . Diabetes Father        'old age'  . Hypertension Sister   . Hypertension Brother   . Diabetes Brother   . Diabetes Sister   . Diabetes Sister   . Diabetes Brother   . Hypertension Brother   . Diabetes Brother        right BKA  . Kidney failure Brother        on dialysis  . Kidney failure Brother        on dialysis  . Diabetes Brother        bilateral BKA  . Colon polyps Neg Hx   . Colon cancer Neg Hx   . Pancreatic disease Neg Hx     Social History Social History   Tobacco Use  . Smoking status: Never Smoker  . Smokeless tobacco: Never Used  . Tobacco comment: Quit x 10 years/ never smoked on regular basis  Substance Use Topics  . Alcohol use: Yes    Alcohol/week: 2.0 standard drinks    Types: 2 Shots of liquor per week    Comment: drinks on weekends, gin/vodka 1/5th.  . Drug use: No    Allergies  Allergen Reactions  . Aspirin Other (See Comments)    REACTION: Diverticular Bleed    Current Outpatient Medications  Medication Sig Dispense Refill  . amLODipine (NORVASC) 5 MG tablet Take 1 tablet (5 mg total) by mouth daily. 30 tablet 3  . atorvastatin (LIPITOR) 20 MG tablet Take 1 tablet (20 mg total) by mouth daily. 90 tablet 3  . ciprofloxacin (CIPRO) 250 MG tablet     .  colchicine 0.6 MG tablet Take 0.6 mg by mouth 3 (three) times daily.    Marland Kitchen HYDROcodone-acetaminophen (NORCO/VICODIN) 5-325 MG tablet One tablet every six hours as needed for acute pain.  Must last 14 days. 35 tablet 0  . iron polysaccharides (NIFEREX) 150 MG capsule Take 1 capsule (150 mg total) by mouth daily. 90 capsule 1  . levothyroxine (SYNTHROID, LEVOTHROID) 200 MCG tablet Take 1 tablet (200 mcg total) by mouth daily before breakfast. 90 tablet 3  . metoprolol succinate (TOPROL-XL) 25 MG 24 hr tablet Take 1.5 tablets (37.5 mg total) by mouth daily. 45 tablet 6  . omeprazole (PRILOSEC) 20 MG capsule TAKE 1 CAPSULE  BY MOUTH ONCE DAILY. (Patient taking differently: Take 20 mg by mouth daily. ) 30 capsule 3  . tamsulosin (FLOMAX) 0.4 MG CAPS capsule Take 1 capsule (0.4 mg total) by mouth daily after supper. 30 capsule 3  . ULORIC 40 MG tablet TAKE 1 TABLET BY MOUTH ONCE A DAY. 90 tablet 0  . acetaminophen (TYLENOL) 325 MG tablet Take 2 tablets (650 mg total) by mouth every 6 (six) hours as needed for mild pain, fever or headache (or Fever >/= 101). (Patient not taking: Reported on 09/27/2018) 12 tablet 0   No current facility-administered medications for this visit.      Physical Exam  Blood pressure 129/83, pulse 78, height 6' (1.829 m), weight 247 lb (112 kg).  Constitutional: overall normal hygiene, normal nutrition, well developed, normal grooming, normal body habitus. Assistive device:none  Musculoskeletal: gait and station Limp none, muscle tone and strength are normal, no tremors or atrophy is present.  .  Neurological: coordination overall normal.  Deep tendon reflex/nerve stretch intact.  Sensation normal.  Cranial nerves II-XII intact.   Skin:   Normal overall no scars, lesions, ulcers or rashes. No psoriasis.  Psychiatric: Alert and oriented x 3.  Recent memory intact, remote memory unclear.  Normal mood and affect. Well groomed.  Good eye contact.  Cardiovascular: overall no  swelling, no varicosities, no edema bilaterally, normal temperatures of the legs and arms, no clubbing, cyanosis and good capillary refill.  Lymphatic: palpation is normal.  The right dorsal wrist has chronic swelling and tenderness.  It is not red or hot.  ROM is decreased and tender.  NV intact.  All other systems reviewed and are negative   The patient has been educated about the nature of the problem(s) and counseled on treatment options.  The patient appeared to understand what I have discussed and is in agreement with it.  Encounter Diagnoses  Name Primary?  . 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 Chronic pain disorder     PLAN Call if any problems.  Precautions discussed.  Continue current medications.   Return to clinic 3 months   I have reviewed the [place] web site prior to prescribing narcotic medicine for this patient.   Electronically Signed Sanjuana Kava, MD 2/13/20209:30 AM

## 2018-10-18 ENCOUNTER — Ambulatory Visit (HOSPITAL_COMMUNITY)
Admission: RE | Admit: 2018-10-18 | Discharge: 2018-10-18 | Disposition: A | Discharge status: Home / Self Care | Payer: Medicare Other | Source: Ambulatory Visit | Attending: Internal Medicine | Admitting: Internal Medicine

## 2018-10-18 DIAGNOSIS — R918 Other nonspecific abnormal finding of lung field: Secondary | ICD-10-CM | POA: Diagnosis not present

## 2018-10-18 DIAGNOSIS — D3A8 Other benign neuroendocrine tumors: Secondary | ICD-10-CM | POA: Diagnosis not present

## 2018-10-18 DIAGNOSIS — K4090 Unilateral inguinal hernia, without obstruction or gangrene, not specified as recurrent: Secondary | ICD-10-CM | POA: Diagnosis not present

## 2018-10-18 MED ORDER — GALLIUM GA 68 DOTATATE IV KIT
5.3000 | PACK | Freq: Once | INTRAVENOUS | Status: AC
  Administered 2018-10-18: 5.3 via INTRAVENOUS

## 2018-10-24 ENCOUNTER — Other Ambulatory Visit: Payer: Self-pay

## 2018-10-24 ENCOUNTER — Encounter (HOSPITAL_COMMUNITY): Payer: Self-pay | Admitting: Internal Medicine

## 2018-10-24 ENCOUNTER — Inpatient Hospital Stay (HOSPITAL_COMMUNITY): Payer: Medicare Other | Attending: Hematology | Admitting: Internal Medicine

## 2018-10-24 VITALS — BP 147/89 | HR 96 | Temp 97.8°F | Resp 16 | Wt 249.0 lb

## 2018-10-24 DIAGNOSIS — C7A00 Malignant carcinoid tumor of unspecified site: Secondary | ICD-10-CM

## 2018-10-24 DIAGNOSIS — Z79899 Other long term (current) drug therapy: Secondary | ICD-10-CM

## 2018-10-24 DIAGNOSIS — E039 Hypothyroidism, unspecified: Secondary | ICD-10-CM | POA: Diagnosis not present

## 2018-10-24 DIAGNOSIS — I10 Essential (primary) hypertension: Secondary | ICD-10-CM

## 2018-10-24 DIAGNOSIS — R918 Other nonspecific abnormal finding of lung field: Secondary | ICD-10-CM | POA: Diagnosis not present

## 2018-10-24 DIAGNOSIS — D3A8 Other benign neuroendocrine tumors: Secondary | ICD-10-CM

## 2018-10-24 DIAGNOSIS — D509 Iron deficiency anemia, unspecified: Secondary | ICD-10-CM

## 2018-10-24 NOTE — Progress Notes (Signed)
Diagnosis Neuroendocrine tumor - Plan: CBC with Differential, Comprehensive metabolic panel, Lactate dehydrogenase, Chromogranin A  Abnormal findings on diagnostic imaging of lung - Plan: CBC with Differential, Comprehensive metabolic panel, Lactate dehydrogenase, Chromogranin A, CT Chest Wo Contrast  Staging Cancer Staging No matching staging information was found for the patient.  Assessment and Plan:  1.  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 Oneida Alar on 08/14/2014 and 10/08/2015 with negative octreotide scan on 09/19/2014.  His case was presented at GI tumor board in February/March 2016 with recommendation for anatomic biopsies to guide role of surgical resection.  Anatomic biopsies in 03/2016 were negative.  Pt was previously followed by PA [facility].    Labs done 09/27/2018 reviewed and showed WBC 6.7 HB 12.1 plts 270,000.  Chemistries WNL with K+ 4 Cr 1.31 and normal ALT of 38.  CHR A level elevated at 186 with normal < 100.  Pt was set up for NET PET scan that was done 10/18/2018 and reviewed and showed IMPRESSION: 1. No evidence of well differentiated neuroendocrine tumor on skull base to thigh DOTATATE PET scan. 2. Specifically, no abnormal radiotracer accumulation within the stomach at site of prior polypectomy revealing neuroendocrine tumor. 3. No evidence of metastatic disease. 4. LEFT inguinal hernia as described above. 5. LEFT upper lobe pulmonary nodule. Non-contrast chest CT at 6-12 months is recommended. If the nodule is stable at time of repeat CT, then future CT at 18-24 months (from today's scan) is considered optional for low-risk patients, but is recommended for high-risk patients.   Pt remains asymptomatic. He will RTC in 03/2020 with repeat labs and CHR A level.  Last colonoscopy was done 01/04/2018 and showed diverticulosis and hemorrhoids.  Will determine if GI follow-up recommended due to mild increase in CHR A level.    2.  Pulmonary  nodule. Pt had 6 mm LUL pulmonary nodule on NET PET scan done 10/18/2018.  He will be set up for CT of chest in 04/2019 for follow-up.   3.  Iron deficiency anemia, on Ferrex forte beginning on 02/04/2016.  Labs done 09/27/2018 reviewed and showed HB 12 and ferritin of 237.  Pt had colonoscopy done 01/04/2018 that showed hemorrhoids and diverticulosis. Pt should  Follow-up with GI as recommended.    4.  Elevated LFTs.  Pt has mildly elevated AST of 49.  He is on Lipitor. Recent NET PET scan done 10/18/2018 showed no abnormal liver findings.   5.  Hernia.  Pt had large left (5 cm)  inguinal hernia noted on recent imaging done 10/2018.  He has been seen by surgery in the past and should follow-up as directed.    6.  Health maintenance.  Pt had colonoscopy done 12/2017.  Follow-up with GI as recommended.     25 minutes spent with more than 50% spent in review of records, counseling and coordination of care.     Interval History:  Historical data obtained from note dated 09/22/2017.  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 Oneida Alar on 08/14/2014 and 10/08/2015 with negative octreotide scan on 09/19/2014.  His case was presented at GI tumor board in February/March 2016 with recommendation for anatomic biopsies to guide role of surgical resection.  Anatomic biopsies in 03/2016 were negative. AND Iron deficiency anemia, on Ferrex forte beginning on 02/04/2016.  Current Status:  Pt is seen today for follow-up.  He is here to go over labs and PET scan.    Problem List Patient Active Problem List  Diagnosis Date Noted  . UTI (urinary tract infection) [N39.0] 09/22/2018  . AKI (acute kidney injury) ([place]) [N17.9] 09/22/2018  . Left inguinal hernia [K40.90] 09/20/2018  . S/P total knee replacement, left 06/29/06 [Z96.652] 01/27/2018  . Hyperlipidemia [E78.5] 10/23/2016  . Mild intellectual disabilities (CODE) [F70] 09/29/2016  . Personal history of gout [Z87.39] 09/22/2016  . Literacy  level of illiterate [Z55.0] 09/22/2016  . S/P TKR (total knee replacement), right 09/18/08 [Z96.651] 09/22/2016  . Carcinoid tumor determined by biopsy of stomach [D3A.092] 04/10/2016  . Tubulovillous adenoma of colon [D12.6] 09/12/2015  . Neuroendocrine tumor [D3A.8] 09/25/2014  . Elevated LFTs [R94.5] 07/17/2014  . Iron deficiency anemia [D50.9] 07/29/2010  . Fatty liver [K76.0] 07/29/2010  . Alcohol abuse [F10.10] 11/30/2008  . Osteoarthrosis involving lower leg [M17.10] 08/20/2008  . Essential hypertension [I10] 10/20/2006  . B12 deficiency [E53.8] 08/15/2006  . GERD [K21.9] 08/15/2006  . IBS [K58.9] 08/15/2006  . BPH without obstruction/lower urinary tract symptoms [N40.0] 08/15/2006  . OSTEOARTHRITIS [M19.90] 08/15/2006  . DEGENERATION, DISC NOS [IMO0002] 08/15/2006    Past Medical History Past Medical History:  Diagnosis Date  . Anemia    FeDA: GASTRIC POLYPS, B12; TCS 2008 EGD 2009, 2008-HB 11.1 MCV 83.6 CR 1.22, 2009 FERRITIN 102-22  . B12 deficiency   . Carcinoid tumor determined by biopsy of stomach 09/25/2014  . Chronic knee pain   . Diverticulosis of colon    Lower GI bleed 2008  . Essential hypertension   . GERD (gastroesophageal reflux disease)   . Gout   . Hepatomegaly    Hepatic steatosis  . History of alcohol abuse   . History of septic arthritis   . Hypothyroidism   . PUD   . Substance abuse [facility])     Past Surgical History Past Surgical History:  Procedure Laterality Date  . BIOPSY  08/14/2014   Procedure: BIOPSY;  Surgeon: Danie Binder, MD;  Location: [facility];  Service: Endoscopy;;  . BIOPSY  03/12/2015   Procedure: BIOPSY;  Surgeon: Danie Binder, MD;  Location: [facility];  Service: Endoscopy;;  . BIOPSY  04/21/2016   Procedure: BIOPSY;  Surgeon: Danie Binder, MD;  Location: [facility];  Service: Endoscopy;;  bx's of antrum, body of stomach, fundus, and cardia  . CHOLECYSTECTOMY    . COLONOSCOPY  2008 [facility] DJ   LGIB 2o to TICS, prep good  .  COLONOSCOPY WITH PROPOFOL N/A 08/14/2014   SLF: 1. Four large colon polyps removed. 2. The left colon is redundant 3. Moderate diverticulosis throughout teh entire examined colon  . COLONOSCOPY WITH PROPOFOL N/A 11/06/2014   SLF: 8 small polyps removed. One large pedunculated polyp removed from the ascending colon, tubulovillous and tubular adenomas. Next colonoscopy March 2019  . COLONOSCOPY WITH PROPOFOL N/A 01/04/2018   Procedure: COLONOSCOPY WITH PROPOFOL;  Surgeon: Danie Binder, MD;  [facility];  Service: Endoscopy;  Laterality: N/A;  10:00am  . ESOPHAGOGASTRODUODENOSCOPY  12/08/2007   CHY:IFOY gastric polyps seen in the cardia and body of the stomach/Normal esophagus without evidence of Barrett's, mass, erosion/Normal duodenal bulb and second portion of the duodenum. Benign bx.  . ESOPHAGOGASTRODUODENOSCOPY (EGD) WITH PROPOFOL N/A 08/14/2014   SLF: 1. Heme postive stools due to gastric and colon polyps 2. Multiple gastric   . ESOPHAGOGASTRODUODENOSCOPY (EGD) WITH PROPOFOL N/A 03/12/2015   SLF: Multiple gastric nodules seen in gasric body/antrum. 2. Non-erosive gastritis ( inflammation) was found in the gastric antrum.   . ESOPHAGOGASTRODUODENOSCOPY (EGD) WITH  PROPOFOL N/A 10/08/2015   Procedure: ESOPHAGOGASTRODUODENOSCOPY (EGD) WITH PROPOFOL;  Surgeon: Danie Binder, MD;  [facility];  Service: Endoscopy;  Laterality: N/A;  0945  . ESOPHAGOGASTRODUODENOSCOPY (EGD) WITH PROPOFOL N/A 04/21/2016   Procedure: ESOPHAGOGASTRODUODENOSCOPY (EGD) WITH PROPOFOL;  Surgeon: Danie Binder, MD;  [facility];  Service: Endoscopy;  Laterality: N/A;  730   . HAND SURGERY Right    fracture repair with plates  . JOINT REPLACEMENT Bilateral    knees  . POLYPECTOMY  08/14/2014   Procedure: POLYPECTOMY;  Surgeon: Danie Binder, MD;  Location: [facility];  Service: Endoscopy;;  . POLYPECTOMY N/A 11/06/2014   Procedure: POLYPECTOMY;  Surgeon: Danie Binder, MD;  Location: [facility];   Service: Endoscopy;  Laterality: N/A;  Transverse Colon x3, Ascending Colon x2, Descending Colon x3  . REPLACEMENT TOTAL KNEE BILATERAL    . UPPER GASTROINTESTINAL ENDOSCOPY  APR 2009   INFLAMED HYPERPLASTIC POLYPS, CHRONIC GASTRITIS    Family History Family History  Problem Relation Age of Onset  . Diabetes Mother   . Renal Disease Mother        failure  . Diabetes Father        'old age'  . Hypertension Sister   . Hypertension Brother   . Diabetes Brother   . Diabetes Sister   . Diabetes Sister   . Diabetes Brother   . Hypertension Brother   . Diabetes Brother        right BKA  . Kidney failure Brother        on dialysis  . Kidney failure Brother        on dialysis  . Diabetes Brother        bilateral BKA  . Colon polyps Neg Hx   . Colon cancer Neg Hx   . Pancreatic disease Neg Hx      Social History  reports that he has never smoked. He has never used smokeless tobacco. He reports current alcohol use of about 2.0 standard drinks of alcohol per week. He reports that he does not use drugs.  Medications  Current Outpatient Medications:  .  acetaminophen (TYLENOL) 325 MG tablet, Take 2 tablets (650 mg total) by mouth every 6 (six) hours as needed for mild pain, fever or headache (or Fever >/= 101)., Disp: 12 tablet, Rfl: 0 .  amLODipine (NORVASC) 5 MG tablet, Take 1 tablet (5 mg total) by mouth daily., Disp: 30 tablet, Rfl: 3 .  atorvastatin (LIPITOR) 20 MG tablet, Take 1 tablet (20 mg total) by mouth daily., Disp: 90 tablet, Rfl: 3 .  colchicine 0.6 MG tablet, Take 0.6 mg by mouth 3 (three) times daily., Disp: , Rfl:  .  HYDROcodone-acetaminophen (NORCO/VICODIN) 5-325 MG tablet, One tablet every six hours as needed for acute pain.  Must last 14 days., Disp: 35 tablet, Rfl: 0 .  iron polysaccharides (NIFEREX) 150 MG capsule, Take 1 capsule (150 mg total) by mouth daily., Disp: 90 capsule, Rfl: 1 .  levothyroxine (SYNTHROID, LEVOTHROID) 200 MCG tablet, Take 1 tablet (200  mcg total) by mouth daily before breakfast., Disp: 90 tablet, Rfl: 3 .  metoprolol succinate (TOPROL-XL) 25 MG 24 hr tablet, Take 1.5 tablets (37.5 mg total) by mouth daily., Disp: 45 tablet, Rfl: 6 .  omeprazole (PRILOSEC) 20 MG capsule, TAKE 1 CAPSULE BY MOUTH ONCE DAILY. (Patient taking differently: Take 20 mg by mouth daily. ), Disp: 30 capsule, Rfl: 3 .  tamsulosin (FLOMAX) 0.4 MG CAPS capsule,  Take 1 capsule (0.4 mg total) by mouth daily after supper., Disp: 30 capsule, Rfl: 3 .  ULORIC 40 MG tablet, TAKE 1 TABLET BY MOUTH ONCE A DAY., Disp: 90 tablet, Rfl: 0  Allergies Aspirin  Review of Systems Review of Systems - Oncology ROS negative   Physical Exam  Vitals Wt Readings from Last 3 Encounters:  10/24/18 249 lb (112.9 kg)  10/13/18 247 lb (112 kg)  09/27/18 245 lb 5 oz (111.3 kg)   Temp Readings from Last 3 Encounters:  10/24/18 97.8 F (36.6 C) (Oral)  09/27/18 98.4 F (36.9 C) (Oral)  09/24/18 98.5 F (36.9 C) (Oral)   BP Readings from Last 3 Encounters:  10/24/18 (!) 147/89  10/13/18 129/83  09/27/18 127/84   Pulse Readings from Last 3 Encounters:  10/24/18 96  10/13/18 78  09/27/18 (!) 104   Constitutional: Well-developed, well-nourished, and in no distress.   HENT: Head: Normocephalic and atraumatic.  Mouth/Throat: No oropharyngeal exudate. Mucosa moist. Eyes: Pupils are equal, round, and reactive to light. Conjunctivae are normal. No scleral icterus.  Neck: Normal range of motion. Neck supple. No JVD present.  Cardiovascular: Normal rate, regular rhythm and normal heart sounds.  Exam reveals no gallop and no friction rub.   No murmur heard. Pulmonary/Chest: Effort normal and breath sounds normal. No respiratory distress. No wheezes.No rales.  Abdominal: Soft. Bowel sounds are normal. No distension. There is no tenderness. There is no guarding.  Musculoskeletal: No edema or tenderness.  Lymphadenopathy: No cervical, axillary or supraclavicular  adenopathy.  Neurological: Alert and oriented to person, place, and time. No cranial nerve deficit.  Skin: Skin is warm and dry. No rash noted. No erythema. No pallor.  Psychiatric: Affect and judgment normal.   Labs No visits with results within 3 Day(s) from this visit.  Latest known visit with results is:  Appointment on 09/27/2018  Component Date Value Ref Range Status  . WBC 09/27/2018 6.7  4.0 - 10.5 K/uL Final  . RBC 09/27/2018 4.05* 4.22 - 5.81 MIL/uL Final  . Hemoglobin 09/27/2018 12.1* 13.0 - 17.0 g/dL Final  . HCT 09/27/2018 38.8* 39.0 - 52.0 % Final  . MCV 09/27/2018 95.8  80.0 - 100.0 fL Final  . MCH 09/27/2018 29.9  26.0 - 34.0 pg Final  . MCHC 09/27/2018 31.2  30.0 - 36.0 g/dL Final  . RDW 09/27/2018 12.9  11.5 - 15.5 % Final  . Platelets 09/27/2018 270  150 - 400 K/uL Final  . nRBC 09/27/2018 0.0  0.0 - 0.2 % Final  . Neutrophils Relative % 09/27/2018 47  % Final  . Neutro Abs 09/27/2018 3.1  1.7 - 7.7 K/uL Final  . Lymphocytes Relative 09/27/2018 35  % Final  . Lymphs Abs 09/27/2018 2.4  0.7 - 4.0 K/uL Final  . Monocytes Relative 09/27/2018 11  % Final  . Monocytes Absolute 09/27/2018 0.8  0.1 - 1.0 K/uL Final  . Eosinophils Relative 09/27/2018 6  % Final  . Eosinophils Absolute 09/27/2018 0.4  0.0 - 0.5 K/uL Final  . Basophils Relative 09/27/2018 1  % Final  . Basophils Absolute 09/27/2018 0.1  0.0 - 0.1 K/uL Final  . Immature Granulocytes 09/27/2018 0  % Final  . Abs Immature Granulocytes 09/27/2018 0.02  0.00 - 0.07 K/uL Final   Performed at [facility], [address] [place], [place] [address]  . Sodium 09/27/2018 138  135 - 145 mmol/L Final  . Potassium 09/27/2018 4.0  3.5 - 5.1 mmol/L Final  .  Chloride 09/27/2018 104  98 - 111 mmol/L Final  . CO2 09/27/2018 24  22 - 32 mmol/L Final  . Glucose, Bld 09/27/2018 113* 70 - 99 mg/dL Final  . BUN 09/27/2018 14  8 - 23 mg/dL Final  . Creatinine, Ser 09/27/2018 1.31* 0.61 - 1.24 mg/dL Final  . Calcium  09/27/2018 9.0  8.9 - 10.3 mg/dL Final  . Total Protein 09/27/2018 8.0  6.5 - 8.1 g/dL Final  . Albumin 09/27/2018 3.3* 3.5 - 5.0 g/dL Final  . AST 09/27/2018 49* 15 - 41 U/L Final  . ALT 09/27/2018 38  0 - 44 U/L Final  . Alkaline Phosphatase 09/27/2018 50  38 - 126 U/L Final  . Total Bilirubin 09/27/2018 0.3  0.3 - 1.2 mg/dL Final  . GFR calc non Af Amer 09/27/2018 55* >60 mL/min Final  . GFR calc Af Amer 09/27/2018 >60  >60 mL/min Final  . Anion gap 09/27/2018 10  5 - 15 Final   Performed at [facility], [address] [place], [place] [address]  . LDH 09/27/2018 136  98 - 192 U/L Final   Performed at [facility], [address], [place], [place] [address]  . Ferritin 09/27/2018 237  24 - 336 ng/mL Final   Performed at [facility], [address], [place], [place] [address]  . Chromogranin A (ng/mL) 09/27/2018 186.0* 0.0 - 101.8 ng/mL Final   Comment: (NOTE) Results of this test are labeled for research purposes only by the assay's manufacturer. The performance characteristics of this assay have not been established by the manufacturer. The result should not be used for treatment or for diagnostic purposes without confirmation of the diagnosis by another medically established diagnostic product or procedure. The performance characteristics were determined by [facility]. Chromogranin A performed by Thermofisher/BRAHMS KRYPTOR methodology Values obtained with different assay methods or kits cannot be used interchangeably.                          **Please note reference interval change** Performed At: [facility] [place], [place] [id number] Rush Farmer MD [id number]   . Serotonin, Serum 09/27/2018 151  21 - 321 ng/mL Final   Comment: (NOTE) Performed At: [facility] [place], [place] [id number] Rush Farmer MD [id number]      Pathology Orders Placed This Encounter  Procedures  . CT Chest Wo Contrast    Standing  Status:   Future    Standing Expiration Date:   10/24/2019    Order Specific Question:   Preferred imaging location?    Answer:   [facility]    Order Specific Question:   Radiology Contrast Protocol - do NOT remove file path    Answer:   [email or web address]  . CBC with Differential    Standing Status:   Future    Standing Expiration Date:   10/25/2019  . Comprehensive metabolic panel    Standing Status:   Future    Standing Expiration Date:   10/25/2019  . Lactate dehydrogenase    Standing Status:   Future    Standing Expiration Date:   10/25/2019  . Chromogranin A    Standing Status:   Future    Standing Expiration Date:   10/25/2019       Zoila Shutter MD

## 2018-10-26 DIAGNOSIS — D509 Iron deficiency anemia, unspecified: Secondary | ICD-10-CM | POA: Diagnosis not present

## 2018-10-26 DIAGNOSIS — E785 Hyperlipidemia, unspecified: Secondary | ICD-10-CM | POA: Diagnosis not present

## 2018-10-26 DIAGNOSIS — E039 Hypothyroidism, unspecified: Secondary | ICD-10-CM | POA: Diagnosis not present

## 2018-10-26 DIAGNOSIS — K4090 Unilateral inguinal hernia, without obstruction or gangrene, not specified as recurrent: Secondary | ICD-10-CM | POA: Diagnosis not present

## 2018-10-26 DIAGNOSIS — I10 Essential (primary) hypertension: Secondary | ICD-10-CM | POA: Diagnosis not present

## 2018-10-27 ENCOUNTER — Ambulatory Visit (INDEPENDENT_AMBULATORY_CARE_PROVIDER_SITE_OTHER): Payer: Medicare Other | Admitting: General Surgery

## 2018-10-27 ENCOUNTER — Encounter: Payer: Self-pay | Admitting: General Surgery

## 2018-10-27 VITALS — BP 124/72 | HR 86 | Temp 97.8°F | Resp 20 | Wt 236.0 lb

## 2018-10-27 DIAGNOSIS — K4090 Unilateral inguinal hernia, without obstruction or gangrene, not specified as recurrent: Secondary | ICD-10-CM | POA: Diagnosis not present

## 2018-10-27 NOTE — Patient Instructions (Signed)
Open Hernia Repair, Adult  Open hernia repair is a surgical procedure to fix a hernia. A hernia occurs when an internal organ or tissue pushes out through a weak spot in the abdominal wall muscles. Hernias commonly occur in the groin and around the navel. Most hernias tend to get worse over time. Often, surgery is done to prevent the hernia from becoming bigger, uncomfortable, or an emergency. Emergency surgery may be needed if abdominal contents get stuck in the opening (incarcerated hernia) or the blood supply gets cut off (strangulated hernia). In an open repair, an incision is made in the abdomen to perform the surgery. Tell a health care provider about:  Any allergies you have.  All medicines you are taking, including vitamins, herbs, eye drops, creams, and over-the-counter medicines.  Any problems you or family members have had with anesthetic medicines.  Any blood or bone disorders you have.  Any surgeries you have had.  Any medical conditions you have, including any recent cold or flu symptoms.  Whether you are pregnant or may be pregnant. What are the risks? Generally, this is a safe procedure. However, problems may occur, including:  Long-lasting (chronic) pain.  Bleeding.  Infection.  Damage to the testicle. This can cause shrinking or swelling.  Damage to the bladder, blood vessels, intestine, or nerves near the hernia.  Trouble passing urine.  Allergic reactions to medicines.  Return of the hernia. Medicines  Ask your health care provider about: ? Changing or stopping your regular medicines. This is especially important if you are taking diabetes medicines or blood thinners. ? Taking medicines such as aspirin and ibuprofen. These medicines can thin your blood. Do not take these medicines before your procedure if your health care provider instructs you not to.  You may be given antibiotic medicine to help prevent infection. General instructions  You may have  blood tests or imaging studies.  Ask your health care provider how your surgical site will be marked or identified.  If you smoke, do not smoke for at least 2 weeks before your procedure or for as long as told by your health care provider.  Let your health care provider know if you develop a cold or any infection before your surgery.  Plan to have someone take you home from the hospital or clinic.  If you will be going home right after the procedure, plan to have someone with you for 24 hours. What happens during the procedure?  To reduce your risk of infection: ? Your health care team will wash or sanitize their hands. ? Your skin will be washed with soap. ? Hair may be removed from the surgical area.  An IV tube will be inserted into one of your veins.  You will be given one or more of the following: ? A medicine to help you relax (sedative). ? A medicine to numb the area (local anesthetic). ? A medicine to make you fall asleep (general anesthetic).  Your surgeon will make an incision over the hernia.  The tissues of the hernia will be moved back into place.  The edges of the hernia may be stitched together.  The opening in the abdominal muscles will be closed with stitches (sutures). Or, your surgeon will place a mesh patch made of manmade (synthetic) material over the opening.  The incision will be closed.  A bandage (dressing) may be placed over the incision. The procedure may vary among health care providers and hospitals. What happens after the procedure?  Your blood pressure, heart rate, breathing rate, and blood oxygen level will be monitored until the medicines you were given have worn off.  You may be given medicine for pain.  Do not drive for 24 hours if you received a sedative. This information is not intended to replace advice given to you by your health care provider. Make sure you discuss any questions you have with your health care provider. Document  Released: 02/10/2001 Document Revised: 03/06/2016 Document Reviewed: 01/29/2016 Elsevier Interactive Patient Education  2019 [employer].

## 2018-10-27 NOTE — Progress Notes (Addendum)
[facility] History and Physical  Reason for Referral: Left inguinal hernia  Referring Physician:  Dr. Eulis Foster (ED)    [name] is a 68 y.o. male.  HPI: [name] is a very pleasant gentleman who was scheduled for surgery but then was admitted to the hospital with UTI and had to have his inguinal hernia surgery rescheduled. He presented original to [place] in January and was admitted a few days later for about 2 days. He has a history of HTN, Hypothyroidism, GERD, h/o stomach carcinoid s/p endoscopic excision who comes in with complaints of a left groin hernia that he has noted for about 6 months he says. He says that the left groin is swollen and there is a bulge and that it causes him discomfort at times. He says that he has normal BMs and eats and drinks normally. He has no obstructive symptoms.  He takes pain medication for chronic knee pain after knee replacement.  Past Medical History:  Diagnosis Date  . Anemia    FeDA: GASTRIC POLYPS, B12; TCS 2008 EGD 2009, 2008-HB 11.1 MCV 83.6 CR 1.22, 2009 FERRITIN 102-22  . B12 deficiency   . Carcinoid tumor determined by biopsy of stomach 09/25/2014  . Chronic knee pain   . Diverticulosis of colon    Lower GI bleed 2008  . Essential hypertension   . GERD (gastroesophageal reflux disease)   . Gout   . Hepatomegaly    Hepatic steatosis  . History of alcohol abuse   . History of septic arthritis   . Hypothyroidism   . PUD   . Substance abuse [facility])     Past Surgical History:  Procedure Laterality Date  . BIOPSY  08/14/2014   Procedure: BIOPSY;  Surgeon: Danie Binder, MD;  Location: [facility];  Service: Endoscopy;;  . BIOPSY  03/12/2015   Procedure: BIOPSY;  Surgeon: Danie Binder, MD;  Location: [facility];  Service: Endoscopy;;  . BIOPSY  04/21/2016   Procedure: BIOPSY;  Surgeon: Danie Binder, MD;  Location: [facility];  Service: Endoscopy;;  bx's of antrum, body of stomach, fundus, and cardia  . CHOLECYSTECTOMY    .  COLONOSCOPY  2008 [facility]   LGIB 2o to TICS, prep good  . COLONOSCOPY WITH PROPOFOL N/A 08/14/2014   SLF: 1. Four large colon polyps removed. 2. The left colon is redundant 3. Moderate diverticulosis throughout teh entire examined colon  . COLONOSCOPY WITH PROPOFOL N/A 11/06/2014   SLF: 8 small polyps removed. One large pedunculated polyp removed from the ascending colon, tubulovillous and tubular adenomas. Next colonoscopy March 2019  . COLONOSCOPY WITH PROPOFOL N/A 01/04/2018   Procedure: COLONOSCOPY WITH PROPOFOL;  Surgeon: Danie Binder, MD;  [facility];  Service: Endoscopy;  Laterality: N/A;  10:00am  . ESOPHAGOGASTRODUODENOSCOPY  12/08/2007   VOZ:DGUY gastric polyps seen in the cardia and body of the stomach/Normal esophagus without evidence of Barrett's, mass, erosion/Normal duodenal bulb and second portion of the duodenum. Benign bx.  . ESOPHAGOGASTRODUODENOSCOPY (EGD) WITH PROPOFOL N/A 08/14/2014   SLF: 1. Heme postive stools due to gastric and colon polyps 2. Multiple gastric   . ESOPHAGOGASTRODUODENOSCOPY (EGD) WITH PROPOFOL N/A 03/12/2015   SLF: Multiple gastric nodules seen in gasric body/antrum. 2. Non-erosive gastritis ( inflammation) was found in the gastric antrum.   . ESOPHAGOGASTRODUODENOSCOPY (EGD) WITH PROPOFOL N/A 10/08/2015   Procedure: ESOPHAGOGASTRODUODENOSCOPY (EGD) WITH PROPOFOL;  Surgeon: Danie Binder, MD;  [facility];  Service: Endoscopy;  Laterality: N/A;  0945  . ESOPHAGOGASTRODUODENOSCOPY (EGD) WITH PROPOFOL N/A 04/21/2016   Procedure: ESOPHAGOGASTRODUODENOSCOPY (EGD) WITH PROPOFOL;  Surgeon: Danie Binder, MD;  [facility];  Service: Endoscopy;  Laterality: N/A;  730   . HAND SURGERY Right    fracture repair with plates  . JOINT REPLACEMENT Bilateral    knees  . POLYPECTOMY  08/14/2014   Procedure: POLYPECTOMY;  Surgeon: Danie Binder, MD;  Location: [facility];  Service: Endoscopy;;  . POLYPECTOMY N/A 11/06/2014   Procedure:  POLYPECTOMY;  Surgeon: Danie Binder, MD;  Location: [facility];  Service: Endoscopy;  Laterality: N/A;  Transverse Colon x3, Ascending Colon x2, Descending Colon x3  . REPLACEMENT TOTAL KNEE BILATERAL    . UPPER GASTROINTESTINAL ENDOSCOPY  APR 2009   INFLAMED HYPERPLASTIC POLYPS, CHRONIC GASTRITIS    Family History  Problem Relation Age of Onset  . Diabetes Mother   . Renal Disease Mother        failure  . Diabetes Father        'old age'  . Hypertension Sister   . Hypertension Brother   . Diabetes Brother   . Diabetes Sister   . Diabetes Sister   . Diabetes Brother   . Hypertension Brother   . Diabetes Brother        right BKA  . Kidney failure Brother        on dialysis  . Kidney failure Brother        on dialysis  . Diabetes Brother        bilateral BKA  . Colon polyps Neg Hx   . Colon cancer Neg Hx   . Pancreatic disease Neg Hx     Social History   Tobacco Use  . Smoking status: Never Smoker  . Smokeless tobacco: Never Used  . Tobacco comment: Quit x 10 years/ never smoked on regular basis  Substance Use Topics  . Alcohol use: Yes    Alcohol/week: 2.0 standard drinks    Types: 2 Shots of liquor per week    Comment: drinks on weekends, gin/vodka 1/5th.  . Drug use: No    Medications: I have reviewed the patient's current medications. Allergies as of 10/27/2018      Reactions   Aspirin Other (See Comments)   REACTION: Diverticular Bleed      Medication List       Accurate as of October 27, 2018  2:17 PM. Always use your most recent med list.        acetaminophen 325 MG tablet Commonly known as:  TYLENOL Take 2 tablets (650 mg total) by mouth every 6 (six) hours as needed for mild pain, fever or headache (or Fever >/= 101).   amLODipine 5 MG tablet Commonly known as:  NORVASC Take 1 tablet (5 mg total) by mouth daily.   atorvastatin 20 MG tablet Commonly known as:  LIPITOR Take 1 tablet (20 mg total) by mouth daily.   colchicine 0.6 MG  tablet Take 0.6 mg by mouth 3 (three) times daily.   HYDROcodone-acetaminophen 5-325 MG tablet Commonly known as:  NORCO/VICODIN One tablet every six hours as needed for acute pain.  Must last 14 days.   iron polysaccharides 150 MG capsule Commonly known as:  NIFEREX Take 1 capsule (150 mg total) by mouth daily.   levothyroxine 200 MCG tablet Commonly known as:  SYNTHROID, LEVOTHROID Take 1 tablet (200 mcg total) by mouth daily before breakfast.   metoprolol succinate 25 MG  24 hr tablet Commonly known as:  TOPROL-XL Take 1.5 tablets (37.5 mg total) by mouth daily.   omeprazole 20 MG capsule Commonly known as:  PRILOSEC TAKE 1 CAPSULE BY MOUTH ONCE DAILY.   tamsulosin 0.4 MG Caps capsule Commonly known as:  FLOMAX Take 1 capsule (0.4 mg total) by mouth daily after supper.   ULORIC 40 MG tablet Generic drug:  febuxostat TAKE 1 TABLET BY MOUTH ONCE A DAY.        ROS:  A comprehensive review of systems was negative except for: Gastrointestinal: positive for left groin bulge Musculoskeletal: positive for knee pain  Blood pressure 124/72, pulse 86, temperature 97.8 F (36.6 C), temperature source Temporal, resp. rate 20, weight 236 lb (107 kg). Physical Exam  Results: PET scan 2/18- Neuroendocrine tumor history IMPRESSION: 1. No evidence of well differentiated neuroendocrine tumor on skull base to thigh DOTATATE PET scan. 2. Specifically, no abnormal radiotracer accumulation within the stomach at site of prior polypectomy revealing neuroendocrine tumor. 3. No evidence of metastatic disease. 4. LEFT inguinal hernia as described above. 5. LEFT upper lobe pulmonary nodule. Non-contrast chest CT at 6-12 months is recommended. If the nodule is stable at time of repeat CT, then future CT at 18-24 months (from today's scan) is considered optional for low-risk patients, but is recommended for high-risk patients. This recommendation follows the consensus  statement: Guidelines for Management of Incidental Pulmonary Nodules Detected on CT Images:From the Fleischner Society 2017; published online before print (10.1148/radiol.7588325498).   Assessment & Plan:  Adler Chartrand is a 68 y.o. male with a left inguinal hernia who would like to have this repaired. He was scheduled for surgery and then was admitted to the hospital with a UTI. He is doing well and has no complaints.  He did recently have follow up for his neuroendocrine tumor and was found to have some nodules in the lungs and his chromogranin A was elevated. He is going to be scheduled for an EGD with Dr. Oneida Alar in the upcoming weeks and a CT of the chest repeat in August.  I do not see any reason to not proceed with the inguinal hernia repair as this repair would still be needed separate from any procedure/ surgery for the stomach neuroendocrine tumor.   -OR for left inguinal hernia repair with mesh   Discussed the risk and benefits including, bleeding, infection, use of mesh, risk of recurrence, risk of nerve damage causing numbness or changes in sensation, risk of damage to the cord structures. The patient understands the risk and benefits of repair with mesh, and has decided to proceed.  We also discussed open versus laparoscopic surgery and the use of mesh. We discussed that I do open repairs with mesh, and that this is considered equivalent to laparoscopic surgery. We discussed reasons for opting for laparoscopic surgery including if a bilateral repair is needed or if a patient has a recurrence after an open repair.  All questions were answered to the satisfaction of the patient.   Virl Cagey 10/27/2018, 2:17 PM

## 2018-10-31 ENCOUNTER — Telehealth: Payer: Self-pay | Admitting: Gastroenterology

## 2018-10-31 ENCOUNTER — Telehealth: Payer: Self-pay | Admitting: Orthopaedic Surgery

## 2018-10-31 NOTE — Telephone Encounter (Signed)
  PLEASE CALL PT. HE NEEDS SEE DR. Breylin Dom IN THE [facility]. HIS BLOOD TEST FOR CARCINOID IS ELEVATED AND HE NEEDS TO SEE DR. Pamella Samons TO DISCUSS HAVING ANOTHER EGD.

## 2018-10-31 NOTE — Telephone Encounter (Signed)
Patient called for refill:  HYDROcodone-acetaminophen (NORCO/VICODIN) 5-325 MG tablet 35 tablet   - [employer]

## 2018-11-01 MED ORDER — HYDROCODONE-ACETAMINOPHEN 5-325 MG PO TABS: ORAL_TABLET | ORAL | 0 refills | Status: DC

## 2018-11-01 NOTE — Patient Instructions (Signed)
[name]  11/01/2018     @PREFPERIOPPHARMACY @   Your procedure is scheduled on  11/09/2018   Report to [facility] at  740  A.M.  Call this number if you have problems the morning of surgery:  [phone]   Remember:  Do not eat or drink after midnight.                      Take these medicines the morning of surgery with A SIP OF WATER  Amlodipine, colchicine, hydrocodone (if needed), levothyroxine, metoprolol, prilosec, flomax, uloric.    Do not wear jewelry, make-up or nail polish.  Do not wear lotions, powders, or perfumes, or deodorant.  Do not shave 48 hours prior to surgery.  Men may shave face and neck.  Do not bring valuables to the hospital.  [facility] is not responsible for any belongings or valuables.  Contacts, dentures or bridgework may not be worn into surgery.  Leave your suitcase in the car.  After surgery it may be brought to your room.  For patients admitted to the hospital, discharge time will be determined by your treatment team.  Patients discharged the day of surgery will not be allowed to drive home.   Name and phone number of your driver:   family Special instructions:  None  Please read over the following fact sheets that you were given. Anesthesia Post-op Instructions and Care and Recovery After Surgery       Open Hernia Repair, Adult, Care After These instructions give you information about caring for yourself after your procedure. Your doctor may also give you more specific instructions. If you have problems or questions, contact your doctor. Follow these instructions at home: Surgical cut (incision) care   Follow instructions from your doctor about how to take care of your surgical cut area. Make sure you: ? Wash your hands with soap and water before you change your bandage (dressing). If you cannot use soap and water, use hand sanitizer. ? Change your bandage as told by your doctor. ? Leave stitches (sutures), skin glue, or  skin tape (adhesive) strips in place. They may need to stay in place for 2 weeks or longer. If tape strips get loose and curl up, you may trim the loose edges. Do not remove tape strips completely unless your doctor says it is okay.  Check your surgical cut every day for signs of infection. Check for: ? More redness, swelling, or pain. ? More fluid or blood. ? Warmth. ? Pus or a bad smell. Activity  Do not drive or use heavy machinery while taking prescription pain medicine. Do not drive until your doctor says it is okay.  Until your doctor says it is okay: ? Do not lift anything that is heavier than 10 lb (4.5 kg). ? Do not play contact sports.  Return to your normal activities as told by your doctor. Ask your doctor what activities are safe. General instructions  To prevent or treat having a hard time pooping (constipation) while you are taking prescription pain medicine, your doctor may recommend that you: ? Drink enough fluid to keep your pee (urine) clear or pale yellow. ? Take over-the-counter or prescription medicines. ? Eat foods that are high in fiber, such as fresh fruits and vegetables, whole grains, and beans. ? Limit foods that are high in fat and processed sugars, such as fried and sweet foods.  Take over-the-counter and prescription medicines only  as told by your doctor.  Do not take baths, swim, or use a hot tub until your doctor says it is okay.  Keep all follow-up visits as told by your doctor. This is important. Contact a doctor if:  You develop a rash.  You have more redness, swelling, or pain around your surgical cut.  You have more fluid or blood coming from your surgical cut.  Your surgical cut feels warm to the touch.  You have pus or a bad smell coming from your surgical cut.  You have a fever or chills.  You have blood in your poop (stool).  You have not pooped in 2-3 days.  Medicine does not help your pain. Get help right away if:  You have  chest pain or you are short of breath.  You feel light-headed.  You feel weak and dizzy (feel faint).  You have very bad pain.  You throw up (vomit) and your pain is worse. This information is not intended to replace advice given to you by your health care provider. Make sure you discuss any questions you have with your health care provider. Document Released: 09/07/2014 Document Revised: 03/06/2016 Document Reviewed: 01/29/2016 Elsevier Interactive Patient Education  2019 [place] Anesthesia, Adult, Care After This sheet gives you information about how to care for yourself after your procedure. Your health care provider may also give you more specific instructions. If you have problems or questions, contact your health care provider. What can I expect after the procedure? After the procedure, the following side effects are common:  Pain or discomfort at the IV site.  Nausea.  Vomiting.  Sore throat.  Trouble concentrating.  Feeling cold or chills.  Weak or tired.  Sleepiness and fatigue.  Soreness and body aches. These side effects can affect parts of the body that were not involved in surgery. Follow these instructions at home:  For at least 24 hours after the procedure:  Have a responsible adult stay with you. It is important to have someone help care for you until you are awake and alert.  Rest as needed.  Do not: ? Participate in activities in which you could fall or become injured. ? Drive. ? Use heavy machinery. ? Drink alcohol. ? Take sleeping pills or medicines that cause drowsiness. ? Make important decisions or sign legal documents. ? Take care of children on your own. Eating and drinking  Follow any instructions from your health care provider about eating or drinking restrictions.  When you feel hungry, start by eating small amounts of foods that are soft and easy to digest (bland), such as toast. Gradually return to your regular  diet.  Drink enough fluid to keep your urine pale yellow.  If you vomit, rehydrate by drinking water, juice, or clear broth. General instructions  If you have sleep apnea, surgery and certain medicines can increase your risk for breathing problems. Follow instructions from your health care provider about wearing your sleep device: ? Anytime you are sleeping, including during daytime naps. ? While taking prescription pain medicines, sleeping medicines, or medicines that make you drowsy.  Return to your normal activities as told by your health care provider. Ask your health care provider what activities are safe for you.  Take over-the-counter and prescription medicines only as told by your health care provider.  If you smoke, do not smoke without supervision.  Keep all follow-up visits as told by your health care provider. This is important. Contact a health care  provider if:  You have nausea or vomiting that does not get better with medicine.  You cannot eat or drink without vomiting.  You have pain that does not get better with medicine.  You are unable to pass urine.  You develop a skin rash.  You have a fever.  You have redness around your IV site that gets worse. Get help right away if:  You have difficulty breathing.  You have chest pain.  You have blood in your urine or stool, or you vomit blood. Summary  After the procedure, it is common to have a sore throat or nausea. It is also common to feel tired.  Have a responsible adult stay with you for the first 24 hours after general anesthesia. It is important to have someone help care for you until you are awake and alert.  When you feel hungry, start by eating small amounts of foods that are soft and easy to digest (bland), such as toast. Gradually return to your regular diet.  Drink enough fluid to keep your urine pale yellow.  Return to your normal activities as told by your health care provider. Ask your  health care provider what activities are safe for you. This information is not intended to replace advice given to you by your health care provider. Make sure you discuss any questions you have with your health care provider. Document Released: 11/23/2000 Document Revised: 04/02/2017 Document Reviewed: 04/02/2017 Elsevier Interactive Patient Education  2019 [employer].

## 2018-11-01 NOTE — H&P (Signed)
[facility] History and Physical  Reason for Referral: Left inguinal hernia  Referring Physician:  Dr. Eulis Foster (ED)   [name] is a 68 y.o. male.  HPI: [name] is a very pleasant gentleman who was scheduled for surgery but then was admitted to the hospital with UTI and had to have his inguinal hernia surgery rescheduled. He presented original to [place] in January and was admitted a few days later for about 2 days. He has a history of HTN, Hypothyroidism, GERD, h/o stomach carcinoid s/p endoscopic excision who comes in with complaints of a left groin hernia that he has noted for about 6 months he says. He says that the left groin is swollen and there is a bulge and that it causes him discomfort at times. He says that he has normal BMs and eats and drinks normally. He has no obstructive symptoms. He takes pain medication for chronic knee pain after knee replacement.      Past Medical History:  Diagnosis Date  . Anemia    FeDA: GASTRIC POLYPS, B12; TCS 2008 EGD 2009, 2008-HB 11.1 MCV 83.6 CR 1.22, 2009 FERRITIN 102-22  . B12 deficiency   . Carcinoid tumor determined by biopsy of stomach 09/25/2014  . Chronic knee pain   . Diverticulosis of colon    Lower GI bleed 2008  . Essential hypertension   . GERD (gastroesophageal reflux disease)   . Gout   . Hepatomegaly    Hepatic steatosis  . History of alcohol abuse   . History of septic arthritis   . Hypothyroidism   . PUD   . Substance abuse [facility])          Past Surgical History:  Procedure Laterality Date  . BIOPSY  08/14/2014   Procedure: BIOPSY;  Surgeon: Danie Binder, MD;  Location: [facility];  Service: Endoscopy;;  . BIOPSY  03/12/2015   Procedure: BIOPSY;  Surgeon: Danie Binder, MD;  Location: [facility];  Service: Endoscopy;;  . BIOPSY  04/21/2016   Procedure: BIOPSY;  Surgeon: Danie Binder, MD;  Location: [facility];  Service: Endoscopy;;  bx's of antrum, body of stomach, fundus, and  cardia  . CHOLECYSTECTOMY    . COLONOSCOPY  2008 [facility] DJ   LGIB 2o to TICS, prep good  . COLONOSCOPY WITH PROPOFOL N/A 08/14/2014   SLF: 1. Four large colon polyps removed. 2. The left colon is redundant 3. Moderate diverticulosis throughout teh entire examined colon  . COLONOSCOPY WITH PROPOFOL N/A 11/06/2014   SLF: 8 small polyps removed. One large pedunculated polyp removed from the ascending colon, tubulovillous and tubular adenomas. Next colonoscopy March 2019  . COLONOSCOPY WITH PROPOFOL N/A 01/04/2018   Procedure: COLONOSCOPY WITH PROPOFOL;  Surgeon: Danie Binder, MD;  [facility]: [facility];  Service: Endoscopy;  Laterality: N/A;  10:00am  . ESOPHAGOGASTRODUODENOSCOPY  12/08/2007   OQH:UTML gastric polyps seen in the cardia and body of the stomach/Normal esophagus without evidence of Barrett's, mass, erosion/Normal duodenal bulb and second portion of the duodenum. Benign bx.  . ESOPHAGOGASTRODUODENOSCOPY (EGD) WITH PROPOFOL N/A 08/14/2014   SLF: 1. Heme postive stools due to gastric and colon polyps 2. Multiple gastric   . ESOPHAGOGASTRODUODENOSCOPY (EGD) WITH PROPOFOL N/A 03/12/2015   SLF: Multiple gastric nodules seen in gasric body/antrum. 2. Non-erosive gastritis ( inflammation) was found in the gastric antrum.   . ESOPHAGOGASTRODUODENOSCOPY (EGD) WITH PROPOFOL N/A 10/08/2015   Procedure: ESOPHAGOGASTRODUODENOSCOPY (EGD) WITH PROPOFOL;  Surgeon: Danie Binder, MD;  [facility];  Service: Endoscopy;  Laterality: N/A;  0945  . ESOPHAGOGASTRODUODENOSCOPY (EGD) WITH PROPOFOL N/A 04/21/2016   Procedure: ESOPHAGOGASTRODUODENOSCOPY (EGD) WITH PROPOFOL;  Surgeon: Danie Binder, MD;  [facility];  Service: Endoscopy;  Laterality: N/A;  730   . HAND SURGERY Right    fracture repair with plates  . JOINT REPLACEMENT Bilateral    knees  . POLYPECTOMY  08/14/2014   Procedure: POLYPECTOMY;  Surgeon: Danie Binder, MD;  Location: [facility];  Service:  Endoscopy;;  . POLYPECTOMY N/A 11/06/2014   Procedure: POLYPECTOMY;  Surgeon: Danie Binder, MD;  Location: [facility];  Service: Endoscopy;  Laterality: N/A;  Transverse Colon x3, Ascending Colon x2, Descending Colon x3  . REPLACEMENT TOTAL KNEE BILATERAL    . UPPER GASTROINTESTINAL ENDOSCOPY  APR 2009   INFLAMED HYPERPLASTIC POLYPS, CHRONIC GASTRITIS         Family History  Problem Relation Age of Onset  . Diabetes Mother   . Renal Disease Mother        failure  . Diabetes Father        'old age'  . Hypertension Sister   . Hypertension Brother   . Diabetes Brother   . Diabetes Sister   . Diabetes Sister   . Diabetes Brother   . Hypertension Brother   . Diabetes Brother        right BKA  . Kidney failure Brother        on dialysis  . Kidney failure Brother        on dialysis  . Diabetes Brother        bilateral BKA  . Colon polyps Neg Hx   . Colon cancer Neg Hx   . Pancreatic disease Neg Hx     Social History        Tobacco Use  . Smoking status: Never Smoker  . Smokeless tobacco: Never Used  . Tobacco comment: Quit x 10 years/ never smoked on regular basis  Substance Use Topics  . Alcohol use: Yes    Alcohol/week: 2.0 standard drinks    Types: 2 Shots of liquor per week    Comment: drinks on weekends, gin/vodka 1/5th.  . Drug use: No    Medications: I have reviewed the patient's current medications.      Allergies as of 10/27/2018      Reactions   Aspirin Other (See Comments)   REACTION: Diverticular Bleed         Medication List       Accurate as of October 27, 2018  2:17 PM. Always use your most recent med list.        acetaminophen 325 MG tablet Commonly known as:  TYLENOL Take 2 tablets (650 mg total) by mouth every 6 (six) hours as needed for mild pain, fever or headache (or Fever >/= 101).   amLODipine 5 MG tablet Commonly known as:  NORVASC Take 1 tablet (5 mg total) by mouth daily.    atorvastatin 20 MG tablet Commonly known as:  LIPITOR Take 1 tablet (20 mg total) by mouth daily.   colchicine 0.6 MG tablet Take 0.6 mg by mouth 3 (three) times daily.   HYDROcodone-acetaminophen 5-325 MG tablet Commonly known as:  NORCO/VICODIN One tablet every six hours as needed for acute pain.  Must last 14 days.   iron polysaccharides 150 MG capsule Commonly known as:  NIFEREX Take 1 capsule (150 mg total) by mouth daily.   levothyroxine  200 MCG tablet Commonly known as:  SYNTHROID, LEVOTHROID Take 1 tablet (200 mcg total) by mouth daily before breakfast.   metoprolol succinate 25 MG 24 hr tablet Commonly known as:  TOPROL-XL Take 1.5 tablets (37.5 mg total) by mouth daily.   omeprazole 20 MG capsule Commonly known as:  PRILOSEC TAKE 1 CAPSULE BY MOUTH ONCE DAILY.   tamsulosin 0.4 MG Caps capsule Commonly known as:  FLOMAX Take 1 capsule (0.4 mg total) by mouth daily after supper.   ULORIC 40 MG tablet Generic drug:  febuxostat TAKE 1 TABLET BY MOUTH ONCE A DAY.        ROS:  A comprehensive review of systems was negative except for: Gastrointestinal: positive for left groin bulge Musculoskeletal: positive for knee pain  Blood pressure 124/72, pulse 86, temperature 97.8 F (36.6 C), temperature source Temporal, resp. rate 20, weight 236 lb (107 kg). Physical Exam  Results: PET scan 2/18- Neuroendocrine tumor history IMPRESSION: 1. No evidence of well differentiated neuroendocrine tumor on skull base to thigh DOTATATE PET scan. 2. Specifically, no abnormal radiotracer accumulation within the stomach at site of prior polypectomy revealing neuroendocrine tumor. 3. No evidence of metastatic disease. 4. LEFT inguinal hernia as described above. 5. LEFT upper lobe pulmonary nodule. Non-contrast chest CT at 6-12 months is recommended. If the nodule is stable at time of repeat CT, then future CT at 18-24 months (from today's scan) is  considered optional for low-risk patients, but is recommended for high-risk patients. This recommendation follows the consensus statement: Guidelines for Management of Incidental Pulmonary Nodules Detected on CT Images:From the Fleischner Society 2017; published online before print (10.1148/radiol.2725366440).   Assessment & Plan:  [name] is a 68 y.o. male with a left inguinal hernia who would like to have this repaired. He was scheduled for surgery and then was admitted to the hospital with a UTI. He is doing well and has no complaints.  He did recently have follow up for his neuroendocrine tumor and was found to have some nodules in the lungs and his chromogranin A was elevated. He is going to be scheduled for an EGD with Dr. Oneida Alar in the upcoming weeks and a CT of the chest repeat in August.  I do not see any reason to not proceed with the inguinal hernia repair as this repair would still be needed separate from any procedure/ surgery for the stomach neuroendocrine tumor.   -OR for left inguinal hernia repair with mesh   Discussed the risk and benefits including, bleeding, infection, use of mesh, risk of recurrence, risk of nerve damage causing numbness or changes in sensation, risk of damage to the cord structures. The patient understands the risk and benefits of repair with mesh, and has decided to proceed.  We also discussed open versus laparoscopic surgery and the use of mesh. We discussed that I do open repairs with mesh, and that this is considered equivalent to laparoscopic surgery. We discussed reasons for opting for laparoscopic surgery including if a bilateral repair is needed or if a patient has a recurrence after an open repair.  All questions were answered to the satisfaction of the patient.   Virl Cagey 10/27/2018, 2:17 PM

## 2018-11-01 NOTE — Telephone Encounter (Signed)
PATIENT SCHEDULED  °

## 2018-11-03 ENCOUNTER — Encounter (HOSPITAL_COMMUNITY)
Admission: RE | Admit: 2018-11-03 | Discharge: 2018-11-03 | Disposition: A | Discharge status: Home / Self Care | Payer: Medicare Other | Source: Ambulatory Visit | Attending: General Surgery | Admitting: General Surgery

## 2018-11-03 ENCOUNTER — Encounter (HOSPITAL_COMMUNITY): Payer: Self-pay

## 2018-11-03 ENCOUNTER — Other Ambulatory Visit: Payer: Self-pay

## 2018-11-03 DIAGNOSIS — Z01818 Encounter for other preprocedural examination: Secondary | ICD-10-CM | POA: Insufficient documentation

## 2018-11-03 LAB — BASIC METABOLIC PANEL
Anion gap: 7 (ref 5–15)
BUN: 14 mg/dL (ref 8–23)
CO2: 25 mmol/L (ref 22–32)
Calcium: 9 mg/dL (ref 8.9–10.3)
Chloride: 106 mmol/L (ref 98–111)
Creatinine, Ser: 1.2 mg/dL (ref 0.61–1.24)
GFR calc Af Amer: >60 mL/min (ref >60)
Glucose, Bld: 112 mg/dL — ABNORMAL HIGH (ref 70–99)
Potassium: 3.4 mmol/L — ABNORMAL LOW (ref 3.5–5.1)
Sodium: 138 mmol/L (ref 135–145)

## 2018-11-03 LAB — CBC WITH DIFFERENTIAL/PLATELET
Abs Immature Granulocytes: 0.01 10*3/uL (ref 0.00–0.07)
Basophils Absolute: 0 10*3/uL (ref 0.0–0.1)
Basophils Relative: 1 %
Eosinophils Absolute: 0.7 10*3/uL — ABNORMAL HIGH (ref 0.0–0.5)
Eosinophils Relative: 10 %
HCT: 39 % (ref 39.0–52.0)
Hemoglobin: 12.4 g/dL — ABNORMAL LOW (ref 13.0–17.0)
IMMATURE GRANULOCYTES: 0 %
Lymphocytes Relative: 33 %
Lymphs Abs: 2.2 10*3/uL (ref 0.7–4.0)
MCH: 30.9 pg (ref 26.0–34.0)
MCHC: 31.8 g/dL (ref 30.0–36.0)
MCV: 97.3 fL (ref 80.0–100.0)
Monocytes Absolute: 0.7 10*3/uL (ref 0.1–1.0)
Monocytes Relative: 10 %
Neutro Abs: 3.2 10*3/uL (ref 1.7–7.7)
Neutrophils Relative %: 46 %
Platelets: 146 10*3/uL — ABNORMAL LOW (ref 150–400)
RBC: 4.01 MIL/uL — ABNORMAL LOW (ref 4.22–5.81)
RDW: 13.8 % (ref 11.5–15.5)
WBC: 6.8 10*3/uL (ref 4.0–10.5)
nRBC: 0 % (ref 0.0–0.2)

## 2018-11-09 ENCOUNTER — Other Ambulatory Visit: Payer: Self-pay

## 2018-11-09 ENCOUNTER — Ambulatory Visit (HOSPITAL_COMMUNITY)
Admission: RE | Admit: 2018-11-09 | Discharge: 2018-11-09 | Disposition: A | Discharge status: Home / Self Care | Payer: Medicare Other | Source: Ambulatory Visit | Attending: General Surgery | Admitting: General Surgery

## 2018-11-09 ENCOUNTER — Encounter (HOSPITAL_COMMUNITY): Payer: Self-pay | Admitting: *Deleted

## 2018-11-09 ENCOUNTER — Ambulatory Visit (HOSPITAL_COMMUNITY): Payer: Medicare Other | Admitting: Anesthesiology

## 2018-11-09 ENCOUNTER — Encounter (HOSPITAL_COMMUNITY)
Admission: RE | Disposition: A | Discharge status: Home / Self Care | Payer: Self-pay | Source: Ambulatory Visit | Attending: General Surgery

## 2018-11-09 DIAGNOSIS — D649 Anemia, unspecified: Secondary | ICD-10-CM | POA: Insufficient documentation

## 2018-11-09 DIAGNOSIS — K219 Gastro-esophageal reflux disease without esophagitis: Secondary | ICD-10-CM | POA: Insufficient documentation

## 2018-11-09 DIAGNOSIS — K4090 Unilateral inguinal hernia, without obstruction or gangrene, not specified as recurrent: Secondary | ICD-10-CM

## 2018-11-09 DIAGNOSIS — Z79890 Hormone replacement therapy (postmenopausal): Secondary | ICD-10-CM | POA: Diagnosis not present

## 2018-11-09 DIAGNOSIS — I10 Essential (primary) hypertension: Secondary | ICD-10-CM | POA: Insufficient documentation

## 2018-11-09 DIAGNOSIS — Z96653 Presence of artificial knee joint, bilateral: Secondary | ICD-10-CM | POA: Insufficient documentation

## 2018-11-09 DIAGNOSIS — M109 Gout, unspecified: Secondary | ICD-10-CM | POA: Diagnosis not present

## 2018-11-09 DIAGNOSIS — Z886 Allergy status to analgesic agent status: Secondary | ICD-10-CM | POA: Insufficient documentation

## 2018-11-09 DIAGNOSIS — Z79899 Other long term (current) drug therapy: Secondary | ICD-10-CM | POA: Insufficient documentation

## 2018-11-09 DIAGNOSIS — E039 Hypothyroidism, unspecified: Secondary | ICD-10-CM | POA: Insufficient documentation

## 2018-11-09 HISTORY — PX: INGUINAL HERNIA REPAIR: SHX194

## 2018-11-09 SURGERY — REPAIR, HERNIA, INGUINAL, ADULT
Anesthesia: General | Laterality: Left

## 2018-11-09 MED ORDER — PROPOFOL 10 MG/ML IV BOLUS
INTRAVENOUS | Status: DC | PRN
  Administered 2018-11-09: 80 mg via INTRAVENOUS
  Administered 2018-11-09: 170 mg via INTRAVENOUS

## 2018-11-09 MED ORDER — SUCCINYLCHOLINE CHLORIDE 200 MG/10ML IV SOSY
PREFILLED_SYRINGE | INTRAVENOUS | Status: AC
  Filled 2018-11-09: qty 10

## 2018-11-09 MED ORDER — ONDANSETRON HCL 4 MG/2ML IJ SOLN: INTRAMUSCULAR | Status: DC | PRN

## 2018-11-09 MED ORDER — LACTATED RINGERS IV SOLN
INTRAVENOUS | Status: DC
  Administered 2018-11-09: 08:00:00 via INTRAVENOUS

## 2018-11-09 MED ORDER — LIDOCAINE 2% (20 MG/ML) 5 ML SYRINGE
INTRAMUSCULAR | Status: AC
  Filled 2018-11-09: qty 5

## 2018-11-09 MED ORDER — SUGAMMADEX SODIUM 200 MG/2ML IV SOLN
INTRAVENOUS | Status: DC | PRN
  Administered 2018-11-09: 100 mg via INTRAVENOUS

## 2018-11-09 MED ORDER — CEFAZOLIN SODIUM-DEXTROSE 2-4 GM/100ML-% IV SOLN
2.0000 g | INTRAVENOUS | Status: AC
  Administered 2018-11-09: 2 g via INTRAVENOUS
  Filled 2018-11-09: qty 100

## 2018-11-09 MED ORDER — FENTANYL CITRATE (PF) 100 MCG/2ML IJ SOLN
INTRAMUSCULAR | Status: DC | PRN
  Administered 2018-11-09 (×2): 50 ug via INTRAVENOUS

## 2018-11-09 MED ORDER — ROCURONIUM BROMIDE 10 MG/ML (PF) SYRINGE
PREFILLED_SYRINGE | INTRAVENOUS | Status: AC
  Filled 2018-11-09: qty 10

## 2018-11-09 MED ORDER — OXYCODONE HCL 5 MG PO TABS: 5.0000 mg | ORAL_TABLET | ORAL | 0 refills | Status: DC | PRN

## 2018-11-09 MED ORDER — MIDAZOLAM HCL 2 MG/2ML IJ SOLN: 0.5000 mg | Freq: Once | INTRAMUSCULAR | Status: DC | PRN

## 2018-11-09 MED ORDER — DEXAMETHASONE SODIUM PHOSPHATE 4 MG/ML IJ SOLN: INTRAMUSCULAR | Status: DC | PRN

## 2018-11-09 MED ORDER — BUPIVACAINE LIPOSOME 1.3 % IJ SUSP
INTRAMUSCULAR | Status: DC | PRN
  Administered 2018-11-09: 20 mL

## 2018-11-09 MED ORDER — PROPOFOL 10 MG/ML IV BOLUS
INTRAVENOUS | Status: AC
  Filled 2018-11-09: qty 40

## 2018-11-09 MED ORDER — SUGAMMADEX SODIUM 500 MG/5ML IV SOLN
INTRAVENOUS | Status: AC
  Filled 2018-11-09: qty 5

## 2018-11-09 MED ORDER — GLYCOPYRROLATE PF 0.2 MG/ML IJ SOSY
PREFILLED_SYRINGE | INTRAMUSCULAR | Status: AC
  Filled 2018-11-09: qty 1

## 2018-11-09 MED ORDER — ROCURONIUM BROMIDE 100 MG/10ML IV SOLN
INTRAVENOUS | Status: DC | PRN
  Administered 2018-11-09: 30 mg via INTRAVENOUS
  Administered 2018-11-09: 10 mg via INTRAVENOUS

## 2018-11-09 MED ORDER — ONDANSETRON HCL 4 MG/2ML IJ SOLN
INTRAMUSCULAR | Status: DC | PRN
  Administered 2018-11-09: 4 mg via INTRAVENOUS

## 2018-11-09 MED ORDER — FENTANYL CITRATE (PF) 250 MCG/5ML IJ SOLN
INTRAMUSCULAR | Status: AC
  Filled 2018-11-09: qty 5

## 2018-11-09 MED ORDER — EPHEDRINE 5 MG/ML INJ
INTRAVENOUS | Status: AC
  Filled 2018-11-09: qty 20

## 2018-11-09 MED ORDER — DOCUSATE SODIUM 100 MG PO CAPS: 100.0000 mg | ORAL_CAPSULE | Freq: Two times a day (BID) | ORAL | 2 refills | Status: DC

## 2018-11-09 MED ORDER — CHLORHEXIDINE GLUCONATE CLOTH 2 % EX PADS: 6.0000 | MEDICATED_PAD | Freq: Once | CUTANEOUS | Status: DC

## 2018-11-09 MED ORDER — SUCCINYLCHOLINE CHLORIDE 20 MG/ML IJ SOLN
INTRAMUSCULAR | Status: DC | PRN
  Administered 2018-11-09: 160 mg via INTRAVENOUS

## 2018-11-09 MED ORDER — HYDROCODONE-ACETAMINOPHEN 7.5-325 MG PO TABS: 1.0000 | ORAL_TABLET | Freq: Once | ORAL | Status: DC | PRN

## 2018-11-09 MED ORDER — PROMETHAZINE HCL 25 MG/ML IJ SOLN: 6.2500 mg | INTRAMUSCULAR | Status: DC | PRN

## 2018-11-09 MED ORDER — ONDANSETRON HCL 4 MG/2ML IJ SOLN
INTRAMUSCULAR | Status: AC
  Filled 2018-11-09: qty 2

## 2018-11-09 MED ORDER — SODIUM CHLORIDE 0.9 % IR SOLN
Status: DC | PRN
  Administered 2018-11-09: 1

## 2018-11-09 MED ORDER — BUPIVACAINE LIPOSOME 1.3 % IJ SUSP
INTRAMUSCULAR | Status: AC
  Filled 2018-11-09: qty 20

## 2018-11-09 MED ORDER — EPINEPHRINE PF 1 MG/ML IJ SOLN
INTRAMUSCULAR | Status: AC
  Filled 2018-11-09: qty 1

## 2018-11-09 MED ORDER — HYDROMORPHONE HCL 1 MG/ML IJ SOLN
0.2500 mg | INTRAMUSCULAR | Status: DC | PRN
  Administered 2018-11-09: 0.5 mg via INTRAVENOUS
  Filled 2018-11-09 (×2): qty 0.5

## 2018-11-09 MED ORDER — GLYCOPYRROLATE PF 0.2 MG/ML IJ SOSY
PREFILLED_SYRINGE | INTRAMUSCULAR | Status: AC
  Filled 2018-11-09: qty 2

## 2018-11-09 SURGICAL SUPPLY — 41 items
ADH SKN CLS APL DERMABOND .7 (GAUZE/BANDAGES/DRESSINGS) ×1
CLOTH BEACON ORANGE TIMEOUT ST (SAFETY) ×3 IMPLANT
COVER LIGHT HANDLE STERIS (MISCELLANEOUS) ×6 IMPLANT
DERMABOND ADVANCED (GAUZE/BANDAGES/DRESSINGS) ×2
DERMABOND ADVANCED .7 DNX12 (GAUZE/BANDAGES/DRESSINGS) ×1 IMPLANT
DRAIN PENROSE 18X1/2 LTX STRL (DRAIN) ×3 IMPLANT
ELECT REM PT RETURN 9FT ADLT (ELECTROSURGICAL) ×3
ELECTRODE REM PT RTRN 9FT ADLT (ELECTROSURGICAL) ×1 IMPLANT
GLOVE BIO SURGEON STRL SZ 6.5 (GLOVE) ×2 IMPLANT
GLOVE BIO SURGEONS STRL SZ 6.5 (GLOVE) ×1
GLOVE BIOGEL M 7.0 STRL (GLOVE) ×4 IMPLANT
GLOVE BIOGEL PI IND STRL 6.5 (GLOVE) ×1 IMPLANT
GLOVE BIOGEL PI IND STRL 7.0 (GLOVE) ×3 IMPLANT
GLOVE BIOGEL PI INDICATOR 6.5 (GLOVE) ×6
GLOVE BIOGEL PI INDICATOR 7.0 (GLOVE) ×8
GOWN STRL REUS W/TWL LRG LVL3 (GOWN DISPOSABLE) ×9 IMPLANT
INST SET MINOR GENERAL (KITS) ×3 IMPLANT
KIT TURNOVER KIT A (KITS) ×3 IMPLANT
MANIFOLD NEPTUNE II (INSTRUMENTS) ×3 IMPLANT
MESH HERNIA 1.6X1.9 PLUG LRG (Mesh General) IMPLANT
MESH HERNIA PLUG LRG (Mesh General) ×4 IMPLANT
NDL HYPO 18GX1.5 BLUNT FILL (NEEDLE) ×1 IMPLANT
NDL HYPO 21X1.5 SAFETY (NEEDLE) ×1 IMPLANT
NEEDLE HYPO 18GX1.5 BLUNT FILL (NEEDLE) ×3 IMPLANT
NEEDLE HYPO 21X1.5 SAFETY (NEEDLE) ×3 IMPLANT
NS IRRIG 1000ML POUR BTL (IV SOLUTION) ×3 IMPLANT
PACK MINOR (CUSTOM PROCEDURE TRAY) ×3 IMPLANT
PAD ARMBOARD 7.5X6 YLW CONV (MISCELLANEOUS) ×3 IMPLANT
SET BASIN LINEN APH (SET/KITS/TRAYS/PACK) ×3 IMPLANT
SUT MNCRL AB 4-0 PS2 18 (SUTURE) ×3 IMPLANT
SUT NOVA NAB GS-22 2 2-0 T-19 (SUTURE) ×4 IMPLANT
SUT PROLENE 2 0 SH 30 (SUTURE) IMPLANT
SUT SILK 3 0 (SUTURE)
SUT SILK 3-0 18XBRD TIE 12 (SUTURE) IMPLANT
SUT VIC AB 2-0 CT1 27 (SUTURE) ×3
SUT VIC AB 2-0 CT1 TAPERPNT 27 (SUTURE) ×1 IMPLANT
SUT VIC AB 3-0 SH 27 (SUTURE) ×3
SUT VIC AB 3-0 SH 27X BRD (SUTURE) ×1 IMPLANT
SUT VICRYL AB 3 0 TIES (SUTURE) IMPLANT
SYR 20CC LL (SYRINGE) ×3 IMPLANT
SYR 30ML LL (SYRINGE) ×3 IMPLANT

## 2018-11-09 NOTE — Anesthesia Postprocedure Evaluation (Signed)
Anesthesia Post Note  Patient: [name]  Procedure(s) Performed: HERNIA REPAIR INGUINAL ADULT WITH MESH (Left )  Patient location during evaluation: PACU Anesthesia Type: General Level of consciousness: awake and alert and patient cooperative Pain management: pain level controlled Vital Signs Assessment: post-procedure vital signs reviewed and stable Respiratory status: spontaneous breathing, nonlabored ventilation and respiratory function stable Cardiovascular status: blood pressure returned to baseline Postop Assessment: no apparent nausea or vomiting Anesthetic complications: no     Last Vitals:  Vitals:   11/09/18 0720 11/09/18 1100  BP: 125/84 128/85  Pulse: 81 67  Resp: 20 15  Temp: 36.9 C 36.5 C  SpO2: 98% 98%    Last Pain:  Vitals:   11/09/18 1100  TempSrc:   PainSc: 0-No pain                 Akiva Josey J

## 2018-11-09 NOTE — Progress Notes (Signed)
Spoke with Crosby Oyster, aide of Mr. [place] by phone, stated that he will stay with Mr. Caseres for half the day, then his mother with spend the night. Notified Dr. Constance Haw of this communication with Mr. Samara Snide.

## 2018-11-09 NOTE — Discharge Instructions (Signed)
Discharge Instructions Hernia:  Common Complaints: Pain at the incision site is common. This will improve with time. Take your pain medications as described below. Some nausea is common and poor appetite. The main goal is to stay hydrated the first few days after surgery.  Numbness at the incision or the thigh is common.  If you start to have burning or tingling pain in your groin, this is from a nerve being pinched. Please call and we can prescribe you a different type of pain medication for nerve pain.   Diet/ Activity: Diet as tolerated. You may not have an appetite, but it is important to stay hydrated. Drink 64 ounces of water a day. Your appetite will return with time.  Shower per your regular routine daily.  Do not take hot showers. Take warm showers that are less than 10 minutes. Rest and listen to your body, but do not remain in bed all day.  Walk everyday for at least 15-20 minutes. Deep cough and move around every 1-2 hours in the first few days after surgery.  Do not pick at the dermabond glue on your incision sites.  This glue film will remain in place for 1-2 weeks and will start to peel off.  Do not place lotions or balms on your incision unless instructed to specifically by Dr. Constance Haw.  Do not lift > 10 lbs, perform excessive bending, pushing, pulling, squatting for 6-8 weeks after surgery.    Medication: Take tylenol and ibuprofen as needed for pain control, alternating every 4-6 hours.  Example:  Tylenol 1000mg  @ 6am, 12noon, 6pm, 70midnight (Do not exceed 4000mg  of tylenol a day). Ibuprofen 800mg  @ 9am, 3pm, 9pm, 3am (Do not exceed 3600mg  of ibuprofen a day).  Take Roxicodone for breakthrough pain every 4 hours.  Take Colace for constipation related to narcotic pain medication. If you do not have a bowel movement in 2 days, take Miralax over the counter.  Drink plenty of water to also prevent constipation.   Contact Information: If you have questions or concerns, please  call our office, [phone], Monday- Thursday 8AM-5PM and Friday 8AM-12Noon.  If it is after hours or on the weekend, please call [facility] Main [facility], [phone], and ask to speak to the surgeon on call for Dr. Constance Haw at [facility].    Open Hernia Repair, Adult, Care After These instructions give you information about caring for yourself after your procedure. Your doctor may also give you more specific instructions. If you have problems or questions, contact your doctor. Follow these instructions at home: Surgical cut (incision) care   Follow instructions from your doctor about how to take care of your surgical cut area. Make sure you: ? Wash your hands with soap and water before you change your bandage (dressing). If you cannot use soap and water, use hand sanitizer. ? Change your bandage as told by your doctor. ? Leave stitches (sutures), skin glue, or skin tape (adhesive) strips in place. They may need to stay in place for 2 weeks or longer. If tape strips get loose and curl up, you may trim the loose edges. Do not remove tape strips completely unless your doctor says it is okay.  Check your surgical cut every day for signs of infection. Check for: ? More redness, swelling, or pain. ? More fluid or blood. ? Warmth. ? Pus or a bad smell. Activity  Do not drive or use heavy machinery while taking prescription pain medicine. Do not drive until your doctor says  it is okay.  Until your doctor says it is okay: ? Do not lift anything that is heavier than 10 lb (4.5 kg). ? Do not play contact sports.  Return to your normal activities as told by your doctor. Ask your doctor what activities are safe. General instructions  To prevent or treat having a hard time pooping (constipation) while you are taking prescription pain medicine, your doctor may recommend that you: ? Drink enough fluid to keep your pee (urine) clear or pale yellow. ? Take over-the-counter or prescription  medicines. ? Eat foods that are high in fiber, such as fresh fruits and vegetables, whole grains, and beans. ? Limit foods that are high in fat and processed sugars, such as fried and sweet foods.  Take over-the-counter and prescription medicines only as told by your doctor.  Do not take baths, swim, or use a hot tub until your doctor says it is okay.  Keep all follow-up visits as told by your doctor. This is important.  YOU MAY SHOWER. Contact a doctor if:  You develop a rash.  You have more redness, swelling, or pain around your surgical cut.  You have more fluid or blood coming from your surgical cut.  Your surgical cut feels warm to the touch.  You have pus or a bad smell coming from your surgical cut.  You have a fever or chills.  You have blood in your poop (stool).  You have not pooped in 2-3 days.  Medicine does not help your pain. Get help right away if:  You have chest pain or you are short of breath.  You feel light-headed.  You feel weak and dizzy (feel faint).  You have very bad pain.  You throw up (vomit) and your pain is worse. This information is not intended to replace advice given to you by your health care provider. Make sure you discuss any questions you have with your health care provider. Document Released: 09/07/2014 Document Revised: 03/06/2016 Document Reviewed: 01/29/2016 Elsevier Interactive Patient Education  2019 [place], Care After These instructions provide you with information about caring for yourself after your procedure. Your health care provider may also give you more specific instructions. Your treatment has been planned according to current medical practices, but problems sometimes occur. Call your health care provider if you have any problems or questions after your procedure. What can I expect after the procedure? After your procedure, you may:  Feel sleepy for several hours.  Feel clumsy  and have poor balance for several hours.  Feel forgetful about what happened after the procedure.  Have poor judgment for several hours.  Feel nauseous or vomit.  Have a sore throat if you had a breathing tube during the procedure. Follow these instructions at home: For at least 24 hours after the procedure:      Have a responsible adult stay with you. It is important to have someone help care for you until you are awake and alert.  Rest as needed.  Do not: ? Participate in activities in which you could fall or become injured. ? Drive. ? Use heavy machinery. ? Drink alcohol. ? Take sleeping pills or medicines that cause drowsiness. ? Make important decisions or sign legal documents. ? Take care of children on your own. Eating and drinking  Follow the diet that is recommended by your health care provider.  If you vomit, drink water, juice, or soup when you can drink without vomiting.  Make sure you have little or no nausea before eating solid foods. General instructions  Take over-the-counter and prescription medicines only as told by your health care provider.  If you have sleep apnea, surgery and certain medicines can increase your risk for breathing problems. Follow instructions from your health care provider about wearing your sleep device: ? Anytime you are sleeping, including during daytime naps. ? While taking prescription pain medicines, sleeping medicines, or medicines that make you drowsy.  If you smoke, do not smoke without supervision.  Keep all follow-up visits as told by your health care provider. This is important. Contact a health care provider if:  You keep feeling nauseous or you keep vomiting.  You feel light-headed.  You develop a rash.  You have a fever. Get help right away if:  You have trouble breathing. Summary  For several hours after your procedure, you may feel sleepy and have poor judgment.  Have a responsible adult stay with you  for at least 24 hours or until you are awake and alert. This information is not intended to replace advice given to you by your health care provider. Make sure you discuss any questions you have with your health care provider. Document Released: 12/08/2015 Document Revised: 04/02/2017 Document Reviewed: 12/08/2015 Elsevier Interactive Patient Education  2019 [employer].

## 2018-11-09 NOTE — Anesthesia Preprocedure Evaluation (Signed)
Anesthesia Evaluation  Patient identified by MRN, date of birth, ID band Patient awake    Reviewed: Allergy & Precautions, NPO status , Patient's Chart, lab work & pertinent test results, reviewed documented beta blocker date and time   Airway Mallampati: II  TM Distance: >3 FB Neck ROM: Full    Dental no notable dental hx. (+) Chipped   Pulmonary neg pulmonary ROS,    Pulmonary exam normal breath sounds clear to auscultation       Cardiovascular Exercise Tolerance: Good hypertension, Pt. on medications negative cardio ROS Normal cardiovascular examI Rhythm:Regular Rate:Normal     Neuro/Psych negative neurological ROS  negative psych ROS   GI/Hepatic Neg liver ROS, PUD, GERD  Medicated and Controlled,  Endo/Other  Hypothyroidism   Renal/GU Renal InsufficiencyRenal disease  negative genitourinary   Musculoskeletal  (+) Arthritis , Osteoarthritis,    Abdominal   Peds negative pediatric ROS (+)  Hematology negative hematology ROS (+) anemia ,   Anesthesia Other Findings   Reproductive/Obstetrics negative OB ROS                             Anesthesia Physical Anesthesia Plan  ASA: II  Anesthesia Plan: General   Post-op Pain Management:    Induction: Intravenous  PONV Risk Score and Plan:   Airway Management Planned: LMA and Oral ETT  Additional Equipment:   Intra-op Plan:   Post-operative Plan: Extubation in OR  Informed Consent: I have reviewed the patients History and Physical, chart, labs and discussed the procedure including the risks, benefits and alternatives for the proposed anesthesia with the patient or authorized representative who has indicated his/her understanding and acceptance.     Dental advisory given  Plan Discussed with: CRNA  Anesthesia Plan Comments: (LMA vs ETT as needed )        Anesthesia Quick Evaluation

## 2018-11-09 NOTE — Interval H&P Note (Signed)
History and Physical Interval Note:  11/09/2018 8:58 AM  [name]  has presented today for surgery, with the diagnosis of left inguinal hernia.  The various methods of treatment have been discussed with the patient and family. After consideration of risks, benefits and other options for treatment, the patient has consented to  Procedure(s): HERNIA REPAIR INGUINAL ADULT WITH MESH (Left) as a surgical intervention.  The patient's history has been reviewed, patient examined, no change in status, stable for surgery.  I have reviewed the patient's chart and labs.  Questions were answered to the patient's satisfaction.    Plan for left hernia repair. Patient may not have anyone to stay with him for 24 hours. We are sorting this out.   Virl Cagey

## 2018-11-09 NOTE — Anesthesia Procedure Notes (Signed)
Procedure Name: Intubation Date/Time: 11/09/2018 9:14 AM Performed by: Charmaine Downs, CRNA Pre-anesthesia Checklist: Patient identified, Emergency Drugs available, Suction available and Patient being monitored Patient Re-evaluated:Patient Re-evaluated prior to induction Oxygen Delivery Method: Circle system utilized Preoxygenation: Pre-oxygenation with 100% oxygen Induction Type: IV induction, Rapid sequence and Cricoid Pressure applied Ventilation: Mask ventilation without difficulty and Oral airway inserted - appropriate to patient size Laryngoscope Size: Mac and 4 Grade View: Grade II Tube size: 8.0 mm Number of attempts: 1 Airway Equipment and Method: Stylet Placement Confirmation: ETT inserted through vocal cords under direct vision,  positive ETCO2 and breath sounds checked- equal and bilateral Secured at: 24 cm Tube secured with: Tape Dental Injury: Teeth and Oropharynx as per pre-operative assessment

## 2018-11-09 NOTE — Transfer of Care (Addendum)
Immediate Anesthesia Transfer of Care Note  Patient: Blandon Offerdahl  Procedure(s) Performed: HERNIA REPAIR INGUINAL ADULT WITH MESH (Left )  Patient Location: PACU  Anesthesia Type:General  Level of Consciousness: awake and patient cooperative  Airway & Oxygen Therapy: Patient Spontanous Breathing and Patient connected to face mask oxygen  Post-op Assessment: Report given to RN, Post -op Vital signs reviewed and stable and Patient moving all extremities  Post vital signs: Reviewed and stable  Last Vitals:  Vitals Value Taken Time  BP    Temp    Pulse    Resp    SpO2      Last Pain:  Vitals:   11/09/18 0720  TempSrc: Oral  PainSc: 0-No pain      Patients Stated Pain Goal: 6 (34/75/83 0746)  Complications: No apparent anesthesia complications

## 2018-11-09 NOTE — Op Note (Signed)
[facility] Operative Note  11/09/18  Preoperative Diagnosis: Left inguinal hernia    Postoperative Diagnosis: Indirect left inguinal hernia    Procedure(s) Performed: Left inguinal hernia repair with mesh   Surgeon: Lanell Matar. Constance Haw, MD   Assistants: Mercy Moore (Ke[place]); No qualified resident was available   Anesthesia: General endotracheal   Anesthesiologist: Dr. Hilaria Ota     Specimens: None    Estimated Blood Loss: Minimal   Blood Replacement: None    Complications: None   Wound Class: Clean    Operative Indications: Mr. Malerba is a 68 yo with a a history of a left inguinal hernia that has been getting larger and causing him discomfort. He was sent to me for further evaluation and plans for repair. We discussed the risk and benefits of surgery including but not limited to bleeding, infection, use of mesh, injury to the nerve, injury to the cord structures, and recurrence and he opted to proceed.   Findings: Large chronic left sided indirect hernia; stretched out and thinned external oblique    Procedure: The patient was taken to the operating room and placed supine. General endotracheal anesthesia was induced. Intravenous antibiotics were administered per protocol.  A time out was preformed verifying the correct patient, procedure, site, positioning and implants.  The left groin and scrotum were prepared and draped in the usual sterile fashion.   An incision was made in a natural skin crease between the pubic tubercle and the anterior superior iliac spine.  The incision was deepened with electrocautery through Scarpa's and Camper's fascia until the aponeurosis of the external oblique was encountered.  This was cleaned and the external ring was exposed.  An incision was made in the midportion of the external oblique aponeurosis, which was very thin and stretched out. Due to the thinning of the external oblique, I was not able to identified the  ilioinguinal nerve.  Attempt was made to create flaps of the external oblique were developed cephalad and inferiorly.    The cord was identified and it was gently dissented free at the pubic tubercle and encircled with a Penrose drain.  Attention was then directed at the anteromedial aspect of the cord, where an indirect hernia sac was identified.  The sac was carefully dissected free from the cord down to the level of the internal ring.  The vas and testicular vessels were identified and protected from harm.  Once the sac was dissected free from the cords, the Penrose was placed around the cord which was retracted inferiorly out of the field of view.  The hernia was reduced into the internal ring without difficulty.  Two large Perfix Plugs were placed into the defect and filled the space.  Attention was then turned to the floor of the canal, which was grossly weakened without any defined defect or sac.  The Perfix Mesh Patch was sutured to the inguinal ligament inferiorly starting at the pubic tubercle using 2-0 Novafil interrupted sutures.  The mesh was sutured superiorly to the conjoint tendon using 2-0 Novafil interrupted sutures.  Care was taken to ensure the mesh was placed in a relaxed fashion to avoid excessive tension and no neurovascular structures were caught in the repair.  Laterally the tails of the mesh were crossed and the internal ring was recreated, allowing for passage of cords without tension.   Hemostasis was adequate.  The Penrose was removed.  The thinned external oblique aponeurosis was closed with a 2-0 Vicryl suture in a running  fashion.  There was no obvious scarpa's fashion but the subcutaneous tissue was closed with 3-0 Vicryl interrupted sutures. The skin was closed with a subcuticular 4-0 Monocryl suture.  Dermabond was applied.   The testis was gently pulled down into its anatomic position in the scrotum.  The patient tolerated the procedure well and was taken to the PACU in  stable condition. All counts were correct at the end of the case.        Curlene Labrum, MD [facility] [address] [place], [place] [address] [phone] (office)

## 2018-11-10 ENCOUNTER — Encounter (HOSPITAL_COMMUNITY): Payer: Self-pay | Admitting: General Surgery

## 2018-11-14 ENCOUNTER — Telehealth: Payer: Self-pay | Admitting: Orthopaedic Surgery

## 2018-11-17 NOTE — Telephone Encounter (Signed)
No.  He got narcotics from another doctor.

## 2018-11-17 NOTE — Telephone Encounter (Signed)
Called patient notified.

## 2018-11-17 NOTE — Telephone Encounter (Signed)
Patient called (also received request via interface) regarding refill of HYDROcodone-acetaminophen 325 / Pharmacy: [facility]. Patient states he has finished Oxycodone prescribed by another provider.

## 2018-11-21 ENCOUNTER — Telehealth: Payer: Self-pay | Admitting: General Surgery

## 2018-11-21 MED ORDER — OXYCODONE HCL 5 MG PO TABS: 5.0000 mg | ORAL_TABLET | ORAL | 0 refills | Status: DC | PRN

## 2018-11-21 NOTE — Telephone Encounter (Signed)
[facility]  The patient is being called or seen in a Webex encounter due to the current Covid 19 virus and attempts at limiting patient clinic visits in the spirit of social distancing.    Mr. Meno had a left inguinal hernia on 11/09/18.  He says he is doing well. He is eating and drinking good. Having regular BMs. Incision is healing well. Dermabond is starting peeling off a little, told him he can trim.  Swollen, no redness in the incision. Pain has been ok but having to take pain medication. He is out of the roxicodone at this time.  He wants to see me tomorrow.  Will refill some roxicodone now given his pain. Will see tomorrow.  Curlene Labrum, MD [facility] [address] [place], [place] [address] [phone] (office)

## 2018-11-22 ENCOUNTER — Other Ambulatory Visit: Payer: Self-pay

## 2018-11-22 ENCOUNTER — Ambulatory Visit (INDEPENDENT_AMBULATORY_CARE_PROVIDER_SITE_OTHER): Payer: Self-pay | Admitting: General Surgery

## 2018-11-22 ENCOUNTER — Encounter: Payer: Self-pay | Admitting: General Surgery

## 2018-11-22 VITALS — BP 126/84 | HR 86 | Temp 97.3°F | Resp 20 | Wt 237.4 lb

## 2018-11-22 DIAGNOSIS — K4090 Unilateral inguinal hernia, without obstruction or gangrene, not specified as recurrent: Secondary | ICD-10-CM

## 2018-11-22 NOTE — Patient Instructions (Addendum)
Healing well. The swelling and hardness will continue to improve over the next 4-6 weeks. No lifting > 10 lbs, pushing, pulling, or excessive bending or squatting in the next 4-6 weeks.  You can start to lift more Jan 04, 2019.  Start to use more tylenol and ibuprofen for pain and less of the roxicodone.

## 2018-11-22 NOTE — Progress Notes (Signed)
[facility] Note   HPI:  68 y.o. Male presents to clinic for follow-up evaluation after left inguinal hernia repair. Patient reports he is doing well. No complaints. He was having some pain that required more roxicodone yesterday when I spoke with him on the phone so I called him in a Rx and he wanted to be seen today.   Review of Systems:  No fevers or chills No drainage or redness at the incision All other review of systems: otherwise negative   Vital Signs:  BP 126/84 (BP Location: Left Arm, Patient Position: Sitting, Cuff Size: Normal)   Pulse 86   Temp (!) 97.3 F (36.3 C) (Temporal)   Resp 20   Wt 237 lb 6.4 oz (107.7 kg)   BMI 32.20 kg/m    Physical Exam:  Physical Exam HENT:     Head: Normocephalic.  Cardiovascular:     Rate and Rhythm: Normal rate.  Pulmonary:     Effort: Pulmonary effort is normal.  Abdominal:     Comments: Left inguinal incisions c/d/i with no erythema or drainage, some induration and mild swelling, dermabond peeling    Assessment:  69 y.o. yo Male s/p left inguinal hernia repair with mesh. Doing well.  Plan:  Healing well. The swelling and hardness will continue to improve over the next 4-6 weeks. No lifting > 10 lbs, pushing, pulling, or excessive bending or squatting in the next 4-6 weeks.  You can start to lift more Jan 04, 2019.  Start to use more tylenol and ibuprofen for pain and less of the roxicodone.  PRN follow up   All of the above recommendations were discussed with the patient, and all of patient's questions were answered to his expressed satisfaction.  Curlene Labrum, MD [facility] [address] [place], [place] [address] [phone] (office)

## 2018-11-30 ENCOUNTER — Telehealth: Payer: Self-pay | Admitting: Orthopaedic Surgery

## 2018-11-30 NOTE — Telephone Encounter (Signed)
Hydrocodone-Acetaminophen 5/325 mg  Qty 35 Tablets  PATIENT USES [facility]  Patient received 15 Tablets of Oxycodone 5 MG on 11/21/18 by Dr. Jerrilyn Cairo states he had surgery and was given them for pain.

## 2018-12-01 MED ORDER — HYDROCODONE-ACETAMINOPHEN 5-325 MG PO TABS: ORAL_TABLET | ORAL | 0 refills | Status: DC

## 2018-12-08 ENCOUNTER — Encounter (HOSPITAL_COMMUNITY): Payer: Self-pay

## 2018-12-08 ENCOUNTER — Other Ambulatory Visit: Payer: Self-pay

## 2018-12-08 ENCOUNTER — Emergency Department (HOSPITAL_COMMUNITY)
Admission: EM | Admit: 2018-12-08 | Discharge: 2018-12-08 | Disposition: A | Discharge status: Home / Self Care | Payer: Medicare Other | Attending: Emergency Medicine | Admitting: Emergency Medicine

## 2018-12-08 ENCOUNTER — Emergency Department (HOSPITAL_COMMUNITY): Payer: Medicare Other

## 2018-12-08 ENCOUNTER — Telehealth: Payer: Self-pay | Admitting: General Surgery

## 2018-12-08 DIAGNOSIS — N452 Orchitis: Secondary | ICD-10-CM | POA: Diagnosis not present

## 2018-12-08 DIAGNOSIS — N5089 Other specified disorders of the male genital organs: Secondary | ICD-10-CM | POA: Diagnosis present

## 2018-12-08 DIAGNOSIS — Z79899 Other long term (current) drug therapy: Secondary | ICD-10-CM | POA: Insufficient documentation

## 2018-12-08 LAB — CBC WITH DIFFERENTIAL/PLATELET
Abs Immature Granulocytes: 0.01 10*3/uL (ref 0.00–0.07)
Basophils Absolute: 0 10*3/uL (ref 0.0–0.1)
Basophils Relative: 0 %
Eosinophils Absolute: 0.1 10*3/uL (ref 0.0–0.5)
Eosinophils Relative: 1 %
HCT: 39.1 % (ref 39.0–52.0)
Hemoglobin: 12.4 g/dL — ABNORMAL LOW (ref 13.0–17.0)
Immature Granulocytes: 0 %
Lymphocytes Relative: 20 %
Lymphs Abs: 1.4 10*3/uL (ref 0.7–4.0)
MCH: 30.7 pg (ref 26.0–34.0)
MCHC: 31.7 g/dL (ref 30.0–36.0)
MCV: 96.8 fL (ref 80.0–100.0)
Monocytes Absolute: 1.2 10*3/uL — ABNORMAL HIGH (ref 0.1–1.0)
Monocytes Relative: 16 %
Neutro Abs: 4.5 10*3/uL (ref 1.7–7.7)
Neutrophils Relative %: 63 %
Platelets: 111 10*3/uL — ABNORMAL LOW (ref 150–400)
RBC: 4.04 MIL/uL — ABNORMAL LOW (ref 4.22–5.81)
RDW: 14.2 % (ref 11.5–15.5)
WBC: 7.3 10*3/uL (ref 4.0–10.5)
nRBC: 0 % (ref 0.0–0.2)

## 2018-12-08 LAB — URINALYSIS, ROUTINE W REFLEX MICROSCOPIC
Bilirubin Urine: NEGATIVE
Glucose, UA: NEGATIVE mg/dL
Hgb urine dipstick: NEGATIVE
Ketones, ur: NEGATIVE mg/dL
Nitrite: POSITIVE — AB
Protein, ur: NEGATIVE mg/dL
Specific Gravity, Urine: 1.025 (ref 1.005–1.030)
WBC, UA: >50 WBC/hpf — ABNORMAL HIGH (ref 0–5)
pH: 5 (ref 5.0–8.0)

## 2018-12-08 LAB — BASIC METABOLIC PANEL
Anion gap: 10 (ref 5–15)
BUN: 18 mg/dL (ref 8–23)
CO2: 26 mmol/L (ref 22–32)
Calcium: 9.2 mg/dL (ref 8.9–10.3)
Chloride: 102 mmol/L (ref 98–111)
Creatinine, Ser: 1.27 mg/dL — ABNORMAL HIGH (ref 0.61–1.24)
GFR calc Af Amer: >60 mL/min (ref >60)
GFR calc non Af Amer: 57 mL/min — ABNORMAL LOW (ref >60)
Glucose, Bld: 115 mg/dL — ABNORMAL HIGH (ref 70–99)
Potassium: 3.8 mmol/L (ref 3.5–5.1)
Sodium: 138 mmol/L (ref 135–145)

## 2018-12-08 MED ORDER — LEVOFLOXACIN 500 MG PO TABS: 500.0000 mg | ORAL_TABLET | Freq: Every day | ORAL | 0 refills | Status: DC

## 2018-12-08 NOTE — Telephone Encounter (Signed)
[facility]  Patient called and said scrotum swollen since Monday. It is swollen on the left side. The scrotum is painful but no bruising.    Incision is not swollen or red. The scrotum is not red.   He does not recall doing anything to pull or injure himself.  He is eating and drinking and having Bms. No fevers or chills.   They are already at the ED now. Will see him in the ED. Plan for labs and CT to see if recurrence versus seroma/ hydrocele?   Curlene Labrum, MD [facility] [address] [place], [place] [address] [phone] (office)

## 2018-12-08 NOTE — ED Notes (Signed)
Advised patient we needed urine specimen. 

## 2018-12-08 NOTE — ED Triage Notes (Signed)
Pt had inguinal hernia repaired by Dr Constance Haw on 11/09/18. Pt has had swelling to the scrotum on the left side since Monday but bruising. Pt denies any swelling or redness to the incision.

## 2018-12-09 NOTE — ED Provider Notes (Signed)
[facility] EMERGENCY DEPARTMENT Provider Note   CSN: [id number] Arrival date & time: 12/08/18  1303    History   Chief Complaint Chief Complaint  Patient presents with  . Post-op Problem    HPI [name] is a 68 y.o. male.     HPI Patient presents with right scrotal swelling.  Recent inguinal hernia repair by Dr. Constance Haw.  It was done over a month ago.  He talked with her and was told to come in the ER for evaluation.  No fevers.  Over the last few days has had increased swelling on the right scrotum.  Although he had told her that it was on the left side.  No dysuria.  No penile discharge.  No fevers.  No nausea or vomiting.  Denies possibility of STD. Past Medical History:  Diagnosis Date  . Anemia    FeDA: GASTRIC POLYPS, B12; TCS 2008 EGD 2009, 2008-HB 11.1 MCV 83.6 CR 1.22, 2009 FERRITIN 102-22  . B12 deficiency   . Carcinoid tumor determined by biopsy of stomach 09/25/2014  . Chronic knee pain   . Diverticulosis of colon    Lower GI bleed 2008  . Essential hypertension   . GERD (gastroesophageal reflux disease)   . Gout   . Hepatomegaly    Hepatic steatosis  . History of alcohol abuse   . History of septic arthritis   . Hypothyroidism   . PUD   . Substance abuse [facility])     Patient Active Problem List   Diagnosis Date Noted  . UTI (urinary tract infection) 09/22/2018  . AKI (acute kidney injury) ([place]) 09/22/2018  . Left inguinal hernia 09/20/2018  . S/P total knee replacement, left 06/29/06 01/27/2018  . Hyperlipidemia 10/23/2016  . Mild intellectual disabilities (CODE) 09/29/2016  . Personal history of gout 09/22/2016  . Literacy level of illiterate 09/22/2016  . S/P TKR (total knee replacement), right 09/18/08 09/22/2016  . Carcinoid tumor determined by biopsy of stomach 04/10/2016  . Tubulovillous adenoma of colon 09/12/2015  . Neuroendocrine tumor 09/25/2014  . Elevated LFTs 07/17/2014  . Iron deficiency anemia 07/29/2010  . Fatty liver 07/29/2010   . Alcohol abuse 11/30/2008  . Osteoarthrosis involving lower leg 08/20/2008  . Essential hypertension 10/20/2006  . B12 deficiency 08/15/2006  . GERD 08/15/2006  . IBS 08/15/2006  . BPH without obstruction/lower urinary tract symptoms 08/15/2006  . OSTEOARTHRITIS 08/15/2006  . DEGENERATION, DISC NOS 08/15/2006    Past Surgical History:  Procedure Laterality Date  . BIOPSY  08/14/2014   Procedure: BIOPSY;  Surgeon: Danie Binder, MD;  Location: [facility];  Service: Endoscopy;;  . BIOPSY  03/12/2015   Procedure: BIOPSY;  Surgeon: Danie Binder, MD;  Location: [facility];  Service: Endoscopy;;  . BIOPSY  04/21/2016   Procedure: BIOPSY;  Surgeon: Danie Binder, MD;  Location: [facility];  Service: Endoscopy;;  bx's of antrum, body of stomach, fundus, and cardia  . CHOLECYSTECTOMY    . COLONOSCOPY  2008 [facility] DJ   LGIB 2o to TICS, prep good  . COLONOSCOPY WITH PROPOFOL N/A 08/14/2014   SLF: 1. Four large colon polyps removed. 2. The left colon is redundant 3. Moderate diverticulosis throughout teh entire examined colon  . COLONOSCOPY WITH PROPOFOL N/A 11/06/2014   SLF: 8 small polyps removed. One large pedunculated polyp removed from the ascending colon, tubulovillous and tubular adenomas. Next colonoscopy March 2019  . COLONOSCOPY WITH PROPOFOL N/A 01/04/2018   Procedure: COLONOSCOPY WITH PROPOFOL;  Surgeon:  Fields, Marga Melnick, MD;  Location: [facility];  Service: Endoscopy;  Laterality: N/A;  10:00am  . ESOPHAGOGASTRODUODENOSCOPY  12/08/2007   FFM:BWGY gastric polyps seen in the cardia and body of the stomach/Normal esophagus without evidence of Barrett's, mass, erosion/Normal duodenal bulb and second portion of the duodenum. Benign bx.  . ESOPHAGOGASTRODUODENOSCOPY (EGD) WITH PROPOFOL N/A 08/14/2014   SLF: 1. Heme postive stools due to gastric and colon polyps 2. Multiple gastric   . ESOPHAGOGASTRODUODENOSCOPY (EGD) WITH PROPOFOL N/A 03/12/2015   SLF: Multiple gastric nodules seen in gasric  body/antrum. 2. Non-erosive gastritis ( inflammation) was found in the gastric antrum.   . ESOPHAGOGASTRODUODENOSCOPY (EGD) WITH PROPOFOL N/A 10/08/2015   Procedure: ESOPHAGOGASTRODUODENOSCOPY (EGD) WITH PROPOFOL;  Surgeon: Danie Binder, MD;  [facility];  Service: Endoscopy;  Laterality: N/A;  0945  . ESOPHAGOGASTRODUODENOSCOPY (EGD) WITH PROPOFOL N/A 04/21/2016   Procedure: ESOPHAGOGASTRODUODENOSCOPY (EGD) WITH PROPOFOL;  Surgeon: Danie Binder, MD;  Location: [facility];  Service: Endoscopy;  Laterality: N/A;  730   . HAND SURGERY Right    fracture repair with plates  . INGUINAL HERNIA REPAIR Left 11/09/2018   Procedure: HERNIA REPAIR INGUINAL ADULT WITH MESH;  Surgeon: Virl Cagey, MD;  Location: [facility];  Service: General;  Laterality: Left;  . JOINT REPLACEMENT Bilateral    knees  . POLYPECTOMY  08/14/2014   Procedure: POLYPECTOMY;  Surgeon: Danie Binder, MD;  Location: [facility];  Service: Endoscopy;;  . POLYPECTOMY N/A 11/06/2014   Procedure: POLYPECTOMY;  Surgeon: Danie Binder, MD;  Location: [facility];  Service: Endoscopy;  Laterality: N/A;  Transverse Colon x3, Ascending Colon x2, Descending Colon x3  . REPLACEMENT TOTAL KNEE BILATERAL    . UPPER GASTROINTESTINAL ENDOSCOPY  APR 2009   INFLAMED HYPERPLASTIC POLYPS, CHRONIC GASTRITIS        Home Medications    Prior to Admission medications   Medication Sig Start Date End Date Taking? Authorizing Provider  acetaminophen (TYLENOL) 325 MG tablet Take 2 tablets (650 mg total) by mouth every 6 (six) hours as needed for mild pain, fever or headache (or Fever >/= 101). 09/24/18   Roxan Hockey, MD  amLODipine (NORVASC) 5 MG tablet Take 1 tablet (5 mg total) by mouth daily. 09/24/18   Roxan Hockey, MD  atorvastatin (LIPITOR) 20 MG tablet Take 1 tablet (20 mg total) by mouth daily. 10/23/16   Raylene Everts, MD  clindamycin (CLINDAGEL) 1 % gel Apply 1 application topically daily. Applies to back of neck     [provider]  colchicine 0.6 MG tablet TAKE 1 TABLET BY MOUTH 3 TIMES DAILY X5 DAYS FOR GOUT PAIN. 11/15/18   Sanjuana Kava, MD  docusate sodium (COLACE) 100 MG capsule Take 1 capsule (100 mg total) by mouth 2 (two) times daily. 11/09/18 11/09/19  Virl Cagey, MD  HYDROcodone-acetaminophen (NORCO/VICODIN) 5-325 MG tablet One tablet by mouth every six hours as needed for pain. 14 day limit 12/01/18   Sanjuana Kava, MD  iron polysaccharides (NIFEREX) 150 MG capsule Take 1 capsule (150 mg total) by mouth daily. 09/29/18   Derek Jack, MD  levofloxacin (LEVAQUIN) 500 MG tablet Take 1 tablet (500 mg total) by mouth daily. 12/08/18   Davonna Belling, MD  levothyroxine (SYNTHROID, LEVOTHROID) 200 MCG tablet Take 1 tablet (200 mcg total) by mouth daily before breakfast. 09/24/18   Roxan Hockey, MD  metoprolol succinate (TOPROL-XL) 25 MG 24 hr tablet Take 1.5 tablets (37.5 mg total) by mouth  daily. 09/24/18   Roxan Hockey, MD  omeprazole (PRILOSEC) 20 MG capsule TAKE 1 CAPSULE BY MOUTH ONCE DAILY. Patient taking differently: Take 20 mg by mouth daily.  11/19/17   Carlis Stable, NP  tamsulosin (FLOMAX) 0.4 MG CAPS capsule Take 1 capsule (0.4 mg total) by mouth daily after supper. Patient taking differently: Take 0.4 mg by mouth daily.  09/24/18   Emokpae, Courage, MD  ULORIC 40 MG tablet TAKE 1 TABLET BY MOUTH ONCE A DAY. Patient taking differently: Take 40 mg by mouth daily.  12/21/17   Raylene Everts, MD    Family History Family History  Problem Relation Age of Onset  . Diabetes Mother   . Renal Disease Mother        failure  . Diabetes Father        'old age'  . Hypertension Sister   . Hypertension Brother   . Diabetes Brother   . Diabetes Sister   . Diabetes Sister   . Diabetes Brother   . Hypertension Brother   . Diabetes Brother        right BKA  . Kidney failure Brother        on dialysis  . Kidney failure Brother        on dialysis  . Diabetes Brother         bilateral BKA  . Colon polyps Neg Hx   . Colon cancer Neg Hx   . Pancreatic disease Neg Hx     Social History Social History   Tobacco Use  . Smoking status: Never Smoker  . Smokeless tobacco: Never Used  . Tobacco comment: Quit x 10 years/ never smoked on regular basis  Substance Use Topics  . Alcohol use: Yes    Alcohol/week: 2.0 standard drinks    Types: 2 Shots of liquor per week    Comment: drinks on weekends, gin/vodka 1/5th.  . Drug use: No     Allergies   Aspirin   Review of Systems Review of Systems  Constitutional: Negative for appetite change.  HENT: Negative for congestion.   Respiratory: Negative for shortness of breath.   Cardiovascular: Negative for chest pain.  Gastrointestinal: Negative for abdominal pain.  Genitourinary: Positive for scrotal swelling.  Musculoskeletal: Negative for back pain.  Neurological: Negative for weakness.     Physical Exam Updated Vital Signs BP 135/86 (BP Location: Right Arm)   Pulse 100   Temp 99.7 F (37.6 C) (Oral)   Resp 19   Wt 107.6 kg   SpO2 96%   BMI 32.17 kg/m   Physical Exam Vitals signs and nursing note reviewed.  HENT:     Head: Normocephalic.     Mouth/Throat:     Mouth: Mucous membranes are moist.  Neck:     Musculoskeletal: Neck supple.  Cardiovascular:     Rate and Rhythm: Normal rate.  Pulmonary:     Effort: Pulmonary effort is normal.  Abdominal:     Tenderness: There is no abdominal tenderness.  Genitourinary:    Comments: The well-healing left inguinal hernia repair.  However right testicle appears large and swollen.  No hernia palpated.  No penile discharge. Skin:    General: Skin is warm.     Capillary Refill: Capillary refill takes less than 2 seconds.  Neurological:     General: No focal deficit present.     Mental Status: He is alert.      ED Treatments / Results  Labs (all labs ordered  are listed, but only abnormal results are displayed) Labs Reviewed  URINE  CULTURE - Abnormal; Notable for the following components:      Result Value   Culture >=100,000 COLONIES/mL GRAM NEGATIVE RODS (*)    All other components within normal limits  URINALYSIS, ROUTINE W REFLEX MICROSCOPIC - Abnormal; Notable for the following components:   Color, Urine AMBER (*)    APPearance HAZY (*)    Nitrite POSITIVE (*)    Leukocytes,Ua MODERATE (*)    WBC, UA >50 (*)    Bacteria, UA MANY (*)    All other components within normal limits  BASIC METABOLIC PANEL - Abnormal; Notable for the following components:   Glucose, Bld 115 (*)    Creatinine, Ser 1.27 (*)    GFR calc non Af Amer 57 (*)    All other components within normal limits  CBC WITH DIFFERENTIAL/PLATELET - Abnormal; Notable for the following components:   RBC 4.04 (*)    Hemoglobin 12.4 (*)    Platelets 111 (*)    Monocytes Absolute 1.2 (*)    All other components within normal limits    EKG None  Radiology US Scrotum W/doppler  Result Date: 12/08/2018 CLINICAL DATA:  Right scrotal pain and swelling. Left inguinal hernia repair on 11/09/2018. EXAM: SCROTAL ULTRASOUND DOPPLER ULTRASOUND OF THE TESTICLES TECHNIQUE: Complete ultrasound examination of the testicles, epididymis, and other scrotal structures was performed. Color and spectral Doppler ultrasound were also utilized to evaluate blood flow to the testicles. COMPARISON:  None. FINDINGS: Right testicle Measurements: 3.4 x 2.5 x 3.0 cm. Heterogeneous echogenicity with increased vascularity. No discrete mass. Left testicle Measurements: 3.0 x 2.1 x 3.0 cm. No mass or microlithiasis visualized. Right epididymis: Enlarged and heterogeneous with increased vascularity. Left epididymis:  Normal in size and appearance. Hydrocele:  Septated right-sided hydrocele.  Small left hydrocele. Varicocele:  None visualized. Pulsed Doppler interrogation of both testes demonstrates normal low resistance arterial and venous waveforms bilaterally. IMPRESSION: 1. Right  epididymitis/orchitis associated with a moderate septated right hydrocele. 2. No other acute abnormality. 3. No testicular mass or torsion. Electronically Signed   By: Lajean Manes M.D.   On: 12/08/2018 15:04    Procedures Procedures (including critical care time)  Medications Ordered in ED Medications - No data to display   Initial Impression / Assessment and Plan / ED Course  I have reviewed the triage vital signs and the nursing notes.  Pertinent labs & imaging results that were available during my care of the patient were reviewed by me and considered in my medical decision making (see chart for details).        Patient with right testicle swollen.  Appears to be an orchitis.  Culture is been sent and at the time of seeing him was not back.  Treated with Levaquin.  Follow-up as an outpatient as needed.  Final Clinical Impressions(s) / ED Diagnoses   Final diagnoses:  Orchitis of right testicle    ED Discharge Orders         Ordered    levofloxacin (LEVAQUIN) 500 MG tablet  Daily     12/08/18 1544           Davonna Belling, MD 12/09/18 1859

## 2018-12-10 LAB — URINE CULTURE: Culture: >=100000 — AB

## 2018-12-15 ENCOUNTER — Telehealth: Payer: Self-pay | Admitting: Orthopaedic Surgery

## 2018-12-15 DIAGNOSIS — I10 Essential (primary) hypertension: Secondary | ICD-10-CM | POA: Diagnosis not present

## 2018-12-15 DIAGNOSIS — E785 Hyperlipidemia, unspecified: Secondary | ICD-10-CM | POA: Diagnosis not present

## 2018-12-15 DIAGNOSIS — N452 Orchitis: Secondary | ICD-10-CM | POA: Diagnosis not present

## 2018-12-15 DIAGNOSIS — N451 Epididymitis: Secondary | ICD-10-CM | POA: Diagnosis not present

## 2018-12-15 MED ORDER — HYDROCODONE-ACETAMINOPHEN 5-325 MG PO TABS: ORAL_TABLET | ORAL | 0 refills | Status: DC

## 2018-12-15 NOTE — Telephone Encounter (Signed)
Hydrocodone-Acetaminophen  5/325 mg  Qty 30 Tablets  PATIENT USES [facility]

## 2018-12-16 ENCOUNTER — Encounter (HOSPITAL_COMMUNITY): Payer: Self-pay | Admitting: General Surgery

## 2018-12-26 ENCOUNTER — Telehealth: Payer: Self-pay | Admitting: Orthopaedic Surgery

## 2018-12-28 ENCOUNTER — Encounter: Payer: Self-pay | Admitting: Gastroenterology

## 2018-12-28 ENCOUNTER — Other Ambulatory Visit: Payer: Self-pay

## 2018-12-28 ENCOUNTER — Ambulatory Visit (INDEPENDENT_AMBULATORY_CARE_PROVIDER_SITE_OTHER): Payer: Medicare Other | Admitting: Gastroenterology

## 2018-12-28 DIAGNOSIS — K635 Polyp of colon: Secondary | ICD-10-CM | POA: Diagnosis not present

## 2018-12-28 DIAGNOSIS — D3A092 Benign carcinoid tumor of the stomach: Secondary | ICD-10-CM

## 2018-12-28 NOTE — Assessment & Plan Note (Signed)
SYMPTOMS CONTROLLED/RESOLVED. NO BRBPR OR MELENA OR ABDOMINAL PAIN.  DISCUSSED ABNL LABS. PT DECLINES EGD AT THIS TIME. FOLLOW UP IN 1 YEAR.  CALL WITH QUESTIONS OR CONCERNS.

## 2018-12-28 NOTE — Assessment & Plan Note (Signed)
NO WARNING SIGNS/SYMPTOMS.  REVIEWED TCS MAY 2019. NEXT TCS MAY 2024.

## 2018-12-28 NOTE — Progress Notes (Signed)
Subjective:    Patient ID: [name], male    DOB: 30-Oct-1950, 68 y.o.   MRN: [id number]    Primary Care Physician:  [name], MD  Primary GI:  Barney Drain, MD   Patient Location: home   Provider Location: [facility] office   Reason for Visit: GASTRIC CARCINOID/COLON POLYPS   Persons present on the virtual encounter, with roles: patient, myself (provider), [place] (update meds/allergies)   Total time (minutes) spent on medical discussion:   10 MINUTES   Due to COVID-19, visit was VIA TELEPHONE VISIT DUE TO COVID 19. VISIT IS CONDUCTED VIRTUALLY AND WAS REQUESTED BY PATIENT.   Virtual Visit via TELEPHONE   I connected with [name] and verified that I am speaking with the correct person using two identifiers.   I discussed the limitations, risks, security and privacy concerns of performing an evaluation and management service by telephone/video and the availability of in person appointments. I also discussed with the patient that there may be a patient responsible charge related to this service. The patient expressed understanding and agreed to proceed.  HPI No questions or concerns. WEIGHT: ~244 LBS. BMs: REGULAR. APPETITE: GOOD. PT WANTS TO THINK ABOUT AN UPPER ENDOSCOPY. LAST TCS MAY 2019-NO POLYPS.   PT DENIES FEVER, CHILLS, HEMATOCHEZIA, HEMATEMESIS, nausea, vomiting, melena, diarrhea, CHEST PAIN, SHORTNESS OF BREATH, CHANGE IN BOWEL IN HABITS, constipation, abdominal pain, problems swallowing, OR heartburn or indigestion.  Past Medical History:  Diagnosis Date  . Anemia    FeDA: GASTRIC POLYPS, B12; TCS 2008 EGD 2009, 2008-HB 11.1 MCV 83.6 CR 1.22, 2009 FERRITIN 102-22  . B12 deficiency   . Carcinoid tumor determined by biopsy of stomach 09/25/2014  . Chronic knee pain   . Diverticulosis of colon    Lower GI bleed 2008  . Essential hypertension   . GERD (gastroesophageal reflux disease)   . Gout   . Hepatomegaly    Hepatic steatosis  . History of  alcohol abuse   . History of septic arthritis   . Hypothyroidism   . PUD   . Substance abuse [facility])     Past Surgical History:  Procedure Laterality Date  . BIOPSY  08/14/2014   Procedure: BIOPSY;  Surgeon: Danie Binder, MD;  Location: [facility] ORS;  Service: Endoscopy;;  . BIOPSY  03/12/2015   Procedure: BIOPSY;  Surgeon: Danie Binder, MD;  Location: [facility];  Service: Endoscopy;;  . BIOPSY  04/21/2016   Procedure: BIOPSY;  Surgeon: Danie Binder, MD;  Location: [facility];  Service: Endoscopy;;  bx's of antrum, body of stomach, fundus, and cardia  . CHOLECYSTECTOMY    . COLONOSCOPY  2008 [facility] DJ   LGIB 2o to TICS, prep good  . COLONOSCOPY WITH PROPOFOL N/A 08/14/2014   SLF: 1. Four large colon polyps removed. 2. The left colon is redundant 3. Moderate diverticulosis throughout teh entire examined colon  . COLONOSCOPY WITH PROPOFOL N/A 11/06/2014   SLF: 8 small polyps removed. One large pedunculated polyp removed from the ascending colon, tubulovillous and tubular adenomas. Next colonoscopy March 2019  . COLONOSCOPY WITH PROPOFOL N/A 01/04/2018   Procedure: COLONOSCOPY WITH PROPOFOL;  Surgeon: Danie Binder, MD;  Location: [facility];  Service: Endoscopy;  Laterality: N/A;  10:00am  . ESOPHAGOGASTRODUODENOSCOPY  12/08/2007   WNU:UVOZ gastric polyps seen in the cardia and body of the stomach/Normal esophagus without evidence of Barrett's, mass, erosion/Normal duodenal bulb and second portion of the duodenum. Benign bx.  Marland Kitchen  ESOPHAGOGASTRODUODENOSCOPY (EGD) WITH PROPOFOL N/A 08/14/2014   SLF: 1. Heme postive stools due to gastric and colon polyps 2. Multiple gastric   . ESOPHAGOGASTRODUODENOSCOPY (EGD) WITH PROPOFOL N/A 03/12/2015   SLF: Multiple gastric nodules seen in gasric body/antrum. 2. Non-erosive gastritis ( inflammation) was found in the gastric antrum.   . ESOPHAGOGASTRODUODENOSCOPY (EGD) WITH PROPOFOL N/A 10/08/2015   Procedure: ESOPHAGOGASTRODUODENOSCOPY (EGD) WITH PROPOFOL;   Surgeon: Danie Binder, MD;  [facility];  Service: Endoscopy;  Laterality: N/A;  0945  . ESOPHAGOGASTRODUODENOSCOPY (EGD) WITH PROPOFOL N/A 04/21/2016   Procedure: ESOPHAGOGASTRODUODENOSCOPY (EGD) WITH PROPOFOL;  Surgeon: Danie Binder, MD;  Location[facility];  Service: Endoscopy;  Laterality: N/A;  730   . HAND SURGERY Right    fracture repair with plates  . INGUINAL HERNIA REPAIR Left 11/09/2018   Procedure: HERNIA REPAIR INGUINAL ADULT WITH MESH;  Surgeon: Virl Cagey, MD;  Location: [facility] ORS;  Service: General;  Laterality: Left;  . JOINT REPLACEMENT Bilateral    knees  . POLYPECTOMY  08/14/2014   Procedure: POLYPECTOMY;  Surgeon: Danie Binder, MD;  Location: [facility];  Service: Endoscopy;;  . POLYPECTOMY N/A 11/06/2014   Procedure: POLYPECTOMY;  Surgeon: Danie Binder, MD;  Location: [facility];  Service: Endoscopy;  Laterality: N/A;  Transverse Colon x3, Ascending Colon x2, Descending Colon x3  . REPLACEMENT TOTAL KNEE BILATERAL    . UPPER GASTROINTESTINAL ENDOSCOPY  APR 2009   INFLAMED HYPERPLASTIC POLYPS, CHRONIC GASTRITIS    Allergies  Allergen Reactions  . Aspirin Other (See Comments)    REACTION: Diverticular Bleed    Current Outpatient Medications  Medication Sig    . acetaminophen (TYLENOL) 325 MG tablet Take 2 tablets (650 mg total) by mouth every 6 (six) hours as needed for mild pain, fever or headache (or Fever >/= 101).    Marland Kitchen amLODipine (NORVASC) 5 MG tablet Take 1 tablet (5 mg total) by mouth daily.    Marland Kitchen atorvastatin (LIPITOR) 20 MG tablet Take 1 tablet (20 mg total) by mouth daily.    . clindamycin (CLINDAGEL) 1 % gel Apply 1 application topically daily. Applies to back of neck    . colchicine 0.6 MG tablet TAKE 1 TABLET BY MOUTH 3 TIMES DAILY X5 DAYS FOR GOUT PAIN.    Marland Kitchen docusate sodium (COLACE) 100 MG capsule Take 1 capsule (100 mg total) by mouth 2 (two) times daily.    Marland Kitchen HYDROcodone-acetaminophen (NORCO/VICODIN) 5-325 MG tablet One tablet by  mouth every six hours as needed for pain. 14 day limit    . iron polysaccharides (NIFEREX) 150 MG capsule Take 1 capsule (150 mg total) by mouth daily.    Marland Kitchen levothyroxine (SYNTHROID, LEVOTHROID) 200 MCG tablet Take 1 tablet (200 mcg total) by mouth daily before breakfast.    . metoprolol succinate (TOPROL-XL) 25 MG 24 hr tablet Take 1.5 tablets (37.5 mg total) by mouth daily.    Marland Kitchen omeprazole (PRILOSEC) 20 MG capsule TAKE 1 CAPSULE BY MOUTH ONCE DAILY. (Patient taking differently: Take 20 mg by mouth daily. )    . tamsulosin (FLOMAX) 0.4 MG CAPS capsule Take 1 capsule (0.4 mg total) by mouth daily after supper. (Patient taking differently: Take 0.4 mg by mouth daily. )    . ULORIC 40 MG tablet TAKE 1 TABLET BY MOUTH ONCE A DAY. (Patient taking differently: Take 40 mg by mouth daily. )    .       Review of Systems PER HPI OTHERWISE ALL  SYSTEMS ARE NEGATIVE.    Objective:   Physical Exam   TELEPHONE VISIT DUE TO COVID 19, VISIT IS CONDUCTED VIRTUALLY AND WAS REQUESTED BY PATIENT.     Assessment & Plan:

## 2018-12-29 MED ORDER — HYDROCODONE-ACETAMINOPHEN 5-325 MG PO TABS: ORAL_TABLET | ORAL | 0 refills | Status: DC

## 2018-12-29 NOTE — Telephone Encounter (Signed)
Patient called (also "interface surescripts" request received) for refill of: HYDROcodone-acetaminophen (NORCO/VICODIN) 5-325 MG tablet 30 tablet   - Pharmacy:  [facility]

## 2018-12-29 NOTE — Progress Notes (Signed)
ON RECALL  °

## 2018-12-29 NOTE — Progress Notes (Signed)
cc'd to pcp 

## 2019-01-11 ENCOUNTER — Other Ambulatory Visit: Payer: Self-pay

## 2019-01-11 ENCOUNTER — Ambulatory Visit: Payer: Self-pay | Admitting: Orthopaedic Surgery

## 2019-01-11 ENCOUNTER — Ambulatory Visit (INDEPENDENT_AMBULATORY_CARE_PROVIDER_SITE_OTHER): Payer: Medicare Other | Admitting: Orthopaedic Surgery

## 2019-01-11 ENCOUNTER — Encounter: Payer: Self-pay | Admitting: Orthopaedic Surgery

## 2019-01-11 DIAGNOSIS — G894 Chronic pain syndrome: Secondary | ICD-10-CM

## 2019-01-11 DIAGNOSIS — M1A0410 Idiopathic chronic gout, right hand, without tophus (tophi): Secondary | ICD-10-CM

## 2019-01-11 NOTE — Progress Notes (Signed)
Virtual Visit via Telephone Note  I connected with [name] on 01/11/19 at  9:10 AM EDT by telephone and verified that I am speaking with the correct person using two identifiers.  Location: Patient: home Provider: home   I discussed the limitations, risks, security and privacy concerns of performing an evaluation and management service by telephone and the availability of in person appointments. I also discussed with the patient that there may be a patient responsible charge related to this service. The patient expressed understanding and agreed to proceed.   History of Present Illness: He has chronic gout affecting his foot and knee.  He has been taking his medicine.  He also has chronic pain syndrome and is taking his pain medicine.  He has no new trauma.  He is due for refill next week.   Observations/Objective: Per above  Assessment and Plan: Encounter Diagnoses  Name Primary?  . 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 Chronic pain disorder      Follow Up Instructions: To see in office in three months.    Call if any problem.  Precautions discussed.      I discussed the assessment and treatment plan with the patient. The patient was provided an opportunity to ask questions and all were answered. The patient agreed with the plan and demonstrated an understanding of the instructions.   The patient was advised to call back or seek an in-person evaluation if the symptoms worsen or if the condition fails to improve as anticipated.  I provided 8 minutes of non-face-to-face time during this encounter.   Sanjuana Kava, MD

## 2019-01-12 ENCOUNTER — Ambulatory Visit: Payer: Medicare Other | Admitting: Orthopaedic Surgery

## 2019-01-17 ENCOUNTER — Telehealth: Payer: Self-pay | Admitting: Orthopaedic Surgery

## 2019-01-17 MED ORDER — HYDROCODONE-ACETAMINOPHEN 5-325 MG PO TABS: ORAL_TABLET | ORAL | 0 refills | Status: DC

## 2019-01-17 NOTE — Telephone Encounter (Signed)
Patient requests refill on Hydrocodone/Acetaminophen 5-325  Mgs.   Qty 30  Sig: One tablet by mouth every six hours as needed for pain. 14 day limit  Patient states he uses [facility]

## 2019-01-20 ENCOUNTER — Other Ambulatory Visit (HOSPITAL_COMMUNITY): Payer: Self-pay | Admitting: *Deleted

## 2019-01-20 DIAGNOSIS — E785 Hyperlipidemia, unspecified: Secondary | ICD-10-CM | POA: Diagnosis not present

## 2019-01-20 DIAGNOSIS — D509 Iron deficiency anemia, unspecified: Secondary | ICD-10-CM | POA: Diagnosis not present

## 2019-01-20 DIAGNOSIS — I10 Essential (primary) hypertension: Secondary | ICD-10-CM | POA: Diagnosis not present

## 2019-01-20 MED ORDER — POLYSACCHARIDE IRON COMPLEX 150 MG PO CAPS: 150.0000 mg | ORAL_CAPSULE | Freq: Every day | ORAL | 1 refills | Status: DC

## 2019-01-20 MED FILL — FERREX 150 CAPSULE: 150 | 90 days supply | Qty: 90 | Fill #0

## 2019-01-25 DIAGNOSIS — E785 Hyperlipidemia, unspecified: Secondary | ICD-10-CM | POA: Diagnosis not present

## 2019-01-25 DIAGNOSIS — I10 Essential (primary) hypertension: Secondary | ICD-10-CM | POA: Diagnosis not present

## 2019-01-25 DIAGNOSIS — E039 Hypothyroidism, unspecified: Secondary | ICD-10-CM | POA: Diagnosis not present

## 2019-02-01 ENCOUNTER — Other Ambulatory Visit: Payer: Self-pay

## 2019-02-01 ENCOUNTER — Ambulatory Visit (INDEPENDENT_AMBULATORY_CARE_PROVIDER_SITE_OTHER): Payer: Medicare Other

## 2019-02-01 ENCOUNTER — Other Ambulatory Visit: Payer: Self-pay | Admitting: Orthopaedic Surgery

## 2019-02-01 ENCOUNTER — Encounter: Payer: Self-pay | Admitting: Orthopedic Surgery

## 2019-02-01 ENCOUNTER — Ambulatory Visit (INDEPENDENT_AMBULATORY_CARE_PROVIDER_SITE_OTHER): Payer: Medicare Other | Admitting: Orthopedic Surgery

## 2019-02-01 VITALS — BP 138/86 | HR 90 | Temp 98.8°F | Ht 72.0 in | Wt 238.0 lb

## 2019-02-01 DIAGNOSIS — Z96651 Presence of right artificial knee joint: Secondary | ICD-10-CM | POA: Diagnosis not present

## 2019-02-01 DIAGNOSIS — Z96652 Presence of left artificial knee joint: Secondary | ICD-10-CM | POA: Diagnosis not present

## 2019-02-01 NOTE — Progress Notes (Signed)
ANNUAL FOLLOW UP FOR  bilateral TKA   Chief Complaint  Patient presents with  . Routine Post Op    Rt TKR DOS 09/18/08   left tka 2007  HPI: The patient is here for the annual  follow-up x-ray for knee replacements. The patient is not complaining of pain weakness instability or stiffness in the repaired knee.     Review of Systems  Musculoskeletal:       Pain right hand status post fall       Examination of the left and right KNEE  BP 138/86   Pulse 90   Temp 98.8 F (37.1 C)   Ht 6' (1.829 m)   Wt 238 lb (108 kg)   BMI 32.28 kg/m   General the patient is normally groomed in no distress  Inspection shows : incisions healed nicely without erythema, no tenderness no swelling, 2 incisions on the left knee from her prior MCL open repair with staplorrhaphy    Range of motion total range of motion is right knee 120 left knee 115  Stability the knees is stable anterior to posterior as well as medial to lateral  Strength quadriceps strength is normal both knees  Skin no erythema around the skin incision both incisions  Neuro: normal sensation  Gait: abnormal with cane  Medical decision-making section X-rays ordered, internal imaging shows (see full dictated report) stable implant with no signs of loosening, left knee at 12 years stable with varus tibial tray right knee stable no loosening Diagnosis  Encounter Diagnoses  Name Primary?  . S/P TKR (total knee replacement), right 09/18/08 Yes  . S/P total knee replacement, left 06/29/06      Plan patient may return as needed  Addendum patient complained of pain in his right hand I examined his hand there is no malalignment no tenderness he does have a volar incision from her prior plate fixation for fracture he has a swollen area near the radial styloid which is chronic there appears to be a skin incision there from either an I&D or maybe an aspiration  Full range of motion

## 2019-02-03 NOTE — Telephone Encounter (Signed)
Refill request initially received via interface on 02/01/19 for medication: HYDROcodone-acetaminophen (NORCO/VICODIN) 5-325 MG tablet 30 tablet  - [facility]

## 2019-02-08 ENCOUNTER — Other Ambulatory Visit: Payer: Self-pay | Admitting: Cardiology

## 2019-02-10 ENCOUNTER — Other Ambulatory Visit: Payer: Self-pay | Admitting: Cardiology

## 2019-02-23 ENCOUNTER — Telehealth: Payer: Self-pay | Admitting: Orthopaedic Surgery

## 2019-02-23 MED ORDER — HYDROCODONE-ACETAMINOPHEN 5-325 MG PO TABS: ORAL_TABLET | ORAL | 0 refills | Status: DC

## 2019-02-23 NOTE — Telephone Encounter (Signed)
Patient  requests refill HYDROcodone-acetaminophen (NORCO/VICODIN) 5-325 MG tablet [facility] 

## 2019-02-23 NOTE — Addendum Note (Signed)
Addended by: Willette Pa on: 02/23/2019 11:10 AM   Modules accepted: Orders

## 2019-03-05 ENCOUNTER — Other Ambulatory Visit: Payer: Self-pay

## 2019-03-05 ENCOUNTER — Emergency Department (HOSPITAL_COMMUNITY)
Admission: EM | Admit: 2019-03-05 | Discharge: 2019-03-05 | Disposition: A | Discharge status: Home / Self Care | Payer: Medicare Other | Attending: Emergency Medicine | Admitting: Emergency Medicine

## 2019-03-05 ENCOUNTER — Encounter (HOSPITAL_COMMUNITY): Payer: Self-pay | Admitting: Emergency Medicine

## 2019-03-05 DIAGNOSIS — Z79899 Other long term (current) drug therapy: Secondary | ICD-10-CM | POA: Insufficient documentation

## 2019-03-05 DIAGNOSIS — I10 Essential (primary) hypertension: Secondary | ICD-10-CM | POA: Diagnosis not present

## 2019-03-05 DIAGNOSIS — E039 Hypothyroidism, unspecified: Secondary | ICD-10-CM | POA: Diagnosis not present

## 2019-03-05 DIAGNOSIS — R630 Anorexia: Secondary | ICD-10-CM | POA: Diagnosis not present

## 2019-03-05 DIAGNOSIS — Z96653 Presence of artificial knee joint, bilateral: Secondary | ICD-10-CM | POA: Insufficient documentation

## 2019-03-05 DIAGNOSIS — Z87891 Personal history of nicotine dependence: Secondary | ICD-10-CM | POA: Insufficient documentation

## 2019-03-05 LAB — CBC WITH DIFFERENTIAL/PLATELET
Abs Immature Granulocytes: 0.01 10*3/uL (ref 0.00–0.07)
Basophils Absolute: 0 10*3/uL (ref 0.0–0.1)
Basophils Relative: 1 %
Eosinophils Absolute: 0.4 10*3/uL (ref 0.0–0.5)
Eosinophils Relative: 8 %
HCT: 39.5 % (ref 39.0–52.0)
Hemoglobin: 12.4 g/dL — ABNORMAL LOW (ref 13.0–17.0)
Immature Granulocytes: 0 %
Lymphocytes Relative: 52 %
Lymphs Abs: 2.7 10*3/uL (ref 0.7–4.0)
MCH: 30.8 pg (ref 26.0–34.0)
MCHC: 31.4 g/dL (ref 30.0–36.0)
MCV: 98.3 fL (ref 80.0–100.0)
Monocytes Absolute: 0.4 10*3/uL (ref 0.1–1.0)
Monocytes Relative: 8 %
Neutro Abs: 1.6 10*3/uL — ABNORMAL LOW (ref 1.7–7.7)
Neutrophils Relative %: 31 %
Platelets: 117 10*3/uL — ABNORMAL LOW (ref 150–400)
RBC: 4.02 MIL/uL — ABNORMAL LOW (ref 4.22–5.81)
RDW: 15.1 % (ref 11.5–15.5)
WBC: 5.1 10*3/uL (ref 4.0–10.5)
nRBC: 0 % (ref 0.0–0.2)

## 2019-03-05 LAB — COMPREHENSIVE METABOLIC PANEL
ALT: 30 U/L (ref 0–44)
AST: 48 U/L — ABNORMAL HIGH (ref 15–41)
Albumin: 3.7 g/dL (ref 3.5–5.0)
Alkaline Phosphatase: 61 U/L (ref 38–126)
Anion gap: 14 (ref 5–15)
BUN: 11 mg/dL (ref 8–23)
CO2: 22 mmol/L (ref 22–32)
Calcium: 8.6 mg/dL — ABNORMAL LOW (ref 8.9–10.3)
Chloride: 107 mmol/L (ref 98–111)
Creatinine, Ser: 1.07 mg/dL (ref 0.61–1.24)
GFR calc Af Amer: >60 mL/min (ref >60)
GFR calc non Af Amer: >60 mL/min (ref >60)
Glucose, Bld: 77 mg/dL (ref 70–99)
Potassium: 3.6 mmol/L (ref 3.5–5.1)
Sodium: 143 mmol/L (ref 135–145)
Total Bilirubin: 0.6 mg/dL (ref 0.3–1.2)
Total Protein: 7.2 g/dL (ref 6.5–8.1)

## 2019-03-05 LAB — URINALYSIS, ROUTINE W REFLEX MICROSCOPIC
Bilirubin Urine: NEGATIVE
Glucose, UA: NEGATIVE mg/dL
Ketones, ur: NEGATIVE mg/dL
Nitrite: NEGATIVE
Protein, ur: NEGATIVE mg/dL
Specific Gravity, Urine: 1.003 — ABNORMAL LOW (ref 1.005–1.030)
pH: 5 (ref 5.0–8.0)

## 2019-03-05 LAB — LIPASE, BLOOD: Lipase: 39 U/L (ref 11–51)

## 2019-03-05 NOTE — ED Provider Notes (Signed)
[facility] EMERGENCY DEPARTMENT Provider Note   CSN: [id number] Arrival date & time: 03/05/19  1531    History   Chief Complaint Chief Complaint  Patient presents with  . Anorexia    HPI [name] is a 68 y.o. male with a history of hypertension, GERD, hepatic steatosis, alcohol abuse reporting he last drank alcohol 5 days ago, hypothyroidism peptic ulcer disease and history of carcinoid tumor of his stomach presenting with a 5-day history of decreased appetite.  He states he last ate a cheeseburger yesterday, but had to force it down.  5 days ago he went out to eat, ordered a steak and just could not eat it as he did not have an appetite.  He denies fevers or chills, denies abdominal pain, no nausea or vomiting, also no loss of smell or taste, no bowel changes.  He does note increased urinary frequency but reports decreased p.o. fluid intake as well.  He reports generalized weakness without focal symptoms.  He has had no treatment for his symptoms prior to arrival.  He reports weight loss but unable to quantify.    The history is provided by the patient.    Past Medical History:  Diagnosis Date  . Anemia    FeDA: GASTRIC POLYPS, B12; TCS 2008 EGD 2009, 2008-HB 11.1 MCV 83.6 CR 1.22, 2009 FERRITIN 102-22  . B12 deficiency   . Carcinoid tumor determined by biopsy of stomach 09/25/2014  . Chronic knee pain   . Diverticulosis of colon    Lower GI bleed 2008  . Essential hypertension   . GERD (gastroesophageal reflux disease)   . Gout   . Hepatomegaly    Hepatic steatosis  . History of alcohol abuse   . History of septic arthritis   . Hypothyroidism   . PUD   . Substance abuse [facility])     Patient Active Problem List   Diagnosis Date Noted  . Colon polyps 12/28/2018  . UTI (urinary tract infection) 09/22/2018  . AKI (acute kidney injury) ([place]) 09/22/2018  . Left inguinal hernia 09/20/2018  . S/P total knee replacement, left 06/29/06 01/27/2018  . Hyperlipidemia 10/23/2016   . Mild intellectual disabilities (CODE) 09/29/2016  . Personal history of gout 09/22/2016  . Literacy level of illiterate 09/22/2016  . S/P TKR (total knee replacement), right 09/18/08 09/22/2016  . Carcinoid tumor determined by biopsy of stomach 04/10/2016  . Tubulovillous adenoma of colon 09/12/2015  . Neuroendocrine tumor 09/25/2014  . Elevated LFTs 07/17/2014  . Iron deficiency anemia 07/29/2010  . Fatty liver 07/29/2010  . Alcohol abuse 11/30/2008  . Osteoarthrosis involving lower leg 08/20/2008  . Essential hypertension 10/20/2006  . B12 deficiency 08/15/2006  . GERD 08/15/2006  . IBS 08/15/2006  . BPH without obstruction/lower urinary tract symptoms 08/15/2006  . OSTEOARTHRITIS 08/15/2006  . DEGENERATION, DISC NOS 08/15/2006    Past Surgical History:  Procedure Laterality Date  . BIOPSY  08/14/2014   Procedure: BIOPSY;  Surgeon: Danie Binder, MD;  Location: [facility];  Service: Endoscopy;;  . BIOPSY  03/12/2015   Procedure: BIOPSY;  Surgeon: Danie Binder, MD;  Location: [facility];  Service: Endoscopy;;  . BIOPSY  04/21/2016   Procedure: BIOPSY;  Surgeon: Danie Binder, MD;  Location: [facility];  Service: Endoscopy;;  bx's of antrum, body of stomach, fundus, and cardia  . CHOLECYSTECTOMY    . COLONOSCOPY  2008 [facility] DJ   LGIB 2o to TICS, prep good  . COLONOSCOPY WITH PROPOFOL N/A  08/14/2014   SLF: 1. Four large colon polyps removed. 2. The left colon is redundant 3. Moderate diverticulosis throughout teh entire examined colon  . COLONOSCOPY WITH PROPOFOL N/A 11/06/2014   SLF: 8 small polyps removed. One large pedunculated polyp removed from the ascending colon, tubulovillous and tubular adenomas. Next colonoscopy March 2019  . COLONOSCOPY WITH PROPOFOL N/A 01/04/2018   Procedure: COLONOSCOPY WITH PROPOFOL;  Surgeon: Danie Binder, MD;  Location: [facility];  Service: Endoscopy;  Laterality: N/A;  10:00am  . ESOPHAGOGASTRODUODENOSCOPY  12/08/2007   XHB:ZJIR gastric  polyps seen in the cardia and body of the stomach/Normal esophagus without evidence of Barrett's, mass, erosion/Normal duodenal bulb and second portion of the duodenum. Benign bx.  . ESOPHAGOGASTRODUODENOSCOPY (EGD) WITH PROPOFOL N/A 08/14/2014   SLF: 1. Heme postive stools due to gastric and colon polyps 2. Multiple gastric   . ESOPHAGOGASTRODUODENOSCOPY (EGD) WITH PROPOFOL N/A 03/12/2015   SLF: Multiple gastric nodules seen in gasric body/antrum. 2. Non-erosive gastritis ( inflammation) was found in the gastric antrum.   . ESOPHAGOGASTRODUODENOSCOPY (EGD) WITH PROPOFOL N/A 10/08/2015   Procedure: ESOPHAGOGASTRODUODENOSCOPY (EGD) WITH PROPOFOL;  Surgeon: Danie Binder, MD;  [facility];  Service: Endoscopy;  Laterality: N/A;  0945  . ESOPHAGOGASTRODUODENOSCOPY (EGD) WITH PROPOFOL N/A 04/21/2016   Procedure: ESOPHAGOGASTRODUODENOSCOPY (EGD) WITH PROPOFOL;  Surgeon: Danie Binder, MD;  Location[facility];  Service: Endoscopy;  Laterality: N/A;  730   . HAND SURGERY Right    fracture repair with plates  . INGUINAL HERNIA REPAIR Left 11/09/2018   Procedure: HERNIA REPAIR INGUINAL ADULT WITH MESH;  Surgeon: Virl Cagey, MD;  Location: [facility];  Service: General;  Laterality: Left;  . JOINT REPLACEMENT Bilateral    knees  . POLYPECTOMY  08/14/2014   Procedure: POLYPECTOMY;  Surgeon: Danie Binder, MD;  Location: [facility];  Service: Endoscopy;;  . POLYPECTOMY N/A 11/06/2014   Procedure: POLYPECTOMY;  Surgeon: Danie Binder, MD;  Location: [facility];  Service: Endoscopy;  Laterality: N/A;  Transverse Colon x3, Ascending Colon x2, Descending Colon x3  . REPLACEMENT TOTAL KNEE BILATERAL    . UPPER GASTROINTESTINAL ENDOSCOPY  APR 2009   INFLAMED HYPERPLASTIC POLYPS, CHRONIC GASTRITIS        Home Medications    Prior to Admission medications   Medication Sig Start Date End Date Taking? Authorizing Provider  amLODipine (NORVASC) 5 MG tablet Take 1 tablet (5 mg total) by mouth  daily. 09/24/18  Yes Emokpae, Courage, MD  atorvastatin (LIPITOR) 20 MG tablet Take 1 tablet (20 mg total) by mouth daily. 10/23/16  Yes Raylene Everts, MD  clindamycin (CLINDAGEL) 1 % gel Apply 1 application topically daily. Applies to back of neck   Yes [provider]  colchicine 0.6 MG tablet TAKE 1 TABLET BY MOUTH 3 TIMES DAILY X5 DAYS FOR GOUT PAIN. Patient taking differently: Take 0.6 mg by mouth See admin instructions. Take one tablet by mouth three times daily for 5 days as needed for gout pain 12/26/18  Yes Sanjuana Kava, MD  HYDROcodone-acetaminophen (NORCO/VICODIN) 5-325 MG tablet TAKE (1) TABLET BY MOUTH EVERY 6 HOURS AS NEEDED FOR ACUTE PAIN. MUSTLAST 14 DAYS. Patient taking differently: Take 1 tablet by mouth every 6 (six) hours as needed for moderate pain.  02/23/19  Yes Sanjuana Kava, MD  iron polysaccharides (NIFEREX) 150 MG capsule Take 1 capsule (150 mg total) by mouth daily. 01/20/19  Yes Derek Jack, MD  levothyroxine (SYNTHROID, LEVOTHROID) 200 MCG tablet  Take 1 tablet (200 mcg total) by mouth daily before breakfast. 09/24/18  Yes Emokpae, Courage, MD  metoprolol succinate (TOPROL-XL) 25 MG 24 hr tablet TAKE 1 & 1/2 TABLETS BY MOUTH DAILY. Patient taking differently: Take 37.5 mg by mouth daily.  02/10/19  Yes Branch, Alphonse Guild, MD  omeprazole (PRILOSEC) 20 MG capsule TAKE 1 CAPSULE BY MOUTH ONCE DAILY. Patient taking differently: Take 20 mg by mouth daily.  11/19/17  Yes Carlis Stable, NP  tamsulosin (FLOMAX) 0.4 MG CAPS capsule Take 1 capsule (0.4 mg total) by mouth daily after supper. Patient taking differently: Take 0.4 mg by mouth daily.  09/24/18  Yes Emokpae, Courage, MD  ULORIC 40 MG tablet TAKE 1 TABLET BY MOUTH ONCE A DAY. Patient taking differently: Take 40 mg by mouth daily.  12/21/17  Yes Raylene Everts, MD  docusate sodium (COLACE) 100 MG capsule Take 1 capsule (100 mg total) by mouth 2 (two) times daily. Patient not taking: Reported on  02/01/2019 11/09/18 11/09/19  Virl Cagey, MD  levofloxacin (LEVAQUIN) 500 MG tablet Take 1 tablet (500 mg total) by mouth daily. Patient not taking: Reported on 12/28/2018 12/08/18   Davonna Belling, MD    Family History Family History  Problem Relation Age of Onset  . Diabetes Mother   . Renal Disease Mother        failure  . Diabetes Father        'old age'  . Hypertension Sister   . Hypertension Brother   . Diabetes Brother   . Diabetes Sister   . Diabetes Sister   . Diabetes Brother   . Hypertension Brother   . Diabetes Brother        right BKA  . Kidney failure Brother        on dialysis  . Kidney failure Brother        on dialysis  . Diabetes Brother        bilateral BKA  . Colon polyps Neg Hx   . Colon cancer Neg Hx   . Pancreatic disease Neg Hx     Social History Social History   Tobacco Use  . Smoking status: Former Smoker    Years: 0.00  . Smokeless tobacco: Never Used  . Tobacco comment: Quit x 10 years/ never smoked on regular basis  Substance Use Topics  . Alcohol use: Yes    Alcohol/week: 2.0 standard drinks    Types: 2 Shots of liquor per week    Comment: drinks on weekends, gin/vodka 1/5th.  . Drug use: No     Allergies   Aspirin   Review of Systems Review of Systems  Constitutional: Positive for appetite change, fatigue and unexpected weight change. Negative for chills and fever.  HENT: Negative for congestion.   Eyes: Negative.   Respiratory: Negative for chest tightness and shortness of breath.   Cardiovascular: Negative for chest pain.  Gastrointestinal: Negative for abdominal pain, nausea and vomiting.  Genitourinary: Positive for frequency. Negative for dysuria, hematuria and urgency.  Musculoskeletal: Negative for arthralgias, joint swelling and neck pain.  Skin: Negative.  Negative for rash and wound.  Neurological: Negative for dizziness, weakness, light-headedness, numbness and headaches.  Psychiatric/Behavioral: Negative.       Physical Exam Updated Vital Signs BP (!) 155/96   Pulse 78   Temp 98.4 F (36.9 C) (Oral)   Resp 18   SpO2 92%   Physical Exam Vitals signs and nursing note reviewed.  Constitutional:  Appearance: He is well-developed.  HENT:     Head: Normocephalic and atraumatic.  Eyes:     Conjunctiva/sclera: Conjunctivae normal.  Neck:     Musculoskeletal: Normal range of motion.  Cardiovascular:     Rate and Rhythm: Normal rate and regular rhythm.     Heart sounds: Normal heart sounds.  Pulmonary:     Effort: Pulmonary effort is normal.     Breath sounds: Normal breath sounds. No wheezing.  Abdominal:     General: Bowel sounds are normal. There is no distension.     Palpations: Abdomen is soft.     Tenderness: There is abdominal tenderness in the right upper quadrant, epigastric area and left upper quadrant. There is no guarding.  Musculoskeletal: Normal range of motion.  Skin:    General: Skin is warm and dry.  Neurological:     Mental Status: He is alert.      ED Treatments / Results  Labs (all labs ordered are listed, but only abnormal results are displayed) Labs Reviewed  CBC WITH DIFFERENTIAL/PLATELET - Abnormal; Notable for the following components:      Result Value   RBC 4.02 (*)    Hemoglobin 12.4 (*)    Platelets 117 (*)    Neutro Abs 1.6 (*)    All other components within normal limits  COMPREHENSIVE METABOLIC PANEL - Abnormal; Notable for the following components:   Calcium 8.6 (*)    AST 48 (*)    All other components within normal limits  URINALYSIS, ROUTINE W REFLEX MICROSCOPIC - Abnormal; Notable for the following components:   Color, Urine STRAW (*)    Specific Gravity, Urine 1.003 (*)    Hgb urine dipstick SMALL (*)    Leukocytes,Ua TRACE (*)    Bacteria, UA MANY (*)    All other components within normal limits  URINE CULTURE  LIPASE, BLOOD    EKG None  Radiology No results found.  Procedures Procedures (including critical care  time)  Medications Ordered in ED Medications - No data to display   Initial Impression / Assessment and Plan / ED Course  I have reviewed the triage vital signs and the nursing notes.  Pertinent labs & imaging results that were available during my care of the patient were reviewed by me and considered in my medical decision making (see chart for details).        Pt with h/o gastric carcinoid tumor followed by Dr Oneida Alar.  5 day h/o poor appetite, denies any other sx, no n/v, no abd pain but with discomfort upper abd on exam, no acute abd findings.  Labs stable, symptom less bacteruria, urine cx sent.  Sx free at time of dc, advised close f/u with GI.  He refused EGD which was recommended at last visit with Dr. Oneida Alar, pt encouraged to call for f/u and to consider this important test.   Final Clinical Impressions(s) / ED Diagnoses   Final diagnoses:  Anorexia    ED Discharge Orders    None       Landis Martins 03/05/19 2024    Milton Ferguson, MD 03/05/19 2040

## 2019-03-05 NOTE — ED Triage Notes (Addendum)
Pt c/o lack of appetite for the past few days. Endorses mild generalized weakness due to not eating. Pt reports that he is drinking fluids. Pt denies n/v/d. Denies GU symptoms or fever.

## 2019-03-06 ENCOUNTER — Emergency Department (HOSPITAL_COMMUNITY)
Admission: EM | Admit: 2019-03-06 | Discharge: 2019-03-06 | Discharge status: Against Medical Advice | Payer: Medicare Other | Attending: Emergency Medicine | Admitting: Emergency Medicine

## 2019-03-06 ENCOUNTER — Encounter (HOSPITAL_COMMUNITY): Payer: Self-pay | Admitting: Emergency Medicine

## 2019-03-06 ENCOUNTER — Other Ambulatory Visit: Payer: Self-pay

## 2019-03-06 DIAGNOSIS — R531 Weakness: Secondary | ICD-10-CM | POA: Diagnosis present

## 2019-03-06 DIAGNOSIS — R110 Nausea: Secondary | ICD-10-CM | POA: Insufficient documentation

## 2019-03-06 DIAGNOSIS — Z5321 Procedure and treatment not carried out due to patient leaving prior to being seen by health care provider: Secondary | ICD-10-CM | POA: Diagnosis not present

## 2019-03-06 NOTE — ED Notes (Signed)
Called no answer

## 2019-03-06 NOTE — ED Triage Notes (Signed)
Patient complains of mild weakness and nausea. Patient seen for the same yesterday. NAD in triage.

## 2019-03-07 ENCOUNTER — Other Ambulatory Visit: Payer: Self-pay

## 2019-03-07 ENCOUNTER — Emergency Department (HOSPITAL_COMMUNITY): Payer: Medicare Other

## 2019-03-07 ENCOUNTER — Emergency Department (HOSPITAL_COMMUNITY)
Admission: EM | Admit: 2019-03-07 | Discharge: 2019-03-07 | Disposition: A | Discharge status: Home / Self Care | Payer: Medicare Other | Attending: Emergency Medicine | Admitting: Emergency Medicine

## 2019-03-07 ENCOUNTER — Encounter (HOSPITAL_COMMUNITY): Payer: Self-pay | Admitting: Emergency Medicine

## 2019-03-07 DIAGNOSIS — E860 Dehydration: Secondary | ICD-10-CM

## 2019-03-07 DIAGNOSIS — N3000 Acute cystitis without hematuria: Secondary | ICD-10-CM | POA: Diagnosis not present

## 2019-03-07 DIAGNOSIS — R531 Weakness: Secondary | ICD-10-CM | POA: Insufficient documentation

## 2019-03-07 DIAGNOSIS — Z79899 Other long term (current) drug therapy: Secondary | ICD-10-CM | POA: Diagnosis not present

## 2019-03-07 DIAGNOSIS — E039 Hypothyroidism, unspecified: Secondary | ICD-10-CM | POA: Diagnosis not present

## 2019-03-07 DIAGNOSIS — Z96653 Presence of artificial knee joint, bilateral: Secondary | ICD-10-CM | POA: Insufficient documentation

## 2019-03-07 DIAGNOSIS — K4090 Unilateral inguinal hernia, without obstruction or gangrene, not specified as recurrent: Secondary | ICD-10-CM | POA: Diagnosis not present

## 2019-03-07 DIAGNOSIS — Z85020 Personal history of malignant carcinoid tumor of stomach: Secondary | ICD-10-CM | POA: Diagnosis not present

## 2019-03-07 DIAGNOSIS — Z87891 Personal history of nicotine dependence: Secondary | ICD-10-CM | POA: Insufficient documentation

## 2019-03-07 LAB — BASIC METABOLIC PANEL
Anion gap: 17 — ABNORMAL HIGH (ref 5–15)
BUN: 11 mg/dL (ref 8–23)
CO2: 25 mmol/L (ref 22–32)
Calcium: 9.5 mg/dL (ref 8.9–10.3)
Chloride: 99 mmol/L (ref 98–111)
Creatinine, Ser: 1.13 mg/dL (ref 0.61–1.24)
GFR calc Af Amer: >60 mL/min (ref >60)
GFR calc non Af Amer: >60 mL/min (ref >60)
Glucose, Bld: 102 mg/dL — ABNORMAL HIGH (ref 70–99)
Potassium: 3.8 mmol/L (ref 3.5–5.1)
Sodium: 141 mmol/L (ref 135–145)

## 2019-03-07 LAB — CBC
HCT: 45.2 % (ref 39.0–52.0)
Hemoglobin: 14.5 g/dL (ref 13.0–17.0)
MCH: 30.9 pg (ref 26.0–34.0)
MCHC: 32.1 g/dL (ref 30.0–36.0)
MCV: 96.2 fL (ref 80.0–100.0)
Platelets: 119 10*3/uL — ABNORMAL LOW (ref 150–400)
RBC: 4.7 MIL/uL (ref 4.22–5.81)
RDW: 15 % (ref 11.5–15.5)
WBC: 5.7 10*3/uL (ref 4.0–10.5)
nRBC: 0 % (ref 0.0–0.2)

## 2019-03-07 LAB — URINALYSIS, ROUTINE W REFLEX MICROSCOPIC
Bilirubin Urine: NEGATIVE
Glucose, UA: NEGATIVE mg/dL
Ketones, ur: 20 mg/dL — AB
Nitrite: NEGATIVE
Protein, ur: 100 mg/dL — AB
Specific Gravity, Urine: 1.018 (ref 1.005–1.030)
WBC, UA: >50 WBC/hpf — ABNORMAL HIGH (ref 0–5)
pH: 5 (ref 5.0–8.0)

## 2019-03-07 LAB — PHOSPHORUS: Phosphorus: 2.7 mg/dL (ref 2.5–4.6)

## 2019-03-07 LAB — MAGNESIUM: Magnesium: 1.7 mg/dL (ref 1.7–2.4)

## 2019-03-07 MED ORDER — SODIUM CHLORIDE 0.9 % IV BOLUS
1000.0000 mL | Freq: Once | INTRAVENOUS | Status: AC
  Administered 2019-03-07: 1000 mL via INTRAVENOUS

## 2019-03-07 MED ORDER — FAMOTIDINE IN NACL 20-0.9 MG/50ML-% IV SOLN
20.0000 mg | Freq: Once | INTRAVENOUS | Status: AC
  Administered 2019-03-07: 20 mg via INTRAVENOUS
  Filled 2019-03-07: qty 50

## 2019-03-07 MED ORDER — SODIUM CHLORIDE 0.9 % IV BOLUS: 500.0000 mL | Freq: Once | INTRAVENOUS | Status: DC

## 2019-03-07 MED ORDER — FAMOTIDINE 20 MG PO TABS: 20.0000 mg | ORAL_TABLET | Freq: Every day | ORAL | 0 refills | Status: DC

## 2019-03-07 MED ORDER — CEPHALEXIN 500 MG PO CAPS: 500.0000 mg | ORAL_CAPSULE | Freq: Four times a day (QID) | ORAL | 0 refills | Status: AC

## 2019-03-07 MED ORDER — IOHEXOL 300 MG/ML  SOLN
100.0000 mL | Freq: Once | INTRAMUSCULAR | Status: AC | PRN
  Administered 2019-03-07: 100 mL via INTRAVENOUS

## 2019-03-07 NOTE — ED Notes (Signed)
Pt is aware a urine sample is needed. Urinal and call bell at bedside.

## 2019-03-07 NOTE — ED Provider Notes (Signed)
[facility] EMERGENCY DEPARTMENT Provider Note   CSN: [id number] Arrival date & time: 03/07/19  1041    History   Chief Complaint Chief Complaint  Patient presents with  . Weakness    HPI [name] is a 68 y.o. male presenting for evaluation of weakness and decreased appetite.   Pt states for the past 3 days, he has had a poor appetite. He denies nausea or abd pain, but states he has to force himself to eat. He was able to eat a cheeseburger and fries today. He states he is getting generally weaker, and this is what brought him in. He denies fevers, chills, cp, sob, abd pain, n/v, reflux/heartburn sxs, urinary sxs, or abnormal BMs. He has a h/o carcinoid tumor of stomach, has not had EDG with GI. He sees Dr. Oneida Alar with [facility].      HPI  Past Medical History:  Diagnosis Date  . Anemia    FeDA: GASTRIC POLYPS, B12; TCS 2008 EGD 2009, 2008-HB 11.1 MCV 83.6 CR 1.22, 2009 FERRITIN 102-22  . B12 deficiency   . Carcinoid tumor determined by biopsy of stomach 09/25/2014  . Chronic knee pain   . Diverticulosis of colon    Lower GI bleed 2008  . Essential hypertension   . GERD (gastroesophageal reflux disease)   . Gout   . Hepatomegaly    Hepatic steatosis  . History of alcohol abuse   . History of septic arthritis   . Hypothyroidism   . PUD   . Substance abuse [facility])     Patient Active Problem List   Diagnosis Date Noted  . Colon polyps 12/28/2018  . UTI (urinary tract infection) 09/22/2018  . AKI (acute kidney injury) ([place]) 09/22/2018  . Left inguinal hernia 09/20/2018  . S/P total knee replacement, left 06/29/06 01/27/2018  . Hyperlipidemia 10/23/2016  . Mild intellectual disabilities (CODE) 09/29/2016  . Personal history of gout 09/22/2016  . Literacy level of illiterate 09/22/2016  . S/P TKR (total knee replacement), right 09/18/08 09/22/2016  . Carcinoid tumor determined by biopsy of stomach 04/10/2016  . Tubulovillous adenoma of colon 09/12/2015  .  Neuroendocrine tumor 09/25/2014  . Elevated LFTs 07/17/2014  . Iron deficiency anemia 07/29/2010  . Fatty liver 07/29/2010  . Alcohol abuse 11/30/2008  . Osteoarthrosis involving lower leg 08/20/2008  . Essential hypertension 10/20/2006  . B12 deficiency 08/15/2006  . GERD 08/15/2006  . IBS 08/15/2006  . BPH without obstruction/lower urinary tract symptoms 08/15/2006  . OSTEOARTHRITIS 08/15/2006  . DEGENERATION, DISC NOS 08/15/2006    Past Surgical History:  Procedure Laterality Date  . BIOPSY  08/14/2014   Procedure: BIOPSY;  Surgeon: Danie Binder, MD;  Location: [facility] ORS;  Service: Endoscopy;;  . BIOPSY  03/12/2015   Procedure: BIOPSY;  Surgeon: Danie Binder, MD;  Location: [facility] ORS;  Service: Endoscopy;;  . BIOPSY  04/21/2016   Procedure: BIOPSY;  Surgeon: Danie Binder, MD;  Location: [facility] ENDO SUITE;  Service: Endoscopy;;  bx's of antrum, body of stomach, fundus, and cardia  . CHOLECYSTECTOMY    . COLONOSCOPY  2008 [facility] DJ   LGIB 2o to TICS, prep good  . COLONOSCOPY WITH PROPOFOL N/A 08/14/2014   SLF: 1. Four large colon polyps removed. 2. The left colon is redundant 3. Moderate diverticulosis throughout teh entire examined colon  . COLONOSCOPY WITH PROPOFOL N/A 11/06/2014   SLF: 8 small polyps removed. One large pedunculated polyp removed from the ascending colon, tubulovillous and tubular adenomas. Next  colonoscopy March 2019  . COLONOSCOPY WITH PROPOFOL N/A 01/04/2018   Procedure: COLONOSCOPY WITH PROPOFOL;  Surgeon: Danie Binder, MD;  Location: [facility];  Service: Endoscopy;  Laterality: N/A;  10:00am  . ESOPHAGOGASTRODUODENOSCOPY  12/08/2007   HUD:JSHF gastric polyps seen in the cardia and body of the stomach/Normal esophagus without evidence of Barrett's, mass, erosion/Normal duodenal bulb and second portion of the duodenum. Benign bx.  . ESOPHAGOGASTRODUODENOSCOPY (EGD) WITH PROPOFOL N/A 08/14/2014   SLF: 1. Heme postive stools due to gastric and colon polyps 2.  Multiple gastric   . ESOPHAGOGASTRODUODENOSCOPY (EGD) WITH PROPOFOL N/A 03/12/2015   SLF: Multiple gastric nodules seen in gasric body/antrum. 2. Non-erosive gastritis ( inflammation) was found in the gastric antrum.   . ESOPHAGOGASTRODUODENOSCOPY (EGD) WITH PROPOFOL N/A 10/08/2015   Procedure: ESOPHAGOGASTRODUODENOSCOPY (EGD) WITH PROPOFOL;  Surgeon: Danie Binder, MD;  [facility];  Service: Endoscopy;  Laterality: N/A;  0945  . ESOPHAGOGASTRODUODENOSCOPY (EGD) WITH PROPOFOL N/A 04/21/2016   Procedure: ESOPHAGOGASTRODUODENOSCOPY (EGD) WITH PROPOFOL;  Surgeon: Danie Binder, MD;  Location: [facility];  Service: Endoscopy;  Laterality: N/A;  730   . HAND SURGERY Right    fracture repair with plates  . INGUINAL HERNIA REPAIR Left 11/09/2018   Procedure: HERNIA REPAIR INGUINAL ADULT WITH MESH;  Surgeon: Virl Cagey, MD;  Location: [facility] ORS;  Service: General;  Laterality: Left;  . JOINT REPLACEMENT Bilateral    knees  . POLYPECTOMY  08/14/2014   Procedure: POLYPECTOMY;  Surgeon: Danie Binder, MD;  Location: [facility];  Service: Endoscopy;;  . POLYPECTOMY N/A 11/06/2014   Procedure: POLYPECTOMY;  Surgeon: Danie Binder, MD;  Location: [facility];  Service: Endoscopy;  Laterality: N/A;  Transverse Colon x3, Ascending Colon x2, Descending Colon x3  . REPLACEMENT TOTAL KNEE BILATERAL    . UPPER GASTROINTESTINAL ENDOSCOPY  APR 2009   INFLAMED HYPERPLASTIC POLYPS, CHRONIC GASTRITIS        Home Medications    Prior to Admission medications   Medication Sig Start Date End Date Taking? Authorizing Provider  amLODipine (NORVASC) 5 MG tablet Take 1 tablet (5 mg total) by mouth daily. 09/24/18  Yes Emokpae, Courage, MD  atorvastatin (LIPITOR) 20 MG tablet Take 1 tablet (20 mg total) by mouth daily. 10/23/16  Yes Raylene Everts, MD  clindamycin (CLINDAGEL) 1 % gel Apply 1 application topically daily. Applies to back of neck   Yes [provider]  colchicine 0.6 MG tablet TAKE  1 TABLET BY MOUTH 3 TIMES DAILY X5 DAYS FOR GOUT PAIN. Patient taking differently: Take 0.6 mg by mouth See admin instructions. Take one tablet by mouth three times daily for 5 days as needed for gout pain 12/26/18  Yes Sanjuana Kava, MD  HYDROcodone-acetaminophen (NORCO/VICODIN) 5-325 MG tablet TAKE (1) TABLET BY MOUTH EVERY 6 HOURS AS NEEDED FOR ACUTE PAIN. MUSTLAST 14 DAYS. Patient taking differently: Take 1 tablet by mouth every 6 (six) hours as needed for moderate pain.  02/23/19  Yes Sanjuana Kava, MD  iron polysaccharides (NIFEREX) 150 MG capsule Take 1 capsule (150 mg total) by mouth daily. 01/20/19  Yes Derek Jack, MD  levothyroxine (SYNTHROID, LEVOTHROID) 200 MCG tablet Take 1 tablet (200 mcg total) by mouth daily before breakfast. 09/24/18  Yes Emokpae, Courage, MD  metoprolol succinate (TOPROL-XL) 25 MG 24 hr tablet TAKE 1 & 1/2 TABLETS BY MOUTH DAILY. Patient taking differently: Take 37.5 mg by mouth daily.  02/10/19  Yes Branch, Alphonse Guild, MD  omeprazole (PRILOSEC) 20 MG capsule TAKE 1 CAPSULE BY MOUTH ONCE DAILY. Patient taking differently: Take 20 mg by mouth daily.  11/19/17  Yes Carlis Stable, NP  tamsulosin (FLOMAX) 0.4 MG CAPS capsule Take 1 capsule (0.4 mg total) by mouth daily after supper. Patient taking differently: Take 0.4 mg by mouth daily.  09/24/18  Yes Emokpae, Courage, MD  ULORIC 40 MG tablet TAKE 1 TABLET BY MOUTH ONCE A DAY. Patient taking differently: Take 40 mg by mouth daily.  12/21/17  Yes Raylene Everts, MD  cephALEXin (KEFLEX) 500 MG capsule Take 1 capsule (500 mg total) by mouth 4 (four) times daily for 7 days. 03/07/19 03/14/19  Derrico Zhong, PA-C  famotidine (PEPCID) 20 MG tablet Take 1 tablet (20 mg total) by mouth daily for 7 days. 03/07/19 03/14/19  Chasey Dull, PA-C    Family History Family History  Problem Relation Age of Onset  . Diabetes Mother   . Renal Disease Mother        failure  . Diabetes Father        'old age'  .  Hypertension Sister   . Hypertension Brother   . Diabetes Brother   . Diabetes Sister   . Diabetes Sister   . Diabetes Brother   . Hypertension Brother   . Diabetes Brother        right BKA  . Kidney failure Brother        on dialysis  . Kidney failure Brother        on dialysis  . Diabetes Brother        bilateral BKA  . Colon polyps Neg Hx   . Colon cancer Neg Hx   . Pancreatic disease Neg Hx     Social History Social History   Tobacco Use  . Smoking status: Former Smoker    Years: 0.00  . Smokeless tobacco: Never Used  . Tobacco comment: Quit x 10 years/ never smoked on regular basis  Substance Use Topics  . Alcohol use: Yes    Alcohol/week: 2.0 standard drinks    Types: 2 Shots of liquor per week    Comment: drinks on weekends, gin/vodka 1/5th.  . Drug use: No     Allergies   Aspirin   Review of Systems Review of Systems  Constitutional: Positive for appetite change.  Neurological: Positive for weakness.  All other systems reviewed and are negative.    Physical Exam Updated Vital Signs BP (!) 163/104   Pulse 87   Temp 98.7 F (37.1 C) (Oral)   Resp 16   Ht 6\' 1"  (1.854 m)   Wt 101 kg   SpO2 98%   BMI 29.38 kg/m   Physical Exam Vitals signs and nursing note reviewed.  Constitutional:      General: He is not in acute distress.    Appearance: He is well-developed.     Comments: Appears nontoxic  HENT:     Head: Normocephalic and atraumatic.  Eyes:     Extraocular Movements: Extraocular movements intact.     Conjunctiva/sclera: Conjunctivae normal.     Pupils: Pupils are equal, round, and reactive to light.  Neck:     Musculoskeletal: Normal range of motion and neck supple.  Cardiovascular:     Rate and Rhythm: Normal rate and regular rhythm.     Pulses: Normal pulses.  Pulmonary:     Effort: Pulmonary effort is normal. No respiratory distress.     Breath sounds: Normal breath sounds.  No wheezing.  Abdominal:     General: There is no  distension.     Palpations: Abdomen is soft. There is no mass.     Tenderness: There is no abdominal tenderness. There is no guarding or rebound.     Comments: No ttp of the abd. Soft without rigidity, guarding, distention. Negative rebound  Musculoskeletal: Normal range of motion.  Skin:    General: Skin is warm and dry.     Capillary Refill: Capillary refill takes less than 2 seconds.  Neurological:     Mental Status: He is alert and oriented to person, place, and time.      ED Treatments / Results  Labs (all labs ordered are listed, but only abnormal results are displayed) Labs Reviewed  BASIC METABOLIC PANEL - Abnormal; Notable for the following components:      Result Value   Glucose, Bld 102 (*)    Anion gap 17 (*)    All other components within normal limits  CBC - Abnormal; Notable for the following components:   Platelets 119 (*)    All other components within normal limits  URINALYSIS, ROUTINE W REFLEX MICROSCOPIC - Abnormal; Notable for the following components:   Color, Urine AMBER (*)    APPearance HAZY (*)    Hgb urine dipstick SMALL (*)    Ketones, ur 20 (*)    Protein, ur 100 (*)    Leukocytes,Ua MODERATE (*)    WBC, UA >50 (*)    Bacteria, UA RARE (*)    All other components within normal limits  URINE CULTURE  MAGNESIUM  PHOSPHORUS    EKG None  Radiology Ct Abdomen Pelvis W Contrast  Result Date: 03/07/2019 CLINICAL DATA:  History of carcinoid tumor of the stomach. Weakness and loss of appetite with weight loss. EXAM: CT ABDOMEN AND PELVIS WITH CONTRAST TECHNIQUE: Multidetector CT imaging of the abdomen and pelvis was performed using the standard protocol following bolus administration of intravenous contrast. CONTRAST:  176mL OMNIPAQUE IOHEXOL 300 MG/ML  SOLN COMPARISON:  Nuclear medicine study 10/18/2018.  CT 09/11/2014 FINDINGS: Lower chest: Mild basilar pulmonary scarring. Small Bochdalek's hernia on the left. Subcentimeter patchy density at the  lateral left base axial image 18, likely to be benign scarring. Hepatobiliary: Normal. Mild fatty change adjacent to the false form ligament. Previous cholecystectomy. Pancreas: Normal Spleen: Normal Adrenals/Urinary Tract: Adrenal glands are normal. Kidneys are normal. Bladder is normal. Stomach/Bowel: No gastric mass identified. No focal small bowel lesion. The colon shows widespread diverticulosis without evidence of diverticulitis. Vascular/Lymphatic: Aortic atherosclerosis. No aneurysm. IVC is normal. No retroperitoneal or mesenteric adenopathy. No evidence of mesenteric mass. Reproductive: Normal Other: Presumed sebaceous cyst within the anterior abdominal wall just to the left of midline at the umbilical level. Small inguinal hernia on the left containing a small amount of fluid. Musculoskeletal: Ordinary lumbar degenerative changes. IMPRESSION: No acute finding. No cause of weight loss identified. No evidence of residual or recurrent tumor in the abdomen. Diverticulosis without CT evidence of diverticulitis. Aortic atherosclerosis. Small amount of fluid in a small left inguinal hernia. Small patchy density at the lateral left base, likely scarring Electronically Signed   By: Nelson Chimes M.D.   On: 03/07/2019 15:40    Procedures Procedures (including critical care time)  Medications Ordered in ED Medications  famotidine (PEPCID) IVPB 20 mg premix (0 mg Intravenous Stopped 03/07/19 1440)  sodium chloride 0.9 % bolus 1,000 mL (0 mLs Intravenous Stopped 03/07/19 1450)  iohexol (OMNIPAQUE) 300 MG/ML  solution 100 mL (100 mLs Intravenous Contrast Given 03/07/19 1510)  sodium chloride 0.9 % bolus 1,000 mL (0 mLs Intravenous Stopped 03/07/19 1741)     Initial Impression / Assessment and Plan / ED Course  I have reviewed the triage vital signs and the nursing notes.  Pertinent labs & imaging results that were available during my care of the patient were reviewed by me and considered in my medical decision  making (see chart for details).        Patient presenting for evaluation of anorexia and generalized weakness.  Physical exam this patient who appears nontoxic.  He is tachycardic on arrival, but improved at rest.  With orthostatics, tachycardia returns.  Patient reports his weakness is worse when he is standing, likely related to dehydration.  Unknown cause for patient's anorexia, but no obvious weight loss.  Patient was seen 2 days ago for the same, had reassuring labs at that time.  Will repeat labs to ensure no electrolyte or kidney abnormality.  Will order CT for further evaluation.  1L fluid bolus for dehydration.  Pepcid for anorexia.  Labs reassuring, no leukocytosis.  Kidney function and electrolytes stable.  Slight gap at 17, likely due to dehydration.  Urine positive for infection, likely contributing to his dehydration and possibly the cause of his anorexia.  On reassessment, patient reports he is feeling better.  Patient ambulated, and heart rate increased, although not as severe as before.  Heart rate went up to 110.  Will give a second bolus.  Reports his anorexia is improved, tolerating p.o. without difficulty.  CT without acute findings.  Discussed findings with patient.  Discussed treatment with Pepcid and antibiotics for UTI.  Encouraged follow-up with primary care and GI as needed.  At this time, patient appears safe for discharge.  Return precautions given.  Patient states he understands and agrees to plan.   Final Clinical Impressions(s) / ED Diagnoses   Final diagnoses:  Weakness  Dehydration  Acute cystitis without hematuria    ED Discharge Orders         Ordered    famotidine (PEPCID) 20 MG tablet  Daily     03/07/19 1622    cephALEXin (KEFLEX) 500 MG capsule  4 times daily     03/07/19 1625           Kashish Yglesias, PA-C 03/07/19 2305    Nat Christen, MD 03/08/19 1657

## 2019-03-07 NOTE — ED Triage Notes (Signed)
Weakness and decreased appetite for the past few days.

## 2019-03-07 NOTE — Discharge Instructions (Addendum)
Take antbiotics as prescribed. Take the entire course, even if your symptoms improve.  It is very important that you are staying well hydrated with water. Your urine should be clear to pale yellow. Take your home medications daily. I also recommend you take Pepcid daily for your appetite.  Follow up with your primary care doctor in 1 week for recheck of your symptoms.  Follow up with your Gi doctor as needed for further evaluation/management of your carcinoid tumor.  Return to the ER if you develop weakness, dizziness, fevers, or any new, worsening, or concerning symptoms.

## 2019-03-08 LAB — URINE CULTURE: Culture: >=100000 — AB

## 2019-03-09 ENCOUNTER — Other Ambulatory Visit: Payer: Self-pay | Admitting: Cardiology

## 2019-03-09 ENCOUNTER — Telehealth: Payer: Self-pay

## 2019-03-09 NOTE — Telephone Encounter (Signed)
Post ED Visit - Positive Culture Follow-up  Culture report reviewed by antimicrobial stewardship pharmacist: [place] Team []  Elenor Quinones, Pharm.D. []  Heide Guile, Pharm.D., BCPS AQ-ID []  Parks Neptune, Pharm.D., BCPS []  Alycia Rossetti, Pharm.D., BCPS []  [place], Pharm.D., BCPS, AAHIVP []  Legrand Como, Pharm.D., BCPS, AAHIVP []  Salome Arnt, PharmD, BCPS []  Johnnette Gourd, PharmD, BCPS [x]  Hughes Better, PharmD, BCPS []  Leeroy Cha, PharmD []  Laqueta Linden, PharmD, BCPS []  Albertina Parr, PharmD  [place] Team []  Leodis Sias, PharmD []  Lindell Spar, PharmD []  Royetta Asal, PharmD []  Graylin Shiver, Rph []  Rema Fendt) Glennon Mac, PharmD []  Arlyn Dunning, PharmD []  Netta Cedars, PharmD []  Dia Sitter, PharmD []  Leone Haven, PharmD []  Gretta Arab, PharmD []  Theodis Shove, PharmD []  Peggyann Juba, PharmD []  Reuel Boom, PharmD   Positive urine culture Treated with Cephalexin, organism sensitive to the same and no further patient follow-up is required at this time.  Genia Del 03/09/2019, 10:22 AM

## 2019-03-10 LAB — URINE CULTURE: Culture: >=100000 — AB

## 2019-03-11 ENCOUNTER — Telehealth: Payer: Self-pay | Admitting: Emergency Medicine

## 2019-03-11 ENCOUNTER — Telehealth (HOSPITAL_COMMUNITY): Payer: Self-pay | Admitting: Pharmacist

## 2019-03-11 NOTE — Progress Notes (Signed)
ED Antimicrobial Stewardship Positive Culture Follow Up   [name] is an 68 y.o. male who presented to [facility] on (Not on file) with a chief complaint of No chief complaint on file.   Recent Results (from the past 720 hour(s))  Urine Culture     Status: Abnormal   Collection Time: 03/05/19  5:24 PM   Specimen: Urine, Clean Catch  Result Value Ref Range Status   Specimen Description   Final    URINE, CLEAN CATCH Performed at [facility], [address], [place], [place] [address]    Special Requests   Final    NONE Performed at [facility], [address], [place], [place] [address]    Culture >=100,000 COLONIES/mL ESCHERICHIA COLI (A)  Final   Report Status 03/08/2019 FINAL  Final   Organism ID, Bacteria ESCHERICHIA COLI (A)  Final      Susceptibility   Escherichia coli - MIC*    AMPICILLIN >=32 RESISTANT Resistant     CEFAZOLIN <=4 SENSITIVE Sensitive     CEFTRIAXONE <=1 SENSITIVE Sensitive     CIPROFLOXACIN >=4 RESISTANT Resistant     GENTAMICIN <=1 SENSITIVE Sensitive     IMIPENEM <=0.25 SENSITIVE Sensitive     NITROFURANTOIN <=16 SENSITIVE Sensitive     TRIMETH/SULFA <=20 SENSITIVE Sensitive     AMPICILLIN/SULBACTAM >=32 RESISTANT Resistant     Extended ESBL NEGATIVE Sensitive     * >=100,000 COLONIES/mL ESCHERICHIA COLI  Urine culture     Status: Abnormal   Collection Time: 03/07/19 11:22 AM   Specimen: Urine, Clean Catch  Result Value Ref Range Status   Specimen Description   Final    URINE, CLEAN CATCH Performed at [facility], [address], [place], [place] [address]    Special Requests   Final    NONE Performed at [facility], [address], [place], [place] [address]    Culture (A)  Final    >=100,000 COLONIES/mL ESCHERICHIA COLI Confirmed Extended Spectrum Beta-Lactamase Producer (ESBL).  In bloodstream infections from ESBL organisms, carbapenems are preferred over piperacillin/tazobactam. They are shown to have a lower risk of mortality.    Report Status 03/10/2019 FINAL  Final   Organism ID, Bacteria ESCHERICHIA COLI (A)  Final      Susceptibility   Escherichia coli - MIC*    AMPICILLIN >=32 RESISTANT Resistant     CEFAZOLIN RESISTANT Resistant     CEFTRIAXONE RESISTANT Resistant     CIPROFLOXACIN >=4 RESISTANT Resistant     GENTAMICIN <=1 SENSITIVE Sensitive     IMIPENEM <=0.25 SENSITIVE Sensitive     NITROFURANTOIN 32 SENSITIVE Sensitive     TRIMETH/SULFA <=20 SENSITIVE Sensitive     AMPICILLIN/SULBACTAM >=32 RESISTANT Resistant     Extended ESBL POSITIVE Resistant     * >=100,000 COLONIES/mL ESCHERICHIA COLI    [x]  Treated with Cephalexin, organism resistant to prescribed antimicrobial []  Patient discharged originally without antimicrobial agent and treatment is now indicated  New antibiotic prescription: Macrobid 100 mg po BID x 5 days *Stop Cephalexin  ED Provider: S. Petrucelli, PA-C   [name] 03/11/2019, 9:15 AM Clinical Pharmacist Monday - Friday phone -  [phone] Saturday - Sunday phone - [phone]

## 2019-03-11 NOTE — Telephone Encounter (Signed)
Post ED Visit - Positive Culture Follow-up: Successful Patient Follow-Up  Culture assessed and recommendations reviewed by:  []  Elenor Quinones, Pharm.D. []  Heide Guile, Pharm.D., BCPS AQ-ID []  Parks Neptune, Pharm.D., BCPS []  Alycia Rossetti, Pharm.D., BCPS []  [place], [place].D., BCPS, AAHIVP []  Legrand Como, Pharm.D., BCPS, AAHIVP []  Salome Arnt, PharmD, BCPS []  Johnnette Gourd, PharmD, BCPS [x]  Hughes Better, PharmD, BCPS   Positive urine culture  []  Patient discharged without antimicrobial prescription and treatment is now indicated [x]  Organism is resistant to prescribed ED discharge antimicrobial []  Patient with positive blood cultures  Changes discussed with ED provider: Kennith Maes PA New antibiotic prescription: Macrobid 100 mg BID x five days Called to [facility]  Contacted patient, date 03/11/2019, time [place] 03/11/2019, 2:27 PM

## 2019-03-14 ENCOUNTER — Telehealth: Payer: Self-pay | Admitting: Orthopaedic Surgery

## 2019-03-14 ENCOUNTER — Other Ambulatory Visit: Payer: Self-pay | Admitting: Orthopaedic Surgery

## 2019-03-14 MED ORDER — HYDROCODONE-ACETAMINOPHEN 5-325 MG PO TABS: ORAL_TABLET | ORAL | 0 refills | Status: DC

## 2019-03-14 NOTE — Telephone Encounter (Signed)
Patient requests refill on Hydrocodone/Acetaminophen 5-325  Mgs.  Qty 30  Sig: TAKE (1) TABLET BY MOUTH EVERY 6 HOURS AS NEEDED FOR ACUTE PAIN. MUSTLAST 14 DAYS.  Patient states he uses [facility]

## 2019-03-16 DIAGNOSIS — I10 Essential (primary) hypertension: Secondary | ICD-10-CM | POA: Diagnosis not present

## 2019-03-16 DIAGNOSIS — E785 Hyperlipidemia, unspecified: Secondary | ICD-10-CM | POA: Diagnosis not present

## 2019-03-16 DIAGNOSIS — E039 Hypothyroidism, unspecified: Secondary | ICD-10-CM | POA: Diagnosis not present

## 2019-03-16 DIAGNOSIS — D509 Iron deficiency anemia, unspecified: Secondary | ICD-10-CM | POA: Diagnosis not present

## 2019-03-16 DIAGNOSIS — D519 Vitamin B12 deficiency anemia, unspecified: Secondary | ICD-10-CM | POA: Diagnosis not present

## 2019-03-20 ENCOUNTER — Ambulatory Visit: Payer: Medicare Other | Admitting: Nurse Practitioner

## 2019-03-21 ENCOUNTER — Other Ambulatory Visit (HOSPITAL_COMMUNITY): Payer: Medicare Other

## 2019-03-28 ENCOUNTER — Ambulatory Visit (HOSPITAL_COMMUNITY): Payer: Medicare Other | Admitting: Hematology

## 2019-04-11 ENCOUNTER — Ambulatory Visit (INDEPENDENT_AMBULATORY_CARE_PROVIDER_SITE_OTHER): Payer: Medicare Other | Admitting: Orthopaedic Surgery

## 2019-04-11 ENCOUNTER — Encounter: Payer: Self-pay | Admitting: Orthopaedic Surgery

## 2019-04-11 ENCOUNTER — Other Ambulatory Visit: Payer: Self-pay

## 2019-04-11 VITALS — BP 131/86 | HR 86 | Temp 97.2°F | Ht 72.5 in | Wt 230.0 lb

## 2019-04-11 DIAGNOSIS — G894 Chronic pain syndrome: Secondary | ICD-10-CM

## 2019-04-11 DIAGNOSIS — M1A0410 Idiopathic chronic gout, right hand, without tophus (tophi): Secondary | ICD-10-CM | POA: Diagnosis not present

## 2019-04-11 MED ORDER — HYDROCODONE-ACETAMINOPHEN 5-325 MG PO TABS: ORAL_TABLET | ORAL | 0 refills | Status: DC

## 2019-04-11 NOTE — Progress Notes (Signed)
Patient [name], male DOB:Jan 29, 1951, 68 y.o. ATF:[id number]  Chief Complaint  Patient presents with  . Hand Pain    R/doing good today    HPI  [name] is a 68 y.o. male who has chronic gout.  He has been taking his medicine  He needs refill on pain medicine.  He has no new episodes of the gout.  He has no trauma.   Body mass index is 30.76 kg/m.  ROS  Review of Systems  HENT: Negative for congestion.   Respiratory: Negative for cough and shortness of breath.   Cardiovascular: Negative for chest pain and leg swelling.  Endocrine: Positive for cold intolerance.  Musculoskeletal: Positive for arthralgias and joint swelling.  Allergic/Immunologic: Positive for environmental allergies.  All other systems reviewed and are negative.   All other systems reviewed and are negative.  The following is a summary of the past history medically, past history surgically, known current medicines, social history and family history.  This information is gathered electronically by the computer from prior information and documentation.  I review this each visit and have found including this information at this point in the chart is beneficial and informative.    Past Medical History:  Diagnosis Date  . Anemia    FeDA: GASTRIC POLYPS, B12; TCS 2008 EGD 2009, 2008-HB 11.1 MCV 83.6 CR 1.22, 2009 FERRITIN 102-22  . B12 deficiency   . Carcinoid tumor determined by biopsy of stomach 09/25/2014  . Chronic knee pain   . Diverticulosis of colon    Lower GI bleed 2008  . Essential hypertension   . GERD (gastroesophageal reflux disease)   . Gout   . Hepatomegaly    Hepatic steatosis  . History of alcohol abuse   . History of septic arthritis   . Hypothyroidism   . PUD   . Substance abuse [facility])     Past Surgical History:  Procedure Laterality Date  . BIOPSY  08/14/2014   Procedure: BIOPSY;  Surgeon: Danie Binder, MD;  Location: [facility];  Service: Endoscopy;;  . BIOPSY  03/12/2015   Procedure: BIOPSY;  Surgeon: Danie Binder, MD;  Location: [facility];  Service: Endoscopy;;  . BIOPSY  04/21/2016   Procedure: BIOPSY;  Surgeon: Danie Binder, MD;  [facility];  Service: Endoscopy;;  bx's of antrum, body of stomach, fundus, and cardia  . CHOLECYSTECTOMY    . COLONOSCOPY  2008 [facility] DJ   LGIB 2o to TICS, prep good  . COLONOSCOPY WITH PROPOFOL N/A 08/14/2014   SLF: 1. Four large colon polyps removed. 2. The left colon is redundant 3. Moderate diverticulosis throughout teh entire examined colon  . COLONOSCOPY WITH PROPOFOL N/A 11/06/2014   SLF: 8 small polyps removed. One large pedunculated polyp removed from the ascending colon, tubulovillous and tubular adenomas. Next colonoscopy March 2019  . COLONOSCOPY WITH PROPOFOL N/A 01/04/2018   Procedure: COLONOSCOPY WITH PROPOFOL;  Surgeon: Danie Binder, MD;  [facility];  Service: Endoscopy;  Laterality: N/A;  10:00am  . ESOPHAGOGASTRODUODENOSCOPY  12/08/2007   YHC:WCBJ gastric polyps seen in the cardia and body of the stomach/Normal esophagus without evidence of Barrett's, mass, erosion/Normal duodenal bulb and second portion of the duodenum. Benign bx.  . ESOPHAGOGASTRODUODENOSCOPY (EGD) WITH PROPOFOL N/A 08/14/2014   SLF: 1. Heme postive stools due to gastric and colon polyps 2. Multiple gastric   . ESOPHAGOGASTRODUODENOSCOPY (EGD) WITH PROPOFOL N/A 03/12/2015   SLF: Multiple gastric nodules seen in gasric body/antrum. 2.  Non-erosive gastritis ( inflammation) was found in the gastric antrum.   . ESOPHAGOGASTRODUODENOSCOPY (EGD) WITH PROPOFOL N/A 10/08/2015   Procedure: ESOPHAGOGASTRODUODENOSCOPY (EGD) WITH PROPOFOL;  Surgeon: Danie Binder, MD;  [facility];  Service: Endoscopy;  Laterality: N/A;  0945  . ESOPHAGOGASTRODUODENOSCOPY (EGD) WITH PROPOFOL N/A 04/21/2016   Procedure: ESOPHAGOGASTRODUODENOSCOPY (EGD) WITH PROPOFOL;  Surgeon: Danie Binder, MD;  [facility];  Service: Endoscopy;   Laterality: N/A;  730   . HAND SURGERY Right    fracture repair with plates  . INGUINAL HERNIA REPAIR Left 11/09/2018   Procedure: HERNIA REPAIR INGUINAL ADULT WITH MESH;  Surgeon: Virl Cagey, MD;  Location: [facility];  Service: General;  Laterality: Left;  . JOINT REPLACEMENT Bilateral    knees  . POLYPECTOMY  08/14/2014   Procedure: POLYPECTOMY;  Surgeon: Danie Binder, MD;  Location: [facility];  Service: Endoscopy;;  . POLYPECTOMY N/A 11/06/2014   Procedure: POLYPECTOMY;  Surgeon: Danie Binder, MD;  [facility];  Service: Endoscopy;  Laterality: N/A;  Transverse Colon x3, Ascending Colon x2, Descending Colon x3  . REPLACEMENT TOTAL KNEE BILATERAL    . UPPER GASTROINTESTINAL ENDOSCOPY  APR 2009   INFLAMED HYPERPLASTIC POLYPS, CHRONIC GASTRITIS    Family History  Problem Relation Age of Onset  . Diabetes Mother   . Renal Disease Mother        failure  . Diabetes Father        'old age'  . Hypertension Sister   . Hypertension Brother   . Diabetes Brother   . Diabetes Sister   . Diabetes Sister   . Diabetes Brother   . Hypertension Brother   . Diabetes Brother        right BKA  . Kidney failure Brother        on dialysis  . Kidney failure Brother        on dialysis  . Diabetes Brother        bilateral BKA  . Colon polyps Neg Hx   . Colon cancer Neg Hx   . Pancreatic disease Neg Hx     Social History Social History   Tobacco Use  . Smoking status: Former Smoker    Years: 0.00  . Smokeless tobacco: Never Used  . Tobacco comment: Quit x 10 years/ never smoked on regular basis  Substance Use Topics  . Alcohol use: Yes    Alcohol/week: 2.0 standard drinks    Types: 2 Shots of liquor per week    Comment: drinks on weekends, gin/vodka 1/5th.  . Drug use: No    Allergies  Allergen Reactions  . Aspirin Other (See Comments)    REACTION: Diverticular Bleed    Current Outpatient Medications  Medication Sig Dispense Refill  . amLODipine (NORVASC) 5 MG  tablet TAKE 1 TABLET BY MOUTH ONCE DAILY. 90 tablet 3  . atorvastatin (LIPITOR) 20 MG tablet Take 1 tablet (20 mg total) by mouth daily. 90 tablet 3  . clindamycin (CLINDAGEL) 1 % gel Apply 1 application topically daily. Applies to back of neck    . colchicine 0.6 MG tablet TAKE 1 TABLET BY MOUTH 3 TIMES DAILY X5 DAYS FOR GOUT PAIN. 15 tablet 2  . famotidine (PEPCID) 20 MG tablet Take 1 tablet (20 mg total) by mouth daily for 7 days. 7 tablet 0  . HYDROcodone-acetaminophen (NORCO/VICODIN) 5-325 MG tablet One tablet by mouth every six hours as needed for pain.  Must last 30 days.  56 tablet 0  . iron polysaccharides (NIFEREX) 150 MG capsule Take 1 capsule (150 mg total) by mouth daily. 90 capsule 1  . levothyroxine (SYNTHROID, LEVOTHROID) 200 MCG tablet Take 1 tablet (200 mcg total) by mouth daily before breakfast. 90 tablet 3  . metoprolol succinate (TOPROL-XL) 25 MG 24 hr tablet TAKE 1 & 1/2 TABLETS BY MOUTH DAILY. (Patient taking differently: Take 37.5 mg by mouth daily. ) 45 tablet 9  . omeprazole (PRILOSEC) 20 MG capsule TAKE 1 CAPSULE BY MOUTH ONCE DAILY. (Patient taking differently: Take 20 mg by mouth daily. ) 30 capsule 3  . tamsulosin (FLOMAX) 0.4 MG CAPS capsule Take 1 capsule (0.4 mg total) by mouth daily after supper. (Patient taking differently: Take 0.4 mg by mouth daily. ) 30 capsule 3  . ULORIC 40 MG tablet TAKE 1 TABLET BY MOUTH ONCE A DAY. (Patient taking differently: Take 40 mg by mouth daily. ) 90 tablet 0   No current facility-administered medications for this visit.      Physical Exam  Blood pressure 131/86, pulse 86, temperature (!) 97.2 F (36.2 C), height 6' 0.5" (1.842 m), weight 230 lb (104.3 kg).  Constitutional: overall normal hygiene, normal nutrition, well developed, normal grooming, normal body habitus. Assistive device:cane  Musculoskeletal: gait and station Limp right, muscle tone and strength are normal, no tremors or atrophy is present.  .  Neurological:  coordination overall normal.  Deep tendon reflex/nerve stretch intact.  Sensation normal.  Cranial nerves II-XII intact.   Skin:   Normal overall no scars, lesions, ulcers or rashes. No psoriasis.  Psychiatric: Alert and oriented x 3.  Recent memory intact, remote memory unclear.  Normal mood and affect. Well groomed.  Good eye contact.  Cardiovascular: overall no swelling, no varicosities, no edema bilaterally, normal temperatures of the legs and arms, no clubbing, cyanosis and good capillary refill.  Lymphatic: palpation is normal.  All other systems reviewed and are negative   The patient has been educated about the nature of the problem(s) and counseled on treatment options.  The patient appeared to understand what I have discussed and is in agreement with it.  Encounter Diagnoses  Name Primary?  . 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 Chronic pain disorder     PLAN Call if any problems.  Precautions discussed.  Continue current medications.   Return to clinic 3 months   I have reviewed the [place] web site prior to prescribing narcotic medicine for this patient.   Electronically Signed Sanjuana Kava, MD 8/11/20209:42 AM

## 2019-04-17 ENCOUNTER — Ambulatory Visit (HOSPITAL_COMMUNITY): Payer: Medicare Other

## 2019-04-17 ENCOUNTER — Other Ambulatory Visit (HOSPITAL_COMMUNITY): Payer: Medicare Other

## 2019-04-18 DIAGNOSIS — R21 Rash and other nonspecific skin eruption: Secondary | ICD-10-CM | POA: Diagnosis not present

## 2019-04-24 ENCOUNTER — Emergency Department (HOSPITAL_COMMUNITY): Payer: Medicare Other

## 2019-04-24 ENCOUNTER — Encounter (HOSPITAL_COMMUNITY): Payer: Self-pay | Admitting: Emergency Medicine

## 2019-04-24 ENCOUNTER — Inpatient Hospital Stay (HOSPITAL_COMMUNITY): Payer: Medicare Other | Attending: Hematology

## 2019-04-24 ENCOUNTER — Ambulatory Visit (HOSPITAL_COMMUNITY): Payer: Medicare Other | Admitting: Hematology

## 2019-04-24 ENCOUNTER — Emergency Department (HOSPITAL_COMMUNITY)
Admission: EM | Admit: 2019-04-24 | Discharge: 2019-04-24 | Disposition: A | Discharge status: Home / Self Care | Payer: Medicare Other | Source: Home / Self Care | Attending: Emergency Medicine | Admitting: Emergency Medicine

## 2019-04-24 ENCOUNTER — Other Ambulatory Visit: Payer: Self-pay

## 2019-04-24 DIAGNOSIS — D500 Iron deficiency anemia secondary to blood loss (chronic): Secondary | ICD-10-CM

## 2019-04-24 DIAGNOSIS — D3A8 Other benign neuroendocrine tumors: Secondary | ICD-10-CM

## 2019-04-24 DIAGNOSIS — C7A00 Malignant carcinoid tumor of unspecified site: Secondary | ICD-10-CM | POA: Diagnosis not present

## 2019-04-24 DIAGNOSIS — Z79899 Other long term (current) drug therapy: Secondary | ICD-10-CM | POA: Insufficient documentation

## 2019-04-24 DIAGNOSIS — Z20828 Contact with and (suspected) exposure to other viral communicable diseases: Secondary | ICD-10-CM | POA: Insufficient documentation

## 2019-04-24 DIAGNOSIS — R1900 Intra-abdominal and pelvic swelling, mass and lump, unspecified site: Secondary | ICD-10-CM | POA: Diagnosis not present

## 2019-04-24 DIAGNOSIS — Z03818 Encounter for observation for suspected exposure to other biological agents ruled out: Secondary | ICD-10-CM | POA: Diagnosis not present

## 2019-04-24 DIAGNOSIS — Z1159 Encounter for screening for other viral diseases: Secondary | ICD-10-CM | POA: Insufficient documentation

## 2019-04-24 DIAGNOSIS — K5730 Diverticulosis of large intestine without perforation or abscess without bleeding: Secondary | ICD-10-CM | POA: Diagnosis not present

## 2019-04-24 DIAGNOSIS — R1031 Right lower quadrant pain: Secondary | ICD-10-CM | POA: Diagnosis not present

## 2019-04-24 DIAGNOSIS — E039 Hypothyroidism, unspecified: Secondary | ICD-10-CM | POA: Insufficient documentation

## 2019-04-24 DIAGNOSIS — L02211 Cutaneous abscess of abdominal wall: Secondary | ICD-10-CM

## 2019-04-24 DIAGNOSIS — R109 Unspecified abdominal pain: Secondary | ICD-10-CM | POA: Insufficient documentation

## 2019-04-24 DIAGNOSIS — I10 Essential (primary) hypertension: Secondary | ICD-10-CM | POA: Insufficient documentation

## 2019-04-24 DIAGNOSIS — Z87891 Personal history of nicotine dependence: Secondary | ICD-10-CM | POA: Insufficient documentation

## 2019-04-24 DIAGNOSIS — R1904 Left lower quadrant abdominal swelling, mass and lump: Secondary | ICD-10-CM | POA: Diagnosis not present

## 2019-04-24 DIAGNOSIS — R918 Other nonspecific abnormal finding of lung field: Secondary | ICD-10-CM

## 2019-04-24 DIAGNOSIS — R1032 Left lower quadrant pain: Secondary | ICD-10-CM | POA: Diagnosis not present

## 2019-04-24 LAB — COMPREHENSIVE METABOLIC PANEL
ALT: 16 U/L (ref 0–44)
ALT: 13 U/L (ref 0–44)
AST: 21 U/L (ref 15–41)
AST: 21 U/L (ref 15–41)
Albumin: 3.9 g/dL (ref 3.5–5.0)
Albumin: 3.7 g/dL (ref 3.5–5.0)
Alkaline Phosphatase: 73 U/L (ref 38–126)
Alkaline Phosphatase: 71 U/L (ref 38–126)
Anion gap: 13 (ref 5–15)
Anion gap: 10 (ref 5–15)
BUN: 20 mg/dL (ref 8–23)
BUN: 22 mg/dL (ref 8–23)
CO2: 25 mmol/L (ref 22–32)
CO2: 27 mmol/L (ref 22–32)
Calcium: 9.4 mg/dL (ref 8.9–10.3)
Calcium: 9.3 mg/dL (ref 8.9–10.3)
Chloride: 99 mmol/L (ref 98–111)
Chloride: 98 mmol/L (ref 98–111)
Creatinine, Ser: 1.36 mg/dL — ABNORMAL HIGH (ref 0.61–1.24)
Creatinine, Ser: 1.37 mg/dL — ABNORMAL HIGH (ref 0.61–1.24)
GFR calc Af Amer: >60 mL/min (ref >60)
GFR calc Af Amer: >60 mL/min (ref >60)
GFR calc non Af Amer: 52 mL/min — ABNORMAL LOW (ref >60)
GFR calc non Af Amer: 52 mL/min — ABNORMAL LOW (ref >60)
Glucose, Bld: 117 mg/dL — ABNORMAL HIGH (ref 70–99)
Glucose, Bld: 111 mg/dL — ABNORMAL HIGH (ref 70–99)
Potassium: 3.7 mmol/L (ref 3.5–5.1)
Potassium: 3.8 mmol/L (ref 3.5–5.1)
Sodium: 137 mmol/L (ref 135–145)
Sodium: 135 mmol/L (ref 135–145)
Total Bilirubin: 2.1 mg/dL — ABNORMAL HIGH (ref 0.3–1.2)
Total Bilirubin: 1.9 mg/dL — ABNORMAL HIGH (ref 0.3–1.2)
Total Protein: 8.5 g/dL — ABNORMAL HIGH (ref 6.5–8.1)
Total Protein: 8.4 g/dL — ABNORMAL HIGH (ref 6.5–8.1)

## 2019-04-24 LAB — CBC WITH DIFFERENTIAL/PLATELET
Abs Immature Granulocytes: 0.05 10*3/uL (ref 0.00–0.07)
Abs Immature Granulocytes: 0.05 10*3/uL (ref 0.00–0.07)
Basophils Absolute: 0 10*3/uL (ref 0.0–0.1)
Basophils Absolute: 0 10*3/uL (ref 0.0–0.1)
Basophils Relative: 0 %
Basophils Relative: 0 %
Eosinophils Absolute: 0.2 10*3/uL (ref 0.0–0.5)
Eosinophils Absolute: 0.1 10*3/uL (ref 0.0–0.5)
Eosinophils Relative: 1 %
Eosinophils Relative: 1 %
HCT: 41.6 % (ref 39.0–52.0)
HCT: 38 % — ABNORMAL LOW (ref 39.0–52.0)
Hemoglobin: 13.5 g/dL (ref 13.0–17.0)
Hemoglobin: 12.2 g/dL — ABNORMAL LOW (ref 13.0–17.0)
Immature Granulocytes: 0 %
Immature Granulocytes: 0 %
Lymphocytes Relative: 13 %
Lymphocytes Relative: 7 %
Lymphs Abs: 1.9 10*3/uL (ref 0.7–4.0)
Lymphs Abs: 1 10*3/uL (ref 0.7–4.0)
MCH: 31.4 pg (ref 26.0–34.0)
MCH: 31.1 pg (ref 26.0–34.0)
MCHC: 32.5 g/dL (ref 30.0–36.0)
MCHC: 32.1 g/dL (ref 30.0–36.0)
MCV: 96.7 fL (ref 80.0–100.0)
MCV: 96.9 fL (ref 80.0–100.0)
Monocytes Absolute: 1.5 10*3/uL — ABNORMAL HIGH (ref 0.1–1.0)
Monocytes Absolute: 1.5 10*3/uL — ABNORMAL HIGH (ref 0.1–1.0)
Monocytes Relative: 10 %
Monocytes Relative: 10 %
Neutro Abs: 11.2 10*3/uL — ABNORMAL HIGH (ref 1.7–7.7)
Neutro Abs: 12.5 10*3/uL — ABNORMAL HIGH (ref 1.7–7.7)
Neutrophils Relative %: 76 %
Neutrophils Relative %: 82 %
Platelets: 151 10*3/uL (ref 150–400)
Platelets: 151 10*3/uL (ref 150–400)
RBC: 4.3 MIL/uL (ref 4.22–5.81)
RBC: 3.92 MIL/uL — ABNORMAL LOW (ref 4.22–5.81)
RDW: 14 % (ref 11.5–15.5)
RDW: 14 % (ref 11.5–15.5)
WBC: 14.8 10*3/uL — ABNORMAL HIGH (ref 4.0–10.5)
WBC: 15.2 10*3/uL — ABNORMAL HIGH (ref 4.0–10.5)
nRBC: 0 % (ref 0.0–0.2)
nRBC: 0 % (ref 0.0–0.2)

## 2019-04-24 LAB — FERRITIN: Ferritin: 283 ng/mL (ref 24–336)

## 2019-04-24 LAB — LACTATE DEHYDROGENASE: LDH: 125 U/L (ref 98–192)

## 2019-04-24 LAB — SARS CORONAVIRUS 2 BY RT PCR (HOSPITAL ORDER, PERFORMED IN ~~LOC~~ HOSPITAL LAB): SARS Coronavirus 2: NEGATIVE

## 2019-04-24 MED ORDER — HYDROMORPHONE HCL 1 MG/ML IJ SOLN
0.5000 mg | Freq: Once | INTRAMUSCULAR | Status: AC
  Administered 2019-04-24: 15:00:00 0.5 mg via INTRAVENOUS
  Filled 2019-04-24: qty 1

## 2019-04-24 MED ORDER — DOXYCYCLINE HYCLATE 100 MG PO TABS
100.0000 mg | ORAL_TABLET | Freq: Once | ORAL | Status: AC
  Administered 2019-04-24: 100 mg via ORAL
  Filled 2019-04-24: qty 1

## 2019-04-24 MED ORDER — LIDOCAINE-EPINEPHRINE (PF) 2 %-1:200000 IJ SOLN
20.0000 mL | Freq: Once | INTRAMUSCULAR | Status: AC
  Administered 2019-04-24: 20 mL via INTRADERMAL

## 2019-04-24 MED ORDER — VANCOMYCIN HCL IN DEXTROSE 1-5 GM/200ML-% IV SOLN
1000.0000 mg | Freq: Once | INTRAVENOUS | Status: AC
  Administered 2019-04-24: 15:00:00 1000 mg via INTRAVENOUS
  Filled 2019-04-24: qty 200

## 2019-04-24 MED ORDER — DOXYCYCLINE HYCLATE 100 MG PO CAPS: 100.0000 mg | ORAL_CAPSULE | Freq: Two times a day (BID) | ORAL | 0 refills | Status: DC

## 2019-04-24 MED ORDER — IOHEXOL 300 MG/ML  SOLN
100.0000 mL | Freq: Once | INTRAMUSCULAR | Status: AC | PRN
  Administered 2019-04-24: 15:00:00 100 mL via INTRAVENOUS

## 2019-04-24 MED ORDER — ONDANSETRON HCL 4 MG/2ML IJ SOLN
4.0000 mg | Freq: Once | INTRAMUSCULAR | Status: AC
  Administered 2019-04-24: 4 mg via INTRAVENOUS
  Filled 2019-04-24: qty 2

## 2019-04-24 NOTE — ED Provider Notes (Signed)
PROCEDURE NOTE:    Incision and drainage of abdominal wall abscess.  Wound anesthetized with 2% Xylocaine with epinephrine x10 cc.  A 3 mm stab via 11 blade was made in the central part of the abscess.  Copious amount of purulent drainage was obtained.  Wound was explored with a hemostat to break up loculations.  1/4 inch iodoform gauze inserted into wound.  Sterile dressing.  Patient tolerated procedure well.   Nat Christen, MD 04/24/19 [phone]

## 2019-04-24 NOTE — Discharge Instructions (Addendum)
Recheck Wednesday.  Can shower.  If packing comes out, that is okay.  Tylenol for pain.  Prescription for antibiotic called into your pharmacy.

## 2019-04-24 NOTE — ED Provider Notes (Signed)
[facility] EMERGENCY DEPARTMENT Provider Note   CSN: [id number] Arrival date & time: 04/24/19  1215     History   Chief Complaint Chief Complaint  Patient presents with  . Abdominal Pain    HPI [name] is a 68 y.o. male.     Patient complains of swelling and pain to his abdomen for last 3 days.  He was seen in the office and sent over here  The history is provided by the patient and medical records. No language interpreter was used.  Abdominal Pain Pain location:  Generalized Pain quality: aching   Pain radiates to:  Does not radiate Pain severity:  Moderate Onset quality:  Sudden Timing:  Constant Progression:  Worsening Chronicity:  New Associated symptoms: no chest pain, no cough, no diarrhea, no fatigue and no hematuria     Past Medical History:  Diagnosis Date  . Anemia    FeDA: GASTRIC POLYPS, B12; TCS 2008 EGD 2009, 2008-HB 11.1 MCV 83.6 CR 1.22, 2009 FERRITIN 102-22  . B12 deficiency   . Carcinoid tumor determined by biopsy of stomach 09/25/2014  . Chronic knee pain   . Diverticulosis of colon    Lower GI bleed 2008  . Essential hypertension   . GERD (gastroesophageal reflux disease)   . Gout   . Hepatomegaly    Hepatic steatosis  . History of alcohol abuse   . History of septic arthritis   . Hypothyroidism   . PUD   . Substance abuse [facility])     Patient Active Problem List   Diagnosis Date Noted  . Colon polyps 12/28/2018  . UTI (urinary tract infection) 09/22/2018  . AKI (acute kidney injury) ([place]) 09/22/2018  . Left inguinal hernia 09/20/2018  . S/P total knee replacement, left 06/29/06 01/27/2018  . Hyperlipidemia 10/23/2016  . Mild intellectual disabilities (CODE) 09/29/2016  . Personal history of gout 09/22/2016  . Literacy level of illiterate 09/22/2016  . S/P TKR (total knee replacement), right 09/18/08 09/22/2016  . Carcinoid tumor determined by biopsy of stomach 04/10/2016  . Tubulovillous adenoma of colon 09/12/2015  .  Neuroendocrine tumor 09/25/2014  . Elevated LFTs 07/17/2014  . Iron deficiency anemia 07/29/2010  . Fatty liver 07/29/2010  . Alcohol abuse 11/30/2008  . Osteoarthrosis involving lower leg 08/20/2008  . Essential hypertension 10/20/2006  . B12 deficiency 08/15/2006  . GERD 08/15/2006  . IBS 08/15/2006  . BPH without obstruction/lower urinary tract symptoms 08/15/2006  . OSTEOARTHRITIS 08/15/2006  . DEGENERATION, DISC NOS 08/15/2006    Past Surgical History:  Procedure Laterality Date  . BIOPSY  08/14/2014   Procedure: BIOPSY;  Surgeon: Danie Binder, MD;  Location: [facility];  Service: Endoscopy;;  . BIOPSY  03/12/2015   Procedure: BIOPSY;  Surgeon: Danie Binder, MD;  Location: [facility];  Service: Endoscopy;;  . BIOPSY  04/21/2016   Procedure: BIOPSY;  Surgeon: Danie Binder, MD;  [facility];  Service: Endoscopy;;  bx's of antrum, body of stomach, fundus, and cardia  . CHOLECYSTECTOMY    . COLONOSCOPY  2008 [facility] DJ   LGIB 2o to TICS, prep good  . COLONOSCOPY WITH PROPOFOL N/A 08/14/2014   SLF: 1. Four large colon polyps removed. 2. The left colon is redundant 3. Moderate diverticulosis throughout teh entire examined colon  . COLONOSCOPY WITH PROPOFOL N/A 11/06/2014   SLF: 8 small polyps removed. One large pedunculated polyp removed from the ascending colon, tubulovillous and tubular adenomas. Next colonoscopy March 2019  . COLONOSCOPY  WITH PROPOFOL N/A 01/04/2018   Procedure: COLONOSCOPY WITH PROPOFOL;  Surgeon: Danie Binder, MD;  [facility];  Service: Endoscopy;  Laterality: N/A;  10:00am  . ESOPHAGOGASTRODUODENOSCOPY  12/08/2007   ZOX:WRUE gastric polyps seen in the cardia and body of the stomach/Normal esophagus without evidence of Barrett's, mass, erosion/Normal duodenal bulb and second portion of the duodenum. Benign bx.  . ESOPHAGOGASTRODUODENOSCOPY (EGD) WITH PROPOFOL N/A 08/14/2014   SLF: 1. Heme postive stools due to gastric and colon polyps 2.  Multiple gastric   . ESOPHAGOGASTRODUODENOSCOPY (EGD) WITH PROPOFOL N/A 03/12/2015   SLF: Multiple gastric nodules seen in gasric body/antrum. 2. Non-erosive gastritis ( inflammation) was found in the gastric antrum.   . ESOPHAGOGASTRODUODENOSCOPY (EGD) WITH PROPOFOL N/A 10/08/2015   Procedure: ESOPHAGOGASTRODUODENOSCOPY (EGD) WITH PROPOFOL;  Surgeon: Danie Binder, MD;  [facility];  Service: Endoscopy;  Laterality: N/A;  0945  . ESOPHAGOGASTRODUODENOSCOPY (EGD) WITH PROPOFOL N/A 04/21/2016   Procedure: ESOPHAGOGASTRODUODENOSCOPY (EGD) WITH PROPOFOL;  Surgeon: Danie Binder, MD;  [facility];  Service: Endoscopy;  Laterality: N/A;  730   . HAND SURGERY Right    fracture repair with plates  . INGUINAL HERNIA REPAIR Left 11/09/2018   Procedure: HERNIA REPAIR INGUINAL ADULT WITH MESH;  Surgeon: Virl Cagey, MD;  Location: [facility];  Service: General;  Laterality: Left;  . JOINT REPLACEMENT Bilateral    knees  . POLYPECTOMY  08/14/2014   Procedure: POLYPECTOMY;  Surgeon: Danie Binder, MD;  Location: [facility];  Service: Endoscopy;;  . POLYPECTOMY N/A 11/06/2014   Procedure: POLYPECTOMY;  Surgeon: Danie Binder, MD;  Location: [facility];  Service: Endoscopy;  Laterality: N/A;  Transverse Colon x3, Ascending Colon x2, Descending Colon x3  . REPLACEMENT TOTAL KNEE BILATERAL    . UPPER GASTROINTESTINAL ENDOSCOPY  APR 2009   INFLAMED HYPERPLASTIC POLYPS, CHRONIC GASTRITIS        Home Medications    Prior to Admission medications   Medication Sig Start Date End Date Taking? Authorizing Provider  amLODipine (NORVASC) 5 MG tablet TAKE 1 TABLET BY MOUTH ONCE DAILY. 03/09/19   [name], MD  atorvastatin (LIPITOR) 20 MG tablet Take 1 tablet (20 mg total) by mouth daily. 10/23/16   Raylene Everts, MD  clindamycin (CLINDAGEL) 1 % gel Apply 1 application topically daily. Applies to back of neck    [provider]  colchicine 0.6 MG tablet TAKE 1 TABLET BY  MOUTH 3 TIMES DAILY X5 DAYS FOR GOUT PAIN. 03/14/19   Sanjuana Kava, MD  famotidine (PEPCID) 20 MG tablet Take 1 tablet (20 mg total) by mouth daily for 7 days. 03/07/19 03/14/19  Caccavale, Sophia, PA-C  HYDROcodone-acetaminophen (NORCO/VICODIN) 5-325 MG tablet One tablet by mouth every six hours as needed for pain.  Must last 30 days. 04/11/19   Sanjuana Kava, MD  iron polysaccharides (NIFEREX) 150 MG capsule Take 1 capsule (150 mg total) by mouth daily. 01/20/19   Derek Jack, MD  levothyroxine (SYNTHROID, LEVOTHROID) 200 MCG tablet Take 1 tablet (200 mcg total) by mouth daily before breakfast. 09/24/18   Denton Brick, Courage, MD  metoprolol succinate (TOPROL-XL) 25 MG 24 hr tablet TAKE 1 & 1/2 TABLETS BY MOUTH DAILY. Patient taking differently: Take 37.5 mg by mouth daily.  02/10/19   [name], MD  omeprazole (PRILOSEC) 20 MG capsule TAKE 1 CAPSULE BY MOUTH ONCE DAILY. Patient taking differently: Take 20 mg by mouth daily.  11/19/17   Carlis Stable,  NP  tamsulosin (FLOMAX) 0.4 MG CAPS capsule Take 1 capsule (0.4 mg total) by mouth daily after supper. Patient taking differently: Take 0.4 mg by mouth daily.  09/24/18   Emokpae, Courage, MD  ULORIC 40 MG tablet TAKE 1 TABLET BY MOUTH ONCE A DAY. Patient taking differently: Take 40 mg by mouth daily.  12/21/17   Raylene Everts, MD    Family History Family History  Problem Relation Age of Onset  . Diabetes Mother   . Renal Disease Mother        failure  . Diabetes Father        'old age'  . Hypertension Sister   . Hypertension Brother   . Diabetes Brother   . Diabetes Sister   . Diabetes Sister   . Diabetes Brother   . Hypertension Brother   . Diabetes Brother        right BKA  . Kidney failure Brother        on dialysis  . Kidney failure Brother        on dialysis  . Diabetes Brother        bilateral BKA  . Colon polyps Neg Hx   . Colon cancer Neg Hx   . Pancreatic disease Neg Hx     Social History Social History    Tobacco Use  . Smoking status: Former Smoker    Years: 0.00  . Smokeless tobacco: Never Used  . Tobacco comment: Quit x 10 years/ never smoked on regular basis  Substance Use Topics  . Alcohol use: Yes    Alcohol/week: 2.0 standard drinks    Types: 2 Shots of liquor per week    Comment: drinks on weekends, gin/vodka 1/5th.  . Drug use: No     Allergies   Aspirin   Review of Systems Review of Systems  Constitutional: Negative for appetite change and fatigue.  HENT: Negative for congestion, ear discharge and sinus pressure.   Eyes: Negative for discharge.  Respiratory: Negative for cough.   Cardiovascular: Negative for chest pain.  Gastrointestinal: Positive for abdominal pain. Negative for diarrhea.  Genitourinary: Negative for frequency and hematuria.  Musculoskeletal: Negative for back pain.  Skin: Negative for rash.  Neurological: Negative for seizures and headaches.  Psychiatric/Behavioral: Negative for hallucinations.     Physical Exam Updated Vital Signs BP 127/90 (BP Location: Left Arm)   Pulse (!) 117   Temp 100.3 F (37.9 C) (Oral)   Resp 18   Ht 6\' 5"  (1.956 m)   Wt 108.9 kg   SpO2 98%   BMI 28.46 kg/m   Physical Exam Vitals signs and nursing note reviewed.  Constitutional:      Appearance: He is well-developed.  HENT:     Head: Normocephalic.     Nose: Nose normal.  Eyes:     General: No scleral icterus.    Conjunctiva/sclera: Conjunctivae normal.  Neck:     Musculoskeletal: Neck supple.     Thyroid: No thyromegaly.  Cardiovascular:     Rate and Rhythm: Normal rate and regular rhythm.     Heart sounds: No murmur. No friction rub. No gallop.   Pulmonary:     Breath sounds: No stridor. No wheezing or rales.  Chest:     Chest wall: No tenderness.  Abdominal:     General: There is no distension.     Tenderness: There is abdominal tenderness. There is no rebound.     Comments: Patient has swelling and tenderness to  abdomen.  It appears to  be a cellulitis possible abscess.  Musculoskeletal: Normal range of motion.  Lymphadenopathy:     Cervical: No cervical adenopathy.  Skin:    Findings: No erythema or rash.  Neurological:     Mental Status: He is oriented to person, place, and time.     Motor: No abnormal muscle tone.     Coordination: Coordination normal.  Psychiatric:        Behavior: Behavior normal.      ED Treatments / Results  Labs (all labs ordered are listed, but only abnormal results are displayed) Labs Reviewed  CBC WITH DIFFERENTIAL/PLATELET - Abnormal; Notable for the following components:      Result Value   WBC 15.2 (*)    RBC 3.92 (*)    Hemoglobin 12.2 (*)    HCT 38.0 (*)    Neutro Abs 12.5 (*)    Monocytes Absolute 1.5 (*)    All other components within normal limits  CULTURE, BLOOD (ROUTINE X 2)  CULTURE, BLOOD (ROUTINE X 2)  SARS CORONAVIRUS 2 (HOSPITAL ORDER, [place] [facility])  COMPREHENSIVE METABOLIC PANEL    EKG None  Radiology No results found.  Procedures Procedures (including critical care time)  Medications Ordered in ED Medications  vancomycin (VANCOCIN) IVPB 1000 mg/200 mL premix (has no administration in time range)     Initial Impression / Assessment and Plan / ED Course  I have reviewed the triage vital signs and the nursing notes.  Pertinent labs & imaging results that were available during my care of the patient were reviewed by me and considered in my medical decision making (see chart for details).   Patient with cellulitis/abscess.  CT scan pending    CT shows cellulitis patient will be admitted  Final Clinical Impressions(s) / ED Diagnoses   Final diagnoses:  None    ED Discharge Orders    None       Milton Ferguson, MD 04/24/19 1644

## 2019-04-24 NOTE — ED Triage Notes (Signed)
Per pt sent by pcp for some xrays of stomach.  Swelling and pain to lower abd since Saturday.  Denies urinary s/s, last BM this morning.

## 2019-04-26 ENCOUNTER — Other Ambulatory Visit: Payer: Self-pay

## 2019-04-26 ENCOUNTER — Emergency Department (HOSPITAL_COMMUNITY)
Admission: EM | Admit: 2019-04-26 | Discharge: 2019-04-26 | Disposition: A | Discharge status: Home / Self Care | Payer: Medicare Other | Attending: Emergency Medicine | Admitting: Emergency Medicine

## 2019-04-26 ENCOUNTER — Encounter (HOSPITAL_COMMUNITY): Payer: Self-pay

## 2019-04-26 DIAGNOSIS — Z4800 Encounter for change or removal of nonsurgical wound dressing: Secondary | ICD-10-CM | POA: Diagnosis not present

## 2019-04-26 DIAGNOSIS — Z96653 Presence of artificial knee joint, bilateral: Secondary | ICD-10-CM | POA: Diagnosis not present

## 2019-04-26 DIAGNOSIS — Z87891 Personal history of nicotine dependence: Secondary | ICD-10-CM | POA: Diagnosis not present

## 2019-04-26 DIAGNOSIS — L02211 Cutaneous abscess of abdominal wall: Secondary | ICD-10-CM | POA: Insufficient documentation

## 2019-04-26 DIAGNOSIS — E039 Hypothyroidism, unspecified: Secondary | ICD-10-CM | POA: Diagnosis not present

## 2019-04-26 DIAGNOSIS — Z5189 Encounter for other specified aftercare: Secondary | ICD-10-CM | POA: Diagnosis not present

## 2019-04-26 DIAGNOSIS — I10 Essential (primary) hypertension: Secondary | ICD-10-CM | POA: Insufficient documentation

## 2019-04-26 LAB — CHROMOGRANIN A: Chromogranin A (ng/mL): 180.4 ng/mL — ABNORMAL HIGH (ref 0.0–101.8)

## 2019-04-26 NOTE — ED Provider Notes (Signed)
Emergency Department Provider Note   I have reviewed the triage vital signs and the nursing notes.   HISTORY  Chief Complaint Wound Check   HPI [name] is a 68 y.o. male returns to the emergency department for evaluation of abdominal wall abscess.  Patient states that the area was drained 2 days ago.  These notes were reviewed in epic.  Patient had packing placed and was told to return for wound recheck in 48 hours which he is doing today.  He has been taking the antibiotic as prescribed.  He reports improved symptoms including pain and drainage.  Denies fever.  No bleeding.  He has been changing the external dressings daily at home.  No other new or worsening symptoms.     Past Medical History:  Diagnosis Date  . Anemia    FeDA: GASTRIC POLYPS, B12; TCS 2008 EGD 2009, 2008-HB 11.1 MCV 83.6 CR 1.22, 2009 FERRITIN 102-22  . B12 deficiency   . Carcinoid tumor determined by biopsy of stomach 09/25/2014  . Chronic knee pain   . Diverticulosis of colon    Lower GI bleed 2008  . Essential hypertension   . GERD (gastroesophageal reflux disease)   . Gout   . Hepatomegaly    Hepatic steatosis  . History of alcohol abuse   . History of septic arthritis   . Hypothyroidism   . PUD   . Substance abuse [facility])     Patient Active Problem List   Diagnosis Date Noted  . Colon polyps 12/28/2018  . UTI (urinary tract infection) 09/22/2018  . AKI (acute kidney injury) ([place]) 09/22/2018  . Left inguinal hernia 09/20/2018  . S/P total knee replacement, left 06/29/06 01/27/2018  . Hyperlipidemia 10/23/2016  . Mild intellectual disabilities (CODE) 09/29/2016  . Personal history of gout 09/22/2016  . Literacy level of illiterate 09/22/2016  . S/P TKR (total knee replacement), right 09/18/08 09/22/2016  . Carcinoid tumor determined by biopsy of stomach 04/10/2016  . Tubulovillous adenoma of colon 09/12/2015  . Neuroendocrine tumor 09/25/2014  . Elevated LFTs 07/17/2014  . Iron  deficiency anemia 07/29/2010  . Fatty liver 07/29/2010  . Alcohol abuse 11/30/2008  . Osteoarthrosis involving lower leg 08/20/2008  . Essential hypertension 10/20/2006  . B12 deficiency 08/15/2006  . GERD 08/15/2006  . IBS 08/15/2006  . BPH without obstruction/lower urinary tract symptoms 08/15/2006  . OSTEOARTHRITIS 08/15/2006  . DEGENERATION, DISC NOS 08/15/2006    Past Surgical History:  Procedure Laterality Date  . BIOPSY  08/14/2014   Procedure: BIOPSY;  Surgeon: Danie Binder, MD;  Location: [facility];  Service: Endoscopy;;  . BIOPSY  03/12/2015   Procedure: BIOPSY;  Surgeon: Danie Binder, MD;  Location: [facility];  Service: Endoscopy;;  . BIOPSY  04/21/2016   Procedure: BIOPSY;  Surgeon: Danie Binder, MD;  [facility];  Service: Endoscopy;;  bx's of antrum, body of stomach, fundus, and cardia  . CHOLECYSTECTOMY    . COLONOSCOPY  2008 [facility] DJ   LGIB 2o to TICS, prep good  . COLONOSCOPY WITH PROPOFOL N/A 08/14/2014   SLF: 1. Four large colon polyps removed. 2. The left colon is redundant 3. Moderate diverticulosis throughout teh entire examined colon  . COLONOSCOPY WITH PROPOFOL N/A 11/06/2014   SLF: 8 small polyps removed. One large pedunculated polyp removed from the ascending colon, tubulovillous and tubular adenomas. Next colonoscopy March 2019  . COLONOSCOPY WITH PROPOFOL N/A 01/04/2018   Procedure: COLONOSCOPY WITH PROPOFOL;  Surgeon: Oneida Alar,  Marga Melnick, MD;  [facility];  Service: Endoscopy;  Laterality: N/A;  10:00am  . ESOPHAGOGASTRODUODENOSCOPY  12/08/2007   IHW:TUUE gastric polyps seen in the cardia and body of the stomach/Normal esophagus without evidence of Barrett's, mass, erosion/Normal duodenal bulb and second portion of the duodenum. Benign bx.  . ESOPHAGOGASTRODUODENOSCOPY (EGD) WITH PROPOFOL N/A 08/14/2014   SLF: 1. Heme postive stools due to gastric and colon polyps 2. Multiple gastric   . ESOPHAGOGASTRODUODENOSCOPY (EGD) WITH PROPOFOL N/A  03/12/2015   SLF: Multiple gastric nodules seen in gasric body/antrum. 2. Non-erosive gastritis ( inflammation) was found in the gastric antrum.   . ESOPHAGOGASTRODUODENOSCOPY (EGD) WITH PROPOFOL N/A 10/08/2015   Procedure: ESOPHAGOGASTRODUODENOSCOPY (EGD) WITH PROPOFOL;  Surgeon: Danie Binder, MD;  [facility];  Service: Endoscopy;  Laterality: N/A;  0945  . ESOPHAGOGASTRODUODENOSCOPY (EGD) WITH PROPOFOL N/A 04/21/2016   Procedure: ESOPHAGOGASTRODUODENOSCOPY (EGD) WITH PROPOFOL;  Surgeon: Danie Binder, MD;  [facility];  Service: Endoscopy;  Laterality: N/A;  730   . HAND SURGERY Right    fracture repair with plates  . INGUINAL HERNIA REPAIR Left 11/09/2018   Procedure: HERNIA REPAIR INGUINAL ADULT WITH MESH;  Surgeon: Virl Cagey, MD;  Location: [facility];  Service: General;  Laterality: Left;  . JOINT REPLACEMENT Bilateral    knees  . POLYPECTOMY  08/14/2014   Procedure: POLYPECTOMY;  Surgeon: Danie Binder, MD;  [facility]: [facility];  Service: Endoscopy;;  . POLYPECTOMY N/A 11/06/2014   Procedure: POLYPECTOMY;  Surgeon: Danie Binder, MD;  [facility];  Service: Endoscopy;  Laterality: N/A;  Transverse Colon x3, Ascending Colon x2, Descending Colon x3  . REPLACEMENT TOTAL KNEE BILATERAL    . UPPER GASTROINTESTINAL ENDOSCOPY  APR 2009   INFLAMED HYPERPLASTIC POLYPS, CHRONIC GASTRITIS    Allergies Aspirin  Family History  Problem Relation Age of Onset  . Diabetes Mother   . Renal Disease Mother        failure  . Diabetes Father        'old age'  . Hypertension Sister   . Hypertension Brother   . Diabetes Brother   . Diabetes Sister   . Diabetes Sister   . Diabetes Brother   . Hypertension Brother   . Diabetes Brother        right BKA  . Kidney failure Brother        on dialysis  . Kidney failure Brother        on dialysis  . Diabetes Brother        bilateral BKA  . Colon polyps Neg Hx   . Colon cancer Neg Hx   . Pancreatic disease Neg Hx      Social History Social History   Tobacco Use  . Smoking status: Former Smoker    Years: 0.00  . Smokeless tobacco: Never Used  . Tobacco comment: Quit x 10 years/ never smoked on regular basis  Substance Use Topics  . Alcohol use: Yes    Alcohol/week: 2.0 standard drinks    Types: 2 Shots of liquor per week    Comment: drinks on weekends, gin/vodka 1/5th.  . Drug use: No    Review of Systems  Constitutional: No fever/chills Eyes: No visual changes. ENT: No sore throat. Cardiovascular: Denies chest pain. Respiratory: Denies shortness of breath. Gastrointestinal: No abdominal pain.  No nausea, no vomiting.  No diarrhea.  No constipation. Genitourinary: Negative for dysuria. Musculoskeletal: Negative for back pain. Skin: Abdominal  wall abscess.  Neurological: Negative for headaches, focal weakness or numbness.  10-point ROS otherwise negative.  ____________________________________________   PHYSICAL EXAM:  VITAL SIGNS: ED Triage Vitals  Enc Vitals Group     BP 04/26/19 0805 117/79     Pulse Rate 04/26/19 0805 87     Resp 04/26/19 0805 18     Temp 04/26/19 0805 98.2 F (36.8 C)     Temp Source 04/26/19 0805 Oral     SpO2 04/26/19 0805 98 %     Weight 04/26/19 0803 230 lb (104.3 kg)     Height 04/26/19 0803 6' (1.829 m)   Constitutional: Alert and oriented. Well appearing and in no acute distress. Eyes: Conjunctivae are normal.  Head: Atraumatic. Nose: No congestion/rhinnorhea. Mouth/Throat: Mucous membranes are moist.  Neck: No stridor.   Cardiovascular: Normal rate, regular rhythm. Respiratory: Normal respiratory effort.  Gastrointestinal: Soft and nontender. No distention. 0.25 cm incision in the lower abdomen with mild surrounding cellulitis approx 4-5 cm. No active drainage. Packing remains in place. Packing removed with no purulence expressed on further evaluation.  Musculoskeletal: No gross deformities of extremities. Neurologic:  Normal speech and  language.  Skin:  Skin is warm and dry. 5 cm of surrouding induration with minimal redness. No fluctuance in the lower abdomen. Packing in place.   ____________________________________________   PROCEDURES  Procedure(s) performed:   Wound packing  Date/Time: 04/26/2019 8:49 AM Performed by: Margette Fast, MD Authorized by: Margette Fast, MD  Consent: Verbal consent obtained. Risks and benefits: risks, benefits and alternatives were discussed Consent given by: patient Required items: required blood products, implants, devices, and special equipment available Patient identity confirmed: verbally with patient Preparation: Patient was prepped and draped in the usual sterile fashion. Local anesthesia used: no  Anesthesia: Local anesthesia used: no  Sedation: Patient sedated: no  Patient tolerance: patient tolerated the procedure well with no immediate complications Comments: 1/4 in gauze packing removed in its entirety from the wound.  The wound opening was explored with no purulence expressed.  I replaced the packing with some continued induration but overall minimal erythema surrounding the wound.  Patient did not have discomfort during the procedure and did not require local anesthetic or sedation.  Sterile dressing placed over the packing material.        ____________________________________________   INITIAL IMPRESSION / ASSESSMENT AND PLAN / ED COURSE  Pertinent labs & imaging results that were available during my care of the patient were reviewed by me and considered in my medical decision making (see chart for details).   Patient presents to the emergency department for evaluation of wound recheck and packing replacement.  He continues to have some induration surrounding the immediate incision but no purulence.  Minimal erythema.  Afebrile here and at home.  Subjectively improved symptoms.  I have replaced the packing and dressing.  Plan for the patient to continue  antibiotics and return in 2 days for packing removal. Discussed ED return precautions with the patient in detail.    ____________________________________________  FINAL CLINICAL IMPRESSION(S) / ED DIAGNOSES  Final diagnoses:  Wound check, abscess    Note:  This document was prepared using Dragon voice recognition software and may include unintentional dictation errors.  Nanda Quinton, MD Emergency Medicine    Marletta Bousquet, Wonda Old[facility], MD 04/26/19 [phone]

## 2019-04-26 NOTE — Patient Outreach (Signed)
[facility]) Care Management  04/26/2019  Abraham Entwistle 08/31/1951 [id number]   Referral received from [facility] team. Per chart review, Mr. Mccleod was evaluated in the Emergency Department on 04/24/19 and 04/26/19 d/t abdominal wall abscess.  Successful outreach with Mr. Toner. HIPAA identifiers verified. Discussed recent ED visits, care management needs and available services. Mr. Rog declined [facility] services. He agreed to call if his health needs change and care management services are required.  PLAN -Will notify Primary Care Provider and complete case closure.  [place] [facility] Management [phone]

## 2019-04-26 NOTE — Discharge Instructions (Signed)
You were seen in the emergency department today for a dressing change.  I removed and then replaced the packing in your abdominal wound.  This will help it to continue healing.  Please continue your antibiotics.  We will see you in 2 days for packing removal.  Return to the emergency department sooner if you develop worsening pain, drainage, fever, or other severe symptoms.

## 2019-04-26 NOTE — ED Triage Notes (Signed)
Pt says was here recently with wound to abd and here today for recheck.   Denies complaints.

## 2019-04-28 ENCOUNTER — Encounter (HOSPITAL_COMMUNITY): Payer: Self-pay | Admitting: *Deleted

## 2019-04-28 ENCOUNTER — Other Ambulatory Visit: Payer: Self-pay

## 2019-04-28 ENCOUNTER — Emergency Department (HOSPITAL_COMMUNITY)
Admission: EM | Admit: 2019-04-28 | Discharge: 2019-04-28 | Disposition: A | Discharge status: Home / Self Care | Payer: Medicare Other | Attending: Emergency Medicine | Admitting: Emergency Medicine

## 2019-04-28 DIAGNOSIS — I10 Essential (primary) hypertension: Secondary | ICD-10-CM | POA: Insufficient documentation

## 2019-04-28 DIAGNOSIS — Z4800 Encounter for change or removal of nonsurgical wound dressing: Secondary | ICD-10-CM | POA: Diagnosis not present

## 2019-04-28 DIAGNOSIS — Z87891 Personal history of nicotine dependence: Secondary | ICD-10-CM | POA: Insufficient documentation

## 2019-04-28 DIAGNOSIS — E039 Hypothyroidism, unspecified: Secondary | ICD-10-CM | POA: Insufficient documentation

## 2019-04-28 DIAGNOSIS — L02211 Cutaneous abscess of abdominal wall: Secondary | ICD-10-CM | POA: Insufficient documentation

## 2019-04-28 DIAGNOSIS — Z5189 Encounter for other specified aftercare: Secondary | ICD-10-CM

## 2019-04-28 NOTE — Discharge Instructions (Signed)
You were seen in the emergency department today for wound check.  Your wound looks like it is getting much better.  I have removed the packing material.  You can place a gauze pad over the area for the next 2 days.  Finish your antibiotics and call your primary care doctor today to schedule a follow-up appointment next week for a final wound check.  Return to the emergency department if you develop fever, worsening redness around the wound, drainage, or severe pain.

## 2019-04-28 NOTE — ED Triage Notes (Signed)
Patient here for recheck of abdominal abscess that was drained earlier this week. Patient has no complaints.

## 2019-04-28 NOTE — ED Provider Notes (Signed)
Emergency Department Provider Note   I have reviewed the triage vital signs and the nursing notes.   HISTORY  Chief Complaint Wound Check   HPI [name] is a 68 y.o. male returns to the emergency department for evaluation of wound check for abdominal wall abscess.  I evaluated the patient 2 days prior and repacked his wound.  Patient states that he has continued dressing changes as instructed and continues to take his antibiotic.  He is not experienced worsening pain, redness, drainage in the area.  No new lesions.  He states that overall he is feeling much better.  No radiation of symptoms or other modifying factors.   Past Medical History:  Diagnosis Date  . Anemia    FeDA: GASTRIC POLYPS, B12; TCS 2008 EGD 2009, 2008-HB 11.1 MCV 83.6 CR 1.22, 2009 FERRITIN 102-22  . B12 deficiency   . Carcinoid tumor determined by biopsy of stomach 09/25/2014  . Chronic knee pain   . Diverticulosis of colon    Lower GI bleed 2008  . Essential hypertension   . GERD (gastroesophageal reflux disease)   . Gout   . Hepatomegaly    Hepatic steatosis  . History of alcohol abuse   . History of septic arthritis   . Hypothyroidism   . PUD   . Substance abuse [facility])     Patient Active Problem List   Diagnosis Date Noted  . Colon polyps 12/28/2018  . UTI (urinary tract infection) 09/22/2018  . AKI (acute kidney injury) ([place]) 09/22/2018  . Left inguinal hernia 09/20/2018  . S/P total knee replacement, left 06/29/06 01/27/2018  . Hyperlipidemia 10/23/2016  . Mild intellectual disabilities (CODE) 09/29/2016  . Personal history of gout 09/22/2016  . Literacy level of illiterate 09/22/2016  . S/P TKR (total knee replacement), right 09/18/08 09/22/2016  . Carcinoid tumor determined by biopsy of stomach 04/10/2016  . Tubulovillous adenoma of colon 09/12/2015  . Neuroendocrine tumor 09/25/2014  . Elevated LFTs 07/17/2014  . Iron deficiency anemia 07/29/2010  . Fatty liver 07/29/2010  .  Alcohol abuse 11/30/2008  . Osteoarthrosis involving lower leg 08/20/2008  . Essential hypertension 10/20/2006  . B12 deficiency 08/15/2006  . GERD 08/15/2006  . IBS 08/15/2006  . BPH without obstruction/lower urinary tract symptoms 08/15/2006  . OSTEOARTHRITIS 08/15/2006  . DEGENERATION, DISC NOS 08/15/2006    Past Surgical History:  Procedure Laterality Date  . BIOPSY  08/14/2014   Procedure: BIOPSY;  Surgeon: Danie Binder, MD;  [facility];  Service: Endoscopy;;  . BIOPSY  03/12/2015   Procedure: BIOPSY;  Surgeon: Danie Binder, MD;  Location: [facility];  Service: Endoscopy;;  . BIOPSY  04/21/2016   Procedure: BIOPSY;  Surgeon: Danie Binder, MD;  [facility];  Service: Endoscopy;;  bx's of antrum, body of stomach, fundus, and cardia  . CHOLECYSTECTOMY    . COLONOSCOPY  2008 [facility] DJ   LGIB 2o to TICS, prep good  . COLONOSCOPY WITH PROPOFOL N/A 08/14/2014   SLF: 1. Four large colon polyps removed. 2. The left colon is redundant 3. Moderate diverticulosis throughout teh entire examined colon  . COLONOSCOPY WITH PROPOFOL N/A 11/06/2014   SLF: 8 small polyps removed. One large pedunculated polyp removed from the ascending colon, tubulovillous and tubular adenomas. Next colonoscopy March 2019  . COLONOSCOPY WITH PROPOFOL N/A 01/04/2018   Procedure: COLONOSCOPY WITH PROPOFOL;  Surgeon: Danie Binder, MD;  [facility];  Service: Endoscopy;  Laterality: N/A;  10:00am  .  ESOPHAGOGASTRODUODENOSCOPY  12/08/2007   HQI:ONGE gastric polyps seen in the cardia and body of the stomach/Normal esophagus without evidence of Barrett's, mass, erosion/Normal duodenal bulb and second portion of the duodenum. Benign bx.  . ESOPHAGOGASTRODUODENOSCOPY (EGD) WITH PROPOFOL N/A 08/14/2014   SLF: 1. Heme postive stools due to gastric and colon polyps 2. Multiple gastric   . ESOPHAGOGASTRODUODENOSCOPY (EGD) WITH PROPOFOL N/A 03/12/2015   SLF: Multiple gastric nodules seen in gasric  body/antrum. 2. Non-erosive gastritis ( inflammation) was found in the gastric antrum.   . ESOPHAGOGASTRODUODENOSCOPY (EGD) WITH PROPOFOL N/A 10/08/2015   Procedure: ESOPHAGOGASTRODUODENOSCOPY (EGD) WITH PROPOFOL;  Surgeon: Danie Binder, MD;  [facility];  Service: Endoscopy;  Laterality: N/A;  0945  . ESOPHAGOGASTRODUODENOSCOPY (EGD) WITH PROPOFOL N/A 04/21/2016   Procedure: ESOPHAGOGASTRODUODENOSCOPY (EGD) WITH PROPOFOL;  Surgeon: Danie Binder, MD;  [facility];  Service: Endoscopy;  Laterality: N/A;  730   . HAND SURGERY Right    fracture repair with plates  . INGUINAL HERNIA REPAIR Left 11/09/2018   Procedure: HERNIA REPAIR INGUINAL ADULT WITH MESH;  Surgeon: Virl Cagey, MD;  Location: [facility];  Service: General;  Laterality: Left;  . JOINT REPLACEMENT Bilateral    knees  . POLYPECTOMY  08/14/2014   Procedure: POLYPECTOMY;  Surgeon: Danie Binder, MD;  Location: [facility];  Service: Endoscopy;;  . POLYPECTOMY N/A 11/06/2014   Procedure: POLYPECTOMY;  Surgeon: Danie Binder, MD;  Location: [facility];  Service: Endoscopy;  Laterality: N/A;  Transverse Colon x3, Ascending Colon x2, Descending Colon x3  . REPLACEMENT TOTAL KNEE BILATERAL    . UPPER GASTROINTESTINAL ENDOSCOPY  APR 2009   INFLAMED HYPERPLASTIC POLYPS, CHRONIC GASTRITIS    Allergies Aspirin  Family History  Problem Relation Age of Onset  . Diabetes Mother   . Renal Disease Mother        failure  . Diabetes Father        'old age'  . Hypertension Sister   . Hypertension Brother   . Diabetes Brother   . Diabetes Sister   . Diabetes Sister   . Diabetes Brother   . Hypertension Brother   . Diabetes Brother        right BKA  . Kidney failure Brother        on dialysis  . Kidney failure Brother        on dialysis  . Diabetes Brother        bilateral BKA  . Colon polyps Neg Hx   . Colon cancer Neg Hx   . Pancreatic disease Neg Hx     Social History Social History   Tobacco Use  .  Smoking status: Former Smoker    Years: 0.00  . Smokeless tobacco: Never Used  . Tobacco comment: Quit x 10 years/ never smoked on regular basis  Substance Use Topics  . Alcohol use: Yes    Alcohol/week: 2.0 standard drinks    Types: 2 Shots of liquor per week    Comment: drinks on weekends, gin/vodka 1/5th.  . Drug use: No    Review of Systems  Constitutional: No fever/chills Skin: Positive for rash.  Neurological: Negative for headaches.  10-point ROS otherwise negative.  ____________________________________________   PHYSICAL EXAM:  VITAL SIGNS: ED Triage Vitals  Enc Vitals Group     BP 04/28/19 0816 131/87     Pulse Rate 04/28/19 0816 81     Resp 04/28/19 0816 15     Temp 04/28/19  0816 98.3 F (36.8 C)     Temp Source 04/28/19 0816 Oral     SpO2 04/28/19 0816 100 %     Weight 04/28/19 0817 230 lb (104.3 kg)     Height 04/28/19 0817 6' (1.829 m)   Constitutional: Alert and oriented. Well appearing and in no acute distress. Eyes: Conjunctivae are normal.  Head: Atraumatic. Nose: No congestion/rhinnorhea. Mouth/Throat: Mucous membranes are moist.  Neck: No stridor.  Cardiovascular: Normal rate, regular rhythm.  Respiratory: Normal respiratory effort.   Gastrointestinal: No distention.  Musculoskeletal: No lower extremity tenderness nor edema. Neurologic:  Normal speech and language. Skin: Lower abdomen evaluated.  Of the packing is in place.  There is no residual induration or erythema.  No fluctuance.  Packing removed with no purulence expressed afterwards.   ____________________________________________   PROCEDURES  Procedure(s) performed:   Procedures  None  ____________________________________________   INITIAL IMPRESSION / ASSESSMENT AND PLAN / ED COURSE  Pertinent labs & imaging results that were available during my care of the patient were reviewed by me and considered in my medical decision making (see chart for details).   Patient presents  to the emergency department for evaluation of abdominal wall abscess which was drained and has return to the ER for subsequent packing.  Wound is much improved from when I evaluated 2 days ago.  Patient has additional antibiotic to finish.  I remove the packing and do not feel it needs additional packing at this time.  Discussed wound care for the next 2 days, PCP follow-up next week for wound evaluation, and plan to complete antibiotics.  Discussed ED return precautions in detail.   ____________________________________________  FINAL CLINICAL IMPRESSION(S) / ED DIAGNOSES  Final diagnoses:  Visit for wound check    Note:  This document was prepared using Dragon voice recognition software and may include unintentional dictation errors.  Nanda Quinton, MD Emergency Medicine    Long, [facility], MD 04/28/19 [phone]

## 2019-04-29 LAB — CULTURE, BLOOD (ROUTINE X 2)
Culture: NO GROWTH
Culture: NO GROWTH
Special Requests: ADEQUATE
Special Requests: ADEQUATE

## 2019-05-03 ENCOUNTER — Ambulatory Visit (INDEPENDENT_AMBULATORY_CARE_PROVIDER_SITE_OTHER): Payer: Medicare Other | Admitting: Nurse Practitioner

## 2019-05-03 ENCOUNTER — Other Ambulatory Visit: Payer: Self-pay

## 2019-05-03 ENCOUNTER — Encounter: Payer: Self-pay | Admitting: Nurse Practitioner

## 2019-05-03 VITALS — BP 136/84 | HR 77 | Temp 96.9°F | Ht 72.0 in | Wt 240.6 lb

## 2019-05-03 DIAGNOSIS — K219 Gastro-esophageal reflux disease without esophagitis: Secondary | ICD-10-CM | POA: Diagnosis not present

## 2019-05-03 DIAGNOSIS — D3A092 Benign carcinoid tumor of the stomach: Secondary | ICD-10-CM | POA: Diagnosis not present

## 2019-05-03 DIAGNOSIS — K635 Polyp of colon: Secondary | ICD-10-CM | POA: Diagnosis not present

## 2019-05-03 NOTE — Assessment & Plan Note (Signed)
No concerning symptoms to justify early interval colonoscopy.  Next due in 2024.  He is on recall for his repeat colonoscopy.  Follow-up in 1 year.

## 2019-05-03 NOTE — Assessment & Plan Note (Signed)
History of carcinoid tumor of the stomach status post biopsy.  The patient had mild elevation in tumor markers although he deferred repeat endoscopy.  Follow-up PET scan without evidence of well-differentiated neuroendocrine tumor, no metastasis, no other concerns.  Recommended he continue his current medications, call for any stool changes, worsening GERD, dysphasia.  He was initially scheduled for swallowing difficulties, but he denies this today.  Has not had any swallowing difficulties in the recent past.  Follow-up in 1 year.

## 2019-05-03 NOTE — Progress Notes (Signed)
Referring Provider: [name], MD Primary Care Physician:  [name], MD Primary GI:  Dr. Oneida Alar  Chief Complaint  Patient presents with  . Dysphagia    reports he has been doing well    HPI:   [name] is a 68 y.o. male who presents for difficulty swallowing.  Patient was last seen in our office 12/28/2018 for carcinoid tumor determined by biopsy of the stomach, colon polyps.  The patient's last visit was a virtual office visit due to COVID-19/coronavirus pandemic.  At that time the patient did not have any concerns, appetite is good.  Wants to think about an upper endoscopy.  Previous colonoscopy in May 2019 without polyps.  Next colonoscopy due May 2024.  Carcinoid tumor symptoms controlled/resolved without bright red blood per rectum or melena or abdominal pain.  Discussed abnormal labs and the patient declined EGD at that time.  Recommended to follow-up in 1 year.  Recent labs 09/27/2018 include serotonin, chromogranin A, lactate dehydrogenase.  His chromogranin A was elevated, other labs essentially normal.  CMP with stable creatinine, mild elevation of AST at 49.  CBC with stable/improved anemia with hemoglobin of 12.1 and otherwise normal.  PET scan as follow-up for labs completed 10/18/2018 found no evidence of well differentiated neuroendocrine tumor, no abnormal radiotracer accumulation within the stomach site, no evidence of metastasis.  There was notation of a left upper lobe pulmonary nodule and recommended noncontrast chest CT in 6 to 12 months.  Recent abdominal CT on 04/24/2019 essentially normal other than a new 4.5 x 5.2 cm area of the abdominal muscle wall consistent with inflamed or infected subcutaneous sebaceous cyst with surrounding cellulitis.  Today he states he's doing well overall. Denies dysphagia. GERD well managed on PPI. Is happy about PET results. Denies abdominal pain, N/V, hematochezia, melena, fever, chills, unintentional weight loss. Denies URI or  flu-like symptoms. Denies loss of sense of taste or smell. Denies chest pain, dyspnea, dizziness, lightheadedness, syncope, near syncope. Denies any other upper or lower GI symptoms.  The infectious cyst was drained at the hospital. Was not having any symptoms related to this. He is on Doxycycline for the infected cyst, almost done.  Past Medical History:  Diagnosis Date  . Anemia    FeDA: GASTRIC POLYPS, B12; TCS 2008 EGD 2009, 2008-HB 11.1 MCV 83.6 CR 1.22, 2009 FERRITIN 102-22  . B12 deficiency   . Carcinoid tumor determined by biopsy of stomach 09/25/2014  . Chronic knee pain   . Diverticulosis of colon    Lower GI bleed 2008  . Essential hypertension   . GERD (gastroesophageal reflux disease)   . Gout   . Hepatomegaly    Hepatic steatosis  . History of alcohol abuse   . History of septic arthritis   . Hypothyroidism   . PUD   . Substance abuse [facility])     Past Surgical History:  Procedure Laterality Date  . BIOPSY  08/14/2014   Procedure: BIOPSY;  Surgeon: Danie Binder, MD;  Location: [facility];  Service: Endoscopy;;  . BIOPSY  03/12/2015   Procedure: BIOPSY;  Surgeon: Danie Binder, MD;  Location: [facility];  Service: Endoscopy;;  . BIOPSY  04/21/2016   Procedure: BIOPSY;  Surgeon: Danie Binder, MD;  Location: [facility];  Service: Endoscopy;;  bx's of antrum, body of stomach, fundus, and cardia  . CHOLECYSTECTOMY    . COLONOSCOPY  2008 [facility] DJ   LGIB 2o to TICS, prep good  .  COLONOSCOPY WITH PROPOFOL N/A 08/14/2014   SLF: 1. Four large colon polyps removed. 2. The left colon is redundant 3. Moderate diverticulosis throughout teh entire examined colon  . COLONOSCOPY WITH PROPOFOL N/A 11/06/2014   SLF: 8 small polyps removed. One large pedunculated polyp removed from the ascending colon, tubulovillous and tubular adenomas. Next colonoscopy March 2019  . COLONOSCOPY WITH PROPOFOL N/A 01/04/2018   Procedure: COLONOSCOPY WITH PROPOFOL;  Surgeon: Danie Binder, MD;  Location: [facility];  Service: Endoscopy;  Laterality: N/A;  10:00am  . ESOPHAGOGASTRODUODENOSCOPY  12/08/2007   AST:MHDQ gastric polyps seen in the cardia and body of the stomach/Normal esophagus without evidence of Barrett's, mass, erosion/Normal duodenal bulb and second portion of the duodenum. Benign bx.  . ESOPHAGOGASTRODUODENOSCOPY (EGD) WITH PROPOFOL N/A 08/14/2014   SLF: 1. Heme postive stools due to gastric and colon polyps 2. Multiple gastric   . ESOPHAGOGASTRODUODENOSCOPY (EGD) WITH PROPOFOL N/A 03/12/2015   SLF: Multiple gastric nodules seen in gasric body/antrum. 2. Non-erosive gastritis ( inflammation) was found in the gastric antrum.   . ESOPHAGOGASTRODUODENOSCOPY (EGD) WITH PROPOFOL N/A 10/08/2015   Procedure: ESOPHAGOGASTRODUODENOSCOPY (EGD) WITH PROPOFOL;  Surgeon: Danie Binder, MD;  [facility];  Service: Endoscopy;  Laterality: N/A;  0945  . ESOPHAGOGASTRODUODENOSCOPY (EGD) WITH PROPOFOL N/A 04/21/2016   Procedure: ESOPHAGOGASTRODUODENOSCOPY (EGD) WITH PROPOFOL;  Surgeon: Danie Binder, MD;  Location: [facility];  Service: Endoscopy;  Laterality: N/A;  730   . HAND SURGERY Right    fracture repair with plates  . INGUINAL HERNIA REPAIR Left 11/09/2018   Procedure: HERNIA REPAIR INGUINAL ADULT WITH MESH;  Surgeon: Virl Cagey, MD;  Location: [facility];  Service: General;  Laterality: Left;  . JOINT REPLACEMENT Bilateral    knees  . POLYPECTOMY  08/14/2014   Procedure: POLYPECTOMY;  Surgeon: Danie Binder, MD;  Location: [facility];  Service: Endoscopy;;  . POLYPECTOMY N/A 11/06/2014   Procedure: POLYPECTOMY;  Surgeon: Danie Binder, MD;  Location: [facility];  Service: Endoscopy;  Laterality: N/A;  Transverse Colon x3, Ascending Colon x2, Descending Colon x3  . REPLACEMENT TOTAL KNEE BILATERAL    . UPPER GASTROINTESTINAL ENDOSCOPY  APR 2009   INFLAMED HYPERPLASTIC POLYPS, CHRONIC GASTRITIS    Current Outpatient Medications  Medication Sig Dispense Refill  . amLODipine  (NORVASC) 5 MG tablet TAKE 1 TABLET BY MOUTH ONCE DAILY. 90 tablet 3  . atorvastatin (LIPITOR) 20 MG tablet Take 1 tablet (20 mg total) by mouth daily. 90 tablet 3  . clindamycin (CLINDAGEL) 1 % gel Apply 1 application topically daily. Applies to back of neck    . colchicine 0.6 MG tablet TAKE 1 TABLET BY MOUTH 3 TIMES DAILY X5 DAYS FOR GOUT PAIN. 15 tablet 2  . doxycycline (VIBRAMYCIN) 100 MG capsule Take 1 capsule (100 mg total) by mouth 2 (two) times daily. 20 capsule 0  . famotidine (PEPCID) 20 MG tablet Take 1 tablet (20 mg total) by mouth daily for 7 days. 7 tablet 0  . HYDROcodone-acetaminophen (NORCO/VICODIN) 5-325 MG tablet One tablet by mouth every six hours as needed for pain.  Must last 30 days. 56 tablet 0  . iron polysaccharides (NIFEREX) 150 MG capsule Take 1 capsule (150 mg total) by mouth daily. 90 capsule 1  . levothyroxine (SYNTHROID, LEVOTHROID) 200 MCG tablet Take 1 tablet (200 mcg total) by mouth daily before breakfast. 90 tablet 3  . metoprolol succinate (TOPROL-XL) 25 MG 24 hr tablet TAKE 1 & 1/2  TABLETS BY MOUTH DAILY. (Patient taking differently: Take 37.5 mg by mouth daily. ) 45 tablet 9  . omeprazole (PRILOSEC) 20 MG capsule TAKE 1 CAPSULE BY MOUTH ONCE DAILY. (Patient taking differently: Take 20 mg by mouth daily. ) 30 capsule 3  . tamsulosin (FLOMAX) 0.4 MG CAPS capsule Take 1 capsule (0.4 mg total) by mouth daily after supper. (Patient taking differently: Take 0.4 mg by mouth daily. ) 30 capsule 3  . ULORIC 40 MG tablet TAKE 1 TABLET BY MOUTH ONCE A DAY. (Patient taking differently: Take 40 mg by mouth daily. ) 90 tablet 0   No current facility-administered medications for this visit.     Allergies as of 05/03/2019 - Review Complete 05/03/2019  Allergen Reaction Noted  . Aspirin Other (See Comments) 06/01/2007    Family History  Problem Relation Age of Onset  . Diabetes Mother   . Renal Disease Mother        failure  . Diabetes Father        'old age'  .  Hypertension Sister   . Hypertension Brother   . Diabetes Brother   . Diabetes Sister   . Diabetes Sister   . Diabetes Brother   . Hypertension Brother   . Diabetes Brother        right BKA  . Kidney failure Brother        on dialysis  . Kidney failure Brother        on dialysis  . Diabetes Brother        bilateral BKA  . Colon polyps Neg Hx   . Colon cancer Neg Hx   . Pancreatic disease Neg Hx     Social History   Socioeconomic History  . Marital status: Single    Spouse name: Not on file  . Number of children: 0  . Years of education: 3  . Highest education level: Not on file  Occupational History  . Occupation: retired    Comment: farming  Social Needs  . Financial resource strain: Not on file  . Food insecurity    Worry: Not on file    Inability: Not on file  . Transportation needs    Medical: Not on file    Non-medical: Not on file  Tobacco Use  . Smoking status: Former Smoker    Years: 0.00  . Smokeless tobacco: Never Used  . Tobacco comment: Quit x 10 years/ never smoked on regular basis  Substance and Sexual Activity  . Alcohol use: Yes    Alcohol/week: 2.0 standard drinks    Types: 2 Shots of liquor per week    Comment: drinks on weekends, gin/vodka 1/5th.  . Drug use: No  . Sexual activity: Never  Lifestyle  . Physical activity    Days per week: Not on file    Minutes per session: Not on file  . Stress: Not on file  Relationships  . Social Herbalist on phone: Not on file    Gets together: Not on file    Attends religious service: Not on file    Active member of club or organization: Not on file    Attends meetings of clubs or organizations: Not on file    Relationship status: Not on file  Other Topics Concern  . Not on file  Social History Narrative   HE DOES NOT HAVE ANY CHILDREN.   Lives alone   Does not drive   CAN NOT READ  Review of Systems: Complete ROS negative except as per HPI.   Physical Exam: BP 136/84    Pulse 77   Temp (!) 96.9 F (36.1 C) (Oral)   Ht 6' (1.829 m)   Wt 240 lb 9.6 oz (109.1 kg)   BMI 32.63 kg/m  General:   Alert and oriented. Pleasant and cooperative. Well-nourished and well-developed.  Eyes:  Without icterus, sclera clear and conjunctiva pink.  Ears:  Normal auditory acuity. Cardiovascular:  S1, S2 present without murmurs appreciated. Extremities without clubbing or edema. Respiratory:  Clear to auscultation bilaterally. No wheezes, rales, or rhonchi. No distress.  Gastrointestinal:  +BS, soft, non-tender and non-distended. No HSM noted. No guarding or rebound. No masses appreciated.  Rectal:  Deferred  Musculoskalatal:  Symmetrical without gross deformities. Neurologic:  Alert and oriented x4;  grossly normal neurologically. Psych:  Alert and cooperative. Normal mood and affect. Heme/Lymph/Immune: No excessive bruising noted.    05/03/2019 10:33 AM   Disclaimer: This note was dictated with voice recognition software. Similar sounding words can inadvertently be transcribed and may not be corrected upon review.

## 2019-05-03 NOTE — Assessment & Plan Note (Signed)
GERD currently well managed.  The patient had a chief complaint for his appointment today of "difficulty swallowing".  He denies any recent recurrent dysphagia.  Continue to monitor and notify us of any symptoms develop or if any worsening GERD.  Continue current medications and follow-up in 1 year.

## 2019-05-03 NOTE — Patient Instructions (Addendum)
Your health issues we discussed today were:   History of colon polyps: 1. Your next due for a repeat colonoscopy in 2024 2. We will notify you when you are doing  History of carcinoid tumor: 1. Your PET scan looked good 2. Continue to monitor for any changes in stools or swallowing difficulties or worsening GERD symptoms and notify us if any develop  GERD (reflux/heartburn): 1. Continue taking your current heartburn medications 2. Notify us if you have any worsening or severe symptoms  Overall I recommend:  1. Continue your other current medications 2. Call us if you have any questions or concerns 3. Return for follow-up in 1 year   Because of recent events of COVID-19 ("Coronavirus"), follow CDC recommendations:  Wash your hand frequently Avoid touching your face Stay away from people who are sick If you have symptoms such as fever, cough, shortness of breath then call your healthcare provider for further guidance If you are sick, STAY AT HOME unless otherwise directed by your healthcare provider. Follow directions from state and national officials regarding staying safe   At [facility] we value your feedback. You may receive a survey about your visit today. Please share your experience as we strive to create trusting relationships with our patients to provide genuine, compassionate, quality care.  We appreciate your understanding and patience as we review any laboratory studies, imaging, and other diagnostic tests that are ordered as we care for you. Our office policy is 5 business days for review of these results, and any emergent or urgent results are addressed in a timely manner for your best interest. If you do not hear from our office in 1 week, please contact us.   We also encourage the use of MyChart, which contains your medical information for your review as well. If you are not enrolled in this feature, an access code is on this after visit summary for  your convenience. Thank you for allowing [place] to be involved in your care.  It was great to see you today!  I hope you have a great summer!!

## 2019-05-09 ENCOUNTER — Other Ambulatory Visit (HOSPITAL_COMMUNITY): Payer: Self-pay | Admitting: *Deleted

## 2019-05-09 MED ORDER — POLYSACCHARIDE IRON COMPLEX 150 MG PO CAPS: 150.0000 mg | ORAL_CAPSULE | Freq: Every day | ORAL | 1 refills | Status: DC

## 2019-05-09 MED FILL — FERREX 150 CAPSULE: 150 | 90 days supply | Qty: 90 | Fill #0

## 2019-05-11 ENCOUNTER — Ambulatory Visit (HOSPITAL_COMMUNITY)
Admission: RE | Admit: 2019-05-11 | Discharge: 2019-05-11 | Disposition: A | Discharge status: Home / Self Care | Payer: Medicare Other | Source: Ambulatory Visit | Attending: Internal Medicine | Admitting: Internal Medicine

## 2019-05-11 ENCOUNTER — Other Ambulatory Visit: Payer: Self-pay

## 2019-05-11 DIAGNOSIS — R918 Other nonspecific abnormal finding of lung field: Secondary | ICD-10-CM | POA: Diagnosis not present

## 2019-05-11 DIAGNOSIS — R911 Solitary pulmonary nodule: Secondary | ICD-10-CM | POA: Diagnosis not present

## 2019-05-15 ENCOUNTER — Telehealth: Payer: Self-pay | Admitting: Orthopaedic Surgery

## 2019-05-15 NOTE — Telephone Encounter (Signed)
Patient requests refill on Hydrocodone/Acetaminophen 5-325  Mgs.  Qty 80  Sig: One tablet by mouth every six hours as needed for pain. Must last 30 days.  Patient states he uses [employer]

## 2019-05-16 ENCOUNTER — Other Ambulatory Visit: Payer: Self-pay

## 2019-05-16 ENCOUNTER — Encounter (HOSPITAL_COMMUNITY): Payer: Self-pay | Admitting: Hematology

## 2019-05-16 ENCOUNTER — Inpatient Hospital Stay (HOSPITAL_COMMUNITY): Payer: Medicare Other | Attending: Hematology | Admitting: Hematology

## 2019-05-16 DIAGNOSIS — R7989 Other specified abnormal findings of blood chemistry: Secondary | ICD-10-CM | POA: Insufficient documentation

## 2019-05-16 DIAGNOSIS — R911 Solitary pulmonary nodule: Secondary | ICD-10-CM | POA: Diagnosis not present

## 2019-05-16 DIAGNOSIS — I10 Essential (primary) hypertension: Secondary | ICD-10-CM | POA: Insufficient documentation

## 2019-05-16 DIAGNOSIS — C7A00 Malignant carcinoid tumor of unspecified site: Secondary | ICD-10-CM | POA: Diagnosis not present

## 2019-05-16 DIAGNOSIS — D3A8 Other benign neuroendocrine tumors: Secondary | ICD-10-CM | POA: Diagnosis not present

## 2019-05-16 DIAGNOSIS — Z79899 Other long term (current) drug therapy: Secondary | ICD-10-CM | POA: Insufficient documentation

## 2019-05-16 MED ORDER — HYDROCODONE-ACETAMINOPHEN 5-325 MG PO TABS: ORAL_TABLET | ORAL | 0 refills | Status: DC

## 2019-05-16 NOTE — Assessment & Plan Note (Addendum)
1.  Well-differentiated neuroendocrine carcinoid tumor  - Polypectomy by Dr. Oneida Alar on 08/14/2014 and 10/08/2015 with negative octreotide scan on 09/19/2014.   - His case was presented at GI tumor board in February/March 2016 with recommendation for anatomic biopsies to guide role of surgical resection.  - Anatomic biopsies in 03/2016 were negative. - His last two chromogranin A levels have been elevated, 186.0 in January 2020, in August level was 180.4.  Dr. Oneida Alar is also aware of elevated levels and has seen patient in consult and advised EGD.  Patient has refused. -CT abdomen pelvis was negative for disease recurrence.  -Patient has no signs and symptoms of carcinoid syndrome.   -I have advised patient to follow-up again with Dr. Oneida Alar.   -His last dotatate scan was done in February 2020 and was negative for disease..  2.  Pulmonary nodule.  -  6 mm LUL pulmonary nodule on NET PET scan done 10/18/2018.   -CT chest from September 2020 revealed stable left lung nodule. - Recommend to repeat CT chest 1 year.   3.  Elevated LFTs.   - Likely secondary to alcohol abuse - Recent NET PET scan done 10/18/2018 showed no abnormal liver findings.

## 2019-05-16 NOTE — Progress Notes (Signed)
[name], [place] [id number]   CLINIC:  Medical Oncology/Hematology  PCP:  [name], [place] [address] [phone]   REASON FOR VISIT:  Follow-up for Neuroendocrine tumor   CURRENT THERAPY:  Clinical Surveillance     INTERVAL HISTORY:  [name] 68 y.o. male presents today for follow up. He reports overall doing well. Denies any significant fatigue. He has been seen in the ER several times since his last visit concerning non-acute objectives. He also underwent abdominal hernia repair since his last visit. He denies any abdominal pain. No change in bowel habits. No weight loss. He is here for repeat labs and office visit.   REVIEW OF SYSTEMS:  Review of Systems  Constitutional: Negative.   HENT:  Negative.   Eyes: Negative.   Respiratory: Negative.   Cardiovascular: Negative.   Endocrine: Negative.   Genitourinary: Negative.    Musculoskeletal: Positive for arthralgias.  Skin: Negative.   Neurological: Negative.   Hematological: Negative.   Psychiatric/Behavioral: Negative.      PAST MEDICAL/SURGICAL HISTORY:  Past Medical History:  Diagnosis Date  . Anemia    FeDA: GASTRIC POLYPS, B12; TCS 2008 EGD 2009, 2008-HB 11.1 MCV 83.6 CR 1.22, 2009 FERRITIN 102-22  . B12 deficiency   . Carcinoid tumor determined by biopsy of stomach 09/25/2014  . Chronic knee pain   . Diverticulosis of colon    Lower GI bleed 2008  . Essential hypertension   . GERD (gastroesophageal reflux disease)   . Gout   . Hepatomegaly    Hepatic steatosis  . History of alcohol abuse   . History of septic arthritis   . Hypothyroidism   . PUD   . Substance abuse [facility])    Past Surgical History:  Procedure Laterality Date  . BIOPSY  08/14/2014   Procedure: BIOPSY;  Surgeon: Danie Binder, MD;  Location: [facility];  Service: Endoscopy;;  . BIOPSY  03/12/2015   Procedure: BIOPSY;  Surgeon: Danie Binder, MD;  Location:  [facility];  Service: Endoscopy;;  . BIOPSY  04/21/2016   Procedure: BIOPSY;  Surgeon: Danie Binder, MD;  [facility];  Service: Endoscopy;;  bx's of antrum, body of stomach, fundus, and cardia  . CHOLECYSTECTOMY    . COLONOSCOPY  2008 [facility] DJ   LGIB 2o to TICS, prep good  . COLONOSCOPY WITH PROPOFOL N/A 08/14/2014   SLF: 1. Four large colon polyps removed. 2. The left colon is redundant 3. Moderate diverticulosis throughout teh entire examined colon  . COLONOSCOPY WITH PROPOFOL N/A 11/06/2014   SLF: 8 small polyps removed. One large pedunculated polyp removed from the ascending colon, tubulovillous and tubular adenomas. Next colonoscopy March 2019  . COLONOSCOPY WITH PROPOFOL N/A 01/04/2018   Procedure: COLONOSCOPY WITH PROPOFOL;  Surgeon: Danie Binder, MD;  [facility];  Service: Endoscopy;  Laterality: N/A;  10:00am  . ESOPHAGOGASTRODUODENOSCOPY  12/08/2007   [facility]N:LGXQ gastric polyps seen in the cardia and body of the stomach/Normal esophagus without evidence of Barrett's, mass, erosion/Normal duodenal bulb and second portion of the duodenum. Benign bx.  . ESOPHAGOGASTRODUODENOSCOPY (EGD) WITH PROPOFOL N/A 08/14/2014   SLF: 1. Heme postive stools due to gastric and colon polyps 2. Multiple gastric   . ESOPHAGOGASTRODUODENOSCOPY (EGD) WITH PROPOFOL N/A 03/12/2015   SLF: Multiple gastric nodules seen in gasric body/antrum. 2. Non-erosive gastritis ( inflammation) was found in the gastric antrum.   Marland Kitchen  ESOPHAGOGASTRODUODENOSCOPY (EGD) WITH PROPOFOL N/A 10/08/2015   Procedure: ESOPHAGOGASTRODUODENOSCOPY (EGD) WITH PROPOFOL;  Surgeon: Danie Binder, MD;  [facility];  Service: Endoscopy;  Laterality: N/A;  0945  . ESOPHAGOGASTRODUODENOSCOPY (EGD) WITH PROPOFOL N/A 04/21/2016   Procedure: ESOPHAGOGASTRODUODENOSCOPY (EGD) WITH PROPOFOL;  Surgeon: Danie Binder, MD;  [facility];  Service: Endoscopy;  Laterality: N/A;  730   . HAND SURGERY Right    fracture  repair with plates  . INGUINAL HERNIA REPAIR Left 11/09/2018   Procedure: HERNIA REPAIR INGUINAL ADULT WITH MESH;  Surgeon: Virl Cagey, MD;  Location: [facility];  Service: General;  Laterality: Left;  . JOINT REPLACEMENT Bilateral    knees  . POLYPECTOMY  08/14/2014   Procedure: POLYPECTOMY;  Surgeon: Danie Binder, MD;  Location: [facility];  Service: Endoscopy;;  . POLYPECTOMY N/A 11/06/2014   Procedure: POLYPECTOMY;  Surgeon: Danie Binder, MD;  Location: [facility];  Service: Endoscopy;  Laterality: N/A;  Transverse Colon x3, Ascending Colon x2, Descending Colon x3  . REPLACEMENT TOTAL KNEE BILATERAL    . UPPER GASTROINTESTINAL ENDOSCOPY  APR 2009   INFLAMED HYPERPLASTIC POLYPS, CHRONIC GASTRITIS     SOCIAL HISTORY:  Social History   Socioeconomic History  . Marital status: Single    Spouse name: Not on file  . Number of children: 0  . Years of education: 3  . Highest education level: Not on file  Occupational History  . Occupation: retired    Comment: farming  Social Needs  . Financial resource strain: Not on file  . Food insecurity    Worry: Not on file    Inability: Not on file  . Transportation needs    Medical: Not on file    Non-medical: Not on file  Tobacco Use  . Smoking status: Former Smoker    Years: 0.00  . Smokeless tobacco: Never Used  . Tobacco comment: Quit x 10 years/ never smoked on regular basis  Substance and Sexual Activity  . Alcohol use: Yes    Alcohol/week: 2.0 standard drinks    Types: 2 Shots of liquor per week    Comment: drinks on weekends, gin/vodka 1/5th.  . Drug use: No  . Sexual activity: Never  Lifestyle  . Physical activity    Days per week: Not on file    Minutes per session: Not on file  . Stress: Not on file  Relationships  . Social Herbalist on phone: Not on file    Gets together: Not on file    Attends religious service: Not on file    Active member of club or organization: Not on file    Attends meetings of  clubs or organizations: Not on file    Relationship status: Not on file  . Intimate partner violence    Fear of current or ex partner: Not on file    Emotionally abused: Not on file    Physically abused: Not on file    Forced sexual activity: Not on file  Other Topics Concern  . Not on file  Social History Narrative   HE DOES NOT HAVE ANY CHILDREN.   Lives alone   Does not drive   CAN NOT READ    FAMILY HISTORY:  Family History  Problem Relation Age of Onset  . Diabetes Mother   . Renal Disease Mother        failure  . Diabetes Father        '  old age'  . Hypertension Sister   . Hypertension Brother   . Diabetes Brother   . Diabetes Sister   . Diabetes Sister   . Diabetes Brother   . Hypertension Brother   . Diabetes Brother        right BKA  . Kidney failure Brother        on dialysis  . Kidney failure Brother        on dialysis  . Diabetes Brother        bilateral BKA  . Colon polyps Neg Hx   . Colon cancer Neg Hx   . Pancreatic disease Neg Hx     CURRENT MEDICATIONS:  Outpatient Encounter Medications as of 05/16/2019  Medication Sig  . amLODipine (NORVASC) 5 MG tablet TAKE 1 TABLET BY MOUTH ONCE DAILY.  Marland Kitchen atorvastatin (LIPITOR) 20 MG tablet Take 1 tablet (20 mg total) by mouth daily.  . clindamycin (CLINDAGEL) 1 % gel Apply 1 application topically daily. Applies to back of neck  . colchicine 0.6 MG tablet TAKE 1 TABLET BY MOUTH 3 TIMES DAILY X5 DAYS FOR GOUT PAIN.  Marland Kitchen doxycycline (VIBRAMYCIN) 100 MG capsule Take 1 capsule (100 mg total) by mouth 2 (two) times daily.  Marland Kitchen HYDROcodone-acetaminophen (NORCO/VICODIN) 5-325 MG tablet One tablet by mouth every six hours as needed for pain.  Must last 30 days.  . iron polysaccharides (NIFEREX) 150 MG capsule Take 1 capsule (150 mg total) by mouth daily.  Marland Kitchen levothyroxine (SYNTHROID, LEVOTHROID) 200 MCG tablet Take 1 tablet (200 mcg total) by mouth daily before breakfast.  . metoprolol succinate (TOPROL-XL) 25 MG 24 hr  tablet TAKE 1 & 1/2 TABLETS BY MOUTH DAILY. (Patient taking differently: Take 37.5 mg by mouth daily. )  . omeprazole (PRILOSEC) 20 MG capsule TAKE 1 CAPSULE BY MOUTH ONCE DAILY. (Patient taking differently: Take 20 mg by mouth daily. )  . tamsulosin (FLOMAX) 0.4 MG CAPS capsule Take 1 capsule (0.4 mg total) by mouth daily after supper. (Patient taking differently: Take 0.4 mg by mouth daily. )  . ULORIC 40 MG tablet TAKE 1 TABLET BY MOUTH ONCE A DAY. (Patient taking differently: Take 40 mg by mouth daily. )  . famotidine (PEPCID) 20 MG tablet Take 1 tablet (20 mg total) by mouth daily for 7 days.   No facility-administered encounter medications on file as of 05/16/2019.     ALLERGIES:  Allergies  Allergen Reactions  . Aspirin Other (See Comments)    REACTION: Diverticular Bleed     PHYSICAL EXAM:  ECOG Performance status: 1  Vitals:   05/16/19 1100  BP: (!) 145/82  Pulse: 70  Resp: 16  Temp: (!) 97.3 F (36.3 C)  SpO2: 100%   Filed Weights   05/16/19 1100  Weight: 239 lb 9 oz (108.7 kg)    Physical Exam Constitutional:      Appearance: He is obese.  HENT:     Head: Normocephalic.     Right Ear: External ear normal.     Left Ear: External ear normal.     Nose: Nose normal.     Mouth/Throat:     Mouth: Mucous membranes are moist.  Eyes:     Conjunctiva/sclera: Conjunctivae normal.  Neck:     Musculoskeletal: Normal range of motion.  Cardiovascular:     Rate and Rhythm: Normal rate and regular rhythm.     Pulses: Normal pulses.     Heart sounds: Normal heart sounds.  Pulmonary:  Effort: Pulmonary effort is normal.     Breath sounds: Normal breath sounds.  Abdominal:     General: Bowel sounds are normal.  Musculoskeletal: Normal range of motion.  Skin:    General: Skin is warm and dry.  Neurological:     General: No focal deficit present.     Mental Status: He is alert.  Psychiatric:        Mood and Affect: Mood normal.        Behavior: Behavior normal.         Thought Content: Thought content normal.        Judgment: Judgment normal.      LABORATORY DATA:  I have reviewed the labs as listed.  CBC    Component Value Date/Time   WBC 15.2 (H) 04/24/2019 1353   RBC 3.92 (L) 04/24/2019 1353   HGB 12.2 (L) 04/24/2019 1353   HCT 38.0 (L) 04/24/2019 1353   HCT 44 01/05/2013   PLT 151 04/24/2019 1353   PLT 148 01/05/2013   MCV 96.9 04/24/2019 1353   MCV 90.4 01/05/2013   MCH 31.1 04/24/2019 1353   MCHC 32.1 04/24/2019 1353   RDW 14.0 04/24/2019 1353   LYMPHSABS 1.0 04/24/2019 1353   MONOABS 1.5 (H) 04/24/2019 1353   EOSABS 0.1 04/24/2019 1353   BASOSABS 0.0 04/24/2019 1353   CMP Latest Ref Rng & Units 04/24/2019 04/24/2019 03/07/2019  Glucose 70 - 99 mg/dL 111(H) 117(H) 102(H)  BUN 8 - 23 mg/dL 22 20 11   Creatinine 0.61 - 1.24 mg/dL 1.37(H) 1.36(H) 1.13  Sodium 135 - 145 mmol/L 135 137 141  Potassium 3.5 - 5.1 mmol/L 3.8 3.7 3.8  Chloride 98 - 111 mmol/L 98 99 99  CO2 22 - 32 mmol/L 27 25 25   Calcium 8.9 - 10.3 mg/dL 9.3 9.4 9.5  Total Protein 6.5 - 8.1 g/dL 8.4(H) 8.5(H) -  Total Bilirubin 0.3 - 1.2 mg/dL 1.9(H) 2.1(H) -  Alkaline Phos 38 - 126 U/L 71 73 -  AST 15 - 41 U/L 21 21 -  ALT 0 - 44 U/L 13 16 -       ASSESSMENT & PLAN:   Neuroendocrine tumor 1.  Well-differentiated neuroendocrine carcinoid tumor  - Polypectomy by Dr. Oneida Alar on 08/14/2014 and 10/08/2015 with negative octreotide scan on 09/19/2014.   - His case was presented at GI tumor board in February/March 2016 with recommendation for anatomic biopsies to guide role of surgical resection.  - Anatomic biopsies in 03/2016 were negative. - His last two chromogranin A levels have been elevated, 186.0 in January 2020, in August level was 180.4.  Dr. Oneida Alar is also aware of elevated levels and has seen patient in consult and advised EGD.  Patient has refused. -CT abdomen pelvis was negative for disease recurrence.  -Patient has no signs and symptoms of carcinoid  syndrome.   -I have advised patient to follow-up again with Dr. Oneida Alar.   -His last dotatate scan was done in February 2020 and was negative for disease..  2.  Pulmonary nodule.  -  6 mm LUL pulmonary nodule on NET PET scan done 10/18/2018.   -CT chest from September 2020 revealed stable left lung nodule. - Recommend to repeat CT chest 1 year.   3.  Elevated LFTs.   - Likely secondary to alcohol abuse - Recent NET PET scan done 10/18/2018 showed no abnormal liver findings.         Orders placed this encounter:  Orders Placed This Encounter  Procedures  . CBC with Differential  . Comprehensive metabolic panel  . Vitamin B12  . Ferritin  . Iron and TIBC  . Folate  . [place] [phone]

## 2019-05-17 DIAGNOSIS — I10 Essential (primary) hypertension: Secondary | ICD-10-CM | POA: Diagnosis not present

## 2019-06-06 ENCOUNTER — Other Ambulatory Visit: Payer: Self-pay | Admitting: Orthopaedic Surgery

## 2019-06-14 ENCOUNTER — Telehealth: Payer: Self-pay | Admitting: Orthopaedic Surgery

## 2019-06-14 NOTE — Telephone Encounter (Signed)
Refill on Hydrocodone/Acetaminophen 5-325  Mgs.   56  Sig: One tablet by mouth every six hours as needed for pain. Must last 30 days  Patient uses [employer]

## 2019-06-15 MED ORDER — HYDROCODONE-ACETAMINOPHEN 5-325 MG PO TABS: ORAL_TABLET | ORAL | 0 refills | Status: DC

## 2019-06-21 DIAGNOSIS — E785 Hyperlipidemia, unspecified: Secondary | ICD-10-CM | POA: Diagnosis not present

## 2019-06-21 DIAGNOSIS — E039 Hypothyroidism, unspecified: Secondary | ICD-10-CM | POA: Diagnosis not present

## 2019-06-21 DIAGNOSIS — D519 Vitamin B12 deficiency anemia, unspecified: Secondary | ICD-10-CM | POA: Diagnosis not present

## 2019-06-21 DIAGNOSIS — D509 Iron deficiency anemia, unspecified: Secondary | ICD-10-CM | POA: Diagnosis not present

## 2019-06-21 DIAGNOSIS — I10 Essential (primary) hypertension: Secondary | ICD-10-CM | POA: Diagnosis not present

## 2019-06-29 DIAGNOSIS — D509 Iron deficiency anemia, unspecified: Secondary | ICD-10-CM | POA: Diagnosis not present

## 2019-06-29 DIAGNOSIS — E785 Hyperlipidemia, unspecified: Secondary | ICD-10-CM | POA: Diagnosis not present

## 2019-06-29 DIAGNOSIS — I10 Essential (primary) hypertension: Secondary | ICD-10-CM | POA: Diagnosis not present

## 2019-06-29 DIAGNOSIS — E039 Hypothyroidism, unspecified: Secondary | ICD-10-CM | POA: Diagnosis not present

## 2019-07-04 ENCOUNTER — Encounter: Payer: Self-pay | Admitting: Orthopaedic Surgery

## 2019-07-04 ENCOUNTER — Other Ambulatory Visit: Payer: Self-pay

## 2019-07-04 ENCOUNTER — Ambulatory Visit (INDEPENDENT_AMBULATORY_CARE_PROVIDER_SITE_OTHER): Payer: Medicare Other | Admitting: Orthopaedic Surgery

## 2019-07-04 VITALS — BP 141/88 | HR 87 | Ht 72.0 in | Wt 240.0 lb

## 2019-07-04 DIAGNOSIS — G894 Chronic pain syndrome: Secondary | ICD-10-CM | POA: Diagnosis not present

## 2019-07-04 DIAGNOSIS — M1A0410 Idiopathic chronic gout, right hand, without tophus (tophi): Secondary | ICD-10-CM | POA: Diagnosis not present

## 2019-07-04 NOTE — Progress Notes (Signed)
Patient [name], male DOB:01/12/1951, 68 y.o. XBD:[id number]  Chief Complaint  Patient presents with  . Gout    left wrist continues to swell, patient states same no changes     HPI  [name] is a 68 y.o. male who has chronic gout and pain of both wrists from the gout.  He is stable.  He has some pain related to degenerative changes and cold weather.  He has no new trauma, no redness, no numbness. He is taking his medicine.   Body mass index is 32.55 kg/m.  ROS  Review of Systems  HENT: Negative for congestion.   Respiratory: Negative for cough and shortness of breath.   Cardiovascular: Negative for chest pain and leg swelling.  Endocrine: Positive for cold intolerance.  Musculoskeletal: Positive for arthralgias and joint swelling.  Allergic/Immunologic: Positive for environmental allergies.  All other systems reviewed and are negative.   All other systems reviewed and are negative.  The following is a summary of the past history medically, past history surgically, known current medicines, social history and family history.  This information is gathered electronically by the computer from prior information and documentation.  I review this each visit and have found including this information at this point in the chart is beneficial and informative.    Past Medical History:  Diagnosis Date  . Anemia    FeDA: GASTRIC POLYPS, B12; TCS 2008 EGD 2009, 2008-HB 11.1 MCV 83.6 CR 1.22, 2009 FERRITIN 102-22  . B12 deficiency   . Carcinoid tumor determined by biopsy of stomach 09/25/2014  . Chronic knee pain   . Diverticulosis of colon    Lower GI bleed 2008  . Essential hypertension   . GERD (gastroesophageal reflux disease)   . Gout   . Hepatomegaly    Hepatic steatosis  . History of alcohol abuse   . History of septic arthritis   . Hypothyroidism   . PUD   . Substance abuse [facility])     Past Surgical History:  Procedure Laterality Date  . BIOPSY  08/14/2014   Procedure: BIOPSY;  Surgeon: Danie Binder, MD;  Location: [facility];  Service: Endoscopy;;  . BIOPSY  03/12/2015   Procedure: BIOPSY;  Surgeon: Danie Binder, MD;  Location: [facility];  Service: Endoscopy;;  . BIOPSY  04/21/2016   Procedure: BIOPSY;  Surgeon: Danie Binder, MD;  [facility];  Service: Endoscopy;;  bx's of antrum, body of stomach, fundus, and cardia  . CHOLECYSTECTOMY    . COLONOSCOPY  2008 [facility] DJ   LGIB 2o to TICS, prep good  . COLONOSCOPY WITH PROPOFOL N/A 08/14/2014   SLF: 1. Four large colon polyps removed. 2. The left colon is redundant 3. Moderate diverticulosis throughout teh entire examined colon  . COLONOSCOPY WITH PROPOFOL N/A 11/06/2014   SLF: 8 small polyps removed. One large pedunculated polyp removed from the ascending colon, tubulovillous and tubular adenomas. Next colonoscopy March 2019  . COLONOSCOPY WITH PROPOFOL N/A 01/04/2018   Procedure: COLONOSCOPY WITH PROPOFOL;  Surgeon: Danie Binder, MD;  [facility];  Service: Endoscopy;  Laterality: N/A;  10:00am  . ESOPHAGOGASTRODUODENOSCOPY  12/08/2007   STM:HDQQ gastric polyps seen in the cardia and body of the stomach/Normal esophagus without evidence of Barrett's, mass, erosion/Normal duodenal bulb and second portion of the duodenum. Benign bx.  . ESOPHAGOGASTRODUODENOSCOPY (EGD) WITH PROPOFOL N/A 08/14/2014   SLF: 1. Heme postive stools due to gastric and colon polyps 2. Multiple gastric   .  ESOPHAGOGASTRODUODENOSCOPY (EGD) WITH PROPOFOL N/A 03/12/2015   SLF: Multiple gastric nodules seen in gasric body/antrum. 2. Non-erosive gastritis ( inflammation) was found in the gastric antrum.   . ESOPHAGOGASTRODUODENOSCOPY (EGD) WITH PROPOFOL N/A 10/08/2015   Procedure: ESOPHAGOGASTRODUODENOSCOPY (EGD) WITH PROPOFOL;  Surgeon: Danie Binder, MD;  [facility];  Service: Endoscopy;  Laterality: N/A;  0945  . ESOPHAGOGASTRODUODENOSCOPY (EGD) WITH PROPOFOL N/A 04/21/2016   Procedure:  ESOPHAGOGASTRODUODENOSCOPY (EGD) WITH PROPOFOL;  Surgeon: Danie Binder, MD;  [facility];  Service: Endoscopy;  Laterality: N/A;  730   . HAND SURGERY Right    fracture repair with plates  . INGUINAL HERNIA REPAIR Left 11/09/2018   Procedure: HERNIA REPAIR INGUINAL ADULT WITH MESH;  Surgeon: Virl Cagey, MD;  Location: [facility];  Service: General;  Laterality: Left;  . JOINT REPLACEMENT Bilateral    knees  . POLYPECTOMY  08/14/2014   Procedure: POLYPECTOMY;  Surgeon: Danie Binder, MD;  Location: [facility];  Service: Endoscopy;;  . POLYPECTOMY N/A 11/06/2014   Procedure: POLYPECTOMY;  Surgeon: Danie Binder, MD;  Location: [facility];  Service: Endoscopy;  Laterality: N/A;  Transverse Colon x3, Ascending Colon x2, Descending Colon x3  . REPLACEMENT TOTAL KNEE BILATERAL    . UPPER GASTROINTESTINAL ENDOSCOPY  APR 2009   INFLAMED HYPERPLASTIC POLYPS, CHRONIC GASTRITIS    Family History  Problem Relation Age of Onset  . Diabetes Mother   . Renal Disease Mother        failure  . Diabetes Father        'old age'  . Hypertension Sister   . Hypertension Brother   . Diabetes Brother   . Diabetes Sister   . Diabetes Sister   . Diabetes Brother   . Hypertension Brother   . Diabetes Brother        right BKA  . Kidney failure Brother        on dialysis  . Kidney failure Brother        on dialysis  . Diabetes Brother        bilateral BKA  . Colon polyps Neg Hx   . Colon cancer Neg Hx   . Pancreatic disease Neg Hx     Social History Social History   Tobacco Use  . Smoking status: Former Smoker    Years: 0.00  . Smokeless tobacco: Never Used  . Tobacco comment: Quit x 10 years/ never smoked on regular basis  Substance Use Topics  . Alcohol use: Yes    Alcohol/week: 2.0 standard drinks    Types: 2 Shots of liquor per week    Comment: drinks on weekends, gin/vodka 1/5th.  . Drug use: No    Allergies  Allergen Reactions  . Aspirin Other (See Comments)     REACTION: Diverticular Bleed    Current Outpatient Medications  Medication Sig Dispense Refill  . amLODipine (NORVASC) 5 MG tablet TAKE 1 TABLET BY MOUTH ONCE DAILY. 90 tablet 3  . atorvastatin (LIPITOR) 20 MG tablet Take 1 tablet (20 mg total) by mouth daily. 90 tablet 3  . clindamycin (CLINDAGEL) 1 % gel Apply 1 application topically daily. Applies to back of neck    . colchicine 0.6 MG tablet TAKE 1 TABLET BY MOUTH 3 TIMES DAILY X5 DAYS FOR GOUT PAIN. 15 tablet 3  . doxycycline (VIBRAMYCIN) 100 MG capsule Take 1 capsule (100 mg total) by mouth 2 (two) times daily. 20 capsule 0  . HYDROcodone-acetaminophen (NORCO/VICODIN) 5-325 MG  tablet One tablet by mouth every six hours as needed for pain.  Must last 30 days. 56 tablet 0  . iron polysaccharides (NIFEREX) 150 MG capsule Take 1 capsule (150 mg total) by mouth daily. 90 capsule 1  . levothyroxine (SYNTHROID, LEVOTHROID) 200 MCG tablet Take 1 tablet (200 mcg total) by mouth daily before breakfast. 90 tablet 3  . metoprolol succinate (TOPROL-XL) 25 MG 24 hr tablet TAKE 1 & 1/2 TABLETS BY MOUTH DAILY. (Patient taking differently: Take 37.5 mg by mouth daily. ) 45 tablet 9  . omeprazole (PRILOSEC) 20 MG capsule TAKE 1 CAPSULE BY MOUTH ONCE DAILY. (Patient taking differently: Take 20 mg by mouth daily. ) 30 capsule 3  . tamsulosin (FLOMAX) 0.4 MG CAPS capsule Take 1 capsule (0.4 mg total) by mouth daily after supper. (Patient taking differently: Take 0.4 mg by mouth daily. ) 30 capsule 3  . ULORIC 40 MG tablet TAKE 1 TABLET BY MOUTH ONCE A DAY. (Patient taking differently: Take 40 mg by mouth daily. ) 90 tablet 0  . famotidine (PEPCID) 20 MG tablet Take 1 tablet (20 mg total) by mouth daily for 7 days. 7 tablet 0   No current facility-administered medications for this visit.      Physical Exam  Blood pressure (!) 141/88, pulse 87, height 6' (1.829 m), weight 240 lb (108.9 kg).  Constitutional: overall normal hygiene, normal nutrition, well  developed, normal grooming, normal body habitus. Assistive device:cane  Musculoskeletal: gait and station Limp right, muscle tone and strength are normal, no tremors or atrophy is present.  .  Neurological: coordination overall normal.  Deep tendon reflex/nerve stretch intact.  Sensation normal.  Cranial nerves II-XII intact.   Skin:   Normal overall no scars, lesions, ulcers or rashes. No psoriasis.  Psychiatric: Alert and oriented x 3.  Recent memory intact, remote memory unclear.  Normal mood and affect. Well groomed.  Good eye contact.  Cardiovascular: overall no swelling, no varicosities, no edema bilaterally, normal temperatures of the legs and arms, no clubbing, cyanosis and good capillary refill.  Lymphatic: palpation is normal.  Both wrists have some swelling and tenderness but no pain and ROM is good.  NV intact.  All other systems reviewed and are negative   The patient has been educated about the nature of the problem(s) and counseled on treatment options.  The patient appeared to understand what I have discussed and is in agreement with it.  Encounter Diagnoses  Name Primary?  . 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 Chronic pain disorder     PLAN Call if any problems.  Precautions discussed.  Continue current medications.   Return to clinic 3 months   Electronically Signed Sanjuana Kava, MD 11/3/20201:58 PM

## 2019-07-11 ENCOUNTER — Ambulatory Visit: Payer: Medicare Other | Admitting: Orthopaedic Surgery

## 2019-07-13 ENCOUNTER — Telehealth: Payer: Self-pay | Admitting: Orthopaedic Surgery

## 2019-07-13 MED ORDER — HYDROCODONE-ACETAMINOPHEN 5-325 MG PO TABS: ORAL_TABLET | ORAL | 0 refills | Status: DC

## 2019-07-13 NOTE — Addendum Note (Signed)
Addended by: Willette Pa on: 07/13/2019 10:51 AM   Modules accepted: Orders

## 2019-07-13 NOTE — Telephone Encounter (Signed)
Patient requests refill,  HYDROcodone-acetaminophen (NORCO/VICODIN) 5-325 MG tablet 56 tablet  -[employer]

## 2019-08-10 ENCOUNTER — Other Ambulatory Visit: Payer: Self-pay | Admitting: Orthopedic Surgery

## 2019-08-10 MED ORDER — HYDROCODONE-ACETAMINOPHEN 5-325 MG PO TABS: ORAL_TABLET | ORAL | 0 refills | Status: DC

## 2019-08-10 NOTE — Telephone Encounter (Signed)
Can you advise since Dr Luna Glasgow is out of the office?

## 2019-08-10 NOTE — Telephone Encounter (Signed)
Patient of Dr. Brooke Bonito requests refill on Hydrocodone/Acetaminophen 5-325  Mgs.   56  Sig: One tablet by mouth every six hours as needed for pain. Must last 30 days  Patient uses [employer]

## 2019-08-30 ENCOUNTER — Other Ambulatory Visit (HOSPITAL_COMMUNITY): Payer: Self-pay | Admitting: *Deleted

## 2019-08-30 MED ORDER — POLYSACCHARIDE IRON COMPLEX 150 MG PO CAPS: 150.0000 mg | ORAL_CAPSULE | Freq: Every day | ORAL | 1 refills | Status: DC

## 2019-08-30 MED FILL — FERREX 150 CAPSULE: 150 | 90 days supply | Qty: 90 | Fill #0

## 2019-09-11 ENCOUNTER — Telehealth: Payer: Self-pay | Admitting: Orthopaedic Surgery

## 2019-09-11 MED ORDER — HYDROCODONE-ACETAMINOPHEN 5-325 MG PO TABS: ORAL_TABLET | ORAL | 0 refills | Status: DC

## 2019-09-11 NOTE — Telephone Encounter (Signed)
Patient called for refill: HYDROcodone-acetaminophen (NORCO/VICODIN) 5-325 MG tablet 56 tablet  -[employer]

## 2019-09-12 ENCOUNTER — Other Ambulatory Visit: Payer: Self-pay | Admitting: Orthopaedic Surgery

## 2019-09-28 DIAGNOSIS — E039 Hypothyroidism, unspecified: Secondary | ICD-10-CM | POA: Diagnosis not present

## 2019-09-28 DIAGNOSIS — E785 Hyperlipidemia, unspecified: Secondary | ICD-10-CM | POA: Diagnosis not present

## 2019-09-28 DIAGNOSIS — I10 Essential (primary) hypertension: Secondary | ICD-10-CM | POA: Diagnosis not present

## 2019-10-03 ENCOUNTER — Ambulatory Visit (INDEPENDENT_AMBULATORY_CARE_PROVIDER_SITE_OTHER): Payer: Medicare Other | Admitting: Orthopaedic Surgery

## 2019-10-03 ENCOUNTER — Other Ambulatory Visit: Payer: Self-pay

## 2019-10-03 ENCOUNTER — Encounter: Payer: Self-pay | Admitting: Orthopaedic Surgery

## 2019-10-03 VITALS — BP 130/79 | HR 88 | Temp 97.2°F | Ht 72.0 in | Wt 240.0 lb

## 2019-10-03 DIAGNOSIS — M1A0410 Idiopathic chronic gout, right hand, without tophus (tophi): Secondary | ICD-10-CM | POA: Diagnosis not present

## 2019-10-03 DIAGNOSIS — G894 Chronic pain syndrome: Secondary | ICD-10-CM | POA: Diagnosis not present

## 2019-10-03 NOTE — Progress Notes (Signed)
Patient YK:[id number] Lie, male DOB:05/27/1951, 69 y.o. ASN:[id number]  Chief Complaint  Patient presents with  . Gout    Gout    HPI  [name] is a 69 y.o. male who has chronic gout and it affects his hands and wrists.  He is taking his medicine.  I had called it in recently.  He has no new attacks.  He has no new trauma.  Cold weather makes it worse.  We have had cold weather.   Body mass index is 32.55 kg/m.  ROS  Review of Systems  HENT: Negative for congestion.   Respiratory: Negative for cough and shortness of breath.   Cardiovascular: Negative for chest pain and leg swelling.  Endocrine: Positive for cold intolerance.  Musculoskeletal: Positive for arthralgias and joint swelling.  Allergic/Immunologic: Positive for environmental allergies.  All other systems reviewed and are negative.   All other systems reviewed and are negative.  The following is a summary of the past history medically, past history surgically, known current medicines, social history and family history.  This information is gathered electronically by the computer from prior information and documentation.  I review this each visit and have found including this information at this point in the chart is beneficial and informative.    Past Medical History:  Diagnosis Date  . Anemia    FeDA: GASTRIC POLYPS, B12; TCS 2008 EGD 2009, 2008-HB 11.1 MCV 83.6 CR 1.22, 2009 FERRITIN 102-22  . B12 deficiency   . Carcinoid tumor determined by biopsy of stomach 09/25/2014  . Chronic knee pain   . Diverticulosis of colon    Lower GI bleed 2008  . Essential hypertension   . GERD (gastroesophageal reflux disease)   . Gout   . Hepatomegaly    Hepatic steatosis  . History of alcohol abuse   . History of septic arthritis   . Hypothyroidism   . PUD   . Substance abuse [facility])     Past Surgical History:  Procedure Laterality Date  . BIOPSY  08/14/2014   Procedure: BIOPSY;  Surgeon: Danie Binder, MD;  [facility];  Service: Endoscopy;;  . BIOPSY  03/12/2015   Procedure: BIOPSY;  Surgeon: Danie Binder, MD;  Location: [facility];  Service: Endoscopy;;  . BIOPSY  04/21/2016   Procedure: BIOPSY;  Surgeon: Danie Binder, MD;  [facility];  Service: Endoscopy;;  bx's of antrum, body of stomach, fundus, and cardia  . CHOLECYSTECTOMY    . COLONOSCOPY  2008 [facility] DJ   LGIB 2o to TICS, prep good  . COLONOSCOPY WITH PROPOFOL N/A 08/14/2014   SLF: 1. Four large colon polyps removed. 2. The left colon is redundant 3. Moderate diverticulosis throughout teh entire examined colon  . COLONOSCOPY WITH PROPOFOL N/A 11/06/2014   SLF: 8 small polyps removed. One large pedunculated polyp removed from the ascending colon, tubulovillous and tubular adenomas. Next colonoscopy March 2019  . COLONOSCOPY WITH PROPOFOL N/A 01/04/2018   Procedure: COLONOSCOPY WITH PROPOFOL;  Surgeon: Danie Binder, MD;  [facility];  Service: Endoscopy;  Laterality: N/A;  10:00am  . ESOPHAGOGASTRODUODENOSCOPY  12/08/2007   LPF:XTKW gastric polyps seen in the cardia and body of the stomach/Normal esophagus without evidence of Barrett's, mass, erosion/Normal duodenal bulb and second portion of the duodenum. Benign bx.  . ESOPHAGOGASTRODUODENOSCOPY (EGD) WITH PROPOFOL N/A 08/14/2014   SLF: 1. Heme postive stools due to gastric and colon polyps 2. Multiple gastric   . ESOPHAGOGASTRODUODENOSCOPY (EGD) WITH  PROPOFOL N/A 03/12/2015   SLF: Multiple gastric nodules seen in gasric body/antrum. 2. Non-erosive gastritis ( inflammation) was found in the gastric antrum.   . ESOPHAGOGASTRODUODENOSCOPY (EGD) WITH PROPOFOL N/A 10/08/2015   Procedure: ESOPHAGOGASTRODUODENOSCOPY (EGD) WITH PROPOFOL;  Surgeon: Danie Binder, MD;  [facility];  Service: Endoscopy;  Laterality: N/A;  0945  . ESOPHAGOGASTRODUODENOSCOPY (EGD) WITH PROPOFOL N/A 04/21/2016   Procedure: ESOPHAGOGASTRODUODENOSCOPY (EGD) WITH PROPOFOL;  Surgeon: Danie Binder,  MD;  [facility];  Service: Endoscopy;  Laterality: N/A;  730   . HAND SURGERY Right    fracture repair with plates  . INGUINAL HERNIA REPAIR Left 11/09/2018   Procedure: HERNIA REPAIR INGUINAL ADULT WITH MESH;  Surgeon: Virl Cagey, MD;  Location: [facility];  Service: General;  Laterality: Left;  . JOINT REPLACEMENT Bilateral    knees  . POLYPECTOMY  08/14/2014   Procedure: POLYPECTOMY;  Surgeon: Danie Binder, MD;  [facility]: [facility];  Service: Endoscopy;;  . POLYPECTOMY N/A 11/06/2014   Procedure: POLYPECTOMY;  Surgeon: Danie Binder, MD;  [facility];  Service: Endoscopy;  Laterality: N/A;  Transverse Colon x3, Ascending Colon x2, Descending Colon x3  . REPLACEMENT TOTAL KNEE BILATERAL    . UPPER GASTROINTESTINAL ENDOSCOPY  APR 2009   INFLAMED HYPERPLASTIC POLYPS, CHRONIC GASTRITIS    Family History  Problem Relation Age of Onset  . Diabetes Mother   . Renal Disease Mother        failure  . Diabetes Father        'old age'  . Hypertension Sister   . Hypertension Brother   . Diabetes Brother   . Diabetes Sister   . Diabetes Sister   . Diabetes Brother   . Hypertension Brother   . Diabetes Brother        right BKA  . Kidney failure Brother        on dialysis  . Kidney failure Brother        on dialysis  . Diabetes Brother        bilateral BKA  . Colon polyps Neg Hx   . Colon cancer Neg Hx   . Pancreatic disease Neg Hx     Social History Social History   Tobacco Use  . Smoking status: Former Smoker    Years: 0.00  . Smokeless tobacco: Never Used  . Tobacco comment: Quit x 10 years/ never smoked on regular basis  Substance Use Topics  . Alcohol use: Yes    Alcohol/week: 2.0 standard drinks    Types: 2 Shots of liquor per week    Comment: drinks on weekends, gin/vodka 1/5th.  . Drug use: No    Allergies  Allergen Reactions  . Aspirin Other (See Comments)    REACTION: Diverticular Bleed    Current Outpatient Medications  Medication  Sig Dispense Refill  . amLODipine (NORVASC) 5 MG tablet TAKE 1 TABLET BY MOUTH ONCE DAILY. 90 tablet 3  . atorvastatin (LIPITOR) 20 MG tablet Take 1 tablet (20 mg total) by mouth daily. 90 tablet 3  . clindamycin (CLINDAGEL) 1 % gel Apply 1 application topically daily. Applies to back of neck    . colchicine 0.6 MG tablet TAKE 1 TABLET BY MOUTH 3 TIMES DAILY X5 DAYS FOR GOUT PAIN. 15 tablet 0  . doxycycline (VIBRAMYCIN) 100 MG capsule Take 1 capsule (100 mg total) by mouth 2 (two) times daily. 20 capsule 0  . famotidine (PEPCID) 20 MG tablet Take 1  tablet (20 mg total) by mouth daily for 7 days. 7 tablet 0  . HYDROcodone-acetaminophen (NORCO/VICODIN) 5-325 MG tablet One tablet by mouth every six hours as needed for pain.  Must last 30 days. 56 tablet 0  . iron polysaccharides (NIFEREX) 150 MG capsule Take 1 capsule (150 mg total) by mouth daily. 90 capsule 1  . levothyroxine (SYNTHROID, LEVOTHROID) 200 MCG tablet Take 1 tablet (200 mcg total) by mouth daily before breakfast. 90 tablet 3  . metoprolol succinate (TOPROL-XL) 25 MG 24 hr tablet TAKE 1 & 1/2 TABLETS BY MOUTH DAILY. (Patient taking differently: Take 37.5 mg by mouth daily. ) 45 tablet 9  . omeprazole (PRILOSEC) 20 MG capsule TAKE 1 CAPSULE BY MOUTH ONCE DAILY. (Patient taking differently: Take 20 mg by mouth daily. ) 30 capsule 3  . tamsulosin (FLOMAX) 0.4 MG CAPS capsule Take 1 capsule (0.4 mg total) by mouth daily after supper. (Patient taking differently: Take 0.4 mg by mouth daily. ) 30 capsule 3  . ULORIC 40 MG tablet TAKE 1 TABLET BY MOUTH ONCE A DAY. (Patient taking differently: Take 40 mg by mouth daily. ) 90 tablet 0   No current facility-administered medications for this visit.     Physical Exam  Blood pressure 130/79, pulse 88, temperature (!) 97.2 F (36.2 C), height 6' (1.829 m), weight 240 lb (108.9 kg).  Constitutional: overall normal hygiene, normal nutrition, well developed, normal grooming, normal body  habitus. Assistive device:none  Musculoskeletal: gait and station Limp none, muscle tone and strength are normal, no tremors or atrophy is present.  .  Neurological: coordination overall normal.  Deep tendon reflex/nerve stretch intact.  Sensation normal.  Cranial nerves II-XII intact.   Skin:   Normal overall no scars, lesions, ulcers or rashes. No psoriasis.  Psychiatric: Alert and oriented x 3.  Recent memory intact, remote memory unclear.  Normal mood and affect. Well groomed.  Good eye contact.  Cardiovascular: overall no swelling, no varicosities, no edema bilaterally, normal temperatures of the legs and arms, no clubbing, cyanosis and good capillary refill.  Lymphatic: palpation is normal.  All other systems reviewed and are negative   The patient has been educated about the nature of the problem(s) and counseled on treatment options.  The patient appeared to understand what I have discussed and is in agreement with it.  Encounter Diagnoses  Name Primary?  . 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 Chronic pain disorder     PLAN Call if any problems.  Precautions discussed.  Continue current medications.   Return to clinic 3 months   Electronically Signed Sanjuana Kava, MD 2/2/202110:21 AM

## 2019-10-04 DIAGNOSIS — I10 Essential (primary) hypertension: Secondary | ICD-10-CM | POA: Diagnosis not present

## 2019-10-04 DIAGNOSIS — E039 Hypothyroidism, unspecified: Secondary | ICD-10-CM | POA: Diagnosis not present

## 2019-10-04 DIAGNOSIS — D509 Iron deficiency anemia, unspecified: Secondary | ICD-10-CM | POA: Diagnosis not present

## 2019-10-04 DIAGNOSIS — E785 Hyperlipidemia, unspecified: Secondary | ICD-10-CM | POA: Diagnosis not present

## 2019-10-11 ENCOUNTER — Telehealth: Payer: Self-pay | Admitting: Orthopaedic Surgery

## 2019-10-11 MED ORDER — HYDROCODONE-ACETAMINOPHEN 5-325 MG PO TABS: ORAL_TABLET | ORAL | 0 refills | Status: DC

## 2019-10-11 NOTE — Telephone Encounter (Signed)
Patient requests refill on Hydrocodone/Acetaminophen 5-325  Mgs.  Qty  27  Sig: One tablet by mouth every six hours as needed for pain. Must last 30 days.  Patient states he uses [employer]

## 2019-10-12 ENCOUNTER — Other Ambulatory Visit: Payer: Self-pay | Admitting: Orthopaedic Surgery

## 2019-11-06 ENCOUNTER — Other Ambulatory Visit (HOSPITAL_COMMUNITY): Payer: Self-pay | Admitting: *Deleted

## 2019-11-06 DIAGNOSIS — D3A8 Other benign neuroendocrine tumors: Secondary | ICD-10-CM

## 2019-11-06 DIAGNOSIS — D500 Iron deficiency anemia secondary to blood loss (chronic): Secondary | ICD-10-CM

## 2019-11-07 ENCOUNTER — Inpatient Hospital Stay (HOSPITAL_COMMUNITY): Payer: Medicare Other | Attending: Hematology

## 2019-11-07 DIAGNOSIS — Z87891 Personal history of nicotine dependence: Secondary | ICD-10-CM | POA: Insufficient documentation

## 2019-11-07 DIAGNOSIS — R7989 Other specified abnormal findings of blood chemistry: Secondary | ICD-10-CM | POA: Insufficient documentation

## 2019-11-07 DIAGNOSIS — R911 Solitary pulmonary nodule: Secondary | ICD-10-CM | POA: Insufficient documentation

## 2019-11-07 DIAGNOSIS — C7A092 Malignant carcinoid tumor of the stomach: Secondary | ICD-10-CM | POA: Insufficient documentation

## 2019-11-07 DIAGNOSIS — E039 Hypothyroidism, unspecified: Secondary | ICD-10-CM | POA: Insufficient documentation

## 2019-11-07 DIAGNOSIS — I10 Essential (primary) hypertension: Secondary | ICD-10-CM | POA: Insufficient documentation

## 2019-11-07 DIAGNOSIS — Z79899 Other long term (current) drug therapy: Secondary | ICD-10-CM | POA: Insufficient documentation

## 2019-11-13 ENCOUNTER — Encounter: Payer: Self-pay | Admitting: Gastroenterology

## 2019-11-14 ENCOUNTER — Ambulatory Visit (HOSPITAL_COMMUNITY): Payer: Medicare Other | Admitting: Nurse Practitioner

## 2019-11-14 ENCOUNTER — Telehealth: Payer: Self-pay | Admitting: Orthopaedic Surgery

## 2019-11-14 MED ORDER — HYDROCODONE-ACETAMINOPHEN 5-325 MG PO TABS: ORAL_TABLET | ORAL | 0 refills | Status: DC

## 2019-11-14 NOTE — Telephone Encounter (Signed)
Patient called for refill: HYDROcodone-acetaminophen (NORCO/VICODIN) 5-325 MG tablet 56 tablet  -[employer]

## 2019-11-17 ENCOUNTER — Other Ambulatory Visit: Payer: Self-pay

## 2019-11-17 ENCOUNTER — Inpatient Hospital Stay (HOSPITAL_COMMUNITY): Payer: Medicare Other | Attending: Hematology

## 2019-11-17 DIAGNOSIS — D3A8 Other benign neuroendocrine tumors: Secondary | ICD-10-CM

## 2019-11-17 DIAGNOSIS — C7A092 Malignant carcinoid tumor of the stomach: Secondary | ICD-10-CM | POA: Diagnosis not present

## 2019-11-17 DIAGNOSIS — D500 Iron deficiency anemia secondary to blood loss (chronic): Secondary | ICD-10-CM

## 2019-11-17 LAB — CBC WITH DIFFERENTIAL/PLATELET
Abs Immature Granulocytes: 0.01 10*3/uL (ref 0.00–0.07)
Basophils Absolute: 0 10*3/uL (ref 0.0–0.1)
Basophils Relative: 1 %
Eosinophils Absolute: 0.4 10*3/uL (ref 0.0–0.5)
Eosinophils Relative: 6 %
HCT: 41.2 % (ref 39.0–52.0)
Hemoglobin: 13 g/dL (ref 13.0–17.0)
Immature Granulocytes: 0 %
Lymphocytes Relative: 42 %
Lymphs Abs: 2.8 10*3/uL (ref 0.7–4.0)
MCH: 31.9 pg (ref 26.0–34.0)
MCHC: 31.6 g/dL (ref 30.0–36.0)
MCV: 101.2 fL — ABNORMAL HIGH (ref 80.0–100.0)
Monocytes Absolute: 0.8 10*3/uL (ref 0.1–1.0)
Monocytes Relative: 12 %
Neutro Abs: 2.6 10*3/uL (ref 1.7–7.7)
Neutrophils Relative %: 39 %
Platelets: 143 10*3/uL — ABNORMAL LOW (ref 150–400)
RBC: 4.07 MIL/uL — ABNORMAL LOW (ref 4.22–5.81)
RDW: 14.4 % (ref 11.5–15.5)
WBC: 6.7 10*3/uL (ref 4.0–10.5)
nRBC: 0 % (ref 0.0–0.2)

## 2019-11-17 LAB — COMPREHENSIVE METABOLIC PANEL
ALT: 27 U/L (ref 0–44)
AST: 37 U/L (ref 15–41)
Albumin: 3.8 g/dL (ref 3.5–5.0)
Alkaline Phosphatase: 44 U/L (ref 38–126)
Anion gap: 9 (ref 5–15)
BUN: 13 mg/dL (ref 8–23)
CO2: 30 mmol/L (ref 22–32)
Calcium: 9.2 mg/dL (ref 8.9–10.3)
Chloride: 101 mmol/L (ref 98–111)
Creatinine, Ser: 1.11 mg/dL (ref 0.61–1.24)
GFR calc Af Amer: >60 mL/min (ref >60)
GFR calc non Af Amer: >60 mL/min (ref >60)
Glucose, Bld: 110 mg/dL — ABNORMAL HIGH (ref 70–99)
Potassium: 3.5 mmol/L (ref 3.5–5.1)
Sodium: 140 mmol/L (ref 135–145)
Total Bilirubin: 0.8 mg/dL (ref 0.3–1.2)
Total Protein: 7.4 g/dL (ref 6.5–8.1)

## 2019-11-17 LAB — FOLATE: Folate: 8 ng/mL (ref >5.9)

## 2019-11-17 LAB — VITAMIN B12: Vitamin B-12: 256 pg/mL (ref 180–914)

## 2019-11-17 LAB — IRON AND TIBC
Iron: 74 ug/dL (ref 45–182)
Saturation Ratios: 27 % (ref 17.9–39.5)
TIBC: 271 ug/dL (ref 250–450)
UIBC: 197 ug/dL

## 2019-11-17 LAB — FERRITIN: Ferritin: 99 ng/mL (ref 24–336)

## 2019-11-21 ENCOUNTER — Other Ambulatory Visit: Payer: Self-pay | Admitting: Orthopaedic Surgery

## 2019-11-21 LAB — CHROMOGRANIN A: Chromogranin A (ng/mL): 149.5 ng/mL — ABNORMAL HIGH (ref 0.0–101.8)

## 2019-11-24 ENCOUNTER — Other Ambulatory Visit: Payer: Self-pay

## 2019-11-24 ENCOUNTER — Inpatient Hospital Stay (HOSPITAL_BASED_OUTPATIENT_CLINIC_OR_DEPARTMENT_OTHER): Payer: Medicare Other | Admitting: Nurse Practitioner

## 2019-11-24 VITALS — BP 127/71 | HR 84 | Temp 96.9°F | Resp 17 | Wt 238.3 lb

## 2019-11-24 DIAGNOSIS — E039 Hypothyroidism, unspecified: Secondary | ICD-10-CM | POA: Diagnosis not present

## 2019-11-24 DIAGNOSIS — D3A8 Other benign neuroendocrine tumors: Secondary | ICD-10-CM | POA: Diagnosis not present

## 2019-11-24 DIAGNOSIS — R918 Other nonspecific abnormal finding of lung field: Secondary | ICD-10-CM

## 2019-11-24 DIAGNOSIS — C7A092 Malignant carcinoid tumor of the stomach: Secondary | ICD-10-CM | POA: Diagnosis not present

## 2019-11-24 DIAGNOSIS — Z87891 Personal history of nicotine dependence: Secondary | ICD-10-CM | POA: Diagnosis not present

## 2019-11-24 DIAGNOSIS — R911 Solitary pulmonary nodule: Secondary | ICD-10-CM | POA: Diagnosis not present

## 2019-11-24 DIAGNOSIS — Z79899 Other long term (current) drug therapy: Secondary | ICD-10-CM | POA: Diagnosis not present

## 2019-11-24 DIAGNOSIS — I10 Essential (primary) hypertension: Secondary | ICD-10-CM | POA: Diagnosis not present

## 2019-11-24 DIAGNOSIS — R7989 Other specified abnormal findings of blood chemistry: Secondary | ICD-10-CM | POA: Diagnosis not present

## 2019-11-24 NOTE — Progress Notes (Signed)
[place], [place] [address]   CLINIC:  Medical Oncology/Hematology  PCP:  [name], MD [place] [id number] [phone]   REASON FOR VISIT: Follow-up for neuroendocrine tumor  CURRENT THERAPY: Observation   INTERVAL HISTORY:  Mr. Granada 69 y.o. male returns for routine follow-up for neuroendocrine tumor.  Patient reports he has been doing well since his last visit.  He denies any new abdominal pain. Denies any nausea, vomiting, or diarrhea. Denies any new pains. Had not noticed any recent bleeding such as epistaxis, hematuria or hematochezia. Denies recent chest pain on exertion, shortness of breath on minimal exertion, pre-syncopal episodes, or palpitations. Denies any numbness or tingling in hands or feet. Denies any recent fevers, infections, or recent hospitalizations. Patient reports appetite at 100% and energy level at 100%.  He is eating well maintain his weight at this time.   REVIEW OF SYSTEMS:  Review of Systems  All other systems reviewed and are negative.    PAST MEDICAL/SURGICAL HISTORY:  Past Medical History:  Diagnosis Date  . Anemia    FeDA: GASTRIC POLYPS, B12; TCS 2008 EGD 2009, 2008-HB 11.1 MCV 83.6 CR 1.22, 2009 FERRITIN 102-22  . B12 deficiency   . Carcinoid tumor determined by biopsy of stomach 09/25/2014  . Chronic knee pain   . Diverticulosis of colon    Lower GI bleed 2008  . Essential hypertension   . GERD (gastroesophageal reflux disease)   . Gout   . Hepatomegaly    Hepatic steatosis  . History of alcohol abuse   . History of septic arthritis   . Hypothyroidism   . PUD   . Substance abuse [facility])    Past Surgical History:  Procedure Laterality Date  . BIOPSY  08/14/2014   Procedure: BIOPSY;  Surgeon: Danie Binder, MD;  [facility];  Service: Endoscopy;;  . BIOPSY  03/12/2015   Procedure: BIOPSY;  Surgeon: Danie Binder, MD;  Location: [facility];  Service:  Endoscopy;;  . BIOPSY  04/21/2016   Procedure: BIOPSY;  Surgeon: Danie Binder, MD;  [facility];  Service: Endoscopy;;  bx's of antrum, body of stomach, fundus, and cardia  . CHOLECYSTECTOMY    . COLONOSCOPY  2008 [facility] DJ   LGIB 2o to TICS, prep good  . COLONOSCOPY WITH PROPOFOL N/A 08/14/2014   SLF: 1. Four large colon polyps removed. 2. The left colon is redundant 3. Moderate diverticulosis throughout teh entire examined colon  . COLONOSCOPY WITH PROPOFOL N/A 11/06/2014   SLF: 8 small polyps removed. One large pedunculated polyp removed from the ascending colon, tubulovillous and tubular adenomas. Next colonoscopy March 2019  . COLONOSCOPY WITH PROPOFOL N/A 01/04/2018   Procedure: COLONOSCOPY WITH PROPOFOL;  Surgeon: Danie Binder, MD;  [facility];  Service: Endoscopy;  Laterality: N/A;  10:00am  . ESOPHAGOGASTRODUODENOSCOPY  12/08/2007   KGM:WNUU gastric polyps seen in the cardia and body of the stomach/Normal esophagus without evidence of Barrett's, mass, erosion/Normal duodenal bulb and second portion of the duodenum. Benign bx.  . ESOPHAGOGASTRODUODENOSCOPY (EGD) WITH PROPOFOL N/A 08/14/2014   SLF: 1. Heme postive stools due to gastric and colon polyps 2. Multiple gastric   . ESOPHAGOGASTRODUODENOSCOPY (EGD) WITH PROPOFOL N/A 03/12/2015   SLF: Multiple gastric nodules seen in gasric body/antrum. 2. Non-erosive gastritis ( inflammation) was found in the gastric antrum.   . ESOPHAGOGASTRODUODENOSCOPY (EGD) WITH PROPOFOL N/A 10/08/2015   Procedure: ESOPHAGOGASTRODUODENOSCOPY (  EGD) WITH PROPOFOL;  Surgeon: Danie Binder, MD;  [facility];  Service: Endoscopy;  Laterality: N/A;  0945  . ESOPHAGOGASTRODUODENOSCOPY (EGD) WITH PROPOFOL N/A 04/21/2016   Procedure: ESOPHAGOGASTRODUODENOSCOPY (EGD) WITH PROPOFOL;  Surgeon: Danie Binder, MD;  [facility];  Service: Endoscopy;  Laterality: N/A;  730   . HAND SURGERY Right    fracture repair with plates   . INGUINAL HERNIA REPAIR Left 11/09/2018   Procedure: HERNIA REPAIR INGUINAL ADULT WITH MESH;  Surgeon: Virl Cagey, MD;  Location: [facility];  Service: General;  Laterality: Left;  . JOINT REPLACEMENT Bilateral    knees  . POLYPECTOMY  08/14/2014   Procedure: POLYPECTOMY;  Surgeon: Danie Binder, MD;  Location: [facility];  Service: Endoscopy;;  . POLYPECTOMY N/A 11/06/2014   Procedure: POLYPECTOMY;  Surgeon: Danie Binder, MD;  Location: [facility];  Service: Endoscopy;  Laterality: N/A;  Transverse Colon x3, Ascending Colon x2, Descending Colon x3  . REPLACEMENT TOTAL KNEE BILATERAL    . UPPER GASTROINTESTINAL ENDOSCOPY  APR 2009   INFLAMED HYPERPLASTIC POLYPS, CHRONIC GASTRITIS     SOCIAL HISTORY:  Social History   Socioeconomic History  . Marital status: Single    Spouse name: Not on file  . Number of children: 0  . Years of education: 3  . Highest education level: Not on file  Occupational History  . Occupation: retired    Comment: farming  Tobacco Use  . Smoking status: Former Smoker    Years: 0.00  . Smokeless tobacco: Never Used  . Tobacco comment: Quit x 10 years/ never smoked on regular basis  Substance and Sexual Activity  . Alcohol use: Yes    Alcohol/week: 2.0 standard drinks    Types: 2 Shots of liquor per week    Comment: drinks on weekends, gin/vodka 1/5th.  . Drug use: No  . Sexual activity: Never  Other Topics Concern  . Not on file  Social History Narrative   HE DOES NOT HAVE ANY CHILDREN.   Lives alone   Does not drive   CAN NOT READ   Social Determinants of Health   Financial Resource Strain:   . Difficulty of Paying Living Expenses:   Food Insecurity:   . Worried About Charity fundraiser in the Last Year:   . Arboriculturist in the Last Year:   Transportation Needs:   . Film/video editor (Medical):   Marland Kitchen Lack of Transportation (Non-Medical):   Physical Activity:   . Days of Exercise per Week:   . Minutes of Exercise per Session:    Stress:   . Feeling of Stress :   Social Connections:   . Frequency of Communication with Friends and Family:   . Frequency of Social Gatherings with Friends and Family:   . Attends Religious Services:   . Active Member of Clubs or Organizations:   . Attends Archivist Meetings:   Marland Kitchen Marital Status:   Intimate Partner Violence:   . Fear of Current or Ex-Partner:   . Emotionally Abused:   Marland Kitchen Physically Abused:   . Sexually Abused:     FAMILY HISTORY:  Family History  Problem Relation Age of Onset  . Diabetes Mother   . Renal Disease Mother        failure  . Diabetes Father        'old age'  . Hypertension Sister   . Hypertension Brother   . Diabetes Brother   .  Diabetes Sister   . Diabetes Sister   . Diabetes Brother   . Hypertension Brother   . Diabetes Brother        right BKA  . Kidney failure Brother        on dialysis  . Kidney failure Brother        on dialysis  . Diabetes Brother        bilateral BKA  . Colon polyps Neg Hx   . Colon cancer Neg Hx   . Pancreatic disease Neg Hx     CURRENT MEDICATIONS:  Outpatient Encounter Medications as of 11/24/2019  Medication Sig  . amLODipine (NORVASC) 5 MG tablet TAKE 1 TABLET BY MOUTH ONCE DAILY.  Marland Kitchen atorvastatin (LIPITOR) 20 MG tablet Take 1 tablet (20 mg total) by mouth daily.  . colchicine 0.6 MG tablet TAKE 1 TABLET BY MOUTH 3 TIMES DAILY X5 DAYS FOR GOUT PAIN.  . iron polysaccharides (NIFEREX) 150 MG capsule Take 1 capsule (150 mg total) by mouth daily.  Marland Kitchen levothyroxine (SYNTHROID, LEVOTHROID) 200 MCG tablet Take 1 tablet (200 mcg total) by mouth daily before breakfast.  . metoprolol succinate (TOPROL-XL) 25 MG 24 hr tablet TAKE 1 & 1/2 TABLETS BY MOUTH DAILY. (Patient taking differently: Take 37.5 mg by mouth daily. )  . omeprazole (PRILOSEC) 20 MG capsule TAKE 1 CAPSULE BY MOUTH ONCE DAILY. (Patient taking differently: Take 20 mg by mouth daily. )  . tamsulosin (FLOMAX) 0.4 MG CAPS capsule Take 1 capsule  (0.4 mg total) by mouth daily after supper. (Patient taking differently: Take 0.4 mg by mouth daily. )  . ULORIC 40 MG tablet TAKE 1 TABLET BY MOUTH ONCE A DAY. (Patient taking differently: Take 40 mg by mouth daily. )  . clindamycin (CLINDAGEL) 1 % gel Apply 1 application topically daily. Applies to back of neck  . HYDROcodone-acetaminophen (NORCO/VICODIN) 5-325 MG tablet One tablet by mouth every six hours as needed for pain.  Must last 30 days. (Patient not taking: Reported on 11/24/2019)  . sildenafil (VIAGRA) 100 MG tablet Take 50 mg by mouth daily as needed.  . [DISCONTINUED] doxycycline (VIBRAMYCIN) 100 MG capsule Take 1 capsule (100 mg total) by mouth 2 (two) times daily.  . [DISCONTINUED] famotidine (PEPCID) 20 MG tablet Take 1 tablet (20 mg total) by mouth daily for 7 days.   No facility-administered encounter medications on file as of 11/24/2019.    ALLERGIES:  Allergies  Allergen Reactions  . Aspirin Other (See Comments)    REACTION: Diverticular Bleed     PHYSICAL EXAM:  ECOG Performance status: 1  Vitals:   11/24/19 1018  BP: 127/71  Pulse: 84  Resp: 17  Temp: (!) 96.9 F (36.1 C)  SpO2: 99%   Filed Weights   11/24/19 1018  Weight: 238 lb 4.8 oz (108.1 kg)    Physical Exam Constitutional:      Appearance: Normal appearance. He is normal weight.  Cardiovascular:     Rate and Rhythm: Normal rate and regular rhythm.     Heart sounds: Normal heart sounds.  Pulmonary:     Effort: Pulmonary effort is normal.     Breath sounds: Normal breath sounds.  Abdominal:     General: Bowel sounds are normal.     Palpations: Abdomen is soft.  Musculoskeletal:        General: Normal range of motion.  Skin:    General: Skin is warm.  Neurological:     Mental Status: He is alert and oriented  to person, place, and time. Mental status is at baseline.  Psychiatric:        Mood and Affect: Mood normal.        Behavior: Behavior normal.        Thought Content: Thought  content normal.        Judgment: Judgment normal.      LABORATORY DATA:  I have reviewed the labs as listed.  CBC    Component Value Date/Time   WBC 6.7 11/17/2019 1020   RBC 4.07 (L) 11/17/2019 1020   HGB 13.0 11/17/2019 1020   HCT 41.2 11/17/2019 1020   HCT 44 01/05/2013 0000   PLT 143 (L) 11/17/2019 1020   PLT 148 01/05/2013 0000   MCV 101.2 (H) 11/17/2019 1020   MCV 90.4 01/05/2013 0000   MCH 31.9 11/17/2019 1020   MCHC 31.6 11/17/2019 1020   RDW 14.4 11/17/2019 1020   LYMPHSABS 2.8 11/17/2019 1020   MONOABS 0.8 11/17/2019 1020   EOSABS 0.4 11/17/2019 1020   BASOSABS 0.0 11/17/2019 1020   CMP Latest Ref Rng & Units 11/17/2019 04/24/2019 04/24/2019  Glucose 70 - 99 mg/dL 110(H) 111(H) 117(H)  BUN 8 - 23 mg/dL 13 22 20   Creatinine 0.61 - 1.24 mg/dL 1.11 1.37(H) 1.36(H)  Sodium 135 - 145 mmol/L 140 135 137  Potassium 3.5 - 5.1 mmol/L 3.5 3.8 3.7  Chloride 98 - 111 mmol/L 101 98 99  CO2 22 - 32 mmol/L 30 27 25   Calcium 8.9 - 10.3 mg/dL 9.2 9.3 9.4  Total Protein 6.5 - 8.1 g/dL 7.4 8.4(H) 8.5(H)  Total Bilirubin 0.3 - 1.2 mg/dL 0.8 1.9(H) 2.1(H)  Alkaline Phos 38 - 126 U/L 44 71 73  AST 15 - 41 U/L 37 21 21  ALT 0 - 44 U/L 27 13 16     I personally performed a face-to-face visit,   All questions were answered to patient's stated satisfaction. Encouraged patient to call with any new concerns or questions before his next visit to the cancer center and we can certain see him sooner, if needed.     ASSESSMENT & PLAN:   Neuroendocrine tumor 1. Well- differentiated neuroendocrine carcinoid tumor: -Polypectomy by Dr. Oneida Alar on 08/14/2014 and 10/08/2015 with negative octreotide scan on 09/19/2014. -His case was presented at GI tumor board in Feb/March 2016 with recommendation for anatomic biopsies to guide role of surgical resection.  Anatomic biopsies in 03/2016 were negative. -His last 2 chromogranin A levels have been elevated January 2020 it was 186.0.  August 2020 it was  180.4.  Dr. Oneida Alar is aware of elevated levels and has seen the patient in consult and advised EGD.  Patient has refused at this time. -CT abdomen pelvis was negative for disease recurrence. -Patient has no signs and symptoms of carcinoid syndrome. -I have advised patient to follow-up with Dr. Oneida Alar. -His last DOT scan was done in February 2020 was negative for any disease. -Labs done on 11/17/2019 showed chromogranin levels have come down to 149.5. -He will follow-up in 6 months with repeat labs.  2.  Pulmonary nodule: -6 mm LUL pulmonary nodule on neck PET scan done on 10/18/2018. -CT chest from September 2020 revealed stable left lung nodule. -Recommend to repeat CT scan of chest in 1 year. -We will order the scan prior to his next visit.  3.  Elevated LFTs: -Likely secondary to alcohol abuse. -Recent PET scan done 10/18/2018 showed no abnormal liver findings.      Orders placed this encounter:  Orders Placed This Encounter  Procedures  . CT Chest Wo Contrast  . Lactate dehydrogenase  . CBC with Differential/Platelet  . Comprehensive metabolic panel  . Vitamin B12  . VITAMIN D 25 Hydroxy (Vit-D Deficiency, Fractures)  . Chromogranin A      Francene Finders, FNP-C [facility] [phone]

## 2019-11-24 NOTE — Assessment & Plan Note (Addendum)
1. Well- differentiated neuroendocrine carcinoid tumor: -Polypectomy by Dr. Oneida Alar on 08/14/2014 and 10/08/2015 with negative octreotide scan on 09/19/2014. -His case was presented at GI tumor board in Feb/March 2016 with recommendation for anatomic biopsies to guide role of surgical resection.  Anatomic biopsies in 03/2016 were negative. -His last 2 chromogranin A levels have been elevated January 2020 it was 186.0.  August 2020 it was 180.4.  Dr. Oneida Alar is aware of elevated levels and has seen the patient in consult and advised EGD.  Patient has refused at this time. -CT abdomen pelvis was negative for disease recurrence. -Patient has no signs and symptoms of carcinoid syndrome. -I have advised patient to follow-up with Dr. Oneida Alar. -His last DOT scan was done in February 2020 was negative for any disease. -Labs done on 11/17/2019 showed chromogranin levels have come down to 149.5. -He will follow-up in 6 months with repeat labs.  2.  Pulmonary nodule: -6 mm LUL pulmonary nodule on neck PET scan done on 10/18/2018. -CT chest from September 2020 revealed stable left lung nodule. -Recommend to repeat CT scan of chest in 1 year. -We will order the scan prior to his next visit.  3.  Elevated LFTs: -Likely secondary to alcohol abuse. -Recent PET scan done 10/18/2018 showed no abnormal liver findings.

## 2019-11-24 NOTE — Patient Instructions (Signed)
[facility] at [facility] Discharge Instructions  Follow up in 6 months with labs    Thank you for choosing [facility] at [facility] to provide your oncology and hematology care.  To afford each patient quality time with our provider, please arrive at least 15 minutes before your scheduled appointment time.   If you have a lab appointment with the Cancer Center please come in thru the Main Entrance and check in at the main information desk.  You need to re-schedule your appointment should you arrive 10 or more minutes late.  We strive to give you quality time with our providers, and arriving late affects you and other patients whose appointments are after yours.  Also, if you no show three or more times for appointments you may be dismissed from the clinic at the providers discretion.     Again, thank you for choosing [facility].  Our hope is that these requests will decrease the amount of time that you wait before being seen by our physicians.       _____________________________________________________________  Should you have questions after your visit to [facility], please contact our office at [phone] between the hours of 8:00 a.m. and 4:30 p.m.  Voicemails left after 4:00 p.m. will not be returned until the following business day.  For prescription refill requests, have your pharmacy contact our office and allow 72 hours.    Due to Covid, you will need to wear a mask upon entering the hospital. If you do not have a mask, a mask will be given to you at the Main Entrance upon arrival. For doctor visits, patients may have 1 support person with them. For treatment visits, patients can not have anyone with them due to social distancing guidelines and our immunocompromised population.      

## 2019-11-29 ENCOUNTER — Other Ambulatory Visit: Payer: Self-pay

## 2019-11-29 ENCOUNTER — Ambulatory Visit (INDEPENDENT_AMBULATORY_CARE_PROVIDER_SITE_OTHER): Payer: Medicare Other | Admitting: Cardiology

## 2019-11-29 ENCOUNTER — Encounter: Payer: Self-pay | Admitting: Cardiology

## 2019-11-29 VITALS — BP 118/80 | HR 90 | Ht 75.0 in | Wt 236.0 lb

## 2019-11-29 DIAGNOSIS — I10 Essential (primary) hypertension: Secondary | ICD-10-CM

## 2019-11-29 DIAGNOSIS — I471 Supraventricular tachycardia: Secondary | ICD-10-CM | POA: Diagnosis not present

## 2019-11-29 NOTE — Progress Notes (Signed)
Cardiology Office Note  Date: 11/29/2019   ID: [name], DOB 1950/12/23, MRN [id number]  PCP:  [name], MD  Cardiologist:  Rozann Lesches, MD Electrophysiologist:  None   Chief Complaint  Patient presents with  . History of SVT    History of Present Illness: [name] is a 69 y.o. male last seen in November 2018.  He presents overdue for follow-up.  Since last assessment he does not report any recurring.  He is following at the [facility] clinic.  I reviewed his medications which are outlined below.  He reports compliance.  He remains on Toprol-XL at 37.5 mg daily.  I personally reviewed his ECG today which shows normal sinus rhythm.  Past Medical History:  Diagnosis Date  . Anemia    FeDA: GASTRIC POLYPS, B12; TCS 2008 EGD 2009, 2008-HB 11.1 MCV 83.6 CR 1.22, 2009 FERRITIN 102-22  . B12 deficiency   . Carcinoid tumor determined by biopsy of stomach 09/25/2014  . Chronic knee pain   . Diverticulosis of colon    Lower GI bleed 2008  . Essential hypertension   . GERD (gastroesophageal reflux disease)   . Gout   . Hepatomegaly    Hepatic steatosis  . History of alcohol abuse   . History of septic arthritis   . Hypothyroidism   . PUD   . Substance abuse [facility])     Past Surgical History:  Procedure Laterality Date  . BIOPSY  08/14/2014   Procedure: BIOPSY;  Surgeon: Danie Binder, MD;  Location: [facility];  Service: Endoscopy;;  . BIOPSY  03/12/2015   Procedure: BIOPSY;  Surgeon: Danie Binder, MD;  Location: [facility];  Service: Endoscopy;;  . BIOPSY  04/21/2016   Procedure: BIOPSY;  Surgeon: Danie Binder, MD;  [facility];  Service: Endoscopy;;  bx's of antrum, body of stomach, fundus, and cardia  . CHOLECYSTECTOMY    . COLONOSCOPY  2008 [facility] DJ   LGIB 2o to TICS, prep good  . COLONOSCOPY WITH PROPOFOL N/A 08/14/2014   SLF: 1. Four large colon polyps removed. 2. The left colon is redundant 3. Moderate diverticulosis throughout teh entire examined  colon  . COLONOSCOPY WITH PROPOFOL N/A 11/06/2014   SLF: 8 small polyps removed. One large pedunculated polyp removed from the ascending colon, tubulovillous and tubular adenomas. Next colonoscopy March 2019  . COLONOSCOPY WITH PROPOFOL N/A 01/04/2018   Procedure: COLONOSCOPY WITH PROPOFOL;  Surgeon: Danie Binder, MD;  [facility];  Service: Endoscopy;  Laterality: N/A;  10:00am  . ESOPHAGOGASTRODUODENOSCOPY  12/08/2007   GLO:VFIE gastric polyps seen in the cardia and body of the stomach/Normal esophagus without evidence of Barrett's, mass, erosion/Normal duodenal bulb and second portion of the duodenum. Benign bx.  . ESOPHAGOGASTRODUODENOSCOPY (EGD) WITH PROPOFOL N/A 08/14/2014   SLF: 1. Heme postive stools due to gastric and colon polyps 2. Multiple gastric   . ESOPHAGOGASTRODUODENOSCOPY (EGD) WITH PROPOFOL N/A 03/12/2015   SLF: Multiple gastric nodules seen in gasric body/antrum. 2. Non-erosive gastritis ( inflammation) was found in the gastric antrum.   . ESOPHAGOGASTRODUODENOSCOPY (EGD) WITH PROPOFOL N/A 10/08/2015   Procedure: ESOPHAGOGASTRODUODENOSCOPY (EGD) WITH PROPOFOL;  Surgeon: Danie Binder, MD;  [facility];  Service: Endoscopy;  Laterality: N/A;  0945  . ESOPHAGOGASTRODUODENOSCOPY (EGD) WITH PROPOFOL N/A 04/21/2016   Procedure: ESOPHAGOGASTRODUODENOSCOPY (EGD) WITH PROPOFOL;  Surgeon: Danie Binder, MD;  [facility];  Service: Endoscopy;  Laterality: N/A;  730   .  HAND SURGERY Right    fracture repair with plates  . INGUINAL HERNIA REPAIR Left 11/09/2018   Procedure: HERNIA REPAIR INGUINAL ADULT WITH MESH;  Surgeon: Virl Cagey, MD;  Location: [facility];  Service: General;  Laterality: Left;  . JOINT REPLACEMENT Bilateral    knees  . POLYPECTOMY  08/14/2014   Procedure: POLYPECTOMY;  Surgeon: Danie Binder, MD;  Location: [facility];  Service: Endoscopy;;  . POLYPECTOMY N/A 11/06/2014   Procedure: POLYPECTOMY;  Surgeon: Danie Binder, MD;   Location: [facility];  Service: Endoscopy;  Laterality: N/A;  Transverse Colon x3, Ascending Colon x2, Descending Colon x3  . REPLACEMENT TOTAL KNEE BILATERAL    . UPPER GASTROINTESTINAL ENDOSCOPY  APR 2009   INFLAMED HYPERPLASTIC POLYPS, CHRONIC GASTRITIS    Current Outpatient Medications  Medication Sig Dispense Refill  . amLODipine (NORVASC) 5 MG tablet TAKE 1 TABLET BY MOUTH ONCE DAILY. 90 tablet 3  . atorvastatin (LIPITOR) 20 MG tablet Take 1 tablet (20 mg total) by mouth daily. 90 tablet 3  . clindamycin (CLINDAGEL) 1 % gel Apply 1 application topically daily. Applies to back of neck    . colchicine 0.6 MG tablet TAKE 1 TABLET BY MOUTH 3 TIMES DAILY X5 DAYS FOR GOUT PAIN. 15 tablet 0  . HYDROcodone-acetaminophen (NORCO/VICODIN) 5-325 MG tablet One tablet by mouth every six hours as needed for pain.  Must last 30 days. 56 tablet 0  . iron polysaccharides (NIFEREX) 150 MG capsule Take 1 capsule (150 mg total) by mouth daily. 90 capsule 1  . levothyroxine (SYNTHROID, LEVOTHROID) 200 MCG tablet Take 1 tablet (200 mcg total) by mouth daily before breakfast. 90 tablet 3  . metoprolol succinate (TOPROL-XL) 25 MG 24 hr tablet TAKE 1 & 1/2 TABLETS BY MOUTH DAILY. (Patient taking differently: Take 37.5 mg by mouth daily. ) 45 tablet 9  . omeprazole (PRILOSEC) 20 MG capsule TAKE 1 CAPSULE BY MOUTH ONCE DAILY. (Patient taking differently: Take 20 mg by mouth daily. ) 30 capsule 3  . sildenafil (VIAGRA) 100 MG tablet Take 50 mg by mouth daily as needed.    . tamsulosin (FLOMAX) 0.4 MG CAPS capsule Take 1 capsule (0.4 mg total) by mouth daily after supper. (Patient taking differently: Take 0.4 mg by mouth daily. ) 30 capsule 3  . ULORIC 40 MG tablet TAKE 1 TABLET BY MOUTH ONCE A DAY. (Patient taking differently: Take 40 mg by mouth daily. ) 90 tablet 0   No current facility-administered medications for this visit.   Allergies:  Aspirin   ROS:   No lightheadedness or syncope.  Physical Exam: VS:  BP  118/80   Pulse 90   Ht 6\' 3"  (1.905 m)   Wt 236 lb (107 kg)   SpO2 97%   BMI 29.50 kg/m , BMI Body mass index is 29.5 kg/m.  Wt Readings from Last 3 Encounters:  11/29/19 236 lb (107 kg)  11/24/19 238 lb 4.8 oz (108.1 kg)  10/03/19 240 lb (108.9 kg)    General: Patient appears comfortable at rest. HEENT: Conjunctiva and lids normal, wearing a mask. Neck: Supple, no elevated JVP or carotid bruits, no thyromegaly. Lungs: Clear to auscultation, nonlabored breathing at rest. Cardiac: Regular rate and rhythm, no S3, soft systolic murmur. Abdomen: Soft, nontender, bowel sounds present. Extremities: No pitting edema, distal pulses 2+.  ECG:  An ECG dated 03/07/2019 was personally reviewed today and demonstrated:  Sinus rhythm with lead motion artifact.  Recent Labwork: 03/07/2019: Magnesium 1.7 11/17/2019:  ALT 27; AST 37; BUN 13; Creatinine, Ser 1.11; Hemoglobin 13.0; Platelets 143; Potassium 3.5; Sodium 140     Component Value Date/Time   CHOL 150 01/20/2017 0926   TRIG 154 (H) 01/20/2017 0926   HDL 29 (L) 01/20/2017 0926   CHOLHDL 5.2 (H) 01/20/2017 0926   VLDL 31 (H) 01/20/2017 0926   LDLCALC 90 01/20/2017 0926    Other Studies Reviewed Today:  Echocardiogram 05/26/2017: Study Conclusions   - Left ventricle: The cavity size was normal. Wall thickness was  normal. Systolic function was normal. The estimated ejection  fraction was in the range of 55% to 60%. Diastolic function is  abnormal, indeterminant grade. Wall motion was normal; there were  no regional wall motion abnormalities.  - Aortic valve: Valve area (VTI): 2.39 cm^2. Valve area (Vmax):  2.39 cm^2. Valve area (Vmean): 2.11 cm^2.  - Left atrium: The atrium was moderately dilated.  - Atrial septum: No defect or patent foramen ovale was identified.  - Tricuspid valve: There was mild-moderate regurgitation.  - Pulmonary arteries: Systolic pressure was moderately increased.  PA peak pressure: 46 mm Hg (S).    - Technically adequate study.   Assessment and Plan:  1.  History of PSVT, no recurring symptoms on Toprol-XL.  ECG reviewed today and normal.  Continue observation going forward and follow-up with PCP.  We can see him back if needed.  2.  Essential hypertension, blood pressure is well controlled today.  He is also on Norvasc.  Medication Adjustments/Labs and Tests Ordered: Current medicines are reviewed at length with the patient today.  Concerns regarding medicines are outlined above.   Tests Ordered: Orders Placed This Encounter  Procedures  . EKG 12-Lead    Medication Changes: No orders of the defined types were placed in this encounter.   Disposition:  Follow up as needed.  Signed, Satira Sark, MD, [facility] 11/29/2019 11:01 AM    [place] at [facility] [address] [place], [place] [address] Phone: [phone]; Fax: [phone]

## 2019-11-29 NOTE — Patient Instructions (Signed)
Medication Instructions:  Your physician recommends that you continue on your current medications as directed. Please refer to the Current Medication list given to you today.  *If you need a refill on your cardiac medications before your next appointment, please call your pharmacy*   Lab Work: None today If you have labs (blood work) drawn today and your tests are completely normal, you will receive your results only by: . MyChart Message (if you have MyChart) OR . A paper copy in the mail If you have any lab test that is abnormal or we need to change your treatment, we will call you to review the results.   Testing/Procedures: None today   Follow-Up: At [facility], you and your health needs are our priority.  As part of our continuing mission to provide you with exceptional heart care, we have created designated Provider Care Teams.  These Care Teams include your primary Cardiologist (physician) and Advanced Practice Providers (APPs -  Physician Assistants and Nurse Practitioners) who all work together to provide you with the care you need, when you need it.  We recommend signing up for the patient portal called "MyChart".  Sign up information is provided on this After Visit Summary.  MyChart is used to connect with patients for Virtual Visits (Telemedicine).  Patients are able to view lab/test results, encounter notes, upcoming appointments, etc.  Non-urgent messages can be sent to your provider as well.   To learn more about what you can do with MyChart, go to [email or web address].    Your next appointment:      As needed with Dr.McDowell      Thank you for choosing [facility] !        

## 2019-12-12 ENCOUNTER — Telehealth: Payer: Self-pay | Admitting: Orthopaedic Surgery

## 2019-12-12 MED ORDER — HYDROCODONE-ACETAMINOPHEN 5-325 MG PO TABS: ORAL_TABLET | ORAL | 0 refills | Status: DC

## 2019-12-12 NOTE — Telephone Encounter (Signed)
Patient requests refill on Hydrocodone/Acetaminophen 5-325  Mgs.  Qty  96  Sig: One tablet by mouth every six hours as needed for pain. Must last 30 days.  Patient states he uses [facility]

## 2019-12-25 ENCOUNTER — Other Ambulatory Visit: Payer: Self-pay | Admitting: Orthopaedic Surgery

## 2019-12-28 ENCOUNTER — Ambulatory Visit (INDEPENDENT_AMBULATORY_CARE_PROVIDER_SITE_OTHER): Payer: Medicare Other | Admitting: Orthopaedic Surgery

## 2019-12-28 ENCOUNTER — Encounter: Payer: Self-pay | Admitting: Orthopaedic Surgery

## 2019-12-28 ENCOUNTER — Other Ambulatory Visit: Payer: Self-pay

## 2019-12-28 VITALS — BP 127/78 | HR 83 | Ht 75.0 in | Wt 235.0 lb

## 2019-12-28 DIAGNOSIS — M1A0410 Idiopathic chronic gout, right hand, without tophus (tophi): Secondary | ICD-10-CM

## 2019-12-28 DIAGNOSIS — G894 Chronic pain syndrome: Secondary | ICD-10-CM

## 2019-12-28 NOTE — Progress Notes (Signed)
Patient [name], male DOB:1951-04-14, 69 y.o. FGH:[id number]  Chief Complaint  Patient presents with  . Gout    HPI  [name] is a 69 y.o. male who has gout and has periodic attacks despite taking his medicine.  He has not had recent attack.  He is doing well.  He has been taking his medicine.  He has no new trauma.   Body mass index is 29.37 kg/m.  ROS  Review of Systems  All other systems reviewed and are negative.  The following is a summary of the past history medically, past history surgically, known current medicines, social history and family history.  This information is gathered electronically by the computer from prior information and documentation.  I review this each visit and have found including this information at this point in the chart is beneficial and informative.    Past Medical History:  Diagnosis Date  . Anemia    FeDA: GASTRIC POLYPS, B12; TCS 2008 EGD 2009, 2008-HB 11.1 MCV 83.6 CR 1.22, 2009 FERRITIN 102-22  . B12 deficiency   . Carcinoid tumor determined by biopsy of stomach 09/25/2014  . Chronic knee pain   . Diverticulosis of colon    Lower GI bleed 2008  . Essential hypertension   . GERD (gastroesophageal reflux disease)   . Gout   . Hepatomegaly    Hepatic steatosis  . History of alcohol abuse   . History of septic arthritis   . Hypothyroidism   . PUD   . Substance abuse [facility])     Past Surgical History:  Procedure Laterality Date  . BIOPSY  08/14/2014   Procedure: BIOPSY;  Surgeon: Danie Binder, MD;  Location: [facility];  Service: Endoscopy;;  . BIOPSY  03/12/2015   Procedure: BIOPSY;  Surgeon: Danie Binder, MD;  Location: [facility];  Service: Endoscopy;;  . BIOPSY  04/21/2016   Procedure: BIOPSY;  Surgeon: Danie Binder, MD;  [facility];  Service: Endoscopy;;  bx's of antrum, body of stomach, fundus, and cardia  . CHOLECYSTECTOMY    . COLONOSCOPY  2008 [facility] DJ   LGIB 2o to TICS, prep good  . COLONOSCOPY WITH  PROPOFOL N/A 08/14/2014   SLF: 1. Four large colon polyps removed. 2. The left colon is redundant 3. Moderate diverticulosis throughout teh entire examined colon  . COLONOSCOPY WITH PROPOFOL N/A 11/06/2014   SLF: 8 small polyps removed. One large pedunculated polyp removed from the ascending colon, tubulovillous and tubular adenomas. Next colonoscopy March 2019  . COLONOSCOPY WITH PROPOFOL N/A 01/04/2018   Procedure: COLONOSCOPY WITH PROPOFOL;  Surgeon: Danie Binder, MD;  [facility];  Service: Endoscopy;  Laterality: N/A;  10:00am  . ESOPHAGOGASTRODUODENOSCOPY  12/08/2007   CVE:LFYB gastric polyps seen in the cardia and body of the stomach/Normal esophagus without evidence of Barrett's, mass, erosion/Normal duodenal bulb and second portion of the duodenum. Benign bx.  . ESOPHAGOGASTRODUODENOSCOPY (EGD) WITH PROPOFOL N/A 08/14/2014   SLF: 1. Heme postive stools due to gastric and colon polyps 2. Multiple gastric   . ESOPHAGOGASTRODUODENOSCOPY (EGD) WITH PROPOFOL N/A 03/12/2015   SLF: Multiple gastric nodules seen in gasric body/antrum. 2. Non-erosive gastritis ( inflammation) was found in the gastric antrum.   . ESOPHAGOGASTRODUODENOSCOPY (EGD) WITH PROPOFOL N/A 10/08/2015   Procedure: ESOPHAGOGASTRODUODENOSCOPY (EGD) WITH PROPOFOL;  Surgeon: Danie Binder, MD;  [facility];  Service: Endoscopy;  Laterality: N/A;  0945  . ESOPHAGOGASTRODUODENOSCOPY (EGD) WITH PROPOFOL N/A 04/21/2016   Procedure:  ESOPHAGOGASTRODUODENOSCOPY (EGD) WITH PROPOFOL;  Surgeon: Danie Binder, MD;  [facility];  Service: Endoscopy;  Laterality: N/A;  730   . HAND SURGERY Right    fracture repair with plates  . INGUINAL HERNIA REPAIR Left 11/09/2018   Procedure: HERNIA REPAIR INGUINAL ADULT WITH MESH;  Surgeon: Virl Cagey, MD;  Location: [facility];  Service: General;  Laterality: Left;  . JOINT REPLACEMENT Bilateral    knees  . POLYPECTOMY  08/14/2014   Procedure: POLYPECTOMY;   Surgeon: Danie Binder, MD;  Location: [facility];  Service: Endoscopy;;  . POLYPECTOMY N/A 11/06/2014   Procedure: POLYPECTOMY;  Surgeon: Danie Binder, MD;  Location: [facility];  Service: Endoscopy;  Laterality: N/A;  Transverse Colon x3, Ascending Colon x2, Descending Colon x3  . REPLACEMENT TOTAL KNEE BILATERAL    . UPPER GASTROINTESTINAL ENDOSCOPY  APR 2009   INFLAMED HYPERPLASTIC POLYPS, CHRONIC GASTRITIS    Family History  Problem Relation Age of Onset  . Diabetes Mother   . Renal Disease Mother        failure  . Diabetes Father        'old age'  . Hypertension Sister   . Hypertension Brother   . Diabetes Brother   . Diabetes Sister   . Diabetes Sister   . Diabetes Brother   . Hypertension Brother   . Diabetes Brother        right BKA  . Kidney failure Brother        on dialysis  . Kidney failure Brother        on dialysis  . Diabetes Brother        bilateral BKA  . Colon polyps Neg Hx   . Colon cancer Neg Hx   . Pancreatic disease Neg Hx     Social History Social History   Tobacco Use  . Smoking status: Never Smoker  . Smokeless tobacco: Never Used  . Tobacco comment: Quit x 10 years/ never smoked on regular basis  Substance Use Topics  . Alcohol use: Yes    Alcohol/week: 2.0 standard drinks    Types: 2 Shots of liquor per week    Comment: drinks on weekends, gin/vodka 1/5th.  . Drug use: No    Allergies  Allergen Reactions  . Aspirin Other (See Comments)    REACTION: Diverticular Bleed    Current Outpatient Medications  Medication Sig Dispense Refill  . amLODipine (NORVASC) 5 MG tablet TAKE 1 TABLET BY MOUTH ONCE DAILY. 90 tablet 3  . atorvastatin (LIPITOR) 20 MG tablet Take 1 tablet (20 mg total) by mouth daily. 90 tablet 3  . clindamycin (CLINDAGEL) 1 % gel Apply 1 application topically daily. Applies to back of neck    . colchicine 0.6 MG tablet TAKE 1 TABLET BY MOUTH 3 TIMES DAILY X5 DAYS FOR GOUT PAIN. 15 tablet 3  . HYDROcodone-acetaminophen  (NORCO/VICODIN) 5-325 MG tablet One tablet by mouth every six hours as needed for pain.  Must last 30 days. 56 tablet 0  . iron polysaccharides (NIFEREX) 150 MG capsule Take 1 capsule (150 mg total) by mouth daily. 90 capsule 1  . levothyroxine (SYNTHROID, LEVOTHROID) 200 MCG tablet Take 1 tablet (200 mcg total) by mouth daily before breakfast. 90 tablet 3  . metoprolol succinate (TOPROL-XL) 25 MG 24 hr tablet TAKE 1 & 1/2 TABLETS BY MOUTH DAILY. (Patient taking differently: Take 37.5 mg by mouth daily. ) 45 tablet 9  . omeprazole (PRILOSEC) 20 MG capsule  TAKE 1 CAPSULE BY MOUTH ONCE DAILY. (Patient taking differently: Take 20 mg by mouth daily. ) 30 capsule 3  . sildenafil (VIAGRA) 100 MG tablet Take 50 mg by mouth daily as needed.    . tamsulosin (FLOMAX) 0.4 MG CAPS capsule Take 1 capsule (0.4 mg total) by mouth daily after supper. (Patient taking differently: Take 0.4 mg by mouth daily. ) 30 capsule 3  . ULORIC 40 MG tablet TAKE 1 TABLET BY MOUTH ONCE A DAY. (Patient taking differently: Take 40 mg by mouth daily. ) 90 tablet 0   No current facility-administered medications for this visit.     Physical Exam  Blood pressure 127/78, pulse 83, height 6\' 3"  (1.905 m), weight 235 lb (106.6 kg).  Constitutional: overall normal hygiene, normal nutrition, well developed, normal grooming, normal body habitus. Assistive device:cane  Musculoskeletal: gait and station Limp left, muscle tone and strength are normal, no tremors or atrophy is present.  .  Neurological: coordination overall normal.  Deep tendon reflex/nerve stretch intact.  Sensation normal.  Cranial nerves II-XII intact.   Skin:   Normal overall no scars, lesions, ulcers or rashes. No psoriasis.  Psychiatric: Alert and oriented x 3.  Recent memory intact, remote memory unclear.  Normal mood and affect. Well groomed.  Good eye contact.  Cardiovascular: overall no swelling, no varicosities, no edema bilaterally, normal temperatures of  the legs and arms, no clubbing, cyanosis and good capillary refill.  Lymphatic: palpation is normal.  All other systems reviewed and are negative   The patient has been educated about the nature of the problem(s) and counseled on treatment options.  The patient appeared to understand what I have discussed and is in agreement with it.  Encounter Diagnoses  Name Primary?  . 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 Chronic pain disorder     PLAN Call if any problems.  Precautions discussed.  Continue current medications.   Return to clinic 3 months   Electronically Signed Sanjuana Kava, MD 4/29/202110:05 AM

## 2020-01-04 DIAGNOSIS — I10 Essential (primary) hypertension: Secondary | ICD-10-CM | POA: Diagnosis not present

## 2020-01-04 DIAGNOSIS — E039 Hypothyroidism, unspecified: Secondary | ICD-10-CM | POA: Diagnosis not present

## 2020-01-04 DIAGNOSIS — R21 Rash and other nonspecific skin eruption: Secondary | ICD-10-CM | POA: Diagnosis not present

## 2020-01-08 ENCOUNTER — Other Ambulatory Visit (HOSPITAL_COMMUNITY): Payer: Self-pay | Admitting: *Deleted

## 2020-01-08 MED ORDER — POLYSACCHARIDE IRON COMPLEX 150 MG PO CAPS: 150.0000 mg | ORAL_CAPSULE | Freq: Every day | ORAL | 1 refills | Status: DC

## 2020-01-08 NOTE — Telephone Encounter (Signed)
Refilled [facility] per Francene Finders, NP's

## 2020-01-09 MED ORDER — PREDNISONE 5 MG (21) PO TBPK: ORAL_TABLET | ORAL | 0 refills | Status: DC

## 2020-01-09 NOTE — Addendum Note (Signed)
Addended by: Willette Pa on: 01/09/2020 03:36 PM   Modules accepted: Orders

## 2020-01-12 ENCOUNTER — Telehealth: Payer: Self-pay

## 2020-01-12 NOTE — Telephone Encounter (Signed)
Hydrocodone-Acetaminophen 5/325mg  Qty 56 Tablets  PATIENT USES [facility]

## 2020-01-15 MED ORDER — HYDROCODONE-ACETAMINOPHEN 5-325 MG PO TABS: ORAL_TABLET | ORAL | 0 refills | Status: DC

## 2020-02-14 ENCOUNTER — Other Ambulatory Visit: Payer: Self-pay | Admitting: Radiology

## 2020-02-14 MED ORDER — HYDROCODONE-ACETAMINOPHEN 5-325 MG PO TABS: ORAL_TABLET | ORAL | 0 refills | Status: DC

## 2020-02-14 NOTE — Telephone Encounter (Signed)
Rx refill requested.   [facility].

## 2020-02-19 ENCOUNTER — Other Ambulatory Visit: Payer: Self-pay | Admitting: Cardiology

## 2020-03-14 ENCOUNTER — Telehealth: Payer: Self-pay | Admitting: Orthopaedic Surgery

## 2020-03-14 MED ORDER — HYDROCODONE-ACETAMINOPHEN 5-325 MG PO TABS: ORAL_TABLET | ORAL | 0 refills | Status: DC

## 2020-03-14 NOTE — Telephone Encounter (Signed)
Call received from patient for refill: HYDROcodone-acetaminophen (NORCO/VICODIN) 5-325 MG tablet 56 tablet  -[facility]

## 2020-03-15 IMAGING — DX DG CHEST 2V
2 series · 2 of 2 positions shown · non-contrast
Comparison: Radiographs February 18, 2017.

CLINICAL DATA: Fever, nonproductive cough.

EXAM:
CHEST - 2 VIEW

[chest pa]
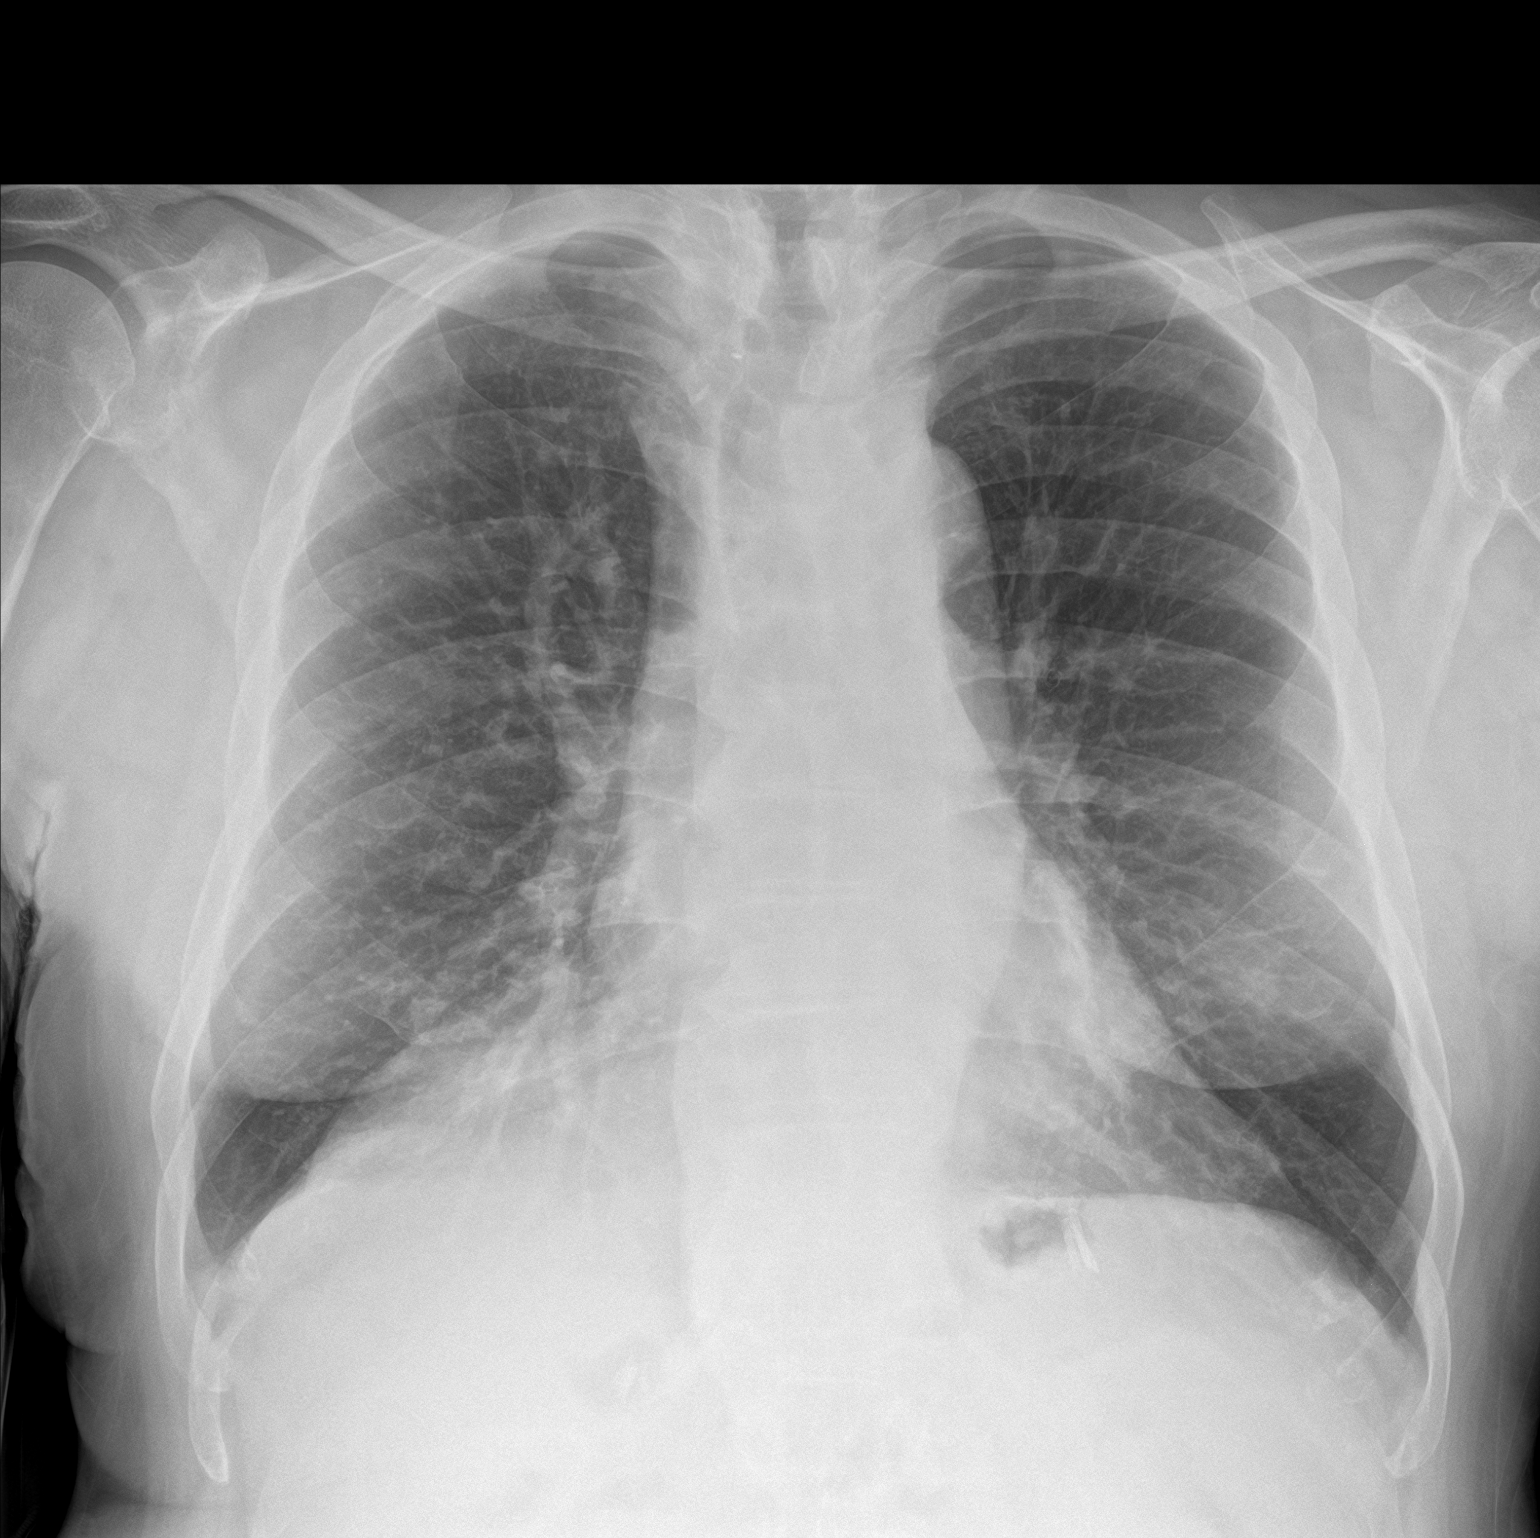

[chest lat]
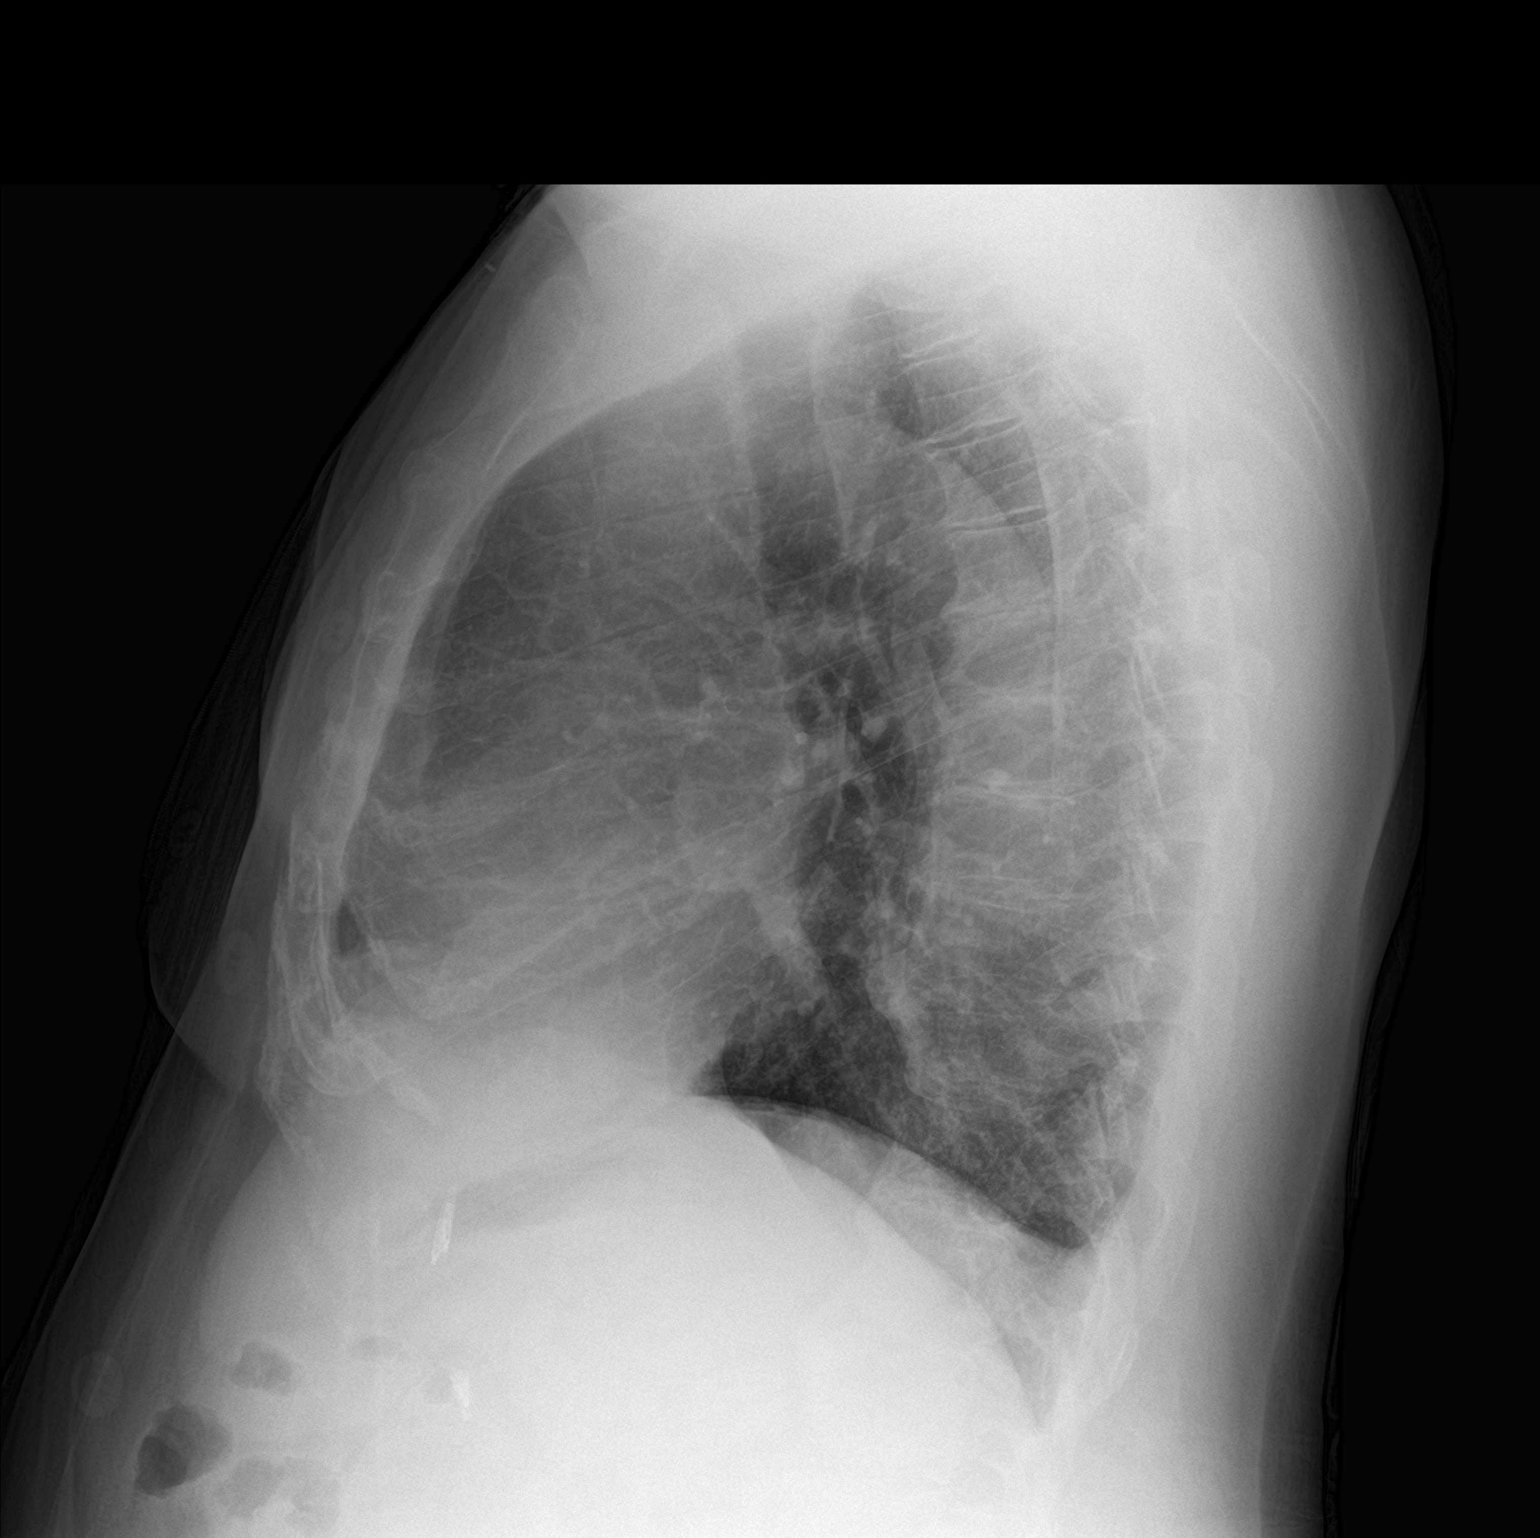

[2 of 2 positions shown; findings below may reference images not displayed]

FINDINGS: The heart size and mediastinal contours are within normal limits.
Both lungs are clear. The visualized skeletal structures are
unremarkable.
IMPRESSION: No active cardiopulmonary disease.

## 2020-03-26 ENCOUNTER — Other Ambulatory Visit: Payer: Self-pay | Admitting: Orthopaedic Surgery

## 2020-03-28 ENCOUNTER — Encounter: Payer: Self-pay | Admitting: Orthopaedic Surgery

## 2020-03-28 ENCOUNTER — Other Ambulatory Visit: Payer: Self-pay

## 2020-03-28 ENCOUNTER — Ambulatory Visit (INDEPENDENT_AMBULATORY_CARE_PROVIDER_SITE_OTHER): Payer: Medicare Other | Admitting: Orthopaedic Surgery

## 2020-03-28 VITALS — BP 156/101 | HR 94 | Ht 75.0 in | Wt 240.0 lb

## 2020-03-28 DIAGNOSIS — G894 Chronic pain syndrome: Secondary | ICD-10-CM | POA: Diagnosis not present

## 2020-03-28 DIAGNOSIS — M1A0410 Idiopathic chronic gout, right hand, without tophus (tophi): Secondary | ICD-10-CM

## 2020-03-28 NOTE — Progress Notes (Signed)
Patient [name], male DOB:Mar 02, 1951, 69 y.o. AVW:[id number]  Chief Complaint  Patient presents with  . Gout    HPI  [name] is a 69 y.o. male who has chronic gout.  He is taking his medicine.  He has not had recent attack.  He has a cane.  He has no trauma.     Body mass index is 30 kg/m.  ROS  Review of Systems  HENT: Negative for congestion.   Respiratory: Negative for cough and shortness of breath.   Cardiovascular: Negative for chest pain and leg swelling.  Endocrine: Positive for cold intolerance.  Musculoskeletal: Positive for arthralgias and joint swelling.  Allergic/Immunologic: Positive for environmental allergies.  All other systems reviewed and are negative.   All other systems reviewed and are negative.  The following is a summary of the past history medically, past history surgically, known current medicines, social history and family history.  This information is gathered electronically by the computer from prior information and documentation.  I review this each visit and have found including this information at this point in the chart is beneficial and informative.    Past Medical History:  Diagnosis Date  . Anemia    FeDA: GASTRIC POLYPS, B12; TCS 2008 EGD 2009, 2008-HB 11.1 MCV 83.6 CR 1.22, 2009 FERRITIN 102-22  . B12 deficiency   . Carcinoid tumor determined by biopsy of stomach 09/25/2014  . Chronic knee pain   . Diverticulosis of colon    Lower GI bleed 2008  . Essential hypertension   . GERD (gastroesophageal reflux disease)   . Gout   . Hepatomegaly    Hepatic steatosis  . History of alcohol abuse   . History of septic arthritis   . Hypothyroidism   . PUD   . Substance abuse [facility])     Past Surgical History:  Procedure Laterality Date  . BIOPSY  08/14/2014   Procedure: BIOPSY;  Surgeon: Danie Binder, MD;  Location: [facility];  Service: Endoscopy;;  . BIOPSY  03/12/2015   Procedure: BIOPSY;  Surgeon: Danie Binder, MD;   Location: [facility];  Service: Endoscopy;;  . BIOPSY  04/21/2016   Procedure: BIOPSY;  Surgeon: Danie Binder, MD;  [facility];  Service: Endoscopy;;  bx's of antrum, body of stomach, fundus, and cardia  . CHOLECYSTECTOMY    . COLONOSCOPY  2008 [facility] DJ   LGIB 2o to TICS, prep good  . COLONOSCOPY WITH PROPOFOL N/A 08/14/2014   SLF: 1. Four large colon polyps removed. 2. The left colon is redundant 3. Moderate diverticulosis throughout teh entire examined colon  . COLONOSCOPY WITH PROPOFOL N/A 11/06/2014   SLF: 8 small polyps removed. One large pedunculated polyp removed from the ascending colon, tubulovillous and tubular adenomas. Next colonoscopy March 2019  . COLONOSCOPY WITH PROPOFOL N/A 01/04/2018   Procedure: COLONOSCOPY WITH PROPOFOL;  Surgeon: Danie Binder, MD;  [facility];  Service: Endoscopy;  Laterality: N/A;  10:00am  . ESOPHAGOGASTRODUODENOSCOPY  12/08/2007   WGN:FAOZ gastric polyps seen in the cardia and body of the stomach/Normal esophagus without evidence of Barrett's, mass, erosion/Normal duodenal bulb and second portion of the duodenum. Benign bx.  . ESOPHAGOGASTRODUODENOSCOPY (EGD) WITH PROPOFOL N/A 08/14/2014   SLF: 1. Heme postive stools due to gastric and colon polyps 2. Multiple gastric   . ESOPHAGOGASTRODUODENOSCOPY (EGD) WITH PROPOFOL N/A 03/12/2015   SLF: Multiple gastric nodules seen in gasric body/antrum. 2. Non-erosive gastritis ( inflammation) was found in the gastric  antrum.   . ESOPHAGOGASTRODUODENOSCOPY (EGD) WITH PROPOFOL N/A 10/08/2015   Procedure: ESOPHAGOGASTRODUODENOSCOPY (EGD) WITH PROPOFOL;  Surgeon: Danie Binder, MD;  [facility];  Service: Endoscopy;  Laterality: N/A;  0945  . ESOPHAGOGASTRODUODENOSCOPY (EGD) WITH PROPOFOL N/A 04/21/2016   Procedure: ESOPHAGOGASTRODUODENOSCOPY (EGD) WITH PROPOFOL;  Surgeon: Danie Binder, MD;  [facility];  Service: Endoscopy;  Laterality: N/A;  730   . HAND SURGERY Right     fracture repair with plates  . INGUINAL HERNIA REPAIR Left 11/09/2018   Procedure: HERNIA REPAIR INGUINAL ADULT WITH MESH;  Surgeon: Virl Cagey, MD;  Location: [facility];  Service: General;  Laterality: Left;  . JOINT REPLACEMENT Bilateral    knees  . POLYPECTOMY  08/14/2014   Procedure: POLYPECTOMY;  Surgeon: Danie Binder, MD;  Location: [facility];  Service: Endoscopy;;  . POLYPECTOMY N/A 11/06/2014   Procedure: POLYPECTOMY;  Surgeon: Danie Binder, MD;  Location: [facility];  Service: Endoscopy;  Laterality: N/A;  Transverse Colon x3, Ascending Colon x2, Descending Colon x3  . REPLACEMENT TOTAL KNEE BILATERAL    . UPPER GASTROINTESTINAL ENDOSCOPY  APR 2009   INFLAMED HYPERPLASTIC POLYPS, CHRONIC GASTRITIS    Family History  Problem Relation Age of Onset  . Diabetes Mother   . Renal Disease Mother        failure  . Diabetes Father        'old age'  . Hypertension Sister   . Hypertension Brother   . Diabetes Brother   . Diabetes Sister   . Diabetes Sister   . Diabetes Brother   . Hypertension Brother   . Diabetes Brother        right BKA  . Kidney failure Brother        on dialysis  . Kidney failure Brother        on dialysis  . Diabetes Brother        bilateral BKA  . Colon polyps Neg Hx   . Colon cancer Neg Hx   . Pancreatic disease Neg Hx     Social History Social History   Tobacco Use  . Smoking status: Never Smoker  . Smokeless tobacco: Never Used  . Tobacco comment: Quit x 10 years/ never smoked on regular basis  Vaping Use  . Vaping Use: Never used  Substance Use Topics  . Alcohol use: Yes    Alcohol/week: 2.0 standard drinks    Types: 2 Shots of liquor per week    Comment: drinks on weekends, gin/vodka 1/5th.  . Drug use: No    Allergies  Allergen Reactions  . Aspirin Other (See Comments)    REACTION: Diverticular Bleed    Current Outpatient Medications  Medication Sig Dispense Refill  . amLODipine (NORVASC) 5 MG tablet TAKE 1 TABLET BY MOUTH  ONCE DAILY. 90 tablet 3  . atorvastatin (LIPITOR) 20 MG tablet Take 1 tablet (20 mg total) by mouth daily. 90 tablet 3  . clindamycin (CLINDAGEL) 1 % gel Apply 1 application topically daily. Applies to back of neck    . colchicine 0.6 MG tablet TAKE 1 TABLET BY MOUTH 3 TIMES DAILY X5 DAYS FOR GOUT PAIN. 15 tablet 4  . HYDROcodone-acetaminophen (NORCO/VICODIN) 5-325 MG tablet One tablet by mouth every six hours as needed for pain.  Must last 30 days. 56 tablet 0  . iron polysaccharides (NIFEREX) 150 MG capsule Take 1 capsule (150 mg total) by mouth daily. 90 capsule 1  . levothyroxine (SYNTHROID,  LEVOTHROID) 200 MCG tablet Take 1 tablet (200 mcg total) by mouth daily before breakfast. 90 tablet 3  . metoprolol succinate (TOPROL-XL) 25 MG 24 hr tablet Take 1.5 tablets (37.5 mg total) by mouth daily. 45 tablet 6  . omeprazole (PRILOSEC) 20 MG capsule TAKE 1 CAPSULE BY MOUTH ONCE DAILY. (Patient taking differently: Take 20 mg by mouth daily. ) 30 capsule 3  . predniSONE (STERAPRED UNI-PAK 21 TAB) 5 MG (21) TBPK tablet Take 6 pills first day; 5 pills second day; 4 pills third day; 3 pills fourth day; 2 pills next day and 1 pill last day. 21 tablet 0  . sildenafil (VIAGRA) 100 MG tablet Take 50 mg by mouth daily as needed.    . tamsulosin (FLOMAX) 0.4 MG CAPS capsule Take 1 capsule (0.4 mg total) by mouth daily after supper. (Patient taking differently: Take 0.4 mg by mouth daily. ) 30 capsule 3  . ULORIC 40 MG tablet TAKE 1 TABLET BY MOUTH ONCE A DAY. (Patient taking differently: Take 40 mg by mouth daily. ) 90 tablet 0   No current facility-administered medications for this visit.     Physical Exam  Blood pressure (!) 156/101, pulse 94, height 6\' 3"  (1.905 m), weight (!) 240 lb (108.9 kg).  Constitutional: overall normal hygiene, normal nutrition, well developed, normal grooming, normal body habitus. Assistive device:cane  Musculoskeletal: gait and station Limp right, muscle tone and strength  are normal, no tremors or atrophy is present.  .  Neurological: coordination overall normal.  Deep tendon reflex/nerve stretch intact.  Sensation normal.  Cranial nerves II-XII intact.   Skin:   Normal overall no scars, lesions, ulcers or rashes. No psoriasis.  Psychiatric: Alert and oriented x 3.  Recent memory intact, remote memory unclear.  Normal mood and affect. Well groomed.  Good eye contact.  Cardiovascular: overall no swelling, no varicosities, no edema bilaterally, normal temperatures of the legs and arms, no clubbing, cyanosis and good capillary refill.  Lymphatic: palpation is normal.  All other systems reviewed and are negative   The patient has been educated about the nature of the problem(s) and counseled on treatment options.  The patient appeared to understand what I have discussed and is in agreement with it.  Encounter Diagnoses  Name Primary?  . 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 Chronic pain disorder     PLAN Call if any problems.  Precautions discussed.  Continue current medications.   Return to clinic 3 months   Electronically Signed Sanjuana Kava, MD 7/29/202110:26 AM

## 2020-04-08 DIAGNOSIS — Z0001 Encounter for general adult medical examination with abnormal findings: Secondary | ICD-10-CM | POA: Diagnosis not present

## 2020-04-08 DIAGNOSIS — E785 Hyperlipidemia, unspecified: Secondary | ICD-10-CM | POA: Diagnosis not present

## 2020-04-08 DIAGNOSIS — Z79899 Other long term (current) drug therapy: Secondary | ICD-10-CM | POA: Diagnosis not present

## 2020-04-08 DIAGNOSIS — D509 Iron deficiency anemia, unspecified: Secondary | ICD-10-CM | POA: Diagnosis not present

## 2020-04-08 DIAGNOSIS — E039 Hypothyroidism, unspecified: Secondary | ICD-10-CM | POA: Diagnosis not present

## 2020-04-16 ENCOUNTER — Telehealth: Payer: Self-pay | Admitting: Orthopaedic Surgery

## 2020-04-16 MED ORDER — HYDROCODONE-ACETAMINOPHEN 5-325 MG PO TABS: ORAL_TABLET | ORAL | 0 refills | Status: DC

## 2020-04-16 NOTE — Telephone Encounter (Signed)
Patient requests refill on Hydrocodone/Acetaminophen 5-325  Mgs.  Qty  23  Sig: One tablet by mouth every six hours as needed for pain. Must last 30 days.  Patient states he uses [facility]

## 2020-04-30 ENCOUNTER — Encounter: Payer: Self-pay | Admitting: Internal Medicine

## 2020-05-03 DIAGNOSIS — Z79899 Other long term (current) drug therapy: Secondary | ICD-10-CM | POA: Diagnosis not present

## 2020-05-03 DIAGNOSIS — E785 Hyperlipidemia, unspecified: Secondary | ICD-10-CM | POA: Diagnosis not present

## 2020-05-03 DIAGNOSIS — I10 Essential (primary) hypertension: Secondary | ICD-10-CM | POA: Diagnosis not present

## 2020-05-03 DIAGNOSIS — D509 Iron deficiency anemia, unspecified: Secondary | ICD-10-CM | POA: Diagnosis not present

## 2020-05-16 ENCOUNTER — Telehealth: Payer: Self-pay | Admitting: Orthopaedic Surgery

## 2020-05-16 MED ORDER — HYDROCODONE-ACETAMINOPHEN 5-325 MG PO TABS: ORAL_TABLET | ORAL | 0 refills | Status: DC

## 2020-05-16 NOTE — Telephone Encounter (Signed)
Patient requests refill on Hydrocodone/Acetaminophen 5-325 Mgs. Qty 56  Sig:One tablet by mouth every six hours as needed for pain. Must last 30 days.  Patient states he uses [facility]

## 2020-05-17 ENCOUNTER — Other Ambulatory Visit (HOSPITAL_COMMUNITY): Payer: Self-pay | Admitting: *Deleted

## 2020-05-17 MED ORDER — POLYSACCHARIDE IRON COMPLEX 150 MG PO CAPS: 150.0000 mg | ORAL_CAPSULE | Freq: Every day | ORAL | 1 refills | Status: DC

## 2020-05-20 ENCOUNTER — Other Ambulatory Visit (HOSPITAL_COMMUNITY): Payer: Self-pay | Admitting: *Deleted

## 2020-05-20 MED ORDER — POLYSACCHARIDE IRON COMPLEX 150 MG PO CAPS: 150.0000 mg | ORAL_CAPSULE | Freq: Every day | ORAL | 1 refills | Status: DC

## 2020-05-20 MED FILL — FERREX 150 CAPSULE: 150 | 90 days supply | Qty: 90 | Fill #0

## 2020-05-26 ENCOUNTER — Emergency Department (HOSPITAL_COMMUNITY)
Admission: EM | Admit: 2020-05-26 | Discharge: 2020-05-26 | Disposition: A | Discharge status: Home / Self Care | Payer: Medicare Other | Attending: Emergency Medicine | Admitting: Emergency Medicine

## 2020-05-26 ENCOUNTER — Emergency Department (HOSPITAL_COMMUNITY): Payer: Medicare Other

## 2020-05-26 ENCOUNTER — Encounter (HOSPITAL_COMMUNITY): Payer: Self-pay | Admitting: Emergency Medicine

## 2020-05-26 ENCOUNTER — Other Ambulatory Visit: Payer: Self-pay

## 2020-05-26 DIAGNOSIS — Z79899 Other long term (current) drug therapy: Secondary | ICD-10-CM | POA: Diagnosis not present

## 2020-05-26 DIAGNOSIS — Z96653 Presence of artificial knee joint, bilateral: Secondary | ICD-10-CM | POA: Insufficient documentation

## 2020-05-26 DIAGNOSIS — N3000 Acute cystitis without hematuria: Secondary | ICD-10-CM | POA: Insufficient documentation

## 2020-05-26 DIAGNOSIS — R000 Tachycardia, unspecified: Secondary | ICD-10-CM | POA: Insufficient documentation

## 2020-05-26 DIAGNOSIS — R05 Cough: Secondary | ICD-10-CM | POA: Diagnosis not present

## 2020-05-26 DIAGNOSIS — I10 Essential (primary) hypertension: Secondary | ICD-10-CM | POA: Insufficient documentation

## 2020-05-26 DIAGNOSIS — E039 Hypothyroidism, unspecified: Secondary | ICD-10-CM | POA: Diagnosis not present

## 2020-05-26 DIAGNOSIS — Z20822 Contact with and (suspected) exposure to covid-19: Secondary | ICD-10-CM | POA: Diagnosis not present

## 2020-05-26 DIAGNOSIS — Z79890 Hormone replacement therapy (postmenopausal): Secondary | ICD-10-CM | POA: Diagnosis not present

## 2020-05-26 DIAGNOSIS — R509 Fever, unspecified: Secondary | ICD-10-CM | POA: Diagnosis not present

## 2020-05-26 LAB — URINALYSIS, ROUTINE W REFLEX MICROSCOPIC
Bilirubin Urine: NEGATIVE
Glucose, UA: NEGATIVE mg/dL
Ketones, ur: 20 mg/dL — AB
Nitrite: POSITIVE — AB
Protein, ur: 30 mg/dL — AB
Specific Gravity, Urine: 1.014 (ref 1.005–1.030)
WBC, UA: >50 WBC/hpf — ABNORMAL HIGH (ref 0–5)
pH: 5 (ref 5.0–8.0)

## 2020-05-26 LAB — CBC WITH DIFFERENTIAL/PLATELET
Abs Immature Granulocytes: 0.02 10*3/uL (ref 0.00–0.07)
Basophils Absolute: 0 10*3/uL (ref 0.0–0.1)
Basophils Relative: 0 %
Eosinophils Absolute: 0 10*3/uL (ref 0.0–0.5)
Eosinophils Relative: 0 %
HCT: 43.6 % (ref 39.0–52.0)
Hemoglobin: 14.1 g/dL (ref 13.0–17.0)
Immature Granulocytes: 0 %
Lymphocytes Relative: 14 %
Lymphs Abs: 1.2 10*3/uL (ref 0.7–4.0)
MCH: 33.6 pg (ref 26.0–34.0)
MCHC: 32.3 g/dL (ref 30.0–36.0)
MCV: 103.8 fL — ABNORMAL HIGH (ref 80.0–100.0)
Monocytes Absolute: 0.5 10*3/uL (ref 0.1–1.0)
Monocytes Relative: 6 %
Neutro Abs: 6.8 10*3/uL (ref 1.7–7.7)
Neutrophils Relative %: 80 %
Platelets: 113 10*3/uL — ABNORMAL LOW (ref 150–400)
RBC: 4.2 MIL/uL — ABNORMAL LOW (ref 4.22–5.81)
RDW: 13.8 % (ref 11.5–15.5)
WBC: 8.5 10*3/uL (ref 4.0–10.5)
nRBC: 0 % (ref 0.0–0.2)

## 2020-05-26 LAB — RESPIRATORY PANEL BY RT PCR (FLU A&B, COVID)
Influenza A by PCR: NEGATIVE
Influenza B by PCR: NEGATIVE
SARS Coronavirus 2 by RT PCR: NEGATIVE

## 2020-05-26 LAB — COMPREHENSIVE METABOLIC PANEL
ALT: 51 U/L — ABNORMAL HIGH (ref 0–44)
AST: 100 U/L — ABNORMAL HIGH (ref 15–41)
Albumin: 3.9 g/dL (ref 3.5–5.0)
Alkaline Phosphatase: 66 U/L (ref 38–126)
Anion gap: 23 — ABNORMAL HIGH (ref 5–15)
BUN: 15 mg/dL (ref 8–23)
CO2: 16 mmol/L — ABNORMAL LOW (ref 22–32)
Calcium: 8.7 mg/dL — ABNORMAL LOW (ref 8.9–10.3)
Chloride: 98 mmol/L (ref 98–111)
Creatinine, Ser: 1.18 mg/dL (ref 0.61–1.24)
GFR calc Af Amer: >60 mL/min (ref >60)
GFR calc non Af Amer: >60 mL/min (ref >60)
Glucose, Bld: 64 mg/dL — ABNORMAL LOW (ref 70–99)
Potassium: 3.9 mmol/L (ref 3.5–5.1)
Sodium: 137 mmol/L (ref 135–145)
Total Bilirubin: 1.3 mg/dL — ABNORMAL HIGH (ref 0.3–1.2)
Total Protein: 8 g/dL (ref 6.5–8.1)

## 2020-05-26 LAB — BASIC METABOLIC PANEL
Anion gap: 13 (ref 5–15)
BUN: 15 mg/dL (ref 8–23)
CO2: 21 mmol/L — ABNORMAL LOW (ref 22–32)
Calcium: 8.4 mg/dL — ABNORMAL LOW (ref 8.9–10.3)
Chloride: 100 mmol/L (ref 98–111)
Creatinine, Ser: 1.16 mg/dL (ref 0.61–1.24)
GFR calc Af Amer: >60 mL/min (ref >60)
GFR calc non Af Amer: >60 mL/min (ref >60)
Glucose, Bld: 131 mg/dL — ABNORMAL HIGH (ref 70–99)
Potassium: 4 mmol/L (ref 3.5–5.1)
Sodium: 134 mmol/L — ABNORMAL LOW (ref 135–145)

## 2020-05-26 LAB — CBG MONITORING, ED: Glucose-Capillary: 132 mg/dL — ABNORMAL HIGH (ref 70–99)

## 2020-05-26 MED ORDER — METOPROLOL TARTRATE 5 MG/5ML IV SOLN
5.0000 mg | Freq: Once | INTRAVENOUS | Status: AC
  Administered 2020-05-26: 5 mg via INTRAVENOUS
  Filled 2020-05-26: qty 5

## 2020-05-26 MED ORDER — SODIUM CHLORIDE 0.9 % IV BOLUS
1000.0000 mL | Freq: Once | INTRAVENOUS | Status: AC
  Administered 2020-05-26: 1000 mL via INTRAVENOUS

## 2020-05-26 MED ORDER — DEXTROSE 50 % IV SOLN
INTRAVENOUS | Status: AC
  Administered 2020-05-26: 25 mL via INTRAVENOUS
  Filled 2020-05-26: qty 50

## 2020-05-26 MED ORDER — AMLODIPINE BESYLATE 5 MG PO TABS
5.0000 mg | ORAL_TABLET | Freq: Once | ORAL | Status: AC
  Administered 2020-05-26: 5 mg via ORAL
  Filled 2020-05-26: qty 1

## 2020-05-26 MED ORDER — ACETAMINOPHEN 325 MG PO TABS
650.0000 mg | ORAL_TABLET | Freq: Once | ORAL | Status: AC
  Administered 2020-05-26: 650 mg via ORAL
  Filled 2020-05-26: qty 2

## 2020-05-26 MED ORDER — METOPROLOL SUCCINATE ER 25 MG PO TB24
25.0000 mg | ORAL_TABLET | Freq: Every day | ORAL | Status: DC
  Administered 2020-05-26: 25 mg via ORAL
  Filled 2020-05-26: qty 1

## 2020-05-26 MED ORDER — SODIUM CHLORIDE 0.9 % IV SOLN
1.0000 g | Freq: Once | INTRAVENOUS | Status: AC
  Administered 2020-05-26: 1 g via INTRAVENOUS
  Filled 2020-05-26: qty 10

## 2020-05-26 MED ORDER — DEXTROSE 50 % IV SOLN: 25.0000 mL | Freq: Once | INTRAVENOUS | Status: AC

## 2020-05-26 MED ORDER — CEPHALEXIN 500 MG PO CAPS: 500.0000 mg | ORAL_CAPSULE | Freq: Four times a day (QID) | ORAL | 0 refills | Status: DC

## 2020-05-26 NOTE — ED Provider Notes (Signed)
[facility] EMERGENCY DEPARTMENT Provider Note   CSN: [id number] Arrival date & time: 05/26/20  1517     History Chief Complaint  Patient presents with  . Fatigue    [name] is a 69 y.o. male.  Patient complains of cough and fatigue.  The history is provided by the patient, medical records and a significant other.  Cough Cough characteristics:  Non-productive Sputum characteristics:  Nondescript Severity:  Mild Onset quality:  Sudden Timing:  Intermittent Progression:  Waxing and waning Chronicity:  New Smoker: no   Context: not animal exposure   Relieved by:  Nothing Worsened by:  Nothing Associated symptoms: no chest pain, no eye discharge, no headaches and no rash        Past Medical History:  Diagnosis Date  . Anemia    FeDA: GASTRIC POLYPS, B12; TCS 2008 EGD 2009, 2008-HB 11.1 MCV 83.6 CR 1.22, 2009 FERRITIN 102-22  . B12 deficiency   . Carcinoid tumor determined by biopsy of stomach 09/25/2014  . Chronic knee pain   . Diverticulosis of colon    Lower GI bleed 2008  . Essential hypertension   . GERD (gastroesophageal reflux disease)   . Gout   . Hepatomegaly    Hepatic steatosis  . History of alcohol abuse   . History of septic arthritis   . Hypothyroidism   . PUD   . Substance abuse [facility])     Patient Active Problem List   Diagnosis Date Noted  . Colon polyps 12/28/2018  . UTI (urinary tract infection) 09/22/2018  . AKI (acute kidney injury) ([place]) 09/22/2018  . Left inguinal hernia 09/20/2018  . S/P total knee replacement, left 06/29/06 01/27/2018  . Hyperlipidemia 10/23/2016  . Mild intellectual disabilities (CODE) 09/29/2016  . Personal history of gout 09/22/2016  . Literacy level of illiterate 09/22/2016  . S/P TKR (total knee replacement), right 09/18/08 09/22/2016  . Carcinoid tumor determined by biopsy of stomach 04/10/2016  . Tubulovillous adenoma of colon 09/12/2015  . Neuroendocrine tumor 09/25/2014  . Elevated LFTs 07/17/2014   . Iron deficiency anemia 07/29/2010  . Fatty liver 07/29/2010  . Alcohol abuse 11/30/2008  . Osteoarthrosis involving lower leg 08/20/2008  . Essential hypertension 10/20/2006  . B12 deficiency 08/15/2006  . GERD 08/15/2006  . IBS 08/15/2006  . BPH without obstruction/lower urinary tract symptoms 08/15/2006  . OSTEOARTHRITIS 08/15/2006  . DEGENERATION, DISC NOS 08/15/2006    Past Surgical History:  Procedure Laterality Date  . BIOPSY  08/14/2014   Procedure: BIOPSY;  Surgeon: Danie Binder, MD;  Location: [facility];  Service: Endoscopy;;  . BIOPSY  03/12/2015   Procedure: BIOPSY;  Surgeon: Danie Binder, MD;  Location: [facility];  Service: Endoscopy;;  . BIOPSY  04/21/2016   Procedure: BIOPSY;  Surgeon: Danie Binder, MD;  [facility];  Service: Endoscopy;;  bx's of antrum, body of stomach, fundus, and cardia  . CHOLECYSTECTOMY    . COLONOSCOPY  2008 [facility] DJ   LGIB 2o to TICS, prep good  . COLONOSCOPY WITH PROPOFOL N/A 08/14/2014   SLF: 1. Four large colon polyps removed. 2. The left colon is redundant 3. Moderate diverticulosis throughout teh entire examined colon  . COLONOSCOPY WITH PROPOFOL N/A 11/06/2014   SLF: 8 small polyps removed. One large pedunculated polyp removed from the ascending colon, tubulovillous and tubular adenomas. Next colonoscopy March 2019  . COLONOSCOPY WITH PROPOFOL N/A 01/04/2018   Procedure: COLONOSCOPY WITH PROPOFOL;  Surgeon: Danie Binder, MD;  [facility];  Service: Endoscopy;  Laterality: N/A;  10:00am  . ESOPHAGOGASTRODUODENOSCOPY  12/08/2007   WCB:JSEG gastric polyps seen in the cardia and body of the stomach/Normal esophagus without evidence of Barrett's, mass, erosion/Normal duodenal bulb and second portion of the duodenum. Benign bx.  . ESOPHAGOGASTRODUODENOSCOPY (EGD) WITH PROPOFOL N/A 08/14/2014   SLF: 1. Heme postive stools due to gastric and colon polyps 2. Multiple gastric   . ESOPHAGOGASTRODUODENOSCOPY (EGD) WITH PROPOFOL  N/A 03/12/2015   SLF: Multiple gastric nodules seen in gasric body/antrum. 2. Non-erosive gastritis ( inflammation) was found in the gastric antrum.   . ESOPHAGOGASTRODUODENOSCOPY (EGD) WITH PROPOFOL N/A 10/08/2015   Procedure: ESOPHAGOGASTRODUODENOSCOPY (EGD) WITH PROPOFOL;  Surgeon: Danie Binder, MD;  [facility];  Service: Endoscopy;  Laterality: N/A;  0945  . ESOPHAGOGASTRODUODENOSCOPY (EGD) WITH PROPOFOL N/A 04/21/2016   Procedure: ESOPHAGOGASTRODUODENOSCOPY (EGD) WITH PROPOFOL;  Surgeon: Danie Binder, MD;  [facility];  Service: Endoscopy;  Laterality: N/A;  730   . HAND SURGERY Right    fracture repair with plates  . INGUINAL HERNIA REPAIR Left 11/09/2018   Procedure: HERNIA REPAIR INGUINAL ADULT WITH MESH;  Surgeon: Virl Cagey, MD;  Location: [facility];  Service: General;  Laterality: Left;  . JOINT REPLACEMENT Bilateral    knees  . POLYPECTOMY  08/14/2014   Procedure: POLYPECTOMY;  Surgeon: Danie Binder, MD;  Location: [facility];  Service: Endoscopy;;  . POLYPECTOMY N/A 11/06/2014   Procedure: POLYPECTOMY;  Surgeon: Danie Binder, MD;  Location: [facility];  Service: Endoscopy;  Laterality: N/A;  Transverse Colon x3, Ascending Colon x2, Descending Colon x3  . REPLACEMENT TOTAL KNEE BILATERAL    . UPPER GASTROINTESTINAL ENDOSCOPY  APR 2009   INFLAMED HYPERPLASTIC POLYPS, CHRONIC GASTRITIS       Family History  Problem Relation Age of Onset  . Diabetes Mother   . Renal Disease Mother        failure  . Diabetes Father        'old age'  . Hypertension Sister   . Hypertension Brother   . Diabetes Brother   . Diabetes Sister   . Diabetes Sister   . Diabetes Brother   . Hypertension Brother   . Diabetes Brother        right BKA  . Kidney failure Brother        on dialysis  . Kidney failure Brother        on dialysis  . Diabetes Brother        bilateral BKA  . Colon polyps Neg Hx   . Colon cancer Neg Hx   . Pancreatic disease Neg Hx     Social  History   Tobacco Use  . Smoking status: Never Smoker  . Smokeless tobacco: Never Used  . Tobacco comment: Quit x 10 years/ never smoked on regular basis  Vaping Use  . Vaping Use: Never used  Substance Use Topics  . Alcohol use: Yes    Alcohol/week: 2.0 standard drinks    Types: 2 Shots of liquor per week    Comment: drinks on weekends, gin/vodka 1/5th.  . Drug use: No    Home Medications Prior to Admission medications   Medication Sig Start Date End Date Taking? Authorizing Provider  amLODipine (NORVASC) 5 MG tablet TAKE 1 TABLET BY MOUTH ONCE DAILY. 03/09/19  Yes Branch, Alphonse Guild, MD  atorvastatin (LIPITOR) 20 MG tablet Take 1 tablet (20 mg total) by mouth daily.  10/23/16  Yes Raylene Everts, MD  clindamycin (CLINDAGEL) 1 % gel Apply 1 application topically daily. Applies to back of neck   Yes [provider]  colchicine 0.6 MG tablet TAKE 1 TABLET BY MOUTH 3 TIMES DAILY X5 DAYS FOR GOUT PAIN. 03/26/20  Yes Sanjuana Kava, MD  HYDROcodone-acetaminophen (NORCO/VICODIN) 5-325 MG tablet One tablet by mouth every six hours as needed for pain.  Must last 30 days. 05/16/20  Yes Sanjuana Kava, MD  iron polysaccharides (NIFEREX) 150 MG capsule Take 1 capsule (150 mg total) by mouth daily. 05/20/20  Yes Derek Jack, MD  levothyroxine (SYNTHROID, LEVOTHROID) 200 MCG tablet Take 1 tablet (200 mcg total) by mouth daily before breakfast. 09/24/18  Yes Emokpae, Courage, MD  metoprolol succinate (TOPROL-XL) 25 MG 24 hr tablet Take 1.5 tablets (37.5 mg total) by mouth daily. 02/19/20  Yes Branch, Alphonse Guild, MD  omeprazole (PRILOSEC) 20 MG capsule TAKE 1 CAPSULE BY MOUTH ONCE DAILY. Patient taking differently: Take 20 mg by mouth daily.  11/19/17  Yes Carlis Stable, NP  sildenafil (VIAGRA) 100 MG tablet Take 50 mg by mouth daily as needed. 09/28/19  Yes [provider]  tamsulosin (FLOMAX) 0.4 MG CAPS capsule Take 1 capsule (0.4 mg total) by mouth daily after supper. Patient  taking differently: Take 0.4 mg by mouth daily.  09/24/18  Yes Emokpae, Courage, MD  cephALEXin (KEFLEX) 500 MG capsule Take 1 capsule (500 mg total) by mouth 4 (four) times daily. 05/26/20   Milton Ferguson, MD  predniSONE (STERAPRED UNI-PAK 21 TAB) 5 MG (21) TBPK tablet Take 6 pills first day; 5 pills second day; 4 pills third day; 3 pills fourth day; 2 pills next day and 1 pill last day. 01/09/20   Sanjuana Kava, MD  ULORIC 40 MG tablet TAKE 1 TABLET BY MOUTH ONCE A DAY. Patient taking differently: Take 40 mg by mouth daily.  12/21/17   Raylene Everts, MD    Allergies    Aspirin  Review of Systems   Review of Systems  Constitutional: Negative for appetite change and fatigue.  HENT: Negative for congestion, ear discharge and sinus pressure.   Eyes: Negative for discharge.  Respiratory: Positive for cough.   Cardiovascular: Negative for chest pain.  Gastrointestinal: Negative for abdominal pain and diarrhea.  Genitourinary: Negative for frequency and hematuria.  Musculoskeletal: Negative for back pain.  Skin: Negative for rash.  Neurological: Negative for seizures and headaches.  Psychiatric/Behavioral: Negative for hallucinations.    Physical Exam Updated Vital Signs BP (!) 167/93   Pulse (!) 103   Temp 98.2 F (36.8 C) (Oral)   Resp 19   Ht 6\' 3"  (1.905 m)   Wt 110.7 kg   SpO2 94%   BMI 30.50 kg/m   Physical Exam Vitals and nursing note reviewed.  Constitutional:      Appearance: He is well-developed.  HENT:     Head: Normocephalic.     Nose: Nose normal.  Eyes:     General: No scleral icterus.    Conjunctiva/sclera: Conjunctivae normal.  Neck:     Thyroid: No thyromegaly.  Cardiovascular:     Rate and Rhythm: Tachycardia present. Rhythm irregular.     Heart sounds: No murmur heard.  No friction rub. No gallop.   Pulmonary:     Breath sounds: No stridor. No wheezing or rales.  Chest:     Chest wall: No tenderness.  Abdominal:     General: There is no  distension.  Tenderness: There is no abdominal tenderness. There is no rebound.  Musculoskeletal:        General: Normal range of motion.     Cervical back: Neck supple.  Lymphadenopathy:     Cervical: No cervical adenopathy.  Skin:    Findings: No erythema or rash.  Neurological:     Mental Status: He is alert and oriented to person, place, and time.     Motor: No abnormal muscle tone.     Coordination: Coordination normal.  Psychiatric:        Behavior: Behavior normal.     ED Results / Procedures / Treatments   Labs (all labs ordered are listed, but only abnormal results are displayed) Labs Reviewed  CBC WITH DIFFERENTIAL/PLATELET - Abnormal; Notable for the following components:      Result Value   RBC 4.20 (*)    MCV 103.8 (*)    Platelets 113 (*)    All other components within normal limits  COMPREHENSIVE METABOLIC PANEL - Abnormal; Notable for the following components:   CO2 16 (*)    Glucose, Bld 64 (*)    Calcium 8.7 (*)    AST 100 (*)    ALT 51 (*)    Total Bilirubin 1.3 (*)    Anion gap 23 (*)    All other components within normal limits  URINALYSIS, ROUTINE W REFLEX MICROSCOPIC - Abnormal; Notable for the following components:   APPearance CLOUDY (*)    Hgb urine dipstick MODERATE (*)    Ketones, ur 20 (*)    Protein, ur 30 (*)    Nitrite POSITIVE (*)    Leukocytes,Ua SMALL (*)    WBC, UA >50 (*)    Bacteria, UA RARE (*)    All other components within normal limits  CBG MONITORING, ED - Abnormal; Notable for the following components:   Glucose-Capillary 132 (*)    All other components within normal limits  RESPIRATORY PANEL BY RT PCR (FLU A&B, COVID)  URINE CULTURE    EKG EKG Interpretation  Date/Time:  Sunday May 26 2020 15:30:29 EDT Ventricular Rate:  114 PR Interval:    QRS Duration: 84 QT Interval:  329 QTC Calculation: 453 R Axis:   -6 Text Interpretation: Sinus tachycardia Multiple premature complexes, vent & supraven  Nonspecific T abnormalities, lateral leads Confirmed by Milton Ferguson [phone]) on 05/26/2020 7:48:44 PM   Radiology DG Chest Portable 1 View  Result Date: 05/26/2020 CLINICAL DATA:  Cough and fever EXAM: PORTABLE CHEST 1 VIEW COMPARISON:  05/11/2019 FINDINGS: Cardiac shadow is within normal limits. Lungs are well aerated bilaterally. Known left-sided nodule is not well appreciated due to its size. No other focal abnormality is noted. IMPRESSION: No active disease. Electronically Signed   By: Inez Catalina M.D.   On: 05/26/2020 15:58    Procedures Procedures (including critical care time)  Medications Ordered in ED Medications  acetaminophen (TYLENOL) tablet 650 mg (650 mg Oral Given 05/26/20 1950)  sodium chloride 0.9 % bolus 1,000 mL (1,000 mLs Intravenous New Bag/Given 05/26/20 2018)  cefTRIAXone (ROCEPHIN) 1 g in sodium chloride 0.9 % 100 mL IVPB (1 g Intravenous New Bag/Given 05/26/20 2018)  dextrose 50 % solution 25 mL (25 mLs Intravenous Given 05/26/20 2009)    ED Course  I have reviewed the triage vital signs and the nursing notes.  Pertinent labs & imaging results that were available during my care of the patient were reviewed by me and considered in my medical decision making (see  chart for details).    MDM Rules/Calculators/A&P                          Patient with urinary tract infection.  He is placed on Keflex and also given Rocephin in the emergency department and will follow up with PCP      This patient presents to the ED for concern of cough, this involves an extensive number of treatment options, and is a complaint that carries with it a high risk of complications and morbidity.  The differential diagnosis includes pneumonia COPD   Lab Tests:   I Ordered, reviewed, and interpreted labs, which included CBC and chemistries and urinalysis.  Urinalysis shows UTI  Medicines ordered:   I ordered medication Rocephin for UTI  Imaging Studies ordered:   I ordered  imaging studies which included chest x-ray  I independently visualized and interpreted imaging which showed unremarkable  Additional history obtained:   Additional history obtained from record  Previous records obtained and reviewed.  Consultations Obtained:     Reevaluation:  After the interventions stated above, I reevaluated the patient and found improved  Critical Interventions:  .   Final Clinical Impression(s) / ED Diagnoses Final diagnoses:  Acute cystitis without hematuria    Rx / DC Orders ED Discharge Orders         Ordered    cephALEXin (KEFLEX) 500 MG capsule  4 times daily        05/26/20 2123           Milton Ferguson, MD 05/26/20 2217

## 2020-05-26 NOTE — ED Triage Notes (Signed)
Pt c/o fatigue, cough, generalized weakness & malaise since yesterday. Subjective fever. Denies COVID exposure. Did not receive COVID vaccine.

## 2020-05-26 NOTE — Discharge Instructions (Addendum)
Make sure you take all your medicines.  Get the prescription for the antibiotic and start taking it tomorrow.  Follow-up with your doctor later this week even if it is just a computer visit.

## 2020-05-29 LAB — URINE CULTURE: Culture: >=100000 — AB

## 2020-05-30 ENCOUNTER — Telehealth: Payer: Self-pay | Admitting: *Deleted

## 2020-05-30 NOTE — Telephone Encounter (Signed)
Post ED Visit - Positive Culture Follow-up  Culture report reviewed by antimicrobial stewardship pharmacist: [place] Team []  Elenor Quinones, Pharm.D. []  Heide Guile, Pharm.D., BCPS AQ-ID []  Parks Neptune, Pharm.D., BCPS []  Alycia Rossetti, Pharm.D., BCPS []  [place], Pharm.D., BCPS, AAHIVP []  Legrand Como, Pharm.D., BCPS, AAHIVP []  Salome Arnt, PharmD, BCPS []  Johnnette Gourd, PharmD, BCPS []  Hughes Better, PharmD, BCPS []  Leeroy Cha, PharmD []  Laqueta Linden, PharmD, BCPS []  Albertina Parr, PharmD  [place] Team []  Leodis Sias, PharmD []  Lindell Spar, PharmD []  Royetta Asal, PharmD []  Graylin Shiver, Rph []  Rema Fendt) Glennon Mac, PharmD []  Arlyn Dunning, PharmD []  Netta Cedars, PharmD []  Dia Sitter, PharmD []  Leone Haven, PharmD []  Gretta Arab, PharmD []  Theodis Shove, PharmD []  Peggyann Juba, PharmD []  Reuel Boom, PharmD   Positive urine culture Treated with Cephalexin.  Follow up completed via phone.  Patient states no UTI symptoms, feeling much better and no further patient follow-up is required at this time.  Harlon Flor [facility] 05/30/2020, 9:53 AM

## 2020-05-30 NOTE — Progress Notes (Signed)
ED Antimicrobial Stewardship Positive Culture Follow Up   [name] is an 69 y.o. male who presented to [facility] on 05/26/2020 with a chief complaint of  Chief Complaint  Patient presents with  . Fatigue    Recent Results (from the past 720 hour(s))  Respiratory Panel by RT PCR (Flu A&B, Covid) - Nasopharyngeal Swab     Status: None   Collection Time: 05/26/20  3:28 PM   Specimen: Nasopharyngeal Swab  Result Value Ref Range Status   SARS Coronavirus 2 by RT PCR NEGATIVE NEGATIVE Final    Comment: (NOTE) SARS-CoV-2 target nucleic acids are NOT DETECTED.  The SARS-CoV-2 RNA is generally detectable in upper respiratoy specimens during the acute phase of infection. The lowest concentration of SARS-CoV-2 viral copies this assay can detect is 131 copies/mL. A negative result does not preclude SARS-Cov-2 infection and should not be used as the sole basis for treatment or other patient management decisions. A negative result may occur with  improper specimen collection/handling, submission of specimen other than nasopharyngeal swab, presence of viral mutation(s) within the areas targeted by this assay, and inadequate number of viral copies (<131 copies/mL). A negative result must be combined with clinical observations, patient history, and epidemiological information. The expected result is Negative.  Fact Sheet for Patients:  PinkCheek.be  Fact Sheet for Healthcare Providers:  GravelBags.it  This test is no t yet approved or cleared by the [place] FDA and  has been authorized for detection and/or diagnosis of SARS-CoV-2 by FDA under an Emergency Use Authorization (EUA). This EUA will remain  in effect (meaning this test can be used) for the duration of the COVID-19 declaration under Section 564(b)(1) of the Act, 21 U.S.C. section 360bbb-3(b)(1), unless the authorization is terminated or revoked sooner.      Influenza A by PCR NEGATIVE NEGATIVE Final   Influenza B by PCR NEGATIVE NEGATIVE Final    Comment: (NOTE) The Xpert Xpress SARS-CoV-2/FLU/RSV assay is intended as an aid in  the diagnosis of influenza from Nasopharyngeal swab specimens and  should not be used as a sole basis for treatment. Nasal washings and  aspirates are unacceptable for Xpert Xpress SARS-CoV-2/FLU/RSV  testing.  Fact Sheet for Patients: PinkCheek.be  Fact Sheet for Healthcare Providers: GravelBags.it  This test is not yet approved or cleared by the [place] FDA and  has been authorized for detection and/or diagnosis of SARS-CoV-2 by  FDA under an Emergency Use Authorization (EUA). This EUA will remain  in effect (meaning this test can be used) for the duration of the  Covid-19 declaration under Section 564(b)(1) of the Act, 21  U.S.C. section 360bbb-3(b)(1), unless the authorization is  terminated or revoked. Performed at [facility], [address], [place], [place] [address]   Urine Culture     Status: Abnormal   Collection Time: 05/26/20  7:47 PM   Specimen: Urine, Clean Catch  Result Value Ref Range Status   Specimen Description   Final    URINE, CLEAN CATCH Performed at [facility], [address] [place], [place] [address]    Special Requests   Final    NONE Performed at [facility], [address], [place], [place] [address]    Culture >=100,000 COLONIES/mL ESCHERICHIA COLI (A)  Final   Report Status 05/29/2020 FINAL  Final   Organism ID, Bacteria ESCHERICHIA COLI (A)  Final      Susceptibility   Escherichia coli - MIC*    AMPICILLIN >=32 RESISTANT Resistant  CEFAZOLIN 32 INTERMEDIATE Intermediate     CEFTRIAXONE 1 SENSITIVE Sensitive     CIPROFLOXACIN <=0.25 SENSITIVE Sensitive     GENTAMICIN <=1 SENSITIVE Sensitive     IMIPENEM <=0.25 SENSITIVE Sensitive     NITROFURANTOIN <=16 SENSITIVE Sensitive     TRIMETH/SULFA <=20  SENSITIVE Sensitive     AMPICILLIN/SULBACTAM >=32 RESISTANT Resistant     PIP/TAZO 8 SENSITIVE Sensitive     * >=100,000 COLONIES/mL ESCHERICHIA COLI    [x]  Treated with cephalexin, organism resistant to prescribed antimicrobial []  Patient discharged originally without antimicrobial agent and treatment is now indicated  New antibiotic prescription: Symptom check - If pt has UTI symptoms and is not improving, DC cephalexin and start cefdinir 300mg  PO BID x 5 days  ED Provider: Arlean Hopping, PA-C   [place], Rande Lawman 05/30/2020, 8:30 AM Clinical Pharmacist Monday - Friday phone -  [phone] Saturday - Sunday phone - [phone]

## 2020-06-04 ENCOUNTER — Other Ambulatory Visit (HOSPITAL_COMMUNITY): Payer: Self-pay

## 2020-06-04 DIAGNOSIS — I10 Essential (primary) hypertension: Secondary | ICD-10-CM | POA: Diagnosis not present

## 2020-06-04 DIAGNOSIS — E785 Hyperlipidemia, unspecified: Secondary | ICD-10-CM | POA: Diagnosis not present

## 2020-06-04 DIAGNOSIS — N390 Urinary tract infection, site not specified: Secondary | ICD-10-CM | POA: Diagnosis not present

## 2020-06-04 DIAGNOSIS — D509 Iron deficiency anemia, unspecified: Secondary | ICD-10-CM | POA: Diagnosis not present

## 2020-06-04 DIAGNOSIS — E039 Hypothyroidism, unspecified: Secondary | ICD-10-CM | POA: Diagnosis not present

## 2020-06-04 DIAGNOSIS — D3A8 Other benign neuroendocrine tumors: Secondary | ICD-10-CM

## 2020-06-04 DIAGNOSIS — R918 Other nonspecific abnormal finding of lung field: Secondary | ICD-10-CM

## 2020-06-04 DIAGNOSIS — D500 Iron deficiency anemia secondary to blood loss (chronic): Secondary | ICD-10-CM

## 2020-06-05 ENCOUNTER — Inpatient Hospital Stay (HOSPITAL_COMMUNITY): Payer: Medicare Other | Attending: Hematology

## 2020-06-05 ENCOUNTER — Ambulatory Visit (HOSPITAL_COMMUNITY): Payer: Medicare Other

## 2020-06-12 ENCOUNTER — Ambulatory Visit (HOSPITAL_COMMUNITY): Payer: Medicare Other | Admitting: Hematology

## 2020-06-13 ENCOUNTER — Telehealth: Payer: Self-pay | Admitting: Orthopaedic Surgery

## 2020-06-13 MED ORDER — HYDROCODONE-ACETAMINOPHEN 5-325 MG PO TABS
ORAL_TABLET | ORAL | 0 refills | Status: DC
Start: 2020-06-13 — End: 2020-07-17

## 2020-06-13 NOTE — Telephone Encounter (Signed)
Patient requests refill: HYDROcodone-acetaminophen (NORCO/VICODIN) 5-325 MG tablet 56 tablet  -[facility]

## 2020-06-14 DIAGNOSIS — N390 Urinary tract infection, site not specified: Secondary | ICD-10-CM | POA: Diagnosis not present

## 2020-06-19 DIAGNOSIS — E559 Vitamin D deficiency, unspecified: Secondary | ICD-10-CM | POA: Diagnosis not present

## 2020-06-19 DIAGNOSIS — I10 Essential (primary) hypertension: Secondary | ICD-10-CM | POA: Diagnosis not present

## 2020-06-19 DIAGNOSIS — I471 Supraventricular tachycardia: Secondary | ICD-10-CM | POA: Diagnosis not present

## 2020-06-19 DIAGNOSIS — N390 Urinary tract infection, site not specified: Secondary | ICD-10-CM | POA: Diagnosis not present

## 2020-06-19 DIAGNOSIS — E785 Hyperlipidemia, unspecified: Secondary | ICD-10-CM | POA: Diagnosis not present

## 2020-06-27 ENCOUNTER — Ambulatory Visit (INDEPENDENT_AMBULATORY_CARE_PROVIDER_SITE_OTHER): Payer: Medicare Other | Admitting: Orthopaedic Surgery

## 2020-06-27 ENCOUNTER — Encounter: Payer: Self-pay | Admitting: Orthopaedic Surgery

## 2020-06-27 ENCOUNTER — Other Ambulatory Visit: Payer: Self-pay

## 2020-06-27 VITALS — BP 141/87 | HR 84 | Ht 75.0 in | Wt 233.0 lb

## 2020-06-27 DIAGNOSIS — G894 Chronic pain syndrome: Secondary | ICD-10-CM

## 2020-06-27 DIAGNOSIS — M1A0410 Idiopathic chronic gout, right hand, without tophus (tophi): Secondary | ICD-10-CM | POA: Diagnosis not present

## 2020-06-27 NOTE — Progress Notes (Signed)
Patient [name], male DOB:05-23-1951, 69 y.o. [id number]  Chief Complaint  Patient presents with  . Hand Pain    follow up gout    HPI  [name] is a 69 y.o. male who has gout of the hands, more on the right.  He has some swelling.  He is taking his medicine.  He has no new attacks.   Body mass index is 29.12 kg/m.  ROS  Review of Systems  HENT: Negative for congestion.   Respiratory: Negative for cough and shortness of breath.   Cardiovascular: Negative for chest pain and leg swelling.  Endocrine: Positive for cold intolerance.  Musculoskeletal: Positive for arthralgias and joint swelling.  Allergic/Immunologic: Positive for environmental allergies.  All other systems reviewed and are negative.   All other systems reviewed and are negative.  The following is a summary of the past history medically, past history surgically, known current medicines, social history and family history.  This information is gathered electronically by the computer from prior information and documentation.  I review this each visit and have found including this information at this point in the chart is beneficial and informative.    Past Medical History:  Diagnosis Date  . Anemia    FeDA: GASTRIC POLYPS, B12; TCS 2008 EGD 2009, 2008-HB 11.1 MCV 83.6 CR 1.22, 2009 FERRITIN 102-22  . B12 deficiency   . Carcinoid tumor determined by biopsy of stomach 09/25/2014  . Chronic knee pain   . Diverticulosis of colon    Lower GI bleed 2008  . Essential hypertension   . GERD (gastroesophageal reflux disease)   . Gout   . Hepatomegaly    Hepatic steatosis  . History of alcohol abuse   . History of septic arthritis   . Hypothyroidism   . PUD   . Substance abuse [facility])     Past Surgical History:  Procedure Laterality Date  . BIOPSY  08/14/2014   Procedure: BIOPSY;  Surgeon: Danie Binder, MD;  Location: [facility];  Service: Endoscopy;;  . BIOPSY  03/12/2015   Procedure: BIOPSY;  Surgeon:  Danie Binder, MD;  Location: [facility];  Service: Endoscopy;;  . BIOPSY  04/21/2016   Procedure: BIOPSY;  Surgeon: Danie Binder, MD;  Location: [facility];  Service: Endoscopy;;  bx's of antrum, body of stomach, fundus, and cardia  . CHOLECYSTECTOMY    . COLONOSCOPY  2008 [facility] DJ   LGIB 2o to TICS, prep good  . COLONOSCOPY WITH PROPOFOL N/A 08/14/2014   SLF: 1. Four large colon polyps removed. 2. The left colon is redundant 3. Moderate diverticulosis throughout teh entire examined colon  . COLONOSCOPY WITH PROPOFOL N/A 11/06/2014   SLF: 8 small polyps removed. One large pedunculated polyp removed from the ascending colon, tubulovillous and tubular adenomas. Next colonoscopy March 2019  . COLONOSCOPY WITH PROPOFOL N/A 01/04/2018   Procedure: COLONOSCOPY WITH PROPOFOL;  Surgeon: Danie Binder, MD;  [facility]: [facility];  Service: Endoscopy;  Laterality: N/A;  10:00am  . ESOPHAGOGASTRODUODENOSCOPY  12/08/2007   XBM:WUXL gastric polyps seen in the cardia and body of the stomach/Normal esophagus without evidence of Barrett's, mass, erosion/Normal duodenal bulb and second portion of the duodenum. Benign bx.  . ESOPHAGOGASTRODUODENOSCOPY (EGD) WITH PROPOFOL N/A 08/14/2014   SLF: 1. Heme postive stools due to gastric and colon polyps 2. Multiple gastric   . ESOPHAGOGASTRODUODENOSCOPY (EGD) WITH PROPOFOL N/A 03/12/2015   SLF: Multiple gastric nodules seen in gasric body/antrum. 2. Non-erosive gastritis ( inflammation)  was found in the gastric antrum.   . ESOPHAGOGASTRODUODENOSCOPY (EGD) WITH PROPOFOL N/A 10/08/2015   Procedure: ESOPHAGOGASTRODUODENOSCOPY (EGD) WITH PROPOFOL;  Surgeon: Danie Binder, MD;  [facility];  Service: Endoscopy;  Laterality: N/A;  0945  . ESOPHAGOGASTRODUODENOSCOPY (EGD) WITH PROPOFOL N/A 04/21/2016   Procedure: ESOPHAGOGASTRODUODENOSCOPY (EGD) WITH PROPOFOL;  Surgeon: Danie Binder, MD;  [facility]: [facility];  Service: Endoscopy;  Laterality: N/A;  730   .  HAND SURGERY Right    fracture repair with plates  . INGUINAL HERNIA REPAIR Left 11/09/2018   Procedure: HERNIA REPAIR INGUINAL ADULT WITH MESH;  Surgeon: Virl Cagey, MD;  Location: [facility];  Service: General;  Laterality: Left;  . JOINT REPLACEMENT Bilateral    knees  . POLYPECTOMY  08/14/2014   Procedure: POLYPECTOMY;  Surgeon: Danie Binder, MD;  Location: [facility];  Service: Endoscopy;;  . POLYPECTOMY N/A 11/06/2014   Procedure: POLYPECTOMY;  Surgeon: Danie Binder, MD;  Location: [facility];  Service: Endoscopy;  Laterality: N/A;  Transverse Colon x3, Ascending Colon x2, Descending Colon x3  . REPLACEMENT TOTAL KNEE BILATERAL    . UPPER GASTROINTESTINAL ENDOSCOPY  APR 2009   INFLAMED HYPERPLASTIC POLYPS, CHRONIC GASTRITIS    Family History  Problem Relation Age of Onset  . Diabetes Mother   . Renal Disease Mother        failure  . Diabetes Father        'old age'  . Hypertension Sister   . Hypertension Brother   . Diabetes Brother   . Diabetes Sister   . Diabetes Sister   . Diabetes Brother   . Hypertension Brother   . Diabetes Brother        right BKA  . Kidney failure Brother        on dialysis  . Kidney failure Brother        on dialysis  . Diabetes Brother        bilateral BKA  . Colon polyps Neg Hx   . Colon cancer Neg Hx   . Pancreatic disease Neg Hx     Social History Social History   Tobacco Use  . Smoking status: Never Smoker  . Smokeless tobacco: Never Used  . Tobacco comment: Quit x 10 years/ never smoked on regular basis  Vaping Use  . Vaping Use: Never used  Substance Use Topics  . Alcohol use: Yes    Alcohol/week: 2.0 standard drinks    Types: 2 Shots of liquor per week    Comment: drinks on weekends, gin/vodka 1/5th.  . Drug use: No    Allergies  Allergen Reactions  . Aspirin Other (See Comments)    REACTION: Diverticular Bleed    Current Outpatient Medications  Medication Sig Dispense Refill  . amLODipine (NORVASC) 5 MG tablet  TAKE 1 TABLET BY MOUTH ONCE DAILY. 90 tablet 3  . atorvastatin (LIPITOR) 20 MG tablet Take 1 tablet (20 mg total) by mouth daily. 90 tablet 3  . cephALEXin (KEFLEX) 500 MG capsule Take 1 capsule (500 mg total) by mouth 4 (four) times daily. 28 capsule 0  . clindamycin (CLINDAGEL) 1 % gel Apply 1 application topically daily. Applies to back of neck    . colchicine 0.6 MG tablet TAKE 1 TABLET BY MOUTH 3 TIMES DAILY X5 DAYS FOR GOUT PAIN. 15 tablet 4  . HYDROcodone-acetaminophen (NORCO/VICODIN) 5-325 MG tablet One tablet by mouth every six hours as needed for pain.  Must last 30 days.  56 tablet 0  . iron polysaccharides (NIFEREX) 150 MG capsule Take 1 capsule (150 mg total) by mouth daily. 90 capsule 1  . levothyroxine (SYNTHROID, LEVOTHROID) 200 MCG tablet Take 1 tablet (200 mcg total) by mouth daily before breakfast. 90 tablet 3  . metoprolol succinate (TOPROL-XL) 25 MG 24 hr tablet Take 1.5 tablets (37.5 mg total) by mouth daily. 45 tablet 6  . omeprazole (PRILOSEC) 20 MG capsule TAKE 1 CAPSULE BY MOUTH ONCE DAILY. (Patient taking differently: Take 20 mg by mouth daily. ) 30 capsule 3  . sildenafil (VIAGRA) 100 MG tablet Take 50 mg by mouth daily as needed.    . tamsulosin (FLOMAX) 0.4 MG CAPS capsule Take 1 capsule (0.4 mg total) by mouth daily after supper. (Patient taking differently: Take 0.4 mg by mouth daily. ) 30 capsule 3  . ULORIC 40 MG tablet TAKE 1 TABLET BY MOUTH ONCE A DAY. (Patient taking differently: Take 40 mg by mouth daily. ) 90 tablet 0   No current facility-administered medications for this visit.     Physical Exam  Blood pressure (!) 141/87, pulse 84, height 6\' 3"  (1.905 m), weight 233 lb (105.7 kg).  Constitutional: overall normal hygiene, normal nutrition, well developed, normal grooming, normal body habitus. Assistive device:cane  Musculoskeletal: gait and station Limp right, muscle tone and strength are normal, no tremors or atrophy is present.  .  Neurological:  coordination overall normal.  Deep tendon reflex/nerve stretch intact.  Sensation normal.  Cranial nerves II-XII intact.   Skin:   Normal overall no scars, lesions, ulcers or rashes. No psoriasis.  Psychiatric: Alert and oriented x 3.  Recent memory intact, remote memory unclear.  Normal mood and affect. Well groomed.  Good eye contact.  Cardiovascular: overall no swelling, no varicosities, no edema bilaterally, normal temperatures of the legs and arms, no clubbing, cyanosis and good capillary refill.  He has swelling of the right wrist, dorsally.  NV intact. ROM is full.  He has no redness.    Lymphatic: palpation is normal.  All other systems reviewed and are negative   The patient has been educated about the nature of the problem(s) and counseled on treatment options.  The patient appeared to understand what I have discussed and is in agreement with it.  Encounter Diagnoses  Name Primary?  . 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 Chronic pain disorder     PLAN Call if any problems.  Precautions discussed.  Continue current medications.   Return to clinic 3 months   Electronically Signed Sanjuana Kava, MD 10/28/20219:50 AM

## 2020-07-08 ENCOUNTER — Other Ambulatory Visit: Payer: Self-pay

## 2020-07-08 ENCOUNTER — Inpatient Hospital Stay (HOSPITAL_COMMUNITY): Payer: Medicare Other | Attending: Hematology

## 2020-07-08 DIAGNOSIS — R911 Solitary pulmonary nodule: Secondary | ICD-10-CM | POA: Insufficient documentation

## 2020-07-08 DIAGNOSIS — E039 Hypothyroidism, unspecified: Secondary | ICD-10-CM | POA: Insufficient documentation

## 2020-07-08 DIAGNOSIS — L259 Unspecified contact dermatitis, unspecified cause: Secondary | ICD-10-CM | POA: Diagnosis not present

## 2020-07-08 DIAGNOSIS — I10 Essential (primary) hypertension: Secondary | ICD-10-CM | POA: Diagnosis not present

## 2020-07-08 DIAGNOSIS — D500 Iron deficiency anemia secondary to blood loss (chronic): Secondary | ICD-10-CM

## 2020-07-08 DIAGNOSIS — C7A00 Malignant carcinoid tumor of unspecified site: Secondary | ICD-10-CM | POA: Diagnosis not present

## 2020-07-08 DIAGNOSIS — Z8719 Personal history of other diseases of the digestive system: Secondary | ICD-10-CM | POA: Insufficient documentation

## 2020-07-08 DIAGNOSIS — R7989 Other specified abnormal findings of blood chemistry: Secondary | ICD-10-CM | POA: Diagnosis not present

## 2020-07-08 DIAGNOSIS — Z79899 Other long term (current) drug therapy: Secondary | ICD-10-CM | POA: Diagnosis not present

## 2020-07-08 DIAGNOSIS — D3A8 Other benign neuroendocrine tumors: Secondary | ICD-10-CM

## 2020-07-08 DIAGNOSIS — R918 Other nonspecific abnormal finding of lung field: Secondary | ICD-10-CM

## 2020-07-08 LAB — COMPREHENSIVE METABOLIC PANEL
ALT: 29 U/L (ref 0–44)
AST: 45 U/L — ABNORMAL HIGH (ref 15–41)
Albumin: 3.7 g/dL (ref 3.5–5.0)
Alkaline Phosphatase: 49 U/L (ref 38–126)
Anion gap: 10 (ref 5–15)
BUN: 14 mg/dL (ref 8–23)
CO2: 28 mmol/L (ref 22–32)
Calcium: 9 mg/dL (ref 8.9–10.3)
Chloride: 95 mmol/L — ABNORMAL LOW (ref 98–111)
Creatinine, Ser: 1.16 mg/dL (ref 0.61–1.24)
GFR, Estimated: >60 mL/min (ref >60)
Glucose, Bld: 108 mg/dL — ABNORMAL HIGH (ref 70–99)
Potassium: 3.8 mmol/L (ref 3.5–5.1)
Sodium: 133 mmol/L — ABNORMAL LOW (ref 135–145)
Total Bilirubin: 0.9 mg/dL (ref 0.3–1.2)
Total Protein: 7.7 g/dL (ref 6.5–8.1)

## 2020-07-08 LAB — VITAMIN B12: Vitamin B-12: 225 pg/mL (ref 180–914)

## 2020-07-08 LAB — CBC WITH DIFFERENTIAL/PLATELET
Abs Immature Granulocytes: 0.01 10*3/uL (ref 0.00–0.07)
Basophils Absolute: 0 10*3/uL (ref 0.0–0.1)
Basophils Relative: 0 %
Eosinophils Absolute: 0.1 10*3/uL (ref 0.0–0.5)
Eosinophils Relative: 3 %
HCT: 41.8 % (ref 39.0–52.0)
Hemoglobin: 13.7 g/dL (ref 13.0–17.0)
Immature Granulocytes: 0 %
Lymphocytes Relative: 48 %
Lymphs Abs: 2 10*3/uL (ref 0.7–4.0)
MCH: 32.4 pg (ref 26.0–34.0)
MCHC: 32.8 g/dL (ref 30.0–36.0)
MCV: 98.8 fL (ref 80.0–100.0)
Monocytes Absolute: 0.5 10*3/uL (ref 0.1–1.0)
Monocytes Relative: 13 %
Neutro Abs: 1.5 10*3/uL — ABNORMAL LOW (ref 1.7–7.7)
Neutrophils Relative %: 36 %
Platelets: 94 10*3/uL — ABNORMAL LOW (ref 150–400)
RBC: 4.23 MIL/uL (ref 4.22–5.81)
RDW: 13.7 % (ref 11.5–15.5)
WBC: 4.2 10*3/uL (ref 4.0–10.5)
nRBC: 0 % (ref 0.0–0.2)

## 2020-07-08 LAB — VITAMIN D 25 HYDROXY (VIT D DEFICIENCY, FRACTURES): Vit D, 25-Hydroxy: 39.29 ng/mL (ref 30–100)

## 2020-07-08 LAB — LACTATE DEHYDROGENASE: LDH: 129 U/L (ref 98–192)

## 2020-07-09 LAB — CHROMOGRANIN A: Chromogranin A (ng/mL): 180.2 ng/mL — ABNORMAL HIGH (ref 0.0–101.8)

## 2020-07-11 ENCOUNTER — Other Ambulatory Visit: Payer: Self-pay

## 2020-07-11 ENCOUNTER — Ambulatory Visit (HOSPITAL_COMMUNITY)
Admission: RE | Admit: 2020-07-11 | Discharge: 2020-07-11 | Disposition: A | Discharge status: Home / Self Care | Payer: Medicare Other | Source: Ambulatory Visit | Attending: Nurse Practitioner | Admitting: Nurse Practitioner

## 2020-07-11 DIAGNOSIS — R531 Weakness: Secondary | ICD-10-CM | POA: Diagnosis not present

## 2020-07-11 DIAGNOSIS — R911 Solitary pulmonary nodule: Secondary | ICD-10-CM | POA: Diagnosis not present

## 2020-07-11 DIAGNOSIS — R918 Other nonspecific abnormal finding of lung field: Secondary | ICD-10-CM

## 2020-07-11 DIAGNOSIS — R110 Nausea: Secondary | ICD-10-CM | POA: Diagnosis not present

## 2020-07-11 DIAGNOSIS — Z1152 Encounter for screening for COVID-19: Secondary | ICD-10-CM | POA: Diagnosis not present

## 2020-07-11 DIAGNOSIS — R059 Cough, unspecified: Secondary | ICD-10-CM | POA: Diagnosis not present

## 2020-07-17 ENCOUNTER — Telehealth: Payer: Self-pay | Admitting: Orthopaedic Surgery

## 2020-07-17 MED ORDER — HYDROCODONE-ACETAMINOPHEN 5-325 MG PO TABS
ORAL_TABLET | ORAL | 0 refills | Status: DC
Start: 2020-07-17 — End: 2020-08-14

## 2020-07-17 NOTE — Telephone Encounter (Signed)
Patient requests refill on Hydrocodone/Acetaminophen 5-325 Mgs. Qty56  Sig:One tablet by mouth every six hours as needed for pain. Must last 30 days.  Patient states he uses [facility]

## 2020-07-18 ENCOUNTER — Ambulatory Visit (HOSPITAL_COMMUNITY): Payer: Medicare Other | Admitting: Hematology

## 2020-07-24 ENCOUNTER — Emergency Department (HOSPITAL_COMMUNITY): Payer: Medicare Other

## 2020-07-24 ENCOUNTER — Emergency Department (HOSPITAL_COMMUNITY)
Admission: EM | Admit: 2020-07-24 | Discharge: 2020-07-25 | Disposition: A | Discharge status: Home / Self Care | Payer: Medicare Other | Attending: Emergency Medicine | Admitting: Emergency Medicine

## 2020-07-24 ENCOUNTER — Other Ambulatory Visit: Payer: Self-pay

## 2020-07-24 ENCOUNTER — Encounter (HOSPITAL_COMMUNITY): Payer: Self-pay | Admitting: Emergency Medicine

## 2020-07-24 DIAGNOSIS — Z743 Need for continuous supervision: Secondary | ICD-10-CM | POA: Diagnosis not present

## 2020-07-24 DIAGNOSIS — E039 Hypothyroidism, unspecified: Secondary | ICD-10-CM | POA: Diagnosis not present

## 2020-07-24 DIAGNOSIS — U071 COVID-19: Secondary | ICD-10-CM | POA: Diagnosis not present

## 2020-07-24 DIAGNOSIS — R4182 Altered mental status, unspecified: Secondary | ICD-10-CM | POA: Diagnosis not present

## 2020-07-24 DIAGNOSIS — Z79899 Other long term (current) drug therapy: Secondary | ICD-10-CM | POA: Insufficient documentation

## 2020-07-24 DIAGNOSIS — G9389 Other specified disorders of brain: Secondary | ICD-10-CM | POA: Diagnosis not present

## 2020-07-24 DIAGNOSIS — R531 Weakness: Secondary | ICD-10-CM | POA: Diagnosis not present

## 2020-07-24 DIAGNOSIS — R6883 Chills (without fever): Secondary | ICD-10-CM | POA: Diagnosis not present

## 2020-07-24 DIAGNOSIS — Z96653 Presence of artificial knee joint, bilateral: Secondary | ICD-10-CM | POA: Diagnosis not present

## 2020-07-24 DIAGNOSIS — G319 Degenerative disease of nervous system, unspecified: Secondary | ICD-10-CM | POA: Diagnosis not present

## 2020-07-24 DIAGNOSIS — I10 Essential (primary) hypertension: Secondary | ICD-10-CM | POA: Insufficient documentation

## 2020-07-24 DIAGNOSIS — F10920 Alcohol use, unspecified with intoxication, uncomplicated: Secondary | ICD-10-CM

## 2020-07-24 DIAGNOSIS — F10129 Alcohol abuse with intoxication, unspecified: Secondary | ICD-10-CM | POA: Insufficient documentation

## 2020-07-24 DIAGNOSIS — I6782 Cerebral ischemia: Secondary | ICD-10-CM | POA: Diagnosis not present

## 2020-07-24 DIAGNOSIS — R5381 Other malaise: Secondary | ICD-10-CM | POA: Diagnosis not present

## 2020-07-24 DIAGNOSIS — R6889 Other general symptoms and signs: Secondary | ICD-10-CM | POA: Diagnosis not present

## 2020-07-24 LAB — RESP PANEL BY RT-PCR (FLU A&B, COVID) ARPGX2
Influenza A by PCR: NEGATIVE
Influenza B by PCR: NEGATIVE
SARS Coronavirus 2 by RT PCR: POSITIVE — AB

## 2020-07-24 LAB — COMPREHENSIVE METABOLIC PANEL
ALT: 23 U/L (ref 0–44)
AST: 33 U/L (ref 15–41)
Albumin: 3.4 g/dL — ABNORMAL LOW (ref 3.5–5.0)
Alkaline Phosphatase: 48 U/L (ref 38–126)
Anion gap: 9 (ref 5–15)
BUN: 15 mg/dL (ref 8–23)
CO2: 27 mmol/L (ref 22–32)
Calcium: 8.7 mg/dL — ABNORMAL LOW (ref 8.9–10.3)
Chloride: 104 mmol/L (ref 98–111)
Creatinine, Ser: 1.19 mg/dL (ref 0.61–1.24)
GFR, Estimated: >60 mL/min (ref >60)
Glucose, Bld: 114 mg/dL — ABNORMAL HIGH (ref 70–99)
Potassium: 3.9 mmol/L (ref 3.5–5.1)
Sodium: 140 mmol/L (ref 135–145)
Total Bilirubin: 0.5 mg/dL (ref 0.3–1.2)
Total Protein: 7.2 g/dL (ref 6.5–8.1)

## 2020-07-24 LAB — CBC WITH DIFFERENTIAL/PLATELET
Abs Immature Granulocytes: 0.01 10*3/uL (ref 0.00–0.07)
Basophils Absolute: 0 10*3/uL (ref 0.0–0.1)
Basophils Relative: 0 %
Eosinophils Absolute: 0.2 10*3/uL (ref 0.0–0.5)
Eosinophils Relative: 4 %
HCT: 39.3 % (ref 39.0–52.0)
Hemoglobin: 12.7 g/dL — ABNORMAL LOW (ref 13.0–17.0)
Immature Granulocytes: 0 %
Lymphocytes Relative: 53 %
Lymphs Abs: 2.6 10*3/uL (ref 0.7–4.0)
MCH: 33 pg (ref 26.0–34.0)
MCHC: 32.3 g/dL (ref 30.0–36.0)
MCV: 102.1 fL — ABNORMAL HIGH (ref 80.0–100.0)
Monocytes Absolute: 0.6 10*3/uL (ref 0.1–1.0)
Monocytes Relative: 11 %
Neutro Abs: 1.6 10*3/uL — ABNORMAL LOW (ref 1.7–7.7)
Neutrophils Relative %: 32 %
Platelets: 153 10*3/uL (ref 150–400)
RBC: 3.85 MIL/uL — ABNORMAL LOW (ref 4.22–5.81)
RDW: 14.1 % (ref 11.5–15.5)
WBC: 5.1 10*3/uL (ref 4.0–10.5)
nRBC: 0 % (ref 0.0–0.2)

## 2020-07-24 LAB — ETHANOL: Alcohol, Ethyl (B): 297 mg/dL — ABNORMAL HIGH (ref <10)

## 2020-07-24 NOTE — ED Notes (Signed)
Patient on 12 lead EKG done at this time.

## 2020-07-24 NOTE — ED Notes (Signed)
Lab and Dr. Melina Copa at bedside at this time.

## 2020-07-24 NOTE — ED Provider Notes (Signed)
[facility] EMERGENCY DEPARTMENT Provider Note   CSN: [id number] Arrival date & time: 07/24/20  2110     History Chief Complaint  Patient presents with  . Alcohol Intoxication    [name] is a 69 y.o. male.  He is a poor historian.  He said he feels weak today, that he feels bad.  Had some chills.  Drank some alcohol but cannot tell me how much.  Denies headache blurry vision double vision chest pain shortness of breath abdominal pain nausea vomiting diarrhea or urinary symptoms.  No known fevers.  Michela Pitcher he has been eating and drinking okay.  Has a history of carcinoid tumor  The history is provided by the patient and the EMS personnel.  Weakness Severity:  Unable to specify Onset quality:  Gradual Duration:  1 day Timing:  Constant Progression:  Unchanged Chronicity:  New Context: alcohol use   Relieved by:  None tried Worsened by:  Nothing Ineffective treatments:  None tried Associated symptoms: near-syncope   Associated symptoms: no abdominal pain, no chest pain, no cough, no diarrhea, no dysuria, no falls, no fever, no headaches, no nausea, no shortness of breath, no vision change and no vomiting        Past Medical History:  Diagnosis Date  . Anemia    FeDA: GASTRIC POLYPS, B12; TCS 2008 EGD 2009, 2008-HB 11.1 MCV 83.6 CR 1.22, 2009 FERRITIN 102-22  . B12 deficiency   . Carcinoid tumor determined by biopsy of stomach 09/25/2014  . Chronic knee pain   . Diverticulosis of colon    Lower GI bleed 2008  . Essential hypertension   . GERD (gastroesophageal reflux disease)   . Gout   . Hepatomegaly    Hepatic steatosis  . History of alcohol abuse   . History of septic arthritis   . Hypothyroidism   . PUD   . Substance abuse [facility])     Patient Active Problem List   Diagnosis Date Noted  . Colon polyps 12/28/2018  . UTI (urinary tract infection) 09/22/2018  . AKI (acute kidney injury) ([place]) 09/22/2018  . Left inguinal hernia 09/20/2018  . S/P total knee  replacement, left 06/29/06 01/27/2018  . Hyperlipidemia 10/23/2016  . Mild intellectual disabilities (CODE) 09/29/2016  . Personal history of gout 09/22/2016  . Literacy level of illiterate 09/22/2016  . S/P TKR (total knee replacement), right 09/18/08 09/22/2016  . Carcinoid tumor determined by biopsy of stomach 04/10/2016  . Tubulovillous adenoma of colon 09/12/2015  . Neuroendocrine tumor 09/25/2014  . Elevated LFTs 07/17/2014  . Iron deficiency anemia 07/29/2010  . Fatty liver 07/29/2010  . Alcohol abuse 11/30/2008  . Osteoarthrosis involving lower leg 08/20/2008  . Essential hypertension 10/20/2006  . B12 deficiency 08/15/2006  . GERD 08/15/2006  . IBS 08/15/2006  . BPH without obstruction/lower urinary tract symptoms 08/15/2006  . OSTEOARTHRITIS 08/15/2006  . DEGENERATION, DISC NOS 08/15/2006    Past Surgical History:  Procedure Laterality Date  . BIOPSY  08/14/2014   Procedure: BIOPSY;  Surgeon: Danie Binder, MD;  Location: [facility] ORS;  Service: Endoscopy;;  . BIOPSY  03/12/2015   Procedure: BIOPSY;  Surgeon: Danie Binder, MD;  Location: [facility] OR[facility];  Service: Endoscopy;;  . BIOPSY  04/21/2016   Procedure: BIOPSY;  Surgeon: Danie Binder, MD;  Location: [facility] ENDO SUITE;  Service: Endoscopy;;  bx's of antrum, body of stomach, fundus, and cardia  . CHOLECYSTECTOMY    . COLONOSCOPY  2008 [facility] DJ   LGIB 2o to  TICS, prep good  . COLONOSCOPY WITH PROPOFOL N/A 08/14/2014   SLF: 1. Four large colon polyps removed. 2. The left colon is redundant 3. Moderate diverticulosis throughout teh entire examined colon  . COLONOSCOPY WITH PROPOFOL N/A 11/06/2014   SLF: 8 small polyps removed. One large pedunculated polyp removed from the ascending colon, tubulovillous and tubular adenomas. Next colonoscopy March 2019  . COLONOSCOPY WITH PROPOFOL N/A 01/04/2018   Procedure: COLONOSCOPY WITH PROPOFOL;  Surgeon: Danie Binder, MD;  Location: [facility] END[facility] SU[facility];  Service: Endoscopy;  Laterality: N/A;  10:00am   . ESOPHAGOGASTRODUODENOSCOPY  12/08/2007   CBS:WHQP gastric polyps seen in the cardia and body of the stomach/Normal esophagus without evidence of Barrett's, mass, erosion/Normal duodenal bulb and second portion of the duodenum. Benign bx.  . ESOPHAGOGASTRODUODENOSCOPY (EGD) WITH PROPOFOL N/A 08/14/2014   SLF: 1. Heme postive stools due to gastric and colon polyps 2. Multiple gastric   . ESOPHAGOGASTRODUODENOSCOPY (EGD) WITH PROPOFOL N/A 03/12/2015   SLF: Multiple gastric nodules seen in gasric body/antrum. 2. Non-erosive gastritis ( inflammation) was found in the gastric antrum.   . ESOPHAGOGASTRODUODENOSCOPY (EGD) WITH PROPOFOL N/A 10/08/2015   Procedure: ESOPHAGOGASTRODUODENOSCOPY (EGD) WITH PROPOFOL;  Surgeon: Danie Binder, MD;  Location: [facility];  Service: Endoscopy;  Laterality: N/A;  0945  . ESOPHAGOGASTRODUODENOSCOPY (EGD) WITH PROPOFOL N/A 04/21/2016   Procedure: ESOPHAGOGASTRODUODENOSCOPY (EGD) WITH PROPOFOL;  Surgeon: Danie Binder, MD;  Location: [facility];  Service: Endoscopy;  Laterality: N/A;  730   . HAND SURGERY Right    fracture repair with plates  . INGUINAL HERNIA REPAIR Left 11/09/2018   Procedure: HERNIA REPAIR INGUINAL ADULT WITH MESH;  Surgeon: Virl Cagey, MD;  Location: [facility] ORS;  Service: General;  Laterality: Left;  . JOINT REPLACEMENT Bilateral    knees  . POLYPECTOMY  08/14/2014   Procedure: POLYPECTOMY;  Surgeon: Danie Binder, MD;  Location: [facility] OR[facility];  Service: Endoscopy;;  . POLYPECTOMY N/A 11/06/2014   Procedure: POLYPECTOMY;  Surgeon: Danie Binder, MD;  Location: [facility];  Service: Endoscopy;  Laterality: N/A;  Transverse Colon x3, Ascending Colon x2, Descending Colon x3  . REPLACEMENT TOTAL KNEE BILATERAL    . UPPER GASTROINTESTINAL ENDOSCOPY  APR 2009   INFLAMED HYPERPLASTIC POLYPS, CHRONIC GASTRITIS       Family History  Problem Relation Age of Onset  . Diabetes Mother   . Renal Disease Mother        failure  . Diabetes Father         'old age'  . Hypertension Sister   . Hypertension Brother   . Diabetes Brother   . Diabetes Sister   . Diabetes Sister   . Diabetes Brother   . Hypertension Brother   . Diabetes Brother        right BKA  . Kidney failure Brother        on dialysis  . Kidney failure Brother        on dialysis  . Diabetes Brother        bilateral BKA  . Colon polyps Neg Hx   . Colon cancer Neg Hx   . Pancreatic disease Neg Hx     Social History   Tobacco Use  . Smoking status: Never Smoker  . Smokeless tobacco: Never Used  . Tobacco comment: Quit x 10 years/ never smoked on regular basis  Vaping Use  . Vaping Use: Never used  Substance Use Topics  . Alcohol use: Yes  Alcohol/week: 2.0 standard drinks    Types: 2 Shots of liquor per week    Comment: drinks on weekends, gin/vodka 1/5th.  . Drug use: No    Home Medications Prior to Admission medications   Medication Sig Start Date End Date Taking? Authorizing Provider  amLODipine (NORVASC) 5 MG tablet TAKE 1 TABLET BY MOUTH ONCE DAILY. 03/09/19   [name], MD  atorvastatin (LIPITOR) 20 MG tablet Take 1 tablet (20 mg total) by mouth daily. 10/23/16   Raylene Everts, MD  cephALEXin (KEFLEX) 500 MG capsule Take 1 capsule (500 mg total) by mouth 4 (four) times daily. 05/26/20   Milton Ferguson, MD  clindamycin (CLINDAGEL) 1 % gel Apply 1 application topically daily. Applies to back of neck    [provider]  colchicine 0.6 MG tablet TAKE 1 TABLET BY MOUTH 3 TIMES DAILY X5 DAYS FOR GOUT PAIN. 03/26/20   Sanjuana Kava, MD  HYDROcodone-acetaminophen (NORCO/VICODIN) 5-325 MG tablet One tablet by mouth every six hours as needed for pain.  Must last 30 days. 07/17/20   Sanjuana Kava, MD  iron polysaccharides (NIFEREX) 150 MG capsule Take 1 capsule (150 mg total) by mouth daily. 05/20/20   Derek Jack, MD  levothyroxine (SYNTHROID, LEVOTHROID) 200 MCG tablet Take 1 tablet (200 mcg total) by mouth daily before breakfast.  09/24/18   Roxan Hockey, MD  metoprolol succinate (TOPROL-XL) 25 MG 24 hr tablet Take 1.5 tablets (37.5 mg total) by mouth daily. 02/19/20   [name], MD  omeprazole (PRILOSEC) 20 MG capsule TAKE 1 CAPSULE BY MOUTH ONCE DAILY. Patient taking differently: Take 20 mg by mouth daily.  11/19/17   Carlis Stable, NP  sildenafil (VIAGRA) 100 MG tablet Take 50 mg by mouth daily as needed. 09/28/19   [provider]  tamsulosin (FLOMAX) 0.4 MG CAPS capsule Take 1 capsule (0.4 mg total) by mouth daily after supper. Patient taking differently: Take 0.4 mg by mouth daily.  09/24/18   Emokpae, Courage, MD  ULORIC 40 MG tablet TAKE 1 TABLET BY MOUTH ONCE A DAY. Patient taking differently: Take 40 mg by mouth daily.  12/21/17   Raylene Everts, MD    Allergies    Aspirin  Review of Systems   Review of Systems  Constitutional: Negative for fever.  HENT: Negative for sore throat.   Eyes: Negative for visual disturbance.  Respiratory: Negative for cough and shortness of breath.   Cardiovascular: Positive for near-syncope. Negative for chest pain.  Gastrointestinal: Negative for abdominal pain, diarrhea, nausea and vomiting.  Genitourinary: Negative for dysuria.  Musculoskeletal: Negative for falls and neck pain.  Skin: Negative for rash.  Neurological: Positive for weakness. Negative for headaches.    Physical Exam Updated Vital Signs BP 120/79 (BP Location: Left Arm)   Pulse 86   Temp (!) 97.5 F (36.4 C) (Oral)   Resp 16   Ht 6\' 3"  (1.905 m)   Wt 105.7 kg   SpO2 94%   BMI 29.13 kg/m   Physical Exam Vitals and nursing note reviewed.  Constitutional:      Appearance: Normal appearance. He is well-developed.  HENT:     Head: Normocephalic and atraumatic.  Eyes:     Conjunctiva/sclera: Conjunctivae normal.  Cardiovascular:     Rate and Rhythm: Normal rate and regular rhythm.     Heart sounds: No murmur heard.   Pulmonary:     Effort: Pulmonary effort is normal. No  respiratory distress.  Breath sounds: Normal breath sounds.  Abdominal:     Palpations: Abdomen is soft.     Tenderness: There is no abdominal tenderness.  Musculoskeletal:        General: No deformity or signs of injury.     Cervical back: Neck supple.  Skin:    General: Skin is warm and dry.     Capillary Refill: Capillary refill takes less than 2 seconds.  Neurological:     General: No focal deficit present.     Mental Status: He is alert.     Cranial Nerves: No cranial nerve deficit.     Sensory: No sensory deficit.     Motor: No weakness.     ED Results / Procedures / Treatments   Labs (all labs ordered are listed, but only abnormal results are displayed) Labs Reviewed  RESP PANEL BY RT-PCR (FLU A&B, COVID) ARPGX2 - Abnormal; Notable for the following components:      Result Value   SARS Coronavirus 2 by RT PCR POSITIVE (*)    All other components within normal limits  COMPREHENSIVE METABOLIC PANEL - Abnormal; Notable for the following components:   Glucose, Bld 114 (*)    Calcium 8.7 (*)    Albumin 3.4 (*)    All other components within normal limits  CBC WITH DIFFERENTIAL/PLATELET - Abnormal; Notable for the following components:   RBC 3.85 (*)    Hemoglobin 12.7 (*)    MCV 102.1 (*)    Neutro Abs 1.6 (*)    All other components within normal limits  ETHANOL - Abnormal; Notable for the following components:   Alcohol, Ethyl (B) 297 (*)    All other components within normal limits  URINALYSIS, ROUTINE W REFLEX MICROSCOPIC - Abnormal; Notable for the following components:   Leukocytes,Ua TRACE (*)    All other components within normal limits    EKG EKG Interpretation  Date/Time:  Wednesday July 24 2020 21:22:18 EST Ventricular Rate:  91 PR Interval:    QRS Duration: 90 QT Interval:  365 QTC Calculation: 450 R Axis:   -27 Text Interpretation: Sinus rhythm Prolonged PR interval Abnormal R-wave progression, early transition Left ventricular  hypertrophy No significant change since prior 9/21 Confirmed by Aletta Edouard [id number]) on 07/24/2020 10:07:10 PM   Radiology CT Head Wo Contrast  Result Date: 07/25/2020 CLINICAL DATA:  Mental status changes. Unknown cause. Alcohol use. COVID positive 2 weeks ago. EXAM: CT HEAD WITHOUT CONTRAST TECHNIQUE: Contiguous axial images were obtained from the base of the skull through the vertex without intravenous contrast. COMPARISON:  08/25/2018 FINDINGS: Brain: Diffuse cerebral atrophy. Cerebellar atrophy. Low-attenuation changes in the deep white matter consistent small vessel ischemia. No mass-effect or midline shift. No abnormal extra-axial fluid collections. Gray-white matter junctions are distinct. Basal cisterns are not effaced. No acute intracranial hemorrhage. Vascular: No hyperdense vessel or unexpected calcification. Skull: Calvarium appears intact. Sinuses/Orbits: Paranasal sinuses and mastoid air cells are clear. Other: None. IMPRESSION: No acute intracranial abnormalities. Chronic atrophy and small vessel ischemia. Electronically Signed   By: Lucienne Capers M.D.   On: 07/25/2020 00:57   DG Chest Port 1 View  Result Date: 07/24/2020 CLINICAL DATA:  Chills starting today. COVID-19 positive 2 weeks ago. EXAM: PORTABLE CHEST 1 VIEW COMPARISON:  05/26/2020 FINDINGS: Heart size and pulmonary vascularity are normal. Lungs are clear. No consolidation or airspace disease. No pleural effusions. No pneumothorax. Mediastinal contours appear intact. Tortuous aorta. No change since prior study. IMPRESSION: No active disease. Electronically Signed  By: Lucienne Capers M.D.   On: 07/24/2020 23:25    Procedures Procedures (including critical care time)  Medications Ordered in ED Medications - No data to display  ED Course  I have reviewed the triage vital signs and the nursing notes.  Pertinent labs & imaging results that were available during my care of the patient were reviewed by me and  considered in my medical decision making (see chart for details).  Clinical Course as of Jul 26 923  Wed Jul 24, 2020  2302 Patient's Covid test does come back positive.  Satting 94% on room air.   [MB]  2310 Chest x-ray interpreted by me as no acute infiltrates.  Awaiting radiology reading.   [MB]  2319 Now patient tells me he tested positive for Covid a few weeks ago.   [MB]    Clinical Course User Index [MB] Hayden Rasmussen, MD   MDM Rules/Calculators/A&P                         This patient complains of generalized weakness; this involves an extensive number of treatment Options and is a complaint that carries with it a high risk of complications and Morbidity. The differential includes Covid, pneumonia, metabolic derangement, hypoxia, intoxication, ACS, stroke  I ordered, reviewed and interpreted labs, which included CBC with normal white count, stable hemoglobin, chemistries with a mildly elevated glucose and low calcium, urinalysis without signs of infection, alcohol level markedly elevated at 297, Covid testing positive I ordered imaging studies which included chest x-ray and I independently    visualized and interpreted imaging which showed no acute pulmonary disease Previous records obtained and reviewed in epic, no recent admissions  After the interventions stated above, I reevaluated the patient and found patient to be fairly asymptomatic with stable vitals.  He is signed out to oncoming provider Dr. Tomi Bamberger to follow-up on head CT and reassess for stability ambulating for discharge.   Final Clinical Impression(s) / ED Diagnoses Final diagnoses:  Alcoholic intoxication without complication ([place])  [id number] virus infection  Generalized weakness    Rx / DC Orders ED Discharge Orders    None       Hayden Rasmussen, MD 07/25/20 [phone]

## 2020-07-24 NOTE — ED Triage Notes (Signed)
[facility] - pt states he started having the shakes today, states that he drank alcohol to help. states he just doesn't feel right. States he was "covid + maybe two weeks ago, doesn't know for sure."

## 2020-07-24 NOTE — Discharge Instructions (Signed)
You were seen in the emergency department for evaluation of feeling weak.  You tested positive for Covid.  Your alcohol level was also significantly elevated.  Your oxygenation level was good and your chest x-ray did not show any pneumonia.  It will be important for you to follow-up with your primary care doctor.  Alcohol in moderation.  Drink plenty of fluids.  Return to the emergency department for any worsening or concerning symptoms.

## 2020-07-25 ENCOUNTER — Telehealth: Payer: Self-pay | Admitting: Nurse Practitioner

## 2020-07-25 DIAGNOSIS — U071 COVID-19: Secondary | ICD-10-CM

## 2020-07-25 LAB — URINALYSIS, ROUTINE W REFLEX MICROSCOPIC
Bacteria, UA: NONE SEEN
Bilirubin Urine: NEGATIVE
Glucose, UA: NEGATIVE mg/dL
Hgb urine dipstick: NEGATIVE
Ketones, ur: NEGATIVE mg/dL
Nitrite: NEGATIVE
Protein, ur: NEGATIVE mg/dL
Specific Gravity, Urine: 1.009 (ref 1.005–1.030)
pH: 5 (ref 5.0–8.0)

## 2020-07-25 NOTE — Telephone Encounter (Signed)
Called to discuss with [name] about Covid symptoms and the use of a monoclonal antibody infusion for those with mild to moderate Covid symptoms and at a high risk of hospitalization.    Pt does not qualify for infusion therapy given asymptomatic infection. Isolation precautions discussed. Advised to contact back for consideration should symptoms develop. Patient verbalized understanding.     Patient Active Problem List   Diagnosis Date Noted  . Colon polyps 12/28/2018  . UTI (urinary tract infection) 09/22/2018  . AKI (acute kidney injury) ([place]) 09/22/2018  . Left inguinal hernia 09/20/2018  . S/P total knee replacement, left 06/29/06 01/27/2018  . Hyperlipidemia 10/23/2016  . Mild intellectual disabilities (CODE) 09/29/2016  . Personal history of gout 09/22/2016  . Literacy level of illiterate 09/22/2016  . S/P TKR (total knee replacement), right 09/18/08 09/22/2016  . Carcinoid tumor determined by biopsy of stomach 04/10/2016  . Tubulovillous adenoma of colon 09/12/2015  . Neuroendocrine tumor 09/25/2014  . Elevated LFTs 07/17/2014  . Iron deficiency anemia 07/29/2010  . Fatty liver 07/29/2010  . Alcohol abuse 11/30/2008  . Osteoarthrosis involving lower leg 08/20/2008  . Essential hypertension 10/20/2006  . B12 deficiency 08/15/2006  . GERD 08/15/2006  . IBS 08/15/2006  . BPH without obstruction/lower urinary tract symptoms 08/15/2006  . OSTEOARTHRITIS 08/15/2006  . DEGENERATION, DISC NOS 08/15/2006     Beckey Rutter, NP [phone] [email or web address]

## 2020-07-25 NOTE — ED Provider Notes (Signed)
Patient was left at change of shift to be discharged once he has sobered up enough to be safe.  Patient is awake and alert, he is able to walk without assistance.  Somebody is coming to pick him up.  He was discharged home.   Rolland Porter, MD 07/25/20 [phone]

## 2020-07-30 ENCOUNTER — Other Ambulatory Visit: Payer: Self-pay

## 2020-07-30 ENCOUNTER — Inpatient Hospital Stay (HOSPITAL_BASED_OUTPATIENT_CLINIC_OR_DEPARTMENT_OTHER): Payer: Medicare Other | Admitting: Oncology

## 2020-07-30 VITALS — BP 153/92 | HR 91 | Temp 97.3°F | Resp 17 | Wt 230.3 lb

## 2020-07-30 DIAGNOSIS — Z8719 Personal history of other diseases of the digestive system: Secondary | ICD-10-CM | POA: Diagnosis not present

## 2020-07-30 DIAGNOSIS — I10 Essential (primary) hypertension: Secondary | ICD-10-CM | POA: Diagnosis not present

## 2020-07-30 DIAGNOSIS — L249 Irritant contact dermatitis, unspecified cause: Secondary | ICD-10-CM

## 2020-07-30 DIAGNOSIS — D3A8 Other benign neuroendocrine tumors: Secondary | ICD-10-CM

## 2020-07-30 DIAGNOSIS — L259 Unspecified contact dermatitis, unspecified cause: Secondary | ICD-10-CM | POA: Diagnosis not present

## 2020-07-30 DIAGNOSIS — Z79899 Other long term (current) drug therapy: Secondary | ICD-10-CM | POA: Diagnosis not present

## 2020-07-30 DIAGNOSIS — R7989 Other specified abnormal findings of blood chemistry: Secondary | ICD-10-CM | POA: Diagnosis not present

## 2020-07-30 DIAGNOSIS — E039 Hypothyroidism, unspecified: Secondary | ICD-10-CM | POA: Diagnosis not present

## 2020-07-30 DIAGNOSIS — R911 Solitary pulmonary nodule: Secondary | ICD-10-CM | POA: Diagnosis not present

## 2020-07-30 DIAGNOSIS — C7A00 Malignant carcinoid tumor of unspecified site: Secondary | ICD-10-CM | POA: Diagnosis not present

## 2020-07-30 MED ORDER — TRIAMCINOLONE ACETONIDE 0.5 % EX OINT: 1.0000 "application " | TOPICAL_OINTMENT | Freq: Two times a day (BID) | CUTANEOUS | 0 refills | Status: DC

## 2020-07-30 NOTE — Progress Notes (Signed)
[name], [place] [address]   CLINIC:  Medical Oncology/Hematology  PCP:  Eloise Harman, DO [place] [address] [phone]   REASON FOR VISIT:  Follow-up for Neuroendocrine tumor   CURRENT THERAPY:  Clinical Surveillance   INTERVAL HISTORY:  [name] 69 y.o. male presents today for follow up.  Patient was evaluated in the emergency room on 07/24/2020 for intoxication and found to be COVID-19 positive.  He was asymptomatic.  He reports today that he continues to not have any symptoms of COVID-19.  Admits to an itchy rash on bilateral anterior hands extending to his wrists.  Denies any new lotions, laundry detergents or soaps.  Denies any abdominal concerns.  Appetite has been good.  Has normal bowel movements.  Denies any weight loss.  Denies night sweats.  REVIEW OF SYSTEMS:  Review of Systems  Constitutional: Negative.   HENT:  Negative.   Eyes: Negative.   Respiratory: Negative.   Cardiovascular: Negative.   Endocrine: Negative.   Genitourinary: Negative.    Musculoskeletal: Positive for arthralgias.  Skin: Positive for itching and rash.  Neurological: Negative.   Hematological: Negative.   Psychiatric/Behavioral: Negative.      PAST MEDICAL/SURGICAL HISTORY:  Past Medical History:  Diagnosis Date  . Anemia    FeDA: GASTRIC POLYPS, B12; TCS 2008 EGD 2009, 2008-HB 11.1 MCV 83.6 CR 1.22, 2009 FERRITIN 102-22  . B12 deficiency   . Carcinoid tumor determined by biopsy of stomach 09/25/2014  . Chronic knee pain   . Diverticulosis of colon    Lower GI bleed 2008  . Essential hypertension   . GERD (gastroesophageal reflux disease)   . Gout   . Hepatomegaly    Hepatic steatosis  . History of alcohol abuse   . History of septic arthritis   . Hypothyroidism   . PUD   . Substance abuse [facility])    Past Surgical History:  Procedure Laterality Date  . BIOPSY  08/14/2014   Procedure: BIOPSY;  Surgeon: Danie Binder, MD;  Location: [facility];  Service: Endoscopy;;  . BIOPSY  03/12/2015   Procedure: BIOPSY;  Surgeon: Danie Binder, MD;  Location: [facility];  Service: Endoscopy;;  . BIOPSY  04/21/2016   Procedure: BIOPSY;  Surgeon: Danie Binder, MD;  [facility];  Service: Endoscopy;;  bx's of antrum, body of stomach, fundus, and cardia  . CHOLECYSTECTOMY    . COLONOSCOPY  2008 [facility] DJ   LGIB 2o to TICS, prep good  . COLONOSCOPY WITH PROPOFOL N/A 08/14/2014   SLF: 1. Four large colon polyps removed. 2. The left colon is redundant 3. Moderate diverticulosis throughout teh entire examined colon  . COLONOSCOPY WITH PROPOFOL N/A 11/06/2014   SLF: 8 small polyps removed. One large pedunculated polyp removed from the ascending colon, tubulovillous and tubular adenomas. Next colonoscopy March 2019  . COLONOSCOPY WITH PROPOFOL N/A 01/04/2018   Procedure: COLONOSCOPY WITH PROPOFOL;  Surgeon: Danie Binder, MD;  [facility];  Service: Endoscopy;  Laterality: N/A;  10:00am  . ESOPHAGOGASTRODUODENOSCOPY  12/08/2007   JOA:CZYS gastric polyps seen in the cardia and body of the stomach/Normal esophagus without evidence of Barrett's, mass, erosion/Normal duodenal bulb and second portion of the duodenum. Benign bx.  . ESOPHAGOGASTRODUODENOSCOPY (EGD) WITH PROPOFOL N/A 08/14/2014   SLF: 1. Heme postive stools due to gastric and colon polyps 2. Multiple gastric   . ESOPHAGOGASTRODUODENOSCOPY (EGD) WITH PROPOFOL N/A 03/12/2015  SLF: Multiple gastric nodules seen in gasric body/antrum. 2. Non-erosive gastritis ( inflammation) was found in the gastric antrum.   . ESOPHAGOGASTRODUODENOSCOPY (EGD) WITH PROPOFOL N/A 10/08/2015   Procedure: ESOPHAGOGASTRODUODENOSCOPY (EGD) WITH PROPOFOL;  Surgeon: Danie Binder, MD;  [facility];  Service: Endoscopy;  Laterality: N/A;  0945  . ESOPHAGOGASTRODUODENOSCOPY (EGD) WITH PROPOFOL N/A 04/21/2016   Procedure: ESOPHAGOGASTRODUODENOSCOPY (EGD) WITH PROPOFOL;   Surgeon: Danie Binder, MD;  [facility];  Service: Endoscopy;  Laterality: N/A;  730   . HAND SURGERY Right    fracture repair with plates  . INGUINAL HERNIA REPAIR Left 11/09/2018   Procedure: HERNIA REPAIR INGUINAL ADULT WITH MESH;  Surgeon: Virl Cagey, MD;  Location: [facility];  Service: General;  Laterality: Left;  . JOINT REPLACEMENT Bilateral    knees  . POLYPECTOMY  08/14/2014   Procedure: POLYPECTOMY;  Surgeon: Danie Binder, MD;  Location: [facility];  Service: Endoscopy;;  . POLYPECTOMY N/A 11/06/2014   Procedure: POLYPECTOMY;  Surgeon: Danie Binder, MD;  Location: [facility];  Service: Endoscopy;  Laterality: N/A;  Transverse Colon x3, Ascending Colon x2, Descending Colon x3  . REPLACEMENT TOTAL KNEE BILATERAL    . UPPER GASTROINTESTINAL ENDOSCOPY  APR 2009   INFLAMED HYPERPLASTIC POLYPS, CHRONIC GASTRITIS     SOCIAL HISTORY:  Social History   Socioeconomic History  . Marital status: Single    Spouse name: Not on file  . Number of children: 0  . Years of education: 3  . Highest education level: Not on file  Occupational History  . Occupation: retired    Comment: farming  Tobacco Use  . Smoking status: Never Smoker  . Smokeless tobacco: Never Used  . Tobacco comment: Quit x 10 years/ never smoked on regular basis  Vaping Use  . Vaping Use: Never used  Substance and Sexual Activity  . Alcohol use: Yes    Alcohol/week: 2.0 standard drinks    Types: 2 Shots of liquor per week    Comment: drinks on weekends, gin/vodka 1/5th.  . Drug use: No  . Sexual activity: Never  Other Topics Concern  . Not on file  Social History Narrative   HE DOES NOT HAVE ANY CHILDREN.   Lives alone   Does not drive   CAN NOT READ   Social Determinants of Health   Financial Resource Strain: Low Risk   . Difficulty of Paying Living Expenses: Not hard at all  Food Insecurity: No Food Insecurity  . Worried About Charity fundraiser in the Last Year: Never true  . Ran Out  of Food in the Last Year: Never true  Transportation Needs: No Transportation Needs  . Lack of Transportation (Medical): No  . Lack of Transportation (Non-Medical): No  Physical Activity: Insufficiently Active  . Days of Exercise per Week: 7 days  . Minutes of Exercise per Session: 20 min  Stress: No Stress Concern Present  . Feeling of Stress : Not at all  Social Connections: Socially Isolated  . Frequency of Communication with Friends and Family: Never  . Frequency of Social Gatherings with Friends and Family: Once a week  . Attends Religious Services: Never  . Active Member of Clubs or Organizations: No  . Attends Archivist Meetings: Never  . Marital Status: Never married  Intimate Partner Violence: Not At Risk  . Fear of Current or Ex-Partner: No  . Emotionally Abused: No  . Physically Abused: No  . Sexually  Abused: No    FAMILY HISTORY:  Family History  Problem Relation Age of Onset  . Diabetes Mother   . Renal Disease Mother        failure  . Diabetes Father        'old age'  . Hypertension Sister   . Hypertension Brother   . Diabetes Brother   . Diabetes Sister   . Diabetes Sister   . Diabetes Brother   . Hypertension Brother   . Diabetes Brother        right BKA  . Kidney failure Brother        on dialysis  . Kidney failure Brother        on dialysis  . Diabetes Brother        bilateral BKA  . Colon polyps Neg Hx   . Colon cancer Neg Hx   . Pancreatic disease Neg Hx     CURRENT MEDICATIONS:  Outpatient Encounter Medications as of 07/30/2020  Medication Sig  . amLODipine (NORVASC) 5 MG tablet TAKE 1 TABLET BY MOUTH ONCE DAILY.  Marland Kitchen atorvastatin (LIPITOR) 20 MG tablet Take 1 tablet (20 mg total) by mouth daily.  . cephALEXin (KEFLEX) 500 MG capsule Take 1 capsule (500 mg total) by mouth 4 (four) times daily.  . clindamycin (CLINDAGEL) 1 % gel Apply 1 application topically daily. Applies to back of neck  . colchicine 0.6 MG tablet TAKE 1 TABLET  BY MOUTH 3 TIMES DAILY X5 DAYS FOR GOUT PAIN.  . HYDROcodone-acetaminophen (NORCO/VICODIN) 5-325 MG tablet One tablet by mouth every six hours as needed for pain.  Must last 30 days.  . iron polysaccharides (NIFEREX) 150 MG capsule Take 1 capsule (150 mg total) by mouth daily.  Marland Kitchen levothyroxine (SYNTHROID, LEVOTHROID) 200 MCG tablet Take 1 tablet (200 mcg total) by mouth daily before breakfast.  . metoprolol succinate (TOPROL-XL) 25 MG 24 hr tablet Take 1.5 tablets (37.5 mg total) by mouth daily.  Marland Kitchen omeprazole (PRILOSEC) 20 MG capsule TAKE 1 CAPSULE BY MOUTH ONCE DAILY. (Patient taking differently: Take 20 mg by mouth daily. )  . sildenafil (VIAGRA) 100 MG tablet Take 50 mg by mouth daily as needed.  . tamsulosin (FLOMAX) 0.4 MG CAPS capsule Take 1 capsule (0.4 mg total) by mouth daily after supper. (Patient taking differently: Take 0.4 mg by mouth daily. )  . ULORIC 40 MG tablet TAKE 1 TABLET BY MOUTH ONCE A DAY. (Patient taking differently: Take 40 mg by mouth daily. )  . ondansetron (ZOFRAN) 4 MG tablet Take 4 mg by mouth 3 (three) times daily as needed. (Patient not taking: Reported on 07/30/2020)  . triamcinolone ointment (KENALOG) 0.5 % Apply 1 application topically 2 (two) times daily.   No facility-administered encounter medications on file as of 07/30/2020.    ALLERGIES:  Allergies  Allergen Reactions  . Aspirin Other (See Comments)    REACTION: Diverticular Bleed     PHYSICAL EXAM:  ECOG Performance status: 1  Vitals:   07/30/20 0950  BP: (!) 153/92  Pulse: 91  Resp: 17  Temp: (!) 97.3 F (36.3 C)  SpO2: 99%   Filed Weights   07/30/20 0950  Weight: 230 lb 4.8 oz (104.5 kg)    Physical Exam Constitutional:      Appearance: He is obese.  HENT:     Head: Normocephalic.     Right Ear: External ear normal.     Left Ear: External ear normal.     Nose: Nose normal.  Mouth/Throat:     Mouth: Mucous membranes are moist.  Eyes:     Conjunctiva/sclera: Conjunctivae  normal.  Cardiovascular:     Rate and Rhythm: Normal rate and regular rhythm.     Pulses: Normal pulses.     Heart sounds: Normal heart sounds.  Pulmonary:     Effort: Pulmonary effort is normal.     Breath sounds: Normal breath sounds.  Abdominal:     General: Bowel sounds are normal.  Musculoskeletal:        General: Normal range of motion.     Cervical back: Normal range of motion.  Skin:    General: Skin is warm and dry.     Comments: Atopic dermatitis on bilateral anterior surface of hands  Neurological:     General: No focal deficit present.     Mental Status: He is alert.  Psychiatric:        Mood and Affect: Mood normal.        Behavior: Behavior normal.        Thought Content: Thought content normal.        Judgment: Judgment normal.      LABORATORY DATA:  I have reviewed the labs as listed.  CBC    Component Value Date/Time   WBC 5.1 07/24/2020 2215   RBC 3.85 (L) 07/24/2020 2215   HGB 12.7 (L) 07/24/2020 2215   HCT 39.3 07/24/2020 2215   HCT 44 01/05/2013 0000   PLT 153 07/24/2020 2215   PLT 148 01/05/2013 0000   MCV 102.1 (H) 07/24/2020 2215   MCV 90.4 01/05/2013 0000   MCH 33.0 07/24/2020 2215   MCHC 32.3 07/24/2020 2215   RDW 14.1 07/24/2020 2215   LYMPHSABS 2.6 07/24/2020 2215   MONOABS 0.6 07/24/2020 2215   EOSABS 0.2 07/24/2020 2215   BASOSABS 0.0 07/24/2020 2215   CMP Latest Ref Rng & Units 07/24/2020 07/08/2020 05/26/2020  Glucose 70 - 99 mg/dL 114(H) 108(H) 131(H)  BUN 8 - 23 mg/dL 15 14 15   Creatinine 0.61 - 1.24 mg/dL 1.19 1.16 1.16  Sodium 135 - 145 mmol/L 140 133(L) 134(L)  Potassium 3.5 - 5.1 mmol/L 3.9 3.8 4.0  Chloride 98 - 111 mmol/L 104 95(L) 100  CO2 22 - 32 mmol/L 27 28 21(L)  Calcium 8.9 - 10.3 mg/dL 8.7(L) 9.0 8.4(L)  Total Protein 6.5 - 8.1 g/dL 7.2 7.7 -  Total Bilirubin 0.3 - 1.2 mg/dL 0.5 0.9 -  Alkaline Phos 38 - 126 U/L 48 49 -  AST 15 - 41 U/L 33 45(H) -  ALT 0 - 44 U/L 23 29 -       ASSESSMENT & PLAN:  1.  Well- differentiated neuroendocrine carcinoid tumor: -Polypectomy by Dr. Oneida Alar on 08/14/2014 and 10/08/2015 with negative octreotide scan on 09/19/2014. -His case was presented at GI tumor board in Feb/March 2016 with recommendation for anatomic biopsies to guide role of surgical resection.  Anatomic biopsies in 03/2016 were negative. -His last 2 chromogranin A levels have been elevated January 2020 it was 186.0.  August 2020 it was 180.4.  Dr. Oneida Alar is aware of elevated levels and has seen the patient in consult and advised EGD.  Patient has refused at this time. -CT abdomen pelvis was negative for disease recurrence. -Patient has no signs and symptoms of carcinoid syndrome. -I have advised patient to follow-up with Dr. Oneida Alar. -His last DOT scan was done in February 2020 was negative for any disease. -Chromogranin levels have fluctuated over the past  2 years ranging from 149.5-186.  -Labs done on 07/08/2020 show a chromogranin A level of 180.2. -He will follow-up in 6 months with repeat labs.  2.  Pulmonary nodule: -6 mm LUL pulmonary nodule on neck PET scan done on 10/18/2018. -CT chest from September 2020 revealed stable left lung nodule. -Repeat CT chest from 07/11/2020 showed interval development of multiple patchy areas of groundglass attenuation scattered throughout both lungs.  Other nonspecific these are favored to represent sequelae of inflammation or infection.  Atypical viral infection not excluded.  This is likely related to recent COVID-19 diagnosis.  The 7 mm pulmonary nodule remains stable.  Thought to be a benign perifissural lymph node.  3.  Elevated LFTs: -Likely secondary to alcohol abuse. -PET scan did not reveal any acute liver abnormalities.  4.  Contact dermatitis of anterior hands: -Unclear etiology. -Prescription given for Kenalog cream sent to [facility].  5.  Recent COVID-19 infection: -Unclear of how long he has had this. -Although he is asymptomatic he  appears to have had this at least since 07/11/2020 given CT of his chest showed an atypical infection.  That would likely mean he is having infection for greater than 2 weeks.  Disposition: RTC in 6 months for repeat labs (CBC, chromogranin A, LDH, CMP, vitamin B12, vitamin D) and assessment.  No problem-specific Assessment & Plan notes found for this encounter.   Orders placed this encounter:  No orders of the defined types were placed in this encounter.  [place] [phone]

## 2020-08-02 ENCOUNTER — Ambulatory Visit (HOSPITAL_COMMUNITY): Payer: Medicare Other | Admitting: Oncology

## 2020-08-02 DIAGNOSIS — E785 Hyperlipidemia, unspecified: Secondary | ICD-10-CM | POA: Diagnosis not present

## 2020-08-02 DIAGNOSIS — E559 Vitamin D deficiency, unspecified: Secondary | ICD-10-CM | POA: Diagnosis not present

## 2020-08-02 DIAGNOSIS — N390 Urinary tract infection, site not specified: Secondary | ICD-10-CM | POA: Diagnosis not present

## 2020-08-02 DIAGNOSIS — I10 Essential (primary) hypertension: Secondary | ICD-10-CM | POA: Diagnosis not present

## 2020-08-14 ENCOUNTER — Other Ambulatory Visit: Payer: Self-pay | Admitting: Orthopaedic Surgery

## 2020-08-14 MED ORDER — HYDROCODONE-ACETAMINOPHEN 5-325 MG PO TABS: ORAL_TABLET | ORAL | 0 refills | Status: DC

## 2020-08-14 NOTE — Telephone Encounter (Signed)
Patient requests refill - aware Dr Luna Glasgow is out of clinic until 1st of year. HYDROcodone-acetaminophen (NORCO/VICODIN) 5-325 MG tablet  - [facility]

## 2020-08-14 NOTE — Telephone Encounter (Signed)
Done

## 2020-08-14 NOTE — Telephone Encounter (Signed)
Wrong one but I did it anyway

## 2020-08-20 DIAGNOSIS — E039 Hypothyroidism, unspecified: Secondary | ICD-10-CM | POA: Diagnosis not present

## 2020-08-20 DIAGNOSIS — E785 Hyperlipidemia, unspecified: Secondary | ICD-10-CM | POA: Diagnosis not present

## 2020-08-20 DIAGNOSIS — D509 Iron deficiency anemia, unspecified: Secondary | ICD-10-CM | POA: Diagnosis not present

## 2020-08-20 DIAGNOSIS — I10 Essential (primary) hypertension: Secondary | ICD-10-CM | POA: Diagnosis not present

## 2020-08-20 DIAGNOSIS — E559 Vitamin D deficiency, unspecified: Secondary | ICD-10-CM | POA: Diagnosis not present

## 2020-09-17 ENCOUNTER — Telehealth: Payer: Self-pay | Admitting: Orthopaedic Surgery

## 2020-09-17 MED ORDER — HYDROCODONE-ACETAMINOPHEN 5-325 MG PO TABS: ORAL_TABLET | ORAL | 0 refills | Status: DC

## 2020-09-17 NOTE — Telephone Encounter (Signed)
Patient requests refill on Hydrocodone/Acetaminophen 5-325 Mgs. Qty56  Sig:One tablet by mouth every six hours as needed for pain. Must last 30 days.  Patient states he uses [facility]

## 2020-09-26 ENCOUNTER — Ambulatory Visit: Payer: Medicare Other | Admitting: Orthopaedic Surgery

## 2020-10-01 ENCOUNTER — Ambulatory Visit (INDEPENDENT_AMBULATORY_CARE_PROVIDER_SITE_OTHER): Payer: 59 | Admitting: Orthopaedic Surgery

## 2020-10-01 ENCOUNTER — Encounter: Payer: Self-pay | Admitting: Orthopaedic Surgery

## 2020-10-01 ENCOUNTER — Other Ambulatory Visit: Payer: Self-pay

## 2020-10-01 VITALS — BP 145/84 | HR 99 | Ht 75.0 in | Wt 231.0 lb

## 2020-10-01 DIAGNOSIS — M1A0410 Idiopathic chronic gout, right hand, without tophus (tophi): Secondary | ICD-10-CM

## 2020-10-01 DIAGNOSIS — G894 Chronic pain syndrome: Secondary | ICD-10-CM

## 2020-10-01 NOTE — Progress Notes (Signed)
Patient [name], male DOB:[birth year], 70 y.o. TIR:[id number]  Chief Complaint  Patient presents with  . Gout    HPI  [name] is a 70 y.o. male who has chronic gout affecting his hands.  He had an attack several months ago but is doing well now.  He has some swelling, some pain.  He is taking his medicine.  He has no trauma.   Body mass index is 28.87 kg/m.  ROS  Review of Systems  HENT: Negative for congestion.   Respiratory: Negative for cough and shortness of breath.   Cardiovascular: Negative for chest pain and leg swelling.  Endocrine: Positive for cold intolerance.  Musculoskeletal: Positive for arthralgias and joint swelling.  Allergic/Immunologic: Positive for environmental allergies.  All other systems reviewed and are negative.   All other systems reviewed and are negative.  The following is a summary of the past history medically, past history surgically, known current medicines, social history and family history.  This information is gathered electronically by the computer from prior information and documentation.  I review this each visit and have found including this information at this point in the chart is beneficial and informative.    Past Medical History:  Diagnosis Date  . Anemia    FeDA: GASTRIC POLYPS, B12; TCS 2008 EGD 2009, 2008-HB 11.1 MCV 83.6 CR 1.22, 2009 FERRITIN 102-22  . B12 deficiency   . Carcinoid tumor determined by biopsy of stomach 09/25/2014  . Chronic knee pain   . Diverticulosis of colon    Lower GI bleed 2008  . Essential hypertension   . GERD (gastroesophageal reflux disease)   . Gout   . Hepatomegaly    Hepatic steatosis  . History of alcohol abuse   . History of septic arthritis   . Hypothyroidism   . PUD   . Substance abuse [facility])     Past Surgical History:  Procedure Laterality Date  . BIOPSY  08/14/2014   Procedure: BIOPSY;  Surgeon: Danie Binder, MD;  Location: [facility];  Service: Endoscopy;;  . BIOPSY   03/12/2015   Procedure: BIOPSY;  Surgeon: Danie Binder, MD;  Location: [facility];  Service: Endoscopy;;  . BIOPSY  04/21/2016   Procedure: BIOPSY;  Surgeon: Danie Binder, MD;  [facility];  Service: Endoscopy;;  bx's of antrum, body of stomach, fundus, and cardia  . CHOLECYSTECTOMY    . COLONOSCOPY  2008 [facility] DJ   LGIB 2o to TICS, prep good  . COLONOSCOPY WITH PROPOFOL N/A 08/14/2014   SLF: 1. Four large colon polyps removed. 2. The left colon is redundant 3. Moderate diverticulosis throughout teh entire examined colon  . COLONOSCOPY WITH PROPOFOL N/A 11/06/2014   SLF: 8 small polyps removed. One large pedunculated polyp removed from the ascending colon, tubulovillous and tubular adenomas. Next colonoscopy March 2019  . COLONOSCOPY WITH PROPOFOL N/A 01/04/2018   Procedure: COLONOSCOPY WITH PROPOFOL;  Surgeon: Danie Binder, MD;  [facility];  Service: Endoscopy;  Laterality: N/A;  10:00am  . ESOPHAGOGASTRODUODENOSCOPY  12/08/2007   QPY:PPJK gastric polyps seen in the cardia and body of the stomach/Normal esophagus without evidence of Barrett's, mass, erosion/Normal duodenal bulb and second portion of the duodenum. Benign bx.  . ESOPHAGOGASTRODUODENOSCOPY (EGD) WITH PROPOFOL N/A 08/14/2014   SLF: 1. Heme postive stools due to gastric and colon polyps 2. Multiple gastric   . ESOPHAGOGASTRODUODENOSCOPY (EGD) WITH PROPOFOL N/A 03/12/2015   SLF: Multiple gastric nodules seen in gasric body/antrum. 2.  Non-erosive gastritis ( inflammation) was found in the gastric antrum.   . ESOPHAGOGASTRODUODENOSCOPY (EGD) WITH PROPOFOL N/A 10/08/2015   Procedure: ESOPHAGOGASTRODUODENOSCOPY (EGD) WITH PROPOFOL;  Surgeon: Danie Binder, MD;  [facility];  Service: Endoscopy;  Laterality: N/A;  0945  . ESOPHAGOGASTRODUODENOSCOPY (EGD) WITH PROPOFOL N/A 04/21/2016   Procedure: ESOPHAGOGASTRODUODENOSCOPY (EGD) WITH PROPOFOL;  Surgeon: Danie Binder, MD;  [facility];  Service:  Endoscopy;  Laterality: N/A;  730   . HAND SURGERY Right    fracture repair with plates  . INGUINAL HERNIA REPAIR Left 11/09/2018   Procedure: HERNIA REPAIR INGUINAL ADULT WITH MESH;  Surgeon: Virl Cagey, MD;  Location: [facility];  Service: General;  Laterality: Left;  . JOINT REPLACEMENT Bilateral    knees  . POLYPECTOMY  08/14/2014   Procedure: POLYPECTOMY;  Surgeon: Danie Binder, MD;  Location: [facility];  Service: Endoscopy;;  . POLYPECTOMY N/A 11/06/2014   Procedure: POLYPECTOMY;  Surgeon: Danie Binder, MD;  Location: [facility];  Service: Endoscopy;  Laterality: N/A;  Transverse Colon x3, Ascending Colon x2, Descending Colon x3  . REPLACEMENT TOTAL KNEE BILATERAL    . UPPER GASTROINTESTINAL ENDOSCOPY  APR 2009   INFLAMED HYPERPLASTIC POLYPS, CHRONIC GASTRITIS    Family History  Problem Relation Age of Onset  . Diabetes Mother   . Renal Disease Mother        failure  . Diabetes Father        'old age'  . Hypertension Sister   . Hypertension Brother   . Diabetes Brother   . Diabetes Sister   . Diabetes Sister   . Diabetes Brother   . Hypertension Brother   . Diabetes Brother        right BKA  . Kidney failure Brother        on dialysis  . Kidney failure Brother        on dialysis  . Diabetes Brother        bilateral BKA  . Colon polyps Neg Hx   . Colon cancer Neg Hx   . Pancreatic disease Neg Hx     Social History Social History   Tobacco Use  . Smoking status: Never Smoker  . Smokeless tobacco: Never Used  . Tobacco comment: Quit x 10 years/ never smoked on regular basis  Vaping Use  . Vaping Use: Never used  Substance Use Topics  . Alcohol use: Yes    Alcohol/week: 2.0 standard drinks    Types: 2 Shots of liquor per week    Comment: drinks on weekends, gin/vodka 1/5th.  . Drug use: No    Allergies  Allergen Reactions  . Aspirin Other (See Comments)    REACTION: Diverticular Bleed    Current Outpatient Medications  Medication Sig Dispense Refill   . amLODipine (NORVASC) 5 MG tablet TAKE 1 TABLET BY MOUTH ONCE DAILY. 90 tablet 3  . atorvastatin (LIPITOR) 20 MG tablet Take 1 tablet (20 mg total) by mouth daily. 90 tablet 3  . cephALEXin (KEFLEX) 500 MG capsule Take 1 capsule (500 mg total) by mouth 4 (four) times daily. (Patient not taking: Reported on 10/01/2020) 28 capsule 0  . clindamycin (CLINDAGEL) 1 % gel Apply 1 application topically daily. Applies to back of neck    . colchicine 0.6 MG tablet TAKE 1 TABLET BY MOUTH 3 TIMES DAILY X5 DAYS FOR GOUT PAIN. 15 tablet 4  . HYDROcodone-acetaminophen (NORCO/VICODIN) 5-325 MG tablet One tablet by mouth every six  hours as needed for pain.  Must last 30 days. 56 tablet 0  . iron polysaccharides (NIFEREX) 150 MG capsule Take 1 capsule (150 mg total) by mouth daily. 90 capsule 1  . levothyroxine (SYNTHROID, LEVOTHROID) 200 MCG tablet Take 1 tablet (200 mcg total) by mouth daily before breakfast. 90 tablet 3  . metoprolol succinate (TOPROL-XL) 25 MG 24 hr tablet Take 1.5 tablets (37.5 mg total) by mouth daily. 45 tablet 6  . omeprazole (PRILOSEC) 20 MG capsule TAKE 1 CAPSULE BY MOUTH ONCE DAILY. (Patient taking differently: Take 20 mg by mouth daily. ) 30 capsule 3  . ondansetron (ZOFRAN) 4 MG tablet Take 4 mg by mouth 3 (three) times daily as needed. (Patient not taking: Reported on 07/30/2020)    . sildenafil (VIAGRA) 100 MG tablet Take 50 mg by mouth daily as needed.    . tamsulosin (FLOMAX) 0.4 MG CAPS capsule Take 1 capsule (0.4 mg total) by mouth daily after supper. (Patient taking differently: Take 0.4 mg by mouth daily. ) 30 capsule 3  . triamcinolone ointment (KENALOG) 0.5 % Apply 1 application topically 2 (two) times daily. 30 g 0  . ULORIC 40 MG tablet TAKE 1 TABLET BY MOUTH ONCE A DAY. (Patient taking differently: Take 40 mg by mouth daily. ) 90 tablet 0   No current facility-administered medications for this visit.     Physical Exam  Blood pressure (!) 145/84, pulse 99, height 6\' 3"   (1.905 m), weight 231 lb (104.8 kg).  Constitutional: overall normal hygiene, normal nutrition, well developed, normal grooming, normal body habitus. Assistive device:cane  Musculoskeletal: gait and station Limp left, muscle tone and strength are normal, no tremors or atrophy is present.  .  Neurological: coordination overall normal.  Deep tendon reflex/nerve stretch intact.  Sensation normal.  Cranial nerves II-XII intact.   Skin:   Normal overall no scars, lesions, ulcers or rashes. No psoriasis.  Psychiatric: Alert and oriented x 3.  Recent memory intact, remote memory unclear.  Normal mood and affect. Well groomed.  Good eye contact.  Cardiovascular: overall no swelling, no varicosities, no edema bilaterally, normal temperatures of the legs and arms, no clubbing, cyanosis and good capillary refill.  Both hands have some swelling, King on the right.  Lymphatic: palpation is normal.  All other systems reviewed and are negative   The patient has been educated about the nature of the problem(s) and counseled on treatment options.  The patient appeared to understand what I have discussed and is in agreement with it.  Encounter Diagnoses  Name Primary?  . 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 Chronic pain disorder     PLAN Call if any problems.  Precautions discussed.  Continue current medications.   Return to clinic 3 months   Electronically [place], MD 2/1/20228:34 AM

## 2020-10-16 ENCOUNTER — Telehealth: Payer: Self-pay | Admitting: Orthopaedic Surgery

## 2020-10-16 MED ORDER — HYDROCODONE-ACETAMINOPHEN 5-325 MG PO TABS: ORAL_TABLET | ORAL | 0 refills | Status: DC

## 2020-10-16 NOTE — Telephone Encounter (Signed)
Patient called and requested a refill for there pain medicine   HYDROcodone-acetaminophen (NORCO/VICODIN) 5-325 MG tablet    Pharmacy: [facility]

## 2020-11-12 ENCOUNTER — Telehealth: Payer: Self-pay | Admitting: Orthopaedic Surgery

## 2020-11-12 MED ORDER — HYDROCODONE-ACETAMINOPHEN 5-325 MG PO TABS: ORAL_TABLET | ORAL | 0 refills | Status: DC

## 2020-11-12 NOTE — Telephone Encounter (Signed)
Patient requests Hydrocodone/Acetaminophen 5-325 Mgs. Qty56  Sig:One tablet by mouth every six hours as needed for pain. Must last 30 days.  Patient states he uses [facility]

## 2020-12-03 ENCOUNTER — Telehealth: Payer: Self-pay | Admitting: Orthopaedic Surgery

## 2020-12-16 ENCOUNTER — Telehealth: Payer: Self-pay | Admitting: Orthopaedic Surgery

## 2020-12-16 MED ORDER — HYDROCODONE-ACETAMINOPHEN 5-325 MG PO TABS: ORAL_TABLET | ORAL | 0 refills | Status: DC

## 2020-12-16 NOTE — Telephone Encounter (Signed)
Patient requests Hydrocodone/Acetaminophen 5-325 Mgs. Qty56  Sig:One tablet by mouth every six hours as needed for pain. Must last 30 days.  Patient states he uses [facility]

## 2020-12-31 ENCOUNTER — Ambulatory Visit (INDEPENDENT_AMBULATORY_CARE_PROVIDER_SITE_OTHER): Payer: 59 | Admitting: Orthopaedic Surgery

## 2020-12-31 ENCOUNTER — Encounter: Payer: Self-pay | Admitting: Orthopaedic Surgery

## 2020-12-31 ENCOUNTER — Other Ambulatory Visit: Payer: Self-pay

## 2020-12-31 VITALS — BP 168/108 | HR 83 | Ht 74.0 in | Wt 243.4 lb

## 2020-12-31 DIAGNOSIS — M1A0410 Idiopathic chronic gout, right hand, without tophus (tophi): Secondary | ICD-10-CM | POA: Diagnosis not present

## 2020-12-31 DIAGNOSIS — G894 Chronic pain syndrome: Secondary | ICD-10-CM | POA: Diagnosis not present

## 2020-12-31 MED ORDER — FEBUXOSTAT 40 MG PO TABS
40.0000 mg | ORAL_TABLET | Freq: Every day | ORAL | 5 refills | Status: DC
Start: 2020-12-31 — End: 2023-10-06

## 2020-12-31 NOTE — Progress Notes (Signed)
Patient UD:[name], male DOB:05-31-1951, 71 y.o. YOV:[id number]  Chief Complaint  Patient presents with  . Hand Pain    R/ my gout is doing better since I am on the medicine/ need refill on it.    HPI  [name] is a 70 y.o. male who has gout of the hands and wrist more on the right.  He is out of his gout medicine. I will refill.  He has not had an attack while taking the Uloric.  He has no other injury or problem today.   Body mass index is 31.25 kg/m.  ROS  Review of Systems  HENT: Negative for congestion.   Respiratory: Negative for cough and shortness of breath.   Cardiovascular: Negative for chest pain and leg swelling.  Endocrine: Positive for cold intolerance.  Musculoskeletal: Positive for arthralgias and joint swelling.  Allergic/Immunologic: Positive for environmental allergies.  All other systems reviewed and are negative.   All other systems reviewed and are negative.  The following is a summary of the past history medically, past history surgically, known current medicines, social history and family history.  This information is gathered electronically by the computer from prior information and documentation.  I review this each visit and have found including this information at this point in the chart is beneficial and informative.    Past Medical History:  Diagnosis Date  . Anemia    FeDA: GASTRIC POLYPS, B12; TCS 2008 EGD 2009, 2008-HB 11.1 MCV 83.6 CR 1.22, 2009 FERRITIN 102-22  . B12 deficiency   . Carcinoid tumor determined by biopsy of stomach 09/25/2014  . Chronic knee pain   . Diverticulosis of colon    Lower GI bleed 2008  . Essential hypertension   . GERD (gastroesophageal reflux disease)   . Gout   . Hepatomegaly    Hepatic steatosis  . History of alcohol abuse   . History of septic arthritis   . Hypothyroidism   . PUD   . Substance abuse [facility])     Past Surgical History:  Procedure Laterality Date  . BIOPSY  08/14/2014    Procedure: BIOPSY;  Surgeon: Danie Binder, MD;  Location: [facility];  Service: Endoscopy;;  . BIOPSY  03/12/2015   Procedure: BIOPSY;  Surgeon: Danie Binder, MD;  Location: [facility];  Service: Endoscopy;;  . BIOPSY  04/21/2016   Procedure: BIOPSY;  Surgeon: Danie Binder, MD;  [facility];  Service: Endoscopy;;  bx's of antrum, body of stomach, fundus, and cardia  . CHOLECYSTECTOMY    . COLONOSCOPY  2008 [facility] DJ   LGIB 2o to TICS, prep good  . COLONOSCOPY WITH PROPOFOL N/A 08/14/2014   SLF: 1. Four large colon polyps removed. 2. The left colon is redundant 3. Moderate diverticulosis throughout teh entire examined colon  . COLONOSCOPY WITH PROPOFOL N/A 11/06/2014   SLF: 8 small polyps removed. One large pedunculated polyp removed from the ascending colon, tubulovillous and tubular adenomas. Next colonoscopy March 2019  . COLONOSCOPY WITH PROPOFOL N/A 01/04/2018   Procedure: COLONOSCOPY WITH PROPOFOL;  Surgeon: Danie Binder, MD;  [facility];  Service: Endoscopy;  Laterality: N/A;  10:00am  . ESOPHAGOGASTRODUODENOSCOPY  12/08/2007   XAJ:OINO gastric polyps seen in the cardia and body of the stomach/Normal esophagus without evidence of Barrett's, mass, erosion/Normal duodenal bulb and second portion of the duodenum. Benign bx.  . ESOPHAGOGASTRODUODENOSCOPY (EGD) WITH PROPOFOL N/A 08/14/2014   SLF: 1. Heme postive stools due to gastric and  colon polyps 2. Multiple gastric   . ESOPHAGOGASTRODUODENOSCOPY (EGD) WITH PROPOFOL N/A 03/12/2015   SLF: Multiple gastric nodules seen in gasric body/antrum. 2. Non-erosive gastritis ( inflammation) was found in the gastric antrum.   . ESOPHAGOGASTRODUODENOSCOPY (EGD) WITH PROPOFOL N/A 10/08/2015   Procedure: ESOPHAGOGASTRODUODENOSCOPY (EGD) WITH PROPOFOL;  Surgeon: Danie Binder, MD;  [facility];  Service: Endoscopy;  Laterality: N/A;  0945  . ESOPHAGOGASTRODUODENOSCOPY (EGD) WITH PROPOFOL N/A 04/21/2016   Procedure:  ESOPHAGOGASTRODUODENOSCOPY (EGD) WITH PROPOFOL;  Surgeon: Danie Binder, MD;  [facility];  Service: Endoscopy;  Laterality: N/A;  730   . HAND SURGERY Right    fracture repair with plates  . INGUINAL HERNIA REPAIR Left 11/09/2018   Procedure: HERNIA REPAIR INGUINAL ADULT WITH MESH;  Surgeon: Virl Cagey, MD;  Location: [facility];  Service: General;  Laterality: Left;  . JOINT REPLACEMENT Bilateral    knees  . POLYPECTOMY  08/14/2014   Procedure: POLYPECTOMY;  Surgeon: Danie Binder, MD;  Location: [facility];  Service: Endoscopy;;  . POLYPECTOMY N/A 11/06/2014   Procedure: POLYPECTOMY;  Surgeon: Danie Binder, MD;  Location: [facility];  Service: Endoscopy;  Laterality: N/A;  Transverse Colon x3, Ascending Colon x2, Descending Colon x3  . REPLACEMENT TOTAL KNEE BILATERAL    . UPPER GASTROINTESTINAL ENDOSCOPY  APR 2009   INFLAMED HYPERPLASTIC POLYPS, CHRONIC GASTRITIS    Family History  Problem Relation Age of Onset  . Diabetes Mother   . Renal Disease Mother        failure  . Diabetes Father        'old age'  . Hypertension Sister   . Hypertension Brother   . Diabetes Brother   . Diabetes Sister   . Diabetes Sister   . Diabetes Brother   . Hypertension Brother   . Diabetes Brother        right BKA  . Kidney failure Brother        on dialysis  . Kidney failure Brother        on dialysis  . Diabetes Brother        bilateral BKA  . Colon polyps Neg Hx   . Colon cancer Neg Hx   . Pancreatic disease Neg Hx     Social History Social History   Tobacco Use  . Smoking status: Never Smoker  . Smokeless tobacco: Never Used  . Tobacco comment: Quit x 10 years/ never smoked on regular basis  Vaping Use  . Vaping Use: Never used  Substance Use Topics  . Alcohol use: Yes    Alcohol/week: 2.0 standard drinks    Types: 2 Shots of liquor per week    Comment: drinks on weekends, gin/vodka 1/5th.  . Drug use: No    Allergies  Allergen Reactions  . Aspirin Other  (See Comments)    REACTION: Diverticular Bleed    Current Outpatient Medications  Medication Sig Dispense Refill  . amLODipine (NORVASC) 5 MG tablet TAKE 1 TABLET BY MOUTH ONCE DAILY. 90 tablet 3  . atorvastatin (LIPITOR) 20 MG tablet Take 1 tablet (20 mg total) by mouth daily. 90 tablet 3  . clindamycin (CLINDAGEL) 1 % gel Apply 1 application topically daily. Applies to back of neck    . colchicine 0.6 MG tablet TAKE 1 TABLET BY MOUTH 3 TIMES DAILY FOR 5 DAYS FOR GOUT PAIN. 15 tablet 4  . HYDROcodone-acetaminophen (NORCO/VICODIN) 5-325 MG tablet One tablet by mouth every six hours as  needed for pain.  Must last 30 days. 56 tablet 0  . iron polysaccharides (NIFEREX) 150 MG capsule Take 1 capsule (150 mg total) by mouth daily. 90 capsule 1  . levothyroxine (SYNTHROID, LEVOTHROID) 200 MCG tablet Take 1 tablet (200 mcg total) by mouth daily before breakfast. 90 tablet 3  . metoprolol succinate (TOPROL-XL) 25 MG 24 hr tablet Take 1.5 tablets (37.5 mg total) by mouth daily. 45 tablet 6  . omeprazole (PRILOSEC) 20 MG capsule TAKE 1 CAPSULE BY MOUTH ONCE DAILY. (Patient taking differently: Take 20 mg by mouth daily.) 30 capsule 3  . ondansetron (ZOFRAN) 4 MG tablet Take 4 mg by mouth 3 (three) times daily as needed.    . sildenafil (VIAGRA) 100 MG tablet Take 50 mg by mouth daily as needed.    . tamsulosin (FLOMAX) 0.4 MG CAPS capsule Take 1 capsule (0.4 mg total) by mouth daily after supper. (Patient taking differently: Take 0.4 mg by mouth daily.) 30 capsule 3  . triamcinolone ointment (KENALOG) 0.5 % Apply 1 application topically 2 (two) times daily. 30 g 0  . febuxostat (ULORIC) 40 MG tablet Take 1 tablet (40 mg total) by mouth daily. 90 tablet 5   No current facility-administered medications for this visit.     Physical Exam  Blood pressure (!) 168/108, pulse 83, height 6\' 2"  (1.88 m), weight 243 lb 6 oz (110.4 kg).  Constitutional: overall normal hygiene, normal nutrition, well developed,  normal grooming, normal body habitus. Assistive device:none  Musculoskeletal: gait and station Limp none, muscle tone and strength are normal, no tremors or atrophy is present.  .  Neurological: coordination overall normal.  Deep tendon reflex/nerve stretch intact.  Sensation normal.  Cranial nerves II-XII intact.   Skin:   Normal overall no scars, lesions, ulcers or rashes. No psoriasis.  Psychiatric: Alert and oriented x 3.  Recent memory intact, remote memory unclear.  Normal mood and affect. Well groomed.  Good eye contact.  Cardiovascular: overall no swelling, no varicosities, no edema bilaterally, normal temperatures of the legs and arms, no clubbing, cyanosis and good capillary refill.  Lymphatic: palpation is normal.  Right wrist with some radial deformity from gout.  ROM is good, not painful today.  NV intact.  All other systems reviewed and are negative   The patient has been educated about the nature of the problem(s) and counseled on treatment options.  The patient appeared to understand what I have discussed and is in agreement with it.  Encounter Diagnoses  Name Primary?  . 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 Chronic pain disorder     PLAN Call if any problems.  Precautions discussed.  Continue current medications.   Return to clinic 3 months   Electronically Signed Sanjuana Kava, MD 5/3/20229:15 AM

## 2021-01-14 ENCOUNTER — Telehealth: Payer: Self-pay | Admitting: Orthopaedic Surgery

## 2021-01-14 NOTE — Telephone Encounter (Signed)
Patient requests refill HYDROcodone-acetaminophen (NORCO/VICODIN) 5-325 MG tablet 56 tablet  [facility]

## 2021-01-15 MED ORDER — HYDROCODONE-ACETAMINOPHEN 5-325 MG PO TABS: ORAL_TABLET | ORAL | 0 refills | Status: DC

## 2021-01-15 NOTE — Telephone Encounter (Signed)
Patient states his pain medication refill did not get to [place] per request yesterday, 01/14/21. Please advise.

## 2021-01-31 ENCOUNTER — Other Ambulatory Visit (HOSPITAL_COMMUNITY): Payer: Self-pay | Admitting: *Deleted

## 2021-01-31 DIAGNOSIS — D500 Iron deficiency anemia secondary to blood loss (chronic): Secondary | ICD-10-CM

## 2021-01-31 DIAGNOSIS — D3A8 Other benign neuroendocrine tumors: Secondary | ICD-10-CM

## 2021-02-03 ENCOUNTER — Other Ambulatory Visit: Payer: Self-pay

## 2021-02-03 ENCOUNTER — Inpatient Hospital Stay (HOSPITAL_COMMUNITY): Payer: 59 | Attending: Hematology

## 2021-02-03 DIAGNOSIS — C7A092 Malignant carcinoid tumor of the stomach: Secondary | ICD-10-CM | POA: Insufficient documentation

## 2021-02-03 DIAGNOSIS — Z79899 Other long term (current) drug therapy: Secondary | ICD-10-CM | POA: Diagnosis not present

## 2021-02-03 DIAGNOSIS — D500 Iron deficiency anemia secondary to blood loss (chronic): Secondary | ICD-10-CM

## 2021-02-03 DIAGNOSIS — E559 Vitamin D deficiency, unspecified: Secondary | ICD-10-CM | POA: Insufficient documentation

## 2021-02-03 DIAGNOSIS — R7989 Other specified abnormal findings of blood chemistry: Secondary | ICD-10-CM | POA: Diagnosis not present

## 2021-02-03 DIAGNOSIS — D3A8 Other benign neuroendocrine tumors: Secondary | ICD-10-CM

## 2021-02-03 LAB — COMPREHENSIVE METABOLIC PANEL
ALT: 35 U/L (ref 0–44)
AST: 56 U/L — ABNORMAL HIGH (ref 15–41)
Albumin: 4 g/dL (ref 3.5–5.0)
Alkaline Phosphatase: 50 U/L (ref 38–126)
Anion gap: 9 (ref 5–15)
BUN: 16 mg/dL (ref 8–23)
CO2: 28 mmol/L (ref 22–32)
Calcium: 9.1 mg/dL (ref 8.9–10.3)
Chloride: 101 mmol/L (ref 98–111)
Creatinine, Ser: 1.44 mg/dL — ABNORMAL HIGH (ref 0.61–1.24)
GFR, Estimated: 52 mL/min — ABNORMAL LOW (ref >60)
Glucose, Bld: 114 mg/dL — ABNORMAL HIGH (ref 70–99)
Potassium: 4 mmol/L (ref 3.5–5.1)
Sodium: 138 mmol/L (ref 135–145)
Total Bilirubin: 1.3 mg/dL — ABNORMAL HIGH (ref 0.3–1.2)
Total Protein: 8.1 g/dL (ref 6.5–8.1)

## 2021-02-03 LAB — CBC WITH DIFFERENTIAL/PLATELET
Abs Immature Granulocytes: 0.01 10*3/uL (ref 0.00–0.07)
Basophils Absolute: 0 10*3/uL (ref 0.0–0.1)
Basophils Relative: 1 %
Eosinophils Absolute: 0.2 10*3/uL (ref 0.0–0.5)
Eosinophils Relative: 4 %
HCT: 41 % (ref 39.0–52.0)
Hemoglobin: 13.7 g/dL (ref 13.0–17.0)
Immature Granulocytes: 0 %
Lymphocytes Relative: 42 %
Lymphs Abs: 1.8 10*3/uL (ref 0.7–4.0)
MCH: 34.7 pg — ABNORMAL HIGH (ref 26.0–34.0)
MCHC: 33.4 g/dL (ref 30.0–36.0)
MCV: 103.8 fL — ABNORMAL HIGH (ref 80.0–100.0)
Monocytes Absolute: 0.4 10*3/uL (ref 0.1–1.0)
Monocytes Relative: 11 %
Neutro Abs: 1.7 10*3/uL (ref 1.7–7.7)
Neutrophils Relative %: 42 %
Platelets: 104 10*3/uL — ABNORMAL LOW (ref 150–400)
RBC: 3.95 MIL/uL — ABNORMAL LOW (ref 4.22–5.81)
RDW: 12.7 % (ref 11.5–15.5)
WBC: 4.1 10*3/uL (ref 4.0–10.5)
nRBC: 0 % (ref 0.0–0.2)

## 2021-02-03 LAB — LACTATE DEHYDROGENASE: LDH: 179 U/L (ref 98–192)

## 2021-02-03 LAB — VITAMIN B12: Vitamin B-12: 985 pg/mL — ABNORMAL HIGH (ref 180–914)

## 2021-02-04 LAB — VITAMIN D 25 HYDROXY (VIT D DEFICIENCY, FRACTURES): Vit D, 25-Hydroxy: 28.22 ng/mL — ABNORMAL LOW (ref 30–100)

## 2021-02-04 LAB — CHROMOGRANIN A: Chromogranin A (ng/mL): 131.2 ng/mL — ABNORMAL HIGH (ref 0.0–101.8)

## 2021-02-06 NOTE — Progress Notes (Signed)
[name], [place] [id number]   CLINIC:  Medical Oncology/Hematology  PCP:  Eloise Harman, DO [address] / [place] [address] [phone]   REASON FOR VISIT:  Follow-up for neuroendocrine tumor  CURRENT THERAPY: surveillance  BRIEF ONCOLOGIC HISTORY:  Oncology History   No history exists.    CANCER STAGING: Cancer Staging No matching staging information was found for the patient.  INTERVAL HISTORY:  [name], a 70 y.o. male, returns for routine follow-up of his neuroendocrine tumor. [name] was last seen on 07/30/2020.   Today he reports feeling well. He denies any diarrhea, constipation, abdominal pain, or blood in stools or urine. He is not currently taking any vitamin D. He denies any flushing or wheezing. He reports occasional high blood pressure.   REVIEW OF SYSTEMS:  Review of Systems  Constitutional:  Positive for appetite change (50%) and fatigue (50%).  Respiratory:  Negative for wheezing.   Gastrointestinal:  Negative for abdominal pain, blood in stool, constipation and diarrhea.  Genitourinary:  Negative for hematuria.   All other systems reviewed and are negative.  PAST MEDICAL/SURGICAL HISTORY:  Past Medical History:  Diagnosis Date   Anemia    FeDA: GASTRIC POLYPS, B12; TCS 2008 EGD 2009, 2008-HB 11.1 MCV 83.6 CR 1.22, 2009 FERRITIN 102-22   B12 deficiency    Carcinoid tumor determined by biopsy of stomach 09/25/2014   Chronic knee pain    Diverticulosis of colon    Lower GI bleed 2008   Essential hypertension    GERD (gastroesophageal reflux disease)    Gout    Hepatomegaly    Hepatic steatosis   History of alcohol abuse    History of septic arthritis    Hypothyroidism    PUD    Substance abuse ([place])    Past Surgical History:  Procedure Laterality Date   BIOPSY  08/14/2014   Procedure: BIOPSY;  Surgeon: Danie Binder, MD;  Location: [facility];  Service: Endoscopy;;   BIOPSY  03/12/2015    Procedure: BIOPSY;  Surgeon: Danie Binder, MD;  Location: [facility];  Service: Endoscopy;;   BIOPSY  04/21/2016   Procedure: BIOPSY;  Surgeon: Danie Binder, MD;  [facility];  Service: Endoscopy;;  bx's of antrum, body of stomach, fundus, and cardia   CHOLECYSTECTOMY     COLONOSCOPY  2008 [facility] DJ   LGIB 2o to TICS, prep good   COLONOSCOPY WITH PROPOFOL N/A 08/14/2014   SLF: 1. Four large colon polyps removed. 2. The left colon is redundant 3. Moderate diverticulosis throughout teh entire examined colon   COLONOSCOPY WITH PROPOFOL N/A 11/06/2014   SLF: 8 small polyps removed. One large pedunculated polyp removed from the ascending colon, tubulovillous and tubular adenomas. Next colonoscopy March 2019   COLONOSCOPY WITH PROPOFOL N/A 01/04/2018   Procedure: COLONOSCOPY WITH PROPOFOL;  Surgeon: Danie Binder, MD;  [facility];  Service: Endoscopy;  Laterality: N/A;  10:00am   ESOPHAGOGASTRODUODENOSCOPY  12/08/2007   JME:QAST gastric polyps seen in the cardia and body of the stomach/Normal esophagus without evidence of Barrett's, mass, erosion/Normal duodenal bulb and second portion of the duodenum. Benign bx.   ESOPHAGOGASTRODUODENOSCOPY (EGD) WITH PROPOFOL N/A 08/14/2014   SLF: 1. Heme postive stools due to gastric and colon polyps 2. Multiple gastric    ESOPHAGOGASTRODUODENOSCOPY (EGD) WITH PROPOFOL N/A 03/12/2015   SLF: Multiple gastric nodules seen in gasric body/antrum. 2. Non-erosive gastritis ( inflammation) was found  in the gastric antrum.    ESOPHAGOGASTRODUODENOSCOPY (EGD) WITH PROPOFOL N/A 10/08/2015   Procedure: ESOPHAGOGASTRODUODENOSCOPY (EGD) WITH PROPOFOL;  Surgeon: Danie Binder, MD;  [facility];  Service: Endoscopy;  Laterality: N/A;  0945   ESOPHAGOGASTRODUODENOSCOPY (EGD) WITH PROPOFOL N/A 04/21/2016   Procedure: ESOPHAGOGASTRODUODENOSCOPY (EGD) WITH PROPOFOL;  Surgeon: Danie Binder, MD;  [facility];  Service: Endoscopy;  Laterality:  N/A;  730    HAND SURGERY Right    fracture repair with plates   INGUINAL HERNIA REPAIR Left 11/09/2018   Procedure: HERNIA REPAIR INGUINAL ADULT WITH MESH;  Surgeon: Virl Cagey, MD;  Location: [facility];  Service: General;  Laterality: Left;   JOINT REPLACEMENT Bilateral    knees   POLYPECTOMY  08/14/2014   Procedure: POLYPECTOMY;  Surgeon: Danie Binder, MD;  Location: [facility];  Service: Endoscopy;;   POLYPECTOMY N/A 11/06/2014   Procedure: POLYPECTOMY;  Surgeon: Danie Binder, MD;  Location: [facility];  Service: Endoscopy;  Laterality: N/A;  Transverse Colon x3, Ascending Colon x2, Descending Colon x3   REPLACEMENT TOTAL KNEE BILATERAL     UPPER GASTROINTESTINAL ENDOSCOPY  APR 2009   INFLAMED HYPERPLASTIC POLYPS, CHRONIC GASTRITIS    SOCIAL HISTORY:  Social History   Socioeconomic History   Marital status: Single    Spouse name: Not on file   Number of children: 0   Years of education: 3   Highest education level: Not on file  Occupational History   Occupation: retired    Comment: farming  Tobacco Use   Smoking status: Never   Smokeless tobacco: Never   Tobacco comments:    Quit x 10 years/ never smoked on regular basis  Vaping Use   Vaping Use: Never used  Substance and Sexual Activity   Alcohol use: Yes    Alcohol/week: 2.0 standard drinks    Types: 2 Shots of liquor per week    Comment: drinks on weekends, gin/vodka 1/5th.   Drug use: No   Sexual activity: Never  Other Topics Concern   Not on file  Social History Narrative   HE DOES NOT HAVE ANY CHILDREN.   Lives alone   Does not drive   CAN NOT READ   Social Determinants of Health   Financial Resource Strain: Low Risk    Difficulty of Paying Living Expenses: Not hard at all  Food Insecurity: No Food Insecurity   Worried About Charity fundraiser in the Last Year: Never true   Arboriculturist in the Last Year: Never true  Transportation Needs: No Transportation Needs   Lack of Transportation (Medical):  No   Lack of Transportation (Non-Medical): No  Physical Activity: Insufficiently Active   Days of Exercise per Week: 7 days   Minutes of Exercise per Session: 20 min  Stress: No Stress Concern Present   Feeling of Stress : Not at all  Social Connections: Socially Isolated   Frequency of Communication with Friends and Family: Never   Frequency of Social Gatherings with Friends and Family: Once a week   Attends Religious Services: Never   Marine scientist or Organizations: No   Attends Music therapist: Never   Marital Status: Never married  Human resources officer Violence: Not At Risk   Fear of Current or Ex-Partner: No   Emotionally Abused: No   Physically Abused: No   Sexually Abused: No    FAMILY HISTORY:  Family History  Problem Relation Age of  Onset   Diabetes Mother    Renal Disease Mother        failure   Diabetes Father        'old age'   Hypertension Sister    Hypertension Brother    Diabetes Brother    Diabetes Sister    Diabetes Sister    Diabetes Brother    Hypertension Brother    Diabetes Brother        right BKA   Kidney failure Brother        on dialysis   Kidney failure Brother        on dialysis   Diabetes Brother        bilateral BKA   Colon polyps Neg Hx    Colon cancer Neg Hx    Pancreatic disease Neg Hx     CURRENT MEDICATIONS:  Current Outpatient Medications  Medication Sig Dispense Refill   amLODipine (NORVASC) 5 MG tablet TAKE 1 TABLET BY MOUTH ONCE DAILY. 90 tablet 3   atorvastatin (LIPITOR) 20 MG tablet Take 1 tablet (20 mg total) by mouth daily. 90 tablet 3   clindamycin (CLINDAGEL) 1 % gel Apply 1 application topically daily. Applies to back of neck     colchicine 0.6 MG tablet TAKE 1 TABLET BY MOUTH 3 TIMES DAILY FOR 5 DAYS FOR GOUT PAIN. 15 tablet 4   febuxostat (ULORIC) 40 MG tablet Take 1 tablet (40 mg total) by mouth daily. 90 tablet 5   iron polysaccharides (NIFEREX) 150 MG capsule Take 1 capsule (150 mg total) by  mouth daily. 90 capsule 1   levothyroxine (SYNTHROID, LEVOTHROID) 200 MCG tablet Take 1 tablet (200 mcg total) by mouth daily before breakfast. 90 tablet 3   metoprolol succinate (TOPROL-XL) 25 MG 24 hr tablet Take 1.5 tablets (37.5 mg total) by mouth daily. 45 tablet 6   omeprazole (PRILOSEC) 20 MG capsule TAKE 1 CAPSULE BY MOUTH ONCE DAILY. (Patient taking differently: Take 20 mg by mouth daily.) 30 capsule 3   ondansetron (ZOFRAN) 4 MG tablet Take 4 mg by mouth 3 (three) times daily as needed.     tamsulosin (FLOMAX) 0.4 MG CAPS capsule Take 1 capsule (0.4 mg total) by mouth daily after supper. (Patient taking differently: Take 0.4 mg by mouth daily.) 30 capsule 3   triamcinolone ointment (KENALOG) 0.5 % Apply 1 application topically 2 (two) times daily. 30 g 0   HYDROcodone-acetaminophen (NORCO/VICODIN) 5-325 MG tablet One tablet by mouth every six hours as needed for pain.  Must last 30 days. (Patient not taking: Reported on 02/10/2021) 56 tablet 0   sildenafil (VIAGRA) 100 MG tablet Take 50 mg by mouth daily as needed. (Patient not taking: Reported on 02/10/2021)     No current facility-administered medications for this visit.    ALLERGIES:  Allergies  Allergen Reactions   Aspirin Other (See Comments)    REACTION: Diverticular Bleed    PHYSICAL EXAM:  Performance status (ECOG): 1 - Symptomatic but completely ambulatory  Vitals:   02/10/21 1114 02/10/21 1122  BP:  (!) 175/101  Pulse: 80   Resp: 18   Temp: (!) 96.9 F (36.1 C)   SpO2: 100%    Wt Readings from Last 3 Encounters:  02/10/21 249 lb 11.2 oz (113.3 kg)  12/31/20 243 lb 6 oz (110.4 kg)  10/01/20 231 lb (104.8 kg)   Physical Exam Vitals reviewed.  Constitutional:      Appearance: Normal appearance.  Cardiovascular:     Rate and  Rhythm: Normal rate and regular rhythm.     Pulses: Normal pulses.     Heart sounds: Normal heart sounds.  Pulmonary:     Effort: Pulmonary effort is normal.     Breath sounds: Normal  breath sounds.  Abdominal:     Palpations: Abdomen is soft. There is no hepatomegaly, splenomegaly or mass.     Tenderness: There is no abdominal tenderness.  Musculoskeletal:     Right lower leg: No edema.     Left lower leg: No edema.  Neurological:     General: No focal deficit present.     Mental Status: He is alert and oriented to person, place, and time.  Psychiatric:        Mood and Affect: Mood normal.        Behavior: Behavior normal.     LABORATORY DATA:  I have reviewed the labs as listed.  CBC Latest Ref Rng & Units 02/03/2021 07/24/2020 07/08/2020  WBC 4.0 - 10.5 K/uL 4.1 5.1 4.2  Hemoglobin 13.0 - 17.0 g/dL 13.7 12.7(L) 13.7  Hematocrit 39.0 - 52.0 % 41.0 39.3 41.8  Platelets 150 - 400 K/uL 104(L) 153 94(L)   CMP Latest Ref Rng & Units 02/03/2021 07/24/2020 07/08/2020  Glucose 70 - 99 mg/dL 114(H) 114(H) 108(H)  BUN 8 - 23 mg/dL 16 15 14   Creatinine 0.61 - 1.24 mg/dL 1.44(H) 1.19 1.16  Sodium 135 - 145 mmol/L 138 140 133(L)  Potassium 3.5 - 5.1 mmol/L 4.0 3.9 3.8  Chloride 98 - 111 mmol/L 101 104 95(L)  CO2 22 - 32 mmol/L 28 27 28   Calcium 8.9 - 10.3 mg/dL 9.1 8.7(L) 9.0  Total Protein 6.5 - 8.1 g/dL 8.1 7.2 7.7  Total Bilirubin 0.3 - 1.2 mg/dL 1.3(H) 0.5 0.9  Alkaline Phos 38 - 126 U/L 50 48 49  AST 15 - 41 U/L 56(H) 33 45(H)  ALT 0 - 44 U/L 35 23 29    DIAGNOSTIC IMAGING:  I have independently reviewed the scans and discussed with the patient. No results found.   ASSESSMENT:  1. Well- differentiated neuroendocrine carcinoid tumor: -Polypectomy by Dr. Oneida Alar on 08/14/2014 and 10/08/2015 with negative octreotide scan on 09/19/2014. -His case was presented at GI tumor board in Feb/March 2016 with recommendation for anatomic biopsies to guide role of surgical resection.  Anatomic biopsies in 03/2016 were negative. -His last 2 chromogranin A levels have been elevated January 2020 it was 186.0.  August 2020 it was 180.4.  Dr. Oneida Alar is aware of elevated levels and has  seen the patient in consult and advised EGD.  Patient has refused at this time. -CT abdomen pelvis was negative for disease recurrence. -His last DOT scan was done in February 2020 was negative for any disease.    2.  Pulmonary nodule: -6 mm LUL pulmonary nodule on neck PET scan done on 10/18/2018. -CT chest from September 2020 revealed stable left lung nodule. -Repeat CT chest from 07/11/2020 showed interval development of multiple patchy areas of groundglass attenuation scattered throughout both lungs.  Other nonspecific these are favored to represent sequelae of inflammation or infection.  Atypical viral infection not excluded.  This is likely related to recent COVID-19 diagnosis.  The 7 mm pulmonary nodule remains stable.  Thought to be a benign perifissural lymph node.   3.  Elevated LFTs: -Likely secondary to alcohol abuse. -PET scan did not reveal any acute liver abnormalities.     PLAN:  1. Well- differentiated neuroendocrine carcinoid tumor: - He  does not have any symptoms of carcinoid syndrome. - Reviewed labs from 02/03/2021 which showed chromogranin 131. - RTC 6 months with repeat labs.   2.  Elevated LFTs: - Mildly elevated AST of 56 and total bilirubin 1.3 stable.   3.  Low vitamin D levels: - Vitamin D was 28.22.  Recommend starting vitamin D 1000 units daily.   Orders placed this encounter:  No orders of the defined types were placed in this encounter.    Derek Jack, MD [facility] [phone]   I, Thana Ates, am acting as a scribe for Dr. Derek Jack.  I, Derek Jack MD, have reviewed the above documentation for accuracy and completeness, and I agree with the above.

## 2021-02-10 ENCOUNTER — Other Ambulatory Visit: Payer: Self-pay

## 2021-02-10 ENCOUNTER — Inpatient Hospital Stay (HOSPITAL_BASED_OUTPATIENT_CLINIC_OR_DEPARTMENT_OTHER): Payer: 59 | Admitting: Hematology

## 2021-02-10 VITALS — BP 175/101 | HR 80 | Temp 96.9°F | Resp 18 | Wt 249.7 lb

## 2021-02-10 DIAGNOSIS — L249 Irritant contact dermatitis, unspecified cause: Secondary | ICD-10-CM | POA: Diagnosis not present

## 2021-02-10 DIAGNOSIS — D3A8 Other benign neuroendocrine tumors: Secondary | ICD-10-CM | POA: Diagnosis not present

## 2021-02-10 DIAGNOSIS — C7A092 Malignant carcinoid tumor of the stomach: Secondary | ICD-10-CM | POA: Diagnosis not present

## 2021-02-10 NOTE — Patient Instructions (Addendum)
[name] at [facility] Discharge Instructions  You were seen today by Dr. Delton Coombes. He went over your recent results. Purchase Vitamin D tablets over the counter and take 1000 units daily. Dr. Delton Coombes will see you back in 6 months for labs and follow up.   Thank you for choosing [place] at [facility] to provide your oncology and hematology care.  To afford each patient quality time with our provider, please arrive at least 15 minutes before your scheduled appointment time.   If you have a lab appointment with the [facility] please come in thru the Main Entrance and check in at the main information desk  You need to re-schedule your appointment should you arrive 10 or more minutes late.  We strive to give you quality time with our providers, and arriving late affects you and other patients whose appointments are after yours.  Also, if you no show three or more times for appointments you may be dismissed from the clinic at the providers discretion.     Again, thank you for choosing [facility].  Our hope is that these requests will decrease the amount of time that you wait before being seen by our physicians.       _____________________________________________________________  Should you have questions after your visit to [facility], please contact our office at [phone] between the hours of 8:00 a.m. and 4:30 p.m.  Voicemails left after 4:00 p.m. will not be returned until the following business day.  For prescription refill requests, have your pharmacy contact our office and allow 72 hours.    Cancer Center Support Programs:   > Cancer Support Group  2nd Tuesday of the month 1pm-2pm, Journey Room

## 2021-02-13 ENCOUNTER — Telehealth: Payer: Self-pay | Admitting: Orthopaedic Surgery

## 2021-02-13 MED ORDER — HYDROCODONE-ACETAMINOPHEN 5-325 MG PO TABS: ORAL_TABLET | ORAL | 0 refills | Status: DC

## 2021-02-13 NOTE — Telephone Encounter (Signed)
Patient called request refill on his pain medicine   HYDROcodone-acetaminophen (NORCO/VICODIN) 5-325 MG tablet  Pharmacy:  [facility]

## 2021-03-10 ENCOUNTER — Other Ambulatory Visit: Payer: Self-pay | Admitting: Orthopaedic Surgery

## 2021-03-13 ENCOUNTER — Telehealth: Payer: Self-pay | Admitting: Orthopaedic Surgery

## 2021-03-13 NOTE — Telephone Encounter (Signed)
Patient called request refill on his pain medicine    HYDROcodone-acetaminophen (NORCO/VICODIN) 5-325 MG tablet   Pharmacy:  [facility]

## 2021-03-17 MED ORDER — HYDROCODONE-ACETAMINOPHEN 5-325 MG PO TABS: ORAL_TABLET | ORAL | 0 refills | Status: DC

## 2021-04-01 ENCOUNTER — Other Ambulatory Visit: Payer: Self-pay

## 2021-04-01 ENCOUNTER — Encounter: Payer: Self-pay | Admitting: Orthopaedic Surgery

## 2021-04-01 ENCOUNTER — Ambulatory Visit (INDEPENDENT_AMBULATORY_CARE_PROVIDER_SITE_OTHER): Payer: Medicare Other | Admitting: Orthopaedic Surgery

## 2021-04-01 VITALS — BP 162/111 | HR 87 | Ht 75.0 in | Wt 256.2 lb

## 2021-04-01 DIAGNOSIS — G894 Chronic pain syndrome: Secondary | ICD-10-CM

## 2021-04-01 DIAGNOSIS — M1A0410 Idiopathic chronic gout, right hand, without tophus (tophi): Secondary | ICD-10-CM | POA: Diagnosis not present

## 2021-04-01 NOTE — Progress Notes (Signed)
My gout is better.  He has been taking his gout medicine and has not had a flare up in several months.  He has pain at times.  He has chronic swelling of the right dorsal radial wrist.  He has no new trauma.  He has swelling of dorsal radial right wrist.  He has no swelling of the fingers.  He has normal NV status. Grips are good.  Encounter Diagnoses  Name Primary?   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  Call if worse.  Call if any problem.  Precautions discussed.  Electronically Signed Sanjuana Kava, MD 8/2/20229:17 AM

## 2021-04-15 ENCOUNTER — Telehealth: Payer: Self-pay | Admitting: Orthopaedic Surgery

## 2021-04-15 MED ORDER — HYDROCODONE-ACETAMINOPHEN 5-325 MG PO TABS: ORAL_TABLET | ORAL | 0 refills | Status: DC

## 2021-04-15 NOTE — Telephone Encounter (Signed)
Patient requests refill: HYDROcodone-acetaminophen (NORCO/VICODIN) 5-325 MG tablet 54 tablet   Pharmacy: [facility]

## 2021-05-15 ENCOUNTER — Telehealth: Payer: Self-pay | Admitting: Orthopaedic Surgery

## 2021-05-15 MED ORDER — HYDROCODONE-ACETAMINOPHEN 5-325 MG PO TABS: ORAL_TABLET | ORAL | 0 refills | Status: DC

## 2021-05-15 NOTE — Telephone Encounter (Signed)
Patient requests refill: HYDROcodone-acetaminophen (NORCO/VICODIN) 5-325 MG tablet 54 tablet  Pharmacy:  [facility]

## 2021-06-05 ENCOUNTER — Emergency Department (HOSPITAL_COMMUNITY)
Admission: EM | Admit: 2021-06-05 | Discharge: 2021-06-05 | Disposition: A | Discharge status: Home / Self Care | Payer: Medicare Other | Attending: Emergency Medicine | Admitting: Emergency Medicine

## 2021-06-05 ENCOUNTER — Other Ambulatory Visit: Payer: Self-pay

## 2021-06-05 ENCOUNTER — Emergency Department (HOSPITAL_COMMUNITY): Payer: Medicare Other

## 2021-06-05 ENCOUNTER — Encounter (HOSPITAL_COMMUNITY): Payer: Self-pay | Admitting: Emergency Medicine

## 2021-06-05 DIAGNOSIS — Z96653 Presence of artificial knee joint, bilateral: Secondary | ICD-10-CM | POA: Insufficient documentation

## 2021-06-05 DIAGNOSIS — Z79899 Other long term (current) drug therapy: Secondary | ICD-10-CM | POA: Insufficient documentation

## 2021-06-05 DIAGNOSIS — U071 COVID-19: Secondary | ICD-10-CM | POA: Insufficient documentation

## 2021-06-05 DIAGNOSIS — I10 Essential (primary) hypertension: Secondary | ICD-10-CM | POA: Insufficient documentation

## 2021-06-05 DIAGNOSIS — R0981 Nasal congestion: Secondary | ICD-10-CM | POA: Diagnosis not present

## 2021-06-05 DIAGNOSIS — Z85038 Personal history of other malignant neoplasm of large intestine: Secondary | ICD-10-CM | POA: Diagnosis not present

## 2021-06-05 DIAGNOSIS — E039 Hypothyroidism, unspecified: Secondary | ICD-10-CM | POA: Diagnosis not present

## 2021-06-05 DIAGNOSIS — R059 Cough, unspecified: Secondary | ICD-10-CM | POA: Diagnosis present

## 2021-06-05 LAB — RESP PANEL BY RT-PCR (FLU A&B, COVID) ARPGX2
Influenza A by PCR: NEGATIVE
Influenza B by PCR: NEGATIVE
SARS Coronavirus 2 by RT PCR: POSITIVE — AB

## 2021-06-05 MED ORDER — NIRMATRELVIR/RITONAVIR (PAXLOVID) TABLET (RENAL DOSING): 2.0000 | ORAL_TABLET | Freq: Two times a day (BID) | ORAL | 0 refills | Status: AC

## 2021-06-05 NOTE — ED Provider Notes (Signed)
[facility] EMERGENCY DEPARTMENT Provider Note   CSN: [id number] Arrival date & time: 06/05/21  0422     History Chief Complaint  Patient presents with   Cough    [name] is a 70 y.o. male.  Patient is a 70 year old male with history of GERD, hypertension, diverticulosis.  Patient presenting today for evaluation of congestion and cough for the past few days.  He reports fatigue.  He denies any nausea vomiting, or diarrhea.  He denies any ill contacts.  The history is provided by the patient.  Cough Cough characteristics:  Productive Sputum characteristics:  Yellow Severity:  Moderate Onset quality:  Gradual Duration:  3 days Timing:  Constant Progression:  Worsening Chronicity:  New     Past Medical History:  Diagnosis Date   Anemia    FeDA: GASTRIC POLYPS, B12; TCS 2008 EGD 2009, 2008-HB 11.1 MCV 83.6 CR 1.22, 2009 FERRITIN 102-22   B12 deficiency    Carcinoid tumor determined by biopsy of stomach 09/25/2014   Chronic knee pain    Diverticulosis of colon    Lower GI bleed 2008   Essential hypertension    GERD (gastroesophageal reflux disease)    Gout    Hepatomegaly    Hepatic steatosis   History of alcohol abuse    History of septic arthritis    Hypothyroidism    PUD    Substance abuse ([place])     Patient Active Problem List   Diagnosis Date Noted   Colon polyps 12/28/2018   UTI (urinary tract infection) 09/22/2018   AKI (acute kidney injury) ([place]) 09/22/2018   Left inguinal hernia 09/20/2018   S/P total knee replacement, left 06/29/06 01/27/2018   Hyperlipidemia 10/23/2016   Mild intellectual disabilities (CODE) 09/29/2016   Personal history of gout 09/22/2016   Literacy level of illiterate 09/22/2016   S/P TKR (total knee replacement), right 09/18/08 09/22/2016   Carcinoid tumor determined by biopsy of stomach 04/10/2016   Tubulovillous adenoma of colon 09/12/2015   Neuroendocrine tumor 09/25/2014   Elevated LFTs 07/17/2014   Iron deficiency  anemia 07/29/2010   Fatty liver 07/29/2010   Alcohol abuse 11/30/2008   Osteoarthrosis involving lower leg 08/20/2008   Essential hypertension 10/20/2006   B12 deficiency 08/15/2006   GERD 08/15/2006   IBS 08/15/2006   BPH without obstruction/lower urinary tract symptoms 08/15/2006   OSTEOARTHRITIS 08/15/2006   DEGENERATION, [place] NOS 08/15/2006    Past Surgical History:  Procedure Laterality Date   BIOPSY  08/14/2014   Procedure: BIOPSY;  Surgeon: Danie Binder, MD;  Location: [facility];  Service: Endoscopy;;   BIOPSY  03/12/2015   Procedure: BIOPSY;  Surgeon: Danie Binder, MD;  Location: [facility];  Service: Endoscopy;;   BIOPSY  04/21/2016   Procedure: BIOPSY;  Surgeon: Danie Binder, MD;  Location: [facility];  Service: Endoscopy;;  bx's of antrum, body of stomach, fundus, and cardia   CHOLECYSTECTOMY     COLONOSCOPY  2008 [facility] DJ   LGIB 2o to TICS, prep good   COLONOSCOPY WITH PROPOFOL N/A 08/14/2014   SLF: 1. Four large colon polyps removed. 2. The left colon is redundant 3. Moderate diverticulosis throughout teh entire examined colon   COLONOSCOPY WITH PROPOFOL N/A 11/06/2014   SLF: 8 small polyps removed. One large pedunculated polyp removed from the ascending colon, tubulovillous and tubular adenomas. Next colonoscopy March 2019   COLONOSCOPY WITH PROPOFOL N/A 01/04/2018   Procedure: COLONOSCOPY WITH PROPOFOL;  Surgeon: Danie Binder, MD;  [facility]: [facility];  Service: Endoscopy;  Laterality: N/A;  10:00am   ESOPHAGOGASTRODUODENOSCOPY  12/08/2007   XVQ:MGQQ gastric polyps seen in the cardia and body of the stomach/Normal esophagus without evidence of Barrett's, mass, erosion/Normal duodenal bulb and second portion of the duodenum. Benign bx.   ESOPHAGOGASTRODUODENOSCOPY (EGD) WITH PROPOFOL N/A 08/14/2014   SLF: 1. Heme postive stools due to gastric and colon polyps 2. Multiple gastric    ESOPHAGOGASTRODUODENOSCOPY (EGD) WITH PROPOFOL N/A 03/12/2015   SLF: Multiple gastric  nodules seen in gasric body/antrum. 2. Non-erosive gastritis ( inflammation) was found in the gastric antrum.    ESOPHAGOGASTRODUODENOSCOPY (EGD) WITH PROPOFOL N/A 10/08/2015   Procedure: ESOPHAGOGASTRODUODENOSCOPY (EGD) WITH PROPOFOL;  Surgeon: Danie Binder, MD;  [facility];  Service: Endoscopy;  Laterality: N/A;  0945   ESOPHAGOGASTRODUODENOSCOPY (EGD) WITH PROPOFOL N/A 04/21/2016   Procedure: ESOPHAGOGASTRODUODENOSCOPY (EGD) WITH PROPOFOL;  Surgeon: Danie Binder, MD;  [facility];  Service: Endoscopy;  Laterality: N/A;  730    HAND SURGERY Right    fracture repair with plates   INGUINAL HERNIA REPAIR Left 11/09/2018   Procedure: HERNIA REPAIR INGUINAL ADULT WITH MESH;  Surgeon: Virl Cagey, MD;  Location: [facility];  Service: General;  Laterality: Left;   JOINT REPLACEMENT Bilateral    knees   POLYPECTOMY  08/14/2014   Procedure: POLYPECTOMY;  Surgeon: Danie Binder, MD;  Location: [facility];  Service: Endoscopy;;   POLYPECTOMY N/A 11/06/2014   Procedure: POLYPECTOMY;  Surgeon: Danie Binder, MD;  Location: [facility];  Service: Endoscopy;  Laterality: N/A;  Transverse Colon x3, Ascending Colon x2, Descending Colon x3   REPLACEMENT TOTAL KNEE BILATERAL     UPPER GASTROINTESTINAL ENDOSCOPY  APR 2009   INFLAMED HYPERPLASTIC POLYPS, CHRONIC GASTRITIS       Family History  Problem Relation Age of Onset   Diabetes Mother    Renal Disease Mother        failure   Diabetes Father        'old age'   Hypertension Sister    Hypertension Brother    Diabetes Brother    Diabetes Sister    Diabetes Sister    Diabetes Brother    Hypertension Brother    Diabetes Brother        right BKA   Kidney failure Brother        on dialysis   Kidney failure Brother        on dialysis   Diabetes Brother        bilateral BKA   Colon polyps Neg Hx    Colon cancer Neg Hx    Pancreatic disease Neg Hx     Social History   Tobacco Use   Smoking status: Never   Smokeless  tobacco: Never   Tobacco comments:    Quit x 10 years/ never smoked on regular basis  Vaping Use   Vaping Use: Never used  Substance Use Topics   Alcohol use: Yes    Alcohol/week: 2.0 standard drinks    Types: 2 Shots of liquor per week    Comment: drinks on weekends, gin/vodka 1/5th.   Drug use: No    Home Medications Prior to Admission medications   Medication Sig Start Date End Date Taking? Authorizing Provider  amLODipine (NORVASC) 5 MG tablet TAKE 1 TABLET BY MOUTH ONCE DAILY. 03/09/19   [name], MD  atorvastatin (LIPITOR) 20 MG tablet Take 1 tablet (20 mg total) by mouth  daily. 10/23/16   Raylene Everts, MD  clindamycin (CLINDAGEL) 1 % gel Apply 1 application topically daily. Applies to back of neck    [provider]  colchicine 0.6 MG tablet TAKE 1 TABLET BY MOUTH 3 TIMES DAILY FOR 5 DAYS FOR GOUT PAIN. 03/10/21   Sanjuana Kava, MD  febuxostat (ULORIC) 40 MG tablet Take 1 tablet (40 mg total) by mouth daily. 12/31/20   Sanjuana Kava, MD  HYDROcodone-acetaminophen (NORCO/VICODIN) 5-325 MG tablet One tablet by mouth every six hours as needed for pain.  Must last 30 days. 05/15/21   Sanjuana Kava, MD  iron polysaccharides (NIFEREX) 150 MG capsule Take 1 capsule (150 mg total) by mouth daily. 05/20/20   Derek Jack, MD  levothyroxine (SYNTHROID) 25 MCG tablet Take 25 mcg by mouth every morning. 03/05/21   [provider]  levothyroxine (SYNTHROID, LEVOTHROID) 200 MCG tablet Take 1 tablet (200 mcg total) by mouth daily before breakfast. 09/24/18   Roxan Hockey, MD  metoprolol succinate (TOPROL-XL) 25 MG 24 hr tablet Take 1.5 tablets (37.5 mg total) by mouth daily. 02/19/20   [name], MD  omeprazole (PRILOSEC) 20 MG capsule TAKE 1 CAPSULE BY MOUTH ONCE DAILY. Patient taking differently: Take 20 mg by mouth daily. 11/19/17   Carlis Stable, NP  ondansetron (ZOFRAN) 4 MG tablet Take 4 mg by mouth 3 (three) times daily as needed. 07/11/20    [provider]  sildenafil (VIAGRA) 100 MG tablet Take 50 mg by mouth daily as needed. 09/28/19   [provider]  tamsulosin (FLOMAX) 0.4 MG CAPS capsule Take 1 capsule (0.4 mg total) by mouth daily after supper. Patient taking differently: Take 0.4 mg by mouth daily. 09/24/18   Roxan Hockey, MD  triamcinolone ointment (KENALOG) 0.5 % Apply 1 application topically 2 (two) times daily. 07/30/20   Jacquelin Hawking, NP  Vitamin D, Cholecalciferol, 25 MCG (1000 UT) TABS Take 1 tablet by mouth daily. 02/10/21   Derek Jack, MD    Allergies    Aspirin  Review of Systems   Review of Systems  Respiratory:  Positive for cough.   All other systems reviewed and are negative.  Physical Exam Updated Vital Signs BP (!) 148/90   Pulse 99   Temp 98.8 F (37.1 C) (Oral)   Resp 18   Ht 6\' 3"  (1.905 m)   Wt 110.7 kg   SpO2 98%   BMI 30.50 kg/m   Physical Exam Vitals and nursing note reviewed.  Constitutional:      General: He is not in acute distress.    Appearance: He is well-developed. He is not diaphoretic.  HENT:     Head: Normocephalic and atraumatic.  Cardiovascular:     Rate and Rhythm: Normal rate and regular rhythm.     Heart sounds: No murmur heard.   No friction rub.  Pulmonary:     Effort: Pulmonary effort is normal. No respiratory distress.     Breath sounds: Normal breath sounds. No wheezing or rales.  Abdominal:     General: Bowel sounds are normal. There is no distension.     Palpations: Abdomen is soft.     Tenderness: There is no abdominal tenderness.  Musculoskeletal:        General: Normal range of motion.     Cervical back: Normal range of motion and neck supple.  Skin:    General: Skin is warm and dry.  Neurological:     Mental Status: He is  alert and oriented to person, place, and time.     Coordination: Coordination normal.    ED Results / Procedures / Treatments   Labs (all labs ordered are listed, but only abnormal  results are displayed) Labs Reviewed - No data to display  EKG None  Radiology No results found.  Procedures Procedures   Medications Ordered in ED Medications - No data to display  ED Course  I have reviewed the triage vital signs and the nursing notes.  Pertinent labs & imaging results that were available during my care of the patient were reviewed by me and considered in my medical decision making (see chart for details).    MDM Rules/Calculators/A&P  Patient presenting here with URI symptoms.  Vitals are stable and no hypoxia.  Chest x-ray shows no acute process, but COVID test is positive.  Patient appears clinically well.  He was offered Paxlovid, but is uncertain as to whether or not he wants to take it.  I will prescribe this medication and should he so desire can get the prescription filled.  To continue over-the-counter medications as needed and return if he worsens.  [name] was evaluated in Emergency Department on 06/05/2021 for the symptoms described in the history of present illness. He was evaluated in the context of the global COVID-19 pandemic, which necessitated consideration that the patient might be at risk for infection with the SARS-CoV-2 virus that causes COVID-19. Institutional protocols and algorithms that pertain to the evaluation of patients at risk for COVID-19 are in a state of rapid change based on information released by regulatory bodies including the CDC and federal and state organizations. These policies and algorithms were followed during the patient's care in the ED.     Final Clinical Impression(s) / ED Diagnoses Final diagnoses:  None    Rx / DC Orders ED Discharge Orders     None        Veryl Speak, MD 06/05/21 [phone]

## 2021-06-05 NOTE — ED Triage Notes (Signed)
Pt c/o cough and fatigue for the past few days. Pt states "I think I have a cold."

## 2021-06-05 NOTE — Discharge Instructions (Addendum)
Take over-the-counter medications as needed for relief of symptoms.  If so desired, fill the prescription for Paxlovid as this is the current treatment for COVID-19.  Isolate at home for the next 5 days.  Return to the emergency department if you develop severe chest pain, difficulty breathing, or other new and concerning symptoms.

## 2021-06-16 ENCOUNTER — Telehealth: Payer: Self-pay | Admitting: Orthopaedic Surgery

## 2021-06-16 MED ORDER — HYDROCODONE-ACETAMINOPHEN 5-325 MG PO TABS: ORAL_TABLET | ORAL | 0 refills | Status: DC

## 2021-06-16 NOTE — Telephone Encounter (Signed)
Patient called for refill: °HYDROcodone-acetaminophen (NORCO/VICODIN) 5-325 MG tablet 54 tablet  °     [facility] °

## 2021-06-19 DIAGNOSIS — E559 Vitamin D deficiency, unspecified: Secondary | ICD-10-CM | POA: Insufficient documentation

## 2021-07-03 ENCOUNTER — Ambulatory Visit (INDEPENDENT_AMBULATORY_CARE_PROVIDER_SITE_OTHER): Payer: Medicare Other | Admitting: Orthopaedic Surgery

## 2021-07-03 ENCOUNTER — Encounter: Payer: Self-pay | Admitting: Orthopaedic Surgery

## 2021-07-03 ENCOUNTER — Other Ambulatory Visit: Payer: Self-pay

## 2021-07-03 VITALS — BP 201/118 | HR 92 | Ht 75.0 in | Wt 239.8 lb

## 2021-07-03 DIAGNOSIS — G894 Chronic pain syndrome: Secondary | ICD-10-CM

## 2021-07-03 DIAGNOSIS — M1A0410 Idiopathic chronic gout, right hand, without tophus (tophi): Secondary | ICD-10-CM | POA: Diagnosis not present

## 2021-07-03 NOTE — Progress Notes (Signed)
My gout is better  He is taking his gout medicine.  He has not had a flare up recently.  He still has swelling of the right wrist with small tophus present.    He has good ROM of the right wrist.  He has small radial dorsal tophus.  NV intact.  Encounter Diagnoses  Name Primary?   Idiopathic chronic gout of right hand without tophus Yes   Chronic pain disorder    Continue his gout medicine.  Return in three months.  Call if any problem.  Precautions discussed.  Electronically Signed Sanjuana Kava, MD 11/3/20229:38 AM

## 2021-07-09 ENCOUNTER — Telehealth: Payer: Self-pay | Admitting: Orthopaedic Surgery

## 2021-07-16 ENCOUNTER — Telehealth: Payer: Self-pay

## 2021-07-16 NOTE — Telephone Encounter (Signed)
Hydrocodone-Acetaminophen 5/325 mg  Qty 54 Tablets  PATIENT USES [facility]

## 2021-07-17 MED ORDER — HYDROCODONE-ACETAMINOPHEN 5-325 MG PO TABS: ORAL_TABLET | ORAL | 0 refills | Status: DC

## 2021-08-12 ENCOUNTER — Inpatient Hospital Stay (HOSPITAL_COMMUNITY): Payer: Medicare Other | Attending: Hematology

## 2021-08-12 DIAGNOSIS — R7989 Other specified abnormal findings of blood chemistry: Secondary | ICD-10-CM | POA: Insufficient documentation

## 2021-08-12 DIAGNOSIS — E538 Deficiency of other specified B group vitamins: Secondary | ICD-10-CM | POA: Insufficient documentation

## 2021-08-12 DIAGNOSIS — C7A092 Malignant carcinoid tumor of the stomach: Secondary | ICD-10-CM | POA: Insufficient documentation

## 2021-08-12 DIAGNOSIS — E559 Vitamin D deficiency, unspecified: Secondary | ICD-10-CM | POA: Insufficient documentation

## 2021-08-14 ENCOUNTER — Telehealth: Payer: Self-pay | Admitting: Orthopaedic Surgery

## 2021-08-14 ENCOUNTER — Other Ambulatory Visit (HOSPITAL_COMMUNITY): Payer: Self-pay | Admitting: *Deleted

## 2021-08-14 DIAGNOSIS — D3A8 Other benign neuroendocrine tumors: Secondary | ICD-10-CM

## 2021-08-14 MED ORDER — HYDROCODONE-ACETAMINOPHEN 5-325 MG PO TABS: ORAL_TABLET | ORAL | 0 refills | Status: DC

## 2021-08-14 NOTE — Telephone Encounter (Signed)
Patient called for refill: HYDROcodone-acetaminophen (NORCO/VICODIN) 5-325 MG tablet 54 tablet  Pharmacy: [facility]  

## 2021-08-15 ENCOUNTER — Inpatient Hospital Stay (HOSPITAL_COMMUNITY): Payer: Medicare Other

## 2021-08-15 DIAGNOSIS — R7989 Other specified abnormal findings of blood chemistry: Secondary | ICD-10-CM | POA: Diagnosis not present

## 2021-08-15 DIAGNOSIS — E538 Deficiency of other specified B group vitamins: Secondary | ICD-10-CM | POA: Diagnosis present

## 2021-08-15 DIAGNOSIS — D3A8 Other benign neuroendocrine tumors: Secondary | ICD-10-CM

## 2021-08-15 DIAGNOSIS — E559 Vitamin D deficiency, unspecified: Secondary | ICD-10-CM | POA: Diagnosis not present

## 2021-08-15 DIAGNOSIS — C7A092 Malignant carcinoid tumor of the stomach: Secondary | ICD-10-CM | POA: Diagnosis not present

## 2021-08-15 LAB — CBC WITH DIFFERENTIAL/PLATELET
Abs Immature Granulocytes: 0.01 10*3/uL (ref 0.00–0.07)
Basophils Absolute: 0 10*3/uL (ref 0.0–0.1)
Basophils Relative: 0 %
Eosinophils Absolute: 0.2 10*3/uL (ref 0.0–0.5)
Eosinophils Relative: 4 %
HCT: 34.9 % — ABNORMAL LOW (ref 39.0–52.0)
Hemoglobin: 11.8 g/dL — ABNORMAL LOW (ref 13.0–17.0)
Immature Granulocytes: 0 %
Lymphocytes Relative: 40 %
Lymphs Abs: 2.1 10*3/uL (ref 0.7–4.0)
MCH: 36.8 pg — ABNORMAL HIGH (ref 26.0–34.0)
MCHC: 33.8 g/dL (ref 30.0–36.0)
MCV: 108.7 fL — ABNORMAL HIGH (ref 80.0–100.0)
Monocytes Absolute: 0.6 10*3/uL (ref 0.1–1.0)
Monocytes Relative: 11 %
Neutro Abs: 2.4 10*3/uL (ref 1.7–7.7)
Neutrophils Relative %: 45 %
Platelets: 122 10*3/uL — ABNORMAL LOW (ref 150–400)
RBC: 3.21 MIL/uL — ABNORMAL LOW (ref 4.22–5.81)
RDW: 15.6 % — ABNORMAL HIGH (ref 11.5–15.5)
WBC: 5.3 10*3/uL (ref 4.0–10.5)
nRBC: 0 % (ref 0.0–0.2)

## 2021-08-15 LAB — COMPREHENSIVE METABOLIC PANEL
ALT: 24 U/L (ref 0–44)
AST: 42 U/L — ABNORMAL HIGH (ref 15–41)
Albumin: 3.6 g/dL (ref 3.5–5.0)
Alkaline Phosphatase: 52 U/L (ref 38–126)
Anion gap: 8 (ref 5–15)
BUN: 12 mg/dL (ref 8–23)
CO2: 27 mmol/L (ref 22–32)
Calcium: 8.9 mg/dL (ref 8.9–10.3)
Chloride: 101 mmol/L (ref 98–111)
Creatinine, Ser: 1.15 mg/dL (ref 0.61–1.24)
GFR, Estimated: >60 mL/min (ref >60)
Glucose, Bld: 103 mg/dL — ABNORMAL HIGH (ref 70–99)
Potassium: 3.4 mmol/L — ABNORMAL LOW (ref 3.5–5.1)
Sodium: 136 mmol/L (ref 135–145)
Total Bilirubin: 1.2 mg/dL (ref 0.3–1.2)
Total Protein: 7.4 g/dL (ref 6.5–8.1)

## 2021-08-15 LAB — LACTATE DEHYDROGENASE: LDH: 141 U/L (ref 98–192)

## 2021-08-15 LAB — VITAMIN D 25 HYDROXY (VIT D DEFICIENCY, FRACTURES): Vit D, 25-Hydroxy: 49.28 ng/mL (ref 30–100)

## 2021-08-15 LAB — VITAMIN B12: Vitamin B-12: 240 pg/mL (ref 180–914)

## 2021-08-18 LAB — CHROMOGRANIN A: Chromogranin A (ng/mL): 151.2 ng/mL — ABNORMAL HIGH (ref 0.0–101.8)

## 2021-08-18 NOTE — Progress Notes (Signed)
[facility] [place], [place] [address]   CLINIC:  Medical Oncology/Hematology  PCP:  Denyce Robert, [place] / [place] [address] [phone]   REASON FOR VISIT:  Follow-up for neuroendocrine tumor  CURRENT THERAPY: surveillance  BRIEF ONCOLOGIC HISTORY:  Oncology History   No history exists.    CANCER STAGING:  Cancer Staging  No matching staging information was found for the patient.  INTERVAL HISTORY:  [name], a 70 y.o. male, returns for routine follow-up of his neuroendocrine tumor. Jahzier was last seen on 02/10/2021.   Today he reports feeling good. He denies diarrhea, cramping, wheezing, and flushing. His appetite is good. He is no longer taking vitamin  B-12 tablet or receiving injections.   REVIEW OF SYSTEMS:  Review of Systems  Constitutional:  Negative for appetite change and fatigue.  Respiratory:  Negative for wheezing.   Gastrointestinal:  Negative for abdominal pain and diarrhea.  All other systems reviewed and are negative.  PAST MEDICAL/SURGICAL HISTORY:  Past Medical History:  Diagnosis Date   Anemia    FeDA: GASTRIC POLYPS, B12; TCS 2008 EGD 2009, 2008-HB 11.1 MCV 83.6 CR 1.22, 2009 FERRITIN 102-22   B12 deficiency    Carcinoid tumor determined by biopsy of stomach 09/25/2014   Chronic knee pain    Diverticulosis of colon    Lower GI bleed 2008   Essential hypertension    GERD (gastroesophageal reflux disease)    Gout    Hepatomegaly    Hepatic steatosis   History of alcohol abuse    History of septic arthritis    Hypothyroidism    PUD    Substance abuse ([place])    Past Surgical History:  Procedure Laterality Date   BIOPSY  08/14/2014   Procedure: BIOPSY;  Surgeon: Danie Binder, MD;  Location: [facility];  Service: Endoscopy;;   BIOPSY  03/12/2015   Procedure: BIOPSY;  Surgeon: Danie Binder, MD;  Location: [facility];  Service: Endoscopy;;   BIOPSY  04/21/2016   Procedure:  BIOPSY;  Surgeon: Danie Binder, MD;  Location: [facility];  Service: Endoscopy;;  bx's of antrum, body of stomach, fundus, and cardia   CHOLECYSTECTOMY     COLONOSCOPY  2008 [facility] DJ   LGIB 2o to TICS, prep good   COLONOSCOPY WITH PROPOFOL N/A 08/14/2014   SLF: 1. Four large colon polyps removed. 2. The left colon is redundant 3. Moderate diverticulosis throughout teh entire examined colon   COLONOSCOPY WITH PROPOFOL N/A 11/06/2014   SLF: 8 small polyps removed. One large pedunculated polyp removed from the ascending colon, tubulovillous and tubular adenomas. Next colonoscopy March 2019   COLONOSCOPY WITH PROPOFOL N/A 01/04/2018   Procedure: COLONOSCOPY WITH PROPOFOL;  Surgeon: Danie Binder, MD;  [facility]: [facility];  Service: Endoscopy;  Laterality: N/A;  10:00am   ESOPHAGOGASTRODUODENOSCOPY  12/08/2007   CHY:IFOY gastric polyps seen in the cardia and body of the stomach/Normal esophagus without evidence of Barrett's, mass, erosion/Normal duodenal bulb and second portion of the duodenum. Benign bx.   ESOPHAGOGASTRODUODENOSCOPY (EGD) WITH PROPOFOL N/A 08/14/2014   SLF: 1. Heme postive stools due to gastric and colon polyps 2. Multiple gastric    ESOPHAGOGASTRODUODENOSCOPY (EGD) WITH PROPOFOL N/A 03/12/2015   SLF: Multiple gastric nodules seen in gasric body/antrum. 2. Non-erosive gastritis ( inflammation) was found in the gastric antrum.    ESOPHAGOGASTRODUODENOSCOPY (EGD) WITH PROPOFOL N/A 10/08/2015   Procedure: ESOPHAGOGASTRODUODENOSCOPY (EGD) WITH  PROPOFOL;  Surgeon: Danie Binder, MD;  [facility];  Service: Endoscopy;  Laterality: N/A;  0945   ESOPHAGOGASTRODUODENOSCOPY (EGD) WITH PROPOFOL N/A 04/21/2016   Procedure: ESOPHAGOGASTRODUODENOSCOPY (EGD) WITH PROPOFOL;  Surgeon: Danie Binder, MD;  [facility];  Service: Endoscopy;  Laterality: N/A;  730    HAND SURGERY Right    fracture repair with plates   INGUINAL HERNIA REPAIR Left 11/09/2018   Procedure:  HERNIA REPAIR INGUINAL ADULT WITH MESH;  Surgeon: Virl Cagey, MD;  Location: [facility];  Service: General;  Laterality: Left;   JOINT REPLACEMENT Bilateral    knees   POLYPECTOMY  08/14/2014   Procedure: POLYPECTOMY;  Surgeon: Danie Binder, MD;  Location: [facility];  Service: Endoscopy;;   POLYPECTOMY N/A 11/06/2014   Procedure: POLYPECTOMY;  Surgeon: Danie Binder, MD;  Location: [facility];  Service: Endoscopy;  Laterality: N/A;  Transverse Colon x3, Ascending Colon x2, Descending Colon x3   REPLACEMENT TOTAL KNEE BILATERAL     UPPER GASTROINTESTINAL ENDOSCOPY  AP[facility] 2009   INFLAMED HYPERPLASTIC POLYPS, CHRONIC GASTRITIS    SOCIAL HISTORY:  Social History   Socioeconomic History   Marital status: Single    Spouse name: Not on file   Number of children: 0   Years of education: 3   Highest education level: Not on file  Occupational History   Occupation: retired    Comment: farming  Tobacco Use   Smoking status: Never   Smokeless tobacco: Never   Tobacco comments:    Quit x 10 years/ never smoked on regular basis  Vaping Use   Vaping Use: Never used  Substance and Sexual Activity   Alcohol use: Yes    Alcohol/week: 2.0 standard drinks    Types: 2 Shots of liquor per week    Comment: drinks on weekends, gin/vodka 1/5th.   Drug use: No   Sexual activity: Never  Other Topics Concern   Not on file  Social History Narrative   HE DOES NOT HAVE ANY CHILDREN.   Lives alone   Does not drive   CAN NOT READ   Social Determinants of Health   Financial Resource Strain: Not on file  Food Insecurity: Not on file  Transportation Needs: Not on file  Physical Activity: Not on file  Stress: Not on file  Social Connections: Not on file  Intimate Partner Violence: Not on file    FAMILY HISTORY:  Family History  Problem Relation Age of Onset   Diabetes Mother    Renal Disease Mother        failure   Diabetes Father        'old age'   Hypertension Sister    Hypertension Brother     Diabetes Brother    Diabetes Sister    Diabetes Sister    Diabetes Brother    Hypertension Brother    Diabetes Brother        right BKA   Kidney failure Brother        on dialysis   Kidney failure Brother        on dialysis   Diabetes Brother        bilateral BKA   Colon polyps Neg Hx    Colon cancer Neg Hx    Pancreatic disease Neg Hx     CURRENT MEDICATIONS:  Current Outpatient Medications  Medication Sig Dispense Refill   amLODipine (NORVASC) 5 MG tablet TAKE 1 TABLET BY MOUTH ONCE DAILY. [place]  tablet 3   atorvastatin (LIPITOR) 20 MG tablet Take 1 tablet (20 mg total) by mouth daily. 90 tablet 3   clindamycin (CLINDAGEL) 1 % gel Apply 1 application topically daily. Applies to back of neck     colchicine 0.6 MG tablet TAKE 1 TABLET BY MOUTH 3 TIMES DAILY FOR 5 DAYS FOR GOUT PAIN. 15 tablet 5   febuxostat (ULORIC) 40 MG tablet Take 1 tablet (40 mg total) by mouth daily. 90 tablet 5   HYDROcodone-acetaminophen (NORCO/VICODIN) 5-325 MG tablet One tablet by mouth every six hours as needed for pain.  Must last 30 days. 54 tablet 0   iron polysaccharides (NIFEREX) 150 MG capsule Take 1 capsule (150 mg total) by mouth daily. 90 capsule 1   levothyroxine (SYNTHROID) 25 MCG tablet Take 25 mcg by mouth every morning.     levothyroxine (SYNTHROID, LEVOTHROID) 200 MCG tablet Take 1 tablet (200 mcg total) by mouth daily before breakfast. 90 tablet 3   metoprolol succinate (TOPROL-XL) 25 MG 24 hr tablet Take 1.5 tablets (37.5 mg total) by mouth daily. 45 tablet 6   omeprazole (PRILOSEC) 20 MG capsule TAKE 1 CAPSULE BY MOUTH ONCE DAILY. (Patient taking differently: Take 20 mg by mouth daily.) 30 capsule 3   sildenafil (VIAGRA) 100 MG tablet Take 50 mg by mouth daily as needed.     tamsulosin (FLOMAX) 0.4 MG CAPS capsule Take 1 capsule (0.4 mg total) by mouth daily after supper. (Patient taking differently: Take 0.4 mg by mouth daily.) 30 capsule 3   triamcinolone ointment (KENALOG) 0.5 % Apply  1 application topically 2 (two) times daily. 30 g 0   Vitamin D, Cholecalciferol, 25 MCG (1000 UT) TABS Take 1 tablet by mouth daily.     ondansetron (ZOFRAN) 4 MG tablet Take 4 mg by mouth 3 (three) times daily as needed. (Patient not taking: Reported on 08/19/2021)     No current facility-administered medications for this visit.    ALLERGIES:  Allergies  Allergen Reactions   Aspirin Other (See Comments)    REACTION: Diverticular Bleed    PHYSICAL EXAM:  Performance status (ECOG): 1 - Symptomatic but completely ambulatory  Vitals:   08/19/21 0801  BP: (!) 171/99  Pulse: 83  Resp: 18  Temp: (!) 97.5 F (36.4 C)  SpO2: 95%   Wt Readings from Last 3 Encounters:  08/19/21 240 lb 8.4 oz (109.1 kg)  07/03/21 239 lb 12.8 oz (108.8 kg)  06/05/21 244 lb (110.7 kg)   Physical Exam Vitals reviewed.  Constitutional:      Appearance: Normal appearance.  Cardiovascular:     Rate and Rhythm: Normal rate and regular rhythm.     Pulses: Normal pulses.     Heart sounds: Normal heart sounds.  Pulmonary:     Effort: Pulmonary effort is normal.     Breath sounds: Normal breath sounds.  Abdominal:     Palpations: Abdomen is soft. There is no hepatomegaly, splenomegaly or mass.     Tenderness: There is no abdominal tenderness.  Musculoskeletal:     Right lower leg: No edema.     Left lower leg: No edema.  Neurological:     General: No focal deficit present.     Mental Status: He is alert and oriented to person, place, and time.  Psychiatric:        Mood and Affect: Mood normal.        Behavior: Behavior normal.     LABORATORY DATA:  I have reviewed  the labs as listed.  CBC Latest Ref Rng & Units 08/15/2021 02/03/2021 07/24/2020  WBC 4.0 - 10.5 K/uL 5.3 4.1 5.1  Hemoglobin 13.0 - 17.0 g/dL 11.8(L) 13.7 12.7(L)  Hematocrit 39.0 - 52.0 % 34.9(L) 41.0 39.3  Platelets 150 - 400 K/uL 122(L) 104(L) 153   CMP Latest Ref Rng & Units 08/15/2021 02/03/2021 07/24/2020  Glucose 70 - 99  mg/dL 103(H) 114(H) 114(H)  BUN 8 - 23 mg/dL 12 16 15   Creatinine 0.61 - 1.24 mg/dL 1.15 1.44(H) 1.19  Sodium 135 - 145 mmol/L 136 138 140  Potassium 3.5 - 5.1 mmol/L 3.4(L) 4.0 3.9  Chloride 98 - 111 mmol/L 101 101 104  CO2 22 - 32 mmol/L 27 28 27   Calcium 8.9 - 10.3 mg/dL 8.9 9.1 8.7(L)  Total Protein 6.5 - 8.1 g/dL 7.4 8.1 7.2  Total Bilirubin 0.3 - 1.2 mg/dL 1.2 1.3(H) 0.5  Alkaline Phos 38 - 126 U/L 52 50 48  AST 15 - 41 U/L 42(H) 56(H) 33  ALT 0 - 44 U/L 24 35 23    DIAGNOSTIC IMAGING:  I have independently reviewed the scans and discussed with the patient. No results found.   ASSESSMENT:  1. Well- differentiated neuroendocrine carcinoid tumor: -Polypectomy by Dr. Oneida Alar on 08/14/2014 and 10/08/2015 with negative octreotide scan on 09/19/2014. -His case was presented at GI tumor board in Feb/March 2016 with recommendation for anatomic biopsies to guide role of surgical resection.  Anatomic biopsies in 03/2016 were negative. -His last 2 chromogranin A levels have been elevated January 2020 it was 186.0.  August 2020 it was 180.4.  Dr. Oneida Alar is aware of elevated levels and has seen the patient in consult and advised EGD.  Patient has refused at this time. -CT abdomen pelvis was negative for disease recurrence. -His last DOT scan was done in February 2020 was negative for any disease.     2.  Pulmonary nodule: -6 mm LUL pulmonary nodule on neck PET scan done on 10/18/2018. -CT chest from September 2020 revealed stable left lung nodule. -Repeat CT chest from 07/11/2020 showed interval development of multiple patchy areas of groundglass attenuation scattered throughout both lungs.  Other nonspecific these are favored to represent sequelae of inflammation or infection.  Atypical viral infection not excluded.  This is likely related to recent COVID-19 diagnosis.  The 7 mm pulmonary nodule remains stable.  Thought to be a benign perifissural lymph node.   3.  Elevated LFTs: -Likely  secondary to alcohol abuse. -PET scan did not reveal any acute liver abnormalities.     PLAN:  1. Well- differentiated neuroendocrine carcinoid tumor: - He does not have any signs or symptoms of carcinoid syndrome. - Reviewed his labs which showed stable LFTs.  Chromogranin is a mildly elevated at 151 and stable. - RTC 6 months with repeat labs.  If there is any significant changes in the tumor marker, will consider imaging.   2.  Elevated LFTs: - Mildly elevated AST is stable.   3.  Low vitamin D levels: - Continue vitamin D 1000 units daily.  Vitamin D is 49.  4.  Low B12 levels: - His B12 level has come down to 240 with decreasing hemoglobin 11.8 and macrocytosis with MCV 100.7. - He was receiving B12 injections in the past.  We will give him 1 injection of B12 today.  He was told to start taking B12 1 mg tablet daily.   Orders placed this encounter:  No orders of the defined types  were placed in this encounter.    Derek Jack, MD [facility] [phone]   I, Thana Ates, am acting as a scribe for Dr. Derek Jack.  I, Derek Jack MD, have reviewed the above documentation for accuracy and completeness, and I agree with the above.

## 2021-08-19 ENCOUNTER — Inpatient Hospital Stay (HOSPITAL_COMMUNITY): Payer: Medicare Other

## 2021-08-19 ENCOUNTER — Other Ambulatory Visit (HOSPITAL_COMMUNITY): Payer: Self-pay | Admitting: *Deleted

## 2021-08-19 ENCOUNTER — Other Ambulatory Visit: Payer: Self-pay

## 2021-08-19 ENCOUNTER — Inpatient Hospital Stay (HOSPITAL_BASED_OUTPATIENT_CLINIC_OR_DEPARTMENT_OTHER): Payer: Medicare Other | Admitting: Hematology

## 2021-08-19 VITALS — BP 171/99 | HR 83 | Temp 97.5°F | Resp 18 | Ht 75.0 in | Wt 240.5 lb

## 2021-08-19 DIAGNOSIS — C7A092 Malignant carcinoid tumor of the stomach: Secondary | ICD-10-CM | POA: Diagnosis not present

## 2021-08-19 DIAGNOSIS — L249 Irritant contact dermatitis, unspecified cause: Secondary | ICD-10-CM | POA: Diagnosis not present

## 2021-08-19 DIAGNOSIS — D3A8 Other benign neuroendocrine tumors: Secondary | ICD-10-CM

## 2021-08-19 MED ORDER — CYANOCOBALAMIN 1000 MCG/ML IJ SOLN
1000.0000 ug | Freq: Once | INTRAMUSCULAR | Status: AC
  Administered 2021-08-19: 09:00:00 1000 ug via INTRAMUSCULAR
  Filled 2021-08-19: qty 1

## 2021-08-19 NOTE — Patient Instructions (Signed)
[facility]  Discharge Instructions: Thank you for choosing [facility] to provide your oncology and hematology care.  If you have a lab appointment with the [facility], please come in thru the Main Entrance and check in at the main information desk.  Wear comfortable clothing and clothing appropriate for easy access to any Portacath or PICC line.   We strive to give you quality time with your provider. You may need to reschedule your appointment if you arrive late (15 or more minutes).  Arriving late affects you and other patients whose appointments are after yours.  Also, if you miss three or more appointments without notifying the office, you may be dismissed from the clinic at the providers discretion.      For prescription refill requests, have your pharmacy contact our office and allow 72 hours for refills to be completed.    Today you received the Vitamin B12 injection.      To help prevent nausea and vomiting after your treatment, we encourage you to take your nausea medication as directed.  BELOW ARE SYMPTOMS THAT SHOULD BE REPORTED IMMEDIATELY: *FEVER GREATER THAN 100.4 F (38 C) OR HIGHER *CHILLS OR SWEATING *NAUSEA AND VOMITING THAT IS NOT CONTROLLED WITH YOUR NAUSEA MEDICATION *UNUSUAL SHORTNESS OF BREATH *UNUSUAL BRUISING OR BLEEDING *URINARY PROBLEMS (pain or burning when urinating, or frequent urination) *BOWEL PROBLEMS (unusual diarrhea, constipation, pain near the anus) TENDERNESS IN MOUTH AND THROAT WITH OR WITHOUT PRESENCE OF ULCERS (sore throat, sores in mouth, or a toothache) UNUSUAL RASH, SWELLING OR PAIN  UNUSUAL VAGINAL DISCHARGE OR ITCHING   Items with * indicate a potential emergency and should be followed up as soon as possible or go to the Emergency Department if any problems should occur.  Please show the CHEMOTHERAPY ALERT CARD or IMMUNOTHERAPY ALERT CARD at check-in to the Emergency Department and triage nurse.  Should you  have questions after your visit or need to cancel or reschedule your appointment, please contact [facility] [phone]  and follow the prompts.  Office hours are 8:00 a.m. to 4:30 p.m. Monday - Friday. Please note that voicemails left after 4:00 p.m. may not be returned until the following business day.  We are closed weekends and major holidays. You have access to a nurse at all times for urgent questions. Please call the main number to the clinic [phone] and follow the prompts.  For any non-urgent questions, you may also contact your provider using MyChart. We now offer e-Visits for anyone 40 and older to request care online for non-urgent symptoms. For details visit [email or web address].   Also download the MyChart app! Go to the app store, search "MyChart", open the app, select [facility], and log in with your MyChart username and password.  Due to Covid, a mask is required upon entering the hospital/clinic. If you do not have a mask, one will be given to you upon arrival. For doctor visits, patients may have 1 support person aged 25 or older with them. For treatment visits, patients cannot have anyone with them due to current Covid guidelines and our immunocompromised population.

## 2021-08-19 NOTE — Patient Instructions (Addendum)
[facility] at [facility] Discharge Instructions   You were seen and examined today by Dr. Delton Coombes.  He reviewed your lab results with you which are normal/stable.  However, your hemoglobin and B12 levels have dropped.  We will give you a B12 injection today.  You then should start taking B12 tablet (over the counter) every day.   Continue taking Vitamin D as you have been.  We will see you back in 6 months to repeat your lab work and review results.    Thank you for choosing [facility] at [facility] to provide your oncology and hematology care.  To afford each patient quality time with our provider, please arrive at least 15 minutes before your scheduled appointment time.   If you have a lab appointment with the [facility] please come in thru the Main Entrance and check in at the main information desk.  You need to re-schedule your appointment should you arrive 10 or more minutes late.  We strive to give you quality time with our providers, and arriving late affects you and other patients whose appointments are after yours.  Also, if you no show three or more times for appointments you may be dismissed from the clinic at the providers discretion.     Again, thank you for choosing [facility].  Our hope is that these requests will decrease the amount of time that you wait before being seen by our physicians.       _____________________________________________________________  Should you have questions after your visit to [facility], please contact our office at [phone] and follow the prompts.  Our office hours are 8:00 a.m. and 4:30 p.m. Monday - Friday.  Please note that voicemails left after 4:00 p.m. may not be returned until the following business day.  We are closed weekends and major holidays.  You do have access to a nurse 24-7, just call the main number to the clinic [phone] and do not press any  options, hold on the line and a nurse will answer the phone.    For prescription refill requests, have your pharmacy contact our office and allow 72 hours.    Due to Covid, you will need to wear a mask upon entering the hospital. If you do not have a mask, a mask will be given to you at the Main Entrance upon arrival. For doctor visits, patients may have 1 support person age 72 or older with them. For treatment visits, patients can not have anyone with them due to social distancing guidelines and our immunocompromised population.

## 2021-08-19 NOTE — Progress Notes (Signed)
[name] presents today for injection per the provider's orders.  Vitamin B12 administration without incident; injection site WNL; see MAR for injection details.  Patient tolerated procedure well and without incident. Patient remained stable throughout visit.  No questions or complaints noted at this time. Patient discharged ambulatory and in stable condition.

## 2021-09-15 ENCOUNTER — Telehealth: Payer: Self-pay

## 2021-09-15 NOTE — Telephone Encounter (Signed)
Hydrocodone-Acetaminophen 5/325 mg  Qty 54 Tablets  PATIENT USES [facility]

## 2021-09-16 MED ORDER — HYDROCODONE-ACETAMINOPHEN 5-325 MG PO TABS: ORAL_TABLET | ORAL | 0 refills | Status: DC

## 2021-10-02 ENCOUNTER — Other Ambulatory Visit: Payer: Self-pay

## 2021-10-02 ENCOUNTER — Ambulatory Visit (INDEPENDENT_AMBULATORY_CARE_PROVIDER_SITE_OTHER): Payer: Medicare Other | Admitting: Orthopaedic Surgery

## 2021-10-02 ENCOUNTER — Encounter: Payer: Self-pay | Admitting: Orthopaedic Surgery

## 2021-10-02 VITALS — BP 176/110 | HR 77 | Ht 75.0 in | Wt 240.0 lb

## 2021-10-02 DIAGNOSIS — M1A0410 Idiopathic chronic gout, right hand, without tophus (tophi): Secondary | ICD-10-CM

## 2021-10-02 NOTE — Progress Notes (Signed)
My gout is better.  He has not had a gout attack in a while.  His hand have less swelling and pain.  He has no redness.  He is taking his medicine.  Both hands do not have swelling today and full ROM.  NV intact. He has swelling of radial right wrist but no redness.  Encounter Diagnosis  Name Primary?   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  Return in three months.  Call if any problem.  Precautions discussed.  Electronically Signed Sanjuana Kava, MD 2/2/20239:35 AM

## 2021-10-16 ENCOUNTER — Telehealth: Payer: Self-pay | Admitting: Orthopaedic Surgery

## 2021-10-16 MED ORDER — HYDROCODONE-ACETAMINOPHEN 5-325 MG PO TABS: ORAL_TABLET | ORAL | 0 refills | Status: DC

## 2021-10-16 NOTE — Telephone Encounter (Signed)
Patient called for refill: HYDROcodone-acetaminophen (NORCO/VICODIN) 5-325 MG tablet 54 tablet       [facility]

## 2021-10-30 DIAGNOSIS — F17200 Nicotine dependence, unspecified, uncomplicated: Secondary | ICD-10-CM | POA: Insufficient documentation

## 2021-11-13 ENCOUNTER — Telehealth: Payer: Self-pay

## 2021-11-13 MED ORDER — HYDROCODONE-ACETAMINOPHEN 5-325 MG PO TABS: ORAL_TABLET | ORAL | 0 refills | Status: DC

## 2021-11-13 NOTE — Telephone Encounter (Signed)
Hydrocodone-Acetaminophen 5/325 mg  Qty 54 tablets ? ? ?PATIENT USES [facility] ?

## 2021-11-25 ENCOUNTER — Telehealth: Payer: Self-pay | Admitting: Orthopaedic Surgery

## 2021-11-26 IMAGING — DX DG CHEST 1V PORT
1 series · 1 of 1 positions shown · non-contrast
Comparison: 05/11/2019

CLINICAL DATA: Cough and fever

EXAM:
PORTABLE CHEST 1 VIEW

[chest ap]
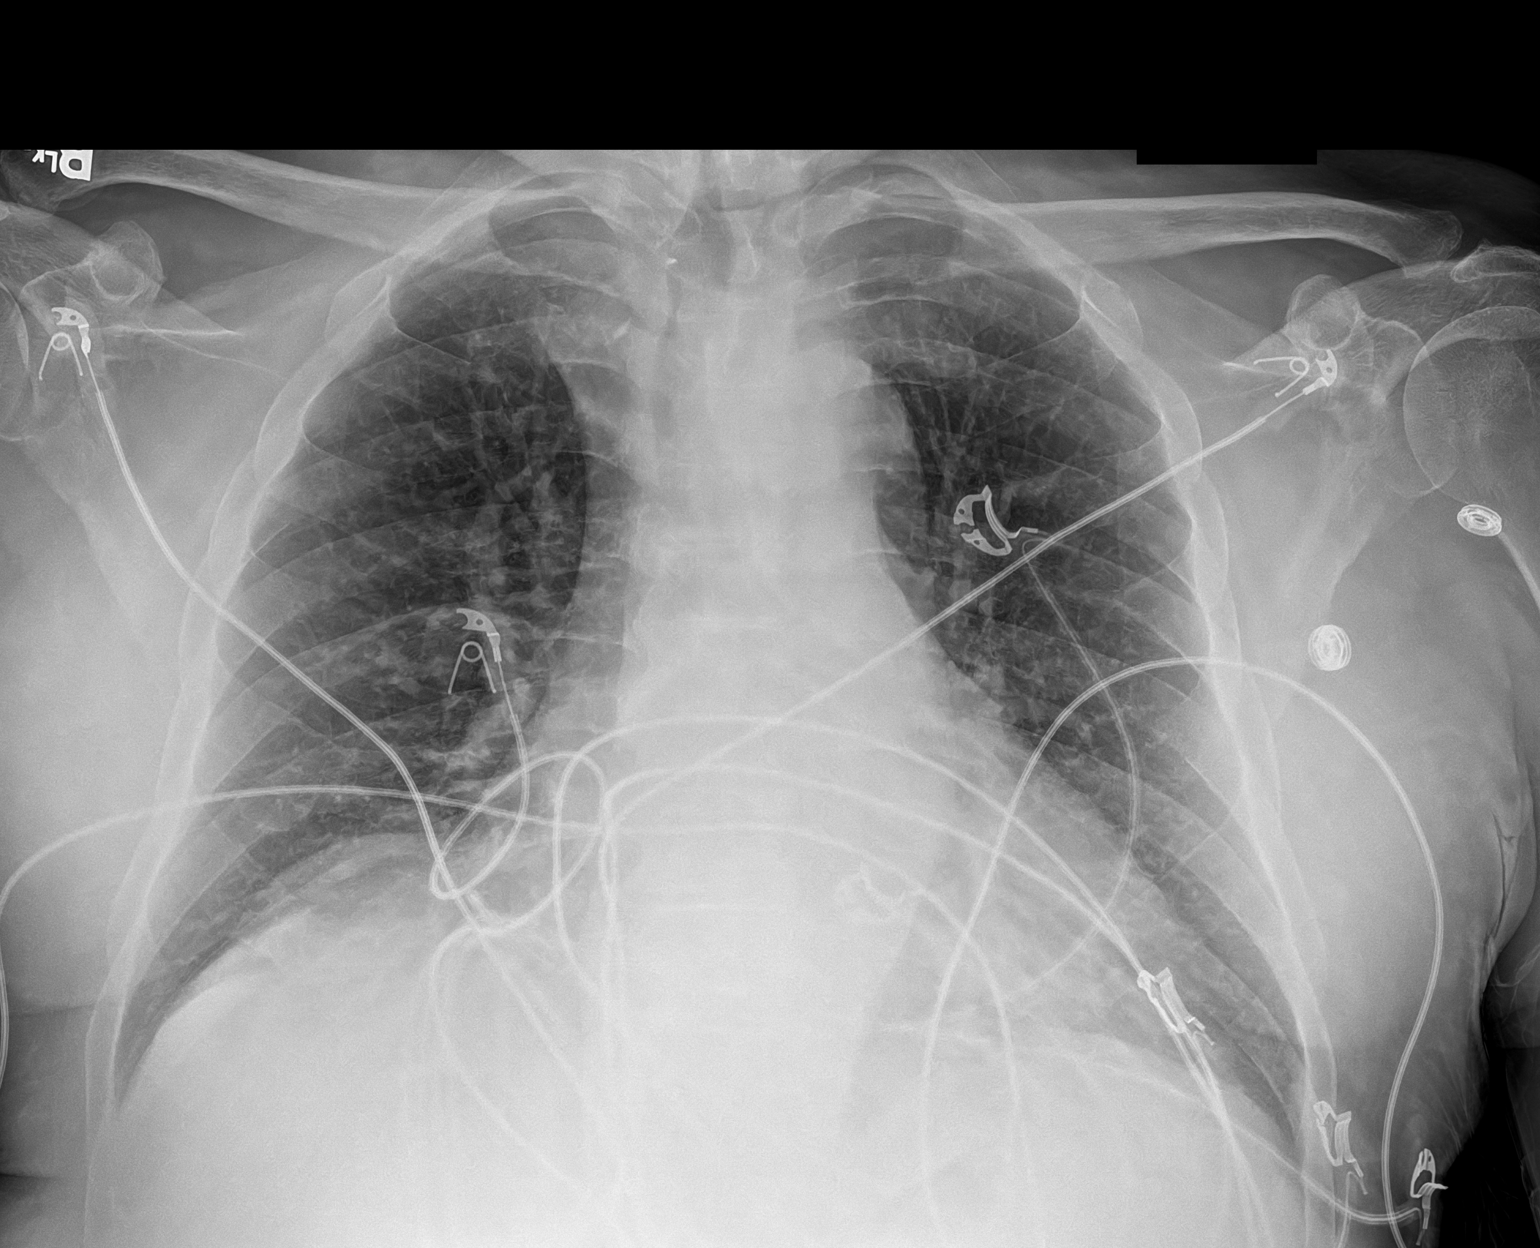

[1 of 1 positions shown; findings below may reference images not displayed]

FINDINGS: Cardiac shadow is within normal limits. Lungs are well aerated
bilaterally. Known left-sided nodule is not well appreciated due to
its size. No other focal abnormality is noted.
IMPRESSION: No active disease.

## 2021-12-16 ENCOUNTER — Telehealth: Payer: Self-pay | Admitting: Radiology

## 2021-12-16 MED ORDER — HYDROCODONE-ACETAMINOPHEN 5-325 MG PO TABS: ORAL_TABLET | ORAL | 0 refills | Status: DC

## 2021-12-30 ENCOUNTER — Ambulatory Visit (INDEPENDENT_AMBULATORY_CARE_PROVIDER_SITE_OTHER): Payer: Medicare Other | Admitting: Orthopaedic Surgery

## 2021-12-30 ENCOUNTER — Encounter: Payer: Self-pay | Admitting: Orthopaedic Surgery

## 2021-12-30 VITALS — BP 160/102 | HR 85 | Ht 75.0 in | Wt 240.0 lb

## 2021-12-30 DIAGNOSIS — M1A0410 Idiopathic chronic gout, right hand, without tophus (tophi): Secondary | ICD-10-CM

## 2021-12-30 DIAGNOSIS — G894 Chronic pain syndrome: Secondary | ICD-10-CM | POA: Diagnosis not present

## 2021-12-30 NOTE — Progress Notes (Signed)
My gout is doing better. ? ?He has gout of both hands with tophus on the right dorsal wrist.  He is taking his gout medicine.  He has not had attack recently. ? ?Right wrist has good ROM with gouty tophus radial and dorsal with no redness.  NV intact.  Left wrist has full ROM.  NV intact.  Fingers are not swollen. ? ?Encounter Diagnoses  ?Name Primary?  ? 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 Chronic pain disorder   ? ?Continue his medicine. ? ?Return in three months. ? ?Call if any problem. ? ?Precautions discussed. ? ?Electronically Signed ?Sanjuana Kava, MD ?5/2/20239:30 AM ? ?

## 2022-01-13 ENCOUNTER — Other Ambulatory Visit: Payer: Self-pay | Admitting: Orthopaedic Surgery

## 2022-01-13 MED ORDER — HYDROCODONE-ACETAMINOPHEN 5-325 MG PO TABS: ORAL_TABLET | ORAL | 0 refills | Status: DC

## 2022-01-13 NOTE — Telephone Encounter (Signed)
Patient called requesting refill for his pain medicine.    HYDROcodone-acetaminophen (NORCO/VICODIN) 5-325 MG tablet  Pharmacy [facility]   

## 2022-01-28 DIAGNOSIS — I718 Aortic aneurysm of unspecified site, ruptured: Secondary | ICD-10-CM | POA: Insufficient documentation

## 2022-01-28 DIAGNOSIS — D6859 Other primary thrombophilia: Secondary | ICD-10-CM | POA: Insufficient documentation

## 2022-02-12 ENCOUNTER — Telehealth: Payer: Self-pay | Admitting: Orthopaedic Surgery

## 2022-02-12 MED ORDER — HYDROCODONE-ACETAMINOPHEN 5-325 MG PO TABS: ORAL_TABLET | ORAL | 0 refills | Status: DC

## 2022-02-12 NOTE — Telephone Encounter (Signed)
Patient requests refill: HYDROcodone-acetaminophen (NORCO/VICODIN) 5-325 MG tablet 54 tablet       [facility]

## 2022-02-13 ENCOUNTER — Inpatient Hospital Stay (HOSPITAL_COMMUNITY): Payer: Medicare Other | Attending: Hematology

## 2022-02-13 DIAGNOSIS — Z79899 Other long term (current) drug therapy: Secondary | ICD-10-CM | POA: Diagnosis not present

## 2022-02-13 DIAGNOSIS — C7A8 Other malignant neuroendocrine tumors: Secondary | ICD-10-CM | POA: Insufficient documentation

## 2022-02-13 DIAGNOSIS — Z833 Family history of diabetes mellitus: Secondary | ICD-10-CM | POA: Diagnosis not present

## 2022-02-13 DIAGNOSIS — D3A8 Other benign neuroendocrine tumors: Secondary | ICD-10-CM

## 2022-02-13 DIAGNOSIS — Z8249 Family history of ischemic heart disease and other diseases of the circulatory system: Secondary | ICD-10-CM | POA: Insufficient documentation

## 2022-02-13 DIAGNOSIS — R7989 Other specified abnormal findings of blood chemistry: Secondary | ICD-10-CM | POA: Diagnosis not present

## 2022-02-13 DIAGNOSIS — R911 Solitary pulmonary nodule: Secondary | ICD-10-CM | POA: Insufficient documentation

## 2022-02-13 DIAGNOSIS — E538 Deficiency of other specified B group vitamins: Secondary | ICD-10-CM | POA: Diagnosis not present

## 2022-02-13 DIAGNOSIS — E559 Vitamin D deficiency, unspecified: Secondary | ICD-10-CM | POA: Insufficient documentation

## 2022-02-13 DIAGNOSIS — Z841 Family history of disorders of kidney and ureter: Secondary | ICD-10-CM | POA: Diagnosis not present

## 2022-02-13 LAB — CBC WITH DIFFERENTIAL/PLATELET
Abs Immature Granulocytes: 0.02 10*3/uL (ref 0.00–0.07)
Basophils Absolute: 0 10*3/uL (ref 0.0–0.1)
Basophils Relative: 1 %
Eosinophils Absolute: 0.3 10*3/uL (ref 0.0–0.5)
Eosinophils Relative: 5 %
HCT: 39.8 % (ref 39.0–52.0)
Hemoglobin: 13.1 g/dL (ref 13.0–17.0)
Immature Granulocytes: 0 %
Lymphocytes Relative: 44 %
Lymphs Abs: 3 10*3/uL (ref 0.7–4.0)
MCH: 32.3 pg (ref 26.0–34.0)
MCHC: 32.9 g/dL (ref 30.0–36.0)
MCV: 98 fL (ref 80.0–100.0)
Monocytes Absolute: 0.8 10*3/uL (ref 0.1–1.0)
Monocytes Relative: 13 %
Neutro Abs: 2.5 10*3/uL (ref 1.7–7.7)
Neutrophils Relative %: 37 %
Platelets: 131 10*3/uL — ABNORMAL LOW (ref 150–400)
RBC: 4.06 MIL/uL — ABNORMAL LOW (ref 4.22–5.81)
RDW: 12.7 % (ref 11.5–15.5)
WBC: 6.6 10*3/uL (ref 4.0–10.5)
nRBC: 0 % (ref 0.0–0.2)

## 2022-02-13 LAB — COMPREHENSIVE METABOLIC PANEL
ALT: 35 U/L (ref 0–44)
AST: 76 U/L — ABNORMAL HIGH (ref 15–41)
Albumin: 3.8 g/dL (ref 3.5–5.0)
Alkaline Phosphatase: 51 U/L (ref 38–126)
Anion gap: 11 (ref 5–15)
BUN: 24 mg/dL — ABNORMAL HIGH (ref 8–23)
CO2: 25 mmol/L (ref 22–32)
Calcium: 9.3 mg/dL (ref 8.9–10.3)
Chloride: 100 mmol/L (ref 98–111)
Creatinine, Ser: 1.88 mg/dL — ABNORMAL HIGH (ref 0.61–1.24)
GFR, Estimated: 37 mL/min — ABNORMAL LOW (ref >60)
Glucose, Bld: 108 mg/dL — ABNORMAL HIGH (ref 70–99)
Potassium: 3.8 mmol/L (ref 3.5–5.1)
Sodium: 136 mmol/L (ref 135–145)
Total Bilirubin: 0.8 mg/dL (ref 0.3–1.2)
Total Protein: 7.9 g/dL (ref 6.5–8.1)

## 2022-02-13 LAB — VITAMIN D 25 HYDROXY (VIT D DEFICIENCY, FRACTURES): Vit D, 25-Hydroxy: 41.83 ng/mL (ref 30–100)

## 2022-02-13 LAB — VITAMIN B12: Vitamin B-12: 1141 pg/mL — ABNORMAL HIGH (ref 180–914)

## 2022-02-13 LAB — LACTATE DEHYDROGENASE: LDH: 124 U/L (ref 98–192)

## 2022-02-16 ENCOUNTER — Other Ambulatory Visit (HOSPITAL_COMMUNITY): Payer: Medicare Other

## 2022-02-16 LAB — CHROMOGRANIN A: Chromogranin A (ng/mL): 251.4 ng/mL — ABNORMAL HIGH (ref 0.0–101.8)

## 2022-02-23 ENCOUNTER — Inpatient Hospital Stay (HOSPITAL_BASED_OUTPATIENT_CLINIC_OR_DEPARTMENT_OTHER): Payer: Medicare Other | Admitting: Hematology

## 2022-02-23 VITALS — BP 140/102 | HR 92 | Temp 98.4°F | Resp 18 | Ht 75.0 in | Wt 234.5 lb

## 2022-02-23 DIAGNOSIS — D3A8 Other benign neuroendocrine tumors: Secondary | ICD-10-CM

## 2022-02-23 DIAGNOSIS — L249 Irritant contact dermatitis, unspecified cause: Secondary | ICD-10-CM | POA: Diagnosis not present

## 2022-02-23 DIAGNOSIS — C7A8 Other malignant neuroendocrine tumors: Secondary | ICD-10-CM | POA: Diagnosis not present

## 2022-03-17 ENCOUNTER — Telehealth: Payer: Self-pay | Admitting: Orthopaedic Surgery

## 2022-03-17 ENCOUNTER — Telehealth: Payer: Self-pay

## 2022-03-17 MED ORDER — HYDROCODONE-ACETAMINOPHEN 5-325 MG PO TABS: ORAL_TABLET | ORAL | 0 refills | Status: DC

## 2022-03-17 NOTE — Telephone Encounter (Signed)
Refill sent to provider

## 2022-03-17 NOTE — Telephone Encounter (Signed)
Patient called requesting refill for his pain medicine.    HYDROcodone-acetaminophen (NORCO/VICODIN) 5-325 MG tablet  Pharmacy [facility]

## 2022-03-17 NOTE — Telephone Encounter (Signed)
Sent to provider 

## 2022-03-31 ENCOUNTER — Telehealth: Payer: Self-pay | Admitting: Orthopaedic Surgery

## 2022-04-01 ENCOUNTER — Encounter: Payer: Self-pay | Admitting: Orthopaedic Surgery

## 2022-04-01 ENCOUNTER — Ambulatory Visit (INDEPENDENT_AMBULATORY_CARE_PROVIDER_SITE_OTHER): Payer: Medicare Other | Admitting: Orthopaedic Surgery

## 2022-04-01 DIAGNOSIS — M1A0410 Idiopathic chronic gout, right hand, without tophus (tophi): Secondary | ICD-10-CM | POA: Diagnosis not present

## 2022-04-01 DIAGNOSIS — G894 Chronic pain syndrome: Secondary | ICD-10-CM | POA: Diagnosis not present

## 2022-04-01 NOTE — Progress Notes (Signed)
My hands do not hurt today  He has less swelling of the fingers and wrists.  He has been taking his allopurinol.  He has no new gout attacks.  He has no new trauma.  Both hands have no swelling of the fingers.  The right wrist has less swelling than in the past.  NV intact. Grips good.  Encounter Diagnoses  Name Primary?   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  Return in three months.  Call if any problem.  Precautions discussed.  Electronically Signed Sanjuana Kava, MD 8/2/20239:15 AM

## 2022-04-02 ENCOUNTER — Ambulatory Visit: Payer: Medicare Other | Admitting: Orthopaedic Surgery

## 2022-04-15 ENCOUNTER — Telehealth: Payer: Self-pay | Admitting: Orthopaedic Surgery

## 2022-04-15 NOTE — Telephone Encounter (Signed)
Patient called for refill: HYDROcodone-acetaminophen (NORCO/VICODIN) 5-325 MG tablet 54 tablet  Pharmacy: [facility]

## 2022-04-16 MED ORDER — HYDROCODONE-ACETAMINOPHEN 5-325 MG PO TABS: ORAL_TABLET | ORAL | 0 refills | Status: DC

## 2022-04-28 DIAGNOSIS — I129 Hypertensive chronic kidney disease with stage 1 through stage 4 chronic kidney disease, or unspecified chronic kidney disease: Secondary | ICD-10-CM | POA: Insufficient documentation

## 2022-04-28 DIAGNOSIS — N1831 Chronic kidney disease, stage 3a: Secondary | ICD-10-CM | POA: Insufficient documentation

## 2022-05-18 ENCOUNTER — Telehealth: Payer: Self-pay | Admitting: Orthopedic Surgery

## 2022-05-18 ENCOUNTER — Telehealth: Payer: Self-pay | Admitting: Orthopaedic Surgery

## 2022-05-18 MED ORDER — HYDROCODONE-ACETAMINOPHEN 5-325 MG PO TABS: ORAL_TABLET | ORAL | 0 refills | Status: DC

## 2022-05-18 NOTE — Telephone Encounter (Signed)
No message

## 2022-05-18 NOTE — Telephone Encounter (Signed)
Patient called to request:  HYDROcodone-acetaminophen (NORCO/VICODIN) 5-325 MG tablet 54 tablet       [facility]

## 2022-06-15 ENCOUNTER — Telehealth: Payer: Self-pay

## 2022-06-15 MED ORDER — HYDROCODONE-ACETAMINOPHEN 5-325 MG PO TABS: ORAL_TABLET | ORAL | 0 refills | Status: DC

## 2022-06-15 NOTE — Telephone Encounter (Signed)
Hydrocodone-Acetaminophen 5/325 MG Qty 54 Tablets  PATIENT USES [facility] 

## 2022-07-02 ENCOUNTER — Encounter: Payer: Self-pay | Admitting: Orthopaedic Surgery

## 2022-07-02 ENCOUNTER — Ambulatory Visit (INDEPENDENT_AMBULATORY_CARE_PROVIDER_SITE_OTHER): Payer: Medicare Other | Admitting: Orthopaedic Surgery

## 2022-07-02 VITALS — BP 164/96 | HR 77 | Ht 75.0 in | Wt 234.0 lb

## 2022-07-02 DIAGNOSIS — G894 Chronic pain syndrome: Secondary | ICD-10-CM | POA: Diagnosis not present

## 2022-07-02 DIAGNOSIS — M1A0410 Idiopathic chronic gout, right hand, without tophus (tophi): Secondary | ICD-10-CM | POA: Diagnosis not present

## 2022-07-02 NOTE — Progress Notes (Signed)
My hand is OK.  He has not had a gout attack of the hands or wrist since last seen.  He does get some swelling at times.  He is taking his allopurinol daily.  He has no new trauma.  Right radial side of wrist has chronic swelling but ROM full and NV intact.  Both hands have good ROM and no swelling.   Encounter Diagnoses  Name Primary?   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  Return in three months.  Call if any problem.  Precautions discussed.  Electronically Signed Sanjuana Kava, MD 11/2/20239:08 AM

## 2022-07-16 ENCOUNTER — Telehealth: Payer: Self-pay

## 2022-07-16 MED ORDER — HYDROCODONE-ACETAMINOPHEN 5-325 MG PO TABS: ORAL_TABLET | ORAL | 0 refills | Status: DC

## 2022-07-16 NOTE — Telephone Encounter (Signed)
Hydrocodone-Acetaminophen 5/325 MG Qty 54 Tablets  PATIENT USES [facility] 

## 2022-07-16 NOTE — Telephone Encounter (Signed)
REQUEST SENT TO PROVIDER

## 2022-07-21 ENCOUNTER — Telehealth: Payer: Self-pay | Admitting: Orthopaedic Surgery

## 2022-08-12 ENCOUNTER — Telehealth: Payer: Self-pay | Admitting: Orthopaedic Surgery

## 2022-08-12 NOTE — Telephone Encounter (Signed)
Patient presented to the office requesting a refill on Hydrocodone 5-325, 54 tablets to be called to [facility].  Pt's # [phone]

## 2022-08-13 MED ORDER — HYDROCODONE-ACETAMINOPHEN 5-325 MG PO TABS: ORAL_TABLET | ORAL | 0 refills | Status: DC

## 2022-08-13 NOTE — Telephone Encounter (Signed)
Sent to provider 

## 2022-09-01 ENCOUNTER — Inpatient Hospital Stay: Payer: 59 | Attending: Hematology

## 2022-09-01 DIAGNOSIS — R7989 Other specified abnormal findings of blood chemistry: Secondary | ICD-10-CM | POA: Insufficient documentation

## 2022-09-01 DIAGNOSIS — C7A090 Malignant carcinoid tumor of the bronchus and lung: Secondary | ICD-10-CM | POA: Insufficient documentation

## 2022-09-08 ENCOUNTER — Ambulatory Visit: Payer: Medicare Other | Admitting: Hematology

## 2022-09-14 ENCOUNTER — Telehealth: Payer: Self-pay | Admitting: Orthopaedic Surgery

## 2022-09-14 ENCOUNTER — Telehealth: Payer: Self-pay

## 2022-09-14 MED ORDER — HYDROCODONE-ACETAMINOPHEN 5-325 MG PO TABS: ORAL_TABLET | ORAL | 0 refills | Status: DC

## 2022-09-14 NOTE — Telephone Encounter (Signed)
Patient presented to the office requesting a refill for his Hydrocodone 5-325 to be sent to [facility].  Pt's # [phone]

## 2022-09-21 ENCOUNTER — Inpatient Hospital Stay: Payer: 59

## 2022-09-21 DIAGNOSIS — C7A090 Malignant carcinoid tumor of the bronchus and lung: Secondary | ICD-10-CM | POA: Diagnosis present

## 2022-09-21 DIAGNOSIS — D3A8 Other benign neuroendocrine tumors: Secondary | ICD-10-CM

## 2022-09-21 DIAGNOSIS — R7989 Other specified abnormal findings of blood chemistry: Secondary | ICD-10-CM | POA: Diagnosis not present

## 2022-09-21 LAB — CBC WITH DIFFERENTIAL/PLATELET
Abs Immature Granulocytes: 0.01 10*3/uL (ref 0.00–0.07)
Basophils Absolute: 0 10*3/uL (ref 0.0–0.1)
Basophils Relative: 1 %
Eosinophils Absolute: 0.6 10*3/uL — ABNORMAL HIGH (ref 0.0–0.5)
Eosinophils Relative: 11 %
HCT: 42.5 % (ref 39.0–52.0)
Hemoglobin: 14.1 g/dL (ref 13.0–17.0)
Immature Granulocytes: 0 %
Lymphocytes Relative: 40 %
Lymphs Abs: 2.2 10*3/uL (ref 0.7–4.0)
MCH: 32.8 pg (ref 26.0–34.0)
MCHC: 33.2 g/dL (ref 30.0–36.0)
MCV: 98.8 fL (ref 80.0–100.0)
Monocytes Absolute: 0.5 10*3/uL (ref 0.1–1.0)
Monocytes Relative: 9 %
Neutro Abs: 2.1 10*3/uL (ref 1.7–7.7)
Neutrophils Relative %: 39 %
Platelets: 127 10*3/uL — ABNORMAL LOW (ref 150–400)
RBC: 4.3 MIL/uL (ref 4.22–5.81)
RDW: 13.9 % (ref 11.5–15.5)
WBC: 5.4 10*3/uL (ref 4.0–10.5)
nRBC: 0 % (ref 0.0–0.2)

## 2022-09-21 LAB — COMPREHENSIVE METABOLIC PANEL
ALT: 43 U/L (ref 0–44)
AST: 73 U/L — ABNORMAL HIGH (ref 15–41)
Albumin: 3.5 g/dL (ref 3.5–5.0)
Alkaline Phosphatase: 53 U/L (ref 38–126)
Anion gap: 8 (ref 5–15)
BUN: 13 mg/dL (ref 8–23)
CO2: 26 mmol/L (ref 22–32)
Calcium: 9.2 mg/dL (ref 8.9–10.3)
Chloride: 99 mmol/L (ref 98–111)
Creatinine, Ser: 1.19 mg/dL (ref 0.61–1.24)
GFR, Estimated: >60 mL/min (ref >60)
Glucose, Bld: 116 mg/dL — ABNORMAL HIGH (ref 70–99)
Potassium: 3.4 mmol/L — ABNORMAL LOW (ref 3.5–5.1)
Sodium: 133 mmol/L — ABNORMAL LOW (ref 135–145)
Total Bilirubin: 1.1 mg/dL (ref 0.3–1.2)
Total Protein: 7.8 g/dL (ref 6.5–8.1)

## 2022-09-22 LAB — CHROMOGRANIN A: Chromogranin A (ng/mL): 137.6 ng/mL — ABNORMAL HIGH (ref 0.0–101.8)

## 2022-09-28 ENCOUNTER — Encounter: Payer: Self-pay | Admitting: Hematology

## 2022-09-28 ENCOUNTER — Inpatient Hospital Stay (HOSPITAL_BASED_OUTPATIENT_CLINIC_OR_DEPARTMENT_OTHER): Payer: 59 | Admitting: Hematology

## 2022-09-28 VITALS — BP 121/79 | HR 72 | Temp 97.8°F | Resp 18 | Ht 75.0 in | Wt 229.7 lb

## 2022-09-28 DIAGNOSIS — D3A8 Other benign neuroendocrine tumors: Secondary | ICD-10-CM | POA: Diagnosis not present

## 2022-09-28 DIAGNOSIS — C7A090 Malignant carcinoid tumor of the bronchus and lung: Secondary | ICD-10-CM | POA: Diagnosis not present

## 2022-09-28 NOTE — Progress Notes (Signed)
[place], [place] [address]   CLINIC:  Medical Oncology/Hematology  PCP:  Denyce Robert, [place] / [place] [address] [phone]   REASON FOR VISIT:  Follow-up for neuroendocrine tumor  CURRENT THERAPY: surveillance   INTERVAL HISTORY:  [name], a 72 y.o. male, seen for follow-up of neuroendocrine tumor.  He denies any abdominal pains.  Denies any flushing, wheezing.  Appetite has been good and weight is stable.  Energy levels are 75%.  REVIEW OF SYSTEMS:  Review of Systems  Constitutional:  Negative for appetite change and fatigue.  Respiratory:  Negative for wheezing.   Gastrointestinal:  Negative for abdominal pain and diarrhea.  All other systems reviewed and are negative.   PAST MEDICAL/SURGICAL HISTORY:  Past Medical History:  Diagnosis Date   Anemia    FeDA: GASTRIC POLYPS, B12; TCS 2008 EGD 2009, 2008-HB 11.1 MCV 83.6 CR 1.22, 2009 FERRITIN 102-22   B12 deficiency    Carcinoid tumor determined by biopsy of stomach 09/25/2014   Chronic knee pain    Diverticulosis of colon    Lower GI bleed 2008   Essential hypertension    GERD (gastroesophageal reflux disease)    Gout    Hepatomegaly    Hepatic steatosis   History of alcohol abuse    History of septic arthritis    Hypothyroidism    PUD    Substance abuse ([place])    Past Surgical History:  Procedure Laterality Date   BIOPSY  08/14/2014   Procedure: BIOPSY;  Surgeon: Danie Binder, MD;  Location: [facility];  Service: Endoscopy;;   BIOPSY  03/12/2015   Procedure: BIOPSY;  Surgeon: Danie Binder, MD;  Location: [facility];  Service: Endoscopy;;   BIOPSY  04/21/2016   Procedure: BIOPSY;  Surgeon: Danie Binder, MD;  [facility];  Service: Endoscopy;;  bx's of antrum, body of stomach, fundus, and cardia   CHOLECYSTECTOMY     COLONOSCOPY  2008 [facility] DJ   LGIB 2o to TICS, prep good   COLONOSCOPY WITH PROPOFOL N/A 08/14/2014    SLF: 1. Four large colon polyps removed. 2. The left colon is redundant 3. Moderate diverticulosis throughout teh entire examined colon   COLONOSCOPY WITH PROPOFOL N/A 11/06/2014   SLF: 8 small polyps removed. One large pedunculated polyp removed from the ascending colon, tubulovillous and tubular adenomas. Next colonoscopy March 2019   COLONOSCOPY WITH PROPOFOL N/A 01/04/2018   Procedure: COLONOSCOPY WITH PROPOFOL;  Surgeon: Danie Binder, MD;  [facility];  Service: Endoscopy;  Laterality: N/A;  10:00am   ESOPHAGOGASTRODUODENOSCOPY  12/08/2007   PTW:[facility] gastric polyps seen in the cardia and body of the stomach/Normal esophagus without evidence of Barrett's, mass, erosion/Normal duodenal bulb and second portion of the duodenum. Benign bx.   ESOPHAGOGASTRODUODENOSCOPY (EGD) WITH PROPOFOL N/A 08/14/2014   SLF: 1. Heme postive stools due to gastric and colon polyps 2. Multiple gastric    ESOPHAGOGASTRODUODENOSCOPY (EGD) WITH PROPOFOL N/A 03/12/2015   SLF: Multiple gastric nodules seen in gasric body/antrum. 2. Non-erosive gastritis ( inflammation) was found in the gastric antrum.    ESOPHAGOGASTRODUODENOSCOPY (EGD) WITH PROPOFOL N/A 10/08/2015   Procedure: ESOPHAGOGASTRODUODENOSCOPY (EGD) WITH PROPOFOL;  Surgeon: Danie Binder, MD;  [facility];  Service: Endoscopy;  Laterality: N/A;  0945   ESOPHAGOGASTRODUODENOSCOPY (EGD) WITH PROPOFOL N/A 04/21/2016   Procedure: ESOPHAGOGASTRODUODENOSCOPY (EGD) WITH PROPOFOL;  Surgeon: Danie Binder, MD;  [facility];  Service: Endoscopy;  Laterality: N/A;  730    HAND SURGERY Right    fracture repair with plates   INGUINAL HERNIA REPAIR Left 11/09/2018   Procedure: HERNIA REPAIR INGUINAL ADULT WITH MESH;  Surgeon: Virl Cagey, MD;  Location: [facility];  Service: General;  Laterality: Left;   JOINT REPLACEMENT Bilateral    knees   POLYPECTOMY  08/14/2014   Procedure: POLYPECTOMY;  Surgeon: Danie Binder, MD;  Location: [facility];  Service: Endoscopy;;   POLYPECTOMY N/A 11/06/2014   Procedure: POLYPECTOMY;  Surgeon: Danie Binder, MD;  Location: [facility];  Service: Endoscopy;  Laterality: N/A;  Transverse Colon x3, Ascending Colon x2, Descending Colon x3   REPLACEMENT TOTAL KNEE BILATERAL     UPPER GASTROINTESTINAL ENDOSCOPY  AP[facility] 2009   INFLAMED HYPERPLASTIC POLYPS, CHRONIC GASTRITIS    SOCIAL HISTORY:  Social History   Socioeconomic History   Marital status: Single    Spouse name: Not on file   Number of children: 0   Years of education: 3   Highest education level: Not on file  Occupational History   Occupation: retired    Comment: farming  Tobacco Use   Smoking status: Never   Smokeless tobacco: Never   Tobacco comments:    Quit x 10 years/ never smoked on regular basis  Vaping Use   Vaping Use: Never used  Substance and Sexual Activity   Alcohol use: Yes    Alcohol/week: 2.0 standard drinks of alcohol    Types: 2 Shots of liquor per week    Comment: drinks on weekends, gin/vodka 1/5th.   Drug use: No   Sexual activity: Never  Other Topics Concern   Not on file  Social History Narrative   HE DOES NOT HAVE ANY CHILDREN.   Lives alone   Does not drive   CAN NOT READ   Social Determinants of Health   Financial Resource Strain: Low Risk  (07/30/2020)   Overall Financial Resource Strain (CARDIA)    Difficulty of Paying Living Expenses: Not hard at all  Food Insecurity: No Food Insecurity (07/30/2020)   Hunger Vital Sign    Worried About Running Out of Food in the Last Year: Never true    Ran Out of Food in the Last Year: Never true  Transportation Needs: No Transportation Needs (07/30/2020)   PRAPARE - Hydrologist (Medical): No    Lack of Transportation (Non-Medical): No  Physical Activity: Insufficiently Active (07/30/2020)   Exercise Vital Sign    Days of Exercise per Week: 7 days    Minutes of Exercise per Session: 20 min  Stress: No Stress Concern  Present (07/30/2020)   [place]    Feeling of Stress : Not at all  Social Connections: Socially Isolated (07/30/2020)   Social Connection and Isolation Panel [NHANES]    Frequency of Communication with Friends and Family: Never    Frequency of Social Gatherings with Friends and Family: Once a week    Attends Religious Services: Never    Marine scientist or Organizations: No    Attends Archivist Meetings: Never    Marital Status: Never married  Intimate Partner Violence: Not At Risk (07/30/2020)   Humiliation, Afraid, Rape, and Kick questionnaire    Fear of Current or Ex-Partner: No    Emotionally Abused: No    Physically Abused: No    Sexually  Abused: No    FAMILY HISTORY:  Family History  Problem Relation Age of Onset   Diabetes Mother    Renal Disease Mother        failure   Diabetes Father        'old age'   Hypertension Sister    Hypertension Brother    Diabetes Brother    Diabetes Sister    Diabetes Sister    Diabetes Brother    Hypertension Brother    Diabetes Brother        right BKA   Kidney failure Brother        on dialysis   Kidney failure Brother        on dialysis   Diabetes Brother        bilateral BKA   Colon polyps Neg Hx    Colon cancer Neg Hx    Pancreatic disease Neg Hx     CURRENT MEDICATIONS:  Current Outpatient Medications  Medication Sig Dispense Refill   amLODipine (NORVASC) 10 MG tablet Take 10 mg by mouth daily.     atorvastatin (LIPITOR) 20 MG tablet Take 1 tablet (20 mg total) by mouth daily. 90 tablet 3   clindamycin (CLINDAGEL) 1 % gel Apply 1 application topically daily. Applies to back of neck     colchicine 0.6 MG tablet TAKE 1 TABLET BY MOUTH 3 TIMES DAILY FOR 5 DAYS FOR GOUT PAIN. 15 tablet 5   febuxostat (ULORIC) 40 MG tablet Take 1 tablet (40 mg total) by mouth daily. 90 tablet 5   HYDROcodone-acetaminophen (NORCO/VICODIN) 5-325 MG tablet One  tablet by mouth every six hours as needed for pain.  Must last 30 days. 54 tablet 0   iron polysaccharides (NIFEREX) 150 MG capsule Take 1 capsule (150 mg total) by mouth daily. 90 capsule 1   levothyroxine (SYNTHROID) 50 MCG tablet Take 50 mcg by mouth every morning.     levothyroxine (SYNTHROID, LEVOTHROID) 200 MCG tablet Take 1 tablet (200 mcg total) by mouth daily before breakfast. 90 tablet 3   metoprolol succinate (TOPROL-XL) 25 MG 24 hr tablet Take 1.5 tablets (37.5 mg total) by mouth daily. 45 tablet 6   omeprazole (PRILOSEC) 20 MG capsule TAKE 1 CAPSULE BY MOUTH ONCE DAILY. (Patient taking differently: Take 20 mg by mouth daily.) 30 capsule 3   ondansetron (ZOFRAN) 4 MG tablet Take 4 mg by mouth 3 (three) times daily as needed.     sildenafil (VIAGRA) 100 MG tablet Take 50 mg by mouth daily as needed.     tamsulosin (FLOMAX) 0.4 MG CAPS capsule Take 1 capsule (0.4 mg total) by mouth daily after supper. (Patient taking differently: Take 0.4 mg by mouth daily.) 30 capsule 3   triamcinolone ointment (KENALOG) 0.5 % Apply 1 application topically 2 (two) times daily. 30 g 0   Vitamin D, Cholecalciferol, 25 MCG (1000 UT) TABS Take 1 tablet by mouth daily.     No current facility-administered medications for this visit.    ALLERGIES:  Allergies  Allergen Reactions   Aspirin Other (See Comments)    REACTION: Diverticular Bleed    PHYSICAL EXAM:  Performance status (ECOG): 1 - Symptomatic but completely ambulatory  Vitals:   09/28/22 1055  BP: 121/79  Pulse: 72  Resp: 18  Temp: 97.8 F (36.6 C)  SpO2: 97%   Wt Readings from Last 3 Encounters:  09/28/22 229 lb 11.2 oz (104.2 kg)  07/02/22 234 lb (106.1 kg)  02/23/22 234 lb 8 oz (  106.4 kg)   Physical Exam Vitals reviewed.  Constitutional:      Appearance: Normal appearance.  Cardiovascular:     Rate and Rhythm: Normal rate and regular rhythm.     Pulses: Normal pulses.     Heart sounds: Normal heart sounds.  Pulmonary:      Effort: Pulmonary effort is normal.     Breath sounds: Normal breath sounds.  Abdominal:     Palpations: Abdomen is soft. There is no hepatomegaly, splenomegaly or mass.     Tenderness: There is no abdominal tenderness.  Musculoskeletal:     Right lower leg: No edema.     Left lower leg: No edema.  Neurological:     General: No focal deficit present.     Mental Status: He is alert and oriented to person, place, and time.  Psychiatric:        Mood and Affect: Mood normal.        Behavior: Behavior normal.      LABORATORY DATA:  I have reviewed the labs as listed.     Latest Ref Rng & Units 09/21/2022    9:16 AM 02/13/2022    9:31 AM 08/15/2021    9:31 AM  CBC  WBC 4.0 - 10.5 K/uL 5.4  6.6  5.3   Hemoglobin 13.0 - 17.0 g/dL 14.1  13.1  11.8   Hematocrit 39.0 - 52.0 % 42.5  39.8  34.9   Platelets 150 - 400 K/uL 127  131  122       Latest Ref Rng & Units 09/21/2022    9:16 AM 02/13/2022    9:31 AM 08/15/2021    9:31 AM  CMP  Glucose 70 - 99 mg/dL 116  108  103   BUN 8 - 23 mg/dL '13  24  12   '$ Creatinine 0.61 - 1.24 mg/dL 1.19  1.88  1.15   Sodium 135 - 145 mmol/L 133  136  136   Potassium 3.5 - 5.1 mmol/L 3.4  3.8  3.4   Chloride 98 - 111 mmol/L 99  100  101   CO2 22 - 32 mmol/L '26  25  27   '$ Calcium 8.9 - 10.3 mg/dL 9.2  9.3  8.9   Total Protein 6.5 - 8.1 g/dL 7.8  7.9  7.4   Total Bilirubin 0.3 - 1.2 mg/dL 1.1  0.8  1.2   Alkaline Phos 38 - 126 U/L 53  51  52   AST 15 - 41 U/L 73  76  42   ALT 0 - 44 U/L 43  35  24     DIAGNOSTIC IMAGING:  I have independently reviewed the scans and discussed with the patient. No results found.   ASSESSMENT:  Well- differentiated neuroendocrine carcinoid tumor: -Polypectomy by Dr. Oneida Alar on 08/14/2014 and 10/08/2015 with negative octreotide scan on 09/19/2014. -His case was presented at GI tumor board in Feb/March 2016 with recommendation for anatomic biopsies to guide role of surgical resection.  Anatomic biopsies in 03/2016 were  negative. -His last 2 chromogranin A levels have been elevated January 2020 it was 186.0.  August 2020 it was 180.4.  Dr. Oneida Alar is aware of elevated levels and has seen the patient in consult and advised EGD.  Patient has refused at this time.     2.  Pulmonary nodule: -6 mm LUL pulmonary nodule on neck PET scan done on 10/18/2018. -CT chest from September 2020 revealed stable left lung nodule. -Repeat CT chest from  07/11/2020 showed interval development of multiple patchy areas of groundglass attenuation scattered throughout both lungs.  Other nonspecific these are favored to represent sequelae of inflammation or infection.  Atypical viral infection not excluded.  This is likely related to recent COVID-19 diagnosis.  The 7 mm pulmonary nodule remains stable.  Thought to be a benign perifissural lymph node.   PLAN:  1. Well- differentiated neuroendocrine carcinoid tumor: - He does not have any signs or symptoms of carcinoid syndrome. - Reviewed labs from 09/21/2022.  LFTs other than AST have been normal.  Mild thrombocytopenia is also stable. - Serum chromogranin is mildly elevated but stable at 137. - RTC 6 months for follow-up with labs.  If stable at next visit, we will switch to once a year.   2.  Elevated LFTs: - Chronically elevated AST has been stable.  He drinks alcohol on the weekends.    Orders placed this encounter:  Orders Placed This Encounter  Procedures   CBC with Differential   Comprehensive metabolic panel   Chromogranin A      Derek Jack, MD [place] [phone]

## 2022-09-28 NOTE — Patient Instructions (Signed)
[place] at [facility] Discharge Instructions   You were seen and examined today by Dr. Delton Coombes.  He reviewed the results of your lab work which are normal/stable.   We will see you back in 6 months. We will repeat your blood work prior to this visit.   Thank you for choosing [place] at [facility] to provide your oncology and hematology care.  To afford each patient quality time with our provider, please arrive at least 15 minutes before your scheduled appointment time.   If you have a lab appointment with the [facility] please come in thru the Main Entrance and check in at the main information desk.  You need to re-schedule your appointment should you arrive 10 or more minutes late.  We strive to give you quality time with our providers, and arriving late affects you and other patients whose appointments are after yours.  Also, if you no show three or more times for appointments you may be dismissed from the clinic at the providers discretion.     Again, thank you for choosing [facility].  Our hope is that these requests will decrease the amount of time that you wait before being seen by our physicians.       _____________________________________________________________  Should you have questions after your visit to [facility], please contact our office at [phone] and follow the prompts.  Our office hours are 8:00 a.m. and 4:30 p.m. Monday - Friday.  Please note that voicemails left after 4:00 p.m. may not be returned until the following business day.  We are closed weekends and major holidays.  You do have access to a nurse 24-7, just call the main number to the clinic [phone] and do not press any options, hold on the line and a nurse will answer the phone.    For prescription refill requests, have your pharmacy contact our office and allow 72 hours.    Due to Covid, you will need to wear a mask  upon entering the hospital. If you do not have a mask, a mask will be given to you at the Main Entrance upon arrival. For doctor visits, patients may have 1 support person age 25 or older with them. For treatment visits, patients can not have anyone with them due to social distancing guidelines and our immunocompromised population.

## 2022-10-01 ENCOUNTER — Encounter: Payer: Self-pay | Admitting: Orthopaedic Surgery

## 2022-10-01 ENCOUNTER — Ambulatory Visit (INDEPENDENT_AMBULATORY_CARE_PROVIDER_SITE_OTHER): Payer: 59 | Admitting: Orthopaedic Surgery

## 2022-10-01 VITALS — BP 135/80 | HR 82 | Ht 75.0 in | Wt 233.0 lb

## 2022-10-01 DIAGNOSIS — M1A0411 Idiopathic chronic gout, right hand, with tophus (tophi): Secondary | ICD-10-CM | POA: Diagnosis not present

## 2022-10-01 NOTE — Progress Notes (Signed)
I feel ok.  His gout is under control.  He is taking his medicine.  He has deformity from tophus of the right dorsal wrist but has good motion.  No redness.  Encounter Diagnosis  Name Primary?   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  Call if any problem.  Precautions discussed.  Electronically Signed Sanjuana Kava, MD 2/1/20249:54 AM

## 2022-10-15 ENCOUNTER — Telehealth: Payer: Self-pay

## 2022-10-15 NOTE — Telephone Encounter (Signed)
Hydrocodone-Acetaminophen 5/325 MG Qty 54 Tablets  PATIENT USES [facility]

## 2022-10-19 MED ORDER — HYDROCODONE-ACETAMINOPHEN 5-325 MG PO TABS: ORAL_TABLET | ORAL | 0 refills | Status: DC

## 2022-10-19 NOTE — Telephone Encounter (Signed)
Request has been sent to the provider.

## 2022-10-21 NOTE — Telephone Encounter (Signed)
done 

## 2022-10-23 ENCOUNTER — Encounter: Payer: Self-pay | Admitting: Urology

## 2022-10-23 ENCOUNTER — Ambulatory Visit (INDEPENDENT_AMBULATORY_CARE_PROVIDER_SITE_OTHER): Payer: 59 | Admitting: Urology

## 2022-10-23 VITALS — BP 129/81 | HR 89

## 2022-10-23 DIAGNOSIS — N401 Enlarged prostate with lower urinary tract symptoms: Secondary | ICD-10-CM

## 2022-10-23 DIAGNOSIS — R972 Elevated prostate specific antigen [PSA]: Secondary | ICD-10-CM | POA: Diagnosis not present

## 2022-10-23 DIAGNOSIS — R351 Nocturia: Secondary | ICD-10-CM | POA: Diagnosis not present

## 2022-10-23 DIAGNOSIS — N5201 Erectile dysfunction due to arterial insufficiency: Secondary | ICD-10-CM

## 2022-10-23 LAB — URINALYSIS, ROUTINE W REFLEX MICROSCOPIC
Bilirubin, UA: NEGATIVE
Glucose, UA: NEGATIVE
Ketones, UA: NEGATIVE
Nitrite, UA: NEGATIVE
Protein,UA: NEGATIVE
RBC, UA: NEGATIVE
Specific Gravity, UA: 1.015 (ref 1.005–1.030)
Urobilinogen, Ur: 0.2 mg/dL (ref 0.2–1.0)
pH, UA: 5.5 (ref 5.0–7.5)

## 2022-10-23 LAB — MICROSCOPIC EXAMINATION: Bacteria, UA: NONE SEEN

## 2022-10-23 LAB — BLADDER SCAN AMB NON-IMAGING: Scan Result: 7

## 2022-10-23 MED ORDER — SILDENAFIL CITRATE 100 MG PO TABS: 50.0000 mg | ORAL_TABLET | Freq: Every day | ORAL | 5 refills | Status: DC | PRN

## 2022-10-23 NOTE — Patient Instructions (Signed)

## 2022-10-23 NOTE — Progress Notes (Signed)
10/23/2022 10:26 AM   [name] April 12, 1951 [id number]  Referring provider: Yves Dill, NP [address],  [place] [address]  Elevated PSA   HPI: [name] is a 72yo here for evaluation of elevated PSA. PSA was 5.1 this year. IPSS 20 QOl 2 on flomax 0.'4mg'$  daily. Urine stream strong. Nocturia No family hx of prostate cancer. No dysuria or hematuria. UA today normal. He uses sildenafil prn for erectile dysfunction   PMH: Past Medical History:  Diagnosis Date   Anemia    FeDA: GASTRIC POLYPS, B12; TCS 2008 EGD 2009, 2008-HB 11.1 MCV 83.6 CR 1.22, 2009 FERRITIN 102-22   B12 deficiency    Carcinoid tumor determined by biopsy of stomach 09/25/2014   Chronic knee pain    Diverticulosis of colon    Lower GI bleed 2008   Essential hypertension    GERD (gastroesophageal reflux disease)    Gout    Hepatomegaly    Hepatic steatosis   History of alcohol abuse    History of septic arthritis    Hypothyroidism    PUD    Substance abuse ([place])     Surgical History: Past Surgical History:  Procedure Laterality Date   BIOPSY  08/14/2014   Procedure: BIOPSY;  Surgeon: Danie Binder, MD;  Location: [facility];  Service: Endoscopy;;   BIOPSY  03/12/2015   Procedure: BIOPSY;  Surgeon: Danie Binder, MD;  Location: [facility];  Service: Endoscopy;;   BIOPSY  04/21/2016   Procedure: BIOPSY;  Surgeon: Danie Binder, MD;  [facility];  Service: Endoscopy;;  bx's of antrum, body of stomach, fundus, and cardia   CHOLECYSTECTOMY     COLONOSCOPY  2008 [facility] DJ   LGIB 2o to TICS, prep good   COLONOSCOPY WITH PROPOFOL N/A 08/14/2014   SLF: 1. Four large colon polyps removed. 2. The left colon is redundant 3. Moderate diverticulosis throughout teh entire examined colon   COLONOSCOPY WITH PROPOFOL N/A 11/06/2014   SLF: 8 small polyps removed. One large pedunculated polyp removed from the ascending colon, tubulovillous and tubular adenomas. Next colonoscopy  March 2019   COLONOSCOPY WITH PROPOFOL N/A 01/04/2018   Procedure: COLONOSCOPY WITH PROPOFOL;  Surgeon: Danie Binder, MD;  [facility];  Service: Endoscopy;  Laterality: N/A;  10:00am   ESOPHAGOGASTRODUODENOSCOPY  12/08/2007   [id number] gastric polyps seen in the cardia and body of the stomach/Normal esophagus without evidence of Barrett's, mass, erosion/Normal duodenal bulb and second portion of the duodenum. Benign bx.   ESOPHAGOGASTRODUODENOSCOPY (EGD) WITH PROPOFOL N/A 08/14/2014   SLF: 1. Heme postive stools due to gastric and colon polyps 2. Multiple gastric    ESOPHAGOGASTRODUODENOSCOPY (EGD) WITH PROPOFOL N/A 03/12/2015   SLF: Multiple gastric nodules seen in gasric body/antrum. 2. Non-erosive gastritis ( inflammation) was found in the gastric antrum.    ESOPHAGOGASTRODUODENOSCOPY (EGD) WITH PROPOFOL N/A 10/08/2015   Procedure: ESOPHAGOGASTRODUODENOSCOPY (EGD) WITH PROPOFOL;  Surgeon: Danie Binder, MD;  [facility];  Service: Endoscopy;  Laterality: N/A;  0945   ESOPHAGOGASTRODUODENOSCOPY (EGD) WITH PROPOFOL N/A 04/21/2016   Procedure: ESOPHAGOGASTRODUODENOSCOPY (EGD) WITH PROPOFOL;  Surgeon: Danie Binder, MD;  [facility];  Service: Endoscopy;  Laterality: N/A;  730    HAND SURGERY Right    fracture repair with plates   INGUINAL HERNIA REPAIR Left 11/09/2018   Procedure: HERNIA REPAIR INGUINAL ADULT WITH MESH;  Surgeon: Virl Cagey, MD;  Location: [facility];  Service: General;  Laterality: Left;   JOINT REPLACEMENT Bilateral    knees   POLYPECTOMY  08/14/2014   Procedure: POLYPECTOMY;  Surgeon: Danie Binder, MD;  Location: [facility];  Service: Endoscopy;;   POLYPECTOMY N/A 11/06/2014   Procedure: POLYPECTOMY;  Surgeon: Danie Binder, MD;  Location[facility];  Service: Endoscopy;  Laterality: N/A;  Transverse Colon x3, Ascending Colon x2, Descending Colon x3   REPLACEMENT TOTAL KNEE BILATERAL     UPPER GASTROINTESTINAL ENDOSCOPY  APR 2009    INFLAMED HYPERPLASTIC POLYPS, CHRONIC GASTRITIS    Home Medications:  Allergies as of 10/23/2022       Reactions   Aspirin Other (See Comments)   REACTION: Diverticular Bleed        Medication List        Accurate as of October 23, 2022 10:26 AM. If you have any questions, ask your nurse or doctor.          amLODipine 10 MG tablet Commonly known as: NORVASC Take 10 mg by mouth daily.   atorvastatin 20 MG tablet Commonly known as: LIPITOR Take 1 tablet (20 mg total) by mouth daily.   clindamycin 1 % gel Commonly known as: CLINDAGEL Apply 1 application topically daily. Applies to back of neck   colchicine 0.6 MG tablet TAKE 1 TABLET BY MOUTH 3 TIMES DAILY FOR 5 DAYS FOR GOUT PAIN.   febuxostat 40 MG tablet Commonly known as: Uloric Take 1 tablet (40 mg total) by mouth daily.   HYDROcodone-acetaminophen 5-325 MG tablet Commonly known as: NORCO/VICODIN One tablet by mouth every six hours as needed for pain.  Must last 30 days.   iron polysaccharides 150 MG capsule Commonly known as: NIFEREX Take 1 capsule (150 mg total) by mouth daily.   levothyroxine 200 MCG tablet Commonly known as: SYNTHROID Take 1 tablet (200 mcg total) by mouth daily before breakfast.   levothyroxine 50 MCG tablet Commonly known as: SYNTHROID Take 50 mcg by mouth every morning.   metoprolol succinate 25 MG 24 hr tablet Commonly known as: TOPROL-XL Take 1.5 tablets (37.5 mg total) by mouth daily.   omeprazole 20 MG capsule Commonly known as: PRILOSEC TAKE 1 CAPSULE BY MOUTH ONCE DAILY.   ondansetron 4 MG tablet Commonly known as: ZOFRAN Take 4 mg by mouth 3 (three) times daily as needed.   sildenafil 100 MG tablet Commonly known as: VIAGRA Take 50 mg by mouth daily as needed.   tamsulosin 0.4 MG Caps capsule Commonly known as: Flomax Take 1 capsule (0.4 mg total) by mouth daily after supper. What changed: when to take this   triamcinolone ointment 0.5 % Commonly known  as: KENALOG Apply 1 application topically 2 (two) times daily.   Vitamin D (Cholecalciferol) 25 MCG (1000 UT) Tabs Take 1 tablet by mouth daily.        Allergies:  Allergies  Allergen Reactions   Aspirin Other (See Comments)    REACTION: Diverticular Bleed    Family History: Family History  Problem Relation Age of Onset   Diabetes Mother    Renal Disease Mother        failure   Diabetes Father        'old age'   Hypertension Sister    Hypertension Brother    Diabetes Brother    Diabetes Sister    Diabetes Sister    Diabetes Brother    Hypertension Brother    Diabetes Brother        right BKA  Kidney failure Brother        on dialysis   Kidney failure Brother        on dialysis   Diabetes Brother        bilateral BKA   Colon polyps Neg Hx    Colon cancer Neg Hx    Pancreatic disease Neg Hx     Social History:  reports that he has never smoked. He has never used smokeless tobacco. He reports current alcohol use of about 2.0 standard drinks of alcohol per week. He reports that he does not use drugs.  ROS: All other review of systems were reviewed and are negative except what is noted above in HPI  Physical Exam: BP 129/81   Pulse 89   Constitutional:  Alert and oriented, No acute distress. HEENT: [place] AT, moist mucus membranes.  Trachea midline, no masses. Cardiovascular: No clubbing, cyanosis, or edema. Respiratory: Normal respiratory effort, no increased work of breathing. GI: Abdomen is soft, nontender, nondistended, no abdominal masses GU: No CVA tenderness. Circumcised phallus. No masses/lesions on penis, testis, scrotum. Prostate 40g smooth no nodules no induration.  Lymph: No cervical or inguinal lymphadenopathy. Skin: No rashes, bruises or suspicious lesions. Neurologic: Grossly intact, no focal deficits, moving all 4 extremities. Psychiatric: Normal mood and affect.  Laboratory Data: Lab Results  Component Value Date   WBC 5.4 09/21/2022   HGB  14.1 09/21/2022   HCT 42.5 09/21/2022   MCV 98.8 09/21/2022   PLT 127 (L) 09/21/2022    Lab Results  Component Value Date   CREATININE 1.19 09/21/2022    Lab Results  Component Value Date   PSA 2.23 11/30/2008   PSA 2.90 06/02/2007    No results found for: "TESTOSTERONE"  Lab Results  Component Value Date   HGBA1C 5.6 10/16/2016    Urinalysis    Component Value Date/Time   COLORURINE YELLOW 07/25/2020 0043   APPEARANCEUR CLEAR 07/25/2020 0043   LABSPEC 1.009 07/25/2020 0043   PHURINE 5.0 07/25/2020 0043   GLUCOSEU NEGATIVE 07/25/2020 0043   HGBUR NEGATIVE 07/25/2020 0043   HGBUR negative 05/15/2008 0830   BILIRUBINUR NEGATIVE 07/25/2020 0043   KETONESUR NEGATIVE 07/25/2020 0043   PROTEINUR NEGATIVE 07/25/2020 0043   UROBILINOGEN 0.2 09/11/2014 1454   NITRITE NEGATIVE 07/25/2020 0043   LEUKOCYTESUR TRACE (A) 07/25/2020 0043    Lab Results  Component Value Date   BACTERIA NONE SEEN 07/25/2020    Pertinent Imaging:  Results for orders placed during the hospital encounter of 12/13/05  DG Abd 1 View  Narrative Clinical Data: 72 year old feeling bad; vomiting. Fever. Comparison: 04/26/04 and CT 04/26/04. ABDOMEN - 1 VIEW: Surgical clips are seen in the right upper quadrant. Bowel gas pattern is non-obstructive. There is no evidence for free intraperitoneal air on the supine view. Degenerative changes are noted in the spine. No abnormal calcifications identified.  Impression No evidence for bowel obstruction.  Provider: Barbarann Ehlers  No results found for this or any previous visit.  No results found for this or any previous visit.  No results found for this or any previous visit.  Results for orders placed during the hospital encounter of 10/17/08  US Renal  Narrative Clinical Data: A R S/anemia.  Renal failure  RENAL/URINARY TRACT ULTRASOUND  Technique:  Complete ultrasound examination of the urinary tract was performed including evaluation of  the kidneys, renal collecting systems, and urinary bladder.  Comparison: 08/29/2008 ultrasound  Findings: The right and left kidneys measure 13.2 and 12.0 cm in  length, respectively.  Bilateral increased echogenicity of the renal cortex.  No renal mass, focal scarring, or hydronephrosis. Bladder unremarkable.  Prostate gland prominent.  IMPRESSION: Findings compatible with nonspecific renal medical disease.  No hydronephrosis.  Provider: Mertie Clause  No valid procedures specified. No results found for this or any previous visit.  No results found for this or any previous visit.   Assessment & Plan:    1. Elevated PSA -PSA in 3 months. We discussed prostate biopsy and the patient defers at this time - Urinalysis, Routine w reflex microscopic - BLADDER SCAN AMB NON-IMAGING  2. BPh with nocturia -continue flomax 0.'4mg'$   3. Erectile dysfunction -sildenafil '50mg'$  prn   No follow-ups on file.  Nicolette Bang, MD  [facility] [place]

## 2022-10-23 NOTE — Progress Notes (Signed)
post void residual=7

## 2022-11-16 ENCOUNTER — Telehealth: Payer: Self-pay | Admitting: Orthopaedic Surgery

## 2022-11-16 MED ORDER — HYDROCODONE-ACETAMINOPHEN 5-325 MG PO TABS: ORAL_TABLET | ORAL | 0 refills | Status: DC

## 2022-11-16 NOTE — Telephone Encounter (Signed)
Patient came in requesting refill on his pain medicine.  HYDROcodone-acetaminophen (NORCO/VICODIN) 5-325 MG tablet   Pharmacy:  [facility]

## 2022-12-02 ENCOUNTER — Telehealth: Payer: Self-pay | Admitting: Orthopaedic Surgery

## 2022-12-10 ENCOUNTER — Encounter: Payer: Self-pay | Admitting: *Deleted

## 2022-12-16 ENCOUNTER — Telehealth: Payer: Self-pay | Admitting: Orthopaedic Surgery

## 2022-12-16 NOTE — Telephone Encounter (Signed)
Want's refill on pain medicine   HYDROcodone-acetaminophen (NORCO/VICODIN) 5-325 MG tablet   Pharmacy:  [facility]    Patient number [phone]

## 2022-12-17 MED ORDER — HYDROCODONE-ACETAMINOPHEN 5-325 MG PO TABS: ORAL_TABLET | ORAL | 0 refills | Status: DC

## 2022-12-28 ENCOUNTER — Other Ambulatory Visit: Payer: 59

## 2022-12-28 DIAGNOSIS — R972 Elevated prostate specific antigen [PSA]: Secondary | ICD-10-CM

## 2022-12-29 ENCOUNTER — Ambulatory Visit: Payer: 59 | Admitting: Orthopaedic Surgery

## 2022-12-29 LAB — PSA, TOTAL AND FREE
PSA, Free Pct: 21.7 %
PSA, Free: 1.3 ng/mL
Prostate Specific Ag, Serum: 6 ng/mL — ABNORMAL HIGH (ref 0.0–4.0)

## 2023-01-05 ENCOUNTER — Ambulatory Visit (INDEPENDENT_AMBULATORY_CARE_PROVIDER_SITE_OTHER): Payer: 59 | Admitting: Orthopaedic Surgery

## 2023-01-05 ENCOUNTER — Encounter: Payer: Self-pay | Admitting: Orthopaedic Surgery

## 2023-01-05 VITALS — BP 139/80 | HR 74 | Ht 75.0 in | Wt 230.0 lb

## 2023-01-05 DIAGNOSIS — M1A0411 Idiopathic chronic gout, right hand, with tophus (tophi): Secondary | ICD-10-CM | POA: Diagnosis not present

## 2023-01-05 DIAGNOSIS — G894 Chronic pain syndrome: Secondary | ICD-10-CM | POA: Diagnosis not present

## 2023-01-05 NOTE — Progress Notes (Signed)
My wrist is not hurting  He has chronic gout of the right hand and wrist.  He has had no new attack.  He is taking his medicine.  He has no trauma.  Right wrist has some slight swelling but the fingers have no swelling today and full ROM.  NV intact.  Encounter Diagnoses  Name Primary?   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  Continue medicine.  Call if any problem.  Precautions discussed.  Electronically Signed Darreld Mclean, MD 5/7/20248:57 AM

## 2023-01-11 ENCOUNTER — Ambulatory Visit (INDEPENDENT_AMBULATORY_CARE_PROVIDER_SITE_OTHER): Payer: 59 | Admitting: Urology

## 2023-01-11 VITALS — BP 119/74 | HR 90

## 2023-01-11 DIAGNOSIS — R351 Nocturia: Secondary | ICD-10-CM

## 2023-01-11 DIAGNOSIS — R972 Elevated prostate specific antigen [PSA]: Secondary | ICD-10-CM | POA: Diagnosis not present

## 2023-01-11 DIAGNOSIS — N401 Enlarged prostate with lower urinary tract symptoms: Secondary | ICD-10-CM | POA: Diagnosis not present

## 2023-01-11 DIAGNOSIS — N138 Other obstructive and reflux uropathy: Secondary | ICD-10-CM

## 2023-01-11 MED ORDER — TAMSULOSIN HCL 0.4 MG PO CAPS: 0.4000 mg | ORAL_CAPSULE | Freq: Every day | ORAL | 3 refills | Status: DC

## 2023-01-11 MED ORDER — LEVOFLOXACIN 750 MG PO TABS: 750.0000 mg | ORAL_TABLET | Freq: Once | ORAL | 0 refills | Status: AC

## 2023-01-11 NOTE — Progress Notes (Unsigned)
01/11/2023 1:41 PM   [name] 02-13-1951 [id number]  Referring provider: [name], FNP [address] [place],  [place] [address]  No chief complaint on file.   HPI: PSA increased to 6.0 from 5.1 in 3 months. IPSS 15 QOL 3 on flomax 0.4mg  daily. Uirne stream improved. No straining to urinate. Nocturia 2-3x.    PMH: Past Medical History:  Diagnosis Date   Anemia    FeDA: GASTRIC POLYPS, B12; TCS 2008 EGD 2009, 2008-HB 11.1 MCV 83.6 CR 1.22, 2009 FERRITIN 102-22   B12 deficiency    Carcinoid tumor determined by biopsy of stomach 09/25/2014   Chronic knee pain    Diverticulosis of colon    Lower GI bleed 2008   Essential hypertension    GERD (gastroesophageal reflux disease)    Gout    Hepatomegaly    Hepatic steatosis   History of alcohol abuse    History of septic arthritis    Hypothyroidism    PUD    Substance abuse ([facility])     Surgical History: Past Surgical History:  Procedure Laterality Date   BIOPSY  08/14/2014   Procedure: BIOPSY;  Surgeon: West Bali, MD;  Location: [facility];  Service: Endoscopy;;   BIOPSY  03/12/2015   Procedure: BIOPSY;  Surgeon: West Bali, MD;  Location: [facility];  Service: Endoscopy;;   BIOPSY  04/21/2016   Procedure: BIOPSY;  Surgeon: West Bali, MD;  [facility];  Service: Endoscopy;;  bx's of antrum, body of stomach, fundus, and cardia   CHOLECYSTECTOMY     COLONOSCOPY  2008 [facility] DJ   LGIB 2o to TICS, prep good   COLONOSCOPY WITH PROPOFOL N/A 08/14/2014   SLF: 1. Four large colon polyps removed. 2. The left colon is redundant 3. Moderate diverticulosis throughout teh entire examined colon   COLONOSCOPY WITH PROPOFOL N/A 11/06/2014   SLF: 8 small polyps removed. One large pedunculated polyp removed from the ascending colon, tubulovillous and tubular adenomas. Next colonoscopy March 2019   COLONOSCOPY WITH PROPOFOL N/A 01/04/2018   Procedure: COLONOSCOPY WITH PROPOFOL;  Surgeon: West Bali, MD;   [facility];  Service: Endoscopy;  Laterality: N/A;  10:00am   ESOPHAGOGASTRODUODENOSCOPY  12/08/2007   AOZ:HYQM gastric polyps seen in the cardia and body of the stomach/Normal esophagus without evidence of Barrett's, mass, erosion/Normal duodenal bulb and second portion of the duodenum. Benign bx.   ESOPHAGOGASTRODUODENOSCOPY (EGD) WITH PROPOFOL N/A 08/14/2014   SLF: 1. Heme postive stools due to gastric and colon polyps 2. Multiple gastric    ESOPHAGOGASTRODUODENOSCOPY (EGD) WITH PROPOFOL N/A 03/12/2015   SLF: Multiple gastric nodules seen in gasric body/antrum. 2. Non-erosive gastritis ( inflammation) was found in the gastric antrum.    ESOPHAGOGASTRODUODENOSCOPY (EGD) WITH PROPOFOL N/A 10/08/2015   Procedure: ESOPHAGOGASTRODUODENOSCOPY (EGD) WITH PROPOFOL;  Surgeon: West Bali, MD;  [facility];  Service: Endoscopy;  Laterality: N/A;  0945   ESOPHAGOGASTRODUODENOSCOPY (EGD) WITH PROPOFOL N/A 04/21/2016   Procedure: ESOPHAGOGASTRODUODENOSCOPY (EGD) WITH PROPOFOL;  Surgeon: West Bali, MD;  [facility];  Service: Endoscopy;  Laterality: N/A;  730    HAND SURGERY Right    fracture repair with plates   INGUINAL HERNIA REPAIR Left 11/09/2018   Procedure: HERNIA REPAIR INGUINAL ADULT WITH MESH;  Surgeon: Lucretia Roers, MD;  Location: [facility];  Service: General;  Laterality: Left;   JOINT REPLACEMENT Bilateral    knees   POLYPECTOMY  08/14/2014  Procedure: POLYPECTOMY;  Surgeon: West Bali, MD;  Location: [facility];  Service: Endoscopy;;   POLYPECTOMY N/A 11/06/2014   Procedure: POLYPECTOMY;  Surgeon: West Bali, MD;  Location: [facility];  Service: Endoscopy;  Laterality: N/A;  Transverse Colon x3, Ascending Colon x2, Descending Colon x3   REPLACEMENT TOTAL KNEE BILATERAL     UPPER GASTROINTESTINAL ENDOSCOPY  APR 2009   INFLAMED HYPERPLASTIC POLYPS, CHRONIC GASTRITIS    Home Medications:  Allergies as of 01/11/2023       Reactions   Aspirin  Other (See Comments)   REACTION: Diverticular Bleed        Medication List        Accurate as of Jan 11, 2023  1:41 PM. If you have any questions, ask your nurse or doctor.          amLODipine 10 MG tablet Commonly known as: NORVASC Take 10 mg by mouth daily.   atorvastatin 20 MG tablet Commonly known as: LIPITOR Take 1 tablet (20 mg total) by mouth daily.   clindamycin 1 % gel Commonly known as: CLINDAGEL Apply 1 application topically daily. Applies to back of neck   colchicine 0.6 MG tablet TAKE 1 TABLET BY MOUTH 3 TIMES DAILY FOR 5 DAYS FOR GOUT PAIN.   febuxostat 40 MG tablet Commonly known as: Uloric Take 1 tablet (40 mg total) by mouth daily.   HYDROcodone-acetaminophen 5-325 MG tablet Commonly known as: NORCO/VICODIN One tablet by mouth every six hours as needed for pain.  Must last 30 days.   iron polysaccharides 150 MG capsule Commonly known as: NIFEREX Take 1 capsule (150 mg total) by mouth daily.   levothyroxine 200 MCG tablet Commonly known as: SYNTHROID Take 1 tablet (200 mcg total) by mouth daily before breakfast.   levothyroxine 50 MCG tablet Commonly known as: SYNTHROID Take 50 mcg by mouth every morning.   metoprolol succinate 25 MG 24 hr tablet Commonly known as: TOPROL-XL Take 1.5 tablets (37.5 mg total) by mouth daily.   omeprazole 20 MG capsule Commonly known as: PRILOSEC TAKE 1 CAPSULE BY MOUTH ONCE DAILY.   ondansetron 4 MG tablet Commonly known as: ZOFRAN Take 4 mg by mouth 3 (three) times daily as needed.   sildenafil 100 MG tablet Commonly known as: VIAGRA Take 0.5 tablets (50 mg total) by mouth daily as needed.   tamsulosin 0.4 MG Caps capsule Commonly known as: Flomax Take 1 capsule (0.4 mg total) by mouth daily after supper. What changed: when to take this   triamcinolone ointment 0.5 % Commonly known as: KENALOG Apply 1 application topically 2 (two) times daily.   Vitamin D (Cholecalciferol) 25 MCG (1000 UT)  Tabs Take 1 tablet by mouth daily.        Allergies:  Allergies  Allergen Reactions   Aspirin Other (See Comments)    REACTION: Diverticular Bleed    Family History: Family History  Problem Relation Age of Onset   Diabetes Mother    Renal Disease Mother        failure   Diabetes Father        'old age'   Hypertension Sister    Hypertension Brother    Diabetes Brother    Diabetes Sister    Diabetes Sister    Diabetes Brother    Hypertension Brother    Diabetes Brother        right BKA   Kidney failure Brother        on dialysis   Kidney  failure Brother        on dialysis   Diabetes Brother        bilateral BKA   Colon polyps Neg Hx    Colon cancer Neg Hx    Pancreatic disease Neg Hx     Social History:  reports that he has never smoked. He has never used smokeless tobacco. He reports current alcohol use of about 2.0 standard drinks of alcohol per week. He reports that he does not use drugs.  ROS: All other review of systems were reviewed and are negative except what is noted above in HPI  Physical Exam: BP 119/74   Pulse 90   Constitutional:  Alert and oriented, No acute distress. HEENT: [place] AT, moist mucus membranes.  Trachea midline, no masses. Cardiovascular: No clubbing, cyanosis, or edema. Respiratory: Normal respiratory effort, no increased work of breathing. GI: Abdomen is soft, nontender, nondistended, no abdominal masses GU: No CVA tenderness.  Lymph: No cervical or inguinal lymphadenopathy. Skin: No rashes, bruises or suspicious lesions. Neurologic: Grossly intact, no focal deficits, moving all 4 extremities. Psychiatric: Normal mood and affect.  Laboratory Data: Lab Results  Component Value Date   WBC 5.4 09/21/2022   HGB 14.1 09/21/2022   HCT 42.5 09/21/2022   MCV 98.8 09/21/2022   PLT 127 (L) 09/21/2022    Lab Results  Component Value Date   CREATININE 1.19 09/21/2022    Lab Results  Component Value Date   PSA 2.23 11/30/2008    PSA 2.90 06/02/2007    No results found for: "TESTOSTERONE"  Lab Results  Component Value Date   HGBA1C 5.6 10/16/2016    Urinalysis    Component Value Date/Time   COLORURINE YELLOW 07/25/2020 0043   APPEARANCEUR Clear 10/23/2022 0959   LABSPEC 1.009 07/25/2020 0043   PHURINE 5.0 07/25/2020 0043   GLUCOSEU Negative 10/23/2022 0959   HGBUR NEGATIVE 07/25/2020 0043   HGBUR negative 05/15/2008 0830   BILIRUBINUR Negative 10/23/2022 0959   KETONESUR NEGATIVE 07/25/2020 0043   PROTEINUR Negative 10/23/2022 0959   PROTEINUR NEGATIVE 07/25/2020 0043   UROBILINOGEN 0.2 09/11/2014 1454   NITRITE Negative 10/23/2022 0959   NITRITE NEGATIVE 07/25/2020 0043   LEUKOCYTESUR Trace (A) 10/23/2022 0959   LEUKOCYTESUR TRACE (A) 07/25/2020 0043    Lab Results  Component Value Date   LABMICR See below: 10/23/2022   WBCUA 0-5 10/23/2022   LABEPIT 0-10 10/23/2022   BACTERIA None seen 10/23/2022    Pertinent Imaging: *** Results for orders placed during the hospital encounter of 12/13/05  DG Abd 1 View  Narrative Clinical Data: 72 year old feeling bad; vomiting. Fever. Comparison: 04/26/04 and CT 04/26/04. ABDOMEN - 1 VIEW: Surgical clips are seen in the right upper quadrant. Bowel gas pattern is non-obstructive. There is no evidence for free intraperitoneal air on the supine view. Degenerative changes are noted in the spine. No abnormal calcifications identified.  Impression No evidence for bowel obstruction.  Provider: Maurice March  No results found for this or any previous visit.  No results found for this or any previous visit.  No results found for this or any previous visit.  Results for orders placed during the hospital encounter of 10/17/08  US Renal  Narrative Clinical Data: A R S/anemia.  Renal failure  RENAL/URINARY TRACT ULTRASOUND  Technique:  Complete ultrasound examination of the urinary tract was performed including evaluation of the kidneys, renal  collecting systems, and urinary bladder.  Comparison: 08/29/2008 ultrasound  Findings: The right and left kidneys measure  13.2 and 12.0 cm in length, respectively.  Bilateral increased echogenicity of the renal cortex.  No renal mass, focal scarring, or hydronephrosis. Bladder unremarkable.  Prostate gland prominent.  IMPRESSION: Findings compatible with nonspecific renal medical disease.  No hydronephrosis.  Provider: Vertell Novak  No valid procedures specified. No results found for this or any previous visit.  No results found for this or any previous visit.   Assessment & Plan:    1. Elevated PSA -The patient and I talked about etiologies of elevated PSA.  We discussed the possible relationship between elevated PSA, prostate cancer, BPH, prostatitis, and UTI.   Conservative treatment of elevated PSA with watchful waiting was discussed with the patient.  All questions were answered.        All of the risks and benefits along with alternatives to prostate biopsy were discussed with the patient.  The patient gave fully informed consent to proceed with a transrectal ultrasound guided biopsy of the prostate for the evaluation of their evated PSA.  Prostate biopsy instructions and antibiotics were given to the patient.  - Urinalysis, Routine w reflex microscopic  2. Benign prostatic hyperplasia with urinary obstruction Continue flomax 0.4mg  daily  3. Nocturia Flomax 0.4mg  daily    No follow-ups on file.  Wilkie Aye, MD  [facility] [place]

## 2023-01-11 NOTE — Patient Instructions (Addendum)
   Appointment Time: 12:15 pm Appointment Date: 02/17/23  Location: [facility] Radiology Department   Prostate Biopsy Instructions  Stop all aspirin or blood thinners (aspirin, plavix, coumadin, warfarin, motrin, ibuprofen, advil, aleve, naproxen, naprosyn) for 7 days prior to the procedure.  If you have any questions about stopping these medications, please contact your primary care physician or cardiologist.  Having a light meal prior to the procedure is recommended.  If you are diabetic or have low blood sugar please bring a small snack or glucose tablet.  A Fleets enema is needed to be purchased over the counter at a local pharmacy and used 2 hours before you scheduled appointment.  This can be purchased over the counter at any pharmacy.  Antibiotics will be administered in the clinic at the time of the procedure and 1 tablet has been sent to your pharmacy. Please take the antibiotic as prescribed.    Please bring someone with you to the procedure to drive you home if you are given a valium to take prior to your procedure.   If you have any questions or concerns, please feel free to call the office at [phone] or send a Mychart message.    Thank you, [facility]

## 2023-01-12 ENCOUNTER — Encounter: Payer: Self-pay | Admitting: Urology

## 2023-01-14 ENCOUNTER — Telehealth: Payer: Self-pay

## 2023-01-14 NOTE — Telephone Encounter (Signed)
[place] pt------Hydrocodone-Acetaminophen 5/325 MG  Qty 50 Tablets  PATIENT USES [facility]

## 2023-01-17 MED ORDER — HYDROCODONE-ACETAMINOPHEN 5-325 MG PO TABS: ORAL_TABLET | ORAL | 0 refills | Status: DC

## 2023-02-15 ENCOUNTER — Telehealth: Payer: Self-pay

## 2023-02-15 MED ORDER — HYDROCODONE-ACETAMINOPHEN 5-325 MG PO TABS: ORAL_TABLET | ORAL | 0 refills | Status: DC

## 2023-02-15 NOTE — Telephone Encounter (Signed)
Hydrocodone-Acetaminophen 5/325 MG  Qty 50 Tablets  PATIENT USES [facility]

## 2023-02-16 ENCOUNTER — Telehealth: Payer: Self-pay

## 2023-02-16 NOTE — Telephone Encounter (Signed)
Patient's caregiver Charyl Dancer) called to let the office know patient will not be able to come to biopsy scheduled for 02-17-2023.  He will need an earlier appointment.   Please advise.  Call back [phone]

## 2023-02-16 NOTE — Telephone Encounter (Signed)
Biopsy and biopsy discussion moved to a later date.  Patient needed to have caregiver present for both appointments

## 2023-02-17 ENCOUNTER — Ambulatory Visit (HOSPITAL_COMMUNITY): Payer: 59

## 2023-02-17 ENCOUNTER — Other Ambulatory Visit: Payer: 59 | Admitting: Urology

## 2023-03-03 ENCOUNTER — Ambulatory Visit: Payer: 59 | Admitting: Urology

## 2023-03-08 ENCOUNTER — Ambulatory Visit (HOSPITAL_BASED_OUTPATIENT_CLINIC_OR_DEPARTMENT_OTHER): Payer: 59 | Admitting: Urology

## 2023-03-08 ENCOUNTER — Encounter: Payer: Self-pay | Admitting: Urology

## 2023-03-08 ENCOUNTER — Other Ambulatory Visit: Payer: Self-pay | Admitting: Urology

## 2023-03-08 ENCOUNTER — Ambulatory Visit (HOSPITAL_COMMUNITY)
Admission: RE | Admit: 2023-03-08 | Discharge: 2023-03-08 | Disposition: A | Discharge status: Home / Self Care | Payer: 59 | Source: Ambulatory Visit | Attending: Urology | Admitting: Urology

## 2023-03-08 DIAGNOSIS — C61 Malignant neoplasm of prostate: Secondary | ICD-10-CM

## 2023-03-08 DIAGNOSIS — N411 Chronic prostatitis: Secondary | ICD-10-CM

## 2023-03-08 DIAGNOSIS — R972 Elevated prostate specific antigen [PSA]: Secondary | ICD-10-CM

## 2023-03-08 MED ORDER — CEFTRIAXONE SODIUM 1 G IJ SOLR
INTRAMUSCULAR | Status: AC
  Filled 2023-03-08: qty 10

## 2023-03-08 MED ORDER — STERILE WATER FOR INJECTION IJ SOLN
INTRAMUSCULAR | Status: AC
  Filled 2023-03-08: qty 10

## 2023-03-08 MED ORDER — CEFTRIAXONE SODIUM 1 G IJ SOLR
1.0000 g | INTRAMUSCULAR | Status: DC
  Administered 2023-03-08: 1 g via INTRAMUSCULAR

## 2023-03-08 MED ORDER — LIDOCAINE HCL (PF) 2 % IJ SOLN
INTRAMUSCULAR | Status: AC
  Filled 2023-03-08: qty 10

## 2023-03-08 MED ORDER — GENTAMICIN SULFATE 40 MG/ML IJ SOLN
INTRAMUSCULAR | Status: AC
  Filled 2023-03-08: qty 2

## 2023-03-08 NOTE — Progress Notes (Signed)
Pt. Brought to [place] [facility] ambulatory, procedure explained, consent obtained. Prepped on stretcher. 1 gm Rocephin admin without adverse event, tolerated well. Placed on Left sidelying. Prostate samples obtained without difficulty. Tolerated well. DC instructions given, explained.

## 2023-03-08 NOTE — Patient Instructions (Signed)
Transrectal Ultrasound-Guided Prostate Biopsy, Care After What can I expect after the procedure? After the procedure, it is common to have: Pain and discomfort near your butt (rectum), especially while sitting. Pink-colored pee (urine). This is due to small amounts of blood in your pee. A burning feeling while peeing. Blood in your poop (stool). Bleeding from your butt. Blood in your semen. Follow these instructions at home: Medicines Take over-the-counter and prescription medicines only as told by your doctor. If you were given a sedative during your procedure, do not drive or use machines until your doctor says that it is safe. A sedative is a medicine that helps you relax. If you were prescribed an antibiotic medicine, take it as told by your doctor. Do not stop taking it even if you start to feel better. Activity  Return to your normal activities when your doctor says that it is safe. Ask your doctor when it is okay for you to have sex. You may have to avoid lifting. Ask your doctor how much you can safely lift. General instructions  Drink enough water to keep your pee pale yellow. Watch your pee, poop, and semen for new bleeding or bleeding that gets worse. Keep all follow-up visits. Contact a doctor if: You have any of these: Blood clots in your pee or poop. Blood in your pee more than 2 weeks after the procedure. Blood in your semen more than 2 months after the procedure. New or worse bleeding in your pee, poop, or semen. Very bad belly pain. Your pee smells bad or unusual. You have trouble peeing. Your lower belly feels firm. You have problems getting an erection. You feel like you may vomit (are nauseous), or you vomit. Get help right away if: You have a fever or chills. You have bright red pee. You have very bad pain that does not get better with medicine. You cannot pee. Summary After this procedure, it is common to have pain and discomfort near your butt,  especially while sitting. You may have blood in your pee and poop. It is common to have blood in your semen. Get help right away if you have a fever or chills. This information is not intended to replace advice given to you by your health care provider. Make sure you discuss any questions you have with your health care provider. Document Revised: 02/10/2021 Document Reviewed: 02/10/2021 Elsevier Patient Education  2024 [employer]vier Inc.  

## 2023-03-08 NOTE — Progress Notes (Signed)
Prostate Biopsy Procedure   Informed consent was obtained after discussing risks/benefits of the procedure.  A time out was performed to ensure correct patient identity.  Pre-Procedure: - Last PSA Level:  Lab Results  Component Value Date   PSA 2.23 11/30/2008   PSA 2.90 06/02/2007   - Gentamicin given prophylactically - Levaquin 500 mg administered PO -Transrectal Ultrasound performed revealing a 66.9gm prostate -No significant hypoechoic or median lobe noted  Procedure: - Prostate block performed using 10 cc 1% lidocaine and biopsies taken from sextant areas, a total of 12 under ultrasound guidance.  Post-Procedure: - Patient tolerated the procedure well - He was counseled to seek immediate medical attention if experiences any severe pain, significant bleeding, or fevers - Return in one week to discuss biopsy results

## 2023-03-16 ENCOUNTER — Telehealth: Payer: Self-pay | Admitting: Orthopaedic Surgery

## 2023-03-16 NOTE — Telephone Encounter (Signed)
Dr. Hilda Lias,   Patient came in office to request refill on pain medicine    HYDROcodone-acetaminophen (NORCO/VICODIN) 5-325 MG tablet    Pharmacy:  [facility]

## 2023-03-17 ENCOUNTER — Telehealth: Payer: Self-pay | Admitting: Orthopaedic Surgery

## 2023-03-17 MED ORDER — HYDROCODONE-ACETAMINOPHEN 5-325 MG PO TABS: ORAL_TABLET | ORAL | 0 refills | Status: DC

## 2023-03-22 ENCOUNTER — Ambulatory Visit (INDEPENDENT_AMBULATORY_CARE_PROVIDER_SITE_OTHER): Payer: 59 | Admitting: Urology

## 2023-03-22 ENCOUNTER — Inpatient Hospital Stay: Payer: 59 | Attending: Hematology

## 2023-03-22 VITALS — BP 149/81 | HR 87 | Ht 75.0 in | Wt 230.0 lb

## 2023-03-22 DIAGNOSIS — D3A8 Other benign neuroendocrine tumors: Secondary | ICD-10-CM

## 2023-03-22 DIAGNOSIS — C61 Malignant neoplasm of prostate: Secondary | ICD-10-CM

## 2023-03-22 DIAGNOSIS — C7A092 Malignant carcinoid tumor of the stomach: Secondary | ICD-10-CM | POA: Diagnosis present

## 2023-03-22 LAB — CBC WITH DIFFERENTIAL/PLATELET
Abs Immature Granulocytes: 0.01 10*3/uL (ref 0.00–0.07)
Basophils Absolute: 0 10*3/uL (ref 0.0–0.1)
Basophils Relative: 1 %
Eosinophils Absolute: 0.4 10*3/uL (ref 0.0–0.5)
Eosinophils Relative: 6 %
HCT: 35.4 % — ABNORMAL LOW (ref 39.0–52.0)
Hemoglobin: 11.8 g/dL — ABNORMAL LOW (ref 13.0–17.0)
Immature Granulocytes: 0 %
Lymphocytes Relative: 46 %
Lymphs Abs: 2.7 10*3/uL (ref 0.7–4.0)
MCH: 32.9 pg (ref 26.0–34.0)
MCHC: 33.3 g/dL (ref 30.0–36.0)
MCV: 98.6 fL (ref 80.0–100.0)
Monocytes Absolute: 0.6 10*3/uL (ref 0.1–1.0)
Monocytes Relative: 11 %
Neutro Abs: 2.1 10*3/uL (ref 1.7–7.7)
Neutrophils Relative %: 36 %
Platelets: 106 10*3/uL — ABNORMAL LOW (ref 150–400)
RBC: 3.59 MIL/uL — ABNORMAL LOW (ref 4.22–5.81)
RDW: 14.2 % (ref 11.5–15.5)
WBC: 5.9 10*3/uL (ref 4.0–10.5)
nRBC: 0 % (ref 0.0–0.2)

## 2023-03-22 LAB — COMPREHENSIVE METABOLIC PANEL
ALT: 29 U/L (ref 0–44)
AST: 45 U/L — ABNORMAL HIGH (ref 15–41)
Albumin: 3.5 g/dL (ref 3.5–5.0)
Alkaline Phosphatase: 48 U/L (ref 38–126)
Anion gap: 5 (ref 5–15)
BUN: 16 mg/dL (ref 8–23)
CO2: 26 mmol/L (ref 22–32)
Calcium: 8.9 mg/dL (ref 8.9–10.3)
Chloride: 104 mmol/L (ref 98–111)
Creatinine, Ser: 1.26 mg/dL — ABNORMAL HIGH (ref 0.61–1.24)
GFR, Estimated: >60 mL/min (ref >60)
Glucose, Bld: 103 mg/dL — ABNORMAL HIGH (ref 70–99)
Potassium: 4.2 mmol/L (ref 3.5–5.1)
Sodium: 135 mmol/L (ref 135–145)
Total Bilirubin: 1.4 mg/dL — ABNORMAL HIGH (ref 0.3–1.2)
Total Protein: 7.5 g/dL (ref 6.5–8.1)

## 2023-03-22 NOTE — Progress Notes (Signed)
03/22/2023 12:17 PM   [name] 1950-10-26 [id number]  Referring provider: [name], FNP [address] [place],  [place] [address]  Followup prostate biopsy   HPI: Mr [name] is a 71yo here for followup after prostate biopsy. Biopsy revealed Gleason 3+4=7 in 1/12 cores. PSA 6.0.    PMH: Past Medical History:  Diagnosis Date   Anemia    FeDA: GASTRIC POLYPS, B12; TCS 2008 EGD 2009, 2008-HB 11.1 MCV 83.6 CR 1.22, 2009 FERRITIN 102-22   B12 deficiency    Carcinoid tumor determined by biopsy of stomach 09/25/2014   Chronic knee pain    Diverticulosis of colon    Lower GI bleed 2008   Essential hypertension    GERD (gastroesophageal reflux disease)    Gout    Hepatomegaly    Hepatic steatosis   History of alcohol abuse    History of septic arthritis    Hypothyroidism    PUD    Substance abuse (HCC)     Surgical History: Past Surgical History:  Procedure Laterality Date   BIOPSY  08/14/2014   Procedure: BIOPSY;  Surgeon: West Bali, MD;  Location: [facility];  Service: Endoscopy;;   BIOPSY  03/12/2015   Procedure: BIOPSY;  Surgeon: West Bali, MD;  Location: [facility];  Service: Endoscopy;;   BIOPSY  04/21/2016   Procedure: BIOPSY;  Surgeon: West Bali, MD;  [facility];  Service: Endoscopy;;  bx's of antrum, body of stomach, fundus, and cardia   CHOLECYSTECTOMY     COLONOSCOPY  2008 [facility] DJ   LGIB 2o to TICS, prep good   COLONOSCOPY WITH PROPOFOL N/A 08/14/2014   SLF: 1. Four large colon polyps removed. 2. The left colon is redundant 3. Moderate diverticulosis throughout teh entire examined colon   COLONOSCOPY WITH PROPOFOL N/A 11/06/2014   SLF: 8 small polyps removed. One large pedunculated polyp removed from the ascending colon, tubulovillous and tubular adenomas. Next colonoscopy March 2019   COLONOSCOPY WITH PROPOFOL N/A 01/04/2018   Procedure: COLONOSCOPY WITH PROPOFOL;  Surgeon: West Bali, MD;  [facility];   Service: Endoscopy;  Laterality: N/A;  10:00am   ESOPHAGOGASTRODUODENOSCOPY  12/08/2007   WUX:LKGM gastric polyps seen in the cardia and body of the stomach/Normal esophagus without evidence of Barrett's, mass, erosion/Normal duodenal bulb and second portion of the duodenum. Benign bx.   ESOPHAGOGASTRODUODENOSCOPY (EGD) WITH PROPOFOL N/A 08/14/2014   SLF: 1. Heme postive stools due to gastric and colon polyps 2. Multiple gastric    ESOPHAGOGASTRODUODENOSCOPY (EGD) WITH PROPOFOL N/A 03/12/2015   SLF: Multiple gastric nodules seen in gasric body/antrum. 2. Non-erosive gastritis ( inflammation) was found in the gastric antrum.    ESOPHAGOGASTRODUODENOSCOPY (EGD) WITH PROPOFOL N/A 10/08/2015   Procedure: ESOPHAGOGASTRODUODENOSCOPY (EGD) WITH PROPOFOL;  Surgeon: West Bali, MD;  [facility];  Service: Endoscopy;  Laterality: N/A;  0945   ESOPHAGOGASTRODUODENOSCOPY (EGD) WITH PROPOFOL N/A 04/21/2016   Procedure: ESOPHAGOGASTRODUODENOSCOPY (EGD) WITH PROPOFOL;  Surgeon: West Bali, MD;  [facility];  Service: Endoscopy;  Laterality: N/A;  730    HAND SURGERY Right    fracture repair with plates   INGUINAL HERNIA REPAIR Left 11/09/2018   Procedure: HERNIA REPAIR INGUINAL ADULT WITH MESH;  Surgeon: Lucretia Roers, MD;  Location: [facility];  Service: General;  Laterality: Left;   JOINT REPLACEMENT Bilateral    knees   POLYPECTOMY  08/14/2014   Procedure: POLYPECTOMY;  Surgeon: Darleene Cleaver  Fields, MD;  Location: [facility];  Service: Endoscopy;;   POLYPECTOMY N/A 11/06/2014   Procedure: POLYPECTOMY;  Surgeon: West Bali, MD;  Location: [facility];  Service: Endoscopy;  Laterality: N/A;  Transverse Colon x3, Ascending Colon x2, Descending Colon x3   REPLACEMENT TOTAL KNEE BILATERAL     UPPER GASTROINTESTINAL ENDOSCOPY  APR 2009   INFLAMED HYPERPLASTIC POLYPS, CHRONIC GASTRITIS    Home Medications:  Allergies as of 03/22/2023       Reactions   Aspirin Other (See Comments)    REACTION: Diverticular Bleed        Medication List        Accurate as of March 22, 2023 12:17 PM. If you have any questions, ask your nurse or doctor.          amLODipine 10 MG tablet Commonly known as: NORVASC Take 10 mg by mouth daily.   atorvastatin 20 MG tablet Commonly known as: LIPITOR Take 1 tablet (20 mg total) by mouth daily.   clindamycin 1 % gel Commonly known as: CLINDAGEL Apply 1 application topically daily. Applies to back of neck   colchicine 0.6 MG tablet TAKE 1 TABLET BY MOUTH 3 TIMES DAILY FOR 5 DAYS FOR GOUT PAIN.   febuxostat 40 MG tablet Commonly known as: Uloric Take 1 tablet (40 mg total) by mouth daily.   HYDROcodone-acetaminophen 5-325 MG tablet Commonly known as: NORCO/VICODIN One tablet by mouth every six hours as needed for pain.  Must last 30 days.   iron polysaccharides 150 MG capsule Commonly known as: NIFEREX Take 1 capsule (150 mg total) by mouth daily.   levothyroxine 200 MCG tablet Commonly known as: SYNTHROID Take 1 tablet (200 mcg total) by mouth daily before breakfast.   levothyroxine 50 MCG tablet Commonly known as: SYNTHROID Take 50 mcg by mouth every morning.   metoprolol succinate 25 MG 24 hr tablet Commonly known as: TOPROL-XL Take 1.5 tablets (37.5 mg total) by mouth daily.   omeprazole 20 MG capsule Commonly known as: PRILOSEC TAKE 1 CAPSULE BY MOUTH ONCE DAILY.   ondansetron 4 MG tablet Commonly known as: ZOFRAN Take 4 mg by mouth 3 (three) times daily as needed.   sildenafil 100 MG tablet Commonly known as: VIAGRA Take 0.5 tablets (50 mg total) by mouth daily as needed.   tamsulosin 0.4 MG Caps capsule Commonly known as: Flomax Take 1 capsule (0.4 mg total) by mouth daily.   triamcinolone ointment 0.5 % Commonly known as: KENALOG Apply 1 application topically 2 (two) times daily.   Vitamin D (Cholecalciferol) 25 MCG (1000 UT) Tabs Take 1 tablet by mouth daily.        Allergies:  Allergies   Allergen Reactions   Aspirin Other (See Comments)    REACTION: Diverticular Bleed    Family History: Family History  Problem Relation Age of Onset   Diabetes Mother    Renal Disease Mother        failure   Diabetes Father        'old age'   Hypertension Sister    Hypertension Brother    Diabetes Brother    Diabetes Sister    Diabetes Sister    Diabetes Brother    Hypertension Brother    Diabetes Brother        right BKA   Kidney failure Brother        on dialysis   Kidney failure Brother        on dialysis   Diabetes Brother  bilateral BKA   Colon polyps Neg Hx    Colon cancer Neg Hx    Pancreatic disease Neg Hx     Social History:  reports that he has never smoked. He has never used smokeless tobacco. He reports current alcohol use of about 2.0 standard drinks of alcohol per week. He reports that he does not use drugs.  ROS: All other review of systems were reviewed and are negative except what is noted above in HPI  Physical Exam: BP (!) 149/81   Pulse 87   Ht 6\' 3"  (1.905 m)   Wt 230 lb (104.3 kg)   BMI 28.75 kg/m   Constitutional:  Alert and oriented, No acute distress. HEENT: [place] AT, moist mucus membranes.  Trachea midline, no masses. Cardiovascular: No clubbing, cyanosis, or edema. Respiratory: Normal respiratory effort, no increased work of breathing. GI: Abdomen is soft, nontender, nondistended, no abdominal masses GU: No CVA tenderness.  Lymph: No cervical or inguinal lymphadenopathy. Skin: No rashes, bruises or suspicious lesions. Neurologic: Grossly intact, no focal deficits, moving all 4 extremities. Psychiatric: Normal mood and affect.  Laboratory Data: Lab Results  Component Value Date   WBC 5.9 03/22/2023   HGB 11.8 (L) 03/22/2023   HCT 35.4 (L) 03/22/2023   MCV 98.6 03/22/2023   PLT 106 (L) 03/22/2023    Lab Results  Component Value Date   CREATININE 1.26 (H) 03/22/2023    Lab Results  Component Value Date   PSA 2.23  11/30/2008   PSA 2.90 06/02/2007    No results found for: "TESTOSTERONE"  Lab Results  Component Value Date   HGBA1C 5.6 10/16/2016    Urinalysis    Component Value Date/Time   COLORURINE YELLOW 07/25/2020 0043   APPEARANCEUR Clear 10/23/2022 0959   LABSPEC 1.009 07/25/2020 0043   PHURINE 5.0 07/25/2020 0043   GLUCOSEU Negative 10/23/2022 0959   HGBUR NEGATIVE 07/25/2020 0043   HGBUR negative 05/15/2008 0830   BILIRUBINUR Negative 10/23/2022 0959   KETONESUR NEGATIVE 07/25/2020 0043   PROTEINUR Negative 10/23/2022 0959   PROTEINUR NEGATIVE 07/25/2020 0043   UROBILINOGEN 0.2 09/11/2014 1454   NITRITE Negative 10/23/2022 0959   NITRITE NEGATIVE 07/25/2020 0043   LEUKOCYTESUR Trace (A) 10/23/2022 0959   LEUKOCYTESUR TRACE (A) 07/25/2020 0043    Lab Results  Component Value Date   LABMICR See below: 10/23/2022   WBCUA 0-5 10/23/2022   LABEPIT 0-10 10/23/2022   BACTERIA None seen 10/23/2022    Pertinent Imaging:  Results for orders placed during the hospital encounter of 12/13/05  DG Abd 1 View  Narrative Clinical Data: 72 year old feeling bad; vomiting. Fever. Comparison: 04/26/04 and CT 04/26/04. ABDOMEN - 1 VIEW: Surgical clips are seen in the right upper quadrant. Bowel gas pattern is non-obstructive. There is no evidence for free intraperitoneal air on the supine view. Degenerative changes are noted in the spine. No abnormal calcifications identified.  Impression No evidence for bowel obstruction.  Provider: Maurice March  No results found for this or any previous visit.  No results found for this or any previous visit.  No results found for this or any previous visit.  Results for orders placed during the hospital encounter of 10/17/08  US Renal  Narrative Clinical Data: A R S/anemia.  Renal failure  RENAL/URINARY TRACT ULTRASOUND  Technique:  Complete ultrasound examination of the urinary tract was performed including evaluation of the kidneys,  renal collecting systems, and urinary bladder.  Comparison: 08/29/2008 ultrasound  Findings: The right and left kidneys  measure 13.2 and 12.0 cm in length, respectively.  Bilateral increased echogenicity of the renal cortex.  No renal mass, focal scarring, or hydronephrosis. Bladder unremarkable.  Prostate gland prominent.  IMPRESSION: Findings compatible with nonspecific renal medical disease.  No hydronephrosis.  Provider: Vertell Novak  No valid procedures specified. No results found for this or any previous visit.  No results found for this or any previous visit.   Assessment & Plan:    1. Prostate cancer [facility]) I discussed the natural history of favorable intermediate risk prostate cancer with the patient and the various treatment options including active surveillance, RALP, IMRT, brachytherapy, cryotherapy, HIFU and ADT. After discussing the options the patient elects for surveillance. He wishes to followup in 3 months with a PSA    No follow-ups on file.  Wilkie Aye, MD  [facility] [place]

## 2023-03-23 ENCOUNTER — Telehealth: Payer: Self-pay | Admitting: Radiation Oncology

## 2023-03-23 NOTE — Telephone Encounter (Signed)
Called patient to schedule a consultation w. Dr. Kathrynn Running. Patient stated he could not hear clearly over the phone and would need to call us back.

## 2023-03-24 LAB — CHROMOGRANIN A: Chromogranin A (ng/mL): 201.2 ng/mL — ABNORMAL HIGH (ref 0.0–101.8)

## 2023-03-28 NOTE — Progress Notes (Signed)
[facility] [address] [place] [address]    Clinic Day:  03/28/2023  Referring physician: [name], FNP  Patient Care Team: [name], FNP as PCP - General (Family Medicine) Jonelle Sidle, MD as PCP - Cardiology (Cardiology) West Bali, MD (Inactive) (Gastroenterology)   ASSESSMENT & PLAN:   Assessment:  Well- differentiated neuroendocrine carcinoid tumor: -Polypectomy by Dr. Darrick Penna on 08/14/2014 and 10/08/2015 with negative octreotide scan on 09/19/2014. -His case was presented at GI tumor board in Feb/March 2016 with recommendation for anatomic biopsies to guide role of surgical resection.  Anatomic biopsies in 03/2016 were negative. -His last 2 chromogranin A levels have been elevated January 2020 it was 186.0.  August 2020 it was 180.4.  Dr. Darrick Penna is aware of elevated levels and has seen the patient in consult and advised EGD.  Patient has refused at this time.   2.  Pulmonary nodule: -6 mm LUL pulmonary nodule on neck PET scan done on 10/18/2018. -CT chest from September 2020 revealed stable left lung nodule. -Repeat CT chest from 07/11/2020 showed interval development of multiple patchy areas of groundglass attenuation scattered throughout both lungs.  Other nonspecific these are favored to represent sequelae of inflammation or infection.  Atypical viral infection not excluded.  This is likely related to recent COVID-19 diagnosis.  The 7 mm pulmonary nodule remains stable.  Thought to be a benign perifissural lymph node.  Plan:  1. Well- differentiated neuroendocrine carcinoid tumor: - He does not have any signs or symptoms of carcinoid syndrome. - Reviewed labs from 09/21/2022.  LFTs other than AST have been normal.  Mild thrombocytopenia is also stable. - Serum chromogranin is mildly elevated but stable at 137. - RTC 6 months for follow-up with labs.  If stable at next visit, we will switch to once a year.   2.  Elevated  LFTs: - Chronically elevated AST has been stable.  He drinks alcohol on the weekends.  No orders of the defined types were placed in this encounter.     [name] Teague,acting as a Neurosurgeon for Doreatha Massed, MD.,have documented all relevant documentation on the behalf of Doreatha Massed, MD,as directed by  Doreatha Massed, MD while in the presence of Doreatha Massed, MD.   ***  [place]   7/28/202411:05 PM  CHIEF COMPLAINT:   Diagnosis: Neuroendocrine tumor   Cancer Staging  No matching staging information was found for the patient.  Current Therapy:  surveillance   HISTORY OF PRESENT ILLNESS:   Oncology History   No history exists.     INTERVAL HISTORY:   [name] is a 72 y.o. male presenting to clinic today for follow up of Neuroendocrine tumor. He was last seen by me on 09/28/22.  Patient had a prostate biopsy done on 7/8 and pathology revealed in the lateral right base: prostatic adenocarcinoma, Gleason score 3+4=7 (Grade Group 2), which involves 5% of one care, with a cancer length of 0.07 cm.   Today, he states that he is doing well overall. His appetite level is at ***%. His energy level is at ***%.  PAST MEDICAL HISTORY:   Past Medical History: Past Medical History:  Diagnosis Date   Anemia    FeDA: GASTRIC POLYPS, B12; TCS 2008 EGD 2009, 2008-HB 11.1 MCV 83.6 CR 1.22, 2009 FERRITIN 102-22   B12 deficiency    Carcinoid tumor determined by biopsy of stomach 09/25/2014   Chronic knee pain    Diverticulosis of colon  Lower GI bleed 2008   Essential hypertension    GERD (gastroesophageal reflux disease)    Gout    Hepatomegaly    Hepatic steatosis   History of alcohol abuse    History of septic arthritis    Hypothyroidism    PUD    Substance abuse (HCC)     Surgical History: Past Surgical History:  Procedure Laterality Date   BIOPSY  08/14/2014   Procedure: BIOPSY;  Surgeon: West Bali, MD;  Location: [facility];   Service: Endoscopy;;   BIOPSY  03/12/2015   Procedure: BIOPSY;  Surgeon: West Bali, MD;  Location: [facility];  Service: Endoscopy;;   BIOPSY  04/21/2016   Procedure: BIOPSY;  Surgeon: West Bali, MD;  [facility]: [facility];  Service: Endoscopy;;  bx's of antrum, body of stomach, fundus, and cardia   CHOLECYSTECTOMY     COLONOSCOPY  2008 [facility] DJ   LGIB 2o to TICS, prep good   COLONOSCOPY WITH PROPOFOL N/A 08/14/2014   SLF: 1. Four large colon polyps removed. 2. The left colon is redundant 3. Moderate diverticulosis throughout teh entire examined colon   COLONOSCOPY WITH PROPOFOL N/A 11/06/2014   SLF: 8 small polyps removed. One large pedunculated polyp removed from the ascending colon, tubulovillous and tubular adenomas. Next colonoscopy March 2019   COLONOSCOPY WITH PROPOFOL N/A 01/04/2018   Procedure: COLONOSCOPY WITH PROPOFOL;  Surgeon: West Bali, MD;  [facility];  Service: Endoscopy;  Laterality: N/A;  10:00am   ESOPHAGOGASTRODUODENOSCOPY  12/08/2007   ZOX:WRUE gastric polyps seen in the cardia and body of the stomach/Normal esophagus without evidence of Barrett's, mass, erosion/Normal duodenal bulb and second portion of the duodenum. Benign bx.   ESOPHAGOGASTRODUODENOSCOPY (EGD) WITH PROPOFOL N/A 08/14/2014   SLF: 1. Heme postive stools due to gastric and colon polyps 2. Multiple gastric    ESOPHAGOGASTRODUODENOSCOPY (EGD) WITH PROPOFOL N/A 03/12/2015   SLF: Multiple gastric nodules seen in gasric body/antrum. 2. Non-erosive gastritis ( inflammation) was found in the gastric antrum.    ESOPHAGOGASTRODUODENOSCOPY (EGD) WITH PROPOFOL N/A 10/08/2015   Procedure: ESOPHAGOGASTRODUODENOSCOPY (EGD) WITH PROPOFOL;  Surgeon: West Bali, MD;  [facility];  Service: Endoscopy;  Laterality: N/A;  0945   ESOPHAGOGASTRODUODENOSCOPY (EGD) WITH PROPOFOL N/A 04/21/2016   Procedure: ESOPHAGOGASTRODUODENOSCOPY (EGD) WITH PROPOFOL;  Surgeon: West Bali, MD;  Location: [facility];  Service: Endoscopy;  Laterality: N/A;  730    HAND SURGERY Right    fracture repair with plates   INGUINAL HERNIA REPAIR Left 11/09/2018   Procedure: HERNIA REPAIR INGUINAL ADULT WITH MESH;  Surgeon: Lucretia Roers, MD;  Location: [facility];  Service: General;  Laterality: Left;   JOINT REPLACEMENT Bilateral    knees   POLYPECTOMY  08/14/2014   Procedure: POLYPECTOMY;  Surgeon: West Bali, MD;  Location: [facility];  Service: Endoscopy;;   POLYPECTOMY N/A 11/06/2014   Procedure: POLYPECTOMY;  Surgeon: West Bali, MD;  Location: [facility];  Service: Endoscopy;  Laterality: N/A;  Transverse Colon x3, Ascending Colon x2, Descending Colon x3   REPLACEMENT TOTAL KNEE BILATERAL     UPPER GASTROINTESTINAL ENDOSCOPY  AP[facility] 2009   INFLAMED HYPERPLASTIC POLYPS, CHRONIC GASTRITIS    Social History: Social History   Socioeconomic History   Marital status: Single    Spouse name: Not on file   Number of children: 0   Years of education: 3   Highest education level: Not on file  Occupational History  Occupation: retired    Comment: farming  Tobacco Use   Smoking status: Never   Smokeless tobacco: Never   Tobacco comments:    Quit x 10 years/ never smoked on regular basis  Vaping Use   Vaping status: Never Used  Substance and Sexual Activity   Alcohol use: Yes    Alcohol/week: 2.0 standard drinks of alcohol    Types: 2 Shots of liquor per week    Comment: drinks on weekends, gin/vodka 1/5th.   Drug use: No   Sexual activity: Never  Other Topics Concern   Not on file  Social History Narrative   HE DOES NOT HAVE ANY CHILDREN.   Lives alone   Does not drive   CAN NOT READ   Social Determinants of Health   Financial Resource Strain: Low Risk  (07/30/2020)   Overall Financial Resource Strain (CARDIA)    Difficulty of Paying Living Expenses: Not hard at all  Food Insecurity: No Food Insecurity (07/30/2020)   Hunger Vital Sign    Worried About Running Out of Food in the  Last Year: Never true    Ran Out of Food in the Last Year: Never true  Transportation Needs: No Transportation Needs (07/30/2020)   PRAPARE - Administrator, Civil Service (Medical): No    Lack of Transportation (Non-Medical): No  Physical Activity: Insufficiently Active (07/30/2020)   Exercise Vital Sign    Days of Exercise per Week: 7 days    Minutes of Exercise per Session: 20 min  Stress: No Stress Concern Present (07/30/2020)   [employer] of Occupational Health - Occupational Stress Questionnaire    Feeling of Stress : Not at all  Social Connections: Socially Isolated (07/30/2020)   Social Connection and Isolation Panel [NHANES]    Frequency of Communication with Friends and Family: Never    Frequency of Social Gatherings with Friends and Family: Once a week    Attends Religious Services: Never    Database administrator or Organizations: No    Attends Banker Meetings: Never    Marital Status: Never married  Intimate Partner Violence: Not At Risk (07/30/2020)   Humiliation, Afraid, Rape, and Kick questionnaire    Fear of Current or Ex-Partner: No    Emotionally Abused: No    Physically Abused: No    Sexually Abused: No    Family History: Family History  Problem Relation Age of Onset   Diabetes Mother    Renal Disease Mother        failure   Diabetes Father        'old age'   Hypertension Sister    Hypertension Brother    Diabetes Brother    Diabetes Sister    Diabetes Sister    Diabetes Brother    Hypertension Brother    Diabetes Brother        right BKA   Kidney failure Brother        on dialysis   Kidney failure Brother        on dialysis   Diabetes Brother        bilateral BKA   Colon polyps Neg Hx    Colon cancer Neg Hx    Pancreatic disease Neg Hx     Current Medications:  Current Outpatient Medications:    amLODipine (NORVASC) 10 MG tablet, Take 10 mg by mouth daily., Disp: , Rfl:    atorvastatin (LIPITOR) 20 MG  tablet, Take 1 tablet (20 mg total)  by mouth daily., Disp: 90 tablet, Rfl: 3   clindamycin (CLINDAGEL) 1 % gel, Apply 1 application topically daily. Applies to back of neck, Disp: , Rfl:    colchicine 0.6 MG tablet, TAKE 1 TABLET BY MOUTH 3 TIMES DAILY FOR 5 DAYS FOR GOUT PAIN., Disp: 15 tablet, Rfl: 0   febuxostat (ULORIC) 40 MG tablet, Take 1 tablet (40 mg total) by mouth daily., Disp: 90 tablet, Rfl: 5   HYDROcodone-acetaminophen (NORCO/VICODIN) 5-325 MG tablet, One tablet by mouth every six hours as needed for pain.  Must last 30 days., Disp: 50 tablet, Rfl: 0   iron polysaccharides (NIFEREX) 150 MG capsule, Take 1 capsule (150 mg total) by mouth daily., Disp: 90 capsule, Rfl: 1   levothyroxine (SYNTHROID) 50 MCG tablet, Take 50 mcg by mouth every morning., Disp: , Rfl:    levothyroxine (SYNTHROID, LEVOTHROID) 200 MCG tablet, Take 1 tablet (200 mcg total) by mouth daily before breakfast., Disp: 90 tablet, Rfl: 3   metoprolol succinate (TOPROL-XL) 25 MG 24 hr tablet, Take 1.5 tablets (37.5 mg total) by mouth daily., Disp: 45 tablet, Rfl: 6   omeprazole (PRILOSEC) 20 MG capsule, TAKE 1 CAPSULE BY MOUTH ONCE DAILY. (Patient taking differently: Take 20 mg by mouth daily.), Disp: 30 capsule, Rfl: 3   ondansetron (ZOFRAN) 4 MG tablet, Take 4 mg by mouth 3 (three) times daily as needed., Disp: , Rfl:    sildenafil (VIAGRA) 100 MG tablet, Take 0.5 tablets (50 mg total) by mouth daily as needed., Disp: 10 tablet, Rfl: 5   tamsulosin (FLOMAX) 0.4 MG CAPS capsule, Take 1 capsule (0.4 mg total) by mouth daily., Disp: 90 capsule, Rfl: 3   triamcinolone ointment (KENALOG) 0.5 %, Apply 1 application topically 2 (two) times daily., Disp: 30 g, Rfl: 0   Vitamin D, Cholecalciferol, 25 MCG (1000 UT) TABS, Take 1 tablet by mouth daily., Disp: , Rfl:    Allergies: Allergies  Allergen Reactions   Aspirin Other (See Comments)    REACTION: Diverticular Bleed    REVIEW OF SYSTEMS:   Review of Systems   Constitutional:  Negative for chills, fatigue and fever.  HENT:   Negative for lump/mass, mouth sores, nosebleeds, sore throat and trouble swallowing.   Eyes:  Negative for eye problems.  Respiratory:  Negative for cough and shortness of breath.   Cardiovascular:  Negative for chest pain, leg swelling and palpitations.  Gastrointestinal:  Negative for abdominal pain, constipation, diarrhea, nausea and vomiting.  Genitourinary:  Negative for bladder incontinence, difficulty urinating, dysuria, frequency, hematuria and nocturia.   Musculoskeletal:  Negative for arthralgias, back pain, flank pain, myalgias and neck pain.  Skin:  Negative for itching and rash.  Neurological:  Negative for dizziness, headaches and numbness.  Hematological:  Does not bruise/bleed easily.  Psychiatric/Behavioral:  Negative for depression, sleep disturbance and suicidal ideas. The patient is not nervous/anxious.   All other systems reviewed and are negative.    VITALS:   There were no vitals taken for this visit.  Wt Readings from Last 3 Encounters:  03/22/23 230 lb (104.3 kg)  01/05/23 230 lb (104.3 kg)  10/01/22 233 lb (105.7 kg)    There is no height or weight on file to calculate BMI.  Performance status (ECOG): {CHL ONC [id number]  PHYSICAL EXAM:   Physical Exam Vitals and nursing note reviewed. Exam conducted with a chaperone present.  Constitutional:      Appearance: Normal appearance.  Cardiovascular:     Rate and Rhythm: Normal rate  and regular rhythm.     Pulses: Normal pulses.     Heart sounds: Normal heart sounds.  Pulmonary:     Effort: Pulmonary effort is normal.     Breath sounds: Normal breath sounds.  Abdominal:     Palpations: Abdomen is soft. There is no hepatomegaly, splenomegaly or mass.     Tenderness: There is no abdominal tenderness.  Musculoskeletal:     Right lower leg: No edema.     Left lower leg: No edema.  Lymphadenopathy:     Cervical: No cervical  adenopathy.     Right cervical: No superficial, deep or posterior cervical adenopathy.    Left cervical: No superficial, deep or posterior cervical adenopathy.     Upper Body:     Right upper body: No supraclavicular or axillary adenopathy.     Left upper body: No supraclavicular or axillary adenopathy.  Neurological:     General: No focal deficit present.     Mental Status: He is alert and oriented to person, place, and time.  Psychiatric:        Mood and Affect: Mood normal.        Behavior: Behavior normal.     LABS:      Latest Ref Rng & Units 03/22/2023   10:26 AM 09/21/2022    9:16 AM 02/13/2022    9:31 AM  CBC  WBC 4.0 - 10.5 K/uL 5.9  5.4  6.6   Hemoglobin 13.0 - 17.0 g/dL 35.5  73.2  20.2   Hematocrit 39.0 - 52.0 % 35.4  42.5  39.8   Platelets 150 - 400 K/uL 106  127  131       Latest Ref Rng & Units 03/22/2023   10:26 AM 09/21/2022    9:16 AM 02/13/2022    9:31 AM  CMP  Glucose 70 - 99 mg/dL 542  706  237   BUN 8 - 23 mg/dL 16  13  24    Creatinine 0.61 - 1.24 mg/dL 6.28  3.15  1.76   Sodium 135 - 145 mmol/L 135  133  136   Potassium 3.5 - 5.1 mmol/L 4.2  3.4  3.8   Chloride 98 - 111 mmol/L 104  99  100   CO2 22 - 32 mmol/L 26  26  25    Calcium 8.9 - 10.3 mg/dL 8.9  9.2  9.3   Total Protein 6.5 - 8.1 g/dL 7.5  7.8  7.9   Total Bilirubin 0.3 - 1.2 mg/dL 1.4  1.1  0.8   Alkaline Phos 38 - 126 U/L 48  53  51   AST 15 - 41 U/L 45  73  76   ALT 0 - 44 U/L 29  43  35      No results found for: "CEA1", "CEA" / No results found for: "CEA1", "CEA" Lab Results  Component Value Date   PSA1 6.0 (H) 12/28/2022   No results found for: "HYW737" No results found for: "CAN125"  Lab Results  Component Value Date   TOTALPROTELP 7.5 07/05/2014   ALBUMINELP 47.7 (L) 07/05/2014   A1GS 3.9 07/05/2014   A2GS 8.3 07/05/2014   BETS 8.4 (H) 07/05/2014   BETA2SER 22.2 (H) 07/05/2014   GAMS 9.5 (L) 07/05/2014   MSPIKE NOT DET 07/05/2014   SPEI SEE NOTE 07/05/2014   Lab  Results  Component Value Date   TIBC 271 11/17/2019   TIBC 347 09/22/2017   TIBC 262 03/22/2017   FERRITIN 99  11/17/2019   FERRITIN 283 04/24/2019   FERRITIN 237 09/27/2018   IRONPCTSAT 27 11/17/2019   IRONPCTSAT 28 09/22/2017   IRONPCTSAT 21 03/22/2017   Lab Results  Component Value Date   LDH 124 02/13/2022   LDH 141 08/15/2021   LDH 179 02/03/2021     STUDIES:   [place] PROSTATE BIOPSY MULTIPLE  Result Date: 03/08/2023 Please see Notes tab for imaging impression.  US Guided Needle Placement  Result Date: 03/08/2023 CLINICAL DATA:  Ultrasound was provided for use by the ordering physician.  No provider Interpretation or professional fees incurred.    [place] Transrectal Complete  Result Date: 03/08/2023 Please see Notes tab for imaging impression.

## 2023-03-29 ENCOUNTER — Inpatient Hospital Stay (HOSPITAL_BASED_OUTPATIENT_CLINIC_OR_DEPARTMENT_OTHER): Payer: 59 | Admitting: Hematology

## 2023-03-29 DIAGNOSIS — C61 Malignant neoplasm of prostate: Secondary | ICD-10-CM | POA: Diagnosis not present

## 2023-03-29 DIAGNOSIS — D3A8 Other benign neuroendocrine tumors: Secondary | ICD-10-CM | POA: Diagnosis not present

## 2023-03-29 DIAGNOSIS — C7A092 Malignant carcinoid tumor of the stomach: Secondary | ICD-10-CM | POA: Diagnosis not present

## 2023-03-29 DIAGNOSIS — D508 Other iron deficiency anemias: Secondary | ICD-10-CM | POA: Diagnosis not present

## 2023-03-29 NOTE — Patient Instructions (Signed)
[place] at [facility] Discharge Instructions   You were seen and examined today by Dr. Delton Coombes.  He reviewed the results of your lab work which are normal/stable.   We will see you back in 6 months. We will repeat lab work prior to this visit.    Thank you for choosing [place] at [facility] to provide your oncology and hematology care.  To afford each patient quality time with our provider, please arrive at least 15 minutes before your scheduled appointment time.   If you have a lab appointment with the [facility] please come in thru the Main Entrance and check in at the main information desk.  You need to re-schedule your appointment should you arrive 10 or more minutes late.  We strive to give you quality time with our providers, and arriving late affects you and other patients whose appointments are after yours.  Also, if you no show three or more times for appointments you may be dismissed from the clinic at the providers discretion.     Again, thank you for choosing [facility].  Our hope is that these requests will decrease the amount of time that you wait before being seen by our physicians.       _____________________________________________________________  Should you have questions after your visit to [facility], please contact our office at [phone] and follow the prompts.  Our office hours are 8:00 a.m. and 4:30 p.m. Monday - Friday.  Please note that voicemails left after 4:00 p.m. may not be returned until the following business day.  We are closed weekends and major holidays.  You do have access to a nurse 24-7, just call the main number to the clinic [phone] and do not press any options, hold on the line and a nurse will answer the phone.    For prescription refill requests, have your pharmacy contact our office and allow 72 hours.    Due to Covid, you will need to wear a mask upon  entering the hospital. If you do not have a mask, a mask will be given to you at the Main Entrance upon arrival. For doctor visits, patients may have 1 support person age 59 or older with them. For treatment visits, patients can not have anyone with them due to social distancing guidelines and our immunocompromised population.

## 2023-03-30 ENCOUNTER — Encounter: Payer: Self-pay | Admitting: Urology

## 2023-03-30 NOTE — Patient Instructions (Signed)

## 2023-04-06 ENCOUNTER — Telehealth: Payer: Self-pay | Admitting: Orthopaedic Surgery

## 2023-04-07 ENCOUNTER — Encounter: Payer: Self-pay | Admitting: Orthopaedic Surgery

## 2023-04-07 ENCOUNTER — Ambulatory Visit (INDEPENDENT_AMBULATORY_CARE_PROVIDER_SITE_OTHER): Payer: 59 | Admitting: Orthopaedic Surgery

## 2023-04-07 VITALS — BP 122/83 | HR 80 | Ht 75.0 in | Wt 234.5 lb

## 2023-04-07 DIAGNOSIS — M1A0411 Idiopathic chronic gout, right hand, with tophus (tophi): Secondary | ICD-10-CM

## 2023-04-07 NOTE — Progress Notes (Signed)
My hand is better today.  He has not had a gout attack recently.  He has less pain.  He has no redness.  He is taking his medicine.  He has a tophus of gout on the right radial wrist.  NV intact. ROM full.  Fingers have no swelling.  Encounter Diagnosis  Name Primary?   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  I will see in three months.  Call if any problem.  Precautions discussed.  Electronically Signed Darreld Mclean, MD 8/7/20249:36 AM

## 2023-04-12 ENCOUNTER — Encounter: Payer: Self-pay | Admitting: Urology

## 2023-04-12 DIAGNOSIS — C61 Malignant neoplasm of prostate: Secondary | ICD-10-CM | POA: Insufficient documentation

## 2023-04-12 NOTE — Progress Notes (Signed)
Radiation Oncology         [phone] ________________________________  Initial Outpatient Consultation  Name: [name] MRN: [id number]  Date: 04/13/2023  DOB: Sep 16, 1950  CC:[name], FNP  McKenzie, [name], MD   REFERRING PHYSICIAN: Malen Gauze, MD  DIAGNOSIS: 72 y.o. gentleman with Stage T1c adenocarcinoma of the prostate with Gleason score of 3+4, and PSA of 6.  No diagnosis found.  HISTORY OF PRESENT ILLNESS: [name] is a 72 y.o. male with a diagnosis of prostate cancer. He is also followed by Dr. Ellin Saba for a history of neuroendocrine carcinoid tumor in 07/2014 and 10/2015. He was noted to have an elevated PSA of 5.1 by his primary care physician, Dr. Marland Kitchen  Accordingly, he was referred for evaluation in urology by Dr. Ronne Binning on 10/23/22,  digital rectal examination performed at that time showed no nodules or induration. A repeat PSA obtained three months later showed an increase to 6. The patient proceeded to transrectal ultrasound with 12 biopsies of the prostate on 03/08/23.  The prostate volume measured 66.9 cc.  Out of 12 core biopsies, only 1 was positive.  The maximum Gleason score was 3+4, and this was seen in right base lateral (small focus).  The patient reviewed the biopsy results with his urologist and he has kindly been referred today for discussion of potential radiation treatment options.   PREVIOUS RADIATION THERAPY: No  PAST MEDICAL HISTORY:  Past Medical History:  Diagnosis Date   Anemia    FeDA: GASTRIC POLYPS, B12; TCS 2008 EGD 2009, 2008-HB 11.1 MCV 83.6 CR 1.22, 2009 FERRITIN 102-22   B12 deficiency    Carcinoid tumor determined by biopsy of stomach 09/25/2014   Chronic knee pain    Diverticulosis of colon    Lower GI bleed 2008   Essential hypertension    GERD (gastroesophageal reflux disease)    Gout    Hepatomegaly    Hepatic steatosis   History of alcohol abuse    History of septic arthritis    Hypothyroidism    PUD     Substance abuse (HCC)       PAST SURGICAL HISTORY: Past Surgical History:  Procedure Laterality Date   BIOPSY  08/14/2014   Procedure: BIOPSY;  Surgeon: West Bali, MD;  Location: [facility];  Service: Endoscopy;;   BIOPSY  03/12/2015   Procedure: BIOPSY;  Surgeon: West Bali, MD;  Location: [facility];  Service: Endoscopy;;   BIOPSY  04/21/2016   Procedure: BIOPSY;  Surgeon: West Bali, MD;  [facility];  Service: Endoscopy;;  bx's of antrum, body of stomach, fundus, and cardia   CHOLECYSTECTOMY     COLONOSCOPY  2008 [facility] DJ   LGIB 2o to TICS, prep good   COLONOSCOPY WITH PROPOFOL N/A 08/14/2014   SLF: 1. Four large colon polyps removed. 2. The left colon is redundant 3. Moderate diverticulosis throughout teh entire examined colon   COLONOSCOPY WITH PROPOFOL N/A 11/06/2014   SLF: 8 small polyps removed. One large pedunculated polyp removed from the ascending colon, tubulovillous and tubular adenomas. Next colonoscopy March 2019   COLONOSCOPY WITH PROPOFOL N/A 01/04/2018   Procedure: COLONOSCOPY WITH PROPOFOL;  Surgeon: West Bali, MD;  [facility];  Service: Endoscopy;  Laterality: N/A;  10:00am   ESOPHAGOGASTRODUODENOSCOPY  12/08/2007   WUJ:WJXB gastric polyps seen in the cardia and body of the stomach/Normal esophagus without evidence of Barrett's, mass, erosion/Normal duodenal bulb and second portion of the duodenum.  Benign bx.   ESOPHAGOGASTRODUODENOSCOPY (EGD) WITH PROPOFOL N/A 08/14/2014   SLF: 1. Heme postive stools due to gastric and colon polyps 2. Multiple gastric    ESOPHAGOGASTRODUODENOSCOPY (EGD) WITH PROPOFOL N/A 03/12/2015   SLF: Multiple gastric nodules seen in gasric body/antrum. 2. Non-erosive gastritis ( inflammation) was found in the gastric antrum.    ESOPHAGOGASTRODUODENOSCOPY (EGD) WITH PROPOFOL N/A 10/08/2015   Procedure: ESOPHAGOGASTRODUODENOSCOPY (EGD) WITH PROPOFOL;  Surgeon: West Bali, MD;  [facility];  Service:  Endoscopy;  Laterality: N/A;  0945   ESOPHAGOGASTRODUODENOSCOPY (EGD) WITH PROPOFOL N/A 04/21/2016   Procedure: ESOPHAGOGASTRODUODENOSCOPY (EGD) WITH PROPOFOL;  Surgeon: West Bali, MD;  [facility];  Service: Endoscopy;  Laterality: N/A;  730    HAND SURGERY Right    fracture repair with plates   INGUINAL HERNIA REPAIR Left 11/09/2018   Procedure: HERNIA REPAIR INGUINAL ADULT WITH MESH;  Surgeon: Lucretia Roers, MD;  Location: [facility];  Service: General;  Laterality: Left;   JOINT REPLACEMENT Bilateral    knees   POLYPECTOMY  08/14/2014   Procedure: POLYPECTOMY;  Surgeon: West Bali, MD;  Location: [facility];  Service: Endoscopy;;   POLYPECTOMY N/A 11/06/2014   Procedure: POLYPECTOMY;  Surgeon: West Bali, MD;  Location: [facility];  Service: Endoscopy;  Laterality: N/A;  Transverse Colon x3, Ascending Colon x2, Descending Colon x3   REPLACEMENT TOTAL KNEE BILATERAL     UPPER GASTROINTESTINAL ENDOSCOPY  APR 2009   INFLAMED HYPERPLASTIC POLYPS, CHRONIC GASTRITIS    FAMILY HISTORY:  Family History  Problem Relation Age of Onset   Diabetes Mother    Renal Disease Mother        failure   Diabetes Father        'old age'   Hypertension Sister    Hypertension Brother    Diabetes Brother    Diabetes Sister    Diabetes Sister    Diabetes Brother    Hypertension Brother    Diabetes Brother        right BKA   Kidney failure Brother        on dialysis   Kidney failure Brother        on dialysis   Diabetes Brother        bilateral BKA   Colon polyps Neg Hx    Colon cancer Neg Hx    Pancreatic disease Neg Hx     SOCIAL HISTORY:  Social History   Socioeconomic History   Marital status: Single    Spouse name: Not on file   Number of children: 0   Years of education: 3   Highest education level: Not on file  Occupational History   Occupation: retired    Comment: farming  Tobacco Use   Smoking status: Never   Smokeless tobacco: Never   Tobacco comments:     Quit x 10 years/ never smoked on regular basis  Vaping Use   Vaping status: Never Used  Substance and Sexual Activity   Alcohol use: Yes    Alcohol/week: 2.0 standard drinks of alcohol    Types: 2 Shots of liquor per week    Comment: drinks on weekends, gin/vodka 1/5th.   Drug use: No   Sexual activity: Never  Other Topics Concern   Not on file  Social History Narrative   HE DOES NOT HAVE ANY CHILDREN.   Lives alone   Does not drive   CAN NOT READ   Social Determinants of Health  Financial Resource Strain: Low Risk  (07/30/2020)   Overall Financial Resource Strain (CARDIA)    Difficulty of Paying Living Expenses: Not hard at all  Food Insecurity: No Food Insecurity (07/30/2020)   Hunger Vital Sign    Worried About Running Out of Food in the Last Year: Never true    Ran Out of Food in the Last Year: Never true  Transportation Needs: No Transportation Needs (07/30/2020)   PRAPARE - Administrator, Civil Service (Medical): No    Lack of Transportation (Non-Medical): No  Physical Activity: Insufficiently Active (07/30/2020)   Exercise Vital Sign    Days of Exercise per Week: 7 days    Minutes of Exercise per Session: 20 min  Stress: No Stress Concern Present (07/30/2020)   [employer] of Occupational Health - Occupational Stress Questionnaire    Feeling of Stress : Not at all  Social Connections: Socially Isolated (07/30/2020)   Social Connection and Isolation Panel [NHANES]    Frequency of Communication with Friends and Family: Never    Frequency of Social Gatherings with Friends and Family: Once a week    Attends Religious Services: Never    Database administrator or Organizations: No    Attends Banker Meetings: Never    Marital Status: Never married  Intimate Partner Violence: Not At Risk (07/30/2020)   Humiliation, Afraid, Rape, and Kick questionnaire    Fear of Current or Ex-Partner: No    Emotionally Abused: No    Physically  Abused: No    Sexually Abused: No    ALLERGIES: Aspirin  MEDICATIONS:  Current Outpatient Medications  Medication Sig Dispense Refill   amLODipine (NORVASC) 10 MG tablet Take 10 mg by mouth daily.     atorvastatin (LIPITOR) 20 MG tablet Take 1 tablet (20 mg total) by mouth daily. 90 tablet 3   clindamycin (CLINDAGEL) 1 % gel Apply 1 application topically daily. Applies to back of neck     colchicine 0.6 MG tablet TAKE 1 TABLET BY MOUTH 3 TIMES DAILY FOR 5 DAYS FOR GOUT PAIN. 15 tablet 3   febuxostat (ULORIC) 40 MG tablet Take 1 tablet (40 mg total) by mouth daily. 90 tablet 5   HYDROcodone-acetaminophen (NORCO/VICODIN) 5-325 MG tablet One tablet by mouth every six hours as needed for pain.  Must last 30 days. 50 tablet 0   iron polysaccharides (NIFEREX) 150 MG capsule Take 1 capsule (150 mg total) by mouth daily. 90 capsule 1   levothyroxine (SYNTHROID) 50 MCG tablet Take 50 mcg by mouth every morning.     levothyroxine (SYNTHROID, LEVOTHROID) 200 MCG tablet Take 1 tablet (200 mcg total) by mouth daily before breakfast. 90 tablet 3   metoprolol succinate (TOPROL-XL) 25 MG 24 hr tablet Take 1.5 tablets (37.5 mg total) by mouth daily. 45 tablet 6   omeprazole (PRILOSEC) 20 MG capsule TAKE 1 CAPSULE BY MOUTH ONCE DAILY. (Patient taking differently: Take 20 mg by mouth daily.) 30 capsule 3   ondansetron (ZOFRAN) 4 MG tablet Take 4 mg by mouth 3 (three) times daily as needed.     sildenafil (VIAGRA) 100 MG tablet Take 0.5 tablets (50 mg total) by mouth daily as needed. 10 tablet 5   tamsulosin (FLOMAX) 0.4 MG CAPS capsule Take 1 capsule (0.4 mg total) by mouth daily. 90 capsule 3   triamcinolone ointment (KENALOG) 0.5 % Apply 1 application topically 2 (two) times daily. 30 g 0   Vitamin D, Cholecalciferol, 25 MCG (1000 UT)  TABS Take 1 tablet by mouth daily.     No current facility-administered medications for this encounter.    REVIEW OF SYSTEMS:  On review of systems, the patient reports  that he is doing well overall. He denies any chest pain, shortness of breath, cough, fevers, chills, night sweats, unintended weight changes. He denies any bowel disturbances, and denies abdominal pain, nausea or vomiting. He denies any new musculoskeletal or joint aches or pains. His IPSS was ***, indicating *** urinary symptoms. His SHIM was ***, indicating he {does not have/has mild/moderate/severe} erectile dysfunction. A complete review of systems is obtained and is otherwise negative.    PHYSICAL EXAM:  Wt Readings from Last 3 Encounters:  04/07/23 234 lb 8 oz (106.4 kg)  03/22/23 230 lb (104.3 kg)  01/05/23 230 lb (104.3 kg)   Temp Readings from Last 3 Encounters:  03/08/23 98 F (36.7 C) (Oral)  09/28/22 97.8 F (36.6 C) (Tympanic)  02/23/22 98.4 F (36.9 C) (Oral)   BP Readings from Last 3 Encounters:  04/07/23 122/83  03/22/23 (!) 149/81  03/08/23 126/80   Pulse Readings from Last 3 Encounters:  04/07/23 80  03/22/23 87  03/08/23 83    /10  In general this is a well appearing *** male in no acute distress. He's alert and oriented x4 and appropriate throughout the examination. Cardiopulmonary assessment is negative for acute distress, and he exhibits normal effort.     KPS = ***  100 - Normal; no complaints; no evidence of disease. 90   - Able to carry on normal activity; minor signs or symptoms of disease. 80   - Normal activity with effort; some signs or symptoms of disease. 40   - Cares for self; unable to carry on normal activity or to do active work. 60   - Requires occasional assistance, but is able to care for most of his personal needs. 50   - Requires considerable assistance and frequent medical care. 40   - Disabled; requires special care and assistance. 30   - Severely disabled; hospital admission is indicated although death not imminent. 20   - Very sick; hospital admission necessary; active supportive treatment necessary. 10   - Moribund; fatal  processes progressing rapidly. 0     - Dead  Karnofsky DA, Abelmann WH, Craver LS and Burchenal [facility] [phone]) The use of the nitrogen mustards in the palliative treatment of carcinoma: with particular reference to bronchogenic carcinoma Cancer 1 [phone]  LABORATORY DATA:  Lab Results  Component Value Date   WBC 5.9 03/22/2023   HGB 11.8 (L) 03/22/2023   HCT 35.4 (L) 03/22/2023   MCV 98.6 03/22/2023   PLT 106 (L) 03/22/2023   Lab Results  Component Value Date   NA 135 03/22/2023   K 4.2 03/22/2023   CL 104 03/22/2023   CO2 26 03/22/2023   Lab Results  Component Value Date   ALT 29 03/22/2023   AST 45 (H) 03/22/2023   ALKPHOS 48 03/22/2023   BILITOT 1.4 (H) 03/22/2023     RADIOGRAPHY: No results found.    IMPRESSION/PLAN: 1. 72 y.o. gentleman with Stage T1c adenocarcinoma of the prostate with Gleason Score of 3+4, and PSA of 6. We discussed the patient's workup and outlined the nature of prostate cancer in this setting. The patient's T stage, Gleason's score, and PSA put him into the favorable intermediate risk group. Accordingly, he is eligible for a variety of potential treatment options including active surveillance, brachytherapy, 5.5 weeks  of external radiation, or prostatectomy. We discussed the available radiation techniques, and focused on the details and logistics of delivery. The patient may not be an ideal candidate for brachytherapy/boost with a prostate volume of 67 cc{ prior to downsizing from hormone therapy. We discussed that based on his prostate volume, he would require beginning treatment with a 5 alpha reductase inhibitor +/- ADT for at least 3 months to allow for downsizing of the prostate prior to initiating brachytherapy}. We discussed and outlined the risks, benefits, short and long-term effects associated with radiotherapy and compared and contrasted these with prostatectomy. We discussed the role of SpaceOAR gel in reducing the rectal toxicity associated with  radiotherapy.  He appears to have a good understanding of his disease and our treatment recommendations which are of curative intent.  He was encouraged to ask questions that were answered to his stated satisfaction.  At the conclusion of our conversation, the patient is interested in moving forward with ***.  We personally spent *** minutes in this encounter including chart review, reviewing radiological studies, meeting face-to-face with the patient, entering orders and completing documentation.    Marguarite Arbour, PA-C    Margaretmary Dys, MD  [facility]  Radiation Oncology Direct Dial: [phone]  Fax: [phone] [email or web address]  Skype  [email or web address]   This document serves as a record of services personally performed by Margaretmary Dys, MD and Marcello Fennel, PA-C. It was created on their behalf by Mickie Bail, a trained medical scribe. The creation of this record is based on the scribe's personal observations and the provider's statements to them. This document has been checked and approved by the attending provider.

## 2023-04-12 NOTE — Progress Notes (Signed)
GU Location of Tumor / Histology: Prostate Ca  If Prostate Cancer, Gleason Score is (3 + 4) and PSA is (6.0 on 12/28/2022)  Biopsies      Past/Anticipated interventions by urology, if any:   03/30/2023 Dr. Wilkie Aye   Past/Anticipated interventions by medical oncology, if any:   03/29/2023 Dr. Doreatha Massed    Weight changes, if any: {:18581}  IPSS: SHIM:  Bowel/Bladder complaints, if any: {:18581}   Nausea/Vomiting, if any: {:18581}  Pain issues, if any:  {:18581}  SAFETY ISSUES: Prior radiation? {:18581} Pacemaker/ICD? {:18581} Possible current pregnancy? Male Is the patient on methotrexate? No  Current Complaints / other details:

## 2023-04-13 ENCOUNTER — Ambulatory Visit
Admission: RE | Admit: 2023-04-13 | Discharge: 2023-04-13 | Disposition: A | Discharge status: Home / Self Care | Payer: 59 | Source: Ambulatory Visit | Attending: Radiation Oncology | Admitting: Radiation Oncology

## 2023-04-13 ENCOUNTER — Encounter: Payer: Self-pay | Admitting: Radiation Oncology

## 2023-04-13 ENCOUNTER — Ambulatory Visit: Admission: RE | Admit: 2023-04-13 | Payer: 59 | Source: Ambulatory Visit | Admitting: Radiation Oncology

## 2023-04-13 ENCOUNTER — Other Ambulatory Visit: Payer: Self-pay

## 2023-04-13 VITALS — BP 114/76 | HR 99 | Temp 98.2°F | Resp 20 | Ht 75.0 in | Wt 232.0 lb

## 2023-04-13 DIAGNOSIS — E039 Hypothyroidism, unspecified: Secondary | ICD-10-CM | POA: Diagnosis not present

## 2023-04-13 DIAGNOSIS — C61 Malignant neoplasm of prostate: Secondary | ICD-10-CM

## 2023-04-13 DIAGNOSIS — Z79890 Hormone replacement therapy (postmenopausal): Secondary | ICD-10-CM | POA: Diagnosis not present

## 2023-04-13 DIAGNOSIS — Z79899 Other long term (current) drug therapy: Secondary | ICD-10-CM | POA: Insufficient documentation

## 2023-04-13 DIAGNOSIS — Z8711 Personal history of peptic ulcer disease: Secondary | ICD-10-CM | POA: Diagnosis not present

## 2023-04-13 DIAGNOSIS — I10 Essential (primary) hypertension: Secondary | ICD-10-CM | POA: Diagnosis not present

## 2023-04-13 DIAGNOSIS — E538 Deficiency of other specified B group vitamins: Secondary | ICD-10-CM | POA: Diagnosis not present

## 2023-04-13 DIAGNOSIS — Z86010 Personal history of colonic polyps: Secondary | ICD-10-CM | POA: Insufficient documentation

## 2023-04-13 DIAGNOSIS — F1011 Alcohol abuse, in remission: Secondary | ICD-10-CM | POA: Diagnosis not present

## 2023-04-13 DIAGNOSIS — M109 Gout, unspecified: Secondary | ICD-10-CM | POA: Insufficient documentation

## 2023-04-13 DIAGNOSIS — K219 Gastro-esophageal reflux disease without esophagitis: Secondary | ICD-10-CM | POA: Diagnosis not present

## 2023-04-13 DIAGNOSIS — Z85020 Personal history of malignant carcinoid tumor of stomach: Secondary | ICD-10-CM | POA: Insufficient documentation

## 2023-04-13 DIAGNOSIS — D649 Anemia, unspecified: Secondary | ICD-10-CM | POA: Diagnosis not present

## 2023-04-13 NOTE — Progress Notes (Signed)
Introduced myself to the patient as the prostate nurse navigator.  He is here to discuss his radiation treatment options.  I gave him my business card and asked him to call me with questions or concerns.  Patient request for communication to be done through his brother, Gerlene Burdock.  RN left message with brother providing contact number.  Patient voiced he will keep upcoming surgical consult on 8/20, I will plan to follow up after consult to ensure treatment decision is finalized.

## 2023-04-14 ENCOUNTER — Telehealth: Payer: Self-pay | Admitting: Orthopaedic Surgery

## 2023-04-14 NOTE — Telephone Encounter (Signed)
Dr. Sanjuan King pt - pt presented to the office requesting a refill on Hydrocodone 5-325 to be sent to [facility].

## 2023-04-15 DIAGNOSIS — C61 Malignant neoplasm of prostate: Secondary | ICD-10-CM

## 2023-04-15 MED ORDER — HYDROCODONE-ACETAMINOPHEN 5-325 MG PO TABS: ORAL_TABLET | ORAL | 0 refills | Status: DC

## 2023-04-15 NOTE — Progress Notes (Signed)
Patient would like to proceed with radiation treatment in [facility] for his stage T1c adenocarcinoma of the prostate with Gleason Score of 3+4, and PSA of 6.   Referral will be placed to Dr. Langston Masker.  Patient still plans to keep upcoming appointment with Dr. Laverle Patter to hear all options.

## 2023-04-15 NOTE — Progress Notes (Signed)
RN received call from [place] requesting call back on behalf of patient.  RN returned called, no answer. Voicemail box full.

## 2023-04-27 NOTE — Progress Notes (Signed)
Patient will proceed with daily radiation with Dr. Langston Masker in [place].  Recent consult with Dr. Langston Masker on 8/21.  No additional needs at this time.

## 2023-04-28 ENCOUNTER — Telehealth: Payer: Self-pay | Admitting: Urology

## 2023-04-28 ENCOUNTER — Telehealth: Payer: Self-pay

## 2023-04-28 NOTE — Telephone Encounter (Signed)
[facility] call about getting patient on the scheduled for surgery. Nurse brittany states patient need spacer and marker so the cancer center can managed his radiation treatment.  Please contact [place] [phone]

## 2023-04-28 NOTE — Telephone Encounter (Signed)
Patient needs scheduled for spacer fiducials for cancer treatments , please call to get scheduled

## 2023-05-04 ENCOUNTER — Other Ambulatory Visit: Payer: Self-pay | Admitting: Urology

## 2023-05-04 DIAGNOSIS — C61 Malignant neoplasm of prostate: Secondary | ICD-10-CM

## 2023-05-07 ENCOUNTER — Other Ambulatory Visit: Payer: Self-pay

## 2023-05-07 DIAGNOSIS — C61 Malignant neoplasm of prostate: Secondary | ICD-10-CM

## 2023-05-07 MED ORDER — FLEET ENEMA RE ENEM: 1.0000 | ENEMA | Freq: Once | RECTAL | 0 refills | Status: AC

## 2023-05-17 ENCOUNTER — Telehealth: Payer: Self-pay | Admitting: Orthopaedic Surgery

## 2023-05-17 MED ORDER — HYDROCODONE-ACETAMINOPHEN 5-325 MG PO TABS: ORAL_TABLET | ORAL | 0 refills | Status: DC

## 2023-05-17 NOTE — Telephone Encounter (Signed)
Dr. Sanjuan Dame pt - pt is requesting a refill on Hydrocodone 5-325 to be sent to [facility]

## 2023-05-17 NOTE — Addendum Note (Signed)
Addended by: Earnstine Regal on: 05/17/2023 08:50 PM   Modules accepted: Orders

## 2023-05-24 ENCOUNTER — Telehealth: Payer: Self-pay | Admitting: Orthopaedic Surgery

## 2023-05-31 ENCOUNTER — Other Ambulatory Visit (HOSPITAL_COMMUNITY): Payer: 59

## 2023-06-03 ENCOUNTER — Ambulatory Visit (HOSPITAL_COMMUNITY)
Admission: RE | Admit: 2023-06-03 | Discharge: 2023-06-03 | Disposition: A | Discharge status: Home / Self Care | Payer: 59 | Source: Ambulatory Visit | Attending: Urology | Admitting: Urology

## 2023-06-03 ENCOUNTER — Encounter (HOSPITAL_COMMUNITY): Payer: Self-pay

## 2023-06-03 DIAGNOSIS — C61 Malignant neoplasm of prostate: Secondary | ICD-10-CM

## 2023-06-07 ENCOUNTER — Encounter (HOSPITAL_COMMUNITY)
Admission: RE | Admit: 2023-06-07 | Discharge: 2023-06-07 | Disposition: A | Discharge status: Home / Self Care | Payer: 59 | Source: Ambulatory Visit | Attending: Urology | Admitting: Urology

## 2023-06-08 ENCOUNTER — Encounter (HOSPITAL_COMMUNITY): Payer: Self-pay

## 2023-06-09 ENCOUNTER — Telehealth: Payer: Self-pay

## 2023-06-09 NOTE — Telephone Encounter (Signed)
Notified via email- nothing further needed at this time.  Case has been updated and expedited and appropriate staff notified.

## 2023-06-09 NOTE — Telephone Encounter (Signed)
[name] from [facility] [phone]   needing to verify a cancelled imaging.  Will need a pre certification on CP code [id number].  Please advise.

## 2023-06-10 ENCOUNTER — Ambulatory Visit (HOSPITAL_BASED_OUTPATIENT_CLINIC_OR_DEPARTMENT_OTHER): Payer: 59 | Admitting: Certified Registered Nurse Anesthetist

## 2023-06-10 ENCOUNTER — Ambulatory Visit (HOSPITAL_COMMUNITY): Payer: 59 | Admitting: Certified Registered Nurse Anesthetist

## 2023-06-10 ENCOUNTER — Ambulatory Visit (HOSPITAL_COMMUNITY): Payer: 59

## 2023-06-10 ENCOUNTER — Encounter (HOSPITAL_COMMUNITY): Payer: Self-pay | Admitting: Urology

## 2023-06-10 ENCOUNTER — Ambulatory Visit (HOSPITAL_COMMUNITY)
Admission: RE | Admit: 2023-06-10 | Discharge: 2023-06-10 | Disposition: A | Discharge status: Home / Self Care | Payer: 59 | Attending: Urology | Admitting: Urology

## 2023-06-10 ENCOUNTER — Ambulatory Visit (HOSPITAL_COMMUNITY)
Admission: RE | Admit: 2023-06-10 | Discharge: 2023-06-10 | Disposition: A | Discharge status: Home / Self Care | Payer: 59 | Source: Ambulatory Visit | Attending: Urology | Admitting: Urology

## 2023-06-10 ENCOUNTER — Encounter (HOSPITAL_COMMUNITY)
Admission: RE | Disposition: A | Discharge status: Home / Self Care | Payer: Self-pay | Source: Home / Self Care | Attending: Urology

## 2023-06-10 DIAGNOSIS — Z860101 Personal history of adenomatous and serrated colon polyps: Secondary | ICD-10-CM | POA: Insufficient documentation

## 2023-06-10 DIAGNOSIS — C61 Malignant neoplasm of prostate: Secondary | ICD-10-CM | POA: Diagnosis not present

## 2023-06-10 DIAGNOSIS — Z8249 Family history of ischemic heart disease and other diseases of the circulatory system: Secondary | ICD-10-CM | POA: Insufficient documentation

## 2023-06-10 DIAGNOSIS — Z9049 Acquired absence of other specified parts of digestive tract: Secondary | ICD-10-CM | POA: Insufficient documentation

## 2023-06-10 DIAGNOSIS — I10 Essential (primary) hypertension: Secondary | ICD-10-CM | POA: Diagnosis not present

## 2023-06-10 DIAGNOSIS — Z96653 Presence of artificial knee joint, bilateral: Secondary | ICD-10-CM | POA: Insufficient documentation

## 2023-06-10 DIAGNOSIS — K219 Gastro-esophageal reflux disease without esophagitis: Secondary | ICD-10-CM | POA: Diagnosis not present

## 2023-06-10 DIAGNOSIS — Z8711 Personal history of peptic ulcer disease: Secondary | ICD-10-CM | POA: Insufficient documentation

## 2023-06-10 DIAGNOSIS — E039 Hypothyroidism, unspecified: Secondary | ICD-10-CM | POA: Diagnosis not present

## 2023-06-10 HISTORY — PX: SPACE OAR INSTILLATION: SHX6769

## 2023-06-10 HISTORY — PX: GOLD SEED IMPLANT: SHX6343

## 2023-06-10 SURGERY — INSERTION, GOLD SEEDS
Anesthesia: General | Site: Prostate

## 2023-06-10 MED ORDER — LIDOCAINE HCL (PF) 2 % IJ SOLN
INTRAMUSCULAR | Status: DC | PRN
Start: 2023-06-10 — End: 2023-06-10
  Administered 2023-06-10: 60 mg via INTRADERMAL

## 2023-06-10 MED ORDER — PHENYLEPHRINE 80 MCG/ML (10ML) SYRINGE FOR IV PUSH (FOR BLOOD PRESSURE SUPPORT)
PREFILLED_SYRINGE | INTRAVENOUS | Status: AC
  Filled 2023-06-10: qty 10

## 2023-06-10 MED ORDER — ONDANSETRON HCL 4 MG/2ML IJ SOLN
INTRAMUSCULAR | Status: AC
  Filled 2023-06-10: qty 2

## 2023-06-10 MED ORDER — CHLORHEXIDINE GLUCONATE 0.12 % MT SOLN: 15.0000 mL | Freq: Once | OROMUCOSAL | Status: AC

## 2023-06-10 MED ORDER — SODIUM CHLORIDE (PF) 0.9 % IJ SOLN
INTRAMUSCULAR | Status: AC
  Filled 2023-06-10: qty 10

## 2023-06-10 MED ORDER — PROPOFOL 10 MG/ML IV BOLUS
INTRAVENOUS | Status: DC | PRN
  Administered 2023-06-10: 150 mg via INTRAVENOUS

## 2023-06-10 MED ORDER — FENTANYL CITRATE (PF) 100 MCG/2ML IJ SOLN
INTRAMUSCULAR | Status: AC
  Filled 2023-06-10: qty 2

## 2023-06-10 MED ORDER — CEFAZOLIN SODIUM-DEXTROSE 2-4 GM/100ML-% IV SOLN
2.0000 g | INTRAVENOUS | Status: AC
  Administered 2023-06-10: 2 g via INTRAVENOUS

## 2023-06-10 MED ORDER — CHLORHEXIDINE GLUCONATE 0.12 % MT SOLN
OROMUCOSAL | Status: AC
  Administered 2023-06-10: 15 mL via OROMUCOSAL
  Filled 2023-06-10: qty 45

## 2023-06-10 MED ORDER — ORAL CARE MOUTH RINSE: 15.0000 mL | Freq: Once | OROMUCOSAL | Status: AC

## 2023-06-10 MED ORDER — LACTATED RINGERS IV SOLN: INTRAVENOUS | Status: DC

## 2023-06-10 MED ORDER — LIDOCAINE HCL (PF) 2 % IJ SOLN
INTRAMUSCULAR | Status: AC
  Filled 2023-06-10: qty 5

## 2023-06-10 MED ORDER — ONDANSETRON HCL 4 MG/2ML IJ SOLN
INTRAMUSCULAR | Status: DC | PRN
  Administered 2023-06-10: 4 mg via INTRAVENOUS

## 2023-06-10 MED ORDER — SODIUM CHLORIDE (PF) 0.9 % IJ SOLN
INTRAMUSCULAR | Status: DC | PRN
  Administered 2023-06-10: 10 mL

## 2023-06-10 MED ORDER — FENTANYL CITRATE (PF) 100 MCG/2ML IJ SOLN
INTRAMUSCULAR | Status: DC | PRN
  Administered 2023-06-10: 25 ug via INTRAVENOUS

## 2023-06-10 MED ORDER — CEFAZOLIN SODIUM-DEXTROSE 2-4 GM/100ML-% IV SOLN
INTRAVENOUS | Status: AC
  Filled 2023-06-10: qty 100

## 2023-06-10 MED ORDER — STERILE WATER FOR IRRIGATION IR SOLN
Status: DC | PRN
Start: 2023-06-10 — End: 2023-06-10
  Administered 2023-06-10: 500 mL

## 2023-06-10 MED ORDER — PHENYLEPHRINE 80 MCG/ML (10ML) SYRINGE FOR IV PUSH (FOR BLOOD PRESSURE SUPPORT)
PREFILLED_SYRINGE | INTRAVENOUS | Status: DC | PRN
Start: 2023-06-10 — End: 2023-06-10
  Administered 2023-06-10: 80 ug via INTRAVENOUS

## 2023-06-10 SURGICAL SUPPLY — 26 items
COVER BACK TABLE 60X90IN (DRAPES) ×1 IMPLANT
COVER TABLE BACK 60X90 (DRAPES) ×1 IMPLANT
DRAPE EENT ADH APERT 31X51 STR (DRAPES) ×1 IMPLANT
DRAPE LEGGINS SURG 28X43 STRL (DRAPES) ×1 IMPLANT
DRAPE U-SHAPE 47X51 STRL (DRAPES) ×1 IMPLANT
DRSG TEGADERM 4X10 (GAUZE/BANDAGES/DRESSINGS) ×1 IMPLANT
GAUZE SPONGE 4X4 12PLY STRL (GAUZE/BANDAGES/DRESSINGS) ×2 IMPLANT
GLOVE BIO SURGEON STRL SZ8 (GLOVE) ×1 IMPLANT
GLOVE BIOGEL PI IND STRL 7.0 (GLOVE) ×2 IMPLANT
GLOVE BIOGEL PI IND STRL 8 (GLOVE) ×1 IMPLANT
GOWN STRL REUS W/TWL LRG LVL3 (GOWN DISPOSABLE) ×1 IMPLANT
IMPL SPACEOAR SYSTEM 10ML (Spacer) ×1 IMPLANT
IMPLANT SPACEOAR SYSTEM 10ML (Spacer) ×1 IMPLANT
KIT TURNOVER CYSTO (KITS) ×1 IMPLANT
MARKER GOLD PRELOAD 1.2X3 (Urological Implant) ×1 IMPLANT
MARKER SKIN DUAL TIP RULER LAB (MISCELLANEOUS) ×1 IMPLANT
NS IRRIG 1000ML POUR BTL (IV SOLUTION) ×1 IMPLANT
PAD ARMBOARD 7.5X6 YLW CONV (MISCELLANEOUS) ×2 IMPLANT
POSITIONER HEAD 8X9X4 ADT (SOFTGOODS) ×1 IMPLANT
SEED GOLD PRELOAD 1.2X3 (Urological Implant) ×1 IMPLANT
SOL PREP POV-IOD 4OZ 10% (MISCELLANEOUS) ×1 IMPLANT
SYR 10ML LL (SYRINGE) ×1 IMPLANT
SYR CONTROL 10ML LL (SYRINGE) ×1 IMPLANT
TOWEL OR 17X26 4PK STRL BLUE (TOWEL DISPOSABLE) ×1 IMPLANT
UNDERPAD 30X36 HEAVY ABSORB (UNDERPADS AND DIAPERS) ×1 IMPLANT
WATER STERILE IRR 500ML POUR (IV SOLUTION) ×1 IMPLANT

## 2023-06-10 NOTE — Anesthesia Preprocedure Evaluation (Signed)
Anesthesia Evaluation  Patient identified by MRN, date of birth, ID band Patient awake    Reviewed: Allergy & Precautions, H&P , NPO status , Patient's Chart, lab work & pertinent test results, reviewed documented beta blocker date and time   Airway Mallampati: II  TM Distance: >3 FB Neck ROM: full    Dental no notable dental hx.    Pulmonary neg pulmonary ROS   Pulmonary exam normal breath sounds clear to auscultation       Cardiovascular Exercise Tolerance: Good hypertension, negative cardio ROS  Rhythm:regular Rate:Normal     Neuro/Psych negative neurological ROS  negative psych ROS   GI/Hepatic negative GI ROS, Neg liver ROS, PUD,GERD  ,,  Endo/Other  negative endocrine ROSHypothyroidism    Renal/GU Renal diseasenegative Renal ROS  negative genitourinary   Musculoskeletal   Abdominal   Peds  Hematology negative hematology ROS (+) Blood dyscrasia, anemia   Anesthesia Other Findings   Reproductive/Obstetrics negative OB ROS                             Anesthesia Physical Anesthesia Plan  ASA: 3  Anesthesia Plan: General and General LMA   Post-op Pain Management:    Induction:   PONV Risk Score and Plan: Ondansetron  Airway Management Planned:   Additional Equipment:   Intra-op Plan:   Post-operative Plan:   Informed Consent: I have reviewed the patients History and Physical, chart, labs and discussed the procedure including the risks, benefits and alternatives for the proposed anesthesia with the patient or authorized representative who has indicated his/her understanding and acceptance.     Dental Advisory Given  Plan Discussed with: CRNA  Anesthesia Plan Comments:        Anesthesia Quick Evaluation

## 2023-06-10 NOTE — Op Note (Signed)
PRE-OPERATIVE DIAGNOSIS:  Adenocarcinoma of the prostate  POST-OPERATIVE DIAGNOSIS:  Same  PROCEDURE: 1. Prostate Ultrasound 2. Placement of fiducial marks 3. Placement of SpaceOAR  SURGEON:  Surgeon(s): Wilkie Aye, MD  ANESTHESIA:  General  EBL:  Minimal  DRAINS: none  FINDINGS: 52.4cc prostate on prostate [place]  INDICATION: Mr [name] is a 72 year old with a history of T1c prostate cancer who is scheduled to undergo IMRT. He wishes to have fiducial markers and SpaceOAR placed prior to IMRT to decrease rectal toxicity.  Description of procedure: After informed consent the patient was brought to the major OR, placed on the table and administered general anesthesia. He was then moved to the modified lithotomy position with his perineum perpendicular to the floor. His perineum and genitalia were then sterilely prepped. An official timeout was then performed. The transrectal ultrasound probe was placed in the rectum and affixed to the stand. He was then sterilely draped.  A transrectal ultrasound of the prostate was performed.  Lidocaine  was not  instilled using ultrasound guidance into the junction of each seminal vesicle of the prostate.  3 Gold markers were placed into the prostate using the standard template and ultrasound guidance.  Accurate placement of the markers was confirmed.  We then proceeded to mix the SpaceOAR using the kit supplied from the manufacturer. Once this was complete we placed a sinal needle into the perirectal fat between the rectum and the prostate. Once this was accomplished we injected 2cc of normal saline to hydrodissect the plain. We then instilled the the SpaceOAR through the spinal needle and noted good distribution in the perirectal fat.   The patient was awakened and taken to recovery room in stable and satisfactory condition. He tolerated procedure well and there were no intraoperative complications.  CONDITION: Stable, extubated, transferred to  PACU  PLAN: The patient is to be discharged home and he will start IMRT in the next 2-3 weeks

## 2023-06-10 NOTE — H&P (Signed)
HPI: [name] is a 72yo here for fiducial markers and SPaceOAR. Biopsy revealed Gleason 3+4=7 in 1/12 cores. PSA 6.0.      PMH:     Past Medical History:  Diagnosis Date   Anemia      FeDA: GASTRIC POLYPS, B12; TCS 2008 EGD 2009, 2008-HB 11.1 MCV 83.6 CR 1.22, 2009 FERRITIN 102-22   B12 deficiency     Carcinoid tumor determined by biopsy of stomach 09/25/2014   Chronic knee pain     Diverticulosis of colon      Lower GI bleed 2008   Essential hypertension     GERD (gastroesophageal reflux disease)     Gout     Hepatomegaly      Hepatic steatosis   History of alcohol abuse     History of septic arthritis     Hypothyroidism     PUD     Substance abuse (HCC)            Surgical History:      Past Surgical History:  Procedure Laterality Date   BIOPSY   08/14/2014    Procedure: BIOPSY;  Surgeon: West Bali, MD;  Location: [facility];  Service: Endoscopy;;   BIOPSY   03/12/2015    Procedure: BIOPSY;  Surgeon: West Bali, MD;  Location: [facility];  Service: Endoscopy;;   BIOPSY   04/21/2016    Procedure: BIOPSY;  Surgeon: West Bali, MD;  [facility]: [facility];  Service: Endoscopy;;  bx's of antrum, body of stomach, fundus, and cardia   CHOLECYSTECTOMY       COLONOSCOPY   2008 [facility] DJ    LGIB 2o to TICS, prep good   COLONOSCOPY WITH PROPOFOL N/A 08/14/2014    SLF: 1. Four large colon polyps removed. 2. The left colon is redundant 3. Moderate diverticulosis throughout teh entire examined colon   COLONOSCOPY WITH PROPOFOL N/A 11/06/2014    SLF: 8 small polyps removed. One large pedunculated polyp removed from the ascending colon, tubulovillous and tubular adenomas. Next colonoscopy March 2019   COLONOSCOPY WITH PROPOFOL N/A 01/04/2018    Procedure: COLONOSCOPY WITH PROPOFOL;  Surgeon: West Bali, MD;  [facility]: [facility];  Service: Endoscopy;  Laterality: N/A;  10:00am   ESOPHAGOGASTRODUODENOSCOPY   12/08/2007    ZOX:WRUE gastric polyps seen in the cardia and body of  the stomach/Normal esophagus without evidence of Barrett's, mass, erosion/Normal duodenal bulb and second portion of the duodenum. Benign bx.   ESOPHAGOGASTRODUODENOSCOPY (EGD) WITH PROPOFOL N/A 08/14/2014    SLF: 1. Heme postive stools due to gastric and colon polyps 2. Multiple gastric    ESOPHAGOGASTRODUODENOSCOPY (EGD) WITH PROPOFOL N/A 03/12/2015    SLF: Multiple gastric nodules seen in gasric body/antrum. 2. Non-erosive gastritis ( inflammation) was found in the gastric antrum.    ESOPHAGOGASTRODUODENOSCOPY (EGD) WITH PROPOFOL N/A 10/08/2015    Procedure: ESOPHAGOGASTRODUODENOSCOPY (EGD) WITH PROPOFOL;  Surgeon: West Bali, MD;  [facility];  Service: Endoscopy;  Laterality: N/A;  0945   ESOPHAGOGASTRODUODENOSCOPY (EGD) WITH PROPOFOL N/A 04/21/2016    Procedure: ESOPHAGOGASTRODUODENOSCOPY (EGD) WITH PROPOFOL;  Surgeon: West Bali, MD;  [facility]: [facility];  Service: Endoscopy;  Laterality: N/A;  730     HAND SURGERY Right      fracture repair with plates   INGUINAL HERNIA REPAIR Left 11/09/2018    Procedure: HERNIA REPAIR INGUINAL ADULT WITH MESH;  Surgeon: Lucretia Roers, MD;  Location: [facility];  Service: General;  Laterality: Left;   JOINT REPLACEMENT Bilateral      knees   POLYPECTOMY   08/14/2014    Procedure: POLYPECTOMY;  Surgeon: West Bali, MD;  Location: [facility];  Service: Endoscopy;;   POLYPECTOMY N/A 11/06/2014    Procedure: POLYPECTOMY;  Surgeon: West Bali, MD;  Location: [facility];  Service: Endoscopy;  Laterality: N/A;  Transverse Colon x3, Ascending Colon x2, Descending Colon x3   REPLACEMENT TOTAL KNEE BILATERAL       UPPER GASTROINTESTINAL ENDOSCOPY   AP[facility] 2009    INFLAMED HYPERPLASTIC POLYPS, CHRONIC GASTRITIS          Home Medications:  Allergies as of 03/22/2023         Reactions    Aspirin Other (See Comments)    REACTION: Diverticular Bleed            Medication List           Accurate as of March 22, 2023 12:17 PM. If you  have any questions, ask your nurse or doctor.              amLODipine 10 MG tablet Commonly known as: NORVASC Take 10 mg by mouth daily.    atorvastatin 20 MG tablet Commonly known as: LIPITOR Take 1 tablet (20 mg total) by mouth daily.    clindamycin 1 % gel Commonly known as: CLINDAGEL Apply 1 application topically daily. Applies to back of neck    colchicine 0.6 MG tablet TAKE 1 TABLET BY MOUTH 3 TIMES DAILY FOR 5 DAYS FOR GOUT PAIN.    febuxostat 40 MG tablet Commonly known as: Uloric Take 1 tablet (40 mg total) by mouth daily.    HYDROcodone-acetaminophen 5-325 MG tablet Commonly known as: NORCO/VICODIN One tablet by mouth every six hours as needed for pain.  Must last 30 days.    iron polysaccharides 150 MG capsule Commonly known as: NIFEREX Take 1 capsule (150 mg total) by mouth daily.    levothyroxine 200 MCG tablet Commonly known as: SYNTHROID Take 1 tablet (200 mcg total) by mouth daily before breakfast.    levothyroxine 50 MCG tablet Commonly known as: SYNTHROID Take 50 mcg by mouth every morning.    metoprolol succinate 25 MG 24 hr tablet Commonly known as: TOPROL-XL Take 1.5 tablets (37.5 mg total) by mouth daily.    omeprazole 20 MG capsule Commonly known as: PRILOSEC TAKE 1 CAPSULE BY MOUTH ONCE DAILY.    ondansetron 4 MG tablet Commonly known as: ZOFRAN Take 4 mg by mouth 3 (three) times daily as needed.    sildenafil 100 MG tablet Commonly known as: VIAGRA Take 0.5 tablets (50 mg total) by mouth daily as needed.    tamsulosin 0.4 MG Caps capsule Commonly known as: Flomax Take 1 capsule (0.4 mg total) by mouth daily.    triamcinolone ointment 0.5 % Commonly known as: KENALOG Apply 1 application topically 2 (two) times daily.    Vitamin D (Cholecalciferol) 25 MCG (1000 UT) Tabs Take 1 tablet by mouth daily.             Allergies:  Allergies       Allergies  Allergen Reactions   Aspirin Other (See Comments)      REACTION:  Diverticular Bleed        Family History:      Family History  Problem Relation Age of Onset   Diabetes Mother     Renal Disease Mother  failure   Diabetes Father          'old age'   Hypertension Sister     Hypertension Brother     Diabetes Brother     Diabetes Sister     Diabetes Sister     Diabetes Brother     Hypertension Brother     Diabetes Brother          right BKA   Kidney failure Brother          on dialysis   Kidney failure Brother          on dialysis   Diabetes Brother          bilateral BKA   Colon polyps Neg Hx     Colon cancer Neg Hx     Pancreatic disease Neg Hx            Social History:  reports that he has never smoked. He has never used smokeless tobacco. He reports current alcohol use of about 2.0 standard drinks of alcohol per week. He reports that he does not use drugs.   ROS: All other review of systems were reviewed and are negative except what is noted above in HPI   Physical Exam: BP (!) 149/81   Pulse 87   Ht 6\' 3"  (1.905 m)   Wt 230 lb (104.3 kg)   BMI 28.75 kg/m   Constitutional:  Alert and oriented, No acute distress. HEENT: [place] AT, moist mucus membranes.  Trachea midline, no masses. Cardiovascular: No clubbing, cyanosis, or edema. Respiratory: Normal respiratory effort, no increased work of breathing. GI: Abdomen is soft, nontender, nondistended, no abdominal masses GU: No CVA tenderness.  Lymph: No cervical or inguinal lymphadenopathy. Skin: No rashes, bruises or suspicious lesions. Neurologic: Grossly intact, no focal deficits, moving all 4 extremities. Psychiatric: Normal mood and affect.   Laboratory Data: Recent Labs       Lab Results  Component Value Date    WBC 5.9 03/22/2023    HGB 11.8 (L) 03/22/2023    HCT 35.4 (L) 03/22/2023    MCV 98.6 03/22/2023    PLT 106 (L) 03/22/2023        Recent Labs       Lab Results  Component Value Date    CREATININE 1.26 (H) 03/22/2023        Recent Labs        Lab Results  Component Value Date    PSA 2.23 11/30/2008    PSA 2.90 06/02/2007        Recent Labs  No results found for: "TESTOSTERONE"     Recent Labs       Lab Results  Component Value Date    HGBA1C 5.6 10/16/2016        Urinalysis Labs (Brief)          Component Value Date/Time    COLORURINE YELLOW 07/25/2020 0043    APPEARANCEUR Clear 10/23/2022 0959    LABSPEC 1.009 07/25/2020 0043    PHURINE 5.0 07/25/2020 0043    GLUCOSEU Negative 10/23/2022 0959    HGBUR NEGATIVE 07/25/2020 0043    HGBUR negative 05/15/2008 0830    BILIRUBINUR Negative 10/23/2022 0959    KETONESUR NEGATIVE 07/25/2020 0043    PROTEINUR Negative 10/23/2022 0959    PROTEINUR NEGATIVE 07/25/2020 0043    UROBILINOGEN 0.2 09/11/2014 1454    NITRITE Negative 10/23/2022 0959    NITRITE NEGATIVE 07/25/2020 0043    LEUKOCYTESUR Trace (A) 10/23/2022 0959  LEUKOCYTESUR TRACE (A) 07/25/2020 0043        Recent Labs       Lab Results  Component Value Date    LABMICR See below: 10/23/2022    WBCUA 0-5 10/23/2022    LABEPIT 0-10 10/23/2022    BACTERIA None seen 10/23/2022        Pertinent Imaging:   Results for orders placed during the hospital encounter of 12/13/05   DG Abd 1 View   Narrative Clinical Data: 72 year old feeling bad; vomiting. Fever. Comparison: 04/26/04 and CT 04/26/04. ABDOMEN - 1 VIEW: Surgical clips are seen in the right upper quadrant. Bowel gas pattern is non-obstructive. There is no evidence for free intraperitoneal air on the supine view. Degenerative changes are noted in the spine. No abnormal calcifications identified.   Impression No evidence for bowel obstruction.   Provider: Maurice March   No results found for this or any previous visit.   No results found for this or any previous visit.   No results found for this or any previous visit.   Results for orders placed during the hospital encounter of 10/17/08   US Renal   Narrative Clinical Data: A  R S/anemia.  Renal failure   RENAL/URINARY TRACT ULTRASOUND   Technique:  Complete ultrasound examination of the urinary tract was performed including evaluation of the kidneys, renal collecting systems, and urinary bladder.   Comparison: 08/29/2008 ultrasound   Findings: The right and left kidneys measure 13.2 and 12.0 cm in length, respectively.  Bilateral increased echogenicity of the renal cortex.  No renal mass, focal scarring, or hydronephrosis. Bladder unremarkable.  Prostate gland prominent.   IMPRESSION: Findings compatible with nonspecific renal medical disease.  No hydronephrosis.   Provider: Vertell Novak   No valid procedures specified. No results found for this or any previous visit.   No results found for this or any previous visit.     Assessment & Plan:     1. Prostate cancer [facility]) I discussed the natural history of favorable intermediate risk prostate cancer with the patient and the various treatment options including active surveillance, RALP, IMRT, brachytherapy, cryotherapy, HIFU and ADT.  AFter discussing the options the patient elects for fiducial markers with SpaceOAR. Risks/benefits/alternatives discussed

## 2023-06-10 NOTE — Anesthesia Postprocedure Evaluation (Signed)
Anesthesia Post Note  Patient: Vianne Bulls  Procedure(s) Performed: GOLD SEED IMPLANT (Prostate) SPACE OAR INSTILLATION (Prostate)  Patient location during evaluation: Phase II Anesthesia Type: General Level of consciousness: awake Pain management: pain level controlled Vital Signs Assessment: post-procedure vital signs reviewed and stable Respiratory status: spontaneous breathing and respiratory function stable Cardiovascular status: blood pressure returned to baseline and stable Postop Assessment: no headache and no apparent nausea or vomiting Anesthetic complications: no Comments: Late entry   No notable events documented.   Last Vitals:  Vitals:   06/10/23 1000 06/10/23 1017  BP: 120/79 111/74  Pulse: 64 63  Resp: 10 16  Temp: (!) 36.4 C 36.4 C  SpO2: 97% 96%    Last Pain:  Vitals:   06/10/23 1017  TempSrc: Oral  PainSc: 0-No pain                 Windell Norfolk

## 2023-06-10 NOTE — Transfer of Care (Signed)
Immediate Anesthesia Transfer of Care Note  Patient: [name]  Procedure(s) Performed: GOLD SEED IMPLANT (Prostate) SPACE OAR INSTILLATION (Prostate)  Patient Location: PACU  Anesthesia Type:General  Level of Consciousness: awake and alert   Airway & Oxygen Therapy: Patient Spontanous Breathing  Post-op Assessment: Report given to RN and Post -op Vital signs reviewed and stable  Post vital signs: Reviewed and stable  Last Vitals:  Vitals Value Taken Time  BP 117/74   Temp 97.5   Pulse 66 06/10/23 0951  Resp 13   SpO2 99 % 06/10/23 0951  Vitals shown include unfiled device data.  Last Pain:  Vitals:   06/10/23 0908  PainSc: 0-No pain         Complications: No notable events documented.

## 2023-06-10 NOTE — Anesthesia Procedure Notes (Signed)
Procedure Name: LMA Insertion Date/Time: 06/10/2023 9:25 AM  Performed by: Lorin Glass, CRNAPre-anesthesia Checklist: Emergency Drugs available, Patient identified, Suction available and Patient being monitored Patient Re-evaluated:Patient Re-evaluated prior to induction Oxygen Delivery Method: Circle system utilized Preoxygenation: Pre-oxygenation with 100% oxygen Induction Type: IV induction LMA: LMA inserted LMA Size: 4.0 Number of attempts: 1 Placement Confirmation: positive ETCO2 and breath sounds checked- equal and bilateral Tube secured with: Tape Dental Injury: Teeth and Oropharynx as per pre-operative assessment

## 2023-06-14 ENCOUNTER — Telehealth: Payer: Self-pay

## 2023-06-14 MED ORDER — HYDROCODONE-ACETAMINOPHEN 5-325 MG PO TABS: ORAL_TABLET | ORAL | 0 refills | Status: DC

## 2023-06-14 NOTE — Telephone Encounter (Signed)
Hydrocodone-Acetaminophen 5/325 MG  Qty 50 Tablets  PATIENT USES [facility]

## 2023-06-15 ENCOUNTER — Encounter (HOSPITAL_COMMUNITY): Payer: Self-pay | Admitting: Urology

## 2023-06-24 ENCOUNTER — Other Ambulatory Visit: Payer: 59

## 2023-06-28 ENCOUNTER — Ambulatory Visit: Payer: 59 | Admitting: Urology

## 2023-07-07 ENCOUNTER — Ambulatory Visit: Payer: 59 | Admitting: Orthopaedic Surgery

## 2023-07-08 ENCOUNTER — Ambulatory Visit: Payer: 59 | Admitting: Orthopaedic Surgery

## 2023-07-13 ENCOUNTER — Ambulatory Visit: Payer: 59 | Admitting: Orthopaedic Surgery

## 2023-07-14 ENCOUNTER — Ambulatory Visit: Payer: 59 | Admitting: Orthopaedic Surgery

## 2023-07-14 ENCOUNTER — Encounter: Payer: Self-pay | Admitting: Orthopaedic Surgery

## 2023-07-14 DIAGNOSIS — M1A0411 Idiopathic chronic gout, right hand, with tophus (tophi): Secondary | ICD-10-CM

## 2023-07-14 MED ORDER — HYDROCODONE-ACETAMINOPHEN 7.5-325 MG PO TABS: ORAL_TABLET | ORAL | 0 refills | Status: DC

## 2023-07-14 NOTE — Progress Notes (Signed)
My hands hurt now and then.  He has gout with recent pain in the hands.  He is out of his pain medicine.  He is taking his Uloric.  He has gouty tophus on the right hand at wrist.  NV intact. ROM of fingers is good.  Encounter Diagnosis  Name Primary?   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  Return in three months.  Continue the Uloric.  Call if any problem.  Precautions discussed.  Electronically Signed Darreld Mclean, MD 11/13/20242:19 PM

## 2023-08-03 ENCOUNTER — Telehealth: Payer: Self-pay | Admitting: Orthopaedic Surgery

## 2023-08-16 ENCOUNTER — Telehealth: Payer: Self-pay

## 2023-08-16 MED ORDER — COLCHICINE 0.6 MG PO TABS: ORAL_TABLET | ORAL | 3 refills | Status: DC

## 2023-08-16 NOTE — Telephone Encounter (Signed)
colchicine 0.6 MG tablet Qty 15 Tablets  PATIENT USES [facility]

## 2023-08-18 ENCOUNTER — Telehealth: Payer: Self-pay

## 2023-08-18 MED ORDER — HYDROCODONE-ACETAMINOPHEN 7.5-325 MG PO TABS: ORAL_TABLET | ORAL | 0 refills | Status: DC

## 2023-08-18 NOTE — Telephone Encounter (Signed)
DR. Hilda Lias    Patient came in the office today requesting a refill on his pain medicine   HYDROcodone-acetaminophen (NORCO) 7.5-325 MG tablet    Pharmamcy: [facility]

## 2023-09-13 ENCOUNTER — Telehealth: Payer: Self-pay

## 2023-09-13 NOTE — Telephone Encounter (Signed)
 Hydrocodone-Acetaminophen 7.5/325 MG Qty 50 Tablets  PATIENT USES [facility]

## 2023-09-14 MED ORDER — HYDROCODONE-ACETAMINOPHEN 7.5-325 MG PO TABS: ORAL_TABLET | ORAL | 0 refills | Status: DC

## 2023-09-18 ENCOUNTER — Emergency Department (HOSPITAL_COMMUNITY)
Admission: EM | Admit: 2023-09-18 | Discharge: 2023-09-18 | Disposition: A | Discharge status: Home / Self Care | Payer: 59 | Attending: Emergency Medicine | Admitting: Emergency Medicine

## 2023-09-18 ENCOUNTER — Encounter (HOSPITAL_COMMUNITY): Payer: Self-pay | Admitting: Emergency Medicine

## 2023-09-18 ENCOUNTER — Other Ambulatory Visit: Payer: Self-pay

## 2023-09-18 DIAGNOSIS — E039 Hypothyroidism, unspecified: Secondary | ICD-10-CM | POA: Diagnosis not present

## 2023-09-18 DIAGNOSIS — Z711 Person with feared health complaint in whom no diagnosis is made: Secondary | ICD-10-CM | POA: Insufficient documentation

## 2023-09-18 DIAGNOSIS — I10 Essential (primary) hypertension: Secondary | ICD-10-CM | POA: Insufficient documentation

## 2023-09-18 DIAGNOSIS — Z79899 Other long term (current) drug therapy: Secondary | ICD-10-CM | POA: Diagnosis not present

## 2023-09-18 DIAGNOSIS — R300 Dysuria: Secondary | ICD-10-CM | POA: Diagnosis present

## 2023-09-18 NOTE — Discharge Instructions (Signed)
Continue taking your medications as prescribed.  You will be contacted if we have to make a change to your antibiotics.  Return to the emergency department if you develop fever, chills or pain in your back.  Please follow-up with your primary care doctor this week.

## 2023-09-18 NOTE — ED Provider Notes (Signed)
[place] EMERGENCY DEPARTMENT AT [facility] Provider Note   CSN: [id number] Arrival date & time: 09/18/23  7253     History  Chief Complaint  Patient presents with   Dysuria    [name] is a 73 y.o. male.  HPI     This is a 73 year old male who presents requesting "checkup."  He was seen and evaluated by his primary doctor on 1/16 for dysuria.  He is being treated for urinary tract infection with Keflex.  He started antibiotics on Thursday.  He states that overall his dysuria has gone away and he feels somewhat better.  Denies back pain or fevers.  When asked what brought him to the emergency department this morning he states "I felt like I might need an IV or something."  He has no specific complaints.  He states that he just wanted to get checked out.  Denies nausea, vomiting, abdominal pain, chest pain  Unable to review any urine or urine culture results from this week.  Home Medications Prior to Admission medications   Medication Sig Start Date End Date Taking? Authorizing Provider  amLODipine (NORVASC) 10 MG tablet Take 10 mg by mouth daily. 09/09/22   [provider]  atorvastatin (LIPITOR) 20 MG tablet Take 1 tablet (20 mg total) by mouth daily. 10/23/16   Eustace Moore, MD  clindamycin (CLEOCIN T) 1 % external solution Apply 1 Application topically daily. Apply to back of neck    [provider]  colchicine 0.6 MG tablet Take one tablet three times a day for five days.  Stop if get diarrhea. 08/16/23   Darreld Mclean, MD  cyanocobalamin (VITAMIN B12) 1000 MCG tablet Take 1,000 mcg by mouth daily.    [provider]  febuxostat (ULORIC) 40 MG tablet Take 1 tablet (40 mg total) by mouth daily. 12/31/20   Darreld Mclean, MD  ferrous sulfate 325 (65 FE) MG tablet Take 325 mg by mouth daily.    [provider]  HYDROcodone-acetaminophen (NORCO) 7.5-325 MG tablet One tablet every six hours as needed for pain. 09/14/23   Darreld Mclean, MD  levothyroxine (SYNTHROID) 50 MCG tablet Take 50 mcg by mouth every morning. 09/09/22   [provider]  levothyroxine (SYNTHROID, LEVOTHROID) 200 MCG tablet Take 1 tablet (200 mcg total) by mouth daily before breakfast. 09/24/18   Shon Hale, MD  metoprolol succinate (TOPROL-XL) 25 MG 24 hr tablet Take 1.5 tablets (37.5 mg total) by mouth daily. 02/19/20   Antoine Poche, MD  omeprazole (PRILOSEC) 20 MG capsule TAKE 1 CAPSULE BY MOUTH ONCE DAILY. 11/19/17   Anice Paganini, NP  sildenafil (VIAGRA) 100 MG tablet Take 0.5 tablets (50 mg total) by mouth daily as needed. 10/23/22   McKenzie, [name], MD  tamsulosin (FLOMAX) 0.4 MG CAPS capsule Take 1 capsule (0.4 mg total) by mouth daily. 01/11/23   McKenzie, [name], MD  Vitamin D, Cholecalciferol, 25 MCG (1000 UT) TABS Take 1 tablet by mouth daily. 02/10/21   Doreatha Massed, MD      Allergies    Aspirin    Review of Systems   Review of Systems  Constitutional:  Negative for fever.  Respiratory:  Negative for shortness of breath.   Cardiovascular:  Negative for chest pain.  Gastrointestinal:  Negative for abdominal pain, nausea and vomiting.  Genitourinary:  Negative for dysuria, flank pain and frequency.  All other systems reviewed and are negative.   Physical Exam Updated Vital Signs BP  122/61 (BP Location: Right Arm)   Pulse 90   Temp 98.5 F (36.9 C) (Oral)   Resp 16   Ht 1.88 m (6\' 2" )   Wt 108.9 kg   SpO2 96%   BMI 30.81 kg/m  Physical Exam Vitals and nursing note reviewed.  Constitutional:      Appearance: He is well-developed.     Comments: Elderly, nontoxic  HENT:     Head: Normocephalic and atraumatic.  Eyes:     Pupils: Pupils are equal, round, and reactive to light.  Cardiovascular:     Rate and Rhythm: Normal rate and regular rhythm.     Heart sounds: Normal heart sounds.  Pulmonary:     Effort: Pulmonary effort is normal. No respiratory distress.     Breath sounds: Normal  breath sounds. No wheezing.  Abdominal:     Palpations: Abdomen is soft.     Tenderness: There is no abdominal tenderness. There is no rebound.  Musculoskeletal:     Cervical back: Neck supple.  Lymphadenopathy:     Cervical: No cervical adenopathy.  Skin:    General: Skin is warm and dry.  Neurological:     Mental Status: He is alert and oriented to person, place, and time.  Psychiatric:        Mood and Affect: Mood normal.     ED Results / Procedures / Treatments   Labs (all labs ordered are listed, but only abnormal results are displayed) Labs Reviewed  URINE CULTURE    EKG None  Radiology No results found.  Procedures Procedures    Medications Ordered in ED Medications - No data to display  ED Course/ Medical Decision Making/ A&P                                 Medical Decision Making Amount and/or Complexity of Data Reviewed Labs: ordered.   This patient presents to the ED for concern of ongoing UTI, this involves an extensive number of treatment options, and is a complaint that carries with it a high risk of complications and morbidity.  I considered the following differential and admission for this acute, potentially life threatening condition.  The differential diagnosis includes progressive UTI, improving UTI, worried well  MDM:    This is a 73 year old male who presents requesting a check after being diagnosed with a UTI 2 days ago.  Currently on antibiotics.  Vital signs are normal.  He is afebrile.  He is currently without any specific complaint.  Overall his physical exam is reassuring.  I have reassured him that I do not feel he has an acute emergent process.  Given that I cannot see his urinalysis or results from his primary physician, I will send a urine culture to have processing.  Encouraged him to continue his antibiotics.  (Labs, imaging, consults)  Labs: I Ordered, and personally interpreted labs.  The pertinent results include:  None  Imaging Studies ordered: I ordered imaging studies including none I independently visualized and interpreted imaging. I agree with the radiologist interpretation  Additional history obtained from chart review.  External records from outside source obtained and reviewed including prior evaluations  Cardiac Monitoring: The patient was not maintained on a cardiac monitor.  If on the cardiac monitor, I personally viewed and interpreted the cardiac monitored which showed an underlying rhythm of: N/A  Reevaluation: After the interventions noted above, I reevaluated the patient and found  that they have :stayed the same  Social Determinants of Health:  lives independently  Disposition: Discharge  Co morbidities that complicate the patient evaluation  Past Medical History:  Diagnosis Date   Anemia    FeDA: GASTRIC POLYPS, B12; TCS 2008 EGD 2009, 2008-HB 11.1 MCV 83.6 CR 1.22, 2009 FERRITIN 102-22   B12 deficiency    Carcinoid tumor determined by biopsy of stomach 09/25/2014   Chronic knee pain    Diverticulosis of colon    Lower GI bleed 2008   Essential hypertension    GERD (gastroesophageal reflux disease)    Gout    Hepatomegaly    Hepatic steatosis   History of alcohol abuse    History of septic arthritis    Hypothyroidism    PUD    Substance abuse (HCC)      Medicines No orders of the defined types were placed in this encounter.   I have reviewed the patients home medicines and have made adjustments as needed  Problem List / ED Course: Problem List Items Addressed This Visit   None Visit Diagnoses       Physically well but worried    -  Primary                   Final Clinical Impression(s) / ED Diagnoses Final diagnoses:  Physically well but worried    Rx / DC Orders ED Discharge Orders     None         Jazmynn Pho, Mayer Masker, MD 09/18/23 [phone]

## 2023-09-18 NOTE — ED Triage Notes (Signed)
Patient coming to ED for evaluation of "a urinary infection."  States he was seen by his provider on 1/16 for symptoms and prescribed Cephalexin 500 mg.  No current complaints of pain, dysuria, hematuria, or urinary symptoms.  States "I think I need an IV or something."  Has been taking oral prescriptions has prescribed.  No back pain, no abdominal pain, no fevers.

## 2023-09-18 NOTE — ED Notes (Signed)
Patient reports he is not able to provide a urine sample at this time.  States he used the restroom prior to being roomed.  Has attempted drinking water without success of providing a sample at this time.

## 2023-09-19 LAB — URINE CULTURE: Culture: NO GROWTH

## 2023-09-22 ENCOUNTER — Inpatient Hospital Stay: Payer: 59

## 2023-09-29 ENCOUNTER — Ambulatory Visit: Payer: 59 | Admitting: Hematology

## 2023-09-29 ENCOUNTER — Inpatient Hospital Stay: Payer: 59 | Admitting: Physician Assistant

## 2023-10-06 ENCOUNTER — Other Ambulatory Visit: Payer: Self-pay

## 2023-10-06 ENCOUNTER — Emergency Department (HOSPITAL_COMMUNITY): Payer: 59

## 2023-10-06 ENCOUNTER — Emergency Department (HOSPITAL_COMMUNITY)
Admission: EM | Admit: 2023-10-06 | Discharge: 2023-10-06 | Disposition: A | Discharge status: Home / Self Care | Payer: 59 | Attending: Emergency Medicine | Admitting: Emergency Medicine

## 2023-10-06 ENCOUNTER — Encounter (HOSPITAL_COMMUNITY): Payer: Self-pay

## 2023-10-06 DIAGNOSIS — I471 Supraventricular tachycardia, unspecified: Secondary | ICD-10-CM | POA: Insufficient documentation

## 2023-10-06 DIAGNOSIS — R0602 Shortness of breath: Secondary | ICD-10-CM | POA: Diagnosis not present

## 2023-10-06 DIAGNOSIS — Z79899 Other long term (current) drug therapy: Secondary | ICD-10-CM | POA: Diagnosis not present

## 2023-10-06 DIAGNOSIS — Z20822 Contact with and (suspected) exposure to covid-19: Secondary | ICD-10-CM | POA: Diagnosis not present

## 2023-10-06 DIAGNOSIS — R531 Weakness: Secondary | ICD-10-CM

## 2023-10-06 DIAGNOSIS — E039 Hypothyroidism, unspecified: Secondary | ICD-10-CM | POA: Diagnosis not present

## 2023-10-06 DIAGNOSIS — Z8546 Personal history of malignant neoplasm of prostate: Secondary | ICD-10-CM | POA: Insufficient documentation

## 2023-10-06 DIAGNOSIS — I10 Essential (primary) hypertension: Secondary | ICD-10-CM | POA: Insufficient documentation

## 2023-10-06 LAB — CBC WITH DIFFERENTIAL/PLATELET
Abs Immature Granulocytes: 0.05 10*3/uL (ref 0.00–0.07)
Basophils Absolute: 0 10*3/uL (ref 0.0–0.1)
Basophils Relative: 0 %
Eosinophils Absolute: 0 10*3/uL (ref 0.0–0.5)
Eosinophils Relative: 0 %
HCT: 36.9 % — ABNORMAL LOW (ref 39.0–52.0)
Hemoglobin: 12.2 g/dL — ABNORMAL LOW (ref 13.0–17.0)
Immature Granulocytes: 0 %
Lymphocytes Relative: 17 %
Lymphs Abs: 2 10*3/uL (ref 0.7–4.0)
MCH: 32.4 pg (ref 26.0–34.0)
MCHC: 33.1 g/dL (ref 30.0–36.0)
MCV: 98.1 fL (ref 80.0–100.0)
Monocytes Absolute: 1.5 10*3/uL — ABNORMAL HIGH (ref 0.1–1.0)
Monocytes Relative: 13 %
Neutro Abs: 7.9 10*3/uL — ABNORMAL HIGH (ref 1.7–7.7)
Neutrophils Relative %: 70 %
Platelets: 125 10*3/uL — ABNORMAL LOW (ref 150–400)
RBC: 3.76 MIL/uL — ABNORMAL LOW (ref 4.22–5.81)
RDW: 14 % (ref 11.5–15.5)
WBC: 11.5 10*3/uL — ABNORMAL HIGH (ref 4.0–10.5)
nRBC: 0 % (ref 0.0–0.2)

## 2023-10-06 LAB — T4, FREE: Free T4: 2.25 ng/dL — ABNORMAL HIGH (ref 0.61–1.12)

## 2023-10-06 LAB — MAGNESIUM: Magnesium: 1.4 mg/dL — ABNORMAL LOW (ref 1.7–2.4)

## 2023-10-06 LAB — BASIC METABOLIC PANEL
Anion gap: 12 (ref 5–15)
BUN: 12 mg/dL (ref 8–23)
CO2: 26 mmol/L (ref 22–32)
Calcium: 9.1 mg/dL (ref 8.9–10.3)
Chloride: 95 mmol/L — ABNORMAL LOW (ref 98–111)
Creatinine, Ser: 1.24 mg/dL (ref 0.61–1.24)
GFR, Estimated: >60 mL/min (ref >60)
Glucose, Bld: 109 mg/dL — ABNORMAL HIGH (ref 70–99)
Potassium: 3.8 mmol/L (ref 3.5–5.1)
Sodium: 133 mmol/L — ABNORMAL LOW (ref 135–145)

## 2023-10-06 LAB — TROPONIN I (HIGH SENSITIVITY)
Troponin I (High Sensitivity): 7 ng/L (ref <18)
Troponin I (High Sensitivity): 7 ng/L (ref <18)

## 2023-10-06 LAB — RESP PANEL BY RT-PCR (RSV, FLU A&B, COVID)  RVPGX2
Influenza A by PCR: NEGATIVE
Influenza B by PCR: NEGATIVE
Resp Syncytial Virus by PCR: NEGATIVE
SARS Coronavirus 2 by RT PCR: NEGATIVE

## 2023-10-06 LAB — TSH: TSH: 0.58 u[IU]/mL (ref 0.350–4.500)

## 2023-10-06 MED ORDER — METOPROLOL TARTRATE 25 MG PO TABS
25.0000 mg | ORAL_TABLET | Freq: Once | ORAL | Status: AC
  Administered 2023-10-06: 25 mg via ORAL
  Filled 2023-10-06: qty 1

## 2023-10-06 MED ORDER — METOPROLOL TARTRATE 5 MG/5ML IV SOLN
2.5000 mg | INTRAVENOUS | Status: AC
  Administered 2023-10-06: 2.5 mg via INTRAVENOUS
  Filled 2023-10-06: qty 5

## 2023-10-06 MED ORDER — MAGNESIUM SULFATE 2 GM/50ML IV SOLN
2.0000 g | Freq: Once | INTRAVENOUS | Status: AC
  Administered 2023-10-06: 2 g via INTRAVENOUS
  Filled 2023-10-06: qty 50

## 2023-10-06 MED ORDER — POTASSIUM CHLORIDE CRYS ER 20 MEQ PO TBCR
40.0000 meq | EXTENDED_RELEASE_TABLET | Freq: Once | ORAL | Status: AC
  Administered 2023-10-06: 40 meq via ORAL
  Filled 2023-10-06: qty 2

## 2023-10-06 MED ORDER — LACTATED RINGERS IV BOLUS
1000.0000 mL | Freq: Once | INTRAVENOUS | Status: AC
  Administered 2023-10-06: 1000 mL via INTRAVENOUS

## 2023-10-06 NOTE — Discharge Instructions (Signed)
 You were seen for your weakness in the emergency department. It may be related to your heart rate being elevated so please take the metoprolol  you were prescribed daily.   At home, please stay well hydrated.    Check your MyChart online for the results of any tests that had not resulted by the time you left the emergency department.   Follow-up with your primary doctor in 2-3 days regarding your visit.    Return immediately to the emergency department if you experience any of the following: chest pains, palpitations, or any other concerning symptoms.    Thank you for visiting our Emergency Department. It was a pleasure taking care of you today.

## 2023-10-06 NOTE — ED Triage Notes (Signed)
 Pt arrived via POV c/o generalized weakness for the past week. Pt presents in NAD. Pt reports "I just don't feel good."

## 2023-10-06 NOTE — ED Provider Notes (Signed)
 [place] EMERGENCY DEPARTMENT AT [facility] Provider Note   CSN: [id number] Arrival date & time: 10/06/23  9045     History  Chief Complaint  Patient presents with   Weakness    [name] is a 73 y.o. male.  73 year old male with a history of prostate cancer, gout, hypothyroidism, EtOH abuse, and hypertension who presents emergency department generalized weakness.  Patient states that over the past week he has been feeling fatigued.  Says that he has had a mild dry cough.  Denies any other symptoms.  No palpitations.  No chest pain or shortness of breath.  Has not drank alcohol in 3 weeks.  No other recreational drug use.  Says he is not currently being treated for his prostate cancer. Denies any urinary symptoms now.        Home Medications Prior to Admission medications   Medication Sig Start Date End Date Taking? Authorizing Provider  amLODipine  (NORVASC ) 10 MG tablet Take 10 mg by mouth daily. 09/09/22  Yes [provider]  atorvastatin  (LIPITOR) 20 MG tablet Take 1 tablet (20 mg total) by mouth daily. 10/23/16  Yes Maranda Jamee Jacob, MD  clindamycin  (CLEOCIN  T) 1 % external solution Apply 1 Application topically daily. Apply to back of neck   Yes [provider]  colchicine  0.6 MG tablet Take one tablet three times a day for five days.  Stop if get diarrhea. 08/16/23  Yes Brenna Lin, MD  cyanocobalamin  (VITAMIN B12) 1000 MCG tablet Take 1,000 mcg by mouth daily.   Yes [provider]  ferrous sulfate 325 (65 FE) MG tablet Take 325 mg by mouth daily.   Yes [provider]  HYDROcodone -acetaminophen  (NORCO) 7.5-325 MG tablet One tablet every six hours as needed for pain. 09/14/23  Yes Brenna Lin, MD  levothyroxine  (SYNTHROID ) 50 MCG tablet Take 50 mcg by mouth every morning. 09/09/22  Yes [provider]  levothyroxine  (SYNTHROID , LEVOTHROID) 200 MCG tablet Take 1 tablet (200 mcg total) by mouth daily before  breakfast. 09/24/18  Yes Emokpae, Courage, MD  metoprolol  succinate (TOPROL -XL) 25 MG 24 hr tablet Take 1.5 tablets (37.5 mg total) by mouth daily. 02/19/20  Yes Branch, Dorn FALCON, MD  omeprazole  (PRILOSEC) 20 MG capsule TAKE 1 CAPSULE BY MOUTH ONCE DAILY. 11/19/17  Yes Marvis Camellia LABOR, NP  potassium chloride  (MICRO-K ) 10 MEQ CR capsule Take 10 mEq by mouth daily. 09/16/23  Yes [provider]  sildenafil  (VIAGRA ) 100 MG tablet Take 0.5 tablets (50 mg total) by mouth daily as needed. 10/23/22  Yes McKenzie, Belvie CROME, MD  tamsulosin  (FLOMAX ) 0.4 MG CAPS capsule Take 1 capsule (0.4 mg total) by mouth daily. 01/11/23  Yes McKenzie, Belvie CROME, MD  Vitamin D , Cholecalciferol, 25 MCG (1000 UT) TABS Take 1 tablet by mouth daily. 02/10/21  Yes Rogers Hai, MD      Allergies    Aspirin    Review of Systems   Review of Systems  Physical Exam Updated Vital Signs BP 107/76   Pulse 92   Temp 99.2 F (37.3 C) (Oral)   Resp 20   Ht 6' 2 (1.88 m)   Wt 108.8 kg   SpO2 95%   BMI 30.80 kg/m  Physical Exam Vitals and nursing note reviewed.  Constitutional:      General: He is not in acute distress.    Appearance: He is well-developed.  HENT:     Head: Normocephalic and atraumatic.     Right Ear: External  ear normal.     Left Ear: External ear normal.     Nose: Nose normal.  Eyes:     Extraocular Movements: Extraocular movements intact.     Conjunctiva/sclera: Conjunctivae normal.     Pupils: Pupils are equal, round, and reactive to light.  Cardiovascular:     Rate and Rhythm: Regular rhythm. Tachycardia present.     Heart sounds: Normal heart sounds.  Pulmonary:     Effort: Pulmonary effort is normal. No respiratory distress.     Breath sounds: Normal breath sounds.  Abdominal:     General: There is no distension.     Palpations: Abdomen is soft. There is no mass.     Tenderness: There is no abdominal tenderness. There is no guarding.  Musculoskeletal:     Cervical back:  Normal range of motion and neck supple.     Right lower leg: No edema.     Left lower leg: No edema.  Skin:    General: Skin is warm and dry.  Neurological:     Mental Status: He is alert. Mental status is at baseline.  Psychiatric:        Mood and Affect: Mood normal.        Behavior: Behavior normal.     ED Results / Procedures / Treatments   Labs (all labs ordered are listed, but only abnormal results are displayed) Labs Reviewed  CBC WITH DIFFERENTIAL/PLATELET - Abnormal; Notable for the following components:      Result Value   WBC 11.5 (*)    RBC 3.76 (*)    Hemoglobin 12.2 (*)    HCT 36.9 (*)    Platelets 125 (*)    Neutro Abs 7.9 (*)    Monocytes Absolute 1.5 (*)    All other components within normal limits  BASIC METABOLIC PANEL - Abnormal; Notable for the following components:   Sodium 133 (*)    Chloride 95 (*)    Glucose, Bld 109 (*)    All other components within normal limits  MAGNESIUM  - Abnormal; Notable for the following components:   Magnesium  1.4 (*)    All other components within normal limits  T4, FREE - Abnormal; Notable for the following components:   Free T4 2.25 (*)    All other components within normal limits  RESP PANEL BY RT-PCR (RSV, FLU A&B, COVID)  RVPGX2  TSH  TROPONIN I (HIGH SENSITIVITY)  TROPONIN I (HIGH SENSITIVITY)    EKG EKG Interpretation Date/Time:  Wednesday October 06 2023 11:24:35 EST Ventricular Rate:  111 PR Interval:  155 QRS Duration:  72 QT Interval:  327 QTC Calculation: 445 R Axis:   -22  Text Interpretation: Sinus tachycardia Supraventricular bigeminy Borderline left axis deviation Nonspecific T abnrm, anterolateral leads Confirmed by Yolande Charleston [phone]) on 10/06/2023 12:05:53 PM    Radiology DG Chest 2 View Result Date: 10/06/2023 CLINICAL DATA:  Shortness of breath. EXAM: CHEST - 2 VIEW COMPARISON:  06/05/2021. FINDINGS: Bilateral lung fields are clear. Bilateral costophrenic angles are clear. Elevated  right hemidiaphragm noted. Normal cardio-mediastinal silhouette. No acute osseous abnormalities. The soft tissues are within normal limits. IMPRESSION: No active cardiopulmonary disease. Electronically Signed   By: Ree Molt M.D.   On: 10/06/2023 13:23    Procedures Procedures    Medications Ordered in ED Medications  lactated ringers  bolus 1,000 mL (0 mLs Intravenous Stopped 10/06/23 1248)  metoprolol  tartrate (LOPRESSOR ) injection 2.5 mg (2.5 mg Intravenous Given 10/06/23 1131)  magnesium  sulfate IVPB  2 g 50 mL (0 g Intravenous Stopped 10/06/23 1350)  potassium chloride  SA (KLOR-CON  M) CR tablet 40 mEq (40 mEq Oral Given 10/06/23 1249)  metoprolol  tartrate (LOPRESSOR ) tablet 25 mg (25 mg Oral Given 10/06/23 1349)    ED Course/ Medical Decision Making/ A&P                                 Medical Decision Making Amount and/or Complexity of Data Reviewed Labs: ordered. Radiology: ordered.  Risk Prescription drug management.   [name] is a 73 y.o. male with comorbidities that complicate the patient evaluation including prostate cancer, gout, hypothyroidism, EtOH abuse, and hypertension who presents emergency department generalized weakness.    Initial Ddx:  Dehydration, infection, sepsis, URI, SVT, MI, heart failure  MDM/Course:  Patient presents emergency department generalized weakness for the past week.  Does not have any specific infectious symptoms at this time.  On arrival was initially tachycardic and hypotensive.  Appears to be in SVT which she has a history of.  Is on metoprolol  for this but has not taken his medication in several days.  Unclear if he had an illness that caused him to stop taking his medication or if he stopped taking his medication and then started feeling poorly.  Was given IV and oral metoprolol  with improvement of his heart rate.  Also given some IV fluids and his blood pressure normalized.  Labs show that his magnesium  and potassium were slightly low and  they were replenished.  COVID and flu negative.  TSH was WNL.  Free T4 was marginally elevated but I suspect that this is not clinically significant or causing his symptoms.  Chest x-ray without evidence of pneumonia.  Upon re-evaluation patient is feeling much better and is back to baseline.  Feel it is suitable for outpatient follow-up.  Counseled on the importance of taking his medications.  This patient presents to the ED for concern of complaints listed in HPI, this involves an extensive number of treatment options, and is a complaint that carries with it a high risk of complications and morbidity. Disposition including potential need for admission considered.   Dispo: DC Home. Return precautions discussed including, but not limited to, those listed in the AVS. Allowed pt time to ask questions which were answered fully prior to dc.  Records reviewed Outpatient Clinic Notes The following labs were independently interpreted: Chemistry and show no acute abnormality I independently reviewed the following imaging with scope of interpretation limited to determining acute life threatening conditions related to emergency care: Chest x-ray and agree with the radiologist interpretation with the following exceptions: none I personally reviewed and interpreted cardiac monitoring: normal sinus rhythm  and SVT I personally reviewed and interpreted the pt's EKG: see above for interpretation  I have reviewed the patients home medications and made adjustments as needed Social Determinants of health:  Elderly  Portions of this note were generated with Scientist, clinical (histocompatibility and immunogenetics). Dictation errors may occur despite best attempts at proofreading.    CRITICAL CARE Performed by: Lamar JAYSON Shan   Total critical care time: 30 minutes  Critical care time was exclusive of separately billable procedures and treating other patients.  Critical care was necessary to treat or prevent imminent or life-threatening  deterioration.  Critical care was time spent personally by me on the following activities: development of treatment plan with patient and/or surrogate as well as nursing, discussions with consultants, evaluation  of patient's response to treatment, examination of patient, obtaining history from patient or surrogate, ordering and performing treatments and interventions, ordering and review of laboratory studies, ordering and review of radiographic studies, pulse oximetry and re-evaluation of patient's condition.   Final Clinical Impression(s) / ED Diagnoses Final diagnoses:  Paroxysmal SVT (supraventricular tachycardia) (HCC)  Generalized weakness    Rx / DC Orders ED Discharge Orders     None         Yolande Lamar BROCKS, MD 10/06/23 [phone]

## 2023-10-14 ENCOUNTER — Encounter: Payer: Self-pay | Admitting: Orthopaedic Surgery

## 2023-10-14 ENCOUNTER — Ambulatory Visit: Payer: 59 | Admitting: Orthopaedic Surgery

## 2023-10-14 ENCOUNTER — Other Ambulatory Visit: Payer: Self-pay | Admitting: Orthopaedic Surgery

## 2023-10-14 VITALS — BP 109/65 | HR 87 | Ht 74.0 in | Wt 199.0 lb

## 2023-10-14 DIAGNOSIS — G894 Chronic pain syndrome: Secondary | ICD-10-CM | POA: Diagnosis not present

## 2023-10-14 DIAGNOSIS — M1A0411 Idiopathic chronic gout, right hand, with tophus (tophi): Secondary | ICD-10-CM

## 2023-10-14 MED ORDER — HYDROCODONE-ACETAMINOPHEN 7.5-325 MG PO TABS: ORAL_TABLET | ORAL | 0 refills | Status: DC

## 2023-10-14 NOTE — Progress Notes (Signed)
My gout is good.  He has chronic gout.  He has had an attack about a month ago.  He is doing well now.  He has tophus of the right wrist.  He is taking his medicine.  ROM of the right wrist is good.  He has a dorsal radial gout tophus.  NV intact.  Encounter Diagnoses  Name Primary?   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  Return in three months.  Continue the [place].  Call if any problem.  Precautions discussed.  Electronically Signed Darreld Mclean, MD 2/13/20259:54 AM

## 2023-10-25 ENCOUNTER — Inpatient Hospital Stay: Payer: 59

## 2023-10-26 ENCOUNTER — Inpatient Hospital Stay: Payer: 59 | Attending: Hematology

## 2023-10-26 DIAGNOSIS — D508 Other iron deficiency anemias: Secondary | ICD-10-CM

## 2023-10-26 DIAGNOSIS — C61 Malignant neoplasm of prostate: Secondary | ICD-10-CM | POA: Diagnosis not present

## 2023-10-26 DIAGNOSIS — D3A8 Other benign neuroendocrine tumors: Secondary | ICD-10-CM

## 2023-10-26 DIAGNOSIS — C7A092 Malignant carcinoid tumor of the stomach: Secondary | ICD-10-CM | POA: Insufficient documentation

## 2023-10-26 LAB — CBC WITH DIFFERENTIAL/PLATELET
Abs Immature Granulocytes: 0.01 10*3/uL (ref 0.00–0.07)
Basophils Absolute: 0 10*3/uL (ref 0.0–0.1)
Basophils Relative: 1 %
Eosinophils Absolute: 0.1 10*3/uL (ref 0.0–0.5)
Eosinophils Relative: 2 %
HCT: 31.9 % — ABNORMAL LOW (ref 39.0–52.0)
Hemoglobin: 10.2 g/dL — ABNORMAL LOW (ref 13.0–17.0)
Immature Granulocytes: 0 %
Lymphocytes Relative: 36 %
Lymphs Abs: 1.4 10*3/uL (ref 0.7–4.0)
MCH: 31.8 pg (ref 26.0–34.0)
MCHC: 32 g/dL (ref 30.0–36.0)
MCV: 99.4 fL (ref 80.0–100.0)
Monocytes Absolute: 0.5 10*3/uL (ref 0.1–1.0)
Monocytes Relative: 11 %
Neutro Abs: 2 10*3/uL (ref 1.7–7.7)
Neutrophils Relative %: 50 %
Platelets: 95 10*3/uL — ABNORMAL LOW (ref 150–400)
RBC: 3.21 MIL/uL — ABNORMAL LOW (ref 4.22–5.81)
RDW: 15.1 % (ref 11.5–15.5)
WBC: 4 10*3/uL (ref 4.0–10.5)
nRBC: 0 % (ref 0.0–0.2)

## 2023-10-26 LAB — COMPREHENSIVE METABOLIC PANEL
ALT: 41 U/L (ref 0–44)
AST: 62 U/L — ABNORMAL HIGH (ref 15–41)
Albumin: 3.3 g/dL — ABNORMAL LOW (ref 3.5–5.0)
Alkaline Phosphatase: 83 U/L (ref 38–126)
Anion gap: 7 (ref 5–15)
BUN: 16 mg/dL (ref 8–23)
CO2: 25 mmol/L (ref 22–32)
Calcium: 9.1 mg/dL (ref 8.9–10.3)
Chloride: 104 mmol/L (ref 98–111)
Creatinine, Ser: 1.17 mg/dL (ref 0.61–1.24)
GFR, Estimated: >60 mL/min (ref >60)
Glucose, Bld: 107 mg/dL — ABNORMAL HIGH (ref 70–99)
Potassium: 4.7 mmol/L (ref 3.5–5.1)
Sodium: 136 mmol/L (ref 135–145)
Total Bilirubin: 1 mg/dL (ref 0.0–1.2)
Total Protein: 7.5 g/dL (ref 6.5–8.1)

## 2023-10-26 LAB — IRON AND TIBC
Iron: 92 ug/dL (ref 45–182)
Saturation Ratios: 36 % (ref 17.9–39.5)
TIBC: 257 ug/dL (ref 250–450)
UIBC: 165 ug/dL

## 2023-10-26 LAB — FERRITIN: Ferritin: 478 ng/mL — ABNORMAL HIGH (ref 24–336)

## 2023-10-26 LAB — PSA: Prostatic Specific Antigen: 0.78 ng/mL (ref 0.00–4.00)

## 2023-10-28 LAB — CHROMOGRANIN A: Chromogranin A (ng/mL): 215.5 ng/mL — ABNORMAL HIGH (ref 0.0–101.8)

## 2023-10-30 NOTE — Progress Notes (Unsigned)
 [facility] [address], [place] [address]   CLINIC:  Medical Oncology/Hematology  PCP:  Philip Pearson, FNP [address] [name] [place] [address] [phone]   REASON FOR VISIT:  Follow-up for neuroendocrine carcinoid tumor  CURRENT THERAPY: Surveillance  INTERVAL HISTORY:   [name] 73 y.o. male returns for routine follow-up of neuroendocrine carcinoid tumor.  He was last seen by Dr. Ellin King on 03/29/2023.  At today's visit, he reports feeling fairly well.  No recent hospitalizations, surgeries, or changes in baseline health status.  He is asymptomatic without any nausea/vomiting, abdominal pain, decreased appetite, flushing, wheezing, heart palpations, constipation, diarrhea.  He "does not like getting EGD done," and has not followed up with Dr. Darrick King since 2017.  He denies any rectal bleeding or melena.  No abnormal bruising.  He denies any B symptoms such as fever, chills, night sweats, unintentional weight loss.  He reports that he drinks "about 2 drinks" on the weekends.  He has 75% energy and 100% appetite. He endorses that he is maintaining a stable weight.  ASSESSMENT & PLAN:  1.  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 Darrick King on 08/14/2014: Gastric polyp showed low-grade neuroendocrine tumor.  Stomach biopsy showed focal involvement by low-grade neuroendocrine tumor. - Based on pathology from 08/14/2014, there was concern of residual disease or more extensive disease within his stomach.  Octreotide scan on 09/19/2014 was negative for any avid metastases. - EGD with gastric biopsy on 03/12/2015 was negative for dysplasia or malignancy. - EGD with polypectomy by Dr. Darrick King on 10/08/2015: Gastric polyp with well-differentiated neuroendocrine tumor  - His case was presented at GI tumor board in Feb/March 2016 with recommendation for anatomic biopsies to guide role of surgical  resection.  Anatomic biopsies throughout stomach on 04/21/2016 were negative. - Chromogranin A elevated since January 2020 (186.0).  Gradually trending upward with most recent chromogranin A elevated at 215.5.   (Chromogranin A was within normal limits from June 2016 through January 2019). - Following elevated chromogranin levels in January 2020, Dr. Darrick King was aware of elevated levels and advised EGD after having seen patient in consult.   Patient refused at that time.   - He does not currently have any signs or symptoms of carcinoid syndrome. - Most recent labs (10/26/2023): Chromogranin A elevated 215.5.  Mildly elevated AST is stable. - Per discussion with Dr. Ellin King, chromogranin A can be falsely elevated in patients on PPI, however patient is not taking any PPI or other acid blocking medication.  Ideally, patient will agree to EGD.  Otherwise, could consider checking 24-hour urine 5 HIAA, or checking dotatate PET scan if significantly increased chromogranin A or symptomatic. - PLAN:  Discussed with patient that low-grade gastric neuroendocrine tumors have a favorable prognosis when managed appropriately.  However, untreated disease could have worse prognosis due to increased risk of metastatic disease. - Patient is agreeable with referral to GI to discuss EGD, but request that I call and discussed with his brother [name]) "to get his opinion too" (see separate documentation of telephone encounter).  Referral entered to [facility]. - Repeat LFTs and chromogranin A in 6 months, unless any new abnormalities on EGD above.  2.  Normocytic anemia + thrombocytopenia - Mild intermittent anemia since at least 2020.  Mild to moderate intermittent thrombocytopenia since at least 2010, with episode of severe thrombocytopenia during hospitalization in 2008. - CT abdomen/pelvis (04/24/2019): No focal  liver abnormality.  Normal-sized spleen. - Most recent CBC/D (10/26/2023):  Hgb 10.2/MCV 99.4.  Platelets 95.  WBC 4.0/normal differential. Ferritin 478, iron saturation 36% Creatinine 1.17 with GFR >60 - He denies any rectal bleeding or melena.  No abnormal bruising.  He denies any B symptoms such as fever, chills, night sweats, unintentional weight loss.  He reports that he drinks "about 2 drinks" on the weekends. - He is at risk for nutritional deficiencies due to alcohol consumption. - PLAN: Recommend additional labs for workup of anemia and thrombocytopenia.  Will check additional labs today for workup of anemia and thrombocytopenia. - RTC in 1 month to review results  - Depending on workup above, would consider additional testing such as abdominal ultrasound, infectious disease screening, and/or autoimmune disease screening.  3.  Pulmonary nodule: - 6 mm LUL pulmonary nodule on neck PET scan done on 10/18/2018. - CT chest from September 2020 revealed stable left lung nodule. - Repeat CT chest from 07/11/2020 showed interval development of multiple patchy areas of groundglass attenuation scattered throughout both lungs.  Other nonspecific these are favored to represent sequelae of inflammation or infection.  Atypical viral infection not excluded.  This is likely related to recent COVID-19 diagnosis.  The 7 mm pulmonary nodule remains stable.  Thought to be a benign perifissural lymph node.  4.  Prostate cancer: - Prostate biopsy (03/08/2023): Right lateral base Gleason 3+4=7, grade group 2, 5% of 1 core, PSA 6.0 - Placement of SpaceOAR by Dr. Ronne King on 06/10/2023.  Completed prostatic radiation with Dr. Langston King in [place] from 06/30/2023 through 08/10/2023 - Most recent PSA (10/26/2023) within normal limits at 0.78 - PLAN: Continue follow-up with urology and radiation oncology   PLAN SUMMARY: >> Referral has been entered for [facility] for EGD (history of gastric neuroendocrine carcinoid tumor) >> Labs today = Copper, folate, immature platelet  fraction, immunofixation, light chains, LDH, MMA, SPEP, reticulocytes, B12 >> Same day lab (CBC/D) + OFFICE visit in 4-6 weeks to discuss lab results     REVIEW OF SYSTEMS:   Review of Systems  Constitutional:  Negative for appetite change, chills, diaphoresis, fatigue, fever and unexpected weight change.  HENT:   Negative for lump/mass and nosebleeds.   Eyes:  Negative for eye problems.  Respiratory:  Negative for cough, hemoptysis and shortness of breath.   Cardiovascular:  Negative for chest pain, leg swelling and palpitations.  Gastrointestinal:  Negative for abdominal pain, blood in stool, constipation, diarrhea, nausea and vomiting.  Genitourinary:  Negative for hematuria.   Skin: Negative.   Neurological:  Negative for dizziness, headaches and light-headedness.  Hematological:  Does not bruise/bleed easily.     PHYSICAL EXAM:  ECOG PERFORMANCE STATUS: 0 - Asymptomatic  Vitals:   11/01/23 0913  BP: 108/72  Pulse: 77  Resp: 18  Temp: (!) 97 F (36.1 C)  SpO2: 100%   Filed Weights   11/01/23 0913  Weight: 199 lb (90.3 kg)   Physical Exam Vitals reviewed.  Constitutional:      Appearance: Normal appearance.  Cardiovascular:     Rate and Rhythm: Regular rhythm.     Heart sounds: Normal heart sounds.  Pulmonary:     Breath sounds: Wheezing (faint wheezing of LLL) present.  Neurological:     Mental Status: He is alert.  Psychiatric:        Mood and Affect: Mood normal.        Behavior: Behavior normal.    PAST MEDICAL/SURGICAL HISTORY:  Past Medical  History:  Diagnosis Date   Anemia    FeDA: GASTRIC POLYPS, B12; TCS 2008 EGD 2009, 2008-HB 11.1 MCV 83.6 CR 1.22, 2009 FERRITIN 102-22   B12 deficiency    Carcinoid tumor determined by biopsy of stomach 09/25/2014   Chronic knee pain    Diverticulosis of colon    Lower GI bleed 2008   Essential hypertension    GERD (gastroesophageal reflux disease)    Gout    Hepatomegaly    Hepatic steatosis   History of  alcohol abuse    History of septic arthritis    Hypothyroidism    PUD    Substance abuse (HCC)    Past Surgical History:  Procedure Laterality Date   BIOPSY  08/14/2014   Procedure: BIOPSY;  Surgeon: West Bali, MD;  Location: [facility];  Service: Endoscopy;;   BIOPSY  03/12/2015   Procedure: BIOPSY;  Surgeon: West Bali, MD;  Location: [facility];  Service: Endoscopy;;   BIOPSY  04/21/2016   Procedure: BIOPSY;  Surgeon: West Bali, MD;  [facility]: [facility];  Service: Endoscopy;;  bx's of antrum, body of stomach, fundus, and cardia   CHOLECYSTECTOMY     COLONOSCOPY  2008 [facility] DJ   LGIB 2o to TICS, prep good   COLONOSCOPY WITH PROPOFOL N/A 08/14/2014   SLF: 1. Four large colon polyps removed. 2. The left colon is redundant 3. Moderate diverticulosis throughout teh entire examined colon   COLONOSCOPY WITH PROPOFOL N/A 11/06/2014   SLF: 8 small polyps removed. One large pedunculated polyp removed from the ascending colon, tubulovillous and tubular adenomas. Next colonoscopy March 2019   COLONOSCOPY WITH PROPOFOL N/A 01/04/2018   Procedure: COLONOSCOPY WITH PROPOFOL;  Surgeon: West Bali, MD;  [facility];  Service: Endoscopy;  Laterality: N/A;  10:00am   ESOPHAGOGASTRODUODENOSCOPY  12/08/2007   ZOX:WRUE gastric polyps seen in the cardia and body of the stomach/Normal esophagus without evidence of Barrett's, mass, erosion/Normal duodenal bulb and second portion of the duodenum. Benign bx.   ESOPHAGOGASTRODUODENOSCOPY (EGD) WITH PROPOFOL N/A 08/14/2014   SLF: 1. Heme postive stools due to gastric and colon polyps 2. Multiple gastric    ESOPHAGOGASTRODUODENOSCOPY (EGD) WITH PROPOFOL N/A 03/12/2015   SLF: Multiple gastric nodules seen in gasric body/antrum. 2. Non-erosive gastritis ( inflammation) was found in the gastric antrum.    ESOPHAGOGASTRODUODENOSCOPY (EGD) WITH PROPOFOL N/A 10/08/2015   Procedure: ESOPHAGOGASTRODUODENOSCOPY (EGD) WITH PROPOFOL;  Surgeon: West Bali, MD;  [facility];  Service: Endoscopy;  Laterality: N/A;  0945   ESOPHAGOGASTRODUODENOSCOPY (EGD) WITH PROPOFOL N/A 04/21/2016   Procedure: ESOPHAGOGASTRODUODENOSCOPY (EGD) WITH PROPOFOL;  Surgeon: West Bali, MD;  [facility]: [facility];  Service: Endoscopy;  Laterality: N/A;  730    GOLD SEED IMPLANT N/A 06/10/2023   Procedure: GOLD SEED IMPLANT;  Surgeon: Malen Gauze, MD;  Location: [facility];  Service: Urology;  Laterality: N/A;   HAND SURGERY Right    fracture repair with plates   INGUINAL HERNIA REPAIR Left 11/09/2018   Procedure: HERNIA REPAIR INGUINAL ADULT WITH MESH;  Surgeon: Lucretia Roers, MD;  Location: [facility];  Service: General;  Laterality: Left;   JOINT REPLACEMENT Bilateral    knees   POLYPECTOMY  08/14/2014   Procedure: POLYPECTOMY;  Surgeon: West Bali, MD;  Location: [facility];  Service: Endoscopy;;   POLYPECTOMY N/A 11/06/2014   Procedure: POLYPECTOMY;  Surgeon: West Bali, MD;  Location: [facility];  Service: Endoscopy;  Laterality: N/A;  Transverse Colon x3, Ascending Colon x2, Descending Colon x3   REPLACEMENT TOTAL KNEE BILATERAL     SPACE OAR INSTILLATION N/A 06/10/2023   Procedure: SPACE OAR INSTILLATION;  Surgeon: Malen Gauze, MD;  Location: [facility];  Service: Urology;  Laterality: N/A;   UPPER GASTROINTESTINAL ENDOSCOPY  AP[facility] 2009   INFLAMED HYPERPLASTIC POLYPS, CHRONIC GASTRITIS    SOCIAL HISTORY:  Social History   Socioeconomic History   Marital status: Single    Spouse name: Not on file   Number of children: 0   Years of education: 3   Highest education level: Not on file  Occupational History   Occupation: retired    Comment: farming  Tobacco Use   Smoking status: Never    Passive exposure: Never   Smokeless tobacco: Never   Tobacco comments:    Quit x 10 years/ never smoked on regular basis  Vaping Use   Vaping status: Never Used  Substance and Sexual Activity   Alcohol use: Yes    Alcohol/week: 2.0  standard drinks of alcohol    Types: 2 Shots of liquor per week    Comment: drinks on weekends, gin/vodka 1/5th.   Drug use: No   Sexual activity: Never  Other Topics Concern   Not on file  Social History Narrative   HE DOES NOT HAVE ANY CHILDREN.   Lives alone   Does not drive   CAN NOT READ   Social Drivers of Health   Financial Resource Strain: Low Risk  (07/30/2020)   Overall Financial Resource Strain (CARDIA)    Difficulty of Paying Living Expenses: Not hard at all  Food Insecurity: No Food Insecurity (04/13/2023)   Hunger Vital Sign    Worried About Running Out of Food in the Last Year: Never true    Ran Out of Food in the Last Year: Never true  Transportation Needs: No Transportation Needs (04/13/2023)   PRAPARE - Administrator, Civil Service (Medical): No    Lack of Transportation (Non-Medical): No  Physical Activity: Insufficiently Active (07/30/2020)   Exercise Vital Sign    Days of Exercise per Week: 7 days    Minutes of Exercise per Session: 20 min  Stress: No Stress Concern Present (07/30/2020)   [employer] of Occupational Health - Occupational Stress Questionnaire    Feeling of Stress : Not at all  Social Connections: Socially Isolated (07/30/2020)   Social Connection and Isolation Panel [NHANES]    Frequency of Communication with Friends and Family: Never    Frequency of Social Gatherings with Friends and Family: Once a week    Attends Religious Services: Never    Database administrator or Organizations: No    Attends Banker Meetings: Never    Marital Status: Never married  Intimate Partner Violence: Not At Risk (04/13/2023)   Humiliation, Afraid, Rape, and Kick questionnaire    Fear of Current or Ex-Partner: No    Emotionally Abused: No    Physically Abused: No    Sexually Abused: No    FAMILY HISTORY:  Family History  Problem Relation Age of Onset   Diabetes Mother    Renal Disease Mother        failure   Diabetes  Father        'old age'   Hypertension Sister    Hypertension Brother    Diabetes Brother    Diabetes Sister    Diabetes Sister    Diabetes Brother  Hypertension Brother    Diabetes Brother        right BKA   Kidney failure Brother        on dialysis   Kidney failure Brother        on dialysis   Diabetes Brother        bilateral BKA   Colon polyps Neg Hx    Colon cancer Neg Hx    Pancreatic disease Neg Hx     CURRENT MEDICATIONS:  Outpatient Encounter Medications as of 11/01/2023  Medication Sig Note   amLODipine (NORVASC) 10 MG tablet Take 10 mg by mouth daily.    atorvastatin (LIPITOR) 20 MG tablet Take 1 tablet (20 mg total) by mouth daily.    clindamycin (CLEOCIN T) 1 % external solution Apply 1 Application topically daily. Apply to back of neck    colchicine 0.6 MG tablet Take one tablet three times a day for five days.  Stop if get diarrhea.    cyanocobalamin (VITAMIN B12) 1000 MCG tablet Take 1,000 mcg by mouth daily.    ferrous sulfate 325 (65 FE) MG tablet Take 325 mg by mouth daily.    HYDROcodone-acetaminophen (NORCO) 7.5-325 MG tablet One tablet every six hours as needed for pain.    levothyroxine (SYNTHROID) 50 MCG tablet Take 50 mcg by mouth every morning. 10/06/2023: Take with 200 mcg   levothyroxine (SYNTHROID, LEVOTHROID) 200 MCG tablet Take 1 tablet (200 mcg total) by mouth daily before breakfast.    metoprolol succinate (TOPROL-XL) 25 MG 24 hr tablet Take 1.5 tablets (37.5 mg total) by mouth daily.    omeprazole (PRILOSEC) 20 MG capsule TAKE 1 CAPSULE BY MOUTH ONCE DAILY.    potassium chloride (MICRO-K) 10 MEQ CR capsule Take 10 mEq by mouth daily.    sildenafil (VIAGRA) 100 MG tablet Take 0.5 tablets (50 mg total) by mouth daily as needed.    tamsulosin (FLOMAX) 0.4 MG CAPS capsule Take 1 capsule (0.4 mg total) by mouth daily.    Vitamin D, Cholecalciferol, 25 MCG (1000 UT) TABS Take 1 tablet by mouth daily.    No facility-administered encounter medications  on file as of 11/01/2023.    ALLERGIES:  Allergies  Allergen Reactions   Aspirin Other (See Comments)    Diverticular Bleed    LABORATORY DATA:  I have reviewed the labs as listed.  CBC    Component Value Date/Time   WBC 4.0 10/26/2023 1102   RBC 3.21 (L) 10/26/2023 1102   HGB 10.2 (L) 10/26/2023 1102   HCT 31.9 (L) 10/26/2023 1102   HCT 44 01/05/2013 0000   PLT 95 (L) 10/26/2023 1102   PLT 148 01/05/2013 0000   MCV 99.4 10/26/2023 1102   MCV 90.4 01/05/2013 0000   MCH 31.8 10/26/2023 1102   MCHC 32.0 10/26/2023 1102   RDW 15.1 10/26/2023 1102   LYMPHSABS 1.4 10/26/2023 1102   MONOABS 0.5 10/26/2023 1102   EOSABS 0.1 10/26/2023 1102   BASOSABS 0.0 10/26/2023 1102      Latest Ref Rng & Units 10/26/2023   11:02 AM 10/06/2023   11:21 AM 03/22/2023   10:26 AM  CMP  Glucose 70 - 99 mg/dL 409  811  914   BUN 8 - 23 mg/dL 16  12  16    Creatinine 0.61 - 1.24 mg/dL 7.82  9.56  2.13   Sodium 135 - 145 mmol/L 136  133  135   Potassium 3.5 - 5.1 mmol/L 4.7  3.8  4.2  Chloride 98 - 111 mmol/L 104  95  104   CO2 22 - 32 mmol/L 25  26  26    Calcium 8.9 - 10.3 mg/dL 9.1  9.1  8.9   Total Protein 6.5 - 8.1 g/dL 7.5   7.5   Total Bilirubin 0.0 - 1.2 mg/dL 1.0   1.4   Alkaline Phos 38 - 126 U/L 83   48   AST 15 - 41 U/L 62   45   ALT 0 - 44 U/L 41   29     DIAGNOSTIC IMAGING:  I have independently reviewed the relevant imaging and discussed with the patient.   WRAP UP:  All questions were answered. The patient knows to call the clinic with any problems, questions or concerns.  Medical decision making: Moderate  Time spent on visit: I spent 20 minutes counseling the patient face to face. The total time spent in the appointment was 30 minutes and more than 50% was on counseling.  Carnella Guadalajara, PA-C  11/01/23 10:36 AM

## 2023-11-01 ENCOUNTER — Inpatient Hospital Stay: Payer: 59 | Attending: Physician Assistant | Admitting: Physician Assistant

## 2023-11-01 ENCOUNTER — Inpatient Hospital Stay: Payer: 59 | Admitting: Physician Assistant

## 2023-11-01 ENCOUNTER — Inpatient Hospital Stay

## 2023-11-01 ENCOUNTER — Telehealth: Payer: Self-pay | Admitting: Physician Assistant

## 2023-11-01 VITALS — BP 108/72 | HR 77 | Temp 97.0°F | Resp 18 | Ht 75.0 in | Wt 199.0 lb

## 2023-11-01 DIAGNOSIS — D649 Anemia, unspecified: Secondary | ICD-10-CM | POA: Diagnosis not present

## 2023-11-01 DIAGNOSIS — C61 Malignant neoplasm of prostate: Secondary | ICD-10-CM | POA: Insufficient documentation

## 2023-11-01 DIAGNOSIS — D696 Thrombocytopenia, unspecified: Secondary | ICD-10-CM | POA: Diagnosis not present

## 2023-11-01 DIAGNOSIS — D3A8 Other benign neuroendocrine tumors: Secondary | ICD-10-CM | POA: Diagnosis not present

## 2023-11-01 DIAGNOSIS — C7A8 Other malignant neuroendocrine tumors: Secondary | ICD-10-CM

## 2023-11-01 DIAGNOSIS — Z79899 Other long term (current) drug therapy: Secondary | ICD-10-CM | POA: Diagnosis not present

## 2023-11-01 DIAGNOSIS — C7A092 Malignant carcinoid tumor of the stomach: Secondary | ICD-10-CM | POA: Diagnosis present

## 2023-11-01 LAB — RETICULOCYTES
Immature Retic Fract: 11 % (ref 2.3–15.9)
RBC.: 3.1 MIL/uL — ABNORMAL LOW (ref 4.22–5.81)
Retic Count, Absolute: 48.4 10*3/uL (ref 19.0–186.0)
Retic Ct Pct: 1.6 % (ref 0.4–3.1)

## 2023-11-01 LAB — IMMATURE PLATELET FRACTION: Immature Platelet Fraction: 6.1 % (ref 1.2–8.6)

## 2023-11-01 LAB — FOLATE: Folate: 4.2 ng/mL — ABNORMAL LOW (ref >5.9)

## 2023-11-01 LAB — LACTATE DEHYDROGENASE: LDH: 105 U/L (ref 98–192)

## 2023-11-01 LAB — VITAMIN B12: Vitamin B-12: 1261 pg/mL — ABNORMAL HIGH (ref 180–914)

## 2023-11-01 NOTE — Patient Instructions (Signed)
 [facility] at [facility] **VISIT SUMMARY & IMPORTANT INSTRUCTIONS **   You were seen today by Rojelio Brenner PA-C for your follow-up visit.    NEUROENDOCRINE CARCINOID TUMOR OF THE STOMACH: You had low-grade cancerous tumor in your stomach in 2015 and 2017. You are at risk for this cancer coming back, so we recommend seeing gastroenterology (stomach doctors) for another EGD ("running a camera and light down your throat to look for cancer"). Referral has been entered for you to see the doctors at [facility].  Please call them at [phone] to schedule your first appointment.  ANEMIA & THROMBOCYTOPENIA Your red blood cells and platelets are lower than normal. We will check labs today to see why your blood cells are low. I will see you for follow-up office visit in 1 month to discuss these results.  FOLLOW-UP APPOINTMENT: 4 to 6 weeks  ** Thank you for trusting me with your healthcare!  I strive to provide all of my patients with quality care at each visit.  If you receive a survey for this visit, I would be so grateful to you for taking the time to provide feedback.  Thank you in advance!  ~ Kiwanna Spraker                   Dr. Doreatha Massed   &   Rojelio Brenner, PA-C   - - - - - - - - - - - - - - - - - -    Thank you for choosing [facility] at [facility] to provide your oncology and hematology care.  To afford each patient quality time with our provider, please arrive at least 15 minutes before your scheduled appointment time.   If you have a lab appointment with the Cancer Center please come in thru the Main Entrance and check in at the main information desk.  You need to re-schedule your appointment should you arrive 10 or more minutes late.  We strive to give you quality time with our providers, and arriving late affects you and other patients whose appointments are after yours.  Also, if you no show  three or more times for appointments you may be dismissed from the clinic at the providers discretion.     Again, thank you for choosing [facility].  Our hope is that these requests will decrease the amount of time that you wait before being seen by our physicians.       _____________________________________________________________  Should you have questions after your visit to [facility], please contact our office at [phone] and follow the prompts.  Our office hours are 8:00 a.m. and 4:30 p.m. Monday - Friday.  Please note that voicemails left after 4:00 p.m. may not be returned until the following business day.  We are closed weekends and major holidays.  You do have access to a nurse 24-7, just call the main number to the clinic [phone] and do not press any options, hold on the line and a nurse will answer the phone.    For prescription refill requests, have your pharmacy contact our office and allow 72 hours.

## 2023-11-01 NOTE — Telephone Encounter (Signed)
 During office visit on 11/01/2023, discussed with patient the importance of obtaining updated the EGD for ongoing surveillance of his gastric neuroendocrine tumor.  Patient was agreeable, but somewhat hesitant.  He requested that I call and discussed with his brother ([name] at [phone]).  I called the provided number, but there was no answer and voicemail box was full, unable to accept new message.  Will attempt again later today.  Carnella Guadalajara, PA-C 11/01/23 11:40 AM

## 2023-11-01 NOTE — Addendum Note (Signed)
 Addended by: Rojelio Brenner on: 11/01/2023 11:49 AM   Modules accepted: Orders

## 2023-11-02 LAB — KAPPA/LAMBDA LIGHT CHAINS
Kappa free light chain: 108.8 mg/L — ABNORMAL HIGH (ref 3.3–19.4)
Kappa, lambda light chain ratio: 1.22 (ref 0.26–1.65)
Lambda free light chains: 88.9 mg/L — ABNORMAL HIGH (ref 5.7–26.3)

## 2023-11-03 LAB — PROTEIN ELECTROPHORESIS, SERUM
A/G Ratio: 0.9 (ref 0.7–1.7)
Albumin ELP: 3.4 g/dL (ref 2.9–4.4)
Alpha-1-Globulin: 0.3 g/dL (ref 0.0–0.4)
Alpha-2-Globulin: 0.5 g/dL (ref 0.4–1.0)
Beta Globulin: 1.7 g/dL — ABNORMAL HIGH (ref 0.7–1.3)
Gamma Globulin: 1.3 g/dL (ref 0.4–1.8)
Globulin, Total: 3.8 g/dL (ref 2.2–3.9)
M-Spike, %: 0.6 g/dL — ABNORMAL HIGH
Total Protein ELP: 7.2 g/dL (ref 6.0–8.5)

## 2023-11-03 LAB — IMMUNOFIXATION ELECTROPHORESIS
IgA: 1894 mg/dL — ABNORMAL HIGH (ref 61–437)
IgG (Immunoglobin G), Serum: 898 mg/dL (ref 603–1613)
IgM (Immunoglobulin M), Srm: 29 mg/dL (ref 15–143)
Total Protein ELP: 7 g/dL (ref 6.0–8.5)

## 2023-11-04 ENCOUNTER — Encounter: Payer: Self-pay | Admitting: *Deleted

## 2023-11-04 LAB — COPPER, SERUM: Copper: 58 ug/dL — ABNORMAL LOW (ref 69–132)

## 2023-11-04 LAB — METHYLMALONIC ACID, SERUM: Methylmalonic Acid, Quantitative: 214 nmol/L (ref 0–378)

## 2023-11-09 NOTE — H&P (View-Only) (Signed)
 GI Office Note    Referring Provider: [name], FNP Primary Care Physician:  [name], FNP  Primary Gastroenterologist: formerly Dr. Darrick Penna  Chief Complaint   No chief complaint on file.    History of Present Illness   [name] is a 73 y.o. male presenting today for follow up of neuroendocrine carcinoid tumor of the stomach at the request of Rojelio Brenner, PA-C.         Medications   Current Outpatient Medications  Medication Sig Dispense Refill   amLODipine (NORVASC) 10 MG tablet Take 10 mg by mouth daily.     atorvastatin (LIPITOR) 20 MG tablet Take 1 tablet (20 mg total) by mouth daily. 90 tablet 3   clindamycin (CLEOCIN T) 1 % external solution Apply 1 Application topically daily. Apply to back of neck     colchicine 0.6 MG tablet Take one tablet three times a day for five days.  Stop if get diarrhea. 15 tablet 3   cyanocobalamin (VITAMIN B12) 1000 MCG tablet Take 1,000 mcg by mouth daily.     ferrous sulfate 325 (65 FE) MG tablet Take 325 mg by mouth daily.     HYDROcodone-acetaminophen (NORCO) 7.5-325 MG tablet One tablet every six hours as needed for pain. 50 tablet 0   levothyroxine (SYNTHROID) 50 MCG tablet Take 50 mcg by mouth every morning.     levothyroxine (SYNTHROID, LEVOTHROID) 200 MCG tablet Take 1 tablet (200 mcg total) by mouth daily before breakfast. 90 tablet 3   metoprolol succinate (TOPROL-XL) 25 MG 24 hr tablet Take 1.5 tablets (37.5 mg total) by mouth daily. 45 tablet 6   omeprazole (PRILOSEC) 20 MG capsule TAKE 1 CAPSULE BY MOUTH ONCE DAILY. 30 capsule 3   potassium chloride (MICRO-K) 10 MEQ CR capsule Take 10 mEq by mouth daily.     sildenafil (VIAGRA) 100 MG tablet Take 0.5 tablets (50 mg total) by mouth daily as needed. 10 tablet 5   tamsulosin (FLOMAX) 0.4 MG CAPS capsule Take 1 capsule (0.4 mg total) by mouth daily. 90 capsule 3   Vitamin D, Cholecalciferol, 25 MCG (1000 UT) TABS Take 1 tablet by mouth daily.      No current facility-administered medications for this visit.    Allergies   Allergies as of 11/10/2023 - Review Complete 11/01/2023  Allergen Reaction Noted   Aspirin Other (See Comments) 06/01/2007    Past Medical History   Past Medical History:  Diagnosis Date   Anemia    FeDA: GASTRIC POLYPS, B12; TCS 2008 EGD 2009, 2008-HB 11.1 MCV 83.6 CR 1.22, 2009 FERRITIN 102-22   B12 deficiency    Carcinoid tumor determined by biopsy of stomach 09/25/2014   Chronic knee pain    Diverticulosis of colon    Lower GI bleed 2008   Essential hypertension    GERD (gastroesophageal reflux disease)    Gout    Hepatomegaly    Hepatic steatosis   History of alcohol abuse    History of septic arthritis    Hypothyroidism    PUD    Substance abuse [facility])     Past Surgical History   Past Surgical History:  Procedure Laterality Date   BIOPSY  08/14/2014   Procedure: BIOPSY;  Surgeon: West Bali, MD;  Location: [facility];  Service: Endoscopy;;   BIOPSY  03/12/2015   Procedure: BIOPSY;  Surgeon: West Bali, MD;  [facility]: [facility];  Service: Endoscopy;;   BIOPSY  04/21/2016   Procedure: BIOPSY;  Surgeon: West Bali, MD;  [facility];  Service: Endoscopy;;  bx's of antrum, body of stomach, fundus, and cardia   CHOLECYSTECTOMY     COLONOSCOPY  2008 [facility] DJ   LGIB 2o to TICS, prep good   COLONOSCOPY WITH PROPOFOL N/A 08/14/2014   SLF: 1. Four large colon polyps removed. 2. The left colon is redundant 3. Moderate diverticulosis throughout teh entire examined colon   COLONOSCOPY WITH PROPOFOL N/A 11/06/2014   SLF: 8 small polyps removed. One large pedunculated polyp removed from the ascending colon, tubulovillous and tubular adenomas. Next colonoscopy March 2019   COLONOSCOPY WITH PROPOFOL N/A 01/04/2018   Procedure: COLONOSCOPY WITH PROPOFOL;  Surgeon: West Bali, MD;  [facility];  Service: Endoscopy;  Laterality: N/A;  10:00am   ESOPHAGOGASTRODUODENOSCOPY   12/08/2007   WUJ:WJXB gastric polyps seen in the cardia and body of the stomach/Normal esophagus without evidence of Barrett's, mass, erosion/Normal duodenal bulb and second portion of the duodenum. Benign bx.   ESOPHAGOGASTRODUODENOSCOPY (EGD) WITH PROPOFOL N/A 08/14/2014   SLF: 1. Heme postive stools due to gastric and colon polyps 2. Multiple gastric    ESOPHAGOGASTRODUODENOSCOPY (EGD) WITH PROPOFOL N/A 03/12/2015   SLF: Multiple gastric nodules seen in gasric body/antrum. 2. Non-erosive gastritis ( inflammation) was found in the gastric antrum.    ESOPHAGOGASTRODUODENOSCOPY (EGD) WITH PROPOFOL N/A 10/08/2015   Procedure: ESOPHAGOGASTRODUODENOSCOPY (EGD) WITH PROPOFOL;  Surgeon: West Bali, MD;  [facility];  Service: Endoscopy;  Laterality: N/A;  0945   ESOPHAGOGASTRODUODENOSCOPY (EGD) WITH PROPOFOL N/A 04/21/2016   Procedure: ESOPHAGOGASTRODUODENOSCOPY (EGD) WITH PROPOFOL;  Surgeon: West Bali, MD;  [facility];  Service: Endoscopy;  Laterality: N/A;  730    GOLD SEED IMPLANT N/A 06/10/2023   Procedure: GOLD SEED IMPLANT;  Surgeon: Malen Gauze, MD;  Location: [facility];  Service: Urology;  Laterality: N/A;   HAND SURGERY Right    fracture repair with plates   INGUINAL HERNIA REPAIR Left 11/09/2018   Procedure: HERNIA REPAIR INGUINAL ADULT WITH MESH;  Surgeon: Lucretia Roers, MD;  Location: [facility];  Service: General;  Laterality: Left;   JOINT REPLACEMENT Bilateral    knees   POLYPECTOMY  08/14/2014   Procedure: POLYPECTOMY;  Surgeon: West Bali, MD;  [facility]: [facility];  Service: Endoscopy;;   POLYPECTOMY N/A 11/06/2014   Procedure: POLYPECTOMY;  Surgeon: West Bali, MD;  Location: [facility];  Service: Endoscopy;  Laterality: N/A;  Transverse Colon x3, Ascending Colon x2, Descending Colon x3   REPLACEMENT TOTAL KNEE BILATERAL     SPACE OAR INSTILLATION N/A 06/10/2023   Procedure: SPACE OAR INSTILLATION;  Surgeon: Malen Gauze, MD;  [facility];  Service: Urology;  Laterality: N/A;   UPPER GASTROINTESTINAL ENDOSCOPY  APR 2009   INFLAMED HYPERPLASTIC POLYPS, CHRONIC GASTRITIS    Past Family History   Family History  Problem Relation Age of Onset   Diabetes Mother    Renal Disease Mother        failure   Diabetes Father        'old age'   Hypertension Sister    Hypertension Brother    Diabetes Brother    Diabetes Sister    Diabetes Sister    Diabetes Brother    Hypertension Brother    Diabetes Brother        right BKA   Kidney failure Brother        on dialysis  Kidney failure Brother        on dialysis   Diabetes Brother        bilateral BKA   Colon polyps Neg Hx    Colon cancer Neg Hx    Pancreatic disease Neg Hx     Past Social History   Social History   Socioeconomic History   Marital status: Single    Spouse name: Not on file   Number of children: 0   Years of education: 3   Highest education level: Not on file  Occupational History   Occupation: retired    Comment: farming  Tobacco Use   Smoking status: Never    Passive exposure: Never   Smokeless tobacco: Never   Tobacco comments:    Quit x 10 years/ never smoked on regular basis  Vaping Use   Vaping status: Never Used  Substance and Sexual Activity   Alcohol use: Yes    Alcohol/week: 2.0 standard drinks of alcohol    Types: 2 Shots of liquor per week    Comment: drinks on weekends, gin/vodka 1/5th.   Drug use: No   Sexual activity: Never  Other Topics Concern   Not on file  Social History Narrative   HE DOES NOT HAVE ANY CHILDREN.   Lives alone   Does not drive   CAN NOT READ   Social Drivers of Health   Financial Resource Strain: Low Risk  (07/30/2020)   Overall Financial Resource Strain (CARDIA)    Difficulty of Paying Living Expenses: Not hard at all  Food Insecurity: No Food Insecurity (04/13/2023)   Hunger Vital Sign    Worried About Running Out of Food in the Last Year: Never true    Ran Out of Food in the Last  Year: Never true  Transportation Needs: No Transportation Needs (04/13/2023)   PRAPARE - Administrator, Civil Service (Medical): No    Lack of Transportation (Non-Medical): No  Physical Activity: Insufficiently Active (07/30/2020)   Exercise Vital Sign    Days of Exercise per Week: 7 days    Minutes of Exercise per Session: 20 min  Stress: No Stress Concern Present (07/30/2020)   [employer] of Occupational Health - Occupational Stress Questionnaire    Feeling of Stress : Not at all  Social Connections: Socially Isolated (07/30/2020)   Social Connection and Isolation Panel [NHANES]    Frequency of Communication with Friends and Family: Never    Frequency of Social Gatherings with Friends and Family: Once a week    Attends Religious Services: Never    Database administrator or Organizations: No    Attends Banker Meetings: Never    Marital Status: Never married  Intimate Partner Violence: Not At Risk (04/13/2023)   Humiliation, Afraid, Rape, and Kick questionnaire    Fear of Current or Ex-Partner: No    Emotionally Abused: No    Physically Abused: No    Sexually Abused: No    Review of Systems   General: Negative for anorexia, weight loss, fever, chills, fatigue, weakness. Eyes: Negative for vision changes.  ENT: Negative for hoarseness, difficulty swallowing , nasal congestion. CV: Negative for chest pain, angina, palpitations, dyspnea on exertion, peripheral edema.  Respiratory: Negative for dyspnea at rest, dyspnea on exertion, cough, sputum, wheezing.  GI: See history of present illness. GU:  Negative for dysuria, hematuria, urinary incontinence, urinary frequency, nocturnal urination.  MS: Negative for joint pain, low back pain.  Derm: Negative for rash  or itching.  Neuro: Negative for weakness, abnormal sensation, seizure, frequent headaches, memory loss,  confusion.  Psych: Negative for anxiety, depression, suicidal ideation, hallucinations.   Endo: Negative for unusual weight change.  Heme: Negative for bruising or bleeding. Allergy: Negative for rash or hives.  Physical Exam   There were no vitals taken for this visit.   General: Well-nourished, well-developed in no acute distress.  Head: Normocephalic, atraumatic.   Eyes: Conjunctiva pink, no icterus. Mouth: Oropharyngeal mucosa moist and pink , no lesions erythema or exudate. Neck: Supple without thyromegaly, masses, or lymphadenopathy.  Lungs: Clear to auscultation bilaterally.  Heart: Regular rate and rhythm, no murmurs rubs or gallops.  Abdomen: Bowel sounds are normal, nontender, nondistended, no hepatosplenomegaly or masses,  no abdominal bruits or hernia, no rebound or guarding.   Rectal: *** Extremities: No lower extremity edema. No clubbing or deformities.  Neuro: Alert and oriented x 4 , grossly normal neurologically.  Skin: Warm and dry, no rash or jaundice.   Psych: Alert and cooperative, normal mood and affect.  Labs   *** Imaging Studies   No results found.  Assessment       PLAN   ***   Leanna Battles. Melvyn Neth, MHS, PA-C [facility]

## 2023-11-09 NOTE — Progress Notes (Unsigned)
 GI Office Note    Referring Provider: [name], FNP Primary Care Physician:  [name], FNP  Primary Gastroenterologist: formerly Dr. Darrick Penna  Chief Complaint   No chief complaint on file.    History of Present Illness   [name] is a 73 y.o. male presenting today for follow up of neuroendocrine carcinoid tumor of the stomach at the request of Rojelio Brenner, PA-C.         Medications   Current Outpatient Medications  Medication Sig Dispense Refill   amLODipine (NORVASC) 10 MG tablet Take 10 mg by mouth daily.     atorvastatin (LIPITOR) 20 MG tablet Take 1 tablet (20 mg total) by mouth daily. 90 tablet 3   clindamycin (CLEOCIN T) 1 % external solution Apply 1 Application topically daily. Apply to back of neck     colchicine 0.6 MG tablet Take one tablet three times a day for five days.  Stop if get diarrhea. 15 tablet 3   cyanocobalamin (VITAMIN B12) 1000 MCG tablet Take 1,000 mcg by mouth daily.     ferrous sulfate 325 (65 FE) MG tablet Take 325 mg by mouth daily.     HYDROcodone-acetaminophen (NORCO) 7.5-325 MG tablet One tablet every six hours as needed for pain. 50 tablet 0   levothyroxine (SYNTHROID) 50 MCG tablet Take 50 mcg by mouth every morning.     levothyroxine (SYNTHROID, LEVOTHROID) 200 MCG tablet Take 1 tablet (200 mcg total) by mouth daily before breakfast. 90 tablet 3   metoprolol succinate (TOPROL-XL) 25 MG 24 hr tablet Take 1.5 tablets (37.5 mg total) by mouth daily. 45 tablet 6   omeprazole (PRILOSEC) 20 MG capsule TAKE 1 CAPSULE BY MOUTH ONCE DAILY. 30 capsule 3   potassium chloride (MICRO-K) 10 MEQ CR capsule Take 10 mEq by mouth daily.     sildenafil (VIAGRA) 100 MG tablet Take 0.5 tablets (50 mg total) by mouth daily as needed. 10 tablet 5   tamsulosin (FLOMAX) 0.4 MG CAPS capsule Take 1 capsule (0.4 mg total) by mouth daily. 90 capsule 3   Vitamin D, Cholecalciferol, 25 MCG (1000 UT) TABS Take 1 tablet by mouth daily.      No current facility-administered medications for this visit.    Allergies   Allergies as of 11/10/2023 - Review Complete 11/01/2023  Allergen Reaction Noted   Aspirin Other (See Comments) 06/01/2007    Past Medical History   Past Medical History:  Diagnosis Date   Anemia    FeDA: GASTRIC POLYPS, B12; TCS 2008 EGD 2009, 2008-HB 11.1 MCV 83.6 CR 1.22, 2009 FERRITIN 102-22   B12 deficiency    Carcinoid tumor determined by biopsy of stomach 09/25/2014   Chronic knee pain    Diverticulosis of colon    Lower GI bleed 2008   Essential hypertension    GERD (gastroesophageal reflux disease)    Gout    Hepatomegaly    Hepatic steatosis   History of alcohol abuse    History of septic arthritis    Hypothyroidism    PUD    Substance abuse [facility])     Past Surgical History   Past Surgical History:  Procedure Laterality Date   BIOPSY  08/14/2014   Procedure: BIOPSY;  Surgeon: West Bali, MD;  [facility]: [facility];  Service: Endoscopy;;   BIOPSY  03/12/2015   Procedure: BIOPSY;  Surgeon: West Bali, MD;  [facility]: [facility];  Service: Endoscopy;;   BIOPSY  04/21/2016   Procedure: BIOPSY;  Surgeon: West Bali, MD;  [facility];  Service: Endoscopy;;  bx's of antrum, body of stomach, fundus, and cardia   CHOLECYSTECTOMY     COLONOSCOPY  2008 [facility] DJ   LGIB 2o to TICS, prep good   COLONOSCOPY WITH PROPOFOL N/A 08/14/2014   SLF: 1. Four large colon polyps removed. 2. The left colon is redundant 3. Moderate diverticulosis throughout teh entire examined colon   COLONOSCOPY WITH PROPOFOL N/A 11/06/2014   SLF: 8 small polyps removed. One large pedunculated polyp removed from the ascending colon, tubulovillous and tubular adenomas. Next colonoscopy March 2019   COLONOSCOPY WITH PROPOFOL N/A 01/04/2018   Procedure: COLONOSCOPY WITH PROPOFOL;  Surgeon: West Bali, MD;  [facility];  Service: Endoscopy;  Laterality: N/A;  10:00am   ESOPHAGOGASTRODUODENOSCOPY   12/08/2007   WUJ:WJXB gastric polyps seen in the cardia and body of the stomach/Normal esophagus without evidence of Barrett's, mass, erosion/Normal duodenal bulb and second portion of the duodenum. Benign bx.   ESOPHAGOGASTRODUODENOSCOPY (EGD) WITH PROPOFOL N/A 08/14/2014   SLF: 1. Heme postive stools due to gastric and colon polyps 2. Multiple gastric    ESOPHAGOGASTRODUODENOSCOPY (EGD) WITH PROPOFOL N/A 03/12/2015   SLF: Multiple gastric nodules seen in gasric body/antrum. 2. Non-erosive gastritis ( inflammation) was found in the gastric antrum.    ESOPHAGOGASTRODUODENOSCOPY (EGD) WITH PROPOFOL N/A 10/08/2015   Procedure: ESOPHAGOGASTRODUODENOSCOPY (EGD) WITH PROPOFOL;  Surgeon: West Bali, MD;  [facility];  Service: Endoscopy;  Laterality: N/A;  0945   ESOPHAGOGASTRODUODENOSCOPY (EGD) WITH PROPOFOL N/A 04/21/2016   Procedure: ESOPHAGOGASTRODUODENOSCOPY (EGD) WITH PROPOFOL;  Surgeon: West Bali, MD;  [facility];  Service: Endoscopy;  Laterality: N/A;  730    GOLD SEED IMPLANT N/A 06/10/2023   Procedure: GOLD SEED IMPLANT;  Surgeon: Malen Gauze, MD;  Location: [facility];  Service: Urology;  Laterality: N/A;   HAND SURGERY Right    fracture repair with plates   INGUINAL HERNIA REPAIR Left 11/09/2018   Procedure: HERNIA REPAIR INGUINAL ADULT WITH MESH;  Surgeon: Lucretia Roers, MD;  Location: [facility];  Service: General;  Laterality: Left;   JOINT REPLACEMENT Bilateral    knees   POLYPECTOMY  08/14/2014   Procedure: POLYPECTOMY;  Surgeon: West Bali, MD;  [facility];  Service: Endoscopy;;   POLYPECTOMY N/A 11/06/2014   Procedure: POLYPECTOMY;  Surgeon: West Bali, MD;  Location: [facility];  Service: Endoscopy;  Laterality: N/A;  Transverse Colon x3, Ascending Colon x2, Descending Colon x3   REPLACEMENT TOTAL KNEE BILATERAL     SPACE OAR INSTILLATION N/A 06/10/2023   Procedure: SPACE OAR INSTILLATION;  Surgeon: Malen Gauze, MD;  [facility];  Service: Urology;  Laterality: N/A;   UPPER GASTROINTESTINAL ENDOSCOPY  APR 2009   INFLAMED HYPERPLASTIC POLYPS, CHRONIC GASTRITIS    Past Family History   Family History  Problem Relation Age of Onset   Diabetes Mother    Renal Disease Mother        failure   Diabetes Father        'old age'   Hypertension Sister    Hypertension Brother    Diabetes Brother    Diabetes Sister    Diabetes Sister    Diabetes Brother    Hypertension Brother    Diabetes Brother        right BKA   Kidney failure Brother        on dialysis  Kidney failure Brother        on dialysis   Diabetes Brother        bilateral BKA   Colon polyps Neg Hx    Colon cancer Neg Hx    Pancreatic disease Neg Hx     Past Social History   Social History   Socioeconomic History   Marital status: Single    Spouse name: Not on file   Number of children: 0   Years of education: 3   Highest education level: Not on file  Occupational History   Occupation: retired    Comment: farming  Tobacco Use   Smoking status: Never    Passive exposure: Never   Smokeless tobacco: Never   Tobacco comments:    Quit x 10 years/ never smoked on regular basis  Vaping Use   Vaping status: Never Used  Substance and Sexual Activity   Alcohol use: Yes    Alcohol/week: 2.0 standard drinks of alcohol    Types: 2 Shots of liquor per week    Comment: drinks on weekends, gin/vodka 1/5th.   Drug use: No   Sexual activity: Never  Other Topics Concern   Not on file  Social History Narrative   HE DOES NOT HAVE ANY CHILDREN.   Lives alone   Does not drive   CAN NOT READ   Social Drivers of Health   Financial Resource Strain: Low Risk  (07/30/2020)   Overall Financial Resource Strain (CARDIA)    Difficulty of Paying Living Expenses: Not hard at all  Food Insecurity: No Food Insecurity (04/13/2023)   Hunger Vital Sign    Worried About Running Out of Food in the Last Year: Never true    Ran Out of Food in the Last  Year: Never true  Transportation Needs: No Transportation Needs (04/13/2023)   PRAPARE - Administrator, Civil Service (Medical): No    Lack of Transportation (Non-Medical): No  Physical Activity: Insufficiently Active (07/30/2020)   Exercise Vital Sign    Days of Exercise per Week: 7 days    Minutes of Exercise per Session: 20 min  Stress: No Stress Concern Present (07/30/2020)   [employer] of Occupational Health - Occupational Stress Questionnaire    Feeling of Stress : Not at all  Social Connections: Socially Isolated (07/30/2020)   Social Connection and Isolation Panel [NHANES]    Frequency of Communication with Friends and Family: Never    Frequency of Social Gatherings with Friends and Family: Once a week    Attends Religious Services: Never    Database administrator or Organizations: No    Attends Banker Meetings: Never    Marital Status: Never married  Intimate Partner Violence: Not At Risk (04/13/2023)   Humiliation, Afraid, Rape, and Kick questionnaire    Fear of Current or Ex-Partner: No    Emotionally Abused: No    Physically Abused: No    Sexually Abused: No    Review of Systems   General: Negative for anorexia, weight loss, fever, chills, fatigue, weakness. Eyes: Negative for vision changes.  ENT: Negative for hoarseness, difficulty swallowing , nasal congestion. CV: Negative for chest pain, angina, palpitations, dyspnea on exertion, peripheral edema.  Respiratory: Negative for dyspnea at rest, dyspnea on exertion, cough, sputum, wheezing.  GI: See history of present illness. GU:  Negative for dysuria, hematuria, urinary incontinence, urinary frequency, nocturnal urination.  MS: Negative for joint pain, low back pain.  Derm: Negative for rash  or itching.  Neuro: Negative for weakness, abnormal sensation, seizure, frequent headaches, memory loss,  confusion.  Psych: Negative for anxiety, depression, suicidal ideation, hallucinations.   Endo: Negative for unusual weight change.  Heme: Negative for bruising or bleeding. Allergy: Negative for rash or hives.  Physical Exam   There were no vitals taken for this visit.   General: Well-nourished, well-developed in no acute distress.  Head: Normocephalic, atraumatic.   Eyes: Conjunctiva pink, no icterus. Mouth: Oropharyngeal mucosa moist and pink , no lesions erythema or exudate. Neck: Supple without thyromegaly, masses, or lymphadenopathy.  Lungs: Clear to auscultation bilaterally.  Heart: Regular rate and rhythm, no murmurs rubs or gallops.  Abdomen: Bowel sounds are normal, nontender, nondistended, no hepatosplenomegaly or masses,  no abdominal bruits or hernia, no rebound or guarding.   Rectal: *** Extremities: No lower extremity edema. No clubbing or deformities.  Neuro: Alert and oriented x 4 , grossly normal neurologically.  Skin: Warm and dry, no rash or jaundice.   Psych: Alert and cooperative, normal mood and affect.  Labs   *** Imaging Studies   No results found.  Assessment       PLAN   ***   Leanna Battles. Melvyn Neth, MHS, PA-C [facility]

## 2023-11-10 ENCOUNTER — Encounter: Payer: Self-pay | Admitting: *Deleted

## 2023-11-10 ENCOUNTER — Encounter: Payer: Self-pay | Admitting: Gastroenterology

## 2023-11-10 ENCOUNTER — Telehealth: Payer: Self-pay

## 2023-11-10 ENCOUNTER — Ambulatory Visit (INDEPENDENT_AMBULATORY_CARE_PROVIDER_SITE_OTHER): Admitting: Gastroenterology

## 2023-11-10 VITALS — BP 115/70 | HR 95 | Temp 96.7°F | Ht 75.0 in | Wt 199.0 lb

## 2023-11-10 DIAGNOSIS — D3A092 Benign carcinoid tumor of the stomach: Secondary | ICD-10-CM

## 2023-11-10 DIAGNOSIS — D649 Anemia, unspecified: Secondary | ICD-10-CM | POA: Diagnosis not present

## 2023-11-10 DIAGNOSIS — D696 Thrombocytopenia, unspecified: Secondary | ICD-10-CM | POA: Diagnosis not present

## 2023-11-10 DIAGNOSIS — F1010 Alcohol abuse, uncomplicated: Secondary | ICD-10-CM

## 2023-11-10 MED ORDER — HYDROCODONE-ACETAMINOPHEN 7.5-325 MG PO TABS: ORAL_TABLET | ORAL | 0 refills | Status: DC

## 2023-11-10 NOTE — Patient Instructions (Signed)
 Upper endoscopy to be schedule.

## 2023-11-10 NOTE — Telephone Encounter (Signed)
 Hydrocodone-Acetaminophen 7.5/325 MG Qty 50 Tablets  PATIENT USES [facility]

## 2023-11-11 ENCOUNTER — Telehealth: Payer: Self-pay | Admitting: *Deleted

## 2023-11-11 ENCOUNTER — Ambulatory Visit: Admitting: Internal Medicine

## 2023-11-11 NOTE — Telephone Encounter (Signed)
 Called [place] (pt's aid, on dpr) with pre-op appointment on 11/25/23, Thursday, arrive at 11:00 am. If pt doesn't get instructions by pre-op date, he can ask at that appointment to have them printed out. Verbalized understanding

## 2023-11-12 ENCOUNTER — Telehealth: Payer: Self-pay | Admitting: Gastroenterology

## 2023-11-12 NOTE — Telephone Encounter (Signed)
 Tammy R: did I put on encounter form to do abd u/s? I ordered it but wasn't sure if told you.  Tammy C: can you ask patient to start folic acid, he can start OTC folic acid 400 international units, take TWO daily. When he sees hematology they may decide to start RX strength but I will let them decide.

## 2023-11-15 NOTE — Telephone Encounter (Signed)
 Pt has been scheduled for 11/18/23, arrive at 9:15 am, NPO after midnight. [place] (guardian on dpr) notified of appt.

## 2023-11-15 NOTE — Telephone Encounter (Signed)
 Spoke with [place] and advised him to get OTC folic acid as recommended. Rico verbalized understanding.

## 2023-11-18 ENCOUNTER — Ambulatory Visit (HOSPITAL_COMMUNITY)
Admission: RE | Admit: 2023-11-18 | Discharge: 2023-11-18 | Disposition: A | Discharge status: Home / Self Care | Source: Ambulatory Visit | Attending: Gastroenterology | Admitting: Gastroenterology

## 2023-11-18 DIAGNOSIS — D696 Thrombocytopenia, unspecified: Secondary | ICD-10-CM | POA: Insufficient documentation

## 2023-11-18 DIAGNOSIS — F1010 Alcohol abuse, uncomplicated: Secondary | ICD-10-CM | POA: Diagnosis present

## 2023-11-18 DIAGNOSIS — D3A092 Benign carcinoid tumor of the stomach: Secondary | ICD-10-CM | POA: Insufficient documentation

## 2023-11-19 ENCOUNTER — Ambulatory Visit: Payer: 59 | Admitting: Urology

## 2023-11-19 VITALS — BP 108/68 | HR 91

## 2023-11-19 DIAGNOSIS — N401 Enlarged prostate with lower urinary tract symptoms: Secondary | ICD-10-CM

## 2023-11-19 DIAGNOSIS — C61 Malignant neoplasm of prostate: Secondary | ICD-10-CM

## 2023-11-19 DIAGNOSIS — R351 Nocturia: Secondary | ICD-10-CM | POA: Diagnosis not present

## 2023-11-19 DIAGNOSIS — N138 Other obstructive and reflux uropathy: Secondary | ICD-10-CM | POA: Diagnosis not present

## 2023-11-19 MED ORDER — TAMSULOSIN HCL 0.4 MG PO CAPS: 0.4000 mg | ORAL_CAPSULE | Freq: Every day | ORAL | 3 refills | Status: DC

## 2023-11-19 NOTE — Progress Notes (Signed)
 11/19/2023 12:45 PM   [name] September 12, 1950 [id number]  Referring provider: [name], FNP [address] [place],  [place] [address]  Followup prostate cancer   HPI: [name] is a 72yo here for followup for prostate cancer and BPH with nocturia. PSA 0.78. IPSS 22 QOL 5 on flomax 0.4mg  daily. Nocturia 2x. Urine stream fair. He has intermittent straining to urinate. No other complaints today PMH: Past Medical History:  Diagnosis Date   Anemia    FeDA: GASTRIC POLYPS, B12; TCS 2008 EGD 2009, 2008-HB 11.1 MCV 83.6 CR 1.22, 2009 FERRITIN 102-22   B12 deficiency    Carcinoid tumor determined by biopsy of stomach 09/25/2014   Chronic knee pain    Diverticulosis of colon    Lower GI bleed 2008   Essential hypertension    GERD (gastroesophageal reflux disease)    Gout    Hepatomegaly    Hepatic steatosis   History of alcohol abuse    History of septic arthritis    Hypothyroidism    PUD    Substance abuse (HCC)     Surgical History: Past Surgical History:  Procedure Laterality Date   BIOPSY  08/14/2014   Procedure: BIOPSY;  Surgeon: West Bali, MD;  Location: [facility];  Service: Endoscopy;;   BIOPSY  03/12/2015   Procedure: BIOPSY;  Surgeon: West Bali, MD;  Location: [facility];  Service: Endoscopy;;   BIOPSY  04/21/2016   Procedure: BIOPSY;  Surgeon: West Bali, MD;  [facility];  Service: Endoscopy;;  bx's of antrum, body of stomach, fundus, and cardia   CHOLECYSTECTOMY     COLONOSCOPY  2008 [facility] DJ   LGIB 2o to TICS, prep good   COLONOSCOPY WITH PROPOFOL N/A 08/14/2014   SLF: 1. Four large colon polyps removed. 2. The left colon is redundant 3. Moderate diverticulosis throughout teh entire examined colon   COLONOSCOPY WITH PROPOFOL N/A 11/06/2014   SLF: 8 small polyps removed. One large pedunculated polyp removed from the ascending colon, tubulovillous and tubular adenomas. Next colonoscopy March 2019   COLONOSCOPY WITH PROPOFOL N/A  01/04/2018   Procedure: COLONOSCOPY WITH PROPOFOL;  Surgeon: West Bali, MD;  [facility];  Service: Endoscopy;  Laterality: N/A;  10:00am   ESOPHAGOGASTRODUODENOSCOPY  12/08/2007   YTK:ZSWF gastric polyps seen in the cardia and body of the stomach/Normal esophagus without evidence of Barrett's, mass, erosion/Normal duodenal bulb and second portion of the duodenum. Benign bx.   ESOPHAGOGASTRODUODENOSCOPY (EGD) WITH PROPOFOL N/A 08/14/2014   SLF: 1. Heme postive stools due to gastric and colon polyps 2. Multiple gastric    ESOPHAGOGASTRODUODENOSCOPY (EGD) WITH PROPOFOL N/A 03/12/2015   SLF: Multiple gastric nodules seen in gasric body/antrum. 2. Non-erosive gastritis ( inflammation) was found in the gastric antrum.    ESOPHAGOGASTRODUODENOSCOPY (EGD) WITH PROPOFOL N/A 10/08/2015   Procedure: ESOPHAGOGASTRODUODENOSCOPY (EGD) WITH PROPOFOL;  Surgeon: West Bali, MD;  [facility];  Service: Endoscopy;  Laterality: N/A;  0945   ESOPHAGOGASTRODUODENOSCOPY (EGD) WITH PROPOFOL N/A 04/21/2016   Procedure: ESOPHAGOGASTRODUODENOSCOPY (EGD) WITH PROPOFOL;  Surgeon: West Bali, MD;  [facility];  Service: Endoscopy;  Laterality: N/A;  730    GOLD SEED IMPLANT N/A 06/10/2023   Procedure: GOLD SEED IMPLANT;  Surgeon: Malen Gauze, MD;  Location: [facility];  Service: Urology;  Laterality: N/A;   HAND SURGERY Right    fracture repair with plates   INGUINAL HERNIA REPAIR Left 11/09/2018  Procedure: HERNIA REPAIR INGUINAL ADULT WITH MESH;  Surgeon: Lucretia Roers, MD;  Location: [facility];  Service: General;  Laterality: Left;   JOINT REPLACEMENT Bilateral    knees   POLYPECTOMY  08/14/2014   Procedure: POLYPECTOMY;  Surgeon: West Bali, MD;  Location: [facility];  Service: Endoscopy;;   POLYPECTOMY N/A 11/06/2014   Procedure: POLYPECTOMY;  Surgeon: West Bali, MD;  Location: [facility];  Service: Endoscopy;  Laterality: N/A;  Transverse Colon x3, Ascending Colon  x2, Descending Colon x3   REPLACEMENT TOTAL KNEE BILATERAL     SPACE OAR INSTILLATION N/A 06/10/2023   Procedure: SPACE OAR INSTILLATION;  Surgeon: Malen Gauze, MD;  Location: [facility];  Service: Urology;  Laterality: N/A;   UPPER GASTROINTESTINAL ENDOSCOPY  AP[facility] 2009   INFLAMED HYPERPLASTIC POLYPS, CHRONIC GASTRITIS    Home Medications:  Allergies as of 11/19/2023       Reactions   Aspirin Other (See Comments)   Diverticular Bleed        Medication List        Accurate as of November 19, 2023 12:45 PM. If you have any questions, ask your nurse or doctor.          amLODipine 10 MG tablet Commonly known as: NORVASC Take 10 mg by mouth daily.   atorvastatin 20 MG tablet Commonly known as: LIPITOR Take 1 tablet (20 mg total) by mouth daily.   clindamycin 1 % external solution Commonly known as: CLEOCIN T Apply 1 Application topically daily. Apply to back of neck   colchicine 0.6 MG tablet Take one tablet three times a day for five days.  Stop if get diarrhea.   cyanocobalamin 1000 MCG tablet Commonly known as: VITAMIN B12 Take 1,000 mcg by mouth daily.   ferrous sulfate 325 (65 FE) MG tablet Take 325 mg by mouth daily.   HYDROcodone-acetaminophen 7.5-325 MG tablet Commonly known as: Norco One tablet every six hours as needed for pain.   levothyroxine 200 MCG tablet Commonly known as: SYNTHROID Take 1 tablet (200 mcg total) by mouth daily before breakfast.   levothyroxine 50 MCG tablet Commonly known as: SYNTHROID Take 50 mcg by mouth every morning.   metoprolol succinate 25 MG 24 hr tablet Commonly known as: TOPROL-XL Take 1.5 tablets (37.5 mg total) by mouth daily.   omeprazole 20 MG capsule Commonly known as: PRILOSEC TAKE 1 CAPSULE BY MOUTH ONCE DAILY.   potassium chloride 10 MEQ CR capsule Commonly known as: MICRO-K Take 10 mEq by mouth daily.   tamsulosin 0.4 MG Caps capsule Commonly known as: Flomax Take 1 capsule (0.4 mg total) by mouth  daily.        Allergies:  Allergies  Allergen Reactions   Aspirin Other (See Comments)    Diverticular Bleed    Family History: Family History  Problem Relation Age of Onset   Diabetes Mother    Renal Disease Mother        failure   Diabetes Father        'old age'   Hypertension Sister    Hypertension Brother    Diabetes Brother    Diabetes Sister    Diabetes Sister    Diabetes Brother    Hypertension Brother    Diabetes Brother        right BKA   Kidney failure Brother        on dialysis   Kidney failure Brother        on dialysis  Diabetes Brother        bilateral BKA   Colon polyps Neg Hx    Colon cancer Neg Hx    Pancreatic disease Neg Hx     Social History:  reports that he has never smoked. He has never been exposed to tobacco smoke. He has never used smokeless tobacco. He reports current alcohol use of about 2.0 standard drinks of alcohol per week. He reports that he does not use drugs.  ROS: All other review of systems were reviewed and are negative except what is noted above in HPI  Physical Exam: BP 108/68   Pulse 91   Constitutional:  Alert and oriented, No acute distress. HEENT: [place] AT, moist mucus membranes.  Trachea midline, no masses. Cardiovascular: No clubbing, cyanosis, or edema. Respiratory: Normal respiratory effort, no increased work of breathing. GI: Abdomen is soft, nontender, nondistended, no abdominal masses GU: No CVA tenderness.  Lymph: No cervical or inguinal lymphadenopathy. Skin: No rashes, bruises or suspicious lesions. Neurologic: Grossly intact, no focal deficits, moving all 4 extremities. Psychiatric: Normal mood and affect.  Laboratory Data: Lab Results  Component Value Date   WBC 4.0 10/26/2023   HGB 10.2 (L) 10/26/2023   HCT 31.9 (L) 10/26/2023   MCV 99.4 10/26/2023   PLT 95 (L) 10/26/2023    Lab Results  Component Value Date   CREATININE 1.17 10/26/2023    Lab Results  Component Value Date   PSA 2.23  11/30/2008   PSA 2.90 06/02/2007    No results found for: "TESTOSTERONE"  Lab Results  Component Value Date   HGBA1C 5.6 10/16/2016    Urinalysis    Component Value Date/Time   COLORURINE YELLOW 07/25/2020 0043   APPEARANCEUR Clear 10/23/2022 0959   LABSPEC 1.009 07/25/2020 0043   PHURINE 5.0 07/25/2020 0043   GLUCOSEU Negative 10/23/2022 0959   HGBUR NEGATIVE 07/25/2020 0043   HGBUR negative 05/15/2008 0830   BILIRUBINUR Negative 10/23/2022 0959   KETONESUR NEGATIVE 07/25/2020 0043   PROTEINUR Negative 10/23/2022 0959   PROTEINUR NEGATIVE 07/25/2020 0043   UROBILINOGEN 0.2 09/11/2014 1454   NITRITE Negative 10/23/2022 0959   NITRITE NEGATIVE 07/25/2020 0043   LEUKOCYTESUR Trace (A) 10/23/2022 0959   LEUKOCYTESUR TRACE (A) 07/25/2020 0043    Lab Results  Component Value Date   LABMICR See below: 10/23/2022   WBCUA 0-5 10/23/2022   LABEPIT 0-10 10/23/2022   BACTERIA None seen 10/23/2022    Pertinent Imaging:  Results for orders placed during the hospital encounter of 12/13/05  DG Abd 1 View  Narrative Clinical Data: 73 year old feeling bad; vomiting. Fever. Comparison: 04/26/04 and CT 04/26/04. ABDOMEN - 1 VIEW: Surgical clips are seen in the right upper quadrant. Bowel gas pattern is non-obstructive. There is no evidence for free intraperitoneal air on the supine view. Degenerative changes are noted in the spine. No abnormal calcifications identified.  Impression No evidence for bowel obstruction.  Provider: Maurice March  No results found for this or any previous visit.  No results found for this or any previous visit.  No results found for this or any previous visit.  Results for orders placed during the hospital encounter of 10/17/08  US Renal  Narrative Clinical Data: A R S/anemia.  Renal failure  RENAL/URINARY TRACT ULTRASOUND  Technique:  Complete ultrasound examination of the urinary tract was performed including evaluation of the kidneys,  renal collecting systems, and urinary bladder.  Comparison: 08/29/2008 ultrasound  Findings: The right and left kidneys measure 13.2 and 12.0  cm in length, respectively.  Bilateral increased echogenicity of the renal cortex.  No renal mass, focal scarring, or hydronephrosis. Bladder unremarkable.  Prostate gland prominent.  IMPRESSION: Findings compatible with nonspecific renal medical disease.  No hydronephrosis.  Provider: Vertell Novak  No results found for this or any previous visit.  No results found for this or any previous visit.  No results found for this or any previous visit.   Assessment & Plan:    1. Prostate cancer (HCC) (Primary) Followup 3 months with PSA  2. Benign prostatic hyperplasia with urinary obstruction Continue flomax 0.4mg  daily  3. Nocturia Continue flomax   No follow-ups on file.  Wilkie Aye, MD  [facility] [place]

## 2023-11-19 NOTE — Patient Instructions (Signed)

## 2023-11-25 ENCOUNTER — Encounter (HOSPITAL_COMMUNITY)
Admission: RE | Admit: 2023-11-25 | Discharge: 2023-11-25 | Disposition: A | Discharge status: Home / Self Care | Source: Ambulatory Visit | Attending: Internal Medicine | Admitting: Internal Medicine

## 2023-11-25 ENCOUNTER — Encounter: Payer: Self-pay | Admitting: Urology

## 2023-11-25 ENCOUNTER — Other Ambulatory Visit: Payer: Self-pay

## 2023-11-25 ENCOUNTER — Encounter (HOSPITAL_COMMUNITY): Payer: Self-pay

## 2023-11-29 ENCOUNTER — Ambulatory Visit (HOSPITAL_COMMUNITY): Admitting: Anesthesiology

## 2023-11-29 ENCOUNTER — Encounter (HOSPITAL_COMMUNITY): Payer: Self-pay | Admitting: Internal Medicine

## 2023-11-29 ENCOUNTER — Ambulatory Visit (HOSPITAL_COMMUNITY)
Admission: RE | Admit: 2023-11-29 | Discharge: 2023-11-29 | Disposition: A | Discharge status: Home / Self Care | Attending: Internal Medicine | Admitting: Internal Medicine

## 2023-11-29 ENCOUNTER — Other Ambulatory Visit: Payer: Self-pay | Admitting: Orthopaedic Surgery

## 2023-11-29 ENCOUNTER — Other Ambulatory Visit: Payer: Self-pay

## 2023-11-29 ENCOUNTER — Encounter (HOSPITAL_COMMUNITY)
Admission: RE | Disposition: A | Discharge status: Home / Self Care | Payer: Self-pay | Source: Home / Self Care | Attending: Internal Medicine

## 2023-11-29 DIAGNOSIS — K317 Polyp of stomach and duodenum: Secondary | ICD-10-CM | POA: Diagnosis not present

## 2023-11-29 DIAGNOSIS — D649 Anemia, unspecified: Secondary | ICD-10-CM

## 2023-11-29 DIAGNOSIS — K2940 Chronic atrophic gastritis without bleeding: Secondary | ICD-10-CM

## 2023-11-29 DIAGNOSIS — Z85028 Personal history of other malignant neoplasm of stomach: Secondary | ICD-10-CM | POA: Diagnosis not present

## 2023-11-29 DIAGNOSIS — F1010 Alcohol abuse, uncomplicated: Secondary | ICD-10-CM

## 2023-11-29 DIAGNOSIS — K2950 Unspecified chronic gastritis without bleeding: Secondary | ICD-10-CM

## 2023-11-29 DIAGNOSIS — Z923 Personal history of irradiation: Secondary | ICD-10-CM | POA: Diagnosis not present

## 2023-11-29 DIAGNOSIS — Z79890 Hormone replacement therapy (postmenopausal): Secondary | ICD-10-CM | POA: Diagnosis not present

## 2023-11-29 DIAGNOSIS — Z8546 Personal history of malignant neoplasm of prostate: Secondary | ICD-10-CM | POA: Diagnosis not present

## 2023-11-29 DIAGNOSIS — K648 Other hemorrhoids: Secondary | ICD-10-CM | POA: Diagnosis not present

## 2023-11-29 DIAGNOSIS — K31A0 Gastric intestinal metaplasia, unspecified: Secondary | ICD-10-CM | POA: Diagnosis not present

## 2023-11-29 DIAGNOSIS — K259 Gastric ulcer, unspecified as acute or chronic, without hemorrhage or perforation: Secondary | ICD-10-CM

## 2023-11-29 DIAGNOSIS — E039 Hypothyroidism, unspecified: Secondary | ICD-10-CM | POA: Diagnosis not present

## 2023-11-29 DIAGNOSIS — K760 Fatty (change of) liver, not elsewhere classified: Secondary | ICD-10-CM

## 2023-11-29 DIAGNOSIS — I10 Essential (primary) hypertension: Secondary | ICD-10-CM | POA: Diagnosis not present

## 2023-11-29 DIAGNOSIS — C169 Malignant neoplasm of stomach, unspecified: Secondary | ICD-10-CM | POA: Diagnosis present

## 2023-11-29 HISTORY — PX: ESOPHAGEAL BANDING: SHX5518

## 2023-11-29 HISTORY — PX: HEMOSTASIS CLIP PLACEMENT: SHX6857

## 2023-11-29 HISTORY — PX: ESOPHAGOGASTRODUODENOSCOPY: SHX5428

## 2023-11-29 HISTORY — PX: POLYPECTOMY: SHX5525

## 2023-11-29 SURGERY — EGD (ESOPHAGOGASTRODUODENOSCOPY)
Anesthesia: General

## 2023-11-29 MED ORDER — PHENYLEPHRINE 80 MCG/ML (10ML) SYRINGE FOR IV PUSH (FOR BLOOD PRESSURE SUPPORT)
PREFILLED_SYRINGE | INTRAVENOUS | Status: DC | PRN
  Administered 2023-11-29 (×2): 400 ug via INTRAVENOUS
  Administered 2023-11-29: 160 ug via INTRAVENOUS
  Administered 2023-11-29: 320 ug via INTRAVENOUS
  Administered 2023-11-29: 160 ug via INTRAVENOUS

## 2023-11-29 MED ORDER — EPHEDRINE SULFATE-NACL 50-0.9 MG/10ML-% IV SOSY
PREFILLED_SYRINGE | INTRAVENOUS | Status: DC | PRN
  Administered 2023-11-29 (×2): 15 mg via INTRAVENOUS
  Administered 2023-11-29: 10 mg via INTRAVENOUS

## 2023-11-29 MED ORDER — PROPOFOL 10 MG/ML IV BOLUS
INTRAVENOUS | Status: DC | PRN
  Administered 2023-11-29: 25 mg via INTRAVENOUS
  Administered 2023-11-29: 50 mg via INTRAVENOUS
  Administered 2023-11-29: 25 mg via INTRAVENOUS
  Administered 2023-11-29: 80 mg via INTRAVENOUS
  Administered 2023-11-29: 50 mg via INTRAVENOUS
  Administered 2023-11-29: 25 mg via INTRAVENOUS

## 2023-11-29 MED ORDER — PANTOPRAZOLE SODIUM 40 MG PO TBEC: 40.0000 mg | DELAYED_RELEASE_TABLET | Freq: Two times a day (BID) | ORAL | 11 refills | Status: DC

## 2023-11-29 MED ORDER — LIDOCAINE HCL (CARDIAC) PF 100 MG/5ML IV SOSY
PREFILLED_SYRINGE | INTRAVENOUS | Status: DC | PRN
  Administered 2023-11-29: 100 mg via INTRATRACHEAL

## 2023-11-29 MED ORDER — LACTATED RINGERS IV SOLN: INTRAVENOUS | Status: DC | PRN

## 2023-11-29 MED ORDER — SODIUM CHLORIDE 0.9% FLUSH: 3.0000 mL | Freq: Two times a day (BID) | INTRAVENOUS | Status: DC

## 2023-11-29 MED ORDER — SODIUM CHLORIDE 0.9% FLUSH: 3.0000 mL | INTRAVENOUS | Status: DC | PRN

## 2023-11-29 NOTE — Anesthesia Postprocedure Evaluation (Signed)
 Anesthesia Post Note  Patient: [name]  Procedure(s) Performed: EGD (ESOPHAGOGASTRODUODENOSCOPY) ESOPHAGOSCOPY, WITH ESOPHAGEAL VARICES BAND LIGATION POLYPECTOMY CONTROL OF HEMORRHAGE, GI TRACT, ENDOSCOPIC, BY CLIPPING OR OVERSEWING  Patient location during evaluation: PACU Anesthesia Type: General Level of consciousness: awake and alert Pain management: pain level controlled Vital Signs Assessment: post-procedure vital signs reviewed and stable Respiratory status: spontaneous breathing, nonlabored ventilation, respiratory function stable and patient connected to nasal cannula oxygen Cardiovascular status: blood pressure returned to baseline and stable Postop Assessment: no apparent nausea or vomiting Anesthetic complications: no   There were no known notable events for this encounter.   Last Vitals:  Vitals:   11/29/23 1135 11/29/23 1349  BP: 112/71 111/63  Pulse: 84 95  Resp: 13 (!) 22  Temp: 36.7 C 36.4 C  SpO2: 100% 100%    Last Pain:  Vitals:   11/29/23 1349  TempSrc: Oral  PainSc: 0-No pain                 Syvilla Martin L Gael Delude

## 2023-11-29 NOTE — Op Note (Signed)
 [facility] Patient Name: [name] Procedure Date: 11/29/2023 12:17 PM MRN: [id number] Date of Birth: [birth year] Attending MD: Hennie Duos. Marletta Lor , [place], [phone] CSN: [id number] Age: 73 Admit Type: Outpatient Procedure:                Upper GI endoscopy Indications:              Personal history of malignant gastric neoplasm,                            History of gastric neuroendocrine tumor Providers:                Hennie Duos. Marletta Lor, DO, Tammy Vaught, RN, Dyann Ruddle Referring MD:              Medicines:                See the Anesthesia note for documentation of the                            administered medications Complications:            No immediate complications. Estimated Blood Loss:     Estimated blood loss was minimal. Procedure:                Pre-Anesthesia Assessment:                           - The anesthesia plan was to use monitored                            anesthesia care (MAC).                           After obtaining informed consent, the endoscope was                            passed under direct vision. Throughout the                            procedure, the patient's blood pressure, pulse, and                            oxygen saturations were monitored continuously. The                            [id number]-H190 ([id number]) scope was introduced through the                            mouth, and advanced to the second part of duodenum.                            The upper GI endoscopy was accomplished without                            difficulty. The patient  tolerated the procedure                            well. Scope In: 1:17:31 PM Scope Out: 1:44:12 PM Total Procedure Duration: 0 hours 26 minutes 41 seconds  Findings:      There is no endoscopic evidence of areas of erosion, esophagitis,       ulcerations or varices in the entire esophagus.      Diffuse moderate inflammation characterized by erythema and friability        was found in the entire examined stomach. Mild oozing occured with       passage of endoscope. Biopsies were taken with a cold forceps for       histology in the anturm and body.      Three 5 to 6 mm sessile polyps, 2 of which with active bleeding/oozing       were found in the gastric body. Given active oozing, polyps were removed       with a hot snare. Resection and retrieval were complete. Hematostasis       occurred with polypectomy itself. To prevent bleeding after the       polypectomy, two hemostatic clips were successfully placed (MR       conditional). Clip manufacturer: [employer]. There was no       bleeding at the end of the procedure. One polypectomy site not clipped.      A single 6 mm semi-pedunculated polyp with oozing/bleeding was found in       the gastric antrum. The polyp was removed with a hot snare. Resection       and retrieval were complete. Hemostasis occurred with polypectomy itself       To prevent bleeding after the polypectomy, one hemostatic clip was       successfully placed (MR conditional). Clip manufacturer: [employer]. There was no bleeding at the end of the procedure.      A single 6 mm pedunculated polyp with no bleeding and no stigmata of       recent bleeding was found in the cardia. The polyp was removed with a       hot snare. Resection and retrieval were complete.      Multiple non-bleeding superficial gastric ulcers with no stigmata of       bleeding were found in the gastric antrum. The largest lesion was 6 mm       in largest dimension.      The duodenal bulb, first portion of the duodenum and second portion of       the duodenum were normal. Impression:               - Gastritis. Biopsied.                           - Three gastric polyps. Resected and retrieved.                            Clips (MR conditional) were placed. Clip                            manufacturer: [employer].                           -  A  single gastric polyp. Resected and retrieved.                            Clip (MR conditional) was placed. Clip                            manufacturer: [employer].                           - A single gastric polyp. Resected and retrieved.                           - Non-bleeding gastric ulcers with no stigmata of                            bleeding.                           - Normal duodenal bulb, first portion of the                            duodenum and second portion of the duodenum. Moderate Sedation:      Per Anesthesia Care Recommendation:           - Patient has a contact number available for                            emergencies. The signs and symptoms of potential                            delayed complications were discussed with the                            patient. Return to normal activities tomorrow.                            Written discharge instructions were provided to the                            patient.                           - Resume previous diet.                           - Continue present medications.                           - Use Protonix (pantoprazole) 40 mg PO BID.                           - CBC in 1 week                           - Return to GI clinic in 4 weeks. Procedure Code(s):        --- Professional ---  43251, Esophagogastroduodenoscopy, flexible,                            transoral; with removal of tumor(s), polyp(s), or                            other lesion(s) by snare technique                           43239, 59, Esophagogastroduodenoscopy, flexible,                            transoral; with biopsy, single or multiple Diagnosis Code(s):        --- Professional ---                           K29.70, Gastritis, unspecified, without bleeding                           K31.7, Polyp of stomach and duodenum                           K25.9, Gastric ulcer, unspecified as acute or                             chronic, without hemorrhage or perforation                           Z85.028, Personal history of other malignant                            neoplasm of stomach CPT copyright 2022 American Medical Association. All rights reserved. The codes documented in this report are preliminary and upon coder review may  be revised to meet current compliance requirements. Hennie Duos. Marletta Lor, DO Hennie Duos. Marletta Lor, DO 11/29/2023 2:06:00 PM This report has been signed electronically. Number of Addenda: 0

## 2023-11-29 NOTE — Transfer of Care (Signed)
 Immediate Anesthesia Transfer of Care Note  Patient: [name]  Procedure(s) Performed: EGD (ESOPHAGOGASTRODUODENOSCOPY) ESOPHAGOSCOPY, WITH ESOPHAGEAL VARICES BAND LIGATION POLYPECTOMY CONTROL OF HEMORRHAGE, GI TRACT, ENDOSCOPIC, BY CLIPPING OR OVERSEWING  Patient Location: Endoscopy Unit  Anesthesia Type:General  Level of Consciousness: awake, alert , and oriented  Airway & Oxygen Therapy: Patient Spontanous Breathing  Post-op Assessment: Report given to RN and Post -op Vital signs reviewed and stable  Post vital signs: Reviewed and stable  Last Vitals:  Vitals Value Taken Time  BP 111/63 11/29/23 1349  Temp 36.4 C 11/29/23 1349  Pulse 95 11/29/23 1349  Resp 22 11/29/23 1349  SpO2 100 % 11/29/23 1349    Last Pain:  Vitals:   11/29/23 1349  TempSrc: Oral  PainSc: 0-No pain         Complications: No notable events documented.

## 2023-11-29 NOTE — Interval H&P Note (Signed)
 History and Physical Interval Note:  11/29/2023 12:21 PM  [name]  has presented today for surgery, with the diagnosis of gastric carinoid, anemia.  The various methods of treatment have been discussed with the patient and family. After consideration of risks, benefits and other options for treatment, the patient has consented to  Procedure(s) with comments: EGD (ESOPHAGOGASTRODUODENOSCOPY) (N/A) - 2:00 pm, asa 3 ESOPHAGOSCOPY, WITH ESOPHAGEAL VARICES BAND LIGATION (N/A) as a surgical intervention.  The patient's history has been reviewed, patient examined, no change in status, stable for surgery.  I have reviewed the patient's chart and labs.  Questions were answered to the patient's satisfaction.     Lanelle Bal

## 2023-11-29 NOTE — Discharge Instructions (Signed)
 EGD Discharge instructions Please read the instructions outlined below and refer to this sheet in the next few weeks. These discharge instructions provide you with general information on caring for yourself after you leave the hospital. Your doctor may also give you specific instructions. While your treatment has been planned according to the most current medical practices available, unavoidable complications occasionally occur. If you have any problems or questions after discharge, please call your doctor. ACTIVITY You may resume your regular activity but move at a slower pace for the next 24 hours.  Take frequent rest periods for the next 24 hours.  Walking will help expel (get rid of) the air and reduce the bloated feeling in your abdomen.  No driving for 24 hours (because of the anesthesia (medicine) used during the test).  You may shower.  Do not sign any important legal documents or operate any machinery for 24 hours (because of the anesthesia used during the test).  NUTRITION Drink plenty of fluids.  You may resume your normal diet.  Begin with a light meal and progress to your normal diet.  Avoid alcoholic beverages for 24 hours or as instructed by your caregiver.  MEDICATIONS You may resume your normal medications unless your caregiver tells you otherwise.  WHAT YOU CAN EXPECT TODAY You may experience abdominal discomfort such as a feeling of fullness or "gas" pains.  FOLLOW-UP Your doctor will discuss the results of your test with you.  SEEK IMMEDIATE MEDICAL ATTENTION IF ANY OF THE FOLLOWING OCCUR: Excessive nausea (feeling sick to your stomach) and/or vomiting.  Severe abdominal pain and distention (swelling).  Trouble swallowing.  Temperature over 101 F (37.8 C).  Rectal bleeding or vomiting of blood.   Your upper endoscopy revealed diffuse inflammation in your stomach as well as multiple ulcers.  I took samples of your entire stomach.  You also had 5 polyps in your  stomach, 3 of which were actively bleeding.  I removed all of these, also placed metallic clips on the bleeding sites.  Small bowel appeared normal.  Esophagus looked okay.  Going to change her omeprazole to pantoprazole 40 mg twice daily.  Avoid NSAIDs.  Await pathology results, my office will contact you.  Recommend checking your blood counts in approximately 1 week.  Follow-up in GI office in 3 to 4 weeks.  I hope you have a great rest of your week!  Hennie Duos. Marletta Lor, D.O. Gastroenterology and Hepatology [facility]

## 2023-11-29 NOTE — Anesthesia Preprocedure Evaluation (Signed)
 Anesthesia Evaluation  Patient identified by MRN, date of birth, ID band Patient awake    Reviewed: Allergy & Precautions, H&P , NPO status , Patient's Chart, lab work & pertinent test results, reviewed documented beta blocker date and time   Airway Mallampati: II  TM Distance: >3 FB Neck ROM: full    Dental no notable dental hx. (+) Dental Advisory Given, Teeth Intact   Pulmonary neg pulmonary ROS   Pulmonary exam normal breath sounds clear to auscultation       Cardiovascular Exercise Tolerance: Good hypertension, negative cardio ROS Normal cardiovascular exam Rhythm:regular Rate:Normal     Neuro/Psych negative neurological ROS  negative psych ROS   GI/Hepatic PUD,GERD  ,,(+)     substance abuse  alcohol useCarcinoid of stomach   Endo/Other  Hypothyroidism    Renal/GU Renal disease  negative genitourinary   Musculoskeletal  (+) Arthritis , Osteoarthritis,    Abdominal   Peds  Hematology  (+) Blood dyscrasia, anemia hGB 10.2   Anesthesia Other Findings   Reproductive/Obstetrics negative OB ROS                             Anesthesia Physical Anesthesia Plan  ASA: 3  Anesthesia Plan: General and General LMA   Post-op Pain Management: Minimal or no pain anticipated   Induction: Intravenous  PONV Risk Score and Plan: Ondansetron and Propofol infusion  Airway Management Planned: Natural Airway and Nasal Cannula  Additional Equipment: None  Intra-op Plan:   Post-operative Plan:   Informed Consent: I have reviewed the patients History and Physical, chart, labs and discussed the procedure including the risks, benefits and alternatives for the proposed anesthesia with the patient or authorized representative who has indicated his/her understanding and acceptance.     Dental Advisory Given  Plan Discussed with: CRNA  Anesthesia Plan Comments:        Anesthesia Quick  Evaluation

## 2023-11-30 ENCOUNTER — Other Ambulatory Visit: Payer: Self-pay | Admitting: Physician Assistant

## 2023-11-30 ENCOUNTER — Encounter (HOSPITAL_COMMUNITY): Payer: Self-pay | Admitting: Internal Medicine

## 2023-11-30 DIAGNOSIS — D649 Anemia, unspecified: Secondary | ICD-10-CM

## 2023-11-30 NOTE — Progress Notes (Signed)
 Labs to be collected next week (with same-day office visit) = CBC/D, BB sample, ferritin, iron/TIBC

## 2023-12-01 LAB — SURGICAL PATHOLOGY

## 2023-12-03 ENCOUNTER — Other Ambulatory Visit: Payer: Self-pay

## 2023-12-03 DIAGNOSIS — D696 Thrombocytopenia, unspecified: Secondary | ICD-10-CM

## 2023-12-03 DIAGNOSIS — D649 Anemia, unspecified: Secondary | ICD-10-CM

## 2023-12-03 NOTE — Progress Notes (Signed)
 [facility] [address], [place] [address]   CLINIC:  Medical Oncology/Hematology  PCP:  [name], FNP [address] [place] [address] [phone]   REASON FOR VISIT:  Follow-up for neuroendocrine carcinoid tumor  CURRENT THERAPY: Surveillance  INTERVAL HISTORY:   [name] 73 y.o. male returns for routine follow-up of neuroendocrine carcinoid tumor.  He was last seen by Rojelio Brenner PA-C on 11/01/2023.  In the interim since last visit, he had EGD with Dr. Marletta Lor on 11/29/2023, which showed gastritis, multiple gastric polyps (including some with bleeding), and nonbleeding gastric ulcers with friable tissue.  Polyp pathology revealed hyperplastic gastric polyps with erosion, inflammation, and reactive changes (negative for neuroendocrine tumor, adenomatous change, and malignancy).  Biopsy of stomach showed chronic gastritis (stomach antrum) and atrophic gastritis with diffuse intestinal metaplasia and glandular atrophy (stomach biopsy).  At today's visit, he reports feeling fair, apart from some knee pain.  He does note some decreased appetite following EGD, but this is slowly improving.  Otherwise, he is asymptomatic without any nausea/vomiting, abdominal pain, flushing, wheezing, heart palpations, constipation, diarrhea.   He denies any rectal bleeding or melena, but stool is dark after iron.  No abnormal bruising.  He denies any B symptoms such as fever, chills, night sweats, unintentional weight loss. He reports that he has not had any alcoholic drinks for the past 2 weeks.   He has 75% energy and 20% appetite. He endorses that he is maintaining a stable weight.  ASSESSMENT & PLAN:  1.  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 Darrick Penna on 08/14/2014: Gastric polyp showed low-grade neuroendocrine tumor.  Stomach biopsy showed focal involvement by low-grade neuroendocrine  tumor. - Based on pathology from 08/14/2014, there was concern of residual disease or more extensive disease within his stomach.  Octreotide scan on 09/19/2014 was negative for any avid metastases. - EGD with gastric biopsy on 03/12/2015 was negative for dysplasia or malignancy. - EGD with polypectomy by Dr. Darrick Penna on 10/08/2015: Gastric polyp with well-differentiated neuroendocrine tumor  - His case was presented at GI tumor board in Feb/March 2016 with recommendation for anatomic biopsies to guide role of surgical resection.  Anatomic biopsies throughout stomach on 04/21/2016 were negative. - Most recent EGD (11/29/2023, with Dr. Marletta Lor): Gastritis, multiple gastric polyps (including some with bleeding), and nonbleeding gastric ulcers with friable tissue.  Polyp pathology revealed hyperplastic gastric polyps with erosion, inflammation, and reactive changes (negative for neuroendocrine tumor, adenomatous change, and malignancy).  Biopsy of stomach showed chronic gastritis (stomach antrum) and atrophic gastritis with diffuse intestinal metaplasia and glandular atrophy (stomach biopsy). - He does not currently have any signs or symptoms of carcinoid syndrome. - Most recent labs (10/26/2023): Chromogranin A elevated 215.5 (most recent EGD/biopsy was negative for any neuroendocrine tumor) - note that chromogranin can be elevated in the presence of PPI, however patient did not start PPI until after this lab was checked. - PLAN: Repeat LFTs and chromogranin A in 6 months (September 2025) - If symptomatic or significantly elevated chromogranin A, consider checking 24-hour urine 5-HIAA and/or dotatate-PET **DISCUSSION WITH MD (Dr. Ellin Saba) on 12/07/2023: Reviewed patient history and pathology from most recent EGD.  Per Dr. Ellin Saba, no further imaging or EGD (for oncology surveillance), unless significant lab changes, symptoms, or other indication.  Will continue to monitor labs.  2.  Normocytic anemia +  thrombocytopenia - Mild intermittent anemia since at least 2020.  Mild  to moderate intermittent thrombocytopenia since at least 2010, with episode of severe thrombocytopenia during hospitalization in 2008. - Most recent EGD (11/29/2023, with Dr. Marletta Lor): Gastritis, multiple gastric polyps (including some with bleeding), and nonbleeding gastric ulcers with friable tissue.  Polyp pathology revealed hyperplastic gastric polyps with erosion, inflammation, and reactive changes (negative for neuroendocrine tumor, adenomatous change, and malignancy).  Biopsy of stomach showed chronic gastritis (stomach antrum) and atrophic gastritis with diffuse intestinal metaplasia and glandular atrophy (stomach biopsy). - CT abdomen/pelvis (04/24/2019): No focal liver abnormality.  Normal-sized spleen. - US abdomen (11/18/2023): Possible fatty liver infiltration.  Normal spleen. - Most recent CBC/D (10/26/2023): Hgb 10.2/MCV 99.4.  Platelets 95.  WBC 4.0/normal differential. - Hematology workup: Normal iron stores (February 2025) with ferritin 478/iron saturation 36% Creatinine 1.17/GFR >60.  Normal LDH. Low folate (4.1).  Low copper (58).  Normal B12, MMA.   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  Normal reticulocytes. -CBC/D today (12/06/2023): Hgb 10.3/MCV 99.4.  Ferritin 615, iron saturation 47%. - He denies any rectal bleeding or melena. No abnormal bruising. He denies any B symptoms such as fever, chills, night sweats, unintentional weight loss.   - He reports that he drinks "about 2 drinks" on the weekends. - He is at risk for nutritional deficiencies due to alcohol consumption.  He also had evidence of friable tissue with risk of bleeding, although his iron levels are normal.    - PLAN:  Start folic acid 1 mg daily + copper 4 mg daily  - Patient can STOP iron pill at this time.  - RTC in 3 months with repeat CBC/D, folate, copper, ferritin, iron/TIBC.  We  will also check inflammatory/autoimmune labs with RF, ANA, ESR, CRP. - If persistent anemia and thrombocytopenia of unclear etiology, would consider bone marrow biopsy.    3.  Abnormal SPEP - Workup of anemia and thrombocytopenia (11/01/2023) revealed the following: SPEP with M spike 0.6%. Serum immunofixation shows polyclonal increase in 1 or more immunoglobulins Normal FLC ratio 1.22, but elevated kappa 108.8 and elevated lambda 88.9 - PLAN: Recommended that patient complete 24-hour urine/UPEP and skeletal survey, but he would like to hold off on this for now because he believes that "we are running too many tests."  We will discuss this again at his next visit. - Will plan on rechecking MGUS/myeloma panel (in addition to beta-2 microglobulin) in 6 months (around September/October 2025).  4.  Pulmonary nodule: - 6 mm LUL pulmonary nodule on neck PET scan done on 10/18/2018. - CT chest from September 2020 revealed stable left lung nodule. - Repeat CT chest from 07/11/2020 showed interval development of multiple patchy areas of groundglass attenuation scattered throughout both lungs.  Other nonspecific these are favored to represent sequelae of inflammation or infection.  Atypical viral infection not excluded.  This is likely related to recent COVID-19 diagnosis.  The 7 mm pulmonary nodule remains stable.  Thought to be a benign perifissural lymph node.  5.  Prostate cancer: - Prostate biopsy (03/08/2023): Right lateral base Gleason 3+4=7, grade group 2, 5% of 1 core, PSA 6.0 - Placement of SpaceOAR by Dr. Ronne Binning on 06/10/2023.  Completed prostatic radiation with Dr. Langston Masker in [place] from 06/30/2023 through 08/10/2023 - Most recent PSA (10/26/2023) within normal limits at 0.78 - PLAN: Continue follow-up with urology and radiation oncology   PLAN SUMMARY:  >>  Labs in 3 months = CBC/D, folate, copper, ferritin, iron/TIBC, RF, ANA, ESR, CRP >> OFFICE  visit in 3 months (1 week after labs)      REVIEW OF SYSTEMS:  Review of Systems  Constitutional:  Positive for appetite change. Negative for chills, diaphoresis, fatigue, fever and unexpected weight change.  HENT:   Negative for lump/mass and nosebleeds.   Eyes:  Negative for eye problems.  Respiratory:  Negative for cough, hemoptysis and shortness of breath.   Cardiovascular:  Negative for chest pain, leg swelling and palpitations.  Gastrointestinal:  Negative for abdominal pain, blood in stool, constipation, diarrhea, nausea and vomiting.  Genitourinary:  Negative for hematuria.   Musculoskeletal:  Positive for arthralgias.  Skin: Negative.   Neurological:  Negative for dizziness, headaches and light-headedness.  Hematological:  Does not bruise/bleed easily.     PHYSICAL EXAM:  ECOG PERFORMANCE STATUS: 1 - Symptomatic but completely ambulatory  Vitals:   12/06/23 0927  BP: 90/64  Pulse: (!) 101  Resp: 18  Temp: 97.8 F (36.6 C)  SpO2: 100%    Filed Weights   12/06/23 0927  Weight: 187 lb 3.2 oz (84.9 kg)    Physical Exam Vitals reviewed.  Constitutional:      Appearance: Normal appearance.  Cardiovascular:     Rate and Rhythm: Regular rhythm. Tachycardia present.     Heart sounds: Normal heart sounds.  Pulmonary:     Breath sounds: Wheezing (faint wheezing of bilateral lower lobes) present.  Neurological:     Mental Status: He is alert.  Psychiatric:        Mood and Affect: Mood normal.        Behavior: Behavior normal.    PAST MEDICAL/SURGICAL HISTORY:  Past Medical History:  Diagnosis Date   Anemia    FeDA: GASTRIC POLYPS, B12; TCS 2008 EGD 2009, 2008-HB 11.1 MCV 83.6 CR 1.22, 2009 FERRITIN 102-22   B12 deficiency    Carcinoid tumor determined by biopsy of stomach 09/25/2014   Chronic knee pain    Diverticulosis of colon    Lower GI bleed 2008   Essential hypertension    GERD (gastroesophageal reflux disease)    Gout    Hepatomegaly    Hepatic steatosis   History of alcohol abuse     History of septic arthritis    Hypothyroidism    PUD    Substance abuse (HCC)    Past Surgical History:  Procedure Laterality Date   BIOPSY  08/14/2014   Procedure: BIOPSY;  Surgeon: West Bali, MD;  Location: [facility];  Service: Endoscopy;;   BIOPSY  03/12/2015   Procedure: BIOPSY;  Surgeon: West Bali, MD;  Location: [facility];  Service: Endoscopy;;   BIOPSY  04/21/2016   Procedure: BIOPSY;  Surgeon: West Bali, MD;  [facility];  Service: Endoscopy;;  bx's of antrum, body of stomach, fundus, and cardia   CHOLECYSTECTOMY     COLONOSCOPY  2008 [facility] DJ   LGIB 2o to TICS, prep good   COLONOSCOPY WITH PROPOFOL N/A 08/14/2014   SLF: 1. Four large colon polyps removed. 2. The left colon is redundant 3. Moderate diverticulosis throughout teh entire examined colon   COLONOSCOPY WITH PROPOFOL N/A 11/06/2014   SLF: 8 small polyps removed. One large pedunculated polyp removed from the ascending colon, tubulovillous and tubular adenomas. Next colonoscopy March 2019   COLONOSCOPY WITH PROPOFOL N/A 01/04/2018   Procedure: COLONOSCOPY WITH PROPOFOL;  Surgeon: West Bali, MD;  [facility];  Service: Endoscopy;  Laterality: N/A;  10:00am   ESOPHAGEAL BANDING N/A 11/29/2023  Procedure: ESOPHAGOSCOPY, WITH ESOPHAGEAL VARICES BAND LIGATION;  Surgeon: Lanelle Bal, DO;  [facility];  Service: Endoscopy;  Laterality: N/A;   ESOPHAGOGASTRODUODENOSCOPY  12/08/2007   ZOX:WRUE gastric polyps seen in the cardia and body of the stomach/Normal esophagus without evidence of Barrett's, mass, erosion/Normal duodenal bulb and second portion of the duodenum. Benign bx.   ESOPHAGOGASTRODUODENOSCOPY N/A 11/29/2023   Procedure: EGD (ESOPHAGOGASTRODUODENOSCOPY);  Surgeon: Lanelle Bal, DO;  [facility];  Service: Endoscopy;  Laterality: N/A;  2:00 pm, asa 3   ESOPHAGOGASTRODUODENOSCOPY (EGD) WITH PROPOFOL N/A 08/14/2014   SLF: 1. Heme postive stools due to  gastric and colon polyps 2. Multiple gastric    ESOPHAGOGASTRODUODENOSCOPY (EGD) WITH PROPOFOL N/A 03/12/2015   SLF: Multiple gastric nodules seen in gasric body/antrum. 2. Non-erosive gastritis ( inflammation) was found in the gastric antrum.    ESOPHAGOGASTRODUODENOSCOPY (EGD) WITH PROPOFOL N/A 10/08/2015   Procedure: ESOPHAGOGASTRODUODENOSCOPY (EGD) WITH PROPOFOL;  Surgeon: West Bali, MD;  [facility];  Service: Endoscopy;  Laterality: N/A;  0945   ESOPHAGOGASTRODUODENOSCOPY (EGD) WITH PROPOFOL N/A 04/21/2016   Procedure: ESOPHAGOGASTRODUODENOSCOPY (EGD) WITH PROPOFOL;  Surgeon: West Bali, MD;  [facility];  Service: Endoscopy;  Laterality: N/A;  730    GOLD SEED IMPLANT N/A 06/10/2023   Procedure: GOLD SEED IMPLANT;  Surgeon: Malen Gauze, MD;  Location: [facility];  Service: Urology;  Laterality: N/A;   HAND SURGERY Right    fracture repair with plates   HEMOSTASIS CLIP PLACEMENT  11/29/2023   Procedure: CONTROL OF HEMORRHAGE, GI TRACT, ENDOSCOPIC, BY CLIPPING OR OVERSEWING;  Surgeon: Lanelle Bal, DO;  [facility];  Service: Endoscopy;;   INGUINAL HERNIA REPAIR Left 11/09/2018   Procedure: HERNIA REPAIR INGUINAL ADULT WITH MESH;  Surgeon: Lucretia Roers, MD;  Location: [facility];  Service: General;  Laterality: Left;   JOINT REPLACEMENT Bilateral    knees   POLYPECTOMY  08/14/2014   Procedure: POLYPECTOMY;  Surgeon: West Bali, MD;  Location: [facility];  Service: Endoscopy;;   POLYPECTOMY N/A 11/06/2014   Procedure: POLYPECTOMY;  Surgeon: West Bali, MD;  Location: [facility];  Service: Endoscopy;  Laterality: N/A;  Transverse Colon x3, Ascending Colon x2, Descending Colon x3   POLYPECTOMY  11/29/2023   Procedure: POLYPECTOMY;  Surgeon: Lanelle Bal, DO;  [facility];  Service: Endoscopy;;   REPLACEMENT TOTAL KNEE BILATERAL     SPACE OAR INSTILLATION N/A 06/10/2023   Procedure: SPACE OAR INSTILLATION;  Surgeon: Malen Gauze, MD;  Location: [facility];  Service: Urology;  Laterality: N/A;   UPPER GASTROINTESTINAL ENDOSCOPY  AP[facility] 2009   INFLAMED HYPERPLASTIC POLYPS, CHRONIC GASTRITIS    SOCIAL HISTORY:  Social History   Socioeconomic History   Marital status: Single    Spouse name: Not on file   Number of children: 0   Years of education: 3   Highest education level: Not on file  Occupational History   Occupation: retired    Comment: farming  Tobacco Use   Smoking status: Never    Passive exposure: Never   Smokeless tobacco: Never   Tobacco comments:    Quit x 10 years/ never smoked on regular basis  Vaping Use   Vaping status: Never Used  Substance and Sexual Activity   Alcohol use: Yes    Alcohol/week: 2.0 standard drinks of alcohol    Types: 2 Shots of liquor per week    Comment:  drinks on weekends, gin/vodka 1/5th.   Drug use: No   Sexual activity: Never  Other Topics Concern   Not on file  Social History Narrative   HE DOES NOT HAVE ANY CHILDREN.   Lives alone   Does not drive   CAN NOT READ   Social Drivers of Health   Financial Resource Strain: Low Risk  (07/30/2020)   Overall Financial Resource Strain (CARDIA)    Difficulty of Paying Living Expenses: Not hard at all  Food Insecurity: No Food Insecurity (04/13/2023)   Hunger Vital Sign    Worried About Running Out of Food in the Last Year: Never true    Ran Out of Food in the Last Year: Never true  Transportation Needs: No Transportation Needs (04/13/2023)   PRAPARE - Administrator, Civil Service (Medical): No    Lack of Transportation (Non-Medical): No  Physical Activity: Insufficiently Active (07/30/2020)   Exercise Vital Sign    Days of Exercise per Week: 7 days    Minutes of Exercise per Session: 20 min  Stress: No Stress Concern Present (07/30/2020)   [employer] of Occupational Health - Occupational Stress Questionnaire    Feeling of Stress : Not at all  Social Connections: Socially Isolated  (07/30/2020)   Social Connection and Isolation Panel [NHANES]    Frequency of Communication with Friends and Family: Never    Frequency of Social Gatherings with Friends and Family: Once a week    Attends Religious Services: Never    Database administrator or Organizations: No    Attends Banker Meetings: Never    Marital Status: Never married  Intimate Partner Violence: Not At Risk (04/13/2023)   Humiliation, Afraid, Rape, and Kick questionnaire    Fear of Current or Ex-Partner: No    Emotionally Abused: No    Physically Abused: No    Sexually Abused: No    FAMILY HISTORY:  Family History  Problem Relation Age of Onset   Diabetes Mother    Renal Disease Mother        failure   Diabetes Father        'old age'   Hypertension Sister    Hypertension Brother    Diabetes Brother    Diabetes Sister    Diabetes Sister    Diabetes Brother    Hypertension Brother    Diabetes Brother        right BKA   Kidney failure Brother        on dialysis   Kidney failure Brother        on dialysis   Diabetes Brother        bilateral BKA   Colon polyps Neg Hx    Colon cancer Neg Hx    Pancreatic disease Neg Hx     CURRENT MEDICATIONS:  Outpatient Encounter Medications as of 12/06/2023  Medication Sig Note   amLODipine (NORVASC) 10 MG tablet Take 10 mg by mouth daily.    atorvastatin (LIPITOR) 20 MG tablet Take 1 tablet (20 mg total) by mouth daily.    clindamycin (CLEOCIN T) 1 % external solution Apply 1 Application topically daily. Apply to back of neck    colchicine 0.6 MG tablet TAKE 1 TABLET BY MOUTH 3 TIMES DAILY FOR 5 DAYS FOR GOUT PAIN.    Copper Gluconate 2 MG TABS Take 2 tablets (4 mg total) by mouth daily.    cyanocobalamin (VITAMIN B12) 1000 MCG tablet Take 1,000 mcg by mouth  daily.    ferrous sulfate 325 (65 FE) MG tablet Take 325 mg by mouth daily.    folic acid (FOLVITE) 1 MG tablet Take 1 tablet (1 mg total) by mouth daily.    HYDROcodone-acetaminophen  (NORCO) 7.5-325 MG tablet One tablet every six hours as needed for pain.    levothyroxine (SYNTHROID) 50 MCG tablet Take 50 mcg by mouth every morning. 10/06/2023: Take with 200 mcg   levothyroxine (SYNTHROID, LEVOTHROID) 200 MCG tablet Take 1 tablet (200 mcg total) by mouth daily before breakfast.    metoprolol succinate (TOPROL-XL) 25 MG 24 hr tablet Take 1.5 tablets (37.5 mg total) by mouth daily.    pantoprazole (PROTONIX) 40 MG tablet Take 1 tablet (40 mg total) by mouth 2 (two) times daily.    potassium chloride (MICRO-K) 10 MEQ CR capsule Take 10 mEq by mouth daily.    tamsulosin (FLOMAX) 0.4 MG CAPS capsule Take 1 capsule (0.4 mg total) by mouth daily.    No facility-administered encounter medications on file as of 12/06/2023.    ALLERGIES:  Allergies  Allergen Reactions   Aspirin Other (See Comments)    Diverticular Bleed    LABORATORY DATA:  I have reviewed the labs as listed.  CBC    Component Value Date/Time   WBC 6.6 12/06/2023 0844   RBC 3.12 (L) 12/06/2023 0844   HGB 10.3 (L) 12/06/2023 0844   HCT 31.0 (L) 12/06/2023 0844   HCT 44 01/05/2013 0000   PLT 120 (L) 12/06/2023 0844   PLT 148 01/05/2013 0000   MCV 99.4 12/06/2023 0844   MCV 90.4 01/05/2013 0000   MCH 33.0 12/06/2023 0844   MCHC 33.2 12/06/2023 0844   RDW 14.7 12/06/2023 0844   LYMPHSABS 1.9 12/06/2023 0844   MONOABS 0.6 12/06/2023 0844   EOSABS 0.2 12/06/2023 0844   BASOSABS 0.0 12/06/2023 0844      Latest Ref Rng & Units 10/26/2023   11:02 AM 10/06/2023   11:21 AM 03/22/2023   10:26 AM  CMP  Glucose 70 - 99 mg/dL 664  403  474   BUN 8 - 23 mg/dL 16  12  16    Creatinine 0.61 - 1.24 mg/dL 2.59  5.63  8.75   Sodium 135 - 145 mmol/L 136  133  135   Potassium 3.5 - 5.1 mmol/L 4.7  3.8  4.2   Chloride 98 - 111 mmol/L 104  95  104   CO2 22 - 32 mmol/L 25  26  26    Calcium 8.9 - 10.3 mg/dL 9.1  9.1  8.9   Total Protein 6.5 - 8.1 g/dL 7.5   7.5   Total Bilirubin 0.0 - 1.2 mg/dL 1.0   1.4   Alkaline  Phos 38 - 126 U/L 83   48   AST 15 - 41 U/L 62   45   ALT 0 - 44 U/L 41   29     DIAGNOSTIC IMAGING:  I have independently reviewed the relevant imaging and discussed with the patient.   WRAP UP:  All questions were answered. The patient knows to call the clinic with any problems, questions or concerns.  Medical decision making: Moderate  Time spent on visit: I spent 20 minutes counseling the patient face to face. The total time spent in the appointment was 30 minutes and more than 50% was on counseling.  Carnella Guadalajara, PA-C  12/06/23 10:25 AM

## 2023-12-06 ENCOUNTER — Inpatient Hospital Stay (HOSPITAL_BASED_OUTPATIENT_CLINIC_OR_DEPARTMENT_OTHER): Admitting: Physician Assistant

## 2023-12-06 ENCOUNTER — Inpatient Hospital Stay: Attending: Hematology

## 2023-12-06 VITALS — BP 90/64 | HR 101 | Temp 97.8°F | Resp 18 | Wt 187.2 lb

## 2023-12-06 DIAGNOSIS — D649 Anemia, unspecified: Secondary | ICD-10-CM

## 2023-12-06 DIAGNOSIS — C61 Malignant neoplasm of prostate: Secondary | ICD-10-CM | POA: Insufficient documentation

## 2023-12-06 DIAGNOSIS — E610 Copper deficiency: Secondary | ICD-10-CM

## 2023-12-06 DIAGNOSIS — D696 Thrombocytopenia, unspecified: Secondary | ICD-10-CM | POA: Diagnosis not present

## 2023-12-06 DIAGNOSIS — C7A092 Malignant carcinoid tumor of the stomach: Secondary | ICD-10-CM | POA: Diagnosis present

## 2023-12-06 DIAGNOSIS — E538 Deficiency of other specified B group vitamins: Secondary | ICD-10-CM

## 2023-12-06 DIAGNOSIS — C7A8 Other malignant neuroendocrine tumors: Secondary | ICD-10-CM

## 2023-12-06 LAB — CBC WITH DIFFERENTIAL/PLATELET
Abs Immature Granulocytes: 0.01 10*3/uL (ref 0.00–0.07)
Basophils Absolute: 0 10*3/uL (ref 0.0–0.1)
Basophils Relative: 1 %
Eosinophils Absolute: 0.2 10*3/uL (ref 0.0–0.5)
Eosinophils Relative: 3 %
HCT: 31 % — ABNORMAL LOW (ref 39.0–52.0)
Hemoglobin: 10.3 g/dL — ABNORMAL LOW (ref 13.0–17.0)
Immature Granulocytes: 0 %
Lymphocytes Relative: 29 %
Lymphs Abs: 1.9 10*3/uL (ref 0.7–4.0)
MCH: 33 pg (ref 26.0–34.0)
MCHC: 33.2 g/dL (ref 30.0–36.0)
MCV: 99.4 fL (ref 80.0–100.0)
Monocytes Absolute: 0.6 10*3/uL (ref 0.1–1.0)
Monocytes Relative: 9 %
Neutro Abs: 3.9 10*3/uL (ref 1.7–7.7)
Neutrophils Relative %: 58 %
Platelets: 120 10*3/uL — ABNORMAL LOW (ref 150–400)
RBC: 3.12 MIL/uL — ABNORMAL LOW (ref 4.22–5.81)
RDW: 14.7 % (ref 11.5–15.5)
WBC: 6.6 10*3/uL (ref 4.0–10.5)
nRBC: 0 % (ref 0.0–0.2)

## 2023-12-06 LAB — IRON AND TIBC
Iron: 87 ug/dL (ref 45–182)
Saturation Ratios: 47 % — ABNORMAL HIGH (ref 17.9–39.5)
TIBC: 185 ug/dL — ABNORMAL LOW (ref 250–450)
UIBC: 98 ug/dL

## 2023-12-06 LAB — SAMPLE TO BLOOD BANK

## 2023-12-06 LAB — FERRITIN: Ferritin: 615 ng/mL — ABNORMAL HIGH (ref 24–336)

## 2023-12-06 LAB — PSA: Prostatic Specific Antigen: 0.6 ng/mL (ref 0.00–4.00)

## 2023-12-06 MED ORDER — COPPER GLUCONATE 2 MG PO TABS: 4.0000 mg | ORAL_TABLET | Freq: Every day | ORAL | 3 refills | Status: DC

## 2023-12-06 MED ORDER — FOLIC ACID 1 MG PO TABS: 1.0000 mg | ORAL_TABLET | Freq: Every day | ORAL | 3 refills | Status: DC

## 2023-12-06 NOTE — Patient Instructions (Addendum)
 [facility] [place] **VISIT SUMMARY & IMPORTANT INSTRUCTIONS **   You were seen today by Rojelio Brenner PA-C for your follow-up visit.    NEUROENDOCRINE CARCINOID TUMOR OF THE STOMACH: You had low-grade cancerous tumor in your stomach in 2015 and 2017. Your most recent EGD (March 2025) did not show any evidence of recurrent cancer in your stomach. We will continue to monitor you due to your risk of recurrent cancer.  We will check these labs and see you for office visit at least once every 6 months.  ANEMIA & THROMBOCYTOPENIA Your red blood cells and platelets are lower than normal. This may be due to low copper and low folic acid levels. Start taking FOLATE ACID 1 mg daily + COPPER 4 mg daily. Alcohol consumption can cause vitamin deficiencies and anemia.  We recommend that you do not drink any alcohol at this time. We will recheck these labs in 3 months.  MGUS ("monoclonal gammopathy of undetermined significance") As we discussed, this is a "precancerous" condition where you have slightly increased plasma cells making a slightly increased amount of abnormal immunoglobulin proteins. Although MGUS is not causing any current problems in your body, it has a 1% each year risk of progression to multiple myeloma cancer.  FOLLOW-UP APPOINTMENT: 3 months  ** Thank you for trusting me with your healthcare!  I strive to provide all of my patients with quality care at each visit.  If you receive a survey for this visit, I would be so grateful to you for taking the time to provide feedback.  Thank you in advance!  ~ Rett Stehlik                   Dr. Doreatha Massed   &   Rojelio Brenner, PA-C   - - - - - - - - - - - - - - - - - -    Thank you for choosing [facility] at [facility] to provide your oncology and hematology care.  To afford each patient quality time with our provider, please arrive at least 15 minutes before your scheduled  appointment time.   If you have a lab appointment with the Cancer Center please come in thru the Main Entrance and check in at the main information desk.  You need to re-schedule your appointment should you arrive 10 or more minutes late.  We strive to give you quality time with our providers, and arriving late affects you and other patients whose appointments are after yours.  Also, if you no show three or more times for appointments you may be dismissed from the clinic at the providers discretion.     Again, thank you for choosing [facility].  Our hope is that these requests will decrease the amount of time that you wait before being seen by our physicians.       _____________________________________________________________  Should you have questions after your visit to [facility], please contact our office at [phone] and follow the prompts.  Our office hours are 8:00 a.m. and 4:30 p.m. Monday - Friday.  Please note that voicemails left after 4:00 p.m. may not be returned until the following business day.  We are closed weekends and major holidays.  You do have access to a nurse 24-7, just call the main number to the clinic [phone] and do not press any options, hold on the line and a nurse will answer  the phone.    For prescription refill requests, have your pharmacy contact our office and allow 72 hours.

## 2023-12-07 ENCOUNTER — Inpatient Hospital Stay: Admitting: Physician Assistant

## 2023-12-07 ENCOUNTER — Inpatient Hospital Stay

## 2023-12-08 ENCOUNTER — Telehealth: Payer: Self-pay | Admitting: Orthopaedic Surgery

## 2023-12-08 NOTE — Telephone Encounter (Signed)
 Dr. Sanjuan Dame pt - pt presented to the office requesting a refill for Hydrocodone 7.5-325 to be sent to [facility]

## 2023-12-09 MED ORDER — HYDROCODONE-ACETAMINOPHEN 7.5-325 MG PO TABS: ORAL_TABLET | ORAL | 0 refills | Status: DC

## 2023-12-14 ENCOUNTER — Inpatient Hospital Stay

## 2023-12-14 ENCOUNTER — Inpatient Hospital Stay: Admitting: Physician Assistant

## 2023-12-16 ENCOUNTER — Other Ambulatory Visit (HOSPITAL_COMMUNITY): Payer: Self-pay

## 2023-12-16 DIAGNOSIS — R7989 Other specified abnormal findings of blood chemistry: Secondary | ICD-10-CM

## 2023-12-27 ENCOUNTER — Ambulatory Visit: Admitting: Gastroenterology

## 2023-12-29 ENCOUNTER — Encounter: Payer: Self-pay | Admitting: Gastroenterology

## 2023-12-29 NOTE — Telephone Encounter (Signed)
 [name]

## 2023-12-30 ENCOUNTER — Other Ambulatory Visit: Payer: Self-pay

## 2023-12-30 ENCOUNTER — Emergency Department (HOSPITAL_COMMUNITY)
Admission: EM | Admit: 2023-12-30 | Discharge: 2023-12-30 | Disposition: A | Discharge status: Home / Self Care | Attending: Emergency Medicine | Admitting: Emergency Medicine

## 2023-12-30 ENCOUNTER — Encounter (HOSPITAL_COMMUNITY): Payer: Self-pay

## 2023-12-30 DIAGNOSIS — D72819 Decreased white blood cell count, unspecified: Secondary | ICD-10-CM | POA: Insufficient documentation

## 2023-12-30 DIAGNOSIS — I5030 Unspecified diastolic (congestive) heart failure: Secondary | ICD-10-CM | POA: Insufficient documentation

## 2023-12-30 DIAGNOSIS — I110 Hypertensive heart disease with heart failure: Secondary | ICD-10-CM | POA: Insufficient documentation

## 2023-12-30 DIAGNOSIS — M7989 Other specified soft tissue disorders: Secondary | ICD-10-CM | POA: Diagnosis present

## 2023-12-30 DIAGNOSIS — E039 Hypothyroidism, unspecified: Secondary | ICD-10-CM | POA: Insufficient documentation

## 2023-12-30 DIAGNOSIS — R600 Localized edema: Secondary | ICD-10-CM | POA: Diagnosis not present

## 2023-12-30 DIAGNOSIS — Z79899 Other long term (current) drug therapy: Secondary | ICD-10-CM | POA: Diagnosis not present

## 2023-12-30 LAB — CBC WITH DIFFERENTIAL/PLATELET
Abs Immature Granulocytes: 0.01 10*3/uL (ref 0.00–0.07)
Basophils Absolute: 0 10*3/uL (ref 0.0–0.1)
Basophils Relative: 0 %
Eosinophils Absolute: 0.1 10*3/uL (ref 0.0–0.5)
Eosinophils Relative: 3 %
HCT: 27.5 % — ABNORMAL LOW (ref 39.0–52.0)
Hemoglobin: 9 g/dL — ABNORMAL LOW (ref 13.0–17.0)
Immature Granulocytes: 0 %
Lymphocytes Relative: 34 %
Lymphs Abs: 1.2 10*3/uL (ref 0.7–4.0)
MCH: 33.5 pg (ref 26.0–34.0)
MCHC: 32.7 g/dL (ref 30.0–36.0)
MCV: 102.2 fL — ABNORMAL HIGH (ref 80.0–100.0)
Monocytes Absolute: 0.6 10*3/uL (ref 0.1–1.0)
Monocytes Relative: 15 %
Neutro Abs: 1.8 10*3/uL (ref 1.7–7.7)
Neutrophils Relative %: 48 %
Platelets: 93 10*3/uL — ABNORMAL LOW (ref 150–400)
RBC: 2.69 MIL/uL — ABNORMAL LOW (ref 4.22–5.81)
RDW: 14.7 % (ref 11.5–15.5)
WBC: 3.7 10*3/uL — ABNORMAL LOW (ref 4.0–10.5)
nRBC: 0 % (ref 0.0–0.2)

## 2023-12-30 LAB — COMPREHENSIVE METABOLIC PANEL WITH GFR
ALT: 38 U/L (ref 0–44)
AST: 58 U/L — ABNORMAL HIGH (ref 15–41)
Albumin: 2.8 g/dL — ABNORMAL LOW (ref 3.5–5.0)
Alkaline Phosphatase: 90 U/L (ref 38–126)
Anion gap: 7 (ref 5–15)
BUN: 20 mg/dL (ref 8–23)
CO2: 22 mmol/L (ref 22–32)
Calcium: 8.6 mg/dL — ABNORMAL LOW (ref 8.9–10.3)
Chloride: 106 mmol/L (ref 98–111)
Creatinine, Ser: 1.36 mg/dL — ABNORMAL HIGH (ref 0.61–1.24)
GFR, Estimated: 55 mL/min — ABNORMAL LOW (ref >60)
Glucose, Bld: 96 mg/dL (ref 70–99)
Potassium: 3.7 mmol/L (ref 3.5–5.1)
Sodium: 135 mmol/L (ref 135–145)
Total Bilirubin: 0.9 mg/dL (ref 0.0–1.2)
Total Protein: 6.5 g/dL (ref 6.5–8.1)

## 2023-12-30 LAB — URINALYSIS, ROUTINE W REFLEX MICROSCOPIC
Bacteria, UA: NONE SEEN
Bilirubin Urine: NEGATIVE
Glucose, UA: NEGATIVE mg/dL
Hgb urine dipstick: NEGATIVE
Ketones, ur: NEGATIVE mg/dL
Nitrite: NEGATIVE
Protein, ur: NEGATIVE mg/dL
Specific Gravity, Urine: 1.006 (ref 1.005–1.030)
pH: 5 (ref 5.0–8.0)

## 2023-12-30 LAB — FERRITIN: Ferritin: 362 ng/mL — ABNORMAL HIGH (ref 24–336)

## 2023-12-30 LAB — BRAIN NATRIURETIC PEPTIDE: B Natriuretic Peptide: 126 pg/mL — ABNORMAL HIGH (ref 0.0–100.0)

## 2023-12-30 LAB — IRON AND TIBC
Iron: 49 ug/dL (ref 45–182)
Saturation Ratios: 25 % (ref 17.9–39.5)
TIBC: 198 ug/dL — ABNORMAL LOW (ref 250–450)
UIBC: 149 ug/dL

## 2023-12-30 LAB — FOLATE: Folate: 25.8 ng/mL (ref >5.9)

## 2023-12-30 LAB — RETICULOCYTES
Immature Retic Fract: 9.8 % (ref 2.3–15.9)
RBC.: 2.96 MIL/uL — ABNORMAL LOW (ref 4.22–5.81)
Retic Count, Absolute: 52.1 10*3/uL (ref 19.0–186.0)
Retic Ct Pct: 1.8 % (ref 0.4–3.1)

## 2023-12-30 LAB — VITAMIN B12: Vitamin B-12: 1004 pg/mL — ABNORMAL HIGH (ref 180–914)

## 2023-12-30 MED ORDER — FUROSEMIDE 20 MG PO TABS
20.0000 mg | ORAL_TABLET | Freq: Every day | ORAL | 0 refills | Status: DC
Start: 2023-12-30 — End: 2024-01-10

## 2023-12-30 MED ORDER — POTASSIUM CHLORIDE CRYS ER 20 MEQ PO TBCR
40.0000 meq | EXTENDED_RELEASE_TABLET | Freq: Once | ORAL | Status: AC
  Administered 2023-12-30: 40 meq via ORAL
  Filled 2023-12-30: qty 2

## 2023-12-30 MED ORDER — FUROSEMIDE 10 MG/ML IJ SOLN
20.0000 mg | Freq: Once | INTRAMUSCULAR | Status: AC
  Administered 2023-12-30: 20 mg via INTRAVENOUS
  Filled 2023-12-30: qty 2

## 2023-12-30 NOTE — ED Notes (Signed)
 See triage notes. Pt c/o swelling to both legs that started yesterday and chronic knee pain bilateral. Pt offered w/c to back but insisted on walking with his cane. Steady gait.  Pt has pitting edema 3plus from knees down bilateral. Pt denies shob. Did not have any obvious shob noted while ambulating to bed either. Pt a/o. Color wnl.

## 2023-12-30 NOTE — ED Triage Notes (Signed)
 Pt arrived via POV c/o bilateral knee pain and swelling. Pt reports this began yesterday. Pt denies injury and endorses chronic knee pain. Swelling present in bilateral lower extremities in Triage.

## 2023-12-30 NOTE — ED Provider Notes (Signed)
 [facility] AT [facility] Provider Note   CSN: [id number] Arrival date & time: 12/30/23  1237     History  Chief Complaint  Patient presents with   Knee Pain    [name] is a 73 y.o. male with PMH as listed below who presents with bilateral lower extremity edema that he noticed yesterday. Pt denies injury or falls. No h/o similar. No h/o DVT/PE, doesn't take a blood thinner. No numbness/tingling. No recent changes to medications. No Cp/SOB. Used to take lasix  but doesn't anymore. Last echo in 2018 shows indeterminate grade diastolic dysfunction with LVEF 55-60%.    Past Medical History:  Diagnosis Date   Anemia    FeDA: GASTRIC POLYPS, B12; TCS 2008 EGD 2009, 2008-HB 11.1 MCV 83.6 CR 1.22, 2009 FERRITIN 102-22   B12 deficiency    Carcinoid tumor determined by biopsy of stomach 09/25/2014   Chronic knee pain    Diverticulosis of colon    Lower GI bleed 2008   Essential hypertension    GERD (gastroesophageal reflux disease)    Gout    Hepatomegaly    Hepatic steatosis   History of alcohol abuse    History of septic arthritis    Hypothyroidism    PUD    Substance abuse (HCC)        Home Medications Prior to Admission medications   Medication Sig Start Date End Date Taking? Authorizing Provider  furosemide  (LASIX ) 20 MG tablet Take 1 tablet (20 mg total) by mouth daily for 3 days. 12/30/23 01/02/24 Yes Merdis Stalling, MD  amLODipine  (NORVASC ) 10 MG tablet Take 10 mg by mouth daily. 09/09/22   [provider]  atorvastatin  (LIPITOR) 20 MG tablet Take 1 tablet (20 mg total) by mouth daily. 10/23/16   Stephany Ehrich, MD  clindamycin  (CLEOCIN  T) 1 % external solution Apply 1 Application topically daily. Apply to back of neck    [provider]  colchicine  0.6 MG tablet TAKE 1 TABLET BY MOUTH 3 TIMES DAILY FOR 5 DAYS FOR GOUT PAIN. 12/01/23   Darrin Emerald, MD  Copper  Gluconate 2 MG TABS Take 2 tablets (4 mg total) by mouth  daily. 12/06/23   Sonnie Dusky, PA-C  cyanocobalamin  (VITAMIN B12) 1000 MCG tablet Take 1,000 mcg by mouth daily.    [provider]  ferrous sulfate 325 (65 FE) MG tablet Take 325 mg by mouth daily.    [provider]  folic acid  (FOLVITE ) 1 MG tablet Take 1 tablet (1 mg total) by mouth daily. 12/06/23   Sonnie Dusky, PA-C  HYDROcodone -acetaminophen  (NORCO) 7.5-325 MG tablet One tablet every six hours as needed for pain. 12/09/23   Pleasant Brilliant, MD  levothyroxine  (SYNTHROID ) 50 MCG tablet Take 50 mcg by mouth every morning. 09/09/22   [provider]  levothyroxine  (SYNTHROID , LEVOTHROID) 200 MCG tablet Take 1 tablet (200 mcg total) by mouth daily before breakfast. 09/24/18   Colin Dawley, MD  metoprolol  succinate (TOPROL -XL) 25 MG 24 hr tablet Take 1.5 tablets (37.5 mg total) by mouth daily. 02/19/20   Laurann Pollock, MD  pantoprazole  (PROTONIX ) 40 MG tablet Take 1 tablet (40 mg total) by mouth 2 (two) times daily. 11/29/23 11/28/24  Vinetta Greening, DO  potassium chloride  (MICRO-K ) 10 MEQ CR capsule Take 10 mEq by mouth daily. 09/16/23   [provider]  tamsulosin  (FLOMAX ) 0.4 MG CAPS capsule Take 1 capsule (0.4 mg total) by mouth daily. 11/19/23  McKenzie, Arden Beck, MD      Allergies    Aspirin    Review of Systems   Review of Systems A 10 point review of systems was performed and is negative unless otherwise reported in HPI.  Physical Exam Updated Vital Signs BP 118/78   Pulse 87   Temp 98.2 F (36.8 C) (Oral)   Resp 17   Ht 6\' 3"  (1.905 m)   Wt 85 kg   SpO2 100%   BMI 23.42 kg/m  Physical Exam General: Normal appearing elderly male, lying in bed.  HEENT: PERRLA, Sclera anicteric, MMM, trachea midline.  Cardiology: RRR, no murmurs/rubs/gallops.  Resp: Normal respiratory rate and effort. CTAB, no wheezes, rhonchi, crackles.  Abd: Soft, non-tender, non-distended. No rebound tenderness or guarding.  GU: Deferred. MSK:  2+ peripheral edema symmetric in BLEs. No erythema, induration, or TTP. No  signs of trauma. Extremities without deformity or TTP. No cyanosis or clubbing. BL ankles and knees with no TTP or erythema. FROM in BLLEs, ambulatory with cane. Compartments soft. NVI. Skin: warm, dry.  Neuro: A&Ox4, CNs II-XII grossly intact. MAEs. Sensation grossly intact.  Psych: Normal mood and affect.   ED Results / Procedures / Treatments   Labs (all labs ordered are listed, but only abnormal results are displayed) Labs Reviewed  CBC WITH DIFFERENTIAL/PLATELET - Abnormal; Notable for the following components:      Result Value   WBC 3.7 (*)    RBC 2.69 (*)    Hemoglobin 9.0 (*)    HCT 27.5 (*)    MCV 102.2 (*)    Platelets 93 (*)    All other components within normal limits  COMPREHENSIVE METABOLIC PANEL WITH GFR - Abnormal; Notable for the following components:   Creatinine, Ser 1.36 (*)    Calcium  8.6 (*)    Albumin 2.8 (*)    AST 58 (*)    GFR, Estimated 55 (*)    All other components within normal limits  URINALYSIS, ROUTINE W REFLEX MICROSCOPIC - Abnormal; Notable for the following components:   Color, Urine STRAW (*)    Leukocytes,Ua TRACE (*)    All other components within normal limits  BRAIN NATRIURETIC PEPTIDE - Abnormal; Notable for the following components:   B Natriuretic Peptide 126.0 (*)    All other components within normal limits  VITAMIN B12 - Abnormal; Notable for the following components:   Vitamin B-12 1,004 (*)    All other components within normal limits  IRON  AND TIBC - Abnormal; Notable for the following components:   TIBC 198 (*)    All other components within normal limits  FERRITIN - Abnormal; Notable for the following components:   Ferritin 362 (*)    All other components within normal limits  RETICULOCYTES - Abnormal; Notable for the following components:   RBC. 2.96 (*)    All other components within normal limits  FOLATE    EKG None  Radiology No  results found.  Procedures Procedures    Medications Ordered in ED Medications  potassium chloride  SA (KLOR-CON  M) CR tablet 40 mEq (40 mEq Oral Given 12/30/23 1513)  furosemide  (LASIX ) injection 20 mg (20 mg Intravenous Given 12/30/23 1513)    ED Course/ Medical Decision Making/ A&P                          Medical Decision Making Amount and/or Complexity of Data Reviewed Labs: ordered. Decision-making details documented in ED Course.  Risk Prescription drug management.    This patient presents to the ED for concern of BL LEE, this involves an extensive number of treatment options, and is a complaint that carries with it a high risk of complications and morbidity.  I considered the following differential and admission for this acute, potentially life threatening condition. Overall he is HDS and well-appearing.   MDM:    Presents with lower extremity edema. Low suspicion for DVT or cellulitis given that symptoms are symmetric and bilateral, without any tenderness palpation, erythema, induration.  No concern for compartment syndrome or trauma given no report of trauma.  Patient has mildly elevated BNP.  Does have a history of diastolic heart failure from his echo in 2018.  Does not take Lasix  anymore.  Patient is given 20 mg IV Lasix  here and will be discharged with 20 mg p.o. daily x 3 days.  Also supplemented his potassium here.  He has no significant AKI, proteinuria, or hematuria to suggest renal cause of his swelling.  No chest pain, shortness of breath, orthopnea, hypoxia, tachypnea that would indicate pulmonary edema.  Consider possible heart failure exacerbation or simple volume overload.  Discussed with patient and his family at bedside about these findings and instructed to follow-up with PCP or cardiologist within 1 week.   Clinical Course as of 12/30/23 2118  Thu Dec 30, 2023  1450 Thrombocytopenia at baseline [HN]  1451 Hemoglobin(!): 9.0 Worsening anemia, down from 10.3  from three weeks ago, has h/o IDA but this is macrocytic now, denies hematochezia/melena, will order anemia panel for f/u outpatient [HN]  1452 B Natriuretic Peptide(!): 126.0 Mildly elevated [HN]  1453 Lasix  naive, will give 20 mg IV and three days of 20 mg every day at home [HN]  1453 Potassium: 3.7 Will replete as well [HN]    Clinical Course User Index [HN] Merdis Stalling, MD    Labs: I Ordered, and personally interpreted labs.  The pertinent results include:  those listed above  Additional history obtained from chart review, family at bedside  Reevaluation: After the interventions noted above, I reevaluated the patient and found that they have :stayed the same  Social Determinants of Health: Lives independently  Disposition:  DC w/ discharge instructions/return precautions. All questions answered to patient's satisfaction.    Co morbidities that complicate the patient evaluation  Past Medical History:  Diagnosis Date   Anemia    FeDA: GASTRIC POLYPS, B12; TCS 2008 EGD 2009, 2008-HB 11.1 MCV 83.6 CR 1.22, 2009 FERRITIN 102-22   B12 deficiency    Carcinoid tumor determined by biopsy of stomach 09/25/2014   Chronic knee pain    Diverticulosis of colon    Lower GI bleed 2008   Essential hypertension    GERD (gastroesophageal reflux disease)    Gout    Hepatomegaly    Hepatic steatosis   History of alcohol abuse    History of septic arthritis    Hypothyroidism    PUD    Substance abuse (HCC)      Medicines Meds ordered this encounter  Medications   potassium chloride  SA (KLOR-CON  M) CR tablet 40 mEq   furosemide  (LASIX ) injection 20 mg   furosemide  (LASIX ) 20 MG tablet    Sig: Take 1 tablet (20 mg total) by mouth daily for 3 days.    Dispense:  3 tablet    Refill:  0    I have reviewed the patients home medicines and have made adjustments as needed  Problem  List / ED Course: Problem List Items Addressed This Visit   None Visit Diagnoses       Leg  swelling    -  Primary                   This note was created using dictation software, which may contain spelling or grammatical errors.    Merdis Stalling, MD 12/30/23 2122

## 2023-12-30 NOTE — Discharge Instructions (Signed)
 Thank you for coming to [facility] Emergency Department. You were seen for leg swelling. We did an exam, labs, and imaging, and these showed mild swelling in your lower extremities. Please take lasix  20 mg once per day starting tomorrow for 3 days. Please follow up with your primary care provider within 1 week.   Do not hesitate to return to the ED or call 911 if you experience: -Worsening symptoms -Chest pain, shortness of breath -Lightheadedness, passing out -Fevers/chills -Anything else that concerns you

## 2024-01-03 ENCOUNTER — Ambulatory Visit (HOSPITAL_COMMUNITY)
Admission: RE | Admit: 2024-01-03 | Discharge: 2024-01-03 | Disposition: A | Discharge status: Home / Self Care | Source: Ambulatory Visit

## 2024-01-03 DIAGNOSIS — R7989 Other specified abnormal findings of blood chemistry: Secondary | ICD-10-CM | POA: Insufficient documentation

## 2024-01-05 ENCOUNTER — Telehealth: Payer: Self-pay

## 2024-01-05 MED ORDER — HYDROCODONE-ACETAMINOPHEN 7.5-325 MG PO TABS
ORAL_TABLET | ORAL | 0 refills | Status: DC
Start: 2024-01-05 — End: 2024-02-03

## 2024-01-05 NOTE — Telephone Encounter (Signed)
 Hydrocodone-Acetaminophen 7.5/325 MG Qty 50 Tablets  PATIENT USES [facility]

## 2024-01-10 ENCOUNTER — Encounter: Payer: Self-pay | Admitting: Gastroenterology

## 2024-01-10 ENCOUNTER — Ambulatory Visit (INDEPENDENT_AMBULATORY_CARE_PROVIDER_SITE_OTHER): Admitting: Gastroenterology

## 2024-01-10 VITALS — BP 124/73 | HR 99 | Temp 97.7°F | Ht 75.0 in | Wt 191.6 lb

## 2024-01-10 DIAGNOSIS — R7989 Other specified abnormal findings of blood chemistry: Secondary | ICD-10-CM

## 2024-01-10 DIAGNOSIS — K259 Gastric ulcer, unspecified as acute or chronic, without hemorrhage or perforation: Secondary | ICD-10-CM | POA: Insufficient documentation

## 2024-01-10 DIAGNOSIS — D696 Thrombocytopenia, unspecified: Secondary | ICD-10-CM | POA: Diagnosis not present

## 2024-01-10 DIAGNOSIS — K760 Fatty (change of) liver, not elsewhere classified: Secondary | ICD-10-CM

## 2024-01-10 DIAGNOSIS — Z85020 Personal history of malignant carcinoid tumor of stomach: Secondary | ICD-10-CM

## 2024-01-10 DIAGNOSIS — D3A092 Benign carcinoid tumor of the stomach: Secondary | ICD-10-CM

## 2024-01-10 DIAGNOSIS — R7401 Elevation of levels of liver transaminase levels: Secondary | ICD-10-CM | POA: Diagnosis not present

## 2024-01-10 DIAGNOSIS — D649 Anemia, unspecified: Secondary | ICD-10-CM

## 2024-01-10 DIAGNOSIS — F1010 Alcohol abuse, uncomplicated: Secondary | ICD-10-CM

## 2024-01-10 NOTE — Patient Instructions (Signed)
 Please make sure you are taking both copper  gluconate and pantoprazole . These medications were not in your bag of medications today. Please complete labs at your convenience at [facility].  Return office visit in six months.

## 2024-01-10 NOTE — Progress Notes (Signed)
 GI Office Note    Referring Provider: Jace Martinet, FNP Primary Care Physician:  Jace Martinet, FNP  Primary Gastroenterologist: Rolando Cliche. Mordechai April, DO   Chief Complaint   Chief Complaint  Patient presents with   Follow-up    History of Present Illness   [name] is a 73 y.o. male presenting today for follow up of neuroendocrine carcinoid tumor of the stomach. Last seen 10/2023.   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 Based on pathology, there was concern for residual disease or more extensive diseaese within the stomach.  Octreotide  scan on 09/19/2014 was negative for any avid metastases. EGD with gastric biopsy on 03/12/2015 was negative for dysplasia or malignancy. EGD with polypectomy by Dr. Nolene Baumgarten on 10/08/2015: Gastric polyp with well-differentiated neuroendocrine tumor. His case was presented at GI tumor board in Feb/March 2016 with recommendation for anatomic biopsies to guide role of surgical resection.  Anatomic biopsies throughout stomach on 04/21/2016 were negative. Chromogranin A elevated since January 2020 (186.0).  Gradually trending upward with most recent chromogranin A elevated at 215.5. (Chromogranin A was within normal limits from June 2016 through January 2019). Following elevated chromogranin levels in January 2020, Dr. Nolene Baumgarten was aware of elevated levels and advised EGD after having seen patient in consult.   Patient refused at that time.  He does not currently have any signs or symptoms of carcinoid syndrome. Per Dr. Katragadda, chromogranin A can be falsely elevated in patients on PPI, however patient is not taking any PPI or other acid blocking medication. Per oncology, ideally, patient will agree to EGD.  Otherwise, could consider checking 24-hour urine 5 HIAA, or checking dotatate PET scan if significantly increased chromogranin A or symptomatic.      H/o chronic anemia, since 2020.  Thrombocytopenia since 2010. CT 04/2019 with no evidence of cirrhosis. Recent labs with Hgb 10.2, MCV 99.4. ferritin 478, iron  36, cre 1.17. Work up by hematology with recent labs.  Folate 4.2, copper  58, B12 1261, serum protein electrophoresis with elevated beta globulin, elevated M spike, IgA 1894, immunofixation with polyclonal increase detected in 1 or more immunoglobulins, elevated kappa free light chain and lambda free light chains.  Chronically has elevated AST less than 2 times normal.  Follow-up with Sheril Dines in April.   Today: H/o elevated LFTs, did not complete labs after last ov. Patient has previously declined colonoscopy but says he will not rule out in the future. Today, he continues to refuse colonoscopy. He really does not want future EGDs.   No abdominal pain. Appetite ok. No heartburn. No dysphagia. Appetite ok. BM every day. No melena, brbpr.   Slowed up on etoh use. States now he is drinking mostly beer. Usually mostly on the weekends. 2-3 per day. Heavier in the past. States it has been years since he was a heavy drinker but he really cannot provide details.    Labs from April 2025: Glucose 96, creatinine 1.25, potassium 3.8, sodium 138, albumin 3.3, total bilirubin 0.6, alkaline phosphatase 111, AST 86, ALT 60, A1c 4.8, total cholesterol 72, white blood cell count 5900, hemoglobin 10.7, MCV 98, platelets 135,000, TSH 0.962.  See below for other labs.  Per PA Pennington's note: DISCUSSION WITH MD (Dr. Cheree Cords) on 12/07/2023: Reviewed patient history and pathology from most recent EGD.  Per Dr. Cheree Cords, no further imaging or EGD (for oncology surveillance), unless significant lab changes, symptoms, or other indication.  Will continue to monitor labs  Ruq u/s 12/2023:  IMPRESSION: Mildly heterogeneous hepatic echogenicity with lobular liver contours. This is nonspecific but can be seen in the setting of underlying hepatocellular disease.  Abd u/s  10/2023: IMPRESSION: 1. No acute abnormality identified. 2. Increased echotexture of the liver. This is a nonspecific finding but can be seen in fatty infiltration of liver.  EGD 10/2023: -gastritis s/p bx -three gastric polyps resected -single gastric polyp resected -single gastric plyp resected -nonbleeding gastric ulcers -normal duodenal bulb, firtst portion of duodenum and second portion of duodenum -pathology with hyperplastic polyps and gastric bx showed significant inflammation and gastric intestinal metaplasia, no malignancy. Take PPI BID due to ulcers.   EGD 03/2016: -gastric carcinoid - nodular mucosa in cardia, in the gastric fundus, gastric body. Nodules persist but unable to appreciate additional lesions -metal clips remain in stomach from 10/2015 -biopsies showed gastritis, no h.pylori, no carcinoid   Colonoscopy 12/2017: -severe diverticulosis -tortuous left colon -internal hemorrhoids -repeat colonoscopy in five years, due to history of advanced colon polyps in 2016   Medications   Current Outpatient Medications  Medication Sig Dispense Refill   amLODipine  (NORVASC ) 10 MG tablet Take 10 mg by mouth daily.     atorvastatin  (LIPITOR) 10 MG tablet Take 10 mg by mouth daily.     Cholecalciferol (VITAMIN D -3 PO) Take 2,000 Units by mouth daily.     colchicine  0.6 MG tablet TAKE 1 TABLET BY MOUTH 3 TIMES DAILY FOR 5 DAYS FOR GOUT PAIN. 15 tablet 3   cyanocobalamin  (VITAMIN B12) 1000 MCG tablet Take 1,000 mcg by mouth daily.     folic acid  (FOLVITE ) 1 MG tablet Take 1 tablet (1 mg total) by mouth daily. 90 tablet 3   HYDROcodone -acetaminophen  (NORCO) 7.5-325 MG tablet One tablet every six hours as needed for pain. 50 tablet 0   levothyroxine  (SYNTHROID ) 50 MCG tablet Take 50 mcg by mouth every morning.     levothyroxine  (SYNTHROID , LEVOTHROID) 200 MCG tablet Take 1 tablet (200 mcg total) by mouth daily before breakfast. 90 tablet 3   metoprolol  succinate (TOPROL -XL) 25 MG  24 hr tablet Take 1.5 tablets (37.5 mg total) by mouth daily. 45 tablet 6   Misc Natural Products (GLUCOSAMINE-CHONDROITIN SULF PO) Take 1 capsule by mouth daily. 1500mg /1200mg      pantoprazole  (PROTONIX ) 40 MG tablet Take 1 tablet (40 mg total) by mouth 2 (two) times daily. 60 tablet 11   potassium chloride  (MICRO-K ) 10 MEQ CR capsule Take 10 mEq by mouth daily.     tamsulosin  (FLOMAX ) 0.4 MG CAPS capsule Take 1 capsule (0.4 mg total) by mouth daily. 90 capsule 3   Copper  Gluconate 2 MG TABS Take 2 tablets (4 mg total) by mouth daily. (Patient not taking: Reported on 01/10/2024) 180 tablet 3   No current facility-administered medications for this visit.    Allergies   Allergies as of 01/10/2024 - Review Complete 01/10/2024  Allergen Reaction Noted   Aspirin Other (See Comments) 06/01/2007      Review of Systems   General: Negative for anorexia, weight loss, fever, chills, fatigue, weakness. ENT: Negative for hoarseness, difficulty swallowing , nasal congestion. CV: Negative for chest pain, angina, palpitations, dyspnea on exertion, peripheral edema.  Respiratory: Negative for dyspnea at rest, dyspnea on exertion, cough, sputum, wheezing.  GI: See history of present illness. GU:  Negative for dysuria, hematuria, urinary incontinence, urinary frequency, nocturnal urination.  Endo: Negative for unusual weight change.     Physical Exam   BP 124/73 (BP Location:  Right Arm, Patient Position: Sitting, Cuff Size: Normal)   Pulse 99   Temp 97.7 F (36.5 C) (Oral)   Ht 6\' 3"  (1.905 m)   Wt 191 lb 9.6 oz (86.9 kg)   SpO2 100%   BMI 23.95 kg/m    General: Well-nourished, well-developed in no acute distress.  Eyes: No icterus. Mouth: Oropharyngeal mucosa moist and pink   Lungs: Clear to auscultation bilaterally.  Heart: Regular rate and rhythm, no murmurs rubs or gallops.  Abdomen: Bowel sounds are normal, nontender. Refused getting on exam table, exam limited. Rectal: not performed   Extremities: 1+ lower extremity edema. No clubbing or deformities. Neuro: Alert and oriented x 4   Skin: Warm and dry, no jaundice.   Psych: Alert and cooperative, normal mood and affect.  Labs   Lab Results  Component Value Date   IRON  49 12/30/2023   TIBC 198 (L) 12/30/2023   FERRITIN 362 (H) 12/30/2023  Iron  sat 25% 12/30/23 Lab Results  Component Value Date   FOLATE 25.8 12/30/2023   Lab Results  Component Value Date   VITAMINB12 1,004 (H) 12/30/2023   Lab Results  Component Value Date   ALT 38 12/30/2023   AST 58 (H) 12/30/2023   ALKPHOS 90 12/30/2023   BILITOT 0.9 12/30/2023   Lab Results  Component Value Date   WBC 3.7 (L) 12/30/2023   HGB 9.0 (L) 12/30/2023   HCT 27.5 (L) 12/30/2023   MCV 102.2 (H) 12/30/2023   PLT 93 (L) 12/30/2023   Lab Results  Component Value Date   IRON  49 12/30/2023   TIBC 198 (L) 12/30/2023   FERRITIN 362 (H) 12/30/2023   Lab Results  Component Value Date   ESRSEDRATE 100 (H) 10/18/2008   Lab Results  Component Value Date   CRP 8.2 (H) 10/02/2008    Imaging Studies   US  Abdomen Limited RUQ (LIVER/GB) Result Date: 01/03/2024 CLINICAL DATA:  elevated lfts EXAM: ULTRASOUND ABDOMEN LIMITED RIGHT UPPER QUADRANT COMPARISON:  November 18, 2023 FINDINGS: Gallbladder: Surgically absent Common bile duct: Diameter: Visualized portion measures 4 mm, within normal limits. Liver: No focal lesion identified. Mildly heterogeneous in parenchymal echogenicity. Lobular liver contours. Portal vein is patent on color Doppler imaging with normal direction of blood flow towards the liver. Other: None. IMPRESSION: Mildly heterogeneous hepatic echogenicity with lobular liver contours. This is nonspecific but can be seen in the setting of underlying hepatocellular disease. Electronically Signed   By: Clancy Crimes M.D.   On: 01/03/2024 09:11    Assessment   Neuroendocrine carcinoid tumor of the stomach: -original diagnosis in 2015, see extensive  details above -oncology requesting updated EGD, completed as outlined above.  -per Dr. Cheree Cords, no surveillance EGDs needed unless significant lab changes or symptoms.  Gastric ulcers: noted on EGD, March 2025 -pantoprazole  started after EGD, 40mg  BID, patient states he is taking and medication is at home. He will verify. -may need surveillance EGD next month to verify ulcer healing. To discuss with Dr. Mordechai April  Elevated AST: -chronically mildly elevated, query etoh related -complete u/s in March with normal spleen, fatty liver -ruq u/s by PCP in 12/2023 with lobular contours -recommend updated labs  -encouraged etoh cessation   Normocytic anemia/thrombocytopenia: -mild intermittent anemia since at least 2020 -mild to moderate intermittent thrombocytopenia since 2010 -last liver imaging by CT 2020, no overt cirrhosis, normal sized spleen -Abd u/s 10/2023 with normal spleen, fatty liver -RUQ u/s 12/2023 with lobular border -no IDA -low folate 4.1, improved now on  supplement -B12 1261 -low copper , patient unsure if on supplement, he will check today, not in his medication bag -trend downward of Hgb, denies rectal bleeding or melena. He is overdue for a colonoscopy but he declines -?advanced fibrosis or early cirrhosis, possibly contributing to his pancytopenia/anemia -also with abnormal SPEP with M spike but patient delaying work up       [employer]. Harles Lied, MHS, PA-C [facility]

## 2024-01-12 ENCOUNTER — Ambulatory Visit: Payer: 59 | Admitting: Orthopaedic Surgery

## 2024-01-12 ENCOUNTER — Other Ambulatory Visit (INDEPENDENT_AMBULATORY_CARE_PROVIDER_SITE_OTHER): Payer: Self-pay

## 2024-01-12 ENCOUNTER — Encounter: Payer: Self-pay | Admitting: Orthopaedic Surgery

## 2024-01-12 VITALS — BP 124/73 | Ht 75.0 in | Wt 193.0 lb

## 2024-01-12 DIAGNOSIS — M25562 Pain in left knee: Secondary | ICD-10-CM | POA: Diagnosis not present

## 2024-01-12 DIAGNOSIS — Z96652 Presence of left artificial knee joint: Secondary | ICD-10-CM

## 2024-01-12 DIAGNOSIS — G8929 Other chronic pain: Secondary | ICD-10-CM | POA: Diagnosis not present

## 2024-01-12 DIAGNOSIS — T8484XA Pain due to internal orthopedic prosthetic devices, implants and grafts, initial encounter: Secondary | ICD-10-CM

## 2024-01-12 DIAGNOSIS — M1A0411 Idiopathic chronic gout, right hand, with tophus (tophi): Secondary | ICD-10-CM

## 2024-01-12 NOTE — Progress Notes (Signed)
 My wrist hurts and my left knee hurts.  He has chronic gout.  He is taking Uloric  for this.  He has chronic pain and swelling of the left wrist.  He has developed pain in both knees at times.  He has bilateral total knees. He has no swelling, no redness.  He has no giving way.  He says they just hurt at times.  Both knees have no swelling, no increased warmth, no effusion, full ROM of both knees, NV intact, no distal edema.  Wrist has gouty tophus and some swelling.  ROM is good.  X-rays were done of the left total knee, reported separately.  I see no loosening.  Encounter Diagnoses  Name Primary?   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  Return in two weeks.  Continue the Uloric .  Call if any problem.  Precautions discussed.  Electronically Signed Pleasant Brilliant, MD 5/14/202510:54 AM

## 2024-01-15 LAB — CBC WITH DIFFERENTIAL/PLATELET
Basophils Absolute: 0 10*3/uL (ref 0.0–0.2)
Basos: 1 %
EOS (ABSOLUTE): 0.2 10*3/uL (ref 0.0–0.4)
Eos: 3 %
Hematocrit: 31.1 % — ABNORMAL LOW (ref 37.5–51.0)
Hemoglobin: 10.2 g/dL — ABNORMAL LOW (ref 13.0–17.7)
Immature Grans (Abs): 0 10*3/uL (ref 0.0–0.1)
Immature Granulocytes: 0 %
Lymphocytes Absolute: 2.1 10*3/uL (ref 0.7–3.1)
Lymphs: 39 %
MCH: 32.8 pg (ref 26.6–33.0)
MCHC: 32.8 g/dL (ref 31.5–35.7)
MCV: 100 fL — ABNORMAL HIGH (ref 79–97)
Monocytes Absolute: 0.5 10*3/uL (ref 0.1–0.9)
Monocytes: 10 %
Neutrophils Absolute: 2.5 10*3/uL (ref 1.4–7.0)
Neutrophils: 47 %
Platelets: 131 10*3/uL — ABNORMAL LOW (ref 150–450)
RBC: 3.11 x10E6/uL — ABNORMAL LOW (ref 4.14–5.80)
RDW: 13.3 % (ref 11.6–15.4)
WBC: 5.3 10*3/uL (ref 3.4–10.8)

## 2024-01-15 LAB — COMPREHENSIVE METABOLIC PANEL WITH GFR
ALT: 35 IU/L (ref 0–44)
AST: 57 IU/L — ABNORMAL HIGH (ref 0–40)
Albumin: 3.3 g/dL — ABNORMAL LOW (ref 3.8–4.8)
Alkaline Phosphatase: 127 IU/L — ABNORMAL HIGH (ref 44–121)
BUN/Creatinine Ratio: 9 — ABNORMAL LOW (ref 10–24)
BUN: 11 mg/dL (ref 8–27)
Bilirubin Total: 0.4 mg/dL (ref 0.0–1.2)
CO2: 21 mmol/L (ref 20–29)
Calcium: 9.2 mg/dL (ref 8.6–10.2)
Chloride: 105 mmol/L (ref 96–106)
Creatinine, Ser: 1.27 mg/dL (ref 0.76–1.27)
Globulin, Total: 3.6 g/dL (ref 1.5–4.5)
Glucose: 103 mg/dL — ABNORMAL HIGH (ref 70–99)
Potassium: 4.2 mmol/L (ref 3.5–5.2)
Sodium: 139 mmol/L (ref 134–144)
Total Protein: 6.9 g/dL (ref 6.0–8.5)
eGFR: 59 mL/min/{1.73_m2} — ABNORMAL LOW (ref >59)

## 2024-01-15 LAB — HEPATITIS B CORE ANTIBODY, TOTAL: Hep B Core Total Ab: NEGATIVE

## 2024-01-15 LAB — HEPATITIS B SURFACE ANTIBODY,QUALITATIVE: Hep B Surface Ab, Qual: NONREACTIVE

## 2024-01-15 LAB — PROTIME-INR
INR: 1.1 (ref 0.9–1.2)
Prothrombin Time: 12.6 s — ABNORMAL HIGH (ref 9.1–12.0)

## 2024-01-15 LAB — HEPATITIS B SURFACE ANTIGEN: Hepatitis B Surface Ag: NEGATIVE

## 2024-01-15 LAB — HEPATITIS C ANTIBODY: Hep C Virus Ab: NONREACTIVE

## 2024-01-15 LAB — HEPATITIS A ANTIBODY, TOTAL: hep A Total Ab: NEGATIVE

## 2024-01-17 ENCOUNTER — Other Ambulatory Visit: Payer: Self-pay | Admitting: Orthopedic Surgery

## 2024-01-17 ENCOUNTER — Ambulatory Visit: Payer: Self-pay | Admitting: Gastroenterology

## 2024-01-26 ENCOUNTER — Ambulatory Visit: Admitting: Orthopaedic Surgery

## 2024-01-26 ENCOUNTER — Encounter: Payer: Self-pay | Admitting: Orthopaedic Surgery

## 2024-01-26 DIAGNOSIS — T8484XD Pain due to internal orthopedic prosthetic devices, implants and grafts, subsequent encounter: Secondary | ICD-10-CM | POA: Diagnosis not present

## 2024-01-26 DIAGNOSIS — Z96652 Presence of left artificial knee joint: Secondary | ICD-10-CM | POA: Diagnosis not present

## 2024-01-26 NOTE — Progress Notes (Signed)
 I feel better.  He has no pain with the left total knee.  Labs looked good.  He has chronic changes that have not changed in his labs.  Gait is good and the left total knee does not hurt.  Encounter Diagnosis  Name Primary?   Pain due to total left knee replacement, subsequent encounter Yes   I will see in three months.  Call if any problem.  Precautions discussed.  Electronically Signed Pleasant Brilliant, MD 5/28/20259:41 AM

## 2024-01-26 NOTE — Patient Instructions (Addendum)
 Follow up in 3 months with Dr. Iline Mallory

## 2024-02-02 ENCOUNTER — Telehealth: Payer: Self-pay | Admitting: Orthopaedic Surgery

## 2024-02-02 NOTE — Telephone Encounter (Signed)
 Dr. Vicente Graham pt - per Ninette Basque the pt is requesting a refill for Hydrocodone  7.5-325 to be sent to [facility]

## 2024-02-03 MED ORDER — HYDROCODONE-ACETAMINOPHEN 7.5-325 MG PO TABS: ORAL_TABLET | ORAL | 0 refills | Status: DC

## 2024-02-23 ENCOUNTER — Other Ambulatory Visit: Payer: Self-pay

## 2024-02-23 ENCOUNTER — Other Ambulatory Visit

## 2024-02-23 DIAGNOSIS — C61 Malignant neoplasm of prostate: Secondary | ICD-10-CM

## 2024-02-24 LAB — PSA: Prostate Specific Ag, Serum: 0.7 ng/mL (ref 0.0–4.0)

## 2024-02-25 ENCOUNTER — Ambulatory Visit (INDEPENDENT_AMBULATORY_CARE_PROVIDER_SITE_OTHER): Admitting: Urology

## 2024-02-25 ENCOUNTER — Encounter: Payer: Self-pay | Admitting: Urology

## 2024-02-25 VITALS — BP 112/66 | HR 89

## 2024-02-25 DIAGNOSIS — N401 Enlarged prostate with lower urinary tract symptoms: Secondary | ICD-10-CM | POA: Diagnosis not present

## 2024-02-25 DIAGNOSIS — N138 Other obstructive and reflux uropathy: Secondary | ICD-10-CM

## 2024-02-25 DIAGNOSIS — C61 Malignant neoplasm of prostate: Secondary | ICD-10-CM

## 2024-02-25 DIAGNOSIS — R351 Nocturia: Secondary | ICD-10-CM

## 2024-02-25 LAB — MICROSCOPIC EXAMINATION

## 2024-02-25 LAB — URINALYSIS, ROUTINE W REFLEX MICROSCOPIC
Bilirubin, UA: NEGATIVE
Glucose, UA: NEGATIVE
Nitrite, UA: NEGATIVE
RBC, UA: NEGATIVE
Specific Gravity, UA: 1.03 (ref 1.005–1.030)
Urobilinogen, Ur: 1 mg/dL (ref 0.2–1.0)
pH, UA: 6 (ref 5.0–7.5)

## 2024-02-25 MED ORDER — TAMSULOSIN HCL 0.4 MG PO CAPS: 0.4000 mg | ORAL_CAPSULE | Freq: Every day | ORAL | 3 refills | Status: AC

## 2024-02-25 NOTE — Progress Notes (Signed)
 02/25/2024 11:28 AM   [name] 1950/12/12 [id number]  Referring provider: Vick Lurie, FNP [address] [place],  [place] [address]  Followup prostate cancer   HPI: [name] is a 73yo here for followup for prostate cancer and BPh with nocturia. PSa stable at 0.7 IPSS 12 QOl 2 on flomax  0.4mg  daily. Urine stream strong. No straining to urinate. Nocturia 1-2x depending on fluid consumption.   PMH: Past Medical History:  Diagnosis Date   Anemia    FeDA: GASTRIC POLYPS, B12; TCS 2008 EGD 2009, 2008-HB 11.1 MCV 83.6 CR 1.22, 2009 FERRITIN 102-22   B12 deficiency    Carcinoid tumor determined by biopsy of stomach 09/25/2014   Chronic knee pain    Diverticulosis of colon    Lower GI bleed 2008   Essential hypertension    GERD (gastroesophageal reflux disease)    Gout    Hepatomegaly    Hepatic steatosis   History of alcohol abuse    History of septic arthritis    Hypothyroidism    PUD    Substance abuse (HCC)     Surgical History: Past Surgical History:  Procedure Laterality Date   BIOPSY  08/14/2014   Procedure: BIOPSY;  Surgeon: Margo LITTIE Haddock, MD;  [facility];  Service: Endoscopy;;   BIOPSY  03/12/2015   Procedure: BIOPSY;  Surgeon: Margo LITTIE Haddock, MD;  [facility]: [facility];  Service: Endoscopy;;   BIOPSY  04/21/2016   Procedure: BIOPSY;  Surgeon: Margo LITTIE Haddock, MD;  [facility];  Service: Endoscopy;;  bx's of antrum, body of stomach, fundus, and cardia   CHOLECYSTECTOMY     COLONOSCOPY  2008 [facility] DJ   LGIB 2o to TICS, prep good   COLONOSCOPY WITH PROPOFOL  N/A 08/14/2014   SLF: 1. Four large colon polyps removed. 2. The left colon is redundant 3. Moderate diverticulosis throughout teh entire examined colon   COLONOSCOPY WITH PROPOFOL  N/A 11/06/2014   SLF: 8 small polyps removed. One large pedunculated polyp removed from the ascending colon, tubulovillous and tubular adenomas. Next colonoscopy March 2019   COLONOSCOPY WITH PROPOFOL  N/A  01/04/2018   Procedure: COLONOSCOPY WITH PROPOFOL ;  Surgeon: Haddock Margo LITTIE, MD;  [facility];  Service: Endoscopy;  Laterality: N/A;  10:00am   ESOPHAGEAL BANDING N/A 11/29/2023   Procedure: ESOPHAGOSCOPY, WITH ESOPHAGEAL VARICES BAND LIGATION;  Surgeon: Cindie Carlin POUR, DO;  [facility];  Service: Endoscopy;  Laterality: N/A;   ESOPHAGOGASTRODUODENOSCOPY  12/08/2007   DOQ:Qpcz gastric polyps seen in the cardia and body of the stomach/Normal esophagus without evidence of Barrett's, mass, erosion/Normal duodenal bulb and second portion of the duodenum. Benign bx.   ESOPHAGOGASTRODUODENOSCOPY N/A 11/29/2023   Procedure: EGD (ESOPHAGOGASTRODUODENOSCOPY);  Surgeon: Cindie Carlin POUR, DO;  [facility];  Service: Endoscopy;  Laterality: N/A;  2:00 pm, asa 3   ESOPHAGOGASTRODUODENOSCOPY (EGD) WITH PROPOFOL  N/A 08/14/2014   SLF: 1. Heme postive stools due to gastric and colon polyps 2. Multiple gastric    ESOPHAGOGASTRODUODENOSCOPY (EGD) WITH PROPOFOL  N/A 03/12/2015   SLF: Multiple gastric nodules seen in gasric body/antrum. 2. Non-erosive gastritis ( inflammation) was found in the gastric antrum.    ESOPHAGOGASTRODUODENOSCOPY (EGD) WITH PROPOFOL  N/A 10/08/2015   Procedure: ESOPHAGOGASTRODUODENOSCOPY (EGD) WITH PROPOFOL ;  Surgeon: Margo LITTIE Haddock, MD;  [facility];  Service: Endoscopy;  Laterality: N/A;  0945   ESOPHAGOGASTRODUODENOSCOPY (EGD) WITH PROPOFOL  N/A 04/21/2016   Procedure: ESOPHAGOGASTRODUODENOSCOPY (EGD) WITH PROPOFOL ;  Surgeon: Margo  LITTIE Haddock, MD;  [facility];  Service: Endoscopy;  Laterality: N/A;  730    GOLD SEED IMPLANT N/A 06/10/2023   Procedure: GOLD SEED IMPLANT;  Surgeon: Sherrilee Belvie LITTIE, MD;  Location: [facility];  Service: Urology;  Laterality: N/A;   HAND SURGERY Right    fracture repair with plates   HEMOSTASIS CLIP PLACEMENT  11/29/2023   Procedure: CONTROL OF HEMORRHAGE, GI TRACT, ENDOSCOPIC, BY CLIPPING OR OVERSEWING;   Surgeon: Cindie Carlin POUR, DO;  [facility];  Service: Endoscopy;;   INGUINAL HERNIA REPAIR Left 11/09/2018   Procedure: HERNIA REPAIR INGUINAL ADULT WITH MESH;  Surgeon: Kallie Manuelita BROCKS, MD;  Location: [facility];  Service: General;  Laterality: Left;   JOINT REPLACEMENT Bilateral    knees   POLYPECTOMY  08/14/2014   Procedure: POLYPECTOMY;  Surgeon: Margo LITTIE Haddock, MD;  [facility]: [facility];  Service: Endoscopy;;   POLYPECTOMY N/A 11/06/2014   Procedure: POLYPECTOMY;  Surgeon: Margo LITTIE Haddock, MD;  [facility]: [facility];  Service: Endoscopy;  Laterality: N/A;  Transverse Colon x3, Ascending Colon x2, Descending Colon x3   POLYPECTOMY  11/29/2023   Procedure: POLYPECTOMY;  Surgeon: Cindie Carlin POUR, DO;  [facility];  Service: Endoscopy;;   REPLACEMENT TOTAL KNEE BILATERAL     SPACE OAR INSTILLATION N/A 06/10/2023   Procedure: SPACE OAR INSTILLATION;  Surgeon: Sherrilee Belvie LITTIE, MD;  Location: [facility];  Service: Urology;  Laterality: N/A;   UPPER GASTROINTESTINAL ENDOSCOPY  APR 2009   INFLAMED HYPERPLASTIC POLYPS, CHRONIC GASTRITIS    Home Medications:  Allergies as of 02/25/2024       Reactions   Aspirin Other (See Comments)   Diverticular Bleed        Medication List        Accurate as of February 25, 2024 11:28 AM. If you have any questions, ask your nurse or doctor.          amLODipine  10 MG tablet Commonly known as: NORVASC  Take 10 mg by mouth daily.   atorvastatin  10 MG tablet Commonly known as: LIPITOR Take 10 mg by mouth daily.   colchicine  0.6 MG tablet TAKE 1 TABLET BY MOUTH 3 TIMES DAILY FOR 5 DAYS FOR GOUT PAIN.   Copper  Gluconate 2 MG Tabs Take 2 tablets (4 mg total) by mouth daily.   cyanocobalamin  1000 MCG tablet Commonly known as: VITAMIN B12 Take 1,000 mcg by mouth daily.   folic acid  1 MG tablet Commonly known as: FOLVITE  Take 1 tablet (1 mg total) by mouth daily.   GLUCOSAMINE-CHONDROITIN SULF PO Take 1 capsule by mouth daily.  1500mg /1200mg    HYDROcodone -acetaminophen  7.5-325 MG tablet Commonly known as: Norco One tablet every six hours as needed for pain.   levothyroxine  200 MCG tablet Commonly known as: SYNTHROID  Take 1 tablet (200 mcg total) by mouth daily before breakfast.   levothyroxine  50 MCG tablet Commonly known as: SYNTHROID  Take 50 mcg by mouth every morning.   metoprolol  succinate 25 MG 24 hr tablet Commonly known as: TOPROL -XL Take 1.5 tablets (37.5 mg total) by mouth daily.   pantoprazole  40 MG tablet Commonly known as: Protonix  Take 1 tablet (40 mg total) by mouth 2 (two) times daily.   potassium chloride  10 MEQ CR capsule Commonly known as: MICRO-K  Take 10 mEq by mouth daily.   tamsulosin  0.4 MG Caps capsule Commonly known as: Flomax  Take 1 capsule (0.4 mg total) by mouth daily.   VITAMIN D -3 PO Take 2,000 Units by  mouth daily.        Allergies:  Allergies  Allergen Reactions   Aspirin Other (See Comments)    Diverticular Bleed    Family History: Family History  Problem Relation Age of Onset   Diabetes Mother    Renal Disease Mother        failure   Diabetes Father        'old age'   Hypertension Sister    Hypertension Brother    Diabetes Brother    Diabetes Sister    Diabetes Sister    Diabetes Brother    Hypertension Brother    Diabetes Brother        right BKA   Kidney failure Brother        on dialysis   Kidney failure Brother        on dialysis   Diabetes Brother        bilateral BKA   Colon polyps Neg Hx    Colon cancer Neg Hx    Pancreatic disease Neg Hx     Social History:  reports that he has never smoked. He has never been exposed to tobacco smoke. He has never used smokeless tobacco. He reports current alcohol use of about 2.0 standard drinks of alcohol per week. He reports that he does not use drugs.  ROS: All other review of systems were reviewed and are negative except what is noted above in HPI  Physical Exam: BP 112/66   Pulse 89    Constitutional:  Alert and oriented, No acute distress. HEENT: [place] AT, moist mucus membranes.  Trachea midline, no masses. Cardiovascular: No clubbing, cyanosis, or edema. Respiratory: Normal respiratory effort, no increased work of breathing. GI: Abdomen is soft, nontender, nondistended, no abdominal masses GU: No CVA tenderness.  Lymph: No cervical or inguinal lymphadenopathy. Skin: No rashes, bruises or suspicious lesions. Neurologic: Grossly intact, no focal deficits, moving all 4 extremities. Psychiatric: Normal mood and affect.  Laboratory Data: Lab Results  Component Value Date   WBC 5.3 01/14/2024   HGB 10.2 (L) 01/14/2024   HCT 31.1 (L) 01/14/2024   MCV 100 (H) 01/14/2024   PLT 131 (L) 01/14/2024    Lab Results  Component Value Date   CREATININE 1.27 01/14/2024    Lab Results  Component Value Date   PSA 2.23 11/30/2008   PSA 2.90 06/02/2007    No results found for: TESTOSTERONE  Lab Results  Component Value Date   HGBA1C 5.6 10/16/2016    Urinalysis    Component Value Date/Time   COLORURINE STRAW (A) 12/30/2023 1502   APPEARANCEUR CLEAR 12/30/2023 1502   APPEARANCEUR Clear 10/23/2022 0959   LABSPEC 1.006 12/30/2023 1502   PHURINE 5.0 12/30/2023 1502   GLUCOSEU NEGATIVE 12/30/2023 1502   HGBUR NEGATIVE 12/30/2023 1502   HGBUR negative 05/15/2008 0830   BILIRUBINUR NEGATIVE 12/30/2023 1502   BILIRUBINUR Negative 10/23/2022 0959   KETONESUR NEGATIVE 12/30/2023 1502   PROTEINUR NEGATIVE 12/30/2023 1502   UROBILINOGEN 0.2 09/11/2014 1454   NITRITE NEGATIVE 12/30/2023 1502   LEUKOCYTESUR TRACE (A) 12/30/2023 1502    Lab Results  Component Value Date   LABMICR See below: 10/23/2022   WBCUA 0-5 10/23/2022   LABEPIT 0-10 10/23/2022   BACTERIA NONE SEEN 12/30/2023    Pertinent Imaging:  No results found for this or any previous visit.  No results found for this or any previous visit.  No results found for this or any previous visit.  No  results found for  this or any previous visit.  Results for orders placed during the hospital encounter of 10/17/08  US  Renal  Narrative Clinical Data: A R S/anemia.  Renal failure  RENAL/URINARY TRACT ULTRASOUND  Technique:  Complete ultrasound examination of the urinary tract was performed including evaluation of the kidneys, renal collecting systems, and urinary bladder.  Comparison: 08/29/2008 ultrasound  Findings: The right and left kidneys measure 13.2 and 12.0 cm in length, respectively.  Bilateral increased echogenicity of the renal cortex.  No renal mass, focal scarring, or hydronephrosis. Bladder unremarkable.  Prostate gland prominent.  IMPRESSION: Findings compatible with nonspecific renal medical disease.  No hydronephrosis.  Provider: Geni Hock  No results found for this or any previous visit.  No results found for this or any previous visit.  No results found for this or any previous visit.   Assessment & Plan:    1. Prostate cancer (HCC) (Primary) Followup 6 months with a PSA - Urinalysis, Routine w reflex microscopic  2. Benign prostatic hyperplasia with urinary obstruction Continue flomax  0.4mg  daily  3. Nocturia Continue flomax  0.4mg  daily   No follow-ups on file.  Belvie Clara, MD  [facility] [place]

## 2024-02-25 NOTE — Patient Instructions (Signed)

## 2024-02-28 ENCOUNTER — Other Ambulatory Visit: Payer: Self-pay | Admitting: Orthopedic Surgery

## 2024-03-01 ENCOUNTER — Telehealth: Payer: Self-pay

## 2024-03-01 MED ORDER — HYDROCODONE-ACETAMINOPHEN 7.5-325 MG PO TABS: ORAL_TABLET | ORAL | 0 refills | Status: DC

## 2024-03-01 NOTE — Telephone Encounter (Signed)
 Hydrocodone-Acetaminophen 7.5/325 MG Qty 50 Tablets  PATIENT USES [facility]

## 2024-03-08 ENCOUNTER — Inpatient Hospital Stay: Attending: Hematology

## 2024-03-15 ENCOUNTER — Inpatient Hospital Stay: Admitting: Physician Assistant

## 2024-03-20 ENCOUNTER — Other Ambulatory Visit

## 2024-03-29 ENCOUNTER — Telehealth: Payer: Self-pay | Admitting: Orthopaedic Surgery

## 2024-03-29 MED ORDER — HYDROCODONE-ACETAMINOPHEN 7.5-325 MG PO TABS
ORAL_TABLET | ORAL | 0 refills | Status: DC
Start: 2024-03-29 — End: 2024-04-26

## 2024-03-29 NOTE — Telephone Encounter (Signed)
 Dr. Sanjuan Dame pt - pt presented to the office requesting a refill for Hydrocodone 7.5-325 to be sent to [facility]

## 2024-04-03 ENCOUNTER — Inpatient Hospital Stay

## 2024-04-03 ENCOUNTER — Inpatient Hospital Stay: Attending: Physician Assistant

## 2024-04-03 DIAGNOSIS — E538 Deficiency of other specified B group vitamins: Secondary | ICD-10-CM

## 2024-04-03 DIAGNOSIS — E610 Copper deficiency: Secondary | ICD-10-CM

## 2024-04-03 DIAGNOSIS — C7A092 Malignant carcinoid tumor of the stomach: Secondary | ICD-10-CM | POA: Insufficient documentation

## 2024-04-03 DIAGNOSIS — D696 Thrombocytopenia, unspecified: Secondary | ICD-10-CM

## 2024-04-03 DIAGNOSIS — D649 Anemia, unspecified: Secondary | ICD-10-CM | POA: Insufficient documentation

## 2024-04-03 LAB — CBC WITH DIFFERENTIAL/PLATELET
Abs Immature Granulocytes: 0 K/uL (ref 0.00–0.07)
Basophils Absolute: 0 K/uL (ref 0.0–0.1)
Basophils Relative: 1 %
Eosinophils Absolute: 0.2 K/uL (ref 0.0–0.5)
Eosinophils Relative: 5 %
HCT: 34.4 % — ABNORMAL LOW (ref 39.0–52.0)
Hemoglobin: 11 g/dL — ABNORMAL LOW (ref 13.0–17.0)
Immature Granulocytes: 0 %
Lymphocytes Relative: 40 %
Lymphs Abs: 1.7 K/uL (ref 0.7–4.0)
MCH: 31.6 pg (ref 26.0–34.0)
MCHC: 32 g/dL (ref 30.0–36.0)
MCV: 98.9 fL (ref 80.0–100.0)
Monocytes Absolute: 0.5 K/uL (ref 0.1–1.0)
Monocytes Relative: 11 %
Neutro Abs: 1.9 K/uL (ref 1.7–7.7)
Neutrophils Relative %: 43 %
Platelets: 115 K/uL — ABNORMAL LOW (ref 150–400)
RBC: 3.48 MIL/uL — ABNORMAL LOW (ref 4.22–5.81)
RDW: 13.9 % (ref 11.5–15.5)
WBC: 4.2 K/uL (ref 4.0–10.5)
nRBC: 0 % (ref 0.0–0.2)

## 2024-04-03 LAB — IRON AND TIBC
Iron: 70 ug/dL (ref 45–182)
Saturation Ratios: 36 % (ref 17.9–39.5)
TIBC: 197 ug/dL — ABNORMAL LOW (ref 250–450)
UIBC: 127 ug/dL

## 2024-04-03 LAB — FOLATE: Folate: 18.8 ng/mL (ref >5.9)

## 2024-04-03 LAB — FERRITIN: Ferritin: 452 ng/mL — ABNORMAL HIGH (ref 24–336)

## 2024-04-03 LAB — SEDIMENTATION RATE: Sed Rate: 56 mm/h — ABNORMAL HIGH (ref 0–20)

## 2024-04-03 LAB — C-REACTIVE PROTEIN: CRP: 0.7 mg/dL (ref <1.0)

## 2024-04-04 LAB — RHEUMATOID FACTOR: Rheumatoid fact SerPl-aCnc: 15.4 [IU]/mL — ABNORMAL HIGH (ref <14.0)

## 2024-04-04 LAB — ANA: Anti Nuclear Antibody (ANA): NEGATIVE

## 2024-04-05 LAB — COPPER, SERUM: Copper: 75 ug/dL (ref 69–132)

## 2024-04-14 ENCOUNTER — Ambulatory Visit: Admitting: Cardiology

## 2024-04-14 ENCOUNTER — Ambulatory Visit: Attending: Cardiology | Admitting: Cardiology

## 2024-04-14 ENCOUNTER — Encounter: Payer: Self-pay | Admitting: Cardiology

## 2024-04-14 VITALS — BP 115/54 | HR 71 | Ht 75.0 in | Wt 188.0 lb

## 2024-04-14 DIAGNOSIS — I10 Essential (primary) hypertension: Secondary | ICD-10-CM | POA: Diagnosis not present

## 2024-04-14 DIAGNOSIS — I471 Supraventricular tachycardia, unspecified: Secondary | ICD-10-CM | POA: Diagnosis not present

## 2024-04-14 DIAGNOSIS — E782 Mixed hyperlipidemia: Secondary | ICD-10-CM | POA: Diagnosis not present

## 2024-04-14 NOTE — Progress Notes (Deleted)
 Clinical Summary [name] is a 73 y.o.male   Past Medical History:  Diagnosis Date   Anemia    FeDA: GASTRIC POLYPS, B12; TCS 2008 EGD 2009, 2008-HB 11.1 MCV 83.6 CR 1.22, 2009 FERRITIN 102-22   B12 deficiency    Carcinoid tumor determined by biopsy of stomach 09/25/2014   Chronic knee pain    Diverticulosis of colon    Lower GI bleed 2008   Essential hypertension    GERD (gastroesophageal reflux disease)    Gout    Hepatomegaly    Hepatic steatosis   History of alcohol abuse    History of septic arthritis    Hypothyroidism    PUD    Substance abuse (HCC)      Allergies  Allergen Reactions   Aspirin Other (See Comments)    Diverticular Bleed     Current Outpatient Medications  Medication Sig Dispense Refill   amLODipine  (NORVASC ) 10 MG tablet Take 10 mg by mouth daily.     atorvastatin  (LIPITOR) 10 MG tablet Take 10 mg by mouth daily.     Cholecalciferol (VITAMIN D -3 PO) Take 2,000 Units by mouth daily.     colchicine  0.6 MG tablet TAKE 1 TABLET BY MOUTH 3 TIMES DAILY FOR 5 DAYS FOR GOUT PAIN. 15 tablet 3   Copper  Gluconate 2 MG TABS Take 2 tablets (4 mg total) by mouth daily. 180 tablet 3   cyanocobalamin  (VITAMIN B12) 1000 MCG tablet Take 1,000 mcg by mouth daily.     folic acid  (FOLVITE ) 1 MG tablet Take 1 tablet (1 mg total) by mouth daily. 90 tablet 3   HYDROcodone -acetaminophen  (NORCO) 7.5-325 MG tablet One tablet every six hours as needed for pain. 50 tablet 0   levothyroxine  (SYNTHROID ) 50 MCG tablet Take 50 mcg by mouth every morning.     levothyroxine  (SYNTHROID , LEVOTHROID) 200 MCG tablet Take 1 tablet (200 mcg total) by mouth daily before breakfast. 90 tablet 3   metoprolol  succinate (TOPROL -XL) 25 MG 24 hr tablet Take 1.5 tablets (37.5 mg total) by mouth daily. 45 tablet 6   Misc Natural Products (GLUCOSAMINE-CHONDROITIN SULF PO) Take 1 capsule by mouth daily. 1500mg /1200mg      pantoprazole  (PROTONIX ) 40 MG tablet Take 1 tablet (40 mg total) by  mouth 2 (two) times daily. 60 tablet 11   potassium chloride  (MICRO-K ) 10 MEQ CR capsule Take 10 mEq by mouth daily.     tamsulosin  (FLOMAX ) 0.4 MG CAPS capsule Take 1 capsule (0.4 mg total) by mouth daily. 90 capsule 3   No current facility-administered medications for this visit.     Past Surgical History:  Procedure Laterality Date   BIOPSY  08/14/2014   Procedure: BIOPSY;  Surgeon: Margo LITTIE Haddock, MD;  [facility]: [facility];  Service: Endoscopy;;   BIOPSY  03/12/2015   Procedure: BIOPSY;  Surgeon: Margo LITTIE Haddock, MD;  Location: [facility];  Service: Endoscopy;;   BIOPSY  04/21/2016   Procedure: BIOPSY;  Surgeon: Margo LITTIE Haddock, MD;  [facility];  Service: Endoscopy;;  bx's of antrum, body of stomach, fundus, and cardia   CHOLECYSTECTOMY     COLONOSCOPY  2008 [facility] DJ   LGIB 2o to TICS, prep good   COLONOSCOPY WITH PROPOFOL  N/A 08/14/2014   SLF: 1. Four large colon polyps removed. 2. The left colon is redundant 3. Moderate diverticulosis throughout teh entire examined colon   COLONOSCOPY WITH PROPOFOL  N/A 11/06/2014   SLF: 8 small polyps removed. One large  pedunculated polyp removed from the ascending colon, tubulovillous and tubular adenomas. Next colonoscopy March 2019   COLONOSCOPY WITH PROPOFOL  N/A 01/04/2018   Procedure: COLONOSCOPY WITH PROPOFOL ;  Surgeon: Harvey Margo CROME, MD;  [facility];  Service: Endoscopy;  Laterality: N/A;  10:00am   ESOPHAGEAL BANDING N/A 11/29/2023   Procedure: ESOPHAGOSCOPY, WITH ESOPHAGEAL VARICES BAND LIGATION;  Surgeon: Cindie Carlin POUR, DO;  [facility];  Service: Endoscopy;  Laterality: N/A;   ESOPHAGOGASTRODUODENOSCOPY  12/08/2007   DOQ:Qpcz gastric polyps seen in the cardia and body of the stomach/Normal esophagus without evidence of Barrett's, mass, erosion/Normal duodenal bulb and second portion of the duodenum. Benign bx.   ESOPHAGOGASTRODUODENOSCOPY N/A 11/29/2023   Procedure: EGD (ESOPHAGOGASTRODUODENOSCOPY);  Surgeon:  Cindie Carlin POUR, DO;  [facility];  Service: Endoscopy;  Laterality: N/A;  2:00 pm, asa 3   ESOPHAGOGASTRODUODENOSCOPY (EGD) WITH PROPOFOL  N/A 08/14/2014   SLF: 1. Heme postive stools due to gastric and colon polyps 2. Multiple gastric    ESOPHAGOGASTRODUODENOSCOPY (EGD) WITH PROPOFOL  N/A 03/12/2015   SLF: Multiple gastric nodules seen in gasric body/antrum. 2. Non-erosive gastritis ( inflammation) was found in the gastric antrum.    ESOPHAGOGASTRODUODENOSCOPY (EGD) WITH PROPOFOL  N/A 10/08/2015   Procedure: ESOPHAGOGASTRODUODENOSCOPY (EGD) WITH PROPOFOL ;  Surgeon: Margo CROME Harvey, MD;  [facility];  Service: Endoscopy;  Laterality: N/A;  0945   ESOPHAGOGASTRODUODENOSCOPY (EGD) WITH PROPOFOL  N/A 04/21/2016   Procedure: ESOPHAGOGASTRODUODENOSCOPY (EGD) WITH PROPOFOL ;  Surgeon: Margo CROME Harvey, MD;  [facility];  Service: Endoscopy;  Laterality: N/A;  730    GOLD SEED IMPLANT N/A 06/10/2023   Procedure: GOLD SEED IMPLANT;  Surgeon: Sherrilee Belvie CROME, MD;  Location: [facility];  Service: Urology;  Laterality: N/A;   HAND SURGERY Right    fracture repair with plates   HEMOSTASIS CLIP PLACEMENT  11/29/2023   Procedure: CONTROL OF HEMORRHAGE, GI TRACT, ENDOSCOPIC, BY CLIPPING OR OVERSEWING;  Surgeon: Cindie Carlin POUR, DO;  [facility];  Service: Endoscopy;;   INGUINAL HERNIA REPAIR Left 11/09/2018   Procedure: HERNIA REPAIR INGUINAL ADULT WITH MESH;  Surgeon: Kallie Manuelita BROCKS, MD;  Location: [facility];  Service: General;  Laterality: Left;   JOINT REPLACEMENT Bilateral    knees   POLYPECTOMY  08/14/2014   Procedure: POLYPECTOMY;  Surgeon: Margo CROME Harvey, MD;  [facility]: [facility];  Service: Endoscopy;;   POLYPECTOMY N/A 11/06/2014   Procedure: POLYPECTOMY;  Surgeon: Margo CROME Harvey, MD;  [facility]: [facility];  Service: Endoscopy;  Laterality: N/A;  Transverse Colon x3, Ascending Colon x2, Descending Colon x3   POLYPECTOMY  11/29/2023   Procedure: POLYPECTOMY;  Surgeon:  Cindie Carlin POUR, DO;  [facility];  Service: Endoscopy;;   REPLACEMENT TOTAL KNEE BILATERAL     SPACE OAR INSTILLATION N/A 06/10/2023   Procedure: SPACE OAR INSTILLATION;  Surgeon: Sherrilee Belvie CROME, MD;  [facility]: [facility];  Service: Urology;  Laterality: N/A;   UPPER GASTROINTESTINAL ENDOSCOPY  APR 2009   INFLAMED HYPERPLASTIC POLYPS, CHRONIC GASTRITIS     Allergies  Allergen Reactions   Aspirin Other (See Comments)    Diverticular Bleed      Family History  Problem Relation Age of Onset   Diabetes Mother    Renal Disease Mother        failure   Diabetes Father        'old age'   Hypertension Sister    Hypertension Brother    Diabetes Brother  Diabetes Sister    Diabetes Sister    Diabetes Brother    Hypertension Brother    Diabetes Brother        right BKA   Kidney failure Brother        on dialysis   Kidney failure Brother        on dialysis   Diabetes Brother        bilateral BKA   Colon polyps Neg Hx    Colon cancer Neg Hx    Pancreatic disease Neg Hx      Social History [name] reports that he has never smoked. He has never been exposed to tobacco smoke. He has never used smokeless tobacco. [name] reports current alcohol use of about 2.0 standard drinks of alcohol per week.   Review of Systems CONSTITUTIONAL: No weight loss, fever, chills, weakness or fatigue.  HEENT: Eyes: No visual loss, blurred vision, double vision or yellow sclerae.No hearing loss, sneezing, congestion, runny nose or sore throat.  SKIN: No rash or itching.  CARDIOVASCULAR:  RESPIRATORY: No shortness of breath, cough or sputum.  GASTROINTESTINAL: No anorexia, nausea, vomiting or diarrhea. No abdominal pain or blood.  GENITOURINARY: No burning on urination, no polyuria NEUROLOGICAL: No headache, dizziness, syncope, paralysis, ataxia, numbness or tingling in the extremities. No change in bowel or bladder control.  MUSCULOSKELETAL: No muscle, back pain, joint  pain or stiffness.  LYMPHATICS: No enlarged nodes. No history of splenectomy.  PSYCHIATRIC: No history of depression or anxiety.  ENDOCRINOLOGIC: No reports of sweating, cold or heat intolerance. No polyuria or polydipsia.  SABRA   Physical Examination There were no vitals filed for this visit. There were no vitals filed for this visit.  Gen: resting comfortably, no acute distress HEENT: no scleral icterus, pupils equal round and reactive, no palptable cervical adenopathy,  CV Resp: Clear to auscultation bilaterally GI: abdomen is soft, non-tender, non-distended, normal bowel sounds, no hepatosplenomegaly MSK: extremities are warm, no edema.  Skin: warm, no rash Neuro:  no focal deficits Psych: appropriate affect   Diagnostic Studies     Assessment and Plan        Dorn PHEBE Ross, M.D., F.A.C.C.

## 2024-04-14 NOTE — Patient Instructions (Signed)
Medication Instructions:  Your physician recommends that you continue on your current medications as directed. Please refer to the Current Medication list given to you today.   Labwork: None today  Testing/Procedures: None today  Follow-Up: 1 year with Dr.McDowell  Any Other Special Instructions Will Be Listed Below (If Applicable).  If you need a refill on your cardiac medications before your next appointment, please call your pharmacy.

## 2024-04-14 NOTE — Progress Notes (Signed)
 Cardiology Office Note  Date: 04/14/2024   ID: [name], DOB 12-06-1950, MRN [id number]  PCP: Vick Lurie, FNP  Chief Complaint:  Chief Complaint  Patient presents with   Cardiac follow-up   History of Present Illness: [name] is a 73 y.o. male referred back to the office for cardiology evaluation by PCP.  He has a history of PSVT and was last seen in the office back in 2021.  He reports an intermittent sense of brief palpitations, typically no more than every few weeks.  These are not associated with chest pain or dizziness, no syncope.  Records indicate ER visit in May with knee pain and lower extremity edema.  He has also had interval follow-up with gastroenterology in May for history of neuroendocrine carcinoid tumor involving the stomach.  He states that his lower legs swell from time to time, no longer on standing diuretic, does not indicate any pain or tightness in his legs when this happens.  He is also limited by arthritic pain in his knees, uses a cane to ambulate.  We went over his medications.  He continues on Toprol -XL, states that he takes 12.5 mg in the morning and 25 mg in the evening.  He is also on Norvasc  for blood pressure control and Lipitor for hyperlipidemia.  I reviewed his ECG today which shows normal sinus rhythm with decreased R wave progression.  Review of Systems: As outlined in the history of present illness.  He does not report any orthopnea or PND.  Past Medical History: Past Medical History:  Diagnosis Date   Anemia    FeDA: GASTRIC POLYPS, B12; TCS 2008 EGD 2009, 2008-HB 11.1 MCV 83.6 CR 1.22, 2009 FERRITIN 102-22   B12 deficiency    Carcinoid tumor determined by biopsy of stomach 09/25/2014   Chronic knee pain    Diverticulosis of colon    Lower GI bleed 2008   Essential hypertension    GERD (gastroesophageal reflux disease)    Gout    Hepatomegaly    Hepatic steatosis   History of alcohol abuse    History of septic arthritis     Hypothyroidism    PSVT (paroxysmal supraventricular tachycardia) (HCC)    PUD    Substance abuse (HCC)    Past Surgical History: Past Surgical History:  Procedure Laterality Date   BIOPSY  08/14/2014   Procedure: BIOPSY;  Surgeon: Margo LITTIE Haddock, MD;  Location: [facility];  Service: Endoscopy;;   BIOPSY  03/12/2015   Procedure: BIOPSY;  Surgeon: Margo LITTIE Haddock, MD;  Location: [facility];  Service: Endoscopy;;   BIOPSY  04/21/2016   Procedure: BIOPSY;  Surgeon: Margo LITTIE Haddock, MD;  [facility];  Service: Endoscopy;;  bx's of antrum, body of stomach, fundus, and cardia   CHOLECYSTECTOMY     COLONOSCOPY  2008 [facility] DJ   LGIB 2o to TICS, prep good   COLONOSCOPY WITH PROPOFOL  N/A 08/14/2014   SLF: 1. Four large colon polyps removed. 2. The left colon is redundant 3. Moderate diverticulosis throughout teh entire examined colon   COLONOSCOPY WITH PROPOFOL  N/A 11/06/2014   SLF: 8 small polyps removed. One large pedunculated polyp removed from the ascending colon, tubulovillous and tubular adenomas. Next colonoscopy March 2019   COLONOSCOPY WITH PROPOFOL  N/A 01/04/2018   Procedure: COLONOSCOPY WITH PROPOFOL ;  Surgeon: Haddock Margo LITTIE, MD;  [facility];  Service: Endoscopy;  Laterality: N/A;  10:00am   ESOPHAGEAL BANDING N/A 11/29/2023   Procedure: ESOPHAGOSCOPY, WITH  ESOPHAGEAL VARICES BAND LIGATION;  Surgeon: Cindie Carlin POUR, DO;  [facility]: [facility];  Service: Endoscopy;  Laterality: N/A;   ESOPHAGOGASTRODUODENOSCOPY  12/08/2007   DOQ:Qpcz gastric polyps seen in the cardia and body of the stomach/Normal esophagus without evidence of Barrett's, mass, erosion/Normal duodenal bulb and second portion of the duodenum. Benign bx.   ESOPHAGOGASTRODUODENOSCOPY N/A 11/29/2023   Procedure: EGD (ESOPHAGOGASTRODUODENOSCOPY);  Surgeon: Cindie Carlin POUR, DO;  [facility];  Service: Endoscopy;  Laterality: N/A;  2:00 pm, asa 3   ESOPHAGOGASTRODUODENOSCOPY (EGD) WITH PROPOFOL  N/A  08/14/2014   SLF: 1. Heme postive stools due to gastric and colon polyps 2. Multiple gastric    ESOPHAGOGASTRODUODENOSCOPY (EGD) WITH PROPOFOL  N/A 03/12/2015   SLF: Multiple gastric nodules seen in gasric body/antrum. 2. Non-erosive gastritis ( inflammation) was found in the gastric antrum.    ESOPHAGOGASTRODUODENOSCOPY (EGD) WITH PROPOFOL  N/A 10/08/2015   Procedure: ESOPHAGOGASTRODUODENOSCOPY (EGD) WITH PROPOFOL ;  Surgeon: Margo LITTIE Haddock, MD;  [facility];  Service: Endoscopy;  Laterality: N/A;  0945   ESOPHAGOGASTRODUODENOSCOPY (EGD) WITH PROPOFOL  N/A 04/21/2016   Procedure: ESOPHAGOGASTRODUODENOSCOPY (EGD) WITH PROPOFOL ;  Surgeon: Margo LITTIE Haddock, MD;  [facility];  Service: Endoscopy;  Laterality: N/A;  730    GOLD SEED IMPLANT N/A 06/10/2023   Procedure: GOLD SEED IMPLANT;  Surgeon: Sherrilee Belvie LITTIE, MD;  Location: [facility];  Service: Urology;  Laterality: N/A;   HAND SURGERY Right    fracture repair with plates   HEMOSTASIS CLIP PLACEMENT  11/29/2023   Procedure: CONTROL OF HEMORRHAGE, GI TRACT, ENDOSCOPIC, BY CLIPPING OR OVERSEWING;  Surgeon: Cindie Carlin POUR, DO;  [facility];  Service: Endoscopy;;   INGUINAL HERNIA REPAIR Left 11/09/2018   Procedure: HERNIA REPAIR INGUINAL ADULT WITH MESH;  Surgeon: Kallie Manuelita BROCKS, MD;  Location: [facility];  Service: General;  Laterality: Left;   JOINT REPLACEMENT Bilateral    knees   POLYPECTOMY  08/14/2014   Procedure: POLYPECTOMY;  Surgeon: Margo LITTIE Haddock, MD;  Location: [facility];  Service: Endoscopy;;   POLYPECTOMY N/A 11/06/2014   Procedure: POLYPECTOMY;  Surgeon: Margo LITTIE Haddock, MD;  Location: [facility];  Service: Endoscopy;  Laterality: N/A;  Transverse Colon x3, Ascending Colon x2, Descending Colon x3   POLYPECTOMY  11/29/2023   Procedure: POLYPECTOMY;  Surgeon: Cindie Carlin POUR, DO;  [facility];  Service: Endoscopy;;   REPLACEMENT TOTAL KNEE BILATERAL     SPACE OAR INSTILLATION N/A 06/10/2023    Procedure: SPACE OAR INSTILLATION;  Surgeon: Sherrilee Belvie LITTIE, MD;  Location: [facility];  Service: Urology;  Laterality: N/A;   UPPER GASTROINTESTINAL ENDOSCOPY  APR 2009   INFLAMED HYPERPLASTIC POLYPS, CHRONIC GASTRITIS   Family History: Family History  Problem Relation Age of Onset   Diabetes Mother    Renal Disease Mother        failure   Diabetes Father        'old age'   Hypertension Sister    Hypertension Brother    Diabetes Brother    Diabetes Sister    Diabetes Sister    Diabetes Brother    Hypertension Brother    Diabetes Brother        right BKA   Kidney failure Brother        on dialysis   Kidney failure Brother        on dialysis   Diabetes Brother        bilateral BKA   Colon polyps Neg  Hx    Colon cancer Neg Hx    Pancreatic disease Neg Hx    Social History:  Social History   Tobacco Use   Smoking status: Never    Passive exposure: Never   Smokeless tobacco: Never   Tobacco comments:    Quit x 10 years/ never smoked on regular basis  Substance Use Topics   Alcohol use: Yes    Alcohol/week: 2.0 standard drinks of alcohol    Types: 2 Shots of liquor per week    Comment: drinks on weekends, gin/vodka 1/5th.   Medications: Current Outpatient Medications on File Prior to Visit  Medication Sig Dispense Refill   amLODipine  (NORVASC ) 10 MG tablet Take 10 mg by mouth daily.     colchicine  0.6 MG tablet TAKE 1 TABLET BY MOUTH 3 TIMES DAILY FOR 5 DAYS FOR GOUT PAIN. 15 tablet 3   levothyroxine  (SYNTHROID , LEVOTHROID) 200 MCG tablet Take 1 tablet (200 mcg total) by mouth daily before breakfast. 90 tablet 3   metoprolol  succinate (TOPROL -XL) 25 MG 24 hr tablet Take 1.5 tablets (37.5 mg total) by mouth daily. 45 tablet 6   potassium chloride  (MICRO-K ) 10 MEQ CR capsule Take 10 mEq by mouth daily.     atorvastatin  (LIPITOR) 10 MG tablet Take 10 mg by mouth daily.     Cholecalciferol (VITAMIN D -3 PO) Take 2,000 Units by mouth daily.     Copper  Gluconate 2 MG TABS  Take 2 tablets (4 mg total) by mouth daily. 180 tablet 3   cyanocobalamin  (VITAMIN B12) 1000 MCG tablet Take 1,000 mcg by mouth daily.     folic acid  (FOLVITE ) 1 MG tablet Take 1 tablet (1 mg total) by mouth daily. 90 tablet 3   HYDROcodone -acetaminophen  (NORCO) 7.5-325 MG tablet One tablet every six hours as needed for pain. 50 tablet 0   levothyroxine  (SYNTHROID ) 50 MCG tablet Take 50 mcg by mouth every morning.     Misc Natural Products (GLUCOSAMINE-CHONDROITIN SULF PO) Take 1 capsule by mouth daily. 1500mg /1200mg      pantoprazole  (PROTONIX ) 40 MG tablet Take 1 tablet (40 mg total) by mouth 2 (two) times daily. 60 tablet 11   tamsulosin  (FLOMAX ) 0.4 MG CAPS capsule Take 1 capsule (0.4 mg total) by mouth daily. 90 capsule 3   No current facility-administered medications on file prior to visit.   Allergies: Allergies  Allergen Reactions   Aspirin Other (See Comments)    Diverticular Bleed   Physical Exam: VS:  BP (!) 115/54 (BP Location: Right Arm)   Pulse 71   Ht 6' 3 (1.905 m)   Wt 188 lb (85.3 kg)   BMI 23.50 kg/m , BMI Body mass index is 23.5 kg/m.  Wt Readings from Last 3 Encounters:  04/14/24 188 lb (85.3 kg)  01/12/24 193 lb (87.5 kg)  01/10/24 191 lb 9.6 oz (86.9 kg)    General: Patient appears comfortable at rest. HEENT: Conjunctiva and lids normal. Neck: Supple, no elevated JVP or carotid bruits. Lungs: Clear to auscultation, nonlabored breathing at rest. Cardiac: Regular rate and rhythm, no S3, 1/6 systolic murmur. Abdomen: Soft, nontender, bowel sounds present. Extremities: 1-2+ ankle and lower leg edema.. Skin: Warm and dry. Musculoskeletal: No kyphosis. Neuropsychiatric: Alert and oriented x3, affect grossly appropriate.  ECG:  An ECG dated 10/06/2023 was personally reviewed today and demonstrated:  Sinus tachycardia with burst of SVT, nonspecific ST-T changes.  Labwork: 10/06/2023: Magnesium  1.4; TSH 0.580 12/30/2023: B Natriuretic Peptide 126.0 01/14/2024:  ALT 35; AST 57; BUN  11; Creatinine, Ser 1.27; Potassium 4.2; Sodium 139 04/03/2024: Hemoglobin 11.0; Platelets 115     Component Value Date/Time   CHOL 150 01/20/2017 0926   TRIG 154 (H) 01/20/2017 0926   HDL 29 (L) 01/20/2017 0926   CHOLHDL 5.2 (H) 01/20/2017 0926   VLDL 31 (H) 01/20/2017 0926   LDLCALC 90 01/20/2017 0926   Other Studies Reviewed Today:  Echocardiogram 05/26/2017: - Left ventricle: The cavity size was normal. Wall thickness was    normal. Systolic function was normal. The estimated ejection    fraction was in the range of 55% to 60%. Diastolic function is    abnormal, indeterminant grade. Wall motion was normal; there were    no regional wall motion abnormalities.  - Aortic valve: Valve area (VTI): 2.39 cm^2. Valve area (Vmax):    2.39 cm^2. Valve area (Vmean): 2.11 cm^2.  - Left atrium: The atrium was moderately dilated.  - Atrial septum: No defect or patent foramen ovale was identified.  - Tricuspid valve: There was mild-moderate regurgitation.  - Pulmonary arteries: Systolic pressure was moderately increased.    PA peak pressure: 46 mm Hg (S).  - Technically adequate study.   Assessment and Plan:  1.  PSVT, patient reports no accelerating symptoms and indicates comfort with current dose of Toprol -XL.  We did discuss up titration if necessary however.  ECG reviewed today.  Continue with observation.  2.  Primary hypertension, currently on Norvasc  10 mg daily with good blood pressure control today.  He does have fluctuating lower leg edema which could be related to use of Norvasc .  No clear indication for standing diuretic at this point.  3.  Mixed hyperlipidemia, on Lipitor 10 mg daily with follow-up by PCP.  I do not have a recent lipid panel for review.  Disposition:  Follow up 1 year.  Signed, Jayson JUDITHANN Sierras, M.D., F.A.C.C. [facility]

## 2024-04-19 ENCOUNTER — Inpatient Hospital Stay: Admitting: Physician Assistant

## 2024-04-19 NOTE — Progress Notes (Deleted)
 [facility] [address], [place] [address]   CLINIC:  Medical Oncology/Hematology  PCP:  Vick Lurie, FNP [address] [place] [address] [phone]  REASON FOR VISIT:  Follow-up for neuroendocrine carcinoid tumor  CURRENT THERAPY: Surveillance  INTERVAL HISTORY:   [name] 73 y.o. male returns for routine follow-up of neuroendocrine carcinoid tumor.   He was last seen by Pleasant Barefoot PA-C on 12/06/2023.  At today's visit, he reports feeling ***. ***He denies any nausea/vomiting, abdominal pain, flushing, wheezing, heart palpations, constipation, diarrhea.    ***He denies any rectal bleeding or melena, but stool is dark after iron . ***No abnormal bruising. ***He denies any B symptoms such as fever, chills, night sweats, unintentional weight loss. ***He reports that he has not had any alcoholic drinks for the past 2 weeks.  ***He has 75***% energy and 20***% appetite. He endorses that he is maintaining a stable weight.  ASSESSMENT & PLAN:    *** RESUME PREP BELOW ***   1.  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 Harvey on 08/14/2014: Gastric polyp showed low-grade neuroendocrine tumor.  Stomach biopsy showed focal involvement by low-grade neuroendocrine tumor. - Based on pathology from 08/14/2014, there was concern of residual disease or more extensive disease within his stomach.  Octreotide  scan on 09/19/2014 was negative for any avid metastases. - EGD with gastric biopsy on 03/12/2015 was negative for dysplasia or malignancy. - EGD with polypectomy by Dr. Harvey on 10/08/2015: Gastric polyp with well-differentiated neuroendocrine tumor  - His case was presented at GI tumor board in Feb/March 2016 with recommendation for anatomic biopsies to guide role of surgical resection.  Anatomic biopsies throughout stomach on 04/21/2016 were negative. - Most recent EGD (11/29/2023,  with Dr. Cindie): Gastritis, multiple gastric polyps (including some with bleeding), and nonbleeding gastric ulcers with friable tissue.  Polyp pathology revealed hyperplastic gastric polyps with erosion, inflammation, and reactive changes (negative for neuroendocrine tumor, adenomatous change, and malignancy).  Biopsy of stomach showed chronic gastritis (stomach antrum) and atrophic gastritis with diffuse intestinal metaplasia and glandular atrophy (stomach biopsy). - He does not currently have any signs or symptoms of carcinoid syndrome.*** - Most recent labs (10/26/2023): Chromogranin A elevated 215.5 (most recent EGD/biopsy was negative for any neuroendocrine tumor) - note that chromogranin can be elevated in the presence of PPI, however patient did not start PPI until after this lab was checked. - PLAN: Repeat LFTs and chromogranin A in 6 months (September 2025) - If symptomatic or significantly elevated chromogranin A, consider checking 24-hour urine 5-HIAA and/or dotatate-PET **DISCUSSION WITH MD (Dr. Rogers) on 12/07/2023: Reviewed patient history and pathology from most recent EGD.  Per Dr. Rogers, no further imaging or EGD (for oncology surveillance), unless significant lab changes, symptoms, or other indication.  Will continue to monitor labs.  2.  Normocytic anemia + thrombocytopenia - Mild intermittent anemia since at least 2020.  Mild to moderate intermittent thrombocytopenia since at least 2010, with episode of severe thrombocytopenia during hospitalization in 2008. - Most recent EGD (11/29/2023, with Dr. Cindie): Gastritis, multiple gastric polyps (including some with bleeding), and nonbleeding gastric ulcers with friable tissue.  Polyp pathology revealed hyperplastic gastric polyps with erosion, inflammation, and reactive changes (negative for neuroendocrine tumor, adenomatous change, and malignancy).  Biopsy of stomach showed chronic gastritis (stomach antrum) and atrophic gastritis  with diffuse intestinal metaplasia and glandular atrophy (stomach biopsy). - CT abdomen/pelvis (04/24/2019): No focal liver abnormality.  Normal-sized spleen. - US  abdomen (11/18/2023): Possible fatty liver infiltration.  Normal spleen. - Most recent CBC/D (10/26/2023): Hgb 10.2/MCV 99.4.  Platelets 95.  WBC 4.0/normal differential. - Hematology workup: Normal iron  stores (February 2025) with ferritin 478/iron  saturation 36% Creatinine 1.17/GFR >60.  Normal LDH. Low folate (4.1).  Low copper  (58).  Normal B12, MMA.   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  Normal reticulocytes. -CBC/D today (12/06/2023): Hgb 10.3/MCV 99.4.  Ferritin 615, iron  saturation 47%. - He denies any rectal bleeding or melena. No abnormal bruising. He denies any B symptoms such as fever, chills, night sweats, unintentional weight loss.   - He reports that he drinks about 2 drinks on the weekends. - He is at risk for nutritional deficiencies due to alcohol consumption.  He also had evidence of friable tissue with risk of bleeding, although his iron  levels are normal.    - PLAN:  Start folic acid  1 mg daily + copper  4 mg daily  - Patient can STOP iron  pill at this time.  - RTC in 3 months with repeat CBC/D, folate, copper , ferritin, iron /TIBC.  We will also check inflammatory/autoimmune labs with RF, ANA, ESR, CRP. - If persistent anemia and thrombocytopenia of unclear etiology, would consider bone marrow biopsy.    3.  Abnormal SPEP - Workup of anemia and thrombocytopenia (11/01/2023) revealed the following: SPEP with M spike 0.6%. Serum immunofixation shows polyclonal increase in 1 or more immunoglobulins Normal FLC ratio 1.22, but elevated kappa 108.8 and elevated lambda 88.9 - PLAN: Recommended that patient complete 24-hour urine/UPEP and skeletal survey, but he would like to hold off on this for now because he believes that we are running too many tests.  We  will discuss this again at his next visit. - Will plan on rechecking MGUS/myeloma panel (in addition to beta-2 microglobulin) in 6 months (around September/October 2025).  4.  Pulmonary nodule: - 6 mm LUL pulmonary nodule on neck PET scan done on 10/18/2018. - CT chest from September 2020 revealed stable left lung nodule. - Repeat CT chest from 07/11/2020 showed interval development of multiple patchy areas of groundglass attenuation scattered throughout both lungs.  Other nonspecific these are favored to represent sequelae of inflammation or infection.  Atypical viral infection not excluded.  This is likely related to recent COVID-19 diagnosis.  The 7 mm pulmonary nodule remains stable.  Thought to be a benign perifissural lymph node.  5.  Prostate cancer: - Prostate biopsy (03/08/2023): Right lateral base Gleason 3+4=7, grade group 2, 5% of 1 core, PSA 6.0 - Placement of SpaceOAR by Dr. Sherrilee on 06/10/2023.  Completed prostatic radiation with Dr. Dannielle in [place] from 06/30/2023 through 08/10/2023 - Most recent PSA (10/26/2023) within normal limits at 0.78 - PLAN: Continue follow-up with urology and radiation oncology   PLAN SUMMARY:  >>  Labs in 3 months = CBC/D, folate, copper , ferritin, iron /TIBC, RF, ANA, ESR, CRP >> OFFICE visit in 3 months (1 week after labs)     REVIEW OF SYSTEMS:  Review of Systems  Constitutional:  Positive for appetite change. Negative for chills, diaphoresis, fatigue, fever and unexpected weight change.  HENT:   Negative for lump/mass and nosebleeds.   Eyes:  Negative for eye problems.  Respiratory:  Negative for cough, hemoptysis and shortness of breath.   Cardiovascular:  Negative for chest pain, leg swelling and palpitations.  Gastrointestinal:  Negative for abdominal pain, blood in stool, constipation, diarrhea, nausea and vomiting.  Genitourinary:  Negative for hematuria.   Musculoskeletal:  Positive for arthralgias.  Skin: Negative.   Neurological:   Negative for dizziness, headaches and light-headedness.  Hematological:  Does not bruise/bleed easily.     PHYSICAL EXAM:  ECOG PERFORMANCE STATUS: 1 - Symptomatic but completely ambulatory  There were no vitals filed for this visit.   There were no vitals filed for this visit.   Physical Exam Vitals reviewed.  Constitutional:      Appearance: Normal appearance.  Cardiovascular:     Rate and Rhythm: Regular rhythm. Tachycardia present.     Heart sounds: Normal heart sounds.  Pulmonary:     Breath sounds: Wheezing (faint wheezing of bilateral lower lobes) present.  Neurological:     Mental Status: He is alert.  Psychiatric:        Mood and Affect: Mood normal.        Behavior: Behavior normal.    PAST MEDICAL/SURGICAL HISTORY:  Past Medical History:  Diagnosis Date   Anemia    FeDA: GASTRIC POLYPS, B12; TCS 2008 EGD 2009, 2008-HB 11.1 MCV 83.6 CR 1.22, 2009 FERRITIN 102-22   B12 deficiency    Carcinoid tumor determined by biopsy of stomach 09/25/2014   Chronic knee pain    Diverticulosis of colon    Lower GI bleed 2008   Essential hypertension    GERD (gastroesophageal reflux disease)    Gout    Hepatomegaly    Hepatic steatosis   History of alcohol abuse    History of septic arthritis    Hypothyroidism    PSVT (paroxysmal supraventricular tachycardia) (HCC)    PUD    Substance abuse (HCC)    Past Surgical History:  Procedure Laterality Date   BIOPSY  08/14/2014   Procedure: BIOPSY;  Surgeon: Margo LITTIE Haddock, MD;  Location: [facility];  Service: Endoscopy;;   BIOPSY  03/12/2015   Procedure: BIOPSY;  Surgeon: Margo LITTIE Haddock, MD;  Location: [facility];  Service: Endoscopy;;   BIOPSY  04/21/2016   Procedure: BIOPSY;  Surgeon: Margo LITTIE Haddock, MD;  [facility];  Service: Endoscopy;;  bx's of antrum, body of stomach, fundus, and cardia   CHOLECYSTECTOMY     COLONOSCOPY  2008 [facility] DJ   LGIB 2o to TICS, prep good   COLONOSCOPY WITH PROPOFOL  N/A 08/14/2014   SLF:  1. Four large colon polyps removed. 2. The left colon is redundant 3. Moderate diverticulosis throughout teh entire examined colon   COLONOSCOPY WITH PROPOFOL  N/A 11/06/2014   SLF: 8 small polyps removed. One large pedunculated polyp removed from the ascending colon, tubulovillous and tubular adenomas. Next colonoscopy March 2019   COLONOSCOPY WITH PROPOFOL  N/A 01/04/2018   Procedure: COLONOSCOPY WITH PROPOFOL ;  Surgeon: Haddock Margo LITTIE, MD;  [facility];  Service: Endoscopy;  Laterality: N/A;  10:00am   ESOPHAGEAL BANDING N/A 11/29/2023   Procedure: ESOPHAGOSCOPY, WITH ESOPHAGEAL VARICES BAND LIGATION;  Surgeon: Cindie Carlin POUR, DO;  [facility]: [facility];  Service: Endoscopy;  Laterality: N/A;   ESOPHAGOGASTRODUODENOSCOPY  12/08/2007   DOQ:Qpcz gastric polyps seen in the cardia and body of the stomach/Normal esophagus without evidence of Barrett's, mass, erosion/Normal duodenal bulb and second portion of the duodenum. Benign bx.   ESOPHAGOGASTRODUODENOSCOPY N/A 11/29/2023   Procedure: EGD (ESOPHAGOGASTRODUODENOSCOPY);  Surgeon: Cindie Carlin POUR, DO;  [facility];  Service: Endoscopy;  Laterality: N/A;  2:00 pm, asa 3   ESOPHAGOGASTRODUODENOSCOPY (EGD) WITH PROPOFOL  N/A 08/14/2014   SLF: 1. Heme postive  stools due to gastric and colon polyps 2. Multiple gastric    ESOPHAGOGASTRODUODENOSCOPY (EGD) WITH PROPOFOL  N/A 03/12/2015   SLF: Multiple gastric nodules seen in gasric body/antrum. 2. Non-erosive gastritis ( inflammation) was found in the gastric antrum.    ESOPHAGOGASTRODUODENOSCOPY (EGD) WITH PROPOFOL  N/A 10/08/2015   Procedure: ESOPHAGOGASTRODUODENOSCOPY (EGD) WITH PROPOFOL ;  Surgeon: Margo LITTIE Haddock, MD;  [facility];  Service: Endoscopy;  Laterality: N/A;  0945   ESOPHAGOGASTRODUODENOSCOPY (EGD) WITH PROPOFOL  N/A 04/21/2016   Procedure: ESOPHAGOGASTRODUODENOSCOPY (EGD) WITH PROPOFOL ;  Surgeon: Margo LITTIE Haddock, MD;  [facility];  Service: Endoscopy;   Laterality: N/A;  730    GOLD SEED IMPLANT N/A 06/10/2023   Procedure: GOLD SEED IMPLANT;  Surgeon: Sherrilee Belvie LITTIE, MD;  Location: [facility];  Service: Urology;  Laterality: N/A;   HAND SURGERY Right    fracture repair with plates   HEMOSTASIS CLIP PLACEMENT  11/29/2023   Procedure: CONTROL OF HEMORRHAGE, GI TRACT, ENDOSCOPIC, BY CLIPPING OR OVERSEWING;  Surgeon: Cindie Carlin POUR, DO;  [facility]: [facility];  Service: Endoscopy;;   INGUINAL HERNIA REPAIR Left 11/09/2018   Procedure: HERNIA REPAIR INGUINAL ADULT WITH MESH;  Surgeon: Kallie Manuelita BROCKS, MD;  Location: [facility];  Service: General;  Laterality: Left;   JOINT REPLACEMENT Bilateral    knees   POLYPECTOMY  08/14/2014   Procedure: POLYPECTOMY;  Surgeon: Margo LITTIE Haddock, MD;  [facility]: [facility];  Service: Endoscopy;;   POLYPECTOMY N/A 11/06/2014   Procedure: POLYPECTOMY;  Surgeon: Margo LITTIE Haddock, MD;  Location: [facility];  Service: Endoscopy;  Laterality: N/A;  Transverse Colon x3, Ascending Colon x2, Descending Colon x3   POLYPECTOMY  11/29/2023   Procedure: POLYPECTOMY;  Surgeon: Cindie Carlin POUR, DO;  [facility];  Service: Endoscopy;;   REPLACEMENT TOTAL KNEE BILATERAL     SPACE OAR INSTILLATION N/A 06/10/2023   Procedure: SPACE OAR INSTILLATION;  Surgeon: Sherrilee Belvie LITTIE, MD;  Location: [facility];  Service: Urology;  Laterality: N/A;   UPPER GASTROINTESTINAL ENDOSCOPY  [facility] 2009   INFLAMED HYPERPLASTIC POLYPS, CHRONIC GASTRITIS    SOCIAL HISTORY:  Social History   Socioeconomic History   Marital status: Single    Spouse name: Not on file   Number of children: 0   Years of education: 3   Highest education level: Not on file  Occupational History   Occupation: retired    Comment: farming  Tobacco Use   Smoking status: Never    Passive exposure: Never   Smokeless tobacco: Never   Tobacco comments:    Quit x 10 years/ never smoked on regular basis  Vaping Use   Vaping status: Never Used  Substance and Sexual  Activity   Alcohol use: Yes    Alcohol/week: 2.0 standard drinks of alcohol    Types: 2 Shots of liquor per week    Comment: drinks on weekends, gin/vodka 1/5th.   Drug use: Not Currently   Sexual activity: Never  Other Topics Concern   Not on file  Social History Narrative   HE DOES NOT HAVE ANY CHILDREN.   Lives alone   Does not drive   CAN NOT READ   Social Drivers of Health   Financial Resource Strain: Low Risk  (07/30/2020)   Overall Financial Resource Strain (CARDIA)    Difficulty of Paying Living Expenses: Not hard at all  Food Insecurity: No Food Insecurity (04/13/2023)   Hunger Vital Sign    Worried About Running Out of Food  in the Last Year: Never true    Ran Out of Food in the Last Year: Never true  Transportation Needs: No Transportation Needs (04/13/2023)   PRAPARE - Administrator, Civil Service (Medical): No    Lack of Transportation (Non-Medical): No  Physical Activity: Insufficiently Active (07/30/2020)   Exercise Vital Sign    Days of Exercise per Week: 7 days    Minutes of Exercise per Session: 20 min  Stress: No Stress Concern Present (07/30/2020)   [employer] of Occupational Health - Occupational Stress Questionnaire    Feeling of Stress : Not at all  Social Connections: Socially Isolated (07/30/2020)   Social Connection and Isolation Panel    Frequency of Communication with Friends and Family: Never    Frequency of Social Gatherings with Friends and Family: Once a week    Attends Religious Services: Never    Database administrator or Organizations: No    Attends Banker Meetings: Never    Marital Status: Never married  Intimate Partner Violence: Not At Risk (04/13/2023)   Humiliation, Afraid, Rape, and Kick questionnaire    Fear of Current or Ex-Partner: No    Emotionally Abused: No    Physically Abused: No    Sexually Abused: No    FAMILY HISTORY:  Family History  Problem Relation Age of Onset   Diabetes Mother     Renal Disease Mother        failure   Diabetes Father        'old age'   Hypertension Sister    Hypertension Brother    Diabetes Brother    Diabetes Sister    Diabetes Sister    Diabetes Brother    Hypertension Brother    Diabetes Brother        right BKA   Kidney failure Brother        on dialysis   Kidney failure Brother        on dialysis   Diabetes Brother        bilateral BKA   Colon polyps Neg Hx    Colon cancer Neg Hx    Pancreatic disease Neg Hx     CURRENT MEDICATIONS:  Outpatient Encounter Medications as of 04/19/2024  Medication Sig Note   amLODipine  (NORVASC ) 10 MG tablet Take 10 mg by mouth daily.    atorvastatin  (LIPITOR) 10 MG tablet Take 10 mg by mouth daily.    Cholecalciferol (VITAMIN D -3 PO) Take 2,000 Units by mouth daily.    colchicine  0.6 MG tablet TAKE 1 TABLET BY MOUTH 3 TIMES DAILY FOR 5 DAYS FOR GOUT PAIN.    Copper  Gluconate 2 MG TABS Take 2 tablets (4 mg total) by mouth daily.    cyanocobalamin  (VITAMIN B12) 1000 MCG tablet Take 1,000 mcg by mouth daily.    folic acid  (FOLVITE ) 1 MG tablet Take 1 tablet (1 mg total) by mouth daily.    HYDROcodone -acetaminophen  (NORCO) 7.5-325 MG tablet One tablet every six hours as needed for pain.    levothyroxine  (SYNTHROID ) 50 MCG tablet Take 50 mcg by mouth every morning. 10/06/2023: Take with 200 mcg   levothyroxine  (SYNTHROID , LEVOTHROID) 200 MCG tablet Take 1 tablet (200 mcg total) by mouth daily before breakfast.    metoprolol  succinate (TOPROL -XL) 25 MG 24 hr tablet Take 25 mg by mouth at bedtime.    Misc Natural Products (GLUCOSAMINE-CHONDROITIN SULF PO) Take 1 capsule by mouth daily. 1500mg /1200mg     pantoprazole  (PROTONIX ) 40 MG  tablet Take 1 tablet (40 mg total) by mouth 2 (two) times daily.    potassium chloride  (MICRO-K ) 10 MEQ CR capsule Take 10 mEq by mouth daily.    tamsulosin  (FLOMAX ) 0.4 MG CAPS capsule Take 1 capsule (0.4 mg total) by mouth daily.    No facility-administered encounter  medications on file as of 04/19/2024.    ALLERGIES:  Allergies  Allergen Reactions   Aspirin Other (See Comments)    Diverticular Bleed    LABORATORY DATA:  I have reviewed the labs as listed.  CBC    Component Value Date/Time   WBC 4.2 04/03/2024 0908   RBC 3.48 (L) 04/03/2024 0908   HGB 11.0 (L) 04/03/2024 0908   HGB 10.2 (L) 01/14/2024 0906   HCT 34.4 (L) 04/03/2024 0908   HCT 31.1 (L) 01/14/2024 0906   HCT 44 01/05/2013 0000   PLT 115 (L) 04/03/2024 0908   PLT 131 (L) 01/14/2024 0906   MCV 98.9 04/03/2024 0908   MCV 100 (H) 01/14/2024 0906   MCH 31.6 04/03/2024 0908   MCHC 32.0 04/03/2024 0908   RDW 13.9 04/03/2024 0908   RDW 13.3 01/14/2024 0906   LYMPHSABS 1.7 04/03/2024 0908   LYMPHSABS 2.1 01/14/2024 0906   MONOABS 0.5 04/03/2024 0908   EOSABS 0.2 04/03/2024 0908   EOSABS 0.2 01/14/2024 0906   BASOSABS 0.0 04/03/2024 0908   BASOSABS 0.0 01/14/2024 0906      Latest Ref Rng & Units 01/14/2024    9:06 AM 12/30/2023    1:57 PM 10/26/2023   11:02 AM  CMP  Glucose 70 - 99 mg/dL 896  96  892   BUN 8 - 27 mg/dL 11  20  16    Creatinine 0.76 - 1.27 mg/dL 8.72  8.63  8.82   Sodium 134 - 144 mmol/L 139  135  136   Potassium 3.5 - 5.2 mmol/L 4.2  3.7  4.7   Chloride 96 - 106 mmol/L 105  106  104   CO2 20 - 29 mmol/L 21  22  25    Calcium  8.6 - 10.2 mg/dL 9.2  8.6  9.1   Total Protein 6.0 - 8.5 g/dL 6.9  6.5  7.5   Total Bilirubin 0.0 - 1.2 mg/dL 0.4  0.9  1.0   Alkaline Phos 44 - 121 IU/L 127  90  83   AST 0 - 40 IU/L 57  58  62   ALT 0 - 44 IU/L 35  38  41     DIAGNOSTIC IMAGING:  I have independently reviewed the relevant imaging and discussed with the patient.   WRAP UP:  All questions were answered. The patient knows to call the clinic with any problems, questions or concerns.  Medical decision making: Moderate  Time spent on visit: I spent 20 minutes counseling the patient face to face. The total time spent in the appointment was 30 minutes and more than  50% was on counseling.  Pleasant CHRISTELLA Barefoot, PA-C  04/19/24 8:02 AM

## 2024-04-26 ENCOUNTER — Ambulatory Visit (INDEPENDENT_AMBULATORY_CARE_PROVIDER_SITE_OTHER): Admitting: Orthopaedic Surgery

## 2024-04-26 ENCOUNTER — Encounter: Payer: Self-pay | Admitting: Orthopaedic Surgery

## 2024-04-26 DIAGNOSIS — M1A0411 Idiopathic chronic gout, right hand, with tophus (tophi): Secondary | ICD-10-CM | POA: Diagnosis not present

## 2024-04-26 MED ORDER — HYDROCODONE-ACETAMINOPHEN 7.5-325 MG PO TABS: ORAL_TABLET | ORAL | 0 refills | Status: DC

## 2024-04-26 NOTE — Progress Notes (Signed)
 My hands are better.  He has gout of the right hand and wrist.  He has no new attacks.  He has tophi of the right dorsal wrist.    NV intact.  ROM is good.    Encounter Diagnosis  Name Primary?   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  Return in three months.  I have reviewed the [place]  Controlled Substance Reporting System web site prior to prescribing narcotic medicine for this patient.  I have informed the patient I will be retiring from medical practice and from this office on June 01, 2024.  The patient has been offered continuing care with Dr. Margrette or Dr. Onesimo of this office.  The patient may choose another provider and the records will be forwarded after proper signature and notification.  Patient understands and agrees.  Call if any problem.  Precautions discussed.  Electronically Signed Lemond Stable, MD 8/27/20258:38 AM

## 2024-05-03 ENCOUNTER — Ambulatory Visit: Admitting: Cardiology

## 2024-05-03 NOTE — Progress Notes (Unsigned)
 [facility] [address] [place], [place] [address]   CLINIC:  Medical Oncology/Hematology  PCP:  Vick Lurie, FNP [address] [place] [address] [phone]  REASON FOR VISIT:  Follow-up for neuroendocrine carcinoid tumor  CURRENT THERAPY: Surveillance  INTERVAL HISTORY:   [name] 73 y.o. male returns for routine follow-up of neuroendocrine carcinoid tumor.   He was last seen by Pleasant Barefoot PA-C on 12/06/2023.  At today's visit, he reports feeling well. He denies any nausea/vomiting, abdominal pain, flushing, wheezing, heart palpations, constipation, diarrhea.    He denies any rectal bleeding or melena. No abnormal bruising. He denies any B symptoms such as fever, chills, night sweats, unintentional weight loss. He reports that he drinks about 2-3 beers on the weekends.  He is taking copper  4 mg daily and folic acid  1 mg daily. He has 75% energy and 85% appetite. He endorses that he is maintaining a stable weight.  ASSESSMENT & PLAN:  1.  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 Harvey on 08/14/2014: Gastric polyp showed low-grade neuroendocrine tumor.  Stomach biopsy showed focal involvement by low-grade neuroendocrine tumor. - Based on pathology from 08/14/2014, there was concern of residual disease or more extensive disease within his stomach.  Octreotide  scan on 09/19/2014 was negative for any avid metastases. - EGD with gastric biopsy on 03/12/2015 was negative for dysplasia or malignancy. - EGD with polypectomy by Dr. Harvey on 10/08/2015: Gastric polyp with well-differentiated neuroendocrine tumor  - His case was presented at GI tumor board in Feb/March 2016 with recommendation for anatomic biopsies to guide role of surgical resection.  Anatomic biopsies throughout stomach on 04/21/2016 were negative. - Most recent EGD (11/29/2023, with Dr. Cindie): Gastritis, multiple  gastric polyps (including some with bleeding), and nonbleeding gastric ulcers with friable tissue.  Polyp pathology revealed hyperplastic gastric polyps with erosion, inflammation, and reactive changes (negative for neuroendocrine tumor, adenomatous change, and malignancy).  Biopsy of stomach showed chronic gastritis (stomach antrum) and atrophic gastritis with diffuse intestinal metaplasia and glandular atrophy (stomach biopsy). - He does not currently have any signs or symptoms of carcinoid syndrome. - Most recent labs (10/26/2023): Chromogranin A elevated 215.5 (most recent EGD/biopsy was negative for any neuroendocrine tumor) - note that chromogranin can be elevated in the presence of PPI, however patient did not start PPI until after this lab was checked. - PLAN: Repeat LFTs + chromogranin A at follow-up in 3 months   - If symptomatic or significantly elevated chromogranin A, consider checking 24-hour urine 5-HIAA and/or dotatate-PET **DISCUSSION WITH MD (Dr. Rogers) on 12/07/2023: Reviewed patient history and pathology from most recent EGD.  Per Dr. Rogers, no further imaging or EGD (for oncology surveillance), unless significant lab changes, symptoms, or other indication.  Will continue to monitor labs.  2.  Normocytic anemia + thrombocytopenia - Mild intermittent anemia since at least 2020.  Mild to moderate intermittent thrombocytopenia since at least 2010, with episode of severe thrombocytopenia during hospitalization in 2008. - Most recent EGD (11/29/2023, with Dr. Cindie): Gastritis, multiple gastric polyps (including some with bleeding), and nonbleeding gastric ulcers with friable tissue.  Polyp pathology revealed hyperplastic gastric polyps with erosion, inflammation, and reactive changes (negative for neuroendocrine tumor, adenomatous change, and malignancy).  Biopsy of stomach showed chronic gastritis (stomach antrum) and atrophic gastritis with diffuse intestinal metaplasia and  glandular atrophy (stomach biopsy). - CT abdomen/pelvis (04/24/2019): No focal liver abnormality.  Normal-sized spleen. -  US  abdomen (11/18/2023): Possible fatty liver infiltration.  Normal spleen. - Hematology workup: Normal iron  stores (February 2025) with ferritin 478/iron  saturation 36% Creatinine 1.17/GFR >60.  Normal LDH. Low folate (4.1).  Low copper  (58).  Normal B12 at 1004, normal MMA.   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  Normal reticulocytes. - Most recent labs (04/03/2024):  Hgb 11.0/MCV 98.9.  Platelets 115.  WBC 4.2/normal differential. Ferritin 452, iron  saturation 36%, low TIBC 197 Normal folate 18.8.  Normal copper  75. ANA negative.  Rheumatoid factor positive (15.4).  Normal CRP, elevated ESR 56. - He denies any rectal bleeding or melena. No abnormal bruising. He denies any B symptoms such as fever, chills, night sweats, unintentional weight loss.   - He reports that he drinks about 2-3 beers on the weekends. - DIFFERENTIAL DIAGNOSIS: Suspect anemia of chronic disease/chronic inflammation.  He is at risk for nutritional deficiencies due to alcohol consumption.  He also had evidence of friable tissue with risk of bleeding, although his iron  levels are normal.    - PLAN: Hemoglobin slightly improved after starting folate and copper  supplementations. - CONTINUE folic acid  1 mg daily + copper  4 mg daily  - No indication to restart iron  pill at this time.  - RTC in 3 months with repeat CBC/D, folate, copper , ferritin, iron /TIBC.  We will also check CMP and Cystatin C to rule out occult kidney disease.  - If significant worsening of anemia and thrombocytopenia (Hgb <10.0 or platelets <100), would consider bone marrow biopsy.    3.  Abnormal SPEP - Workup of anemia and thrombocytopenia (11/01/2023) revealed the following: SPEP with M spike 0.6%. Serum immunofixation shows polyclonal increase in 1 or more  immunoglobulins Normal FLC ratio 1.22, but elevated kappa 108.8 and elevated lambda 88.9 - PLAN: Recommended that patient complete 24-hour urine/UPEP and skeletal survey - Will plan on rechecking MGUS/myeloma panel (in addition to beta-2 microglobulin) at next visit  4.  Pulmonary nodule: - 6 mm LUL pulmonary nodule on neck PET scan done on 10/18/2018. - CT chest from September 2020 revealed stable left lung nodule. - Repeat CT chest from 07/11/2020 showed interval development of multiple patchy areas of groundglass attenuation scattered throughout both lungs.  Other nonspecific these are favored to represent sequelae of inflammation or infection.  Atypical viral infection not excluded.  This is likely related to recent COVID-19 diagnosis.  The 7 mm pulmonary nodule remains stable.  Thought to be a benign perifissural lymph node.  5.  Prostate cancer: - Prostate biopsy (03/08/2023): Right lateral base Gleason 3+4=7, grade group 2, 5% of 1 core, PSA 6.0 - Placement of SpaceOAR by Dr. Sherrilee on 06/10/2023.  Completed prostatic radiation with Dr. Dannielle in [place] from 06/30/2023 through 08/10/2023 - Most recent PSA (10/26/2023) within normal limits at 0.78 - PLAN: Continue follow-up with urology and radiation oncology   PLAN SUMMARY:  >> SKELETAL SURVEY to be done TODAY >> 24-hour urine/UPEP + 24-hr urine 5HIAA >> Labs in 3 months = CBC/D, folate, copper , ferritin, iron /TIBC, CMP, Cystatin C, beta-2 microglobulin, SPEP, light chains, immunofixation, chromogranin A, LDH >> OFFICE visit in 3 months (1 week after labs)     REVIEW OF SYSTEMS:  Review of Systems  Constitutional:  Negative for appetite change, chills, diaphoresis, fatigue, fever and unexpected weight change.  HENT:   Negative for lump/mass and nosebleeds.   Eyes:  Negative for eye problems.  Respiratory:  Negative for cough, hemoptysis and shortness  of breath.   Cardiovascular:  Negative for chest pain, leg swelling and  palpitations.  Gastrointestinal:  Negative for abdominal pain, blood in stool, constipation, diarrhea, nausea and vomiting.  Genitourinary:  Negative for hematuria.   Musculoskeletal:  Negative for arthralgias.  Skin: Negative.   Neurological:  Negative for dizziness, headaches and light-headedness.  Hematological:  Does not bruise/bleed easily.     PHYSICAL EXAM:  ECOG PERFORMANCE STATUS: 1 - Symptomatic but completely ambulatory  Vitals:   05/04/24 1129  BP: 107/74  Pulse: 82  Resp: 18  Temp: (!) 96.2 F (35.7 C)  SpO2: 100%   Filed Weights   05/04/24 1129  Weight: 187 lb 2.7 oz (84.9 kg)   Physical Exam Vitals reviewed.  Constitutional:      Appearance: Normal appearance.  Cardiovascular:     Rate and Rhythm: Normal rate and regular rhythm.     Heart sounds: Normal heart sounds.  Pulmonary:     Breath sounds: Wheezing (faint wheezing of bilateral lower lobes) present.  Neurological:     Mental Status: He is alert.  Psychiatric:        Mood and Affect: Mood normal.        Behavior: Behavior normal.    PAST MEDICAL/SURGICAL HISTORY:  Past Medical History:  Diagnosis Date   Anemia    FeDA: GASTRIC POLYPS, B12; TCS 2008 EGD 2009, 2008-HB 11.1 MCV 83.6 CR 1.22, 2009 FERRITIN 102-22   B12 deficiency    Carcinoid tumor determined by biopsy of stomach 09/25/2014   Chronic knee pain    Diverticulosis of colon    Lower GI bleed 2008   Essential hypertension    GERD (gastroesophageal reflux disease)    Gout    Hepatomegaly    Hepatic steatosis   History of alcohol abuse    History of septic arthritis    Hypothyroidism    PSVT (paroxysmal supraventricular tachycardia) (HCC)    PUD    Substance abuse (HCC)    Past Surgical History:  Procedure Laterality Date   BIOPSY  08/14/2014   Procedure: BIOPSY;  Surgeon: Margo LITTIE Haddock, MD;  Location: [facility];  Service: Endoscopy;;   BIOPSY  03/12/2015   Procedure: BIOPSY;  Surgeon: Margo LITTIE Haddock, MD;  Location: [facility];   Service: Endoscopy;;   BIOPSY  04/21/2016   Procedure: BIOPSY;  Surgeon: Margo LITTIE Haddock, MD;  [facility];  Service: Endoscopy;;  bx's of antrum, body of stomach, fundus, and cardia   CHOLECYSTECTOMY     COLONOSCOPY  2008 [facility] DJ   LGIB 2o to TICS, prep good   COLONOSCOPY WITH PROPOFOL  N/A 08/14/2014   SLF: 1. Four large colon polyps removed. 2. The left colon is redundant 3. Moderate diverticulosis throughout teh entire examined colon   COLONOSCOPY WITH PROPOFOL  N/A 11/06/2014   SLF: 8 small polyps removed. One large pedunculated polyp removed from the ascending colon, tubulovillous and tubular adenomas. Next colonoscopy March 2019   COLONOSCOPY WITH PROPOFOL  N/A 01/04/2018   Procedure: COLONOSCOPY WITH PROPOFOL ;  Surgeon: Haddock Margo LITTIE, MD;  [facility];  Service: Endoscopy;  Laterality: N/A;  10:00am   ESOPHAGEAL BANDING N/A 11/29/2023   Procedure: ESOPHAGOSCOPY, WITH ESOPHAGEAL VARICES BAND LIGATION;  Surgeon: Cindie Carlin POUR, DO;  [facility];  Service: Endoscopy;  Laterality: N/A;   ESOPHAGOGASTRODUODENOSCOPY  12/08/2007   DOQ:Qpcz gastric polyps seen in the cardia and body of the stomach/Normal esophagus without evidence of Barrett's, mass, erosion/Normal duodenal bulb and  second portion of the duodenum. Benign bx.   ESOPHAGOGASTRODUODENOSCOPY N/A 11/29/2023   Procedure: EGD (ESOPHAGOGASTRODUODENOSCOPY);  Surgeon: Cindie Carlin POUR, DO;  [facility];  Service: Endoscopy;  Laterality: N/A;  2:00 pm, asa 3   ESOPHAGOGASTRODUODENOSCOPY (EGD) WITH PROPOFOL  N/A 08/14/2014   SLF: 1. Heme postive stools due to gastric and colon polyps 2. Multiple gastric    ESOPHAGOGASTRODUODENOSCOPY (EGD) WITH PROPOFOL  N/A 03/12/2015   SLF: Multiple gastric nodules seen in gasric body/antrum. 2. Non-erosive gastritis ( inflammation) was found in the gastric antrum.    ESOPHAGOGASTRODUODENOSCOPY (EGD) WITH PROPOFOL  N/A 10/08/2015   Procedure: ESOPHAGOGASTRODUODENOSCOPY  (EGD) WITH PROPOFOL ;  Surgeon: Margo LITTIE Haddock, MD;  [facility];  Service: Endoscopy;  Laterality: N/A;  0945   ESOPHAGOGASTRODUODENOSCOPY (EGD) WITH PROPOFOL  N/A 04/21/2016   Procedure: ESOPHAGOGASTRODUODENOSCOPY (EGD) WITH PROPOFOL ;  Surgeon: Margo LITTIE Haddock, MD;  [facility];  Service: Endoscopy;  Laterality: N/A;  730    GOLD SEED IMPLANT N/A 06/10/2023   Procedure: GOLD SEED IMPLANT;  Surgeon: Sherrilee Belvie LITTIE, MD;  Location: [facility];  Service: Urology;  Laterality: N/A;   HAND SURGERY Right    fracture repair with plates   HEMOSTASIS CLIP PLACEMENT  11/29/2023   Procedure: CONTROL OF HEMORRHAGE, GI TRACT, ENDOSCOPIC, BY CLIPPING OR OVERSEWING;  Surgeon: Cindie Carlin POUR, DO;  [facility];  Service: Endoscopy;;   INGUINAL HERNIA REPAIR Left 11/09/2018   Procedure: HERNIA REPAIR INGUINAL ADULT WITH MESH;  Surgeon: Kallie Manuelita [place]OCK[place], MD;  Location: [facility];  Service: General;  Laterality: Left;   JOINT REPLACEMENT Bilateral    knees   POLYPECTOMY  08/14/2014   Procedure: POLYPECTOMY;  Surgeon: Margo LITTIE Haddock, MD;  [facility]: [facility];  Service: Endoscopy;;   POLYPECTOMY N/A 11/06/2014   Procedure: POLYPECTOMY;  Surgeon: Margo LITTIE Haddock, MD;  [facility]: [facility];  Service: Endoscopy;  Laterality: N/A;  Transverse Colon x3, Ascending Colon x2, Descending Colon x3   POLYPECTOMY  11/29/2023   Procedure: POLYPECTOMY;  Surgeon: Cindie Carlin POUR, DO;  [facility];  Service: Endoscopy;;   REPLACEMENT TOTAL KNEE BILATERAL     SPACE OAR INSTILLATION N/A 06/10/2023   Procedure: SPACE OAR INSTILLATION;  Surgeon: Sherrilee Belvie LITTIE, MD;  Location: [facility];  Service: Urology;  Laterality: N/A;   UPPER GASTROINTESTINAL ENDOSCOPY  [facility] 2009   INFLAMED HYPERPLASTIC POLYPS, CHRONIC GASTRITIS    SOCIAL HISTORY:  Social History   Socioeconomic History   Marital status: Single    Spouse name: Not on file   Number of children: 0   Years of education: 3   Highest  education level: Not on file  Occupational History   Occupation: retired    Comment: farming  Tobacco Use   Smoking status: Never    Passive exposure: Never   Smokeless tobacco: Never   Tobacco comments:    Quit x 10 years/ never smoked on regular basis  Vaping Use   Vaping status: Never Used  Substance and Sexual Activity   Alcohol use: Yes    Alcohol/week: 2.0 standard drinks of alcohol    Types: 2 Shots of liquor per week    Comment: drinks on weekends, gin/vodka 1/5th.   Drug use: Not Currently   Sexual activity: Never  Other Topics Concern   Not on file  Social History Narrative   HE DOES NOT HAVE ANY CHILDREN.   Lives alone   Does not drive   CAN NOT READ  Social Drivers of Corporate investment banker Strain: Low Risk  (07/30/2020)   Overall Financial Resource Strain (CARDIA)    Difficulty of Paying Living Expenses: Not hard at all  Food Insecurity: No Food Insecurity (04/13/2023)   Hunger Vital Sign    Worried About Running Out of Food in the Last Year: Never true    Ran Out of Food in the Last Year: Never true  Transportation Needs: No Transportation Needs (04/13/2023)   PRAPARE - Administrator, Civil Service (Medical): No    Lack of Transportation (Non-Medical): No  Physical Activity: Insufficiently Active (07/30/2020)   Exercise Vital Sign    Days of Exercise per Week: 7 days    Minutes of Exercise per Session: 20 min  Stress: No Stress Concern Present (07/30/2020)   [employer] of Occupational Health - Occupational Stress Questionnaire    Feeling of Stress : Not at all  Social Connections: Socially Isolated (07/30/2020)   Social Connection and Isolation Panel    Frequency of Communication with Friends and Family: Never    Frequency of Social Gatherings with Friends and Family: Once a week    Attends Religious Services: Never    Database administrator or Organizations: No    Attends Banker Meetings: Never    Marital  Status: Never married  Intimate Partner Violence: Not At Risk (04/13/2023)   Humiliation, Afraid, Rape, and Kick questionnaire    Fear of Current or Ex-Partner: No    Emotionally Abused: No    Physically Abused: No    Sexually Abused: No    FAMILY HISTORY:  Family History  Problem Relation Age of Onset   Diabetes Mother    Renal Disease Mother        failure   Diabetes Father        'old age'   Hypertension Sister    Hypertension Brother    Diabetes Brother    Diabetes Sister    Diabetes Sister    Diabetes Brother    Hypertension Brother    Diabetes Brother        right BKA   Kidney failure Brother        on dialysis   Kidney failure Brother        on dialysis   Diabetes Brother        bilateral BKA   Colon polyps Neg Hx    Colon cancer Neg Hx    Pancreatic disease Neg Hx     CURRENT MEDICATIONS:  Outpatient Encounter Medications as of 05/04/2024  Medication Sig Note   amLODipine  (NORVASC ) 10 MG tablet Take 10 mg by mouth daily.    atorvastatin  (LIPITOR) 10 MG tablet Take 10 mg by mouth daily.    Cholecalciferol (VITAMIN D -3 PO) Take 2,000 Units by mouth daily.    colchicine  0.6 MG tablet TAKE 1 TABLET BY MOUTH 3 TIMES DAILY FOR 5 DAYS FOR GOUT PAIN.    Copper  Gluconate 2 MG TABS Take 2 tablets (4 mg total) by mouth daily.    cyanocobalamin  (VITAMIN B12) 1000 MCG tablet Take 1,000 mcg by mouth daily.    febuxostat  (ULORIC ) 40 MG tablet Take 40 mg by mouth daily.    folic acid  (FOLVITE ) 1 MG tablet Take 1 tablet (1 mg total) by mouth daily.    HYDROcodone -acetaminophen  (NORCO) 7.5-325 MG tablet One tablet every six hours as needed for pain.    levothyroxine  (SYNTHROID ) 50 MCG tablet Take 50 mcg by mouth  every morning. 10/06/2023: Take with 200 mcg   levothyroxine  (SYNTHROID , LEVOTHROID) 200 MCG tablet Take 1 tablet (200 mcg total) by mouth daily before breakfast.    metoprolol  succinate (TOPROL -XL) 25 MG 24 hr tablet Take 25 mg by mouth at bedtime.    Misc Natural  Products (GLUCOSAMINE-CHONDROITIN SULF PO) Take 1 capsule by mouth daily. 1500mg /1200mg     pantoprazole  (PROTONIX ) 40 MG tablet Take 1 tablet (40 mg total) by mouth 2 (two) times daily.    potassium chloride  (MICRO-K ) 10 MEQ CR capsule Take 10 mEq by mouth daily.    sildenafil  (VIAGRA ) 100 MG tablet TAKE ONE-HALF TABLET BY MOUTH EVERY DAY AS NEEDED    tamsulosin  (FLOMAX ) 0.4 MG CAPS capsule Take 1 capsule (0.4 mg total) by mouth daily.    No facility-administered encounter medications on file as of 05/04/2024.    ALLERGIES:  Allergies  Allergen Reactions   Aspirin Other (See Comments)    Diverticular Bleed    LABORATORY DATA:  I have reviewed the labs as listed.  CBC    Component Value Date/Time   WBC 4.2 04/03/2024 0908   RBC 3.48 (L) 04/03/2024 0908   HGB 11.0 (L) 04/03/2024 0908   HGB 10.2 (L) 01/14/2024 0906   HCT 34.4 (L) 04/03/2024 0908   HCT 31.1 (L) 01/14/2024 0906   HCT 44 01/05/2013 0000   PLT 115 (L) 04/03/2024 0908   PLT 131 (L) 01/14/2024 0906   MCV 98.9 04/03/2024 0908   MCV 100 (H) 01/14/2024 0906   MCH 31.6 04/03/2024 0908   MCHC 32.0 04/03/2024 0908   RDW 13.9 04/03/2024 0908   RDW 13.3 01/14/2024 0906   LYMPHSABS 1.7 04/03/2024 0908   LYMPHSABS 2.1 01/14/2024 0906   MONOABS 0.5 04/03/2024 0908   EOSABS 0.2 04/03/2024 0908   EOSABS 0.2 01/14/2024 0906   BASOSABS 0.0 04/03/2024 0908   BASOSABS 0.0 01/14/2024 0906      Latest Ref Rng & Units 01/14/2024    9:06 AM 12/30/2023    1:57 PM 10/26/2023   11:02 AM  CMP  Glucose 70 - 99 mg/dL 896  96  892   BUN 8 - 27 mg/dL 11  20  16    Creatinine 0.76 - 1.27 mg/dL 8.72  8.63  8.82   Sodium 134 - 144 mmol/L 139  135  136   Potassium 3.5 - 5.2 mmol/L 4.2  3.7  4.7   Chloride 96 - 106 mmol/L 105  106  104   CO2 20 - 29 mmol/L 21  22  25    Calcium  8.6 - 10.2 mg/dL 9.2  8.6  9.1   Total Protein 6.0 - 8.5 g/dL 6.9  6.5  7.5   Total Bilirubin 0.0 - 1.2 mg/dL 0.4  0.9  1.0   Alkaline Phos 44 - 121 IU/L 127  90  83    AST 0 - 40 IU/L 57  58  62   ALT 0 - 44 IU/L 35  38  41     DIAGNOSTIC IMAGING:  I have independently reviewed the relevant imaging and discussed with the patient.   WRAP UP:  All questions were answered. The patient knows to call the clinic with any problems, questions or concerns.  Medical decision making: Moderate  Time spent on visit: I spent 20 minutes counseling the patient face to face. The total time spent in the appointment was 30 minutes and more than 50% was on counseling.  Pleasant CHRISTELLA Barefoot, PA-C  05/04/24 12:17 PM

## 2024-05-04 ENCOUNTER — Ambulatory Visit (HOSPITAL_COMMUNITY)
Admission: RE | Admit: 2024-05-04 | Discharge: 2024-05-04 | Disposition: A | Discharge status: Home / Self Care | Source: Ambulatory Visit | Attending: Physician Assistant | Admitting: Physician Assistant

## 2024-05-04 ENCOUNTER — Inpatient Hospital Stay: Attending: Physician Assistant | Admitting: Physician Assistant

## 2024-05-04 VITALS — BP 107/74 | HR 82 | Temp 96.2°F | Resp 18 | Wt 187.2 lb

## 2024-05-04 DIAGNOSIS — R778 Other specified abnormalities of plasma proteins: Secondary | ICD-10-CM | POA: Insufficient documentation

## 2024-05-04 DIAGNOSIS — D696 Thrombocytopenia, unspecified: Secondary | ICD-10-CM | POA: Diagnosis not present

## 2024-05-04 DIAGNOSIS — D472 Monoclonal gammopathy: Secondary | ICD-10-CM | POA: Insufficient documentation

## 2024-05-04 DIAGNOSIS — Z79899 Other long term (current) drug therapy: Secondary | ICD-10-CM | POA: Insufficient documentation

## 2024-05-04 DIAGNOSIS — E538 Deficiency of other specified B group vitamins: Secondary | ICD-10-CM

## 2024-05-04 DIAGNOSIS — Z923 Personal history of irradiation: Secondary | ICD-10-CM | POA: Insufficient documentation

## 2024-05-04 DIAGNOSIS — C7A8 Other malignant neuroendocrine tumors: Secondary | ICD-10-CM

## 2024-05-04 DIAGNOSIS — R911 Solitary pulmonary nodule: Secondary | ICD-10-CM | POA: Insufficient documentation

## 2024-05-04 DIAGNOSIS — E039 Hypothyroidism, unspecified: Secondary | ICD-10-CM | POA: Insufficient documentation

## 2024-05-04 DIAGNOSIS — I10 Essential (primary) hypertension: Secondary | ICD-10-CM | POA: Diagnosis not present

## 2024-05-04 DIAGNOSIS — D649 Anemia, unspecified: Secondary | ICD-10-CM | POA: Diagnosis not present

## 2024-05-04 DIAGNOSIS — E610 Copper deficiency: Secondary | ICD-10-CM

## 2024-05-04 DIAGNOSIS — C7A092 Malignant carcinoid tumor of the stomach: Secondary | ICD-10-CM | POA: Insufficient documentation

## 2024-05-04 DIAGNOSIS — C61 Malignant neoplasm of prostate: Secondary | ICD-10-CM | POA: Insufficient documentation

## 2024-05-04 NOTE — Patient Instructions (Addendum)
 [facility] at [facility] **VISIT SUMMARY & IMPORTANT INSTRUCTIONS **   You were seen today by Pleasant Barefoot PA-C for your follow-up visit.    PLAN SUMMARY:  >> WHOLE BODY X-RAYS to be done TODAY >> 24-hour urine study >> Labs in 3 months  >> OFFICE visit in 3 months (1 week after labs)    NEUROENDOCRINE CARCINOID TUMOR OF THE STOMACH: You had low-grade cancerous tumor in your stomach in 2015 and 2017. Your most recent EGD (March 2025) did not show any evidence of recurrent cancer in your stomach. We will continue to monitor you due to your risk of recurrent cancer.  We will check these labs and see you for office visit at least once every 3-6 months.  ANEMIA & THROMBOCYTOPENIA Your red blood cells and platelets are lower than normal. This may be due to low copper  and low folic acid  levels. CONTINUE taking FOLATE ACID 1 mg daily + COPPER  4 mg daily. We will recheck these labs in 3 months.  MGUS (monoclonal gammopathy of undetermined significance) As we discussed, this is a precancerous condition where you have slightly increased plasma cells making a slightly increased amount of abnormal immunoglobulin proteins. Although MGUS is not causing any current problems in your body, it has a 1% each year risk of progression to multiple myeloma cancer. We will check WHOLE BODY X-rays to make sure you do not have any spots on your bones caused by this abnormal protein. We need to check 24 HOUR URINE STUDY. Kit has been provided for you. FIRST MORNING: Discard first urine of the morning into the toilet. Collect the rest of your urine in the orange jug for the next 24 hours. SECOND MORNING: Collect your first urine of the morning in the jug.  This ends your 24-hour urine collection. **Store the urine jug in the refrigerator but is not being used. Return urine jug to fourth floor front desk as soon as it is completed.   FOLLOW-UP APPOINTMENT: 3 months  ** Thank  you for trusting me with your healthcare!  I strive to provide all of my patients with quality care at each visit.  If you receive a survey for this visit, I would be so grateful to you for taking the time to provide feedback.  Thank you in advance!  ~ Alexandrea Westergard                                        Dr. Mickiel Davonna Pleasant Barefoot, PA-C       Delon Hope, NP   - - - - - - - - - - - - - - - - - -     Thank you for choosing [facility] at [facility] to provide your oncology and hematology care.  To afford each patient quality time with our provider, please arrive at least 15 minutes before your scheduled appointment time.   If you have a lab appointment with the Cancer Center please come in thru the Main Entrance and check in at the main information desk.  You need to re-schedule your appointment should you arrive 10 or more minutes late.  We strive to give you quality time with our providers, and arriving late affects you and other patients whose appointments are after yours.  Also, if you no show three or  more times for appointments you may be dismissed from the clinic at the providers discretion.     Again, thank you for choosing [facility].  Our hope is that these requests will decrease the amount of time that you wait before being seen by our physicians.       _____________________________________________________________  Should you have questions after your visit to [facility], please contact our office at [phone] and follow the prompts.  Our office hours are 8:00 a.m. and 4:30 p.m. Monday - Friday.  Please note that voicemails left after 4:00 p.m. may not be returned until the following business day.  We are closed weekends and major holidays.  You do have access to a nurse 24-7, just call the main number to the clinic [phone] and do not press any options, hold on the line and a nurse will answer the phone.    For  prescription refill requests, have your pharmacy contact our office and allow 72 hours.

## 2024-05-08 ENCOUNTER — Ambulatory Visit: Admitting: Physician Assistant

## 2024-05-09 ENCOUNTER — Other Ambulatory Visit (HOSPITAL_COMMUNITY): Payer: Self-pay

## 2024-05-09 DIAGNOSIS — C7A8 Other malignant neuroendocrine tumors: Secondary | ICD-10-CM

## 2024-05-09 DIAGNOSIS — C7A092 Malignant carcinoid tumor of the stomach: Secondary | ICD-10-CM | POA: Diagnosis not present

## 2024-05-09 DIAGNOSIS — R778 Other specified abnormalities of plasma proteins: Secondary | ICD-10-CM

## 2024-05-11 LAB — 5 HIAA, QUANTITATIVE, URINE, 24 HOUR
5-HIAA, Ur: 6.4 mg/L
5-HIAA,Quant.,24 Hr Urine: 6.4 mg/(24.h) (ref 0.0–14.9)
Total Volume: 1000

## 2024-05-12 LAB — UPEP/UIFE/LIGHT CHAINS/TP, 24-HR UR
% BETA, Urine: 19.3 %
ALPHA 1 URINE: 3.3 %
Albumin, U: 25.7 %
Alpha 2, Urine: 7.1 %
Free Kappa Lt Chains,Ur: 128.07 mg/L — ABNORMAL HIGH (ref 1.17–86.46)
Free Kappa/Lambda Ratio: 4.72 (ref 1.83–14.26)
Free Lambda Lt Chains,Ur: 27.16 mg/L — ABNORMAL HIGH (ref 0.27–15.21)
GAMMA GLOBULIN URINE: 44.6 %
Total Protein, Urine-Ur/day: 96 mg/(24.h) (ref 30–150)
Total Protein, Urine: 9.6 mg/dL
Total Volume: 1000

## 2024-05-18 ENCOUNTER — Ambulatory Visit: Payer: Self-pay | Admitting: Physician Assistant

## 2024-05-18 DIAGNOSIS — R936 Abnormal findings on diagnostic imaging of limbs: Secondary | ICD-10-CM

## 2024-05-18 NOTE — Progress Notes (Signed)
 Reviewed results of skeletal survey with supervising physician (Dr. Davonna). Patient has cortically based lucent lesion measuring 7 mm in the proximal left radius.  Per radiology report, this is nonspecific and could represent a benign lesion such as a bone cyst or fibrous dysplasia. We will hold off on PET scan, since patient does not have definite MGUS - although he had positive M spike, serum immunofixation showed polyclonal gammopathy.  We will check MRI of left upper extremity for better characterization of lesion.  [name], PA-C 05/18/24 2:08 PM

## 2024-05-22 ENCOUNTER — Emergency Department (HOSPITAL_COMMUNITY)

## 2024-05-22 ENCOUNTER — Other Ambulatory Visit: Payer: Self-pay

## 2024-05-22 ENCOUNTER — Encounter (HOSPITAL_COMMUNITY): Payer: Self-pay | Admitting: Emergency Medicine

## 2024-05-22 ENCOUNTER — Emergency Department (HOSPITAL_COMMUNITY)
Admission: EM | Admit: 2024-05-22 | Discharge: 2024-05-22 | Disposition: A | Discharge status: Home / Self Care | Attending: Emergency Medicine | Admitting: Emergency Medicine

## 2024-05-22 DIAGNOSIS — M79671 Pain in right foot: Secondary | ICD-10-CM | POA: Diagnosis not present

## 2024-05-22 DIAGNOSIS — R600 Localized edema: Secondary | ICD-10-CM | POA: Insufficient documentation

## 2024-05-22 DIAGNOSIS — M79672 Pain in left foot: Secondary | ICD-10-CM | POA: Insufficient documentation

## 2024-05-22 DIAGNOSIS — D696 Thrombocytopenia, unspecified: Secondary | ICD-10-CM

## 2024-05-22 DIAGNOSIS — I10 Essential (primary) hypertension: Secondary | ICD-10-CM | POA: Insufficient documentation

## 2024-05-22 DIAGNOSIS — E8809 Other disorders of plasma-protein metabolism, not elsewhere classified: Secondary | ICD-10-CM

## 2024-05-22 DIAGNOSIS — D649 Anemia, unspecified: Secondary | ICD-10-CM

## 2024-05-22 LAB — CBC WITH DIFFERENTIAL/PLATELET
Abs Immature Granulocytes: 0.01 K/uL (ref 0.00–0.07)
Basophils Absolute: 0 K/uL (ref 0.0–0.1)
Basophils Relative: 1 %
Eosinophils Absolute: 0.1 K/uL (ref 0.0–0.5)
Eosinophils Relative: 4 %
HCT: 27.9 % — ABNORMAL LOW (ref 39.0–52.0)
Hemoglobin: 9.3 g/dL — ABNORMAL LOW (ref 13.0–17.0)
Immature Granulocytes: 0 %
Lymphocytes Relative: 29 %
Lymphs Abs: 1.2 K/uL (ref 0.7–4.0)
MCH: 32.1 pg (ref 26.0–34.0)
MCHC: 33.3 g/dL (ref 30.0–36.0)
MCV: 96.2 fL (ref 80.0–100.0)
Monocytes Absolute: 0.6 K/uL (ref 0.1–1.0)
Monocytes Relative: 14 %
Neutro Abs: 2.2 K/uL (ref 1.7–7.7)
Neutrophils Relative %: 52 %
Platelets: 122 K/uL — ABNORMAL LOW (ref 150–400)
RBC: 2.9 MIL/uL — ABNORMAL LOW (ref 4.22–5.81)
RDW: 14.5 % (ref 11.5–15.5)
WBC: 4 K/uL (ref 4.0–10.5)
nRBC: 0 % (ref 0.0–0.2)

## 2024-05-22 LAB — COMPREHENSIVE METABOLIC PANEL WITH GFR
ALT: 32 U/L (ref 0–44)
AST: 50 U/L — ABNORMAL HIGH (ref 15–41)
Albumin: 2.6 g/dL — ABNORMAL LOW (ref 3.5–5.0)
Alkaline Phosphatase: 88 U/L (ref 38–126)
Anion gap: 7 (ref 5–15)
BUN: 15 mg/dL (ref 8–23)
CO2: 22 mmol/L (ref 22–32)
Calcium: 8.4 mg/dL — ABNORMAL LOW (ref 8.9–10.3)
Chloride: 108 mmol/L (ref 98–111)
Creatinine, Ser: 1.31 mg/dL — ABNORMAL HIGH (ref 0.61–1.24)
GFR, Estimated: 57 mL/min — ABNORMAL LOW (ref >60)
Glucose, Bld: 84 mg/dL (ref 70–99)
Potassium: 3.7 mmol/L (ref 3.5–5.1)
Sodium: 137 mmol/L (ref 135–145)
Total Bilirubin: 0.8 mg/dL (ref 0.0–1.2)
Total Protein: 6.3 g/dL — ABNORMAL LOW (ref 6.5–8.1)

## 2024-05-22 LAB — BRAIN NATRIURETIC PEPTIDE: B Natriuretic Peptide: 115 pg/mL — ABNORMAL HIGH (ref 0.0–100.0)

## 2024-05-22 MED ORDER — FUROSEMIDE 40 MG PO TABS
20.0000 mg | ORAL_TABLET | Freq: Once | ORAL | Status: AC
  Administered 2024-05-22: 20 mg via ORAL
  Filled 2024-05-22: qty 1

## 2024-05-22 NOTE — ED Notes (Signed)
 [name] called to pick up pt to take home.

## 2024-05-22 NOTE — ED Provider Notes (Signed)
 [place] EMERGENCY DEPARTMENT AT [facility] Provider Note   CSN: [id number] Arrival date & time: 05/22/24  [id number]     Patient presents with: Peripheral Edema   [name] is a 73 y.o. male.   The history is provided by the patient.   He has history of hypertension, GERD, carcinoid tumor, gout and comes in complaining of swelling in both of his legs which started yesterday.  He is complaining of pain in his feet but not in his calves.  He denies any shortness of breath and denies any chest pain.    Prior to Admission medications   Medication Sig Start Date End Date Taking? Authorizing Provider  amLODipine  (NORVASC ) 10 MG tablet Take 10 mg by mouth daily. 09/09/22   [provider]  atorvastatin  (LIPITOR) 10 MG tablet Take 10 mg by mouth daily. 12/16/23   [provider]  Cholecalciferol (VITAMIN D -3 PO) Take 2,000 Units by mouth daily.    [provider]  colchicine  0.6 MG tablet TAKE 1 TABLET BY MOUTH 3 TIMES DAILY FOR 5 DAYS FOR GOUT PAIN. 02/28/24   Margrette Taft BRAVO, MD  Copper  Gluconate 2 MG TABS Take 2 tablets (4 mg total) by mouth daily. 12/06/23   Lamon Pleasant HERO, PA-C  cyanocobalamin  (VITAMIN B12) 1000 MCG tablet Take 1,000 mcg by mouth daily.    [provider]  febuxostat  (ULORIC ) 40 MG tablet Take 40 mg by mouth daily. 03/22/24   [provider]  folic acid  (FOLVITE ) 1 MG tablet Take 1 tablet (1 mg total) by mouth daily. 12/06/23   Lamon Pleasant HERO, PA-C  HYDROcodone -acetaminophen  (NORCO) 7.5-325 MG tablet One tablet every six hours as needed for pain. 04/26/24   Brenna Lin, MD  levothyroxine  (SYNTHROID ) 50 MCG tablet Take 50 mcg by mouth every morning. 09/09/22   [provider]  levothyroxine  (SYNTHROID , LEVOTHROID) 200 MCG tablet Take 1 tablet (200 mcg total) by mouth daily before breakfast. 09/24/18   Pearlean Manus, MD  metoprolol  succinate (TOPROL -XL) 25 MG 24 hr tablet Take 25 mg by mouth at  bedtime.    [provider]  Misc Natural Products (GLUCOSAMINE-CHONDROITIN SULF PO) Take 1 capsule by mouth daily. 1500mg /1200mg     [provider]  pantoprazole  (PROTONIX ) 40 MG tablet Take 1 tablet (40 mg total) by mouth 2 (two) times daily. 11/29/23 11/28/24  Cindie Carlin POUR, DO  potassium chloride  (MICRO-K ) 10 MEQ CR capsule Take 10 mEq by mouth daily. 09/16/23   [provider]  sildenafil  (VIAGRA ) 100 MG tablet TAKE ONE-HALF TABLET BY MOUTH EVERY DAY AS NEEDED 03/22/24   [provider]  tamsulosin  (FLOMAX ) 0.4 MG CAPS capsule Take 1 capsule (0.4 mg total) by mouth daily. 02/25/24   McKenzie, Belvie CROME, MD    Allergies: Aspirin    Review of Systems  All other systems reviewed and are negative.   Updated Vital Signs BP 116/73 (BP Location: Left Arm)   Pulse 91   Temp 98.3 F (36.8 C) (Oral)   Resp 18   SpO2 100%   Physical Exam Vitals and nursing note reviewed.   73 year old male, resting comfortably and in no acute distress. Vital signs are normal. Oxygen  saturation is 100%, which is normal. Head is normocephalic and atraumatic. PERRLA, EOMI.  Neck is nontender and supple without adenopathy. Back is nontender.  There is no presacral edema. Lungs are clear without rales, wheezes, or rhonchi. Chest is nontender. Heart has regular rate and rhythm without murmur.  Abdomen is soft, flat, nontender. Extremities: There is 2-3+ peripheral edema present in both legs, but left leg is significantly more than the right.  Left calf circumference is 2 cm greater than right calf circumference.  There is no palpable cord, no calf tenderness.  Toula' sign is negative. Skin is warm and dry without rash. Neurologic: Mental status is normal, cranial nerves are intact, moves all extremities equally.  (all labs ordered are listed, but only abnormal results are displayed) Labs Reviewed - No data to display  EKG: None  Radiology: No results  found.   Procedures   Medications Ordered in the ED - No data to display                                  Medical Decision Making Amount and/or Complexity of Data Reviewed Labs: ordered. Radiology: ordered.   Peripheral edema.  This a presentation with a wide range of treatment options and carries with it a high risk of morbidity and complications.  Differential diagnosis includes, but is not limited to, heart failure, hepatic failure, renal failure, DVT, lymphedema.  I have reviewed his past records, and although the patient states he has not had edema before now, cardiology office visit on 04/14/2024 describes 1-2+ ankle and lower leg edema.  I have ordered chest x-ray, electrocardiogram, screening labs including BNP.  I have ordered venous ultrasound to rule out DVT.  Chest x-ray shows no acute cardiopulmonary process.  I have independently viewed the image, and agree with the radiologist's interpretation.  I have reviewed his laboratory tests, my interpretation is stable renal insufficiency, stable mild elevation of AST, hypoalbuminemia which certainly could be contributing to his peripheral edema, stable thrombocytopenia, anemia with hemoglobin having dropped compared with 8//2025 but similar to 12/30/2023.  BNP is pending.  Case is signed out to Dr. Bernard, Oncoming physician to evaluate venous ultrasound.     Final diagnoses:  Peripheral edema    ED Discharge Orders     None          Raford Lenis, MD 05/22/24 [phone]

## 2024-05-22 NOTE — ED Notes (Signed)
 ED Provider at bedside.

## 2024-05-22 NOTE — Discharge Instructions (Addendum)
 It was our pleasure to provide your ER care today - we hope that you feel better.  Elevated legs, limit salt intake, take medication as prescribed, and follow up closely with primary care doctor in the next 1-2 weeks.   Return to ER if worse, new symptoms, fevers, chest pain, increased trouble breathing, or other emergency concern.

## 2024-05-22 NOTE — ED Provider Notes (Signed)
 Signed out to d/c to home If/when vascular u/s neg for dvt.   U/s neg for dvt.   Pt is breathing comfortably, no distress or increased wob. Pulse ox 100%.   Recent note from cardiology 8/15: He does have fluctuating lower leg edema which could be related to use of Norvasc . No clear indication for standing diuretic at this point.   Pt given dose of lasix  in ED. Rec elevation legs, limit salt, heart healthy meal plan/optimize nutrition and pcp f/u.  Pt currently appears stable for ED d/c per Dr Matthias plan.     Bernard Drivers, MD 05/22/24 [id number]

## 2024-05-22 NOTE — ED Triage Notes (Addendum)
 Pt states his lower legs and feet are swollen. States swelling started yesterday and that he has been putting alcohol on them to get them to go down. Pt with hx gout.

## 2024-05-23 ENCOUNTER — Telehealth: Payer: Self-pay | Admitting: Orthopaedic Surgery

## 2024-05-23 MED ORDER — HYDROCODONE-ACETAMINOPHEN 7.5-325 MG PO TABS: ORAL_TABLET | ORAL | 0 refills | Status: DC

## 2024-05-23 NOTE — Telephone Encounter (Signed)
 DR. BRENNA   Patient wants a refill on his pain medicine    HYDROcodone -acetaminophen  (NORCO) 7.5-325 MG tablet    Pharmacy [facility]

## 2024-05-25 ENCOUNTER — Ambulatory Visit (HOSPITAL_COMMUNITY)
Admission: RE | Admit: 2024-05-25 | Discharge: 2024-05-25 | Disposition: A | Discharge status: Home / Self Care | Source: Ambulatory Visit | Attending: Physician Assistant | Admitting: Physician Assistant

## 2024-05-25 DIAGNOSIS — R936 Abnormal findings on diagnostic imaging of limbs: Secondary | ICD-10-CM | POA: Diagnosis present

## 2024-05-25 MED ORDER — GADOBUTROL 1 MMOL/ML IV SOLN
9.0000 mL | Freq: Once | INTRAVENOUS | Status: AC | PRN
  Administered 2024-05-25: 9 mL via INTRAVENOUS

## 2024-05-29 ENCOUNTER — Ambulatory Visit: Payer: Self-pay | Admitting: Physician Assistant

## 2024-05-29 NOTE — Progress Notes (Signed)
 Results of MRI left forearm reviewed.  Cortically based bone lesion appears benign.  Follow-up x-ray recommended in 6 months to ensure stability.  NURSES: Please call patient to let him know that the spot on the bone near his left elbow looks benign.  If you could please also call the patient's brother [name]) with this information as well, patient's brother assists with many of patient's health conditions.

## 2024-05-29 NOTE — Progress Notes (Signed)
 Spoke with both patient and brother [name].  Verbalized understanding of results and plan.

## 2024-05-29 NOTE — Progress Notes (Signed)
 Called patient. No answer, left message for patient to call back.

## 2024-06-19 ENCOUNTER — Telehealth: Payer: Self-pay

## 2024-06-19 NOTE — Telephone Encounter (Signed)
 Error

## 2024-06-30 ENCOUNTER — Ambulatory Visit: Admitting: Orthopedic Surgery

## 2024-06-30 DIAGNOSIS — G8929 Other chronic pain: Secondary | ICD-10-CM | POA: Insufficient documentation

## 2024-06-30 DIAGNOSIS — G894 Chronic pain syndrome: Secondary | ICD-10-CM

## 2024-06-30 MED ORDER — HYDROCODONE-ACETAMINOPHEN 7.5-325 MG PO TABS: ORAL_TABLET | ORAL | 0 refills | Status: DC

## 2024-06-30 NOTE — Progress Notes (Signed)
   Encounter Diagnosis  Name Primary?   Chronic pain disorder Yes    Dr. Brenna has retired and [name] is here for follow-up last seen September 2025 scheduled for 37-month follow-up for chronic pain  As far as the gout goes his hands show no swelling tenderness or warmth he has a large cyst over the dorsal lateral portion of his right hand this may be a lipoma or ganglion cyst it is nontender  As far as his legs go no tenderness in either leg from hip to foot  I refilled his medication he will need 18-month follow-ups  He does not have good travel circumstances so he will be a patient that we will keep in house for his chronic pain management  Meds ordered this encounter  Medications   HYDROcodone -acetaminophen  (NORCO) 7.5-325 MG tablet    Sig: One tablet every six hours as needed for pain.    Dispense:  50 tablet    Refill:  0

## 2024-06-30 NOTE — Progress Notes (Signed)
    06/30/2024   Chief Complaint  Patient presents with   Follow-up    Gout on both hand/feet per pt. States he is out of pain meds for one month.    Encounter Diagnosis  Name Primary?   Chronic pain disorder Yes    What pharmacy do you use ? [facility]__________  DOI/DOS/ Date: 06/30/24  Did you get better, worse or no change (Answer below)   Unchanged

## 2024-07-12 ENCOUNTER — Telehealth: Payer: Self-pay | Admitting: Gastroenterology

## 2024-07-12 ENCOUNTER — Ambulatory Visit (INDEPENDENT_AMBULATORY_CARE_PROVIDER_SITE_OTHER): Admitting: Gastroenterology

## 2024-07-12 ENCOUNTER — Encounter: Payer: Self-pay | Admitting: Gastroenterology

## 2024-07-12 VITALS — BP 132/81 | HR 76 | Temp 97.7°F | Ht 75.0 in | Wt 188.6 lb

## 2024-07-12 DIAGNOSIS — K259 Gastric ulcer, unspecified as acute or chronic, without hemorrhage or perforation: Secondary | ICD-10-CM

## 2024-07-12 DIAGNOSIS — K760 Fatty (change of) liver, not elsewhere classified: Secondary | ICD-10-CM

## 2024-07-12 DIAGNOSIS — R7401 Elevation of levels of liver transaminase levels: Secondary | ICD-10-CM | POA: Diagnosis not present

## 2024-07-12 DIAGNOSIS — K219 Gastro-esophageal reflux disease without esophagitis: Secondary | ICD-10-CM | POA: Diagnosis not present

## 2024-07-12 DIAGNOSIS — D696 Thrombocytopenia, unspecified: Secondary | ICD-10-CM

## 2024-07-12 DIAGNOSIS — D649 Anemia, unspecified: Secondary | ICD-10-CM | POA: Insufficient documentation

## 2024-07-12 NOTE — Telephone Encounter (Signed)
 Opened in error

## 2024-07-12 NOTE — Patient Instructions (Signed)
 You are due a colonoscopy at any time for history of colon polyps and you will be due upper endoscopy in march 2026 to follow up on stomach ulcers and changes in stomach lining. We will hold off right now, but will see you back in 3 months and discuss again at that time.   You liver shows fattiness and possible advanced fibrosis given nodular borders. We could update ultrasound test to determine if worsening liver disease. Will hold off right now per your request. Continue labs with your other providers.

## 2024-07-12 NOTE — Progress Notes (Addendum)
 GI Office Note    Referring Provider: Vick Lurie, FNP Primary Care Physician:  McCorkle, Tenika, FNP (Inactive)  Primary Gastroenterologist: Carlin POUR. Cindie, DO   Chief Complaint   Chief Complaint  Patient presents with   Follow-up    Doing well, no issues    History of Present Illness   Kenner Lewan is a 73 y.o. male presenting today for follow up. Last seen 12/2023. H/o neuroendocrine carcinoid tumor of stomach.   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 Based on pathology, there was concern for residual disease or more extensive diseaese within the stomach.  Octreotide  scan on 09/19/2014 was negative for any avid metastases. EGD with gastric biopsy on 03/12/2015 was negative for dysplasia or malignancy. EGD with polypectomy by Dr. Harvey on 10/08/2015: Gastric polyp with well-differentiated neuroendocrine tumor. His case was presented at GI tumor board in Feb/March 2016 with recommendation for anatomic biopsies to guide role of surgical resection.  Anatomic biopsies throughout stomach on 04/21/2016 were negative. Chromogranin A elevated since January 2020 (186.0).  Gradually trending upward with most recent chromogranin A elevated at 215.5. (Chromogranin A was within normal limits from June 2016 through January 2019). Following elevated chromogranin levels in January 2020, Dr. Harvey was aware of elevated levels and advised EGD after having seen patient in consult.   Patient refused at that time.  He does not currently have any signs or symptoms of carcinoid syndrome. Per Dr. Katragadda, chromogranin A can be falsely elevated in patients on PPI, however patient is not taking any PPI or other acid blocking medication. Per oncology, ideally, patient will agree to EGD.  Otherwise, could consider checking 24-hour urine 5 HIAA, or checking dotatate PET scan if significantly increased chromogranin A or symptomatic.      H/o chronic  anemia, since 2020. Thrombocytopenia since 2010. CT 04/2019 with no evidence of cirrhosis. Recent labs with Hgb 10.2, MCV 99.4. ferritin 478, iron  36, cre 1.17. Work up by hematology with recent labs.  Folate 4.2, copper  58, B12 1261, serum protein electrophoresis with elevated beta globulin, elevated M spike, IgA 1894, immunofixation with polyclonal increase detected in 1 or more immunoglobulins, elevated kappa free light chain and lambda free light chains.  Chronically has elevated AST less than 2 times normal.  Follow-up with Pleasant Barefoot in April.  Discussed the use of AI scribe software for clinical note transcription with the patient, who gave verbal consent to proceed.  History of Present Illness Blaike Vickers is a 73 year old male with a history of fatty liver, anemia, thrombocytopenia, gastric ulcers, history of colon polyps, and neuroendocrine tumor who presents for follow-up.  He has experienced slight weight loss since his last visit in May, with his weight down about 3 pounds (compared to same scales) but overall fluctuating between 188 and 195 pounds over the past few months. His appetite has been improving. No stomach pain, bloody or black stools, heartburn, indigestion, or swallowing problems. He takes iron  medication, which causes his stools to darken, and his energy levels are normal. No shortness of breath, chest pain, lightheadedness.  He has a history of fatty liver, and has had previous ultrasounds. He does not consume alcohol regularly like in the past. Reports drinking 1-2 drinks at a time but only occasionally and sometimes going a month without alcohol.   He expresses reluctance about undergoing EGD in 10/2024 for one year follow up. Same with colonoscopy, overdue at this time. He gets  tired of testing and really does not want anything at this time given he is 73.      Wt Readings from Last 10 Encounters:  07/12/24 188 lb 9.6 oz (85.5 kg)  05/22/24 195 lb (88.5 kg)   05/04/24 187 lb 2.7 oz (84.9 kg)  04/14/24 188 lb (85.3 kg)  01/12/24 193 lb (87.5 kg)  01/10/24 191 lb 9.6 oz (86.9 kg)  12/30/23 187 lb 6.3 oz (85 kg)  12/06/23 187 lb 3.2 oz (84.9 kg)  11/29/23 198 lb 13.7 oz (90.2 kg)  11/25/23 199 lb (90.3 kg)    Prior Data   Labs 05/22/24: White blood cell count 4000, hemoglobin 9.3, MCV 96.2, platelets 122, sodium 137, potassium 3.7, BUN 15, creatinine 1.31, albumin 2.6, AST 50, ALT 32, alk phos 88, total bilirubin 0.8, BNP 115, 5 HIAA 6.4  Labs August 2025: Iron  70, TIBC 197, iron  sats 36, ferritin 452  Labs 12/2023: Hep B surface antibody negative, hepatitis A total antibody negative, hepatitis B core total antibody negative, hep C antibody negative, hepatitis B surface antigen negative, total bilirubin 0.4, alk phos 127, AST 57, ALT 35  Labs from April 2025: Glucose 96, creatinine 1.25, potassium 3.8, sodium 138, albumin 3.3, total bilirubin 0.6, alkaline phosphatase 111, AST 86, ALT 60, A1c 4.8, total cholesterol 72, white blood cell count 5900, hemoglobin 10.7, MCV 98, platelets 135,000, TSH 0.962.  See below for other labs.   Per PA Pennington's note: DISCUSSION WITH MD (Dr. Rogers) on 12/07/2023: Reviewed patient history and pathology from most recent EGD.  Per Dr. Rogers, no further imaging or EGD (for oncology surveillance), unless significant lab changes, symptoms, or other indication.  Will continue to monitor labs    Ruq u/s 12/2023:  IMPRESSION: Mildly heterogeneous hepatic echogenicity with lobular liver contours. This is nonspecific but can be seen in the setting of underlying hepatocellular disease.   Abd u/s 10/2023: IMPRESSION: 1. No acute abnormality identified. 2. Increased echotexture of the liver. This is a nonspecific finding but can be seen in fatty infiltration of liver.   EGD 10/2023: -gastritis s/p bx -three gastric polyps resected -single gastric polyp resected -single gastric plyp resected -nonbleeding  gastric ulcers -normal duodenal bulb, first portion of duodenum and second portion of duodenum -pathology with hyperplastic polyps and gastric bx showed significant inflammation and gastric intestinal metaplasia, no malignancy. Take PPI BID due to ulcers.   EGD 03/2016: -gastric carcinoid - nodular mucosa in cardia, in the gastric fundus, gastric body. Nodules persist but unable to appreciate additional lesions -metal clips remain in stomach from 10/2015 -biopsies showed gastritis, no h.pylori, no carcinoid   Colonoscopy 12/2017: -severe diverticulosis -tortuous left colon -internal hemorrhoids -repeat colonoscopy in five years, due to history of advanced colon polyps in 2016   Medications   Current Outpatient Medications  Medication Sig Dispense Refill   amLODipine  (NORVASC ) 10 MG tablet Take 10 mg by mouth daily.     atorvastatin  (LIPITOR) 10 MG tablet Take 10 mg by mouth daily.     Boswellia-Glucosamine-Vit D (GLUCOSAMINE COMPLEX/VITAMIN D3) TABS Take 1 tablet by mouth daily.     Cholecalciferol (VITAMIN D -3 PO) Take 2,000 Units by mouth daily.     colchicine  0.6 MG tablet TAKE 1 TABLET BY MOUTH 3 TIMES DAILY FOR 5 DAYS FOR GOUT PAIN. 15 tablet 3   cyanocobalamin  (VITAMIN B12) 1000 MCG tablet Take 1,000 mcg by mouth daily.     febuxostat  (ULORIC ) 40 MG tablet Take 40 mg by mouth daily.  ferrous sulfate 325 (65 FE) MG tablet Take 325 mg by mouth daily with breakfast.     folic acid  (FOLVITE ) 400 MCG tablet Take 400 mcg by mouth daily.     levothyroxine  (SYNTHROID , LEVOTHROID) 200 MCG tablet Take 1 tablet (200 mcg total) by mouth daily before breakfast. 90 tablet 3   metoprolol  succinate (TOPROL -XL) 25 MG 24 hr tablet Take 25 mg by mouth at bedtime.     potassium chloride  (MICRO-K ) 10 MEQ CR capsule Take 10 mEq by mouth daily.     tamsulosin  (FLOMAX ) 0.4 MG CAPS capsule Take 1 capsule (0.4 mg total) by mouth daily. 90 capsule 3   No current facility-administered medications for this  visit.    Allergies   Allergies as of 07/12/2024 - Review Complete 07/12/2024  Allergen Reaction Noted   Aspirin Other (See Comments) 06/01/2007     Review of Systems   General: Negative for anorexia, weight loss, fever, chills, fatigue, weakness. ENT: Negative for hoarseness, difficulty swallowing , nasal congestion. CV: Negative for chest pain, angina, palpitations, dyspnea on exertion, peripheral edema.  Respiratory: Negative for dyspnea at rest, dyspnea on exertion, cough, sputum, wheezing.  GI: See history of present illness. GU:  Negative for dysuria, hematuria, urinary incontinence, urinary frequency, nocturnal urination.  Endo: Negative for unusual weight change.     Physical Exam   BP 132/81   Pulse 76   Temp 97.7 F (36.5 C) (Oral)   Ht 6' 3 (1.905 m)   Wt 188 lb 9.6 oz (85.5 kg)   SpO2 100%   BMI 23.57 kg/m    General: Well-nourished, well-developed in no acute distress.  Eyes: No icterus. Mouth: Oropharyngeal mucosa moist and pink   Lungs: Clear to auscultation bilaterally.  Heart: Regular rate and rhythm, no murmurs rubs or gallops.  Abdomen: Bowel sounds are normal, nontender, nondistended, no hepatosplenomegaly or masses,  no abdominal bruits or hernia , no rebound or guarding.  Rectal: not performed Extremities: No lower extremity edema. No clubbing or deformities. Neuro: Alert and oriented x 4   Skin: Warm and dry, no jaundice.   Psych: Alert and cooperative, normal mood and affect.  Labs   See above  Imaging Studies   No results found.  Assessment/Plan:        Neuroendocrine carcinoid tumor of the stomach: -original diagnosis in 2015, see extensive details above -oncology requesting updated EGD earlier this year, completed as outlined above.  -per Dr. Rogers, no surveillance EGDs needed unless significant lab changes or symptoms.   Gastric ulcers/gastric intestinal metaplasia: noted on EGD, March 2025 -pantoprazole  started after  EGD, 40mg  BID, appears he took for 2 months and stopped after that   -plans for surveillance EGD 10/2024 due to intestinal metaplasia, if patient agrees -return ov in 3 months   Elevated AST/fatty liver: -chronically mildly elevated, query etoh related -complete u/s in March with normal spleen, fatty liver -ruq u/s by PCP in 12/2023 with lobular contours -encouraged etoh cessation -previously recommended Hep A/B vaccinations -consider updated imaging when patient agreeable -continue to follow labs as obtained by other providers per patient request   Normocytic anemia/thrombocytopenia: -mild intermittent anemia since at least 2020 -mild to moderate intermittent thrombocytopenia since 2010 -last liver imaging by CT 2020, no overt cirrhosis, normal sized spleen -Abd u/s 10/2023 with normal spleen, fatty liver -RUQ u/s 12/2023 with lobular border -no IDA -low folate 4.1, improved now on supplement -B12 1261 -low copper , patient on supplement per hematology -trend downward of Hgb,  denies rectal bleeding or melena. He is overdue for a colonoscopy but he declines. Plans for EGD in 10/2024, may decline -?advanced fibrosis or early cirrhosis, possibly contributing to his pancytopenia/anemia -followed closely by hematology, suspected anemia of chronic disease/chronic inflammation, also with abnormal SPEP with M spike with polyclonal increase. Surveillance for now but may have bone marrow biopsy pending next labs.   History of colon polyps: overdue for surveillance, last colonoscopy 2019, patient declines at this time. Reassess at next ov.   Sonny RAMAN. Ezzard, MHS, PA-C [facility]

## 2024-07-26 ENCOUNTER — Telehealth: Payer: Self-pay

## 2024-07-26 MED ORDER — HYDROCODONE-ACETAMINOPHEN 7.5-325 MG PO TABS: 1.0000 | ORAL_TABLET | Freq: Four times a day (QID) | ORAL | 0 refills | Status: DC | PRN

## 2024-07-26 NOTE — Telephone Encounter (Signed)
 Dr. Margery pt-----Hydrocodone -Acetaminophen  7.5/325 MG  Qty 50 Tablets  One tablet every six hours as needed for pain.   PATIENT USES [facility]

## 2024-07-31 ENCOUNTER — Ambulatory Visit: Admitting: Orthopedic Surgery

## 2024-08-19 ENCOUNTER — Other Ambulatory Visit: Payer: Self-pay

## 2024-08-19 ENCOUNTER — Emergency Department (HOSPITAL_COMMUNITY)

## 2024-08-19 ENCOUNTER — Emergency Department (HOSPITAL_COMMUNITY)
Admission: EM | Admit: 2024-08-19 | Discharge: 2024-08-19 | Disposition: A | Discharge status: Home / Self Care | Attending: Emergency Medicine | Admitting: Emergency Medicine

## 2024-08-19 DIAGNOSIS — Z8546 Personal history of malignant neoplasm of prostate: Secondary | ICD-10-CM | POA: Diagnosis not present

## 2024-08-19 DIAGNOSIS — N3091 Cystitis, unspecified with hematuria: Secondary | ICD-10-CM | POA: Diagnosis not present

## 2024-08-19 DIAGNOSIS — R319 Hematuria, unspecified: Secondary | ICD-10-CM | POA: Diagnosis present

## 2024-08-19 DIAGNOSIS — E039 Hypothyroidism, unspecified: Secondary | ICD-10-CM | POA: Diagnosis not present

## 2024-08-19 DIAGNOSIS — I10 Essential (primary) hypertension: Secondary | ICD-10-CM | POA: Diagnosis not present

## 2024-08-19 DIAGNOSIS — Z79899 Other long term (current) drug therapy: Secondary | ICD-10-CM | POA: Diagnosis not present

## 2024-08-19 LAB — BASIC METABOLIC PANEL WITH GFR
Anion gap: 8 (ref 5–15)
BUN: 16 mg/dL (ref 8–23)
CO2: 29 mmol/L (ref 22–32)
Calcium: 9.5 mg/dL (ref 8.9–10.3)
Chloride: 100 mmol/L (ref 98–111)
Creatinine, Ser: 1.12 mg/dL (ref 0.61–1.24)
GFR, Estimated: >60 mL/min
Glucose, Bld: 99 mg/dL (ref 70–99)
Potassium: 4.3 mmol/L (ref 3.5–5.1)
Sodium: 136 mmol/L (ref 135–145)

## 2024-08-19 LAB — CBC
HCT: 34.8 % — ABNORMAL LOW (ref 39.0–52.0)
Hemoglobin: 11.3 g/dL — ABNORMAL LOW (ref 13.0–17.0)
MCH: 32.4 pg (ref 26.0–34.0)
MCHC: 32.5 g/dL (ref 30.0–36.0)
MCV: 99.7 fL (ref 80.0–100.0)
Platelets: 111 K/uL — ABNORMAL LOW (ref 150–400)
RBC: 3.49 MIL/uL — ABNORMAL LOW (ref 4.22–5.81)
RDW: 14 % (ref 11.5–15.5)
WBC: 4.3 K/uL (ref 4.0–10.5)
nRBC: 0 % (ref 0.0–0.2)

## 2024-08-19 LAB — URINALYSIS, ROUTINE W REFLEX MICROSCOPIC
Bilirubin Urine: NEGATIVE
Glucose, UA: NEGATIVE mg/dL
Ketones, ur: NEGATIVE mg/dL
Leukocytes,Ua: NEGATIVE
Nitrite: NEGATIVE
Protein, ur: 30 mg/dL — AB
RBC / HPF: >50 RBC/hpf (ref 0–5)
Specific Gravity, Urine: 1.004 — ABNORMAL LOW (ref 1.005–1.030)
WBC, UA: >50 WBC/hpf (ref 0–5)
pH: 7 (ref 5.0–8.0)

## 2024-08-19 LAB — PSA: Prostatic Specific Antigen: 0.34 ng/mL (ref 0.00–4.00)

## 2024-08-19 MED ORDER — IOHEXOL 300 MG/ML  SOLN
100.0000 mL | Freq: Once | INTRAMUSCULAR | Status: AC | PRN
  Administered 2024-08-19: 100 mL via INTRAVENOUS

## 2024-08-19 MED ORDER — SODIUM CHLORIDE 0.9 % IV BOLUS
500.0000 mL | Freq: Once | INTRAVENOUS | Status: AC
  Administered 2024-08-19: 500 mL via INTRAVENOUS

## 2024-08-19 MED ORDER — CEPHALEXIN 500 MG PO CAPS: 500.0000 mg | ORAL_CAPSULE | Freq: Three times a day (TID) | ORAL | 0 refills | Status: AC

## 2024-08-19 MED ORDER — SODIUM CHLORIDE 0.9 % IV SOLN
1.0000 g | Freq: Once | INTRAVENOUS | Status: AC
  Administered 2024-08-19: 1 g via INTRAVENOUS
  Filled 2024-08-19: qty 10

## 2024-08-19 NOTE — ED Triage Notes (Signed)
 Pt arrived to ED via POV. He claims that as of this morning he started peeing and noticed there was blood in it. Pt was able to provide a sample and urine appeared light red in color. Pt denies pain, n/v/d. Pt states he's had this happen in the past.

## 2024-08-19 NOTE — ED Provider Notes (Signed)
 " [place] EMERGENCY DEPARTMENT AT [facility] ([facility]) Provider Note   CSN: [id number] Arrival date & time: 08/19/24  9344     Patient presents with: Hematuria   [name] is a 73 y.o. male.   Pt is a 73 yo male with pmhx significant for prostate cancer s/p radiation, gout, hx etoh abuse, carcinoid tumor of the stomach, GERD, lower GIB, HTN, hypothyroidism, fatty liver, B12 deficiency, and hx SVT.  Pt is not on blood thinners.  He presents to the ED today with blood in urine.  He denies any pain.  He noticed it this am when he first woke up.  Urine continues to be bloody.       Prior to Admission medications  Medication Sig Start Date End Date Taking? Authorizing Provider  cephALEXin  (KEFLEX ) 500 MG capsule Take 1 capsule (500 mg total) by mouth 3 (three) times daily. 08/19/24  Yes Dean Clarity, MD  amLODipine  (NORVASC ) 10 MG tablet Take 10 mg by mouth daily. 09/09/22   [provider]  atorvastatin  (LIPITOR) 10 MG tablet Take 10 mg by mouth daily. 12/16/23   [provider]  Boswellia-Glucosamine-Vit D (GLUCOSAMINE COMPLEX/VITAMIN D3) TABS Take 1 tablet by mouth daily.    [provider]  Cholecalciferol (VITAMIN D -3 PO) Take 2,000 Units by mouth daily.    [provider]  colchicine  0.6 MG tablet TAKE 1 TABLET BY MOUTH 3 TIMES DAILY FOR 5 DAYS FOR GOUT PAIN. 02/28/24   Margrette Taft BRAVO, MD  cyanocobalamin  (VITAMIN B12) 1000 MCG tablet Take 1,000 mcg by mouth daily.    [provider]  febuxostat  (ULORIC ) 40 MG tablet Take 40 mg by mouth daily. 03/22/24   [provider]  ferrous sulfate 325 (65 FE) MG tablet Take 325 mg by mouth daily with breakfast.    [provider]  folic acid  (FOLVITE ) 400 MCG tablet Take 400 mcg by mouth daily. 06/29/24   [provider]  HYDROcodone -acetaminophen  (NORCO) 7.5-325 MG tablet Take 1 tablet by mouth every 6 (six) hours as needed for moderate pain (pain score 4-6).  07/26/24   Onesimo Oneil LABOR, MD  levothyroxine  (SYNTHROID , LEVOTHROID) 200 MCG tablet Take 1 tablet (200 mcg total) by mouth daily before breakfast. 09/24/18   Pearlean Manus, MD  metoprolol  succinate (TOPROL -XL) 25 MG 24 hr tablet Take 25 mg by mouth at bedtime.    [provider]  potassium chloride  (MICRO-K ) 10 MEQ CR capsule Take 10 mEq by mouth daily. 09/16/23   [provider]  tamsulosin  (FLOMAX ) 0.4 MG CAPS capsule Take 1 capsule (0.4 mg total) by mouth daily. 02/25/24   McKenzie, Belvie CROME, MD    Allergies: Aspirin    Review of Systems  Genitourinary:  Positive for hematuria.  All other systems reviewed and are negative.   Updated Vital Signs BP (!) 155/96   Pulse 77   Temp 98.1 F (36.7 C) (Oral)   Resp 16   Ht 6' 3 (1.905 m)   Wt 90.7 kg   SpO2 100%   BMI 25.00 kg/m   Physical Exam Vitals and nursing note reviewed.  Constitutional:      Appearance: Normal appearance.  HENT:     Head: Normocephalic and atraumatic.     Right Ear: External ear normal.     Left Ear: External ear normal.     Nose: Nose normal.     Mouth/Throat:     Mouth: Mucous membranes are moist.  Eyes:  Extraocular Movements: Extraocular movements intact.     Conjunctiva/sclera: Conjunctivae normal.     Pupils: Pupils are equal, round, and reactive to light.  Cardiovascular:     Rate and Rhythm: Normal rate and regular rhythm.     Pulses: Normal pulses.     Heart sounds: Normal heart sounds.  Pulmonary:     Effort: Pulmonary effort is normal.     Breath sounds: Normal breath sounds.  Abdominal:     General: Abdomen is flat. Bowel sounds are normal.     Palpations: Abdomen is soft.  Musculoskeletal:        General: Normal range of motion.     Cervical back: Normal range of motion and neck supple.  Skin:    General: Skin is warm.     Capillary Refill: Capillary refill takes less than 2 seconds.  Neurological:     General: No focal deficit present.     Mental  Status: He is alert and oriented to person, place, and time.  Psychiatric:        Mood and Affect: Mood normal.        Behavior: Behavior normal.     (all labs ordered are listed, but only abnormal results are displayed) Labs Reviewed  URINALYSIS, ROUTINE W REFLEX MICROSCOPIC - Abnormal; Notable for the following components:      Result Value   Color, Urine RED (*)    APPearance HAZY (*)    Specific Gravity, Urine 1.004 (*)    Hgb urine dipstick MODERATE (*)    Protein, ur 30 (*)    Bacteria, UA RARE (*)    All other components within normal limits  CBC - Abnormal; Notable for the following components:   RBC 3.49 (*)    Hemoglobin 11.3 (*)    HCT 34.8 (*)    Platelets 111 (*)    All other components within normal limits  URINE CULTURE  BASIC METABOLIC PANEL WITH GFR  PSA    EKG: None  Radiology: CT ABDOMEN PELVIS W CONTRAST Result Date: 08/19/2024 EXAM: CT ABDOMEN AND PELVIS WITH CONTRAST 08/19/2024 08:58:54 AM TECHNIQUE: CT of the abdomen and pelvis was performed with the administration of 100 mL of iohexol  (OMNIPAQUE ) 300 MG/ML solution. Multiplanar reformatted images are provided for review. Automated exposure control, iterative reconstruction, and/or weight-based adjustment of the mA/kV was utilized to reduce the radiation dose to as low as reasonably achievable. COMPARISON: 04/24/2019 CLINICAL HISTORY: Hematuria, gross/macroscopic. FINDINGS: LOWER CHEST: No acute abnormality. LIVER: The liver is unremarkable. GALLBLADDER AND BILE DUCTS: Cholecystectomy. Mild intrahepatic biliary ductal dilation, likely related to the prior cholecystectomy. SPLEEN: No acute abnormality. PANCREAS: No acute abnormality. ADRENAL GLANDS: No acute abnormality. KIDNEYS, URETERS AND BLADDER: Subcentimeter hypodensity in the lower pole of the left kidney, too small to definitively characterize, likely a small cyst. Per consensus, no follow-up is needed for simple Bosniak type 1 and 2 renal cysts,  unless the patient has a malignancy history or risk factors. No stones in the kidneys or ureters. No hydronephrosis. No perinephric or periureteral stranding. Mild circumferential wall thickening of the urinary bladder, which may be due to underdistention or acute cystitis. GI AND BOWEL: Stomach demonstrates no acute abnormality. Extensive, total colonic diverticulosis, most severe within the sigmoid colon. Mild wall thickening of the rectum with perirectal and presacral stranding. There is no bowel obstruction. PERITONEUM AND RETROPERITONEUM: No ascites. No free air. No free pelvic fluid. VASCULATURE: Aorta is normal in caliber. LYMPH NODES: No lymphadenopathy. REPRODUCTIVE ORGANS: Prostatomegaly  with fiducial markers present. BONES AND SOFT TISSUES: Multilevel degenerative disc disease of the thoracolumbar spine with thoracic dish. Moderate bilateral hip osteoarthritis. Similar appearing intramuscular lipoma in the right lateral abdominal wall musculature. No acute osseous abnormality. IMPRESSION: 1. Mild circumferential wall thickening of the urinary bladder, possibly due to underdistention or acute cystitis. Correlation with urinalysis recommended for further characterization. 2. Mild wall thickening of the rectum with perirectal and presacral stranding. This may represent an infectious or inflammatory proctitis, possibly post-radiation proctitis. No free pelvic fluid. Electronically signed by: Rogelia Myers MD 08/19/2024 09:15 AM EST RP Workstation: [id number]     Procedures   Medications Ordered in the ED  sodium chloride  0.9 % bolus 500 mL (500 mLs Intravenous New Bag/Given 08/19/24 0738)  cefTRIAXone  (ROCEPHIN ) 1 g in sodium chloride  0.9 % 100 mL IVPB (1 g Intravenous New Bag/Given 08/19/24 0851)  iohexol  (OMNIPAQUE ) 300 MG/ML solution 100 mL (100 mLs Intravenous Contrast Given 08/19/24 0847)                                    Medical Decision Making Amount and/or Complexity of Data  Reviewed Labs: ordered. Radiology: ordered.  Risk Prescription drug management.   This patient presents to the ED for concern of hematuria, this involves an extensive number of treatment options, and is a complaint that carries with it a high risk of complications and morbidity.  The differential diagnosis includes uti, kidney stone, bladder cancer   Co morbidities that complicate the patient evaluation  prostate cancer s/p radiation, gout, hx etoh abuse, carcinoid tumor of the stomach, GERD, lower GIB, HTN, hypothyroidism, fatty liver, B12 deficiency, and hx SVT   Additional history obtained:  Additional history obtained from epic chart review    Lab Tests:  I Ordered, and personally interpreted labs.  The pertinent results include:  cbc with hgb 11.3 (9.3 in September); UA with UTI; bmp nl   Imaging Studies ordered:  I ordered imaging studies including ct abd/pelvis  I independently visualized and interpreted imaging which showed   Mild circumferential wall thickening of the urinary bladder, possibly due to  underdistention or acute cystitis. Correlation with urinalysis recommended for  further characterization.  2. Mild wall thickening of the rectum with perirectal and presacral stranding.  This may represent an infectious or inflammatory proctitis, possibly  post-radiation proctitis. No free pelvic fluid.   I agree with the radiologist interpretation  Medicines ordered and prescription drug management:  I ordered medication including rocephin /ivfs  for sx  Reevaluation of the patient after these medicines showed that the patient improved I have reviewed the patients home medicines and have made adjustments as needed   Test Considered:  ct   Critical Interventions:  abx  Problem List / ED Course:  Hemorrhagic cystitis:  pt given a dose of rocephin  in the ED.  He is d/c with keflex .  Urine sent for cx.  He is stable for d/c.  Return if worse.  F/u with  pcp.   Reevaluation:  After the interventions noted above, I reevaluated the patient and found that they have :improved   Social Determinants of Health:  Lives at home   Dispostion:  After consideration of the diagnostic results and the patients response to treatment, I feel that the patent would benefit from discharge with outpatient f/u.       Final diagnoses:  Hemorrhagic cystitis    ED Discharge Orders  Ordered    cephALEXin  (KEFLEX ) 500 MG capsule  3 times daily        08/19/24 0946               Dean Clarity, MD 08/19/24 [phone]  "

## 2024-08-19 NOTE — Discharge Instructions (Addendum)
 Start antibiotics tomorrow.  You had your dose today via the IV.

## 2024-08-20 LAB — URINE CULTURE: Culture: NO GROWTH

## 2024-08-21 ENCOUNTER — Encounter (HOSPITAL_COMMUNITY): Payer: Self-pay | Admitting: Emergency Medicine

## 2024-08-21 ENCOUNTER — Emergency Department (HOSPITAL_COMMUNITY)
Admission: EM | Admit: 2024-08-21 | Discharge: 2024-08-21 | Disposition: A | Discharge status: Home / Self Care | Attending: Emergency Medicine | Admitting: Emergency Medicine

## 2024-08-21 ENCOUNTER — Other Ambulatory Visit: Payer: Self-pay

## 2024-08-21 ENCOUNTER — Telehealth: Payer: Self-pay

## 2024-08-21 DIAGNOSIS — Z79899 Other long term (current) drug therapy: Secondary | ICD-10-CM | POA: Insufficient documentation

## 2024-08-21 DIAGNOSIS — I10 Essential (primary) hypertension: Secondary | ICD-10-CM | POA: Diagnosis not present

## 2024-08-21 DIAGNOSIS — S0121XA Laceration without foreign body of nose, initial encounter: Secondary | ICD-10-CM | POA: Diagnosis not present

## 2024-08-21 DIAGNOSIS — S0992XA Unspecified injury of nose, initial encounter: Secondary | ICD-10-CM | POA: Diagnosis present

## 2024-08-21 DIAGNOSIS — X58XXXA Exposure to other specified factors, initial encounter: Secondary | ICD-10-CM | POA: Diagnosis not present

## 2024-08-21 DIAGNOSIS — R040 Epistaxis: Secondary | ICD-10-CM

## 2024-08-21 MED ORDER — LIDOCAINE-EPINEPHRINE (PF) 1 %-1:200000 IJ SOLN
20.0000 mL | Freq: Once | INTRAMUSCULAR | Status: AC
  Administered 2024-08-21: 20 mL
  Filled 2024-08-21: qty 30

## 2024-08-21 MED ORDER — SILVER NITRATE-POT NITRATE 75-25 % EX MISC
CUTANEOUS | Status: AC
  Filled 2024-08-21: qty 10

## 2024-08-21 NOTE — Telephone Encounter (Signed)
 Pt's Aide Rico called to make our office aware pt will have labs done at the cancer center. Lab appointment cancel and voiced understanding.

## 2024-08-21 NOTE — ED Provider Notes (Signed)
 " [place] EMERGENCY DEPARTMENT AT [facility] Provider Note   CSN: [id number] Arrival date & time: 08/21/24  9479     Patient presents with: Epistaxis   [name] is a 73 y.o. male.   The history is provided by the patient.  Epistaxis  He has history of hypertension, gout, alcohol abuse, carcinoid tumor of the stomach, GERD, PSVT comes in because of nosebleed which started at 4 AM.  He denies any trauma and denies picking at his nose.  He has not had problems with nosebleeds before.    Prior to Admission medications  Medication Sig Start Date End Date Taking? Authorizing Provider  amLODipine  (NORVASC ) 10 MG tablet Take 10 mg by mouth daily. 09/09/22   [provider]  atorvastatin  (LIPITOR) 10 MG tablet Take 10 mg by mouth daily. 12/16/23   [provider]  Boswellia-Glucosamine-Vit D (GLUCOSAMINE COMPLEX/VITAMIN D3) TABS Take 1 tablet by mouth daily.    [provider]  cephALEXin  (KEFLEX ) 500 MG capsule Take 1 capsule (500 mg total) by mouth 3 (three) times daily. 08/19/24   Haviland, Julie, MD  Cholecalciferol (VITAMIN D -3 PO) Take 2,000 Units by mouth daily.    [provider]  colchicine  0.6 MG tablet TAKE 1 TABLET BY MOUTH 3 TIMES DAILY FOR 5 DAYS FOR GOUT PAIN. 02/28/24   Margrette Taft BRAVO, MD  cyanocobalamin  (VITAMIN B12) 1000 MCG tablet Take 1,000 mcg by mouth daily.    [provider]  febuxostat  (ULORIC ) 40 MG tablet Take 40 mg by mouth daily. 03/22/24   [provider]  ferrous sulfate 325 (65 FE) MG tablet Take 325 mg by mouth daily with breakfast.    [provider]  folic acid  (FOLVITE ) 400 MCG tablet Take 400 mcg by mouth daily. 06/29/24   [provider]  HYDROcodone -acetaminophen  (NORCO) 7.5-325 MG tablet Take 1 tablet by mouth every 6 (six) hours as needed for moderate pain (pain score 4-6). 07/26/24   Onesimo Oneil LABOR, MD  levothyroxine  (SYNTHROID , LEVOTHROID) 200 MCG tablet Take 1  tablet (200 mcg total) by mouth daily before breakfast. 09/24/18   Pearlean Manus, MD  metoprolol  succinate (TOPROL -XL) 25 MG 24 hr tablet Take 25 mg by mouth at bedtime.    [provider]  potassium chloride  (MICRO-K ) 10 MEQ CR capsule Take 10 mEq by mouth daily. 09/16/23   [provider]  tamsulosin  (FLOMAX ) 0.4 MG CAPS capsule Take 1 capsule (0.4 mg total) by mouth daily. 02/25/24   McKenzie, Belvie CROME, MD    Allergies: Aspirin    Review of Systems  HENT:  Positive for nosebleeds.   All other systems reviewed and are negative.   Updated Vital Signs BP (!) 146/93   Pulse 68   Temp 98.8 F (37.1 C) (Oral)   Resp 19   Ht 6' 3 (1.905 m)   Wt 90 kg   SpO2 99%   BMI 24.80 kg/m   Physical Exam Vitals and nursing note reviewed.  HENT:     Head:      Comments: Bleeding site  73 year old male, resting comfortably and in no acute distress. Vital signs are significant for mildly elevated blood pressure. Oxygen  saturation is 99%, which is normal. Head is normocephalic and atraumatic. PERRLA, EOMI. there is a punctate area of bleeding that is from the skin of the columella of the nose.  There is not any bleeding coming from within the nose. Lungs are clear without rales, wheezes, or rhonchi.  Heart has regular rate and rhythm without murmur. Neurologic: Awake and alert, moves all extremities equally.    .Laceration Repair  Date/Time: 08/21/2024 7:39 AM  Performed by: Raford Lenis, MD Authorized by: Raford Lenis, MD   Consent:    Consent obtained:  Verbal   Consent given by:  Patient   Risks, benefits, and alternatives were discussed: yes     Risks discussed:  Infection and pain   Alternatives discussed: Alternative treatment. Universal protocol:    Procedure explained and questions answered to patient or proxy's satisfaction: yes     Relevant documents present and verified: yes     Required blood products, implants, devices, and special equipment  available: yes     Site/side marked: yes     Immediately prior to procedure, a time out was called: yes     Patient identity confirmed:  Verbally with patient and arm band Anesthesia:    Anesthesia method:  Local infiltration   Local anesthetic:  Lidocaine  1% WITH epi Laceration details:    Location:  Face (This is not actually a laceration, but a bleeding site from the columella of the nose)   Face location:  Nose   Length (cm):  0.2   Depth (mm):  1 Pre-procedure details:    Preparation:  Patient was prepped and draped in usual sterile fashion Exploration:    Limited defect created (wound extended): no     Hemostasis achieved with:  Direct pressure   Wound exploration: entire depth of wound visualized     Wound extent: no foreign body and no vascular damage     Contaminated: no   Treatment:    Area cleansed with:  Saline   Amount of cleaning:  Standard   Debridement:  None   Undermining:  None   Scar revision: no   Skin repair:    Repair method:  Sutures   Suture size:  5-0   Wound skin closure material used: Vicryl Rapide.   Suture technique:  Figure eight   Number of sutures:  1 Repair type:    Repair type:  Simple Post-procedure details:    Dressing:  Open (no dressing)   Procedure completion:  Tolerated well, no immediate complications Comments:     Following placement of suture, bleeding stopped.    Medications Ordered in the ED - No data to display                                  Medical Decision Making Risk Prescription drug management.   Bleeding site on the skin of the columella, no true epistaxis.  I am treating this with a figure-of-eight suture.  Following placement of suture, there was excellent hemostasis, no further bleeding during 1 hour of observation in the emergency department.     Final diagnoses:  Bleeding nose    ED Discharge Orders     None          Raford Lenis, MD 08/21/24 [phone]  "

## 2024-08-21 NOTE — ED Notes (Signed)
 Pt verbalized understanding of discharge instructions. Opportunity for questions provided.   Pt to wait in lobby for brother.

## 2024-08-21 NOTE — ED Triage Notes (Signed)
 Pt c/o of a nose bleed that started 0400 this morning.does not take thinners.

## 2024-08-21 NOTE — ED Notes (Signed)
 Pt's brother called for transport.

## 2024-08-21 NOTE — ED Notes (Signed)
Pt holding pressure to nose.

## 2024-08-21 NOTE — Discharge Instructions (Signed)
 Please be very careful about touching around where the bleeding site is.  The stitches should fall out in 3-5 days.  If bleeding starts back, return to the emergency department.

## 2024-08-22 ENCOUNTER — Inpatient Hospital Stay: Attending: Physician Assistant

## 2024-08-22 ENCOUNTER — Telehealth: Payer: Self-pay

## 2024-08-22 DIAGNOSIS — C61 Malignant neoplasm of prostate: Secondary | ICD-10-CM | POA: Diagnosis not present

## 2024-08-22 DIAGNOSIS — D649 Anemia, unspecified: Secondary | ICD-10-CM | POA: Diagnosis not present

## 2024-08-22 DIAGNOSIS — C7A092 Malignant carcinoid tumor of the stomach: Secondary | ICD-10-CM | POA: Diagnosis present

## 2024-08-22 DIAGNOSIS — D696 Thrombocytopenia, unspecified: Secondary | ICD-10-CM | POA: Diagnosis not present

## 2024-08-22 DIAGNOSIS — R778 Other specified abnormalities of plasma proteins: Secondary | ICD-10-CM

## 2024-08-22 DIAGNOSIS — E538 Deficiency of other specified B group vitamins: Secondary | ICD-10-CM

## 2024-08-22 DIAGNOSIS — E610 Copper deficiency: Secondary | ICD-10-CM

## 2024-08-22 DIAGNOSIS — C7A8 Other malignant neuroendocrine tumors: Secondary | ICD-10-CM

## 2024-08-22 LAB — COMPREHENSIVE METABOLIC PANEL WITH GFR
ALT: 27 U/L (ref 0–44)
AST: 50 U/L — ABNORMAL HIGH (ref 15–41)
Albumin: 4.2 g/dL (ref 3.5–5.0)
Alkaline Phosphatase: 87 U/L (ref 38–126)
Anion gap: 14 (ref 5–15)
BUN: 17 mg/dL (ref 8–23)
CO2: 23 mmol/L (ref 22–32)
Calcium: 10 mg/dL (ref 8.9–10.3)
Chloride: 103 mmol/L (ref 98–111)
Creatinine, Ser: 1.21 mg/dL (ref 0.61–1.24)
GFR, Estimated: >60 mL/min
Glucose, Bld: 110 mg/dL — ABNORMAL HIGH (ref 70–99)
Potassium: 4 mmol/L (ref 3.5–5.1)
Sodium: 139 mmol/L (ref 135–145)
Total Bilirubin: 0.7 mg/dL (ref 0.0–1.2)
Total Protein: 8.7 g/dL — ABNORMAL HIGH (ref 6.5–8.1)

## 2024-08-22 LAB — CBC WITH DIFFERENTIAL/PLATELET
Abs Immature Granulocytes: 0.01 K/uL (ref 0.00–0.07)
Basophils Absolute: 0 K/uL (ref 0.0–0.1)
Basophils Relative: 1 %
Eosinophils Absolute: 0.3 K/uL (ref 0.0–0.5)
Eosinophils Relative: 7 %
HCT: 36.4 % — ABNORMAL LOW (ref 39.0–52.0)
Hemoglobin: 11.9 g/dL — ABNORMAL LOW (ref 13.0–17.0)
Immature Granulocytes: 0 %
Lymphocytes Relative: 44 %
Lymphs Abs: 2 K/uL (ref 0.7–4.0)
MCH: 32.5 pg (ref 26.0–34.0)
MCHC: 32.7 g/dL (ref 30.0–36.0)
MCV: 99.5 fL (ref 80.0–100.0)
Monocytes Absolute: 0.4 K/uL (ref 0.1–1.0)
Monocytes Relative: 9 %
Neutro Abs: 1.7 K/uL (ref 1.7–7.7)
Neutrophils Relative %: 39 %
Platelets: 124 K/uL — ABNORMAL LOW (ref 150–400)
RBC: 3.66 MIL/uL — ABNORMAL LOW (ref 4.22–5.81)
RDW: 13.8 % (ref 11.5–15.5)
WBC: 4.3 K/uL (ref 4.0–10.5)
nRBC: 0 % (ref 0.0–0.2)

## 2024-08-22 LAB — FOLATE: Folate: 12.5 ng/mL

## 2024-08-22 LAB — IRON AND TIBC
Iron: 95 ug/dL (ref 45–182)
Saturation Ratios: 30 % (ref 17.9–39.5)
TIBC: 316 ug/dL (ref 250–450)
UIBC: 221 ug/dL

## 2024-08-22 LAB — FERRITIN: Ferritin: 377 ng/mL — ABNORMAL HIGH (ref 24–336)

## 2024-08-22 LAB — LACTATE DEHYDROGENASE: LDH: 167 U/L (ref 105–235)

## 2024-08-22 NOTE — Telephone Encounter (Signed)
 Dr. Margrette patient//Hydrocodone -Acetaminophen  7.5/325 MG  Qty 50 Tablets  Take 1 tablet by mouth every 6 (six) hours as needed for moderate pain (pain score 4-6)  PATIENT USES [facility]

## 2024-08-23 LAB — KAPPA/LAMBDA LIGHT CHAINS
Kappa free light chain: 113.1 mg/L — ABNORMAL HIGH (ref 3.3–19.4)
Kappa, lambda light chain ratio: 1.22 (ref 0.26–1.65)
Lambda free light chains: 93 mg/L — ABNORMAL HIGH (ref 5.7–26.3)

## 2024-08-23 LAB — CHROMOGRANIN A: Chromogranin A (ng/mL): 185.7 ng/mL — ABNORMAL HIGH (ref 0.0–101.8)

## 2024-08-23 MED ORDER — HYDROCODONE-ACETAMINOPHEN 7.5-325 MG PO TABS: 1.0000 | ORAL_TABLET | Freq: Four times a day (QID) | ORAL | 0 refills | Status: DC | PRN

## 2024-08-24 LAB — BETA 2 MICROGLOBULIN, SERUM: Beta-2 Microglobulin: 4 mg/L — ABNORMAL HIGH (ref 0.6–2.4)

## 2024-08-25 ENCOUNTER — Other Ambulatory Visit

## 2024-08-25 LAB — COPPER, SERUM: Copper: 83 ug/dL (ref 69–132)

## 2024-08-25 LAB — PROTEIN ELECTROPHORESIS, SERUM
A/G Ratio: 0.7 (ref 0.7–1.7)
Albumin ELP: 3.5 g/dL (ref 2.9–4.4)
Alpha-1-Globulin: 0.3 g/dL (ref 0.0–0.4)
Alpha-2-Globulin: 0.7 g/dL (ref 0.4–1.0)
Beta Globulin: 1.9 g/dL — ABNORMAL HIGH (ref 0.7–1.3)
Gamma Globulin: 1.9 g/dL — ABNORMAL HIGH (ref 0.4–1.8)
Globulin, Total: 4.7 g/dL — ABNORMAL HIGH (ref 2.2–3.9)
M-Spike, %: 0.6 g/dL — ABNORMAL HIGH
Total Protein ELP: 8.2 g/dL (ref 6.0–8.5)

## 2024-08-27 LAB — IMMUNOFIXATION ELECTROPHORESIS
IgA: 2221 mg/dL — ABNORMAL HIGH (ref 61–437)
IgG (Immunoglobin G), Serum: 1143 mg/dL (ref 603–1613)
IgM (Immunoglobulin M), Srm: 32 mg/dL (ref 15–143)
Total Protein ELP: 7.8 g/dL (ref 6.0–8.5)

## 2024-08-28 LAB — MISC LABCORP TEST (SEND OUT): LabCorp test name: 121251

## 2024-08-28 NOTE — Progress Notes (Unsigned)
 "  [facility] [address], [place] [address]   CLINIC:  Medical Oncology/Hematology  PCP:  Vick Lurie, FNP (Inactive) [address] [place] [address] [phone]  REASON FOR VISIT:  Follow-up for neuroendocrine carcinoid tumor  CURRENT THERAPY: Surveillance  INTERVAL HISTORY:   [name] 73 y.o. male returns for routine follow-up of neuroendocrine carcinoid tumor.   He was last seen by Philip Barefoot PA-C on 05/04/2024.  At today's visit, he reports feeling well. He has 100% energy and 100% appetite. He endorses that he is maintaining a stable weight.  He denies any nausea/vomiting, abdominal pain, flushing, wheezing, heart palpations, constipation, diarrhea.    He denies any rectal bleeding or melena. He had some hematuria from a UTI last week, but has not had any recurrence after starting antibiotics. No abnormal bruising. He denies any B symptoms such as fever, chills, night sweats, unintentional weight loss. He does not have any peripheral numbness or tingling. He reports that he drinks about 2-3 beers on the weekends. He is taking copper  4 mg daily and folic acid  1 mg daily.   ASSESSMENT & PLAN:  1.  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 Harvey on 08/14/2014: Gastric polyp showed low-grade neuroendocrine tumor.  Stomach biopsy showed focal involvement by low-grade neuroendocrine tumor. - Based on pathology from 08/14/2014, there was concern of residual disease or more extensive disease within his stomach.  Octreotide  scan on 09/19/2014 was negative for any avid metastases. - EGD with gastric biopsy on 03/12/2015 was negative for dysplasia or malignancy. - EGD with polypectomy by Dr. Harvey on 10/08/2015: Gastric polyp with well-differentiated neuroendocrine tumor  - His case was presented at GI tumor board in Feb/March 2016 with recommendation for anatomic biopsies to  guide role of surgical resection.  Anatomic biopsies throughout stomach on 04/21/2016 were negative.  - Most recent EGD (11/29/2023, with Dr. Cindie): Gastritis, multiple gastric polyps (including some with bleeding), and nonbleeding gastric ulcers with friable tissue.  Polyp pathology revealed hyperplastic gastric polyps with erosion, inflammation, and reactive changes (negative for neuroendocrine tumor, adenomatous change, and malignancy).  Biopsy of stomach showed chronic gastritis (stomach antrum) and atrophic gastritis with diffuse intestinal metaplasia and glandular atrophy (stomach biopsy).  - 24-hour urine 5-HIAA (05/09/2024) was 6.4 mg /24 hours (normal) - He does not currently have any signs or symptoms of carcinoid syndrome. - Most recent labs (08/22/2024): Chromogranin A elevated but stable at 185.7 (most recent EGD/biopsy was negative for any neuroendocrine tumor). LFTs with mildly elevated AST at 50, stable (followed by GI, Sonny Kerns, PA-C) - No longer on PPI since November 2025 - CT abdomen/pelvis with contrast (done in the ED on 08/19/2024): No concerns for malignancy noted. - PLAN: Repeat LFTs + chromogranin A at follow-up in 6 months - Repeat 24-hour urine 5-HIAA annually, next due September 2026. - If symptomatic or significantly elevated chromogranin A, consider checking 24-hour urine 5-HIAA and/or dotatate-PET - Continue follow-up with gastroenterology.  Considering EGD in March 2026, but patient may decline. NCCN Guidelines for Surveillance of Well-Differentiated Grade 1/2 Neuroendocrine Tumors of Gastrointestinal Tract, Lung, and Thymus: - Within first year after resection, check H&P, biochemical markers, and CT A/P every 3 months. - After 1 year post resection to 10 years, check H&P, biochemical markers (as appropriate), and CT abdomen/pelvis every 12 to 24 months. - After 10 years, consider surveillance as clinically indicated.    **DISCUSSION WITH MD (Dr.  Katragadda) on  12/07/2023: Reviewed patient history and pathology from most recent EGD.  Per Dr. Rogers, no further imaging or EGD (for oncology surveillance), unless significant lab changes, symptoms, or other indication.  Will continue to monitor labs.    2.  Normocytic anemia + thrombocytopenia - Mild intermittent anemia since at least 2020.  Mild to moderate intermittent thrombocytopenia since at least 2010, with episode of severe thrombocytopenia during hospitalization in 2008. - Most recent EGD (11/29/2023, with Dr. Cindie): Gastritis, multiple gastric polyps (including some with bleeding), and nonbleeding gastric ulcers with friable tissue.  Polyp pathology revealed hyperplastic gastric polyps with erosion, inflammation, and reactive changes (negative for neuroendocrine tumor, adenomatous change, and malignancy).  Biopsy of stomach showed chronic gastritis (stomach antrum) and atrophic gastritis with diffuse intestinal metaplasia and glandular atrophy (stomach biopsy). - CT abdomen/pelvis (04/24/2019): No focal liver abnormality.  Normal-sized spleen. - US  abdomen (11/18/2023): Possible fatty liver infiltration.  Normal spleen. - Per gastroenterology Kathye Kerns, PA-C), patient with history of fatty liver, ?advanced fibrosis or early cirrhosis? - Hematology workup: Normal iron  stores (February 2025) with ferritin 478/iron  saturation 36% Creatinine 1.17/GFR >60.  Normal LDH. Low folate (4.1).  Low copper  (58).  Normal B12 at 1004, normal MMA.   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  Normal reticulocytes. ANA negative.  Rheumatoid factor positive (15.4).  Normal CRP, elevated ESR 56. - Most recent labs (08/22/2024):  Hgb 11.9/MCV 99.5.  Platelets 124.  WBC 4.3/normal differential. Ferritin 377, iron  saturation 30%, TIBC normal Normal folate 12.5.  Normal copper  83. Creatinine 1.21, Cystatin C 1.74 = GFR 47 (CKD stage IIIa) - He denies any  rectal bleeding or melena. - No abnormal bruising.  He denies any B symptoms such as fever, chills, night sweats, unintentional weight loss. - He is taking folic acid  1 mg daily + copper  4 mg daily - He reports that he drinks about 2-3 beers on the weekends. - DIFFERENTIAL DIAGNOSIS: Suspect anemia of chronic disease/chronic inflammation, anemia of CKD stage IIIa.  He is at risk for nutritional deficiencies due to alcohol consumption.  He also had evidence of friable tissue with risk of bleeding, although his iron  levels are normal.   Could represent developing MDS. - PLAN: Hemoglobin slightly improved after starting folate and copper  supplementations. - CONTINUE folic acid  1 mg daily + copper  4 mg daily  - No indication to restart iron  pill at this time.  - RTC in 6 months with repeat CBC/D, folate, copper , ferritin, iron /TIBC, CMP, Cystatin C - If significant worsening of anemia and thrombocytopenia (Hgb <10.0 or platelets <100), would consider bone marrow biopsy.    3.  Abnormal SPEP - Workup of anemia and thrombocytopenia (11/01/2023) revealed the following: SPEP with M spike 0.6%. Serum immunofixation shows polyclonal increase in 1 or more immunoglobulins Normal FLC ratio 1.22, but elevated kappa 108.8 and elevated lambda 88.9 - 24-hour urine/UPEP (05/09/2024): No M spike.  Urine immunofixation negative.  Urine light chain ratio normal. - Skeletal survey (05/04/2024): 7 mm cortically based lucent lesion in proximal left radius, nonspecific, could represent benign lesion such as bone cyst or fibrous dysplasia. - MRI left upper extremity to characterize lucent lesion (05/25/2024): Likely benign cortically based without aggressive features or suspicious enhancement.  Follow-up x-ray recommended in 6 months. - Most recent MGUS/myeloma panel (08/22/2024):  Serum immunofixation: Polyclonal increase in immunoglobulins, with elevated IgA 2221 SPEP with M spike 0.6, but may represent benign spike Normal  FLC  ratio 1.22 (Kappa 113.1, lambda 93.0) Beta-2 microglobulin elevated at 4.0 Normal LDH 160 - Denies any B symptoms or neurologic changes.   - PLAN: Repeat left forearm x-ray due around 11/22/2024. - Will plan on rechecking MGUS/myeloma panel in 6 months.  4.  Pulmonary nodule: - 6 mm LUL pulmonary nodule on neck PET scan done on 10/18/2018. - CT chest from September 2020 revealed stable left lung nodule. - Repeat CT chest from 07/11/2020 showed interval development of multiple patchy areas of groundglass attenuation scattered throughout both lungs.  Other nonspecific these are favored to represent sequelae of inflammation or infection.  Atypical viral infection not excluded.  This is likely related to recent COVID-19 diagnosis.  The 7 mm pulmonary nodule remains stable.  Thought to be a benign perifissural lymph node.  5.  Prostate cancer: - Prostate biopsy (03/08/2023): Right lateral base Gleason 3+4=7, grade group 2, 5% of 1 core, PSA 6.0 - Placement of SpaceOAR by Dr. Sherrilee on 06/10/2023.  Completed prostatic radiation with Dr. Dannielle in [place] from 06/30/2023 through 08/10/2023 - Most recent PSA (08/19/2024) within normal limits at 0.34 - PLAN: Continue follow-up with urology and radiation oncology   PLAN SUMMARY:  >> X-ray of left forearm due around 11/22/2024 >> Labs in 6 months = CBC/D, folate, copper , ferritin, iron /TIBC, CMP, Cystatin C, SPEP, light chains, immunofixation, chromogranin A, LDH >> OFFICE visit in 6 months (1 week after labs)     REVIEW OF SYSTEMS:  Review of Systems  Constitutional:  Negative for appetite change, chills, diaphoresis, fatigue, fever and unexpected weight change.  HENT:   Negative for lump/mass and nosebleeds.   Eyes:  Negative for eye problems.  Respiratory:  Negative for cough, hemoptysis and shortness of breath.   Cardiovascular:  Negative for chest pain, leg swelling and palpitations.  Gastrointestinal:  Negative for abdominal pain, blood in  stool, constipation, diarrhea, nausea and vomiting.  Genitourinary:  Negative for hematuria.   Musculoskeletal:  Negative for arthralgias.  Skin: Negative.   Neurological:  Negative for dizziness, headaches and light-headedness.  Hematological:  Does not bruise/bleed easily.     PHYSICAL EXAM:  ECOG PERFORMANCE STATUS: 1 - Symptomatic but completely ambulatory  Vitals:   08/29/24 1030 08/29/24 1034  BP: (!) 146/92 138/86  Pulse: 60   Resp: 17   Temp: (!) 97.2 F (36.2 C)   SpO2: 100%     Filed Weights   08/29/24 1030  Weight: 188 lb 6.4 oz (85.5 kg)    Physical Exam Vitals reviewed.  Constitutional:      Appearance: Normal appearance.  Cardiovascular:     Rate and Rhythm: Normal rate and regular rhythm.     Heart sounds: Normal heart sounds.  Pulmonary:     Breath sounds: Wheezing (faint wheezing of bilateral lower lobes) present.  Neurological:     Mental Status: He is alert.  Psychiatric:        Mood and Affect: Mood normal.        Behavior: Behavior normal.    PAST MEDICAL/SURGICAL HISTORY:  Past Medical History:  Diagnosis Date   Anemia    FeDA: GASTRIC POLYPS, B12; TCS 2008 EGD 2009, 2008-HB 11.1 MCV 83.6 CR 1.22, 2009 FERRITIN 102-22   B12 deficiency    Carcinoid tumor determined by biopsy of stomach (HCC) 09/25/2014   Chronic knee pain    Diverticulosis of colon    Lower GI bleed 2008   Essential hypertension    GERD (gastroesophageal reflux disease)  Gout    Hepatomegaly    Hepatic steatosis   History of alcohol abuse    History of septic arthritis    Hypothyroidism    PSVT (paroxysmal supraventricular tachycardia)    PUD    Substance abuse (HCC)    Past Surgical History:  Procedure Laterality Date   BIOPSY  08/14/2014   Procedure: BIOPSY;  Surgeon: Margo LITTIE Haddock, MD;  Location: [facility];  Service: Endoscopy;;   BIOPSY  03/12/2015   Procedure: BIOPSY;  Surgeon: Margo LITTIE Haddock, MD;  Location: [facility];  Service: Endoscopy;;   BIOPSY   04/21/2016   Procedure: BIOPSY;  Surgeon: Margo LITTIE Haddock, MD;  [facility];  Service: Endoscopy;;  bx's of antrum, body of stomach, fundus, and cardia   CHOLECYSTECTOMY     COLONOSCOPY  2008 [facility] DJ   LGIB 2o to TICS, prep good   COLONOSCOPY WITH PROPOFOL  N/A 08/14/2014   SLF: 1. Four large colon polyps removed. 2. The left colon is redundant 3. Moderate diverticulosis throughout teh entire examined colon   COLONOSCOPY WITH PROPOFOL  N/A 11/06/2014   SLF: 8 small polyps removed. One large pedunculated polyp removed from the ascending colon, tubulovillous and tubular adenomas. Next colonoscopy March 2019   COLONOSCOPY WITH PROPOFOL  N/A 01/04/2018   Procedure: COLONOSCOPY WITH PROPOFOL ;  Surgeon: Haddock Margo LITTIE, MD;  [facility];  Service: Endoscopy;  Laterality: N/A;  10:00am   ESOPHAGEAL BANDING N/A 11/29/2023   Procedure: ESOPHAGOSCOPY, WITH ESOPHAGEAL VARICES BAND LIGATION;  Surgeon: Cindie Carlin POUR, DO;  [facility];  Service: Endoscopy;  Laterality: N/A;   ESOPHAGOGASTRODUODENOSCOPY  12/08/2007   DOQ:Qpcz gastric polyps seen in the cardia and body of the stomach/Normal esophagus without evidence of Barrett's, mass, erosion/Normal duodenal bulb and second portion of the duodenum. Benign bx.   ESOPHAGOGASTRODUODENOSCOPY N/A 11/29/2023   Procedure: EGD (ESOPHAGOGASTRODUODENOSCOPY);  Surgeon: Cindie Carlin POUR, DO;  [facility];  Service: Endoscopy;  Laterality: N/A;  2:00 pm, asa 3   ESOPHAGOGASTRODUODENOSCOPY (EGD) WITH PROPOFOL  N/A 08/14/2014   SLF: 1. Heme postive stools due to gastric and colon polyps 2. Multiple gastric    ESOPHAGOGASTRODUODENOSCOPY (EGD) WITH PROPOFOL  N/A 03/12/2015   SLF: Multiple gastric nodules seen in gasric body/antrum. 2. Non-erosive gastritis ( inflammation) was found in the gastric antrum.    ESOPHAGOGASTRODUODENOSCOPY (EGD) WITH PROPOFOL  N/A 10/08/2015   Procedure: ESOPHAGOGASTRODUODENOSCOPY (EGD) WITH PROPOFOL ;  Surgeon:  Margo LITTIE Haddock, MD;  [facility];  Service: Endoscopy;  Laterality: N/A;  0945   ESOPHAGOGASTRODUODENOSCOPY (EGD) WITH PROPOFOL  N/A 04/21/2016   Procedure: ESOPHAGOGASTRODUODENOSCOPY (EGD) WITH PROPOFOL ;  Surgeon: Margo LITTIE Haddock, MD;  [facility];  Service: Endoscopy;  Laterality: N/A;  730    GOLD SEED IMPLANT N/A 06/10/2023   Procedure: GOLD SEED IMPLANT;  Surgeon: Sherrilee Belvie LITTIE, MD;  Location: [facility];  Service: Urology;  Laterality: N/A;   HAND SURGERY Right    fracture repair with plates   HEMOSTASIS CLIP PLACEMENT  11/29/2023   Procedure: CONTROL OF HEMORRHAGE, GI TRACT, ENDOSCOPIC, BY CLIPPING OR OVERSEWING;  Surgeon: Cindie Carlin POUR, DO;  [facility];  Service: Endoscopy;;   INGUINAL HERNIA REPAIR Left 11/09/2018   Procedure: HERNIA REPAIR INGUINAL ADULT WITH MESH;  Surgeon: Kallie Manuelita BROCKS, MD;  Location: [facility];  Service: General;  Laterality: Left;   JOINT REPLACEMENT Bilateral    knees   POLYPECTOMY  08/14/2014   Procedure: POLYPECTOMY;  Surgeon: Margo LITTIE Haddock,  MD;  Location: [facility];  Service: Endoscopy;;   POLYPECTOMY N/A 11/06/2014   Procedure: POLYPECTOMY;  Surgeon: Margo LITTIE Haddock, MD;  Location: [facility];  Service: Endoscopy;  Laterality: N/A;  Transverse Colon x3, Ascending Colon x2, Descending Colon x3   POLYPECTOMY  11/29/2023   Procedure: POLYPECTOMY;  Surgeon: Cindie Carlin POUR, DO;  [facility];  Service: Endoscopy;;   REPLACEMENT TOTAL KNEE BILATERAL     SPACE OAR INSTILLATION N/A 06/10/2023   Procedure: SPACE OAR INSTILLATION;  Surgeon: Sherrilee Belvie LITTIE, MD;  Location: [facility];  Service: Urology;  Laterality: N/A;   UPPER GASTROINTESTINAL ENDOSCOPY  [facility] 2009   INFLAMED HYPERPLASTIC POLYPS, CHRONIC GASTRITIS    SOCIAL HISTORY:  Social History   Socioeconomic History   Marital status: Single    Spouse name: Not on file   Number of children: 0   Years of education: 3   Highest education level: Not on file   Occupational History   Occupation: retired    Comment: farming  Tobacco Use   Smoking status: Never    Passive exposure: Never   Smokeless tobacco: Never   Tobacco comments:    Quit x 10 years/ never smoked on regular basis  Vaping Use   Vaping status: Never Used  Substance and Sexual Activity   Alcohol use: Yes    Alcohol/week: 2.0 standard drinks of alcohol    Types: 2 Shots of liquor per week    Comment: drinks on weekends, gin/vodka 1/5th. as of 07/12/24 he reports drinking 1-2 drinks at a time but can go weeks without any etoh   Drug use: Not Currently   Sexual activity: Never  Other Topics Concern   Not on file  Social History Narrative   HE DOES NOT HAVE ANY CHILDREN.   Lives alone   Does not drive   CAN NOT READ   Social Drivers of Health   Tobacco Use: Low Risk (08/21/2024)   Patient History    Smoking Tobacco Use: Never    Smokeless Tobacco Use: Never    Passive Exposure: Never  Financial Resource Strain: Not on file  Food Insecurity: No Food Insecurity (04/13/2023)   Hunger Vital Sign    Worried About Running Out of Food in the Last Year: Never true    Ran Out of Food in the Last Year: Never true  Transportation Needs: No Transportation Needs (04/13/2023)   PRAPARE - Administrator, Civil Service (Medical): No    Lack of Transportation (Non-Medical): No  Physical Activity: Not on file  Stress: Not on file  Social Connections: Not on file  Intimate Partner Violence: Not At Risk (04/13/2023)   Humiliation, Afraid, Rape, and Kick questionnaire    Fear of Current or Ex-Partner: No    Emotionally Abused: No    Physically Abused: No    Sexually Abused: No  Depression (PHQ2-9): Low Risk (08/29/2024)   Depression (PHQ2-9)    PHQ-2 Score: 0  Alcohol Screen: Low Risk (04/13/2023)   Alcohol Screen    Last Alcohol Screening Score (AUDIT): 4  Housing: Low Risk (04/13/2023)   Housing    Last Housing Risk Score: 0  Utilities: Not At Risk (04/13/2023)    AHC Utilities    Threatened with loss of utilities: No  Health Literacy: Not on file    FAMILY HISTORY:  Family History  Problem Relation Age of Onset   Diabetes Mother    Renal Disease Mother  failure   Diabetes Father        'old age'   Hypertension Sister    Hypertension Brother    Diabetes Brother    Diabetes Sister    Diabetes Sister    Diabetes Brother    Hypertension Brother    Diabetes Brother        right BKA   Kidney failure Brother        on dialysis   Kidney failure Brother        on dialysis   Diabetes Brother        bilateral BKA   Colon polyps Neg Hx    Colon cancer Neg Hx    Pancreatic disease Neg Hx     CURRENT MEDICATIONS:  Outpatient Encounter Medications as of 08/29/2024  Medication Sig   amLODipine  (NORVASC ) 10 MG tablet Take 10 mg by mouth daily.   atorvastatin  (LIPITOR) 10 MG tablet Take 10 mg by mouth daily.   Boswellia-Glucosamine-Vit D (GLUCOSAMINE COMPLEX/VITAMIN D3) TABS Take 1 tablet by mouth daily.   cephALEXin  (KEFLEX ) 500 MG capsule Take 1 capsule (500 mg total) by mouth 3 (three) times daily.   Cholecalciferol (VITAMIN D -3 PO) Take 2,000 Units by mouth daily.   clindamycin  (CLEOCIN  T) 1 % external solution SMARTSIG:sparingly Topical Twice Daily   colchicine  0.6 MG tablet TAKE 1 TABLET BY MOUTH 3 TIMES DAILY FOR 5 DAYS FOR GOUT PAIN.   cyanocobalamin  (VITAMIN B12) 1000 MCG tablet Take 1,000 mcg by mouth daily.   febuxostat  (ULORIC ) 40 MG tablet Take 40 mg by mouth daily.   ferrous sulfate 325 (65 FE) MG tablet Take 325 mg by mouth daily with breakfast.   folic acid  (FOLVITE ) 400 MCG tablet Take 400 mcg by mouth daily.   HYDROcodone -acetaminophen  (NORCO) 7.5-325 MG tablet Take 1 tablet by mouth every 6 (six) hours as needed for moderate pain (pain score 4-6).   levothyroxine  (SYNTHROID , LEVOTHROID) 200 MCG tablet Take 1 tablet (200 mcg total) by mouth daily before breakfast.   metoprolol  succinate (TOPROL -XL) 25 MG 24 hr tablet  Take 25 mg by mouth at bedtime.   potassium chloride  (MICRO-K ) 10 MEQ CR capsule Take 10 mEq by mouth daily.   tamsulosin  (FLOMAX ) 0.4 MG CAPS capsule Take 1 capsule (0.4 mg total) by mouth daily.   No facility-administered encounter medications on file as of 08/29/2024.    ALLERGIES:  Allergies  Allergen Reactions   Aspirin Other (See Comments)    Diverticular Bleed    LABORATORY DATA:  I have reviewed the labs as listed.  CBC    Component Value Date/Time   WBC 4.3 08/22/2024 1003   RBC 3.66 (L) 08/22/2024 1003   HGB 11.9 (L) 08/22/2024 1003   HGB 10.2 (L) 01/14/2024 0906   HCT 36.4 (L) 08/22/2024 1003   HCT 31.1 (L) 01/14/2024 0906   HCT 44 01/05/2013 0000   PLT 124 (L) 08/22/2024 1003   PLT 131 (L) 01/14/2024 0906   MCV 99.5 08/22/2024 1003   MCV 100 (H) 01/14/2024 0906   MCH 32.5 08/22/2024 1003   MCHC 32.7 08/22/2024 1003   RDW 13.8 08/22/2024 1003   RDW 13.3 01/14/2024 0906   LYMPHSABS 2.0 08/22/2024 1003   LYMPHSABS 2.1 01/14/2024 0906   MONOABS 0.4 08/22/2024 1003   EOSABS 0.3 08/22/2024 1003   EOSABS 0.2 01/14/2024 0906   BASOSABS 0.0 08/22/2024 1003   BASOSABS 0.0 01/14/2024 0906      Latest Ref Rng & Units 08/22/2024   10:03  AM 08/19/2024    7:25 AM 05/22/2024    6:06 AM  CMP  Glucose 70 - 99 mg/dL 889  99  84   BUN 8 - 23 mg/dL 17  16  15    Creatinine 0.61 - 1.24 mg/dL 8.78  8.87  8.68   Sodium 135 - 145 mmol/L 139  136  137   Potassium 3.5 - 5.1 mmol/L 4.0  4.3  3.7   Chloride 98 - 111 mmol/L 103  100  108   CO2 22 - 32 mmol/L 23  29  22    Calcium  8.9 - 10.3 mg/dL 89.9  9.5  8.4   Total Protein 6.5 - 8.1 g/dL 8.7   6.3   Total Bilirubin 0.0 - 1.2 mg/dL 0.7   0.8   Alkaline Phos 38 - 126 U/L 87   88   AST 15 - 41 U/L 50   50   ALT 0 - 44 U/L 27   32     DIAGNOSTIC IMAGING:  I have independently reviewed the relevant imaging and discussed with the patient.   WRAP UP:  All questions were answered. The patient knows to call the clinic with  any problems, questions or concerns.  Medical decision making: Moderate  Time spent on visit: I spent 20 minutes counseling the patient face to face. The total time spent in the appointment was 30 minutes and more than 50% was on counseling.  Philip CHRISTELLA Barefoot, PA-C  08/29/2024 11:55 AM  "

## 2024-08-29 ENCOUNTER — Inpatient Hospital Stay (HOSPITAL_BASED_OUTPATIENT_CLINIC_OR_DEPARTMENT_OTHER): Admitting: Physician Assistant

## 2024-08-29 VITALS — BP 138/86 | HR 60 | Temp 97.2°F | Resp 17 | Ht 75.0 in | Wt 188.4 lb

## 2024-08-29 DIAGNOSIS — R936 Abnormal findings on diagnostic imaging of limbs: Secondary | ICD-10-CM

## 2024-08-29 DIAGNOSIS — D649 Anemia, unspecified: Secondary | ICD-10-CM

## 2024-08-29 DIAGNOSIS — C7A8 Other malignant neuroendocrine tumors: Secondary | ICD-10-CM

## 2024-08-29 DIAGNOSIS — R778 Other specified abnormalities of plasma proteins: Secondary | ICD-10-CM | POA: Diagnosis not present

## 2024-08-29 DIAGNOSIS — D696 Thrombocytopenia, unspecified: Secondary | ICD-10-CM

## 2024-08-29 DIAGNOSIS — C7A092 Malignant carcinoid tumor of the stomach: Secondary | ICD-10-CM | POA: Diagnosis not present

## 2024-08-29 NOTE — Patient Instructions (Signed)
 [facility] at [facility] **VISIT SUMMARY & IMPORTANT INSTRUCTIONS **   You were seen today by Pleasant Barefoot PA-C for your follow-up visit.    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 Your most recent EGD (March 2025) did not show any evidence of recurrent cancer in your stomach. We will continue to monitor you due to your risk of recurrent cancer.  We will check these labs and see you for office visit in 6 months.  ANEMIA & THROMBOCYTOPENIA Your red blood cells and platelets are lower than normal. This may be due to low copper  and low folic acid  levels. You also have evidence of early chronic kidney disease, which can cause some mild anemia. CONTINUE taking FOLATE ACID 1 mg daily + COPPER  4 mg daily. We will recheck these labs in months.  MGUS (monoclonal gammopathy of undetermined significance) You have some mildly elevated abnormal protein in your blood, but this does not appear to be a cancerous condition at this time. You also have a spot on the bones of your left arm.  Will recheck this in 3 months to make sure that it is stable and does not appear to be cancer. Will recheck your labs in 6 months.  FOLLOW-UP APPOINTMENT: 6 months  ** Thank you for trusting me with your healthcare!  I strive to provide all of my patients with quality care at each visit.  If you receive a survey for this visit, I would be so grateful to you for taking the time to provide feedback.  Thank you in advance!  ~ Elijah Phommachanh                                        Dr. Mickiel Davonna Pleasant Barefoot, PA-C       Delon Hope, NP   - - - - - - - - - - - - - - - - - -     Thank you for choosing [facility] at [facility] to provide  your oncology and hematology care.  To afford each patient quality time with our provider, please arrive at least 15 minutes before your scheduled appointment time.   If you have a lab appointment with the Cancer Center please come in thru the Main Entrance and check in at the main information desk.  You need to re-schedule your appointment should you arrive 10 or more minutes late.  We strive to give you quality time with our providers, and arriving late affects you and other patients whose appointments are after yours.  Also, if you no show three or more times for appointments you may be dismissed from the clinic at the providers discretion.     Again, thank you for choosing [facility].  Our hope is that these requests will decrease the amount of time that you wait before being seen by our physicians.       _____________________________________________________________  Should you have questions after your visit to [facility], please contact our office at [phone] and follow the prompts.  Our  office hours are 8:00 a.m. and 4:30 p.m. Monday - Friday.  Please note that voicemails left after 4:00 p.m. may not be returned until the following business day.  We are closed weekends and major holidays.  You do have access to a nurse 24-7, just call the main number to the clinic [phone] and do not press any options, hold on the line and a nurse will answer the phone.    For prescription refill requests, have your pharmacy contact our office and allow 72 hours.

## 2024-09-01 ENCOUNTER — Ambulatory Visit: Admitting: Urology

## 2024-09-19 ENCOUNTER — Telehealth: Payer: Self-pay

## 2024-09-19 NOTE — Telephone Encounter (Signed)
 Hydrocodone -Acetaminophen  7.5/325 MG  Qty 50 Tablets Take 1 tablet by mouth every 6 (six) hours as needed for moderate pain (pain score 4-6).   PATIENT USES [facility].

## 2024-09-20 ENCOUNTER — Other Ambulatory Visit: Payer: Self-pay | Admitting: Orthopedic Surgery

## 2024-09-20 DIAGNOSIS — G894 Chronic pain syndrome: Secondary | ICD-10-CM

## 2024-09-20 DIAGNOSIS — G8929 Other chronic pain: Secondary | ICD-10-CM

## 2024-09-20 MED ORDER — HYDROCODONE-ACETAMINOPHEN 7.5-325 MG PO TABS: 1.0000 | ORAL_TABLET | Freq: Four times a day (QID) | ORAL | 0 refills | Status: AC | PRN

## 2024-09-20 NOTE — Progress Notes (Signed)
 Meds ordered this encounter  Medications   HYDROcodone -acetaminophen  (NORCO) 7.5-325 MG tablet    Sig: Take 1 tablet by mouth every 6 (six) hours as needed for moderate pain (pain score 4-6).    Dispense:  50 tablet    Refill:  0

## 2024-09-29 ENCOUNTER — Ambulatory Visit: Admitting: Orthopedic Surgery

## 2024-09-29 ENCOUNTER — Encounter: Payer: Self-pay | Admitting: Orthopedic Surgery

## 2024-09-29 DIAGNOSIS — G8929 Other chronic pain: Secondary | ICD-10-CM

## 2024-09-29 DIAGNOSIS — G894 Chronic pain syndrome: Secondary | ICD-10-CM

## 2024-09-29 NOTE — Progress Notes (Signed)
" ° ° °  09/29/2024   Chief Complaint  Patient presents with   Wrist Pain    Right     Encounter Diagnoses  Name Primary?   Encounter for chronic pain management Yes   Chronic pain disorder     What pharmacy do you use ? ____[facility] _______________________  DOI/DOS/ Date:    Did you get better, worse or no change (Answer below)   Unchanged      "

## 2024-09-29 NOTE — Progress Notes (Signed)
" ° °  Patient: Bradley Bostelman           Date of Birth: 1951/07/07           MRN: [id number] Visit Date: 09/29/2024 Requested by: No referring provider defined for this encounter. PCP: Vick Lurie, FNP  Encounter Diagnoses  Name Primary?   Encounter for chronic pain management Yes   Chronic pain disorder     Assessment and plan:  Dale is here for encounter for chronic pain management currently on Norco.  He says his pain medication works well no side effects.  It helps him to perform his routine activities of daily living and improves his gait  No orders of the defined types were placed in this encounter.    Chief Complaint  Patient presents with   Wrist Pain    Right    Recommend 10-month follow-up continue current medication management "

## 2024-10-25 ENCOUNTER — Encounter: Admitting: Orthopedic Surgery

## 2024-11-03 ENCOUNTER — Ambulatory Visit: Admitting: Urology

## 2024-12-28 ENCOUNTER — Ambulatory Visit: Admitting: Orthopedic Surgery

## 2025-02-20 ENCOUNTER — Inpatient Hospital Stay

## 2025-02-27 ENCOUNTER — Inpatient Hospital Stay: Admitting: Physician Assistant
